# Patient Record
Sex: Male | Born: 1945 | Race: White | Hispanic: No | Marital: Married | Smoking: Former smoker
Health system: Southern US, Academic
[De-identification: ages and names within clinical notes are randomized; demographics above are authoritative.]

## PROBLEM LIST (undated history)

## (undated) VITALS — BP 125/66 | HR 80 | Temp 98.00000°F | Resp 18 | Ht 72.0 in | Wt 209.0 lb

## (undated) DIAGNOSIS — S065XAA Traumatic subdural hemorrhage with loss of consciousness status unknown, initial encounter: Secondary | ICD-10-CM

## (undated) DIAGNOSIS — E119 Type 2 diabetes mellitus without complications: Secondary | ICD-10-CM

## (undated) DIAGNOSIS — E785 Hyperlipidemia, unspecified: Secondary | ICD-10-CM

## (undated) DIAGNOSIS — F32A Depression, unspecified: Secondary | ICD-10-CM

## (undated) DIAGNOSIS — I10 Essential (primary) hypertension: Secondary | ICD-10-CM

## (undated) DIAGNOSIS — F329 Major depressive disorder, single episode, unspecified: Secondary | ICD-10-CM

## (undated) DIAGNOSIS — S065X9A Traumatic subdural hemorrhage with loss of consciousness of unspecified duration, initial encounter: Secondary | ICD-10-CM

## (undated) DIAGNOSIS — K5730 Diverticulosis of large intestine without perforation or abscess without bleeding: Secondary | ICD-10-CM

## (undated) DIAGNOSIS — R030 Elevated blood-pressure reading, without diagnosis of hypertension: Secondary | ICD-10-CM

## (undated) DIAGNOSIS — D649 Anemia, unspecified: Secondary | ICD-10-CM

## (undated) DIAGNOSIS — I509 Heart failure, unspecified: Secondary | ICD-10-CM

## (undated) DIAGNOSIS — M545 Low back pain, unspecified: Secondary | ICD-10-CM

## (undated) DIAGNOSIS — I872 Venous insufficiency (chronic) (peripheral): Secondary | ICD-10-CM

## (undated) DIAGNOSIS — Z860100 Personal history of colon polyps, unspecified: Secondary | ICD-10-CM

## (undated) DIAGNOSIS — T7840XA Allergy, unspecified, initial encounter: Secondary | ICD-10-CM

## (undated) DIAGNOSIS — R413 Other amnesia: Secondary | ICD-10-CM

## (undated) DIAGNOSIS — J449 Chronic obstructive pulmonary disease, unspecified: Secondary | ICD-10-CM

## (undated) DIAGNOSIS — E669 Obesity, unspecified: Secondary | ICD-10-CM

## (undated) DIAGNOSIS — Z972 Presence of dental prosthetic device (complete) (partial): Secondary | ICD-10-CM

## (undated) DIAGNOSIS — D689 Coagulation defect, unspecified: Secondary | ICD-10-CM

## (undated) DIAGNOSIS — J189 Pneumonia, unspecified organism: Secondary | ICD-10-CM

## (undated) DIAGNOSIS — M1990 Unspecified osteoarthritis, unspecified site: Secondary | ICD-10-CM

## (undated) DIAGNOSIS — F99 Mental disorder, not otherwise specified: Secondary | ICD-10-CM

## (undated) DIAGNOSIS — K219 Gastro-esophageal reflux disease without esophagitis: Secondary | ICD-10-CM

## (undated) DIAGNOSIS — F419 Anxiety disorder, unspecified: Secondary | ICD-10-CM

## (undated) DIAGNOSIS — G9050 Complex regional pain syndrome I, unspecified: Secondary | ICD-10-CM

## (undated) DIAGNOSIS — K08109 Complete loss of teeth, unspecified cause, unspecified class: Secondary | ICD-10-CM

## (undated) DIAGNOSIS — I714 Abdominal aortic aneurysm, without rupture, unspecified: Secondary | ICD-10-CM

## (undated) HISTORY — DX: Gastro-esophageal reflux disease without esophagitis: K21.9

## (undated) HISTORY — DX: Obesity, unspecified: E66.9

## (undated) HISTORY — PX: TONSILLECTOMY: SUR1361

## (undated) HISTORY — DX: Personal history of colonic polyps: Z86.010

## (undated) HISTORY — PX: OTHER SURGICAL HISTORY: SHX169

## (undated) HISTORY — DX: Essential (primary) hypertension: I10

## (undated) HISTORY — PX: COLONOSCOPY: SHX174

## (undated) HISTORY — DX: Allergy, unspecified, initial encounter: T78.40XA

## (undated) HISTORY — DX: Hyperlipidemia, unspecified: E78.5

## (undated) HISTORY — DX: Other amnesia: R41.3

## (undated) HISTORY — DX: Low back pain: M54.5

## (undated) HISTORY — PX: KNEE ARTHROSCOPY: SUR90

## (undated) HISTORY — DX: Low back pain, unspecified: M54.50

## (undated) HISTORY — DX: Diverticulosis of large intestine without perforation or abscess without bleeding: K57.30

## (undated) HISTORY — PX: COLONOSCOPY W/ POLYPECTOMY: SHX1380

## (undated) HISTORY — DX: Personal history of colon polyps, unspecified: Z86.0100

## (undated) HISTORY — DX: Elevated blood-pressure reading, without diagnosis of hypertension: R03.0

## (undated) HISTORY — DX: Heart failure, unspecified: I50.9

## (undated) HISTORY — DX: Chronic obstructive pulmonary disease, unspecified: J44.9

## (undated) HISTORY — PX: BACK SURGERY: SHX140

## (undated) HISTORY — DX: Coagulation defect, unspecified: D68.9

## (undated) HISTORY — DX: Anemia, unspecified: D64.9

## (undated) HISTORY — DX: Venous insufficiency (chronic) (peripheral): I87.2

## (undated) HISTORY — DX: Unspecified osteoarthritis, unspecified site: M19.90

## (undated) HISTORY — PX: PAIN PUMP IMPLANTATION: SHX330

## (undated) HISTORY — DX: Traumatic subdural hemorrhage with loss of consciousness status unknown, initial encounter: S06.5XAA

## (undated) HISTORY — DX: Traumatic subdural hemorrhage with loss of consciousness of unspecified duration, initial encounter (CMS HCC): S06.5X9A

## (undated) HISTORY — PX: HX APPENDECTOMY: SHX54

## (undated) HISTORY — DX: Depression, unspecified: F32.A

## (undated) HISTORY — PX: HX CERVICAL SPINE SURGERY: 2100001197

## (undated) HISTORY — DX: Type 2 diabetes mellitus without complications (CMS HCC): E11.9

---
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
## 2000-11-09 HISTORY — PX: OTHER SURGICAL HISTORY: SHX169

## 2000-11-15 ENCOUNTER — Encounter: Payer: Self-pay | Admitting: General Surgery

## 2000-11-15 ENCOUNTER — Encounter: Admission: RE | Admit: 2000-11-15 | Discharge: 2000-11-15 | Payer: Self-pay | Admitting: General Surgery

## 2000-11-16 ENCOUNTER — Encounter (INDEPENDENT_AMBULATORY_CARE_PROVIDER_SITE_OTHER): Payer: Self-pay | Admitting: *Deleted

## 2000-11-16 ENCOUNTER — Ambulatory Visit (HOSPITAL_BASED_OUTPATIENT_CLINIC_OR_DEPARTMENT_OTHER): Admission: RE | Admit: 2000-11-16 | Discharge: 2000-11-16 | Payer: Self-pay | Admitting: General Surgery

## 2001-07-09 ENCOUNTER — Encounter: Payer: Self-pay | Admitting: Emergency Medicine

## 2001-07-09 ENCOUNTER — Emergency Department (HOSPITAL_COMMUNITY): Admission: EM | Admit: 2001-07-09 | Discharge: 2001-07-09 | Payer: Self-pay | Admitting: Emergency Medicine

## 2002-05-12 HISTORY — PX: OTHER SURGICAL HISTORY: SHX169

## 2002-05-29 ENCOUNTER — Encounter: Payer: Self-pay | Admitting: Specialist

## 2002-05-30 ENCOUNTER — Inpatient Hospital Stay (HOSPITAL_COMMUNITY): Admission: AD | Admit: 2002-05-30 | Discharge: 2002-05-31 | Payer: Self-pay | Admitting: Specialist

## 2002-07-22 ENCOUNTER — Encounter: Payer: Self-pay | Admitting: Specialist

## 2002-07-22 ENCOUNTER — Encounter: Admission: RE | Admit: 2002-07-22 | Discharge: 2002-07-22 | Payer: Self-pay | Admitting: Specialist

## 2003-01-08 ENCOUNTER — Encounter: Admission: RE | Admit: 2003-01-08 | Discharge: 2003-01-08 | Payer: Self-pay | Admitting: Neurosurgery

## 2005-04-25 ENCOUNTER — Ambulatory Visit: Payer: Self-pay | Admitting: Internal Medicine

## 2005-04-28 ENCOUNTER — Encounter (INDEPENDENT_AMBULATORY_CARE_PROVIDER_SITE_OTHER): Payer: Self-pay | Admitting: *Deleted

## 2005-04-28 ENCOUNTER — Ambulatory Visit: Payer: Self-pay | Admitting: Internal Medicine

## 2005-05-09 ENCOUNTER — Ambulatory Visit: Payer: Self-pay | Admitting: Pulmonary Disease

## 2005-08-26 ENCOUNTER — Ambulatory Visit: Payer: Self-pay | Admitting: Pulmonary Disease

## 2005-12-27 ENCOUNTER — Ambulatory Visit: Payer: Self-pay | Admitting: Pulmonary Disease

## 2006-03-23 ENCOUNTER — Emergency Department (HOSPITAL_COMMUNITY): Admission: EM | Admit: 2006-03-23 | Discharge: 2006-03-23 | Payer: Self-pay | Admitting: Emergency Medicine

## 2006-04-09 ENCOUNTER — Emergency Department (HOSPITAL_COMMUNITY): Admission: EM | Admit: 2006-04-09 | Discharge: 2006-04-09 | Payer: Self-pay | Admitting: Emergency Medicine

## 2006-04-17 ENCOUNTER — Ambulatory Visit: Payer: Self-pay | Admitting: Pulmonary Disease

## 2006-05-12 HISTORY — PX: OTHER SURGICAL HISTORY: SHX169

## 2006-05-17 ENCOUNTER — Emergency Department (HOSPITAL_COMMUNITY): Admission: EM | Admit: 2006-05-17 | Discharge: 2006-05-18 | Payer: Self-pay | Admitting: Emergency Medicine

## 2006-05-22 ENCOUNTER — Ambulatory Visit: Payer: Self-pay | Admitting: Pulmonary Disease

## 2006-05-25 ENCOUNTER — Inpatient Hospital Stay (HOSPITAL_COMMUNITY): Admission: RE | Admit: 2006-05-25 | Discharge: 2006-05-26 | Payer: Self-pay | Admitting: Neurosurgery

## 2006-06-05 ENCOUNTER — Ambulatory Visit: Payer: Self-pay | Admitting: Pulmonary Disease

## 2006-06-05 LAB — CONVERTED CEMR LAB
Albumin: 3.4 g/dL — ABNORMAL LOW (ref 3.5–5.2)
Alkaline Phosphatase: 86 units/L (ref 39–117)
Basophils Relative: 0.5 % (ref 0.0–1.0)
Bilirubin, Direct: 0.1 mg/dL (ref 0.0–0.3)
CO2: 35 meq/L — ABNORMAL HIGH (ref 19–32)
Calcium: 9.5 mg/dL (ref 8.4–10.5)
Glucose, Bld: 107 mg/dL — ABNORMAL HIGH (ref 70–99)
HCT: 38.6 % — ABNORMAL LOW (ref 39.0–52.0)
Hemoglobin: 12.9 g/dL — ABNORMAL LOW (ref 13.0–17.0)
MCV: 83.4 fL (ref 78.0–100.0)
Monocytes Absolute: 0.7 10*3/uL (ref 0.2–0.7)
Neutro Abs: 5.8 10*3/uL (ref 1.4–7.7)
Neutrophils Relative %: 60.1 % (ref 43.0–77.0)
Platelets: 336 10*3/uL (ref 150–400)
RBC: 4.63 M/uL (ref 4.22–5.81)
RDW: 13.6 % (ref 11.5–14.6)
Sodium: 144 meq/L (ref 135–145)
Total Protein: 7.2 g/dL (ref 6.0–8.3)

## 2006-06-27 ENCOUNTER — Ambulatory Visit: Payer: Self-pay | Admitting: Pulmonary Disease

## 2006-08-07 ENCOUNTER — Ambulatory Visit: Payer: Self-pay | Admitting: Pulmonary Disease

## 2006-08-29 ENCOUNTER — Ambulatory Visit: Payer: Self-pay | Admitting: Pulmonary Disease

## 2006-08-29 LAB — CONVERTED CEMR LAB
Alkaline Phosphatase: 87 units/L (ref 39–117)
Bilirubin, Direct: 0.1 mg/dL (ref 0.0–0.3)
Calcium: 9.1 mg/dL (ref 8.4–10.5)
Chloride: 102 meq/L (ref 96–112)
Cholesterol: 190 mg/dL (ref 0–200)
GFR calc non Af Amer: 146 mL/min
Glucose, Bld: 99 mg/dL (ref 70–99)
HDL: 36.4 mg/dL — ABNORMAL LOW (ref >39.0)
LDL Cholesterol: 121 mg/dL — ABNORMAL HIGH (ref 0–99)
Potassium: 4.2 meq/L (ref 3.5–5.1)
Total Protein: 7 g/dL (ref 6.0–8.3)

## 2006-12-20 ENCOUNTER — Ambulatory Visit: Payer: Self-pay | Admitting: Pulmonary Disease

## 2007-01-15 ENCOUNTER — Ambulatory Visit: Payer: Self-pay | Admitting: Pulmonary Disease

## 2007-02-22 DIAGNOSIS — E782 Mixed hyperlipidemia: Secondary | ICD-10-CM

## 2007-02-22 DIAGNOSIS — M545 Low back pain, unspecified: Secondary | ICD-10-CM | POA: Insufficient documentation

## 2007-02-22 DIAGNOSIS — D126 Benign neoplasm of colon, unspecified: Secondary | ICD-10-CM | POA: Insufficient documentation

## 2007-02-22 DIAGNOSIS — E1169 Type 2 diabetes mellitus with other specified complication: Secondary | ICD-10-CM | POA: Insufficient documentation

## 2007-04-12 HISTORY — PX: OTHER SURGICAL HISTORY: SHX169

## 2007-04-19 ENCOUNTER — Ambulatory Visit (HOSPITAL_COMMUNITY): Admission: RE | Admit: 2007-04-19 | Discharge: 2007-04-19 | Payer: Self-pay | Admitting: Neurosurgery

## 2008-09-10 ENCOUNTER — Ambulatory Visit: Payer: Self-pay | Admitting: Pulmonary Disease

## 2008-09-12 DIAGNOSIS — I872 Venous insufficiency (chronic) (peripheral): Secondary | ICD-10-CM | POA: Insufficient documentation

## 2008-09-12 DIAGNOSIS — K5730 Diverticulosis of large intestine without perforation or abscess without bleeding: Secondary | ICD-10-CM | POA: Insufficient documentation

## 2008-09-24 ENCOUNTER — Ambulatory Visit: Payer: Self-pay | Admitting: Pulmonary Disease

## 2008-11-19 LAB — CONVERTED CEMR LAB
ALT: 25 units/L (ref 0–53)
CO2: 28 meq/L (ref 19–32)
Chloride: 101 meq/L (ref 96–112)
Cholesterol: 224 mg/dL — ABNORMAL HIGH (ref 0–200)
Creatinine, Ser: 0.8 mg/dL (ref 0.4–1.5)
Eosinophils Relative: 3.6 % (ref 0.0–5.0)
HCT: 41.1 % (ref 39.0–52.0)
HDL: 30.9 mg/dL — ABNORMAL LOW (ref >39.00)
Hemoglobin: 14 g/dL (ref 13.0–17.0)
Lymphocytes Relative: 26 % (ref 12.0–46.0)
MCHC: 34.1 g/dL (ref 30.0–36.0)
MCV: 87.8 fL (ref 78.0–100.0)
Monocytes Relative: 4.8 % (ref 3.0–12.0)
PSA: 0.48 ng/mL (ref 0.10–4.00)
Platelets: 184 10*3/uL (ref 150.0–400.0)
Potassium: 4.2 meq/L (ref 3.5–5.1)
RBC: 4.69 M/uL (ref 4.22–5.81)
RDW: 12.6 % (ref 11.5–14.6)
Sodium: 140 meq/L (ref 135–145)
TSH: 3.18 microintl units/mL (ref 0.35–5.50)
Total CHOL/HDL Ratio: 7
VLDL: 36.6 mg/dL (ref 0.0–40.0)
Vit D, 25-Hydroxy: 15 ng/mL — ABNORMAL LOW (ref 30–89)
WBC: 11.3 10*3/uL — ABNORMAL HIGH (ref 4.5–10.5)

## 2009-01-26 ENCOUNTER — Encounter (INDEPENDENT_AMBULATORY_CARE_PROVIDER_SITE_OTHER): Payer: Self-pay | Admitting: *Deleted

## 2009-02-10 ENCOUNTER — Encounter: Payer: Self-pay | Admitting: Pulmonary Disease

## 2009-03-09 ENCOUNTER — Ambulatory Visit: Payer: Self-pay | Admitting: Pulmonary Disease

## 2009-03-09 DIAGNOSIS — J449 Chronic obstructive pulmonary disease, unspecified: Secondary | ICD-10-CM | POA: Insufficient documentation

## 2009-03-10 LAB — CONVERTED CEMR LAB
Albumin: 4 g/dL (ref 3.5–5.2)
BUN: 9 mg/dL (ref 6–23)
CO2: 29 meq/L (ref 19–32)
Creatinine, Ser: 0.7 mg/dL (ref 0.4–1.5)
Hgb A1c MFr Bld: 7.4 % — ABNORMAL HIGH (ref 4.6–6.5)
LDL Cholesterol: 98 mg/dL (ref 0–99)
Potassium: 4.4 meq/L (ref 3.5–5.1)
Sodium: 139 meq/L (ref 135–145)
Total CHOL/HDL Ratio: 4
Total Protein: 7.9 g/dL (ref 6.0–8.3)
Triglycerides: 94 mg/dL (ref 0.0–149.0)

## 2009-07-02 ENCOUNTER — Encounter: Payer: Self-pay | Admitting: Pulmonary Disease

## 2009-07-27 ENCOUNTER — Encounter: Payer: Self-pay | Admitting: Pulmonary Disease

## 2009-09-08 ENCOUNTER — Ambulatory Visit: Payer: Self-pay | Admitting: Pulmonary Disease

## 2009-09-09 LAB — CONVERTED CEMR LAB
ALT: 35 units/L (ref 0–53)
Alkaline Phosphatase: 86 units/L (ref 39–117)
BUN: 8 mg/dL (ref 6–23)
Basophils Absolute: 0 10*3/uL (ref 0.0–0.1)
Basophils Relative: 0.2 % (ref 0.0–3.0)
Bilirubin, Direct: 0.1 mg/dL (ref 0.0–0.3)
CO2: 31 meq/L (ref 19–32)
GFR calc non Af Amer: 124.97 mL/min (ref >60)
HCT: 39.9 % (ref 39.0–52.0)
Hgb A1c MFr Bld: 6.8 % — ABNORMAL HIGH (ref 4.6–6.5)
Lymphocytes Relative: 30.3 % (ref 12.0–46.0)
Monocytes Relative: 5.5 % (ref 3.0–12.0)
Neutro Abs: 5.4 10*3/uL (ref 1.4–7.7)
Neutrophils Relative %: 61.6 % (ref 43.0–77.0)
PSA: 0.28 ng/mL (ref 0.10–4.00)
Platelets: 234 10*3/uL (ref 150.0–400.0)
RBC: 4.46 M/uL (ref 4.22–5.81)
RDW: 14.1 % (ref 11.5–14.6)
WBC: 8.7 10*3/uL (ref 4.5–10.5)

## 2010-03-08 ENCOUNTER — Ambulatory Visit: Payer: Self-pay | Admitting: Pulmonary Disease

## 2010-03-15 LAB — CONVERTED CEMR LAB
ALT: 31 units/L (ref 0–53)
AST: 59 units/L — ABNORMAL HIGH (ref 0–37)
Bilirubin, Direct: 0.2 mg/dL (ref 0.0–0.3)
Chloride: 97 meq/L (ref 96–112)
Cholesterol: 158 mg/dL (ref 0–200)
GFR calc non Af Amer: 138.8 mL/min (ref >60)
Glucose, Bld: 385 mg/dL — ABNORMAL HIGH (ref 70–99)
Hgb A1c MFr Bld: 10.6 % — ABNORMAL HIGH (ref 4.6–6.5)
Potassium: 4.6 meq/L (ref 3.5–5.1)
Total Bilirubin: 0.7 mg/dL (ref 0.3–1.2)
Total Protein: 7.8 g/dL (ref 6.0–8.3)

## 2010-03-20 DIAGNOSIS — I10 Essential (primary) hypertension: Secondary | ICD-10-CM | POA: Insufficient documentation

## 2010-04-09 ENCOUNTER — Encounter: Payer: Self-pay | Admitting: Internal Medicine

## 2010-05-10 ENCOUNTER — Encounter: Payer: Self-pay | Admitting: Pulmonary Disease

## 2010-05-11 NOTE — Letter (Signed)
Summary: Care Consideration for eye exam/Health Smart  Care Consideration for eye exam/Health Smart   Imported By: Sherian Rein 08/24/2009 11:40:45  _____________________________________________________________________  External Attachment:    Type:   Image     Comment:   External Document

## 2010-05-11 NOTE — Assessment & Plan Note (Signed)
Summary: rov 6 months///kp   CC:  6 month ROV & review of mult medical problems....  History of Present Illness: 65 y/o WM here for a follow up visit... he has multiple medical problems including COPD (former heavy smoker);  HBP;  Hyperlipidemia & DM;  Obesity;  Divertics & Polyps;  LBP, RSD, & chr pain syndrome...   ~  Sep 08, 2009:  last OV his FBS=145 & A1c=7.4 so Glimepiride 1mg  was added to his Metformin Bid... inexplicably he never refilled the Glimep & he never refilled the Simvastatin 40mg  either (he thinks it prob the pharm fault, but I called them & there are refills avail- he just never called for the refills)...  still not really on diet & not able to exercise, weight 270# w/o change...  we decided to recheck fasting blood work & start back on meds as indicated...   **NOTE: we received letter from [facility] program- we will ask them to aide in medication compliance, diet & exercise program...   ~  March 08, 2010: his CC remains his chr pain for which he sees DrRauch pain clinic every month w/ 8 meds and pain pump in spine & TENS implanted as well... he states that his breathing is OK;  notes his BP is only elev when he is in pain & denies CP/ angina etc;  BS & A1c are way out of control & we are adding Glimep + max Metformin;  we reviewed diet + exercise profram & he must lose weight to keep himself from having to start on insulin... OK Flu vaccine today.    Current Problems:   COPD (ICD-496) - former 3ppd smoker, quit 2003... denies cough, phlegm, change in SOB, etc...  ~  CXR 6/10 showed COPD, NAD...  HYPERTENSION, BORDERLINE (ICD-401.9) - his BP has been elev intermittently in the past, but he states just when he is in pain... DrRauck has him on CLONIDINE 0.1mg  Bid which obviously also helps his pressure...  ~  5/11:  BP= 116/70 & he denies HA, visual changes, CP, palipit, syncope, ch in dyspnea, edema, etc...  ~  11/11:  BP= 160/90 but he is having considerable pain  recently he says & he doesn't want additional meds.  VENOUS INSUFFICIENCY (ICD-459.81) - hx chr venous insuffic and cellulitis in the past... no recent soft tissue infections... he follows a low sodium diet, elevates legs, wears support hose when necessary...  HYPERLIPIDEMIA (ICD-272.4) - supposed to be on SIMVASTATIN 40mg /d, but he has been filling this irregularly... prev on Vytorin 10-40 but he is very erratic at filling and taking his medication as well... we discussed low chol/ low fat diet, incr exercise, get weight down!  ~  FLP 5/08 showed TChol 190, TG 164, HDL 36, LDL 121... ?taking Vytor10-40?  ~  FLP 6/10 showed TChol 224, TG 183, HDL 31, LDL 176... rec> Rx Simva40.  ~  FLP 11/10 on Simva40 showed TChol 159, TG 94, HDL 43, LDL 98... continue same.  ~  pt stopped filling his perscription for Simva40  ~1/11...  ~  FLP 5/11 showed TChol 240, TG 205, HDL 35, LDL 178... needs med> restart Simva40 & stay on it!  ~  FLP 11/11 showed TChol 158, TG 181, HDL 32, LDL 90  DIABETES MELLITUS (ICD-250.00) - on METFORMIN 500mg Bid & he has been filling this Rx & taking it more regularly... new Rx for Glimepiride written 11/10 but pt stopped this ?why  ~1/11... we discussed low carb diet,  exercise & wt reduction!  ~  labs 2/08 showed BS= 107, A1c= 6.4  ~  labs 5/08 showed BS= 99, A1c= 6.2  ~  labs 6/10 showed BS= 127, A1c= 6.6.[name]. rec> take med regularly!  ~  labs 11/10 showed BS= 145, A1c= 7.4.[name]. rec> add Glimep 1mg Qam (only took it for 2-46mo).   ~  labs 5/11 showed BS= 118, A1c= 6.8.[name]. if he can lose weight he can avoid more meds...  ~  labs 11/11 showed BS= 385, A1c= 10.6.Marland KitchenMarland Kitchen ?what happened? rec> incr Metform500-2Bid, Add Glimep2mg /d.  OBESITY (ICD-278.00) - he is not dieting/ exercising/ or trying to lose weight in any fashion!!!  We discussed weight reducing diet strategies and exercise program that he should be able to perform.  ~  last weight <230# was 1995 @ 226#... 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  ~  last colonoscopy 1/07 by DrPerry showed divertics, 1mm polyp (not retrieved)... f/u planned 10yrs.  Hx of PROSTATITIS, ACUTE (ICD-601.0) - hx acute prostatitis treated in the 1990's by DrPeterson...  ~  labs 6/10 showed PSA= 0.48  ~  labs 5/11 showed PSA= 0.28  ~  11/11: notes some irritation on penis- Rx Lotrisone cream trial.  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 followed by Vito Berger al at the [facility]: he has a spinal cord stimulator, and subcut pain pump w/ Fentanyl (reaction to MS)...   ~  2/04:  eval by Tora Perches w/ HNP L3-4 right w/ spinal stenosis, lateral recessed stenosis- had microdiscectomy & decompression.  ~  2/08:  Myelogram by DrJJenkins w/ multilevel cervical DDD & stenosis- s/p ant cerv discectomy & fusion w/ plating at C3-4.  ~  6/10: current meds from DrRauch includes--- Percocet 10mg - Tid; Tramadol 50mg - 1 to 2 Qid; Celebrex 200mg Bid; Lidoderm patches; Lyrica 150mg Bid; Cymbalta 60mg /d; Flexeril 10mg Tid; Clonidine0.1mg - 1.2 Bid for RSD...  ~  11/10: pt reports Myelogram & CT scan showed 5 discs & spinal stenosis... he is to see Neurosurg soon.  ~  5/11:  he reports monthly OV's w/ Pain Management & continues on 8 meds listed...  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 Father died age 68 from lung cancer Mother alive age 69 Frederich Montilla) w/ pulm fibrosis, divertics, DJD... 4 Siblings:  Bro= Orion Katrinka Blazing, Sister= [facility]  Social History: Reviewed history from 03/09/2009 and no changes required. Married, wife= Clydie Braun, 38 yrs. 2 Children Ex-smoker, quit 2003 w/ 3ppd habit Social alcohol retired [employer] forces logistics/support/administrative officer Packs/Day:  3.0  Review of Systems      See HPI       The patient complains of dyspnea on exertion, peripheral edema, muscle weakness, and difficulty walking.  The patient denies anorexia, fever, weight loss, weight gain, vision loss, decreased hearing, hoarseness, chest pain, syncope, prolonged cough, headaches, hemoptysis, abdominal pain, melena, hematochezia, severe indigestion/heartburn, hematuria, incontinence, suspicious skin lesions, transient blindness, depression, unusual weight change, abnormal bleeding, enlarged lymph nodes, and angioedema.    Vital Signs:  Patient profile:   65 year old male Height:      67 inches Weight:      262.25 pounds BMI:     41.22 O2 Sat:      95 % on Room  air Temp:     98.6 degrees F oral Pulse rate:   114 / minute BP sitting:   160 / 90  (right arm) Cuff size:   regular  Vitals Entered By: Randell Loop CMA (March 08, 2010 11:39 AM)  O2 Sat at Rest %:  95 O2 Flow:  Room air CC: 6 month ROV & review of mult medical problems... Is Patient Diabetic? Yes Pain Assessment Patient in pain? yes      Onset of pain  severe back pain today Comments no changes in meds today   Physical Exam  Additional Exam:  WD, Obese, 65 y/o WM in NAD... GENERAL:  Alert & oriented; pleasant & cooperative. HEENT:  [place]/AT, EOM-wnl, PERRLA, EACs-clear, TMs-wnl, NOSE-clear, THROAT-clear & wnl. NECK:  Supple w/ decrROM & ant cerv scar; no JVD; normal carotid impulses w/o bruits; no thyromegaly or nodules palpated; no lymphadenopathy. CHEST:  Clear to P & A; without wheezes/ rales/ or rhonchi heard... HEART:  Regular Rhythm; without murmurs/ rubs/ or gallops detected... ABDOMEN:  Obese, soft & nontender; decr bowel sounds; no organomegaly or masses palpated... EXT: without deformities, mild arthritic changes; no varicose veins/ +venous insuffic/ tr edema. NEURO:  CN's intact; motor testing normal; sensory testing normal; abn gait & balance fair... Ambulates w/ cane, scar of prev Lumbar surg... DERM:  Mod lipoma in right antecubital area...    MISC. Report  Procedure date:  03/08/2010  Findings:      DATA REVIEWED:  ~  Old EMR notes reviewed & lab cumulative summary sheet noted...  ~  Labs 03/08/10 reviewed w/ the pt>>>    Impression & Recommendations:  Problem # 1:  COPD (ICD-496) He has a heavy smoking hx but not very symptomatic due to his pain & sedentary nature...  Problem # 2:  HYPERTENSION, BORDERLINE (ICD-401.9) BP is sl elev w/ pain>  on Clonidine & we discussed diet Rx if he wants to avoid additional meds... His updated medication list for this problem includes:    Clonidine Hcl 0.1 Mg Tabs (Clonidine hcl) .[name]... Take 1/2 tablet by  mouth two times a day  Orders: TLB-A1C / Hgb A1C (Glycohemoglobin) (83036-A1C) TLB-Lipid Panel (80061-LIPID) TLB-Hepatic/Liver Function Pnl (80076-HEPATIC)  Problem # 3:  HYPERLIPIDEMIA (ICD-272.4) Encouraged to take med every day, & get on diet + exercise program... His updated medication  list for this problem includes:    Simvastatin 40 Mg Tabs (Simvastatin) .[name]... Take one tablet by mouth at bedtime  Problem # 4:  DIABETES MELLITUS (ICD-250.00) Control is suddenly very poor... his Metformin is incr & Glimep added... His updated medication list for this problem includes:    Metformin Hcl 500 Mg Tabs (Metformin hcl) .[name]... Take 2  tablet by mouth two times a day    Glimepiride 2 Mg Tabs (Glimepiride) .[name]... Take one tablet by mouth every morning  Problem # 5:  OBESITY (ICD-278.00) As noted>  diet + exercise are the keys to success...  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  Problem # 7:  LOW BACK PAIN SYNDROME (ICD-724.2) Followed by DrRauck pain clinic... His updated medication list for this problem includes:    Percocet 10-325 Mg Tabs (Oxycodone-acetaminophen) .[name]... Take one tablet by mouth four time per day    Tramadol Hcl 50 Mg Tabs (Tramadol hcl) .[name]... Take 1 to 2 tablets by mouth four times per day    Celebrex 200 Mg Caps (Celecoxib) .[name]... Take one capsule by mouth two times a day    Cyclobenzaprine Hcl 10 Mg Tabs (Cyclobenzaprine hcl) .[name]... Take one tablet by mouth three times a day  Problem # 8:  Other medical problems as noted... OK lu shot today...  Complete Medication List: 1)  Clonidine Hcl 0.1 Mg Tabs (Clonidine hcl) .... Take 1/2 tablet by mouth two times a day 2)  Metformin Hcl 500 Mg Tabs (Metformin hcl) .... Take 2  tablet by mouth two times a day 3)  Simvastatin 40 Mg Tabs (Simvastatin) .... Take one tablet by mouth at bedtime 4)  Cymbalta 60 Mg Cpep  (Duloxetine hcl) .... Take 1 tablet by mouth once a day 5)  Lyrica 150 Mg Caps (Pregabalin) .... Take 1 tablet by mouth two times a day 6)  Lidoderm 5 % Ptch (Lidocaine) .... Use as directed on for 12 hours, off for 12 hours 7)  Percocet 10-325 Mg Tabs (Oxycodone-acetaminophen) .... Take one tablet by mouth four time per day 8)  Tramadol Hcl 50 Mg Tabs (Tramadol hcl) .... Take 1 to 2 tablets by mouth four times per day 9)  Celebrex 200 Mg Caps (Celecoxib) .... Take one capsule by mouth two times a day 10)  Cyclobenzaprine Hcl 10 Mg Tabs (Cyclobenzaprine hcl) .... Take one tablet by mouth three times a day 11)  Vitamin D 1000 Unit Tabs (Cholecalciferol) .... Take 2 tablets by mouth daily 12)  Clotrimazole-betamethasone 1-0.05 % Crea (Clotrimazole-betamethasone) .... Apply as directed... 13)  Glimepiride 2 Mg Tabs (Glimepiride) .... Take one tablet by mouth every morning  Other Orders: Influenza Vaccine NON MCR (16109) TLB-BMP (Basic Metabolic Panel-BMET) (80048-METABOL)  Patient Instructions: 1)  Today we updated your med list- see below.... 2)  Continue your current meds the same for now... 3)  We did your follow up FASTING blood work today... please call the "phone tree" in a few days for your lab results.Marland KitchenMarland Kitchen 4)  We also gave you the 2011 Flu vaccine... 5)  Keep up the good work w/ weight reduction!!! 6)  Call for any questions.Marland KitchenMarland Kitchen 7)  Please schedule a follow-up appointment in 6 months. Prescriptions: CLOTRIMAZOLE-BETAMETHASONE 1-0.05 % CREA (CLOTRIMAZOLE-BETAMETHASONE) apply as directed...  #1 large tube x 5   Entered and Authorized by:   Michele Mcalpine MD   Signed  by:   Michele Mcalpine MD on 03/08/2010   Method used:   Print then Give to Patient   RxID:   [id number]    Immunizations Administered:  Influenza Vaccine # 1:    Vaccine Type: Fluvax Non-MCR    Site: left deltoid    Mfr: GlaxoSmithKline    Dose: 0.5 ml    Route: IM    Given by: Randell Loop CMA    Exp. Date:  10/09/2010    Lot #: [id number]    VIS given: 11/03/09 version given March 08, 2010.  Flu Vaccine Consent Questions:    Do you have a history of severe allergic reactions to this vaccine? no    Any prior history of allergic reactions to egg and/or gelatin? no    Do you have a sensitivity to the preservative Thimersol? no    Do you have a past history of Guillan-Barre Syndrome? no    Do you currently have an acute febrile illness? no    Have you ever had a severe reaction to latex? no    Vaccine information given and explained to patient? yes

## 2010-05-11 NOTE — Assessment & Plan Note (Signed)
Summary: 6 months/apc   CC:  6 month ROV & review of mult medical problems....  History of Present Illness: 65 y/o WM here for a follow up visit... he has multiple medical problems as noted below...     ~  Jun10:  he is followed regularly in the Pain Clinic by [facility], and doesn't like coming to see so many doctors, he says... last seen her 10/08 & we follow him for DM- supposed to be on Metformin 500Bid, and Hyperchol- supposed to be on Vytorin 10-40... he is not on any kind of appropriate diet despite numerous attempts at diet control in the past (weight up 25# to 278#)... he tells me that he is taking his meds regularly but when I called all the prev pharmacies listed his last refills of these 2 meds [facility] [address]...  his main concern is his chr pain syndrome managed by DrRauck's pain clinic and he sees him every month...  ~  Nov10:  he just had Myel[facility] & he reports 5 discs & spinal stenosis- he is going to a Neurosurg to see if there is anything they can do... still on 8 meds from the pain clinic... he was started on Simva40 after last OV & states taking it regularly, continues on MetforminBid & wt down 5#...    ~  Sep 08, 2009:  last OV his FBS=145 & A1c=7.4 so Glimepiride 1mg  was added to his Metformin Bid... inexplicably he never refilled the Glimep & he never refilled the Simvastatin 40mg  either (he thinks it prob the pharm fault, but I called them & there are refills avail- he just never called for the refills)...  still not really on diet & not able to exercise, weight 270# w/o change...  we decided to recheck fasting blood work & start back on meds as indicated...   **NOTE: we received letter from [facility] program- we will ask them to aide in medication compliance, diet & exercise program...    Current Problems:   COPD (ICD-496) - former 3ppd smoker, quit 2003... denies cough, phlegm, change in SOB, etc...  ~  CXR 6/10 showed COPD,  NAD...  VENOUS INSUFFICIENCY (ICD-459.81) - hx chr venous insuffic and cellulitis in the past... no recent soft tissue infections... he follows a low sodium diet, elevates legs, wears support hose when necessary...  HYPERLIPIDEMIA (ICD-272.4) - supposed to be on SIMVASTATIN 40mg /d, but he hasn't filled this in 4+months per pharmacy phone call today... prev on Vytorin 10-40 but he is very erratic at filling and taking his medication... we discussed low chol/ low fat diet, incr exercise, get weight down!  ~  FLP 5/08 showed TChol 190, TG 164, HDL 36, LDL 121... ?taking Vytor10-40?  ~  FLP 6/10 showed TChol 224, TG 183, HDL 31, LDL 176... rec> Rx Simva40.  ~  FLP 11/10 on Simva40 showed TChol 159, TG 94, HDL 43, LDL 98... continue same.  ~  pt stopped filling his perscription for Simva40  ~1/11...  ~  FLP 5/11 showed TChol 240, TG 205, HDL 35, LDL 178... needs med> restart simva40 & stay on it!  DIABETES MELLITUS (ICD-250.00) - on METFORMIN 500mg Bid & he has been filling this Rx & taking it more regularly... new Rx for Glimepiride written 11/10 but pt stopped this ?why  ~1/11... we discussed low carb diet, exercise & wt reduction!  ~  labs 2/08 showed BS= 107, A1c= 6.4  ~  labs  5/08 showed BS= 99, A1c= 6.2  ~  labs 6/10 showed BS= 127, A1c= 6.6.[name]. rec> take med regularly!  ~  labs 11/10 showed BS= 145, A1c= 7.4.[name]. rec> add Glimep 1mg Qam (only took it for 2-19mo).   ~  labs 5/11 showed BS= 118, A1c= 6.8.[name]. if he can lose weight he can avoid more meds...  OBESITY (ICD-278.00) - he is not dieting/ exercising/ or trying to lose weight in any fashion!!!  We discussed weight reducing diet strategies and exercise program that he should be able to perform.  ~  last weight <230# was 1995 @ 226#... range over 15 yrs= 238-278#  ~  weight 5/08 = 239#  ~  weight 10/08 = 253#  ~  weight 6/10 = 278#  ~  weight 11/10 = 273#  ~  weight 5/11 = 270#  DIVERTICULOSIS OF COLON (ICD-562.10),  COLONIC POLYPS  (ICD-211.3),  CONSTIPATION (ICD-564.00) - he is instructed to take MIRALAX Bid & SENAKOT-S 2Qhs for his narcotic induced constipation problem...  ~  last colonoscopy 1/07 by DrPerry showed divertics, 1mm polyp (not retrieved)... f/u planned 40yrs.  Hx of PROSTATITIS, ACUTE (ICD-601.0) - hx acute prostatitis treated in the 1990's by DrPeterson...  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 followed by Vito Berger al at the [facility]: he has a spinal cord stimulator, and subcut pain pump w/ Fentanyl (reaction to MS)...   ~  2/04:  eval by Tora Perches w/ HNP L3-4 right w/ spinal stenosis, lateral recessed stenosis- had microdiscectomy & decompression.  ~  2/08:  Myelogram by DrJJenkins w/ multilevel cervical DDD & stenosis- s/p ant cerv discectomy & fusion w/ plating at C3-4.  ~  6/10: current meds from DrRauch includes--- Percocet 10mg - Tid; Tramadol 50mg - 1 to 2 Qid; Celebrex 200mg Bid; Lidoderm patches; Lyrica 150mg Bid; Cymbalta 60mg /d; Flexeril 10mg Tid; Clonidine0.1mg - 1.2 Bid for RSD...  ~  11/10: pt reports Myelogram & CT scan showed 5 discs & spinal stenosis... he is to see Neurosurg soon.  ~  5/11:  he reports monthly OV's w/ Pain Management & continues on 8 meds listed...  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   Allergies: 1)  Codeine Phosphate (Codeine Phosphate)  Comments:  Nurse/Medical Assistant: The patient's medications and allergies were reviewed with the patient and were updated in the Medication and Allergy Lists.  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 Father died age 75 from lung cancer Mother alive age 65 Gregroy Dombkowski) w/ pulm fibrosis, divertics, DJD... 4 Siblings:  Bro= Dola Factor, Sister= [facility]...  Social History: Reviewed history from 03/09/2009 and no changes required. Married, wife= Clydie Braun, 38 yrs. 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  The patient denies anorexia, fever, weight loss, weight gain, vision loss, hoarseness, chest pain, syncope, peripheral edema, prolonged cough, hemoptysis, abdominal pain, melena, hematochezia, severe indigestion/heartburn, hematuria, incontinence, muscle weakness, suspicious skin lesions, transient blindness, depression, unusual weight change, abnormal bleeding, enlarged lymph nodes, and angioedema.    Vital Signs:  Patient profile:   65 year old male Height:      67 inches Weight:      270 pounds O2 Sat:      95 % on Room air Temp:     98.0 degrees F oral Pulse rate:   110 / minute BP sitting:   116 / 70  (left arm) Cuff size:   regular  Vitals Entered By: Randell Loop CMA (Sep 08, 2009 11:18 AM)  O2 Sat at Rest %:  95 O2 Flow:  Room air CC: 6 month ROV & review of mult medical problems... Is Patient Diabetic?  No Pain Assessment Patient in pain? yes      Comments meds updated today   Physical Exam  Additional Exam:  WD, Obese, 65 y/o WM in NAD... GENERAL:  Alert & oriented; pleasant & cooperative. HEENT:  [place]/AT, EOM-wnl, PERRLA, EACs-clear, TMs-wnl, NOSE-clear, THROAT-clear & wnl. NECK:  Supple w/ decrROM & ant cerv scar; no JVD; normal carotid impulses w/o bruits; no thyromegaly or nodules palpated; no lymphadenopathy. CHEST:  Clear to P & A; without wheezes/ rales/ or rhonchi heard... HEART:  Regular Rhythm; without murmurs/ rubs/ or gallops detected... ABDOMEN:  Obese, soft & nontender; decr bowel sounds; no organomegaly or masses palpated... EXT: without deformities, mild arthritic changes; no varicose veins/ +venous insuffic/ tr edema. NEURO:  CN's intact; motor testing normal; sensory testing normal; abn gait & balance fair... Ambulates w/ cane, scar of prev Lumbar surg... DERM:  Mod lipoma in right antecubital area...    MISC. Report  Procedure date:  09/08/2009  Findings:      BMP (METABOL)   Sodium                    140 mEq/L                   135-145   Potassium                 5.1 mEq/L                   3.5-5.1   Chloride                  100 mEq/L                   96-112   Carbon Dioxide            31 mEq/L                    19-32   Glucose              [H]  118 mg/dL                   16-10   BUN                       8 mg/dL  6-23   Creatinine                0.7 mg/dL                   6.2-1.3   Calcium                   9.4 mg/dL                   0.8-65.7   GFR                       124.97 mL/min               >60  Hepatic/Liver Function Panel (HEPATIC)   Total Bilirubin      [L]  0.2 mg/dL                   8.4-6.9   Direct Bilirubin          0.1 mg/dL                   6.2-9.5   Alkaline Phosphatase      86 U/L                      39-117   AST                  [H]  47 U/L                      0-37   ALT                       35 U/L                       0-53   Total Protein             7.3 g/dL                    2.8-4.1   Albumin                   4.0 g/dL                    3.2-4.4  CBC Platelet w/Diff (CBCD)   White Cell Count          8.7 K/uL                    4.5-10.5   Red Cell Count            4.46 Mil/uL                 4.22-5.81   Hemoglobin                13.5 g/dL                   01.0-27.2   Hematocrit                39.9 %                      39.0-52.0   MCV                       89.6 fl  78.0-100.0   Platelet Count            234.0 K/uL                  150.0-400.0   Neutrophil %              61.6 %                      43.0-77.0   Lymphocyte %              30.3 %                      12.0-46.0   Monocyte %                5.5 %                       3.0-12.0   Eosinophils%              2.4 %                       0.0-5.0   Basophils %               0.2 %                       0.0-3.0  Comments:      TSH (TSH)   FastTSH                   2.35 uIU/mL                 0.35-5.50  Lipid Panel (LIPID)   Cholesterol          [H]  240 mg/dL                   1-610   Triglycerides        [H]  205.0 mg/dL                 9.6-045.4   HDL                  [L]  09.81 mg/dL                 >19.14  Cholesterol LDL - Direct                             178.6 mg/dL  Hemoglobin N8G (N5A)   Hemoglobin A1C       [H]  6.8 %                       4.6-6.5  Prostate Specific Antigen (PSA)   PSA-Hyb                   0.28 ng/mL                  0.10-4.00   Impression & Recommendations:  Problem # 1:  COPD (ICD-496) Stable from the pulm standpoint but he is way too sedentary & advised to incr exercise, consider water exerc, etc...  Problem # 2:  HYPERLIPIDEMIA (ICD-272.4) ???what happened- he is clueless... only filled the Rx thru fall 2010, then ?forgot?stopped?ignored?what??? I callled Pharm & he had refills on the Rx just never asked to get it refilled... we received  letter from [facility] & we will try to utilize them to help pt remember to fill Rx monthly & take meds daily... for now> recheck FLP off the Simva40> TChol 240, LDL 178> therefore RESTART SIMVA40 & STAY ON THIS MED!!!  Orders: TLB-BMP (Basic Metabolic Panel-BMET) (80048-METABOL) TLB-Hepatic/Liver Function Pnl (80076-HEPATIC) TLB-CBC Platelet - w/Differential (85025-CBCD) TLB-TSH (Thyroid Stimulating Hormone) (84443-TSH) TLB-Lipid Panel (80061-LIPID) TLB-A1C / Hgb A1C (Glycohemoglobin) (83036-A1C) TLB-PSA (Prostate Specific Antigen) (84153-PSA)  Problem # 3:  DIABETES MELLITUS (ICD-250.00) Same here>  he was given Glimepiride 1mg  11/10 but only filled it one time ?why- he has no answer & doesn't recall taking this med (he has no prob recalling his 8 pain clinic meds however)... we decided to recheck his labs today & start over>  BS= 118, A1c= 6.8> therefore if he gets on diet & gets weight down, he might avoid more DM meds... His updated medication list for this problem includes:    Metformin Hcl 500 Mg Tabs (Metformin hcl) .[name]... Take one tablet by mouth two times a day  Problem # 4:  OBESITY (ICD-278.00) Diet/ Exercise/ Weight reduction are key...  Problem # 5:  CONSTIPATION (ICD-564.00) He is rec to take MIRALAX Bid regularly & SENAKOT-S 2 Qhs for his narcotic induced constipation...  Problem # 6:  LOW BACK PAIN SYNDROME (ICD-724.2) As noted>  this is his CC always- follwed by DrRauch pain clinic on 8 diff meds... His updated medication list for this problem includes:    Percocet 10-325 Mg Tabs (Oxycodone-acetaminophen) .[name]... Take one tablet by mouth four time per day    Tramadol Hcl 50 Mg Tabs (Tramadol hcl) .[name]... Take 1 to 2 tablets by mouth four times per day    Celebrex 200 Mg Caps (Celecoxib) .[name]... Take one capsule by mouth two times a day    Cyclobenzaprine Hcl 10 Mg Tabs (Cyclobenzaprine hcl) .[name]... Take one tablet by mouth three times a day  Problem # 7:  OTHER MEDICAL PROBLEMS AS  NOTED>>>  Complete Medication List: 1)  Clonidine Hcl 0.1 Mg Tabs (Clonidine hcl) .... Take 1/2 tablet by mouth two times a day 2)  Metformin Hcl 500 Mg Tabs (Metformin hcl) .... Take one tablet by mouth two times a day 3)  Cymbalta 60 Mg Cpep (Duloxetine hcl) .... Take 1 tablet by mouth once a day 4)  Lyrica 150 Mg Caps (Pregabalin) .... Take 1 tablet by mouth two times a day 5)  Lidoderm 5 % Ptch (Lidocaine) .... Use as directed on for 12 hours, off for 12 hours 6)  Percocet 10-325 Mg Tabs (Oxycodone-acetaminophen) .... Take one tablet by mouth four time per day 7)  Tramadol Hcl 50 Mg Tabs (Tramadol hcl) .... Take 1 to 2 tablets by mouth four times per day 8)  Celebrex 200 Mg Caps (Celecoxib) .... Take one capsule by mouth two times a day 9)  Cyclobenzaprine Hcl 10 Mg Tabs (Cyclobenzaprine hcl) .... Take one tablet by mouth three times a day 10)  Vitamin D 1000 Unit Tabs (Cholecalciferol) .... Take 2 tablets by mouth daily  Patient Instructions: 1)  Today we updated your med list- see below.... 2)  It is unclear why you stopped the Simvastatin cholesterol med, and the Glimepiride diabetic pill >> your pharm says you had refills available to you but you never asked to get them refilled... 3)  For now we will recheck your FASTING blood work OFF these meds and on the Metformin alone... 4)  We  will then call you w/ the results & decide which meds are needed at this time.Marland KitchenMarland Kitchen 5)  Please call for any questions.Marland KitchenMarland Kitchen 6)  Please schedule a follow-up appointment in 6 months. Prescriptions: METFORMIN HCL 500 MG TABS (METFORMIN HCL) take one tablet by mouth two times a day  #180 x 4   Entered and Authorized by:   Michele Mcalpine MD   Signed by:   Michele Mcalpine MD on 09/08/2009   Method used:   [facility]  [address] [id number]       [address]       [place], [place]  [address]       Ph: [phone] or [phone]       Fax: [phone]   RxID:    [id number]

## 2010-05-11 NOTE — Letter (Signed)
Summary: [facility]  [facility]   Imported By: Sherian Rein 07/08/2009 13:24:22  _____________________________________________________________________  External Attachment:    Type:   Image     Comment:   External Document

## 2010-05-13 NOTE — Letter (Signed)
Summary: Colonoscopy Letter  [facility]  [address] [place], [place] [address]   Phone: [phone]  Fax: [phone]      April 09, 2010 MRN: [id number]   [facility] [address] [place], [place]  [address]   Dear Mr. Weisbecker,   According to your medical record, it is time for you to schedule a Colonoscopy. The American Cancer Society recommends this procedure as a method to detect early colon cancer. Patients with a family history of colon cancer, or a personal history of colon polyps or inflammatory bowel disease are at increased risk.  This letter has been generated based on the recommendations made at the time of your procedure. If you feel that in your particular situation this may no longer apply, please contact our office.  Please call our office at [phone] to schedule this appointment or to update your records at your earliest convenience.  Thank you for cooperating with [place] to provide you with the very best care possible.   Sincerely,  Wilhemina Bonito. Marina Goodell, M.D.  [facility] [phone]

## 2010-06-01 ENCOUNTER — Other Ambulatory Visit (HOSPITAL_COMMUNITY): Payer: Self-pay | Admitting: Neurosurgery

## 2010-06-01 DIAGNOSIS — M5412 Radiculopathy, cervical region: Secondary | ICD-10-CM

## 2010-06-01 DIAGNOSIS — M542 Cervicalgia: Secondary | ICD-10-CM

## 2010-06-08 NOTE — Letter (Signed)
Summary: [facility]  [facility]   Imported By: Sherian Rein 06/01/2010 11:32:47  _____________________________________________________________________  External Attachment:    Type:   Image     Comment:   External Document

## 2010-06-18 ENCOUNTER — Ambulatory Visit (HOSPITAL_COMMUNITY)
Admission: RE | Admit: 2010-06-18 | Discharge: 2010-06-18 | Disposition: A | Discharge status: Home / Self Care | Payer: BC Managed Care – PPO | Source: Ambulatory Visit | Attending: Neurosurgery | Admitting: Neurosurgery

## 2010-06-18 DIAGNOSIS — M5412 Radiculopathy, cervical region: Secondary | ICD-10-CM

## 2010-06-18 DIAGNOSIS — M542 Cervicalgia: Secondary | ICD-10-CM

## 2010-06-18 DIAGNOSIS — M5020 Other cervical disc displacement, unspecified cervical region: Secondary | ICD-10-CM | POA: Insufficient documentation

## 2010-06-18 DIAGNOSIS — M4802 Spinal stenosis, cervical region: Secondary | ICD-10-CM | POA: Insufficient documentation

## 2010-06-18 DIAGNOSIS — M5030 Other cervical disc degeneration, unspecified cervical region: Secondary | ICD-10-CM | POA: Insufficient documentation

## 2010-06-18 MED ORDER — IOHEXOL 300 MG/ML  SOLN: 10.0000 mL | Freq: Once | INTRAMUSCULAR | Status: DC | PRN

## 2010-08-24 NOTE — Letter (Signed)
December 20, 2006     RE:  IZZAC, [name]  MRN:  [id number]  /  DOB:  1945/10/29   To Whom It May Concern:   Mr. James Parsons is a 65 year old gentleman who I follow for general  medical purposes.  He has a history of severe back pain and has been  evaluated by numerous neurologists and neurosurgeons.  He has a chronic  pain syndrome which is managed by Dr. Eloisa Northern in [place],  [place].   It has come to my attention that [name] chronic pain pump which  delivers Dilaudid in a pre-prescribed dose is no longer effective.  I  would support the change in his pain pump medication to fentanyl to see  if this medication will work better for him.  He is severely limited by  his chronic pain syndrome.   Thank you very much for your consideration in this matter.  If I can be  of further assistance, please feel free to contact my office.    Sincerely,      Lonzo Cloud. Kriste Basque, MD  Electronically Signed    SMN/MedQ  DD: 12/20/2006  DT: 12/21/2006  Job #: [id number]

## 2010-08-27 NOTE — H&P (Signed)
James Parsons, James Parsons                           ACCOUNT NO.:  [id number]   MEDICAL RECORD NO.:  [id number]                   PATIENT TYPE:   LOCATION:                                       FACILITY:   PHYSICIAN:  Jene Every, M.D.                 DATE OF BIRTH:  [birth year]   DATE OF ADMISSION:  05/29/2002  DATE OF DISCHARGE:                                HISTORY & PHYSICAL   CHIEF COMPLAINT:  Right lower extremity pain.   HISTORY OF PRESENT ILLNESS:  The patient is a 65 year old gentleman who was  referred to our office by [employer] for evaluation of a  possible HNP.  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  The patient was initially seen at  [facility] and treated conservatively but had persistent  symptoms and positive neurotension signs.  He underwent an MRI in March  which showed a moderate disk herniation at L3-4 and mild spondylosis.  The  patient continued to be treated conservatively and worsened over that period  of time.  He underwent a repeat MRI on March 29, 2002 which showed an  increasing large central disk herniation at 3-4.  He is reporting his pain  is disabling in nature.  On physical exam in our office, the patient is  tender along the right posterior superior iliac spine and sciatic notch and  he has pain with forward flexion and extension.  Straight leg raise on the  right produces buttock, posterior thigh, and calf pain which is exacerbated  with dorsal augmentation maneuver.  Contralateral straight leg raise  produces buttocks pain on the left and on the right.  Sensation is decreased  in the L-4 dermatome.  He has slightly diminished quadriceps strength and  slightly diminished quadriceps reflex.  Due to the results of the patient's  MRI and findings on physical exam, it is felt that the patient would benefit  from a lumbar decompression at L3-4.  The risks and benefits of  the surgery  were discussed with the patient and he wishes to proceed.   MEDICAL HISTORY:  None.   PAST SURGICAL HISTORY:  1. Right knee arthroscopy in 1989.  2. Removal of knots on right elbow in 2003.  3. Tonsillectomy as a child.   MEDICATIONS:  1. Wellbutrin 150 mg one p.o. b.i.d. for smoking cessation.  2. Vicodin p.r.n. pain.  3. Vioxx 50 mg p.r.n.   ALLERGIES:  CODEINE causes the patient to itch; however, he has tolerated  Vicodin well.   SOCIAL HISTORY:  The patient is married.  He currently smokes two packs of  cigarettes per day.  He denies any alcohol intake.  He lives in a [place]  home.   FAMILY HISTORY:  Father deceased of lung cancer.  Otherwise,  noncontributory.   REVIEW OF SYSTEMS:  GENERAL:  The patient denies fever, chills, night  sweats, or bleeding tendencies.  CNS:  No blurred or double vision, seizure,  headache, or paralysis.  RESPIRATORY:  No shortness of breath, productive  cough or hemoptysis.  CARDIOVASCULAR:  No chest pain, angina, or orthopnea.  GI:  No nausea, vomiting, diarrhea, constipation, melena, or bloody stools.  GU:  No dysuria, hematuria, or discharge.  MUSCULOSKELETAL:  Pertinent to  the HPI.   PHYSICAL EXAMINATION:  VITAL SIGNS:  Pulse 98, respiratory rate 16, blood  pressure 140/96.  GENERAL:  This is a well-developed, well-nourished 65 year old gentleman who  is in mild distress.  He walks with an antalgic gait.  HEENT:  Atraumatic, normocephalic.  Pupils equal, round, and reactive to  light.  EOMs intact.  NECK:  Supple, no lymphadenopathy.  CHEST:  Clear to auscultation bilaterally.  No rhonchi, wheezes, or rales.  BREAST AND GENITOURINARY:  Not examined, not pertinent to HPI.  HEART:  Regular rate and rhythm without murmurs, gallops, or rub.  ABDOMEN:  Soft, nontender, nondistended.  Bowel sounds x4.  SKIN:  No rashes or lesions are noted.  LUMBAR SPINE:  The patient has pain on forward flexion and extension to the  right  buttocks.  He has straight leg raise on the right but he has buttock,  posterior thigh, and calf pain which is exacerbated with dorsal augmentation  maneuver.  Contralateral straight leg raise produces buttock pain on the  left and on the right.  Sensation is decreased in the L4 dermatome with  slightly diminished quadriceps strength and slightly diminished quadriceps  reflex.   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   PLAN:  The patient will be admitted to [facility] on May 29, 2002 to undergo a lumbar decompression at L3-4 by Dr. Jene Every.     Roma Schanz, P.A.                   Jene Every, M.D.    CS/MEDQ  D:  05/21/2002  T:  05/21/2002  Job:  [id number]

## 2010-08-27 NOTE — Assessment & Plan Note (Signed)
[facility]                             PULMONARY OFFICE NOTE   COURAGE, [place]                      MRN:          [id number]  DATE:08/07/2006                            DOB:          05/03/1945    HISTORY OF PRESENT ILLNESS:  The patient is a 65 year old white male  patient of Dr. Kriste Basque, who has a known history of chronic back pain  secondary to an injury in 2003 and is followed at the pain clinic at  [facility].  The patient has an implantable spinal cord stimulator  along with a morphine pump and uses oxycodone for breakthrough pain.  The patient presents today and complains that 4 weeks ago that he fell  while going to the bathroom and fell up against a night stand.  The  patient has been having intermittent left-sided and posterior back pain  that is worsened with movement, inspiration.  The patient has also noted  a slight increase in cough and congestion over the last 2 days.  The  patient denies any hemoptysis, orthopnea, PND, leg swelling, fever or  purulent sputum.   PAST MEDICAL HISTORY:  Reviewed.   CURRENT MEDICATIONS:  Reviewed.   PHYSICAL EXAMINATION:  GENERAL:  The patient is a fairly elderly male in  no acute distress.  VITAL SIGNS:  He has stable vital signs.  Pulse recheck is 84.  O2  saturation is 97% on room air.  HEENT:  Unremarkable.  NECK:  Supple without cervical adenopathy.  No JVD.  LUNGS:  Lung sounds reveal diminished breath sounds in the bases,  otherwise clear.  CARDIAC:  A regular rate and rhythm.  ABDOMEN:  Soft and nontender.  EXTREMITIES:  Warm without any calf tenderness, cyanosis, clubbing.  There is trace edema.  Negative Homans' sign.  MUSCULOSKELETAL:  The patient has tenderness on the left posterior  thoracic and lateral side.  SKIN:  No ecchymosis, rash or redness is noted.  NEUROLOGIC:  The patient is alert and oriented x3.  The patient has  decreased strength along the right side; however,  this is chronic in  nature.   DATA:  Chest x-ray today is without significant change.   IMPRESSION AND PLAN:  1. Left posterior back pain secondary to fall 4 weeks ago.  Suspect      this is musculoskeletal in nature.  The patient is to continue with      his pain regimen and may add in Skelaxin 800 mg every 8 hours as      needed.  If pain continues, the patient will need to follow up with      his orthopedist and/or neurologist at the pain clinic.  2. Cough and congestion.  Suspect a mild upper respiratory infection.      The patient is to add in      Mucinex DM twice daily.  If symptoms do not improve, he is to      contact our office for further follow-up.      Rubye Oaks, NP  Electronically Signed      Lonzo Cloud. Kriste Basque, MD  Electronically Signed   TP/MedQ  DD: 08/07/2006  DT: 08/08/2006  Job #: [id number]

## 2010-08-27 NOTE — Op Note (Signed)
NAME:  James Parsons, James Parsons                         ACCOUNT NO.:  [id number]   MEDICAL RECORD NO.:  [id number]                   PATIENT TYPE:  OIB   LOCATION:  2861                                 FACILITY:  [facility]   PHYSICIAN:  Jene Every, M.D.                 DATE OF BIRTH:  15-Apr-1945   DATE OF PROCEDURE:  05/29/2002  DATE OF DISCHARGE:                                 OPERATIVE REPORT   PREOPERATIVE DIAGNOSES:  Spinal stenosis, lateral recessed stenosis and  herniated nucleus pulposus at L3-4, right.   POSTOPERATIVE DIAGNOSES:  Spinal stenosis, lateral recessed stenosis and  herniated nucleus pulposus at L3-4, right.   PROCEDURE:  Lateral recessed decompression, partial medial hemifacetectomy,  foraminotomy, decompression of the L3 and L4 nerve root, microdiskectomy.   ANESTHESIA:  General.   ASSISTANT:  Roma Schanz, P.A.   BRIEF HISTORY:  A 65 year old with right lower extremity radicular pain  secondary to the above mentioned pathology. Operative intervention was  indicated for decompression of the 3 and 4 nerve roots by lateral recessed  decompression, microdiskectomy, facet hypertrophy, and ligamentum flavum,  hypertrophy contributing to the lateral recess. The risks and benefits were  discussed including bleeding, infection, damage to neurovascular structures,  CSF leakage, epidural fibrosis, adjacent segment disease, etc.   TECHNIQUE:  The patient in the supine position after adequate level of  general anesthesia and 1 gm of Kefzol, he was placed prone on the [place]  frame, all bony prominences well padded. The lumbar region was prepped and  draped in the usual sterile fashion. Two 18 gauge spinal needles were  utilized to localize the 3-4 interspace confirmed with x-ray. An incision  was made from the spinous process of 3 to 4, subcutaneous tissue was  dissected, electrocautery utilized to achieve hemostasis. The dorsolumbar  fascia was identified and divided  in line with the skin incision,  paraspinous muscles elevated from the lamina of 3 and 4. McCullough  retractors placed, operating microscope draped and brought onto the surgical  field. A second confirmatory radiograph was obtained confirming the 3-4  interspace. A 2 and a 3 mm Kerrison were utilized to perform a  hemilaminotomy over the caudad edge of 3 and then perform a partial medial  hemifacetectomy utilizing a 2 and a 3 mm Kerrison with the neural elements  well protected. Ligamentum flavum was detached from the cephalad edge of 4  utilizing a straight curette. Foraminotomy of 3 was performed and seen and  the lateral recess stenosis noted. This was decompressed out to the pedicle  and performed a 3 foraminotomy. There was extensive vascular plexus and  bipolar electrocautery was utilized to achieve hemostasis. A large HNP was  noted decompressing centrally and to the lateral side. Without traction of  the thecal sac from the lateral aspect, I performed an annulotomy and  removed a copious portion of the disk material and three large fragments  from the subannular space. Following this, I performed a thorough  diskectomy. This was further mobilized with an Epstein curette. Following  this, I placed the probe beneath the space of 4 and of 3, they were widely  patent without residual compression noted. There were __________ spurs noted  bilaterally, hypertrophic ligamentum and was well decompressed. The disk  space was copiously irrigated with antibiotic irrigation, electrocautery was  utilized to achieve strict hemostasis. No evidence of CSF leakage or active  bleeding, placed thrombin soaked Gelfoam in the laminotomy defect, removed  the McCullough retractor. Paraspinous muscle was inspected with no evidence  of active bleeding. The dorsal lumbar fascia was reapproximated with #1  Vicryl interrupted figure-of-eight suture, the subcutaneous tissue was  reapproximated with 2-0 Vicryl  simple sutures. The skin was reapproximated  with staples, wound was dressed sterilely. He was placed supine on the  hospital bed, extubated without difficulty and transported to the recovery  room in satisfactory condition.   The patient tolerated the procedure well with no complications.                                               Jene Every, M.D.    Cordelia Pen  D:  05/29/2002  T:  05/29/2002  Job:  [id number]

## 2010-08-27 NOTE — Op Note (Signed)
James Parsons, James Parsons               ACCOUNT NO.:  [id number]   MEDICAL RECORD NO.:  [id number]          PATIENT TYPE:  INP   LOCATION:  3014                         FACILITY:  [facility]   PHYSICIAN:  Cristi Loron, M.D.DATE OF BIRTH:  Jan 03, 1946   DATE OF PROCEDURE:  05/25/2006  DATE OF DISCHARGE:                               OPERATIVE REPORT   BRIEF HISTORY:  The patient is a 65 year old white male who has become  progressively myelopathic.  He was worked up with a cervical myelo-CT  (the patient had spinal cord stimulator in place back and can not get an  MR scan).  This demonstrated the patient had multilevel lumbar  spondylosis and stenosis, but relatively mild at all levels except at C3-  4 where he had severe stenosis.  I discussed the various treatment  options with the patient and recommended he undergo anterior cervical  diskectomy, fusion,  plating at C3-4 to decompress the spinal cord.  The  patient and his wife have weighed the risks, benefits and alternate to  surgery and decided to proceed with that operation.   PREOPERATIVE DIAGNOSIS:  C3-4 extensive anterior cervical  diskectomy/decompression; C3-4 anterior interbody arthrodesis with local  autograft bone and bone graft extender; insertion of C3-4 interbody  prosthesis (Alphatec medium PEEK interbody prosthesis); anterior  cervical plating at C3-4 with Codman slim lock titanium plate and  screws.   SURGEON:  Cristi Loron, M.D.   ASSISTANT:  Hewitt Shorts, M.D.   ANESTHESIA:  General endotracheal.   ESTIMATED BLOOD LOSS:  50 mL.   SPECIMENS:  None.   DRAINS:  None.   COMPLICATIONS:  None.   DESCRIPTION OF PROCEDURE:  The patient was brought to the operating room  by the anesthesia team.  General endotracheal anesthesia was induced.  The patient remained in the supine position.  A roll was placed under  his shoulders to place his neck in slight extension.  His anterior  cervical region was then  prepared with Betadine scrub and Betadine  solution.  Sterile drapes were applied.  I then injected the area to be  incised with Marcaine with epinephrine solution.  I used a scalpel to  make a transverse incision in the patient's left anterior neck.  I used  the Metzenbaum scissors to divide the platysma muscle and then to  dissect medial to the sternocleidomastoid muscle, jugular vein and  carotid artery.  I carefully dissected down towards the anterior  cervical spine, identifying the esophagus and retracting it medially.  I  then used the Kitner swabs to clear the soft tissue from the anterior  cervical spine and inserted a bent spinal needle into the exposed  intervertebral interspace.  We then obtained intraoperative radiograph  to confirm our location.  I then used electrocautery to detach the  medial border of the longus colli muscle from the C3-4 intervertebral  disk space.  We then inserted a Caspar self-retaining retractor for  exposure.  We then incised the C3-4 intervertebral disk with a 15 blade  scalpel.  We performed a partial intervertebral diskectomy with  pituitary forceps  and the Carlens curets.  We inserted distraction  screws at C3 and C4 and distracted the intervertebral disk space.  I  then used a high speed drill to decorticate vertebral end plates at Z6-1  , drill away the remainder of C3-4 intervertebral disk to drill away  some posterior spondylosis and to thin out the posterior longitudinal  ligament.  I then incised the thinned out ligament with the arachnoid  knife and then removed it with the Kerrison punch undercutting the  vertebral end plates at W9-6 decompressing the thecal sac.  We performed  foraminotomy about the bilateral C4 nerve root, completing the  decompression at this level.   We now turned our attention to arthrodesis.  We used the trial spacers  and determined to use a 7 mm PEEK medium interbody spacer.  We prefilled  this spacer with local  autograft bone we obtained during the  decompression and Alphatec bone graft extender.  We then inserted this  prosthesis into the distracted C3-4 interspace and then removed the  distraction screws.  There was a good snug fit of the prosthesis.  This  completed the arthrodesis/insertion of the prosthesis.   We now turned our attention to the anterior spinal instrumentation.  We  used the high speed drill to remove some ventral spondylosis from the  vertebral end plates of E4-5 so that the plate would lie down flat.  We  then selected the appropriate length Codman slim lock anterior cervical  plate and laid it along the anterior aspect of the vertebral bodies at  C3-4.  We then drilled two 14 mm holes at C3 to C4 and then secured the  plate to the vertebral bodies by placing two 14 mm self tapping screws  at C3, two at C4.  We obtained an intraoperative radiograph that  demonstrated good position of the plates, screws and interbody  prosthesis.  We therefore secured these screws to the plate by locking  each cam.  This completed the instrumentation.   We then obtained hemostasis using bipolar electrocautery.  We irrigated  the wound out with bacitracin solution, removed retractor.  We then  inspected the esophagus for any damage.  There was none apparent.  We  then reapproximated the patient's platysma muscle with interrupted 3-0  Vicryl sutures, the subcutaneous tissues with interrupted 3-0 Vicryl  suture and the skin with Steri-Strips and benzoin.  The wound was then  coated with bacitracin ointment.  A dry sterile dressing was applied.  The drapes were removed.  The patient was subsequently extubated by the  anesthesia team and transported to the post anesthesia care unit in  stable condition.  All sponge, instrument and needle counts were correct  at the end of this case.      Cristi Loron, M.D.  Electronically Signed    JDJ/MEDQ  D:  05/25/2006  T:  05/26/2006  Job:   [id number]

## 2010-08-27 NOTE — Assessment & Plan Note (Signed)
[facility]                                 ON-CALL NOTE   James Parsons, James Parsons                        MRN:          [id number]  DATE:04/09/2006                            DOB:          06-16-1945    DATE/TIME/PHONE:  April 09, 2006/10:30 a.m./[phone].   PATIENT'S DOCTOR:  Alroy Dust.   This gentleman called complaining of left side pain, shortness of  breath, and throat closing up.  He states he was in the emergency room  several weeks ago with a fall but does not think is related.  He is  short of breath and notes his throat closing up.  He did not feel that  an antibiotic called in would resolve the problem and he was instructed  to go to the emergency room for evaluation.   He has a history of chronic low back pain, on multiple pain medications,  narcotics and cord stimulator.  We will follow up with the emergency  room physician once he has evaluated the patient.     James Parsons. Kriste Basque, MD  Electronically Signed    SMN/MedQ  DD: 04/09/2006  DT: 04/09/2006  Job #: [id number]

## 2010-08-27 NOTE — Op Note (Signed)
[place]  Patient:    James Parsons, James Parsons                      MRN: [id number] Proc. Date: 11/16/00 Adm. Date:  [id number] Attending:  Henrene Dodge                           Operative Report  PREOPERATIVE DIAGNOSIS:  Lipoma/bursa right olecranon greater than 6-7-8 cm.  OPERATION:  Excision of lipoma right olecranon.  ANESTHESIA:  Local anesthesia with MAC, ______ tube.  SURGEON:  Anselm Pancoast. Zachery Dakins, M.D.  HISTORY:  James Parsons is a 65 year old Caucasian male, who I saw in the office recently with a large mass in the olecranon area that has been gradually increasing in size.  The referring physician had thought this was a lipoma.  I was not as sure that it could not be possibly a big olecranon bursa, but I recommended we excise it.  It was certainly too large and down right on the ulnar nerve with sometimes pain radiating to his arm of doing it with local.  I recommended that we do it with local and MAC and the patient was in agreement.  DESCRIPTION OF PROCEDURE:  The patient was taken to the operative suite, given a g of Kefzol, IV had been started, and sedation administered.  We then prepped the right hand and arm with Betadine surgical scrub and solution and draped it in a sterile manner including a stockinette.  I then with the arm extended sort of flexed, a little reflected upward could make the incision sort of in the medial aspect over the mass and I anesthetized circumferentially and then right at the skin area with 0.5% plain Marcaine. Sharp dissection through the skin, immediate subcutaneous tissue, and then this appeared to be a lipoma, but the actual synovial type covering of this I completely excised this and it did go right down to the ulnar notch of the ulnar area.  At this location, we were very careful to be meticulous with the hemostasis where the blood vessels were coming out, but not doing any stitching that  would involve the ulnar nerve.  Good hemostasis was obtained and the mass was excised completely and then carefully checking the wound, we cauterized any little capillary bleeders.  I then closed the transverse incision with 4-0 Vicryl sutures subcuticulous and then 4-0 nylon skin simple sutures.  A Kerlix plus fluff and 4 x 4s were placed for the dressing and the procedure terminated.  The patient tolerated the procedure nicely.  He will probably have some numbness in his hand in the ulnar nerve distribution because of the local anesthetic for a few hours, but should have no deficit. There was a little nerve lateral that was protected under visualization.  This was a cutaneous nerve, not one of the major nerves. DD:  11/16/00 TD:  11/16/00 Job: [id number]

## 2010-08-27 NOTE — Assessment & Plan Note (Signed)
 [facility]                             PULMONARY OFFICE NOTE   James Parsons, James Parsons                      MRN:          [id number]  DATE:05/22/2006                            DOB:          04-04-1946    HISTORY OF PRESENT ILLNESS:  The patient is a 65 year old white male  patient of Dr. Kriste Basque, who has a known history of diabetes mellitus,  hyperlipidemia, chronic pain syndrome, degenerative joint disease, who  presents for an acute office visit.  The patient complains of a 1-week  history of right leg redness and cellulitis.  The patient complains that  one week ago he started noticing some right lower extremity redness  along the ankle area.  He was subsequently seen at [name]  and started on Keflex, of which he is on day #5 of 10.  He also  underwent venous Dopplers, which were negative for DVT.  The patient  complains that symptoms have improved, however he continues to have  redness at the site and is scheduled for neck surgery later this week.  The patient denies any fever, chest pain, shortness of breath, or calf  pain.  The patient has had some increased swelling over the weekend but  did note he was on his feet most of the weekend at a funeral.   PAST MEDICAL HISTORY:  Reviewed.   CURRENT MEDICATIONS:  Reviewed.   PHYSICAL EXAMINATION:  GENERAL:  The patient is a morbidly obese white  male in no acute distress.  VITAL SIGNS:  He is afebrile with stable vital signs.  O2 saturation is  94% on room air.  HEENT:  Unremarkable.  NECK:  Supple without adenopathy.  No JVD.  LUNGS:  Lung sounds are clear.  CARDIAC:  Regular rate.  ABDOMEN:  Morbidly obese and soft.  EXTREMITIES:  Warm with noted swelling along the right lower extremity  with some swelling bilaterally, right greater than left.  The patient  has venous insufficiency changes and stasis dermatitis.  The patient  does have redness along the right lateral ankle area up  to the mid-shin  along the anterior mid-shin.  No increased warmth is noted.  Pulses are  intact.  Negative Homans sign.   IMPRESSION AND PLAN:  Right lower extremity cellulitis.  Patient to  finish Keflex as scheduled.  Keep lower extremities elevated.  Warm  compresses to the area.  The patient may  use Lasix 20 mg over the next 3 days and is to follow back up with Dr.  Kriste Basque in 1-2 weeks or sooner if needed.      Rubye Oaks, NP  Electronically Signed      Lonzo Cloud. Kriste Basque, MD  Electronically Signed   TP/MedQ  DD: 05/22/2006  DT: 05/23/2006  Job #: [id number]

## 2010-11-11 ENCOUNTER — Other Ambulatory Visit: Payer: Self-pay | Admitting: Pulmonary Disease

## 2010-11-14 ENCOUNTER — Other Ambulatory Visit: Payer: Self-pay | Admitting: Pulmonary Disease

## 2010-12-21 ENCOUNTER — Other Ambulatory Visit: Payer: Self-pay | Admitting: Pulmonary Disease

## 2010-12-23 ENCOUNTER — Other Ambulatory Visit: Payer: Self-pay | Admitting: Pulmonary Disease

## 2011-02-02 ENCOUNTER — Other Ambulatory Visit: Payer: Self-pay | Admitting: Pulmonary Disease

## 2011-02-03 NOTE — Telephone Encounter (Signed)
Pt was last seen on 03/08/2010 by Dr.Nadel. There are no pending appointments for this patient. Medications last refilled on 12/29/2010 for #30 and no refills  by Randell Loop, CMA with a note that the patient would need an OV for further refills. Medications have been denied.

## 2011-02-20 ENCOUNTER — Other Ambulatory Visit: Payer: Self-pay | Admitting: Pulmonary Disease

## 2011-02-21 ENCOUNTER — Other Ambulatory Visit: Payer: Self-pay | Admitting: Pulmonary Disease

## 2011-02-28 ENCOUNTER — Ambulatory Visit (INDEPENDENT_AMBULATORY_CARE_PROVIDER_SITE_OTHER): Payer: Medicare Other | Admitting: General Surgery

## 2011-03-02 ENCOUNTER — Other Ambulatory Visit: Payer: Self-pay | Admitting: Pulmonary Disease

## 2011-03-09 ENCOUNTER — Encounter (INDEPENDENT_AMBULATORY_CARE_PROVIDER_SITE_OTHER): Payer: Self-pay | Admitting: General Surgery

## 2011-03-09 ENCOUNTER — Ambulatory Visit (INDEPENDENT_AMBULATORY_CARE_PROVIDER_SITE_OTHER): Payer: Medicare Other | Admitting: General Surgery

## 2011-03-09 VITALS — BP 136/84 | HR 64 | Temp 97.8°F | Resp 16 | Ht 69.0 in | Wt 262.0 lb

## 2011-03-09 DIAGNOSIS — D1779 Benign lipomatous neoplasm of other sites: Secondary | ICD-10-CM

## 2011-03-09 DIAGNOSIS — M47812 Spondylosis without myelopathy or radiculopathy, cervical region: Secondary | ICD-10-CM

## 2011-03-09 DIAGNOSIS — D1720 Benign lipomatous neoplasm of skin and subcutaneous tissue of unspecified limb: Secondary | ICD-10-CM

## 2011-03-11 ENCOUNTER — Encounter (INDEPENDENT_AMBULATORY_CARE_PROVIDER_SITE_OTHER): Payer: Self-pay | Admitting: General Surgery

## 2011-03-11 NOTE — Progress Notes (Signed)
Subjective:     Patient ID: James Parsons, male   DOB: 10-25-1945, 65 y.o.   MRN: [id number] Mr. Yuan is a 65 year old Caucasian male who I removed a lipoma and the right elbow area for local anesthesia a approximately 10 years ago this was done in very close to the gallbladder not truly the ulnar nerve exposed and the patient did well was seen on a couple occasions afterwards and then several years ago fell from his poor and his head extensive problems with his cervical and lumbar back and has had several neurological neurosurgical procedure and has a metal both in the neck and in the lumbar area he is having some pain in the right arm but it's not in the ulnar nerve distribution but in the medial nerve distributions or at the base of the and has also noticed that there may be a little reoccurrence of a lipoma and not in the immediate area but in the medial aspect between the attachment of the biceps and he'll aspect of the arm he had called on Toradol been about for this we will subcutaneous mass could be given and the pain and Robin since I operated on her before said it could be seen and is here today HPI   Review of Systems  Past Surgical History  Procedure Date  . Back surgery 4132,4401  . Morphine pump 2009    due to reaction to morphine   Allergies  Allergen Reactions  . Codeine Phosphate     REACTION: itching  . Morphine And Related    Current Outpatient Prescriptions  Medication Sig Dispense Refill  . aspirin 81 MG tablet Take 81 mg by mouth daily.        . celecoxib (CELEBREX) 200 MG capsule Take 200 mg by mouth 2 (two) times daily.        . cloNIDine (CATAPRES) 0.1 MG tablet       . cyclobenzaprine (FLEXERIL) 10 MG tablet       . CYMBALTA 60 MG capsule       . ENDOCET 10-325 MG per tablet       . glimepiride (AMARYL) 2 MG tablet TAKE ONE TABLET BY MOUTH EVERY MORNING  30 tablet  0  . lidocaine (LIDODERM) 5 % Place 1 patch onto the skin daily. Remove & Discard patch within  12 hours or as directed by MD       . LYRICA 150 MG capsule       . metFORMIN (GLUCOPHAGE) 500 MG tablet TAKE 2 TABLET BY MOUTH TWO TIMES A DAY  120 tablet  0  . simvastatin (ZOCOR) 40 MG tablet TAKE ONE TABLET BY MOUTH AT BEDTIME  30 tablet  0  . traMADol (ULTRAM) 50 MG tablet             Objective:   Physical Exam BP 136/84  Pulse 64  Temp(Src) 97.8 F (36.6 C) (Temporal)  Resp 16  Ht 5\' 9"  (1.753 m)  Wt 262 lb (118.842 kg)  BMI 38.69 kg/m2 On physical exam there is a small amount of subcutaneous excess fatty tissue in the distal humeral area does not really a definite lipoma but its cannot immediately adjacent more proximal to where the incision was removed this lipoma around his elbow the pain for which she is describing is at the base of the thumb and medial nerve distribution which is well away from where the nerve distributions if there was any pressure coming from this little proximal  fatty tissue and on reviewing his chart he's been operated in the lumbar area and also the cervical area this was all results of this accident and most likely the pain that he is describing is coming from his chronic cervical arthritis and other neck problem. I did a ultrasound and gluteal area that you're see any as fatty tissue and this may actually be a lipoma and not just an abundance of his normal adipose of the distal humeral area. If this area was doubly operated on I think it unlikely that it would benefit him relief of pain and I would think that if it would need surgery that one of the extremity surgery i.e. orthopedic surgeons would be the best one to do this but I would not advise if not think it unlikely that its would give him any improvement in the nerve distribution [place] describing his pain and the pain distribution underwear he is describing is the upper cervical and not the lower areas of the brachial plexus and that's where he's got the hardware and his neck. The neurosurgeons advise him  there is no additional neurosurgery that can be done and he is presently being managed in the pain clinic     Assessment:    Excess fatty tissue possibly a true lipoma in the distal humeral area just proximal to her previous lipoma was excised 10 years earlier patient's pain is related to his cervical arthritis since it's a much higher dermatitis and then the ulnar distribution. He is comfortable not pursuing any type of or removal of this fatty tissue since I think it's very unlikely that it would give him any benefit in his pain management. If he will desired that this be excised he needs to see one of the orthopedic operative room in the surgeon's and I'm sure they would to nerves mapping etc. prior to any surgical excision     Plan:     See note above

## 2011-04-01 ENCOUNTER — Other Ambulatory Visit: Payer: Self-pay | Admitting: Pulmonary Disease

## 2011-04-22 ENCOUNTER — Encounter: Payer: Self-pay | Admitting: Pulmonary Disease

## 2011-04-22 ENCOUNTER — Ambulatory Visit (INDEPENDENT_AMBULATORY_CARE_PROVIDER_SITE_OTHER): Payer: BC Managed Care – PPO | Admitting: Pulmonary Disease

## 2011-04-22 ENCOUNTER — Ambulatory Visit (INDEPENDENT_AMBULATORY_CARE_PROVIDER_SITE_OTHER)
Admission: RE | Admit: 2011-04-22 | Discharge: 2011-04-22 | Disposition: A | Discharge status: Home / Self Care | Payer: BC Managed Care – PPO | Source: Ambulatory Visit | Attending: Pulmonary Disease | Admitting: Pulmonary Disease

## 2011-04-22 ENCOUNTER — Other Ambulatory Visit (INDEPENDENT_AMBULATORY_CARE_PROVIDER_SITE_OTHER): Payer: BC Managed Care – PPO

## 2011-04-22 DIAGNOSIS — G894 Chronic pain syndrome: Secondary | ICD-10-CM

## 2011-04-22 DIAGNOSIS — Z23 Encounter for immunization: Secondary | ICD-10-CM

## 2011-04-22 DIAGNOSIS — E785 Hyperlipidemia, unspecified: Secondary | ICD-10-CM

## 2011-04-22 DIAGNOSIS — I10 Essential (primary) hypertension: Secondary | ICD-10-CM

## 2011-04-22 DIAGNOSIS — E119 Type 2 diabetes mellitus without complications: Secondary | ICD-10-CM

## 2011-04-22 DIAGNOSIS — F419 Anxiety disorder, unspecified: Secondary | ICD-10-CM

## 2011-04-22 DIAGNOSIS — K5730 Diverticulosis of large intestine without perforation or abscess without bleeding: Secondary | ICD-10-CM

## 2011-04-22 DIAGNOSIS — D126 Benign neoplasm of colon, unspecified: Secondary | ICD-10-CM

## 2011-04-22 DIAGNOSIS — E669 Obesity, unspecified: Secondary | ICD-10-CM

## 2011-04-22 DIAGNOSIS — M545 Low back pain, unspecified: Secondary | ICD-10-CM

## 2011-04-22 DIAGNOSIS — J449 Chronic obstructive pulmonary disease, unspecified: Secondary | ICD-10-CM

## 2011-04-22 DIAGNOSIS — N139 Obstructive and reflux uropathy, unspecified: Secondary | ICD-10-CM

## 2011-04-22 DIAGNOSIS — F411 Generalized anxiety disorder: Secondary | ICD-10-CM

## 2011-04-22 DIAGNOSIS — M25561 Pain in right knee: Secondary | ICD-10-CM

## 2011-04-22 DIAGNOSIS — I872 Venous insufficiency (chronic) (peripheral): Secondary | ICD-10-CM

## 2011-04-22 LAB — CBC WITH DIFFERENTIAL/PLATELET
Basophils Absolute: 0.1 10*3/uL (ref 0.0–0.1)
Eosinophils Absolute: 0.3 10*3/uL (ref 0.0–0.7)
HCT: 42.2 % (ref 39.0–52.0)
Hemoglobin: 14 g/dL (ref 13.0–17.0)
Lymphs Abs: 3.7 10*3/uL (ref 0.7–4.0)
MCHC: 33.1 g/dL (ref 30.0–36.0)
MCV: 91.5 fl (ref 78.0–100.0)
Monocytes Relative: 4.5 % (ref 3.0–12.0)
Neutro Abs: 9.3 10*3/uL — ABNORMAL HIGH (ref 1.4–7.7)
RDW: 14.2 % (ref 11.5–14.6)

## 2011-04-22 LAB — PSA: PSA: 0.29 ng/mL (ref 0.10–4.00)

## 2011-04-22 LAB — TSH: TSH: 2.72 u[IU]/mL (ref 0.35–5.50)

## 2011-04-22 LAB — BASIC METABOLIC PANEL
Calcium: 9.7 mg/dL (ref 8.4–10.5)
GFR: 100.17 mL/min (ref >60.00)
Sodium: 140 mEq/L (ref 135–145)

## 2011-04-22 LAB — HEMOGLOBIN A1C: Hgb A1c MFr Bld: 7.7 % — ABNORMAL HIGH (ref 4.6–6.5)

## 2011-04-22 LAB — LIPID PANEL
HDL: 47.7 mg/dL (ref >39.00)
Total CHOL/HDL Ratio: 4
VLDL: 19.8 mg/dL (ref 0.0–40.0)

## 2011-04-22 LAB — HEPATIC FUNCTION PANEL
Alkaline Phosphatase: 83 U/L (ref 39–117)
Bilirubin, Direct: 0.1 mg/dL (ref 0.0–0.3)
Total Bilirubin: 0.5 mg/dL (ref 0.3–1.2)

## 2011-04-22 MED ORDER — SIMVASTATIN 40 MG PO TABS: 40.0000 mg | ORAL_TABLET | Freq: Every day | ORAL | Status: DC

## 2011-04-22 MED ORDER — CLONIDINE HCL 0.1 MG PO TABS: 0.1000 mg | ORAL_TABLET | Freq: Two times a day (BID) | ORAL | Status: DC

## 2011-04-22 MED ORDER — METFORMIN HCL 500 MG PO TABS: ORAL_TABLET | ORAL | Status: DC

## 2011-04-22 MED ORDER — GLIMEPIRIDE 2 MG PO TABS: 2.0000 mg | ORAL_TABLET | Freq: Every day | ORAL | Status: DC

## 2011-04-22 NOTE — Patient Instructions (Signed)
Today we updated your med list in our EPIC system...    Continue your current medications the same...    We refilled your prescriptions as requested...  Today we did your follow up CXR & fasting blood work...    Please call the PHONE [facility]REE in a few days for your results...    Dial [id number] & when prompted enter your patient number followed by the # symbol...    Your patient number is:  [id number]#  We gave you the 2012 FLU vaccine today & the PNEUMOVAX (due at age 45)...  Call for any questions...  We should plan a recheck on your medical issues in 6 months.Marland KitchenMarland Kitchen

## 2011-04-22 NOTE — Progress Notes (Signed)
Subjective:     Patient ID: James Parsons, male   DOB: 02-03-1946, 66 y.o.   MRN: [id number]  HPI 66 y/o WM here for a follow up visit... he has multiple medical problems including COPD (former heavy smoker);  HBP;  Hyperlipidemia & DM;  Obesity;  Divertics & Polyps;  LBP, RSD, & chr pain syndrome...  ~  Sep 08, 2009:  last OV his FBS=145 & A1c=7.4 so Glimepiride 1mg  was added to his Metformin Bid... inexplicably he never refilled the Glimep & he never refilled the Simvastatin 40mg  either (he thinks it was the pharm fault, but I called them & there are refills avail- he just never called for the refills)...  still not really on diet & not able to exercise, weight 270# w/o change...  we decided to recheck fasting blood work & start back on meds as indicated...   **NOTE: we received letter from [facility] program- we will ask them to aide in medication compliance, diet & exercise program...  ~  March 08, 2010: his CC remains his chr pain for which he sees DrRauch pain clinic every month w/ 8 meds and pain pump in spine & TENS implanted as well... he states that his breathing is OK;  notes his BP is only elev when he is in pain & denies CP/ angina etc;  BS & A1c are way out of control & we are adding Glimep + max Metformin;  we reviewed diet + exercise profram & he must lose weight to keep himself from having to start on insulin... OK Flu vaccine today.  ~  April 22, 2011:  19mo ROV & his complaints revolve around his chronic pain & his pain meds which are managed by his Pain Clinic- DrRauch/ [place] who "burned the nerves in my back" & continue to treat him w/ SQ pain pump w/ Fentenyl, spinal cord stimulator, Oxycodone, Cymbalta, Lyrica, Celebrex, Flexeril, Ultram, Lidoderm; we do not have notes from the pain clinic ...  We reserved our follow up eval & discussion to his medical problems which he continues to ignore since his chronic pain issues are all that he cares about... MrSmith did not bring  med bottles or list today> identifies meds as "red pill" "green pill" etc (but we did call his Pharm- [facility] on [address] for info on med compliance)...    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    >BP controlled on low sodium diet + Clonidine 0.1mg Bid started by [facility] for his pain syndrome; he notes BP elev when his pain is worse; BP today= 132/88 & he notes similar at home...    >Chol is controlled by Simva40 (plus diet) and f/u FLP showed LDL=119 likely related to medication compliance issues...    >When last seen 11/11 his BS=385 & A1c=10.6> Metformin was incr to 1000Bid & Glimep 2mg Qam added; pt never returned as requested & Pharm confirms that he hasn't been taking either med regularly; weight is unchanged at 264# & he has not been on diet & can't exercise due to his back; labs today showed BS=137, A1c=7.7 and he is asked to take the current meds regularly & return so  we can assess their true efficacy before recommending any adjustments; we reviewed diet as well...    He saw DrWeatherly [facility] for recurrent lipoma right upper arm but he didn't think it was contributing to his pain therefore didn't rec surg...    He requests Ortho referral for someone to check his knees...    He requests Flu shot today & in need of Pneumovax as well...          Problem List:   COPD (ICD-496) - former 3ppd smoker, quit 2003... He states "my breathing is ok" & denies cough, phlegm, change in SOB, etc; he is sedentary due to chronic back pain... 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 ~  CXR 1/13 showed low lung volumes, mild pleural thickening  (extrapleural fat), lungs clear, NAD... ~  PFT 1/13 showed FVC=2.65 (59%), FEV1=1.89 (54%), %1sec=71, mid-flows=35% pred; c/w mild obstructive & superimposed restrictive defect...  HYPERTENSION, BORDERLINE (ICD-401.9) - his BP has been elev intermittently in the past, but he states just when he is in pain... DrRauck has him on CLONIDINE 0.1mg  Bid which obviously also helps his pressure... ~  5/11:  BP= 116/70 & he denies HA, visual changes, CP, palipit, syncope, ch in dyspnea, edema, etc... ~  11/11:  BP= 160/90 but he is having considerable pain recently he says & he doesn't want additional meds. ~  1/13:  BP= 132/88 & he denies CP, palpit, ch in SOB, syncope, edema, etc...  VENOUS INSUFFICIENCY (ICD-459.81) - hx chr venous insuffic and cellulitis in the past; no recent soft tissue infections; he follows a low sodium diet, elevates legs, wears support hose when necessary...  HYPERLIPIDEMIA (ICD-272.4) - supposed to be on SIMVASTATIN 40mg /d, but he has been filling this irregularly (prev on Vytorin 10-40 but he is very erratic at filling that); we discussed low chol/ low fat diet, incr exercise, get weight down! ~  FLP 5/08 showed TChol 190, TG 164, HDL 36, LDL 121... ?taking Vytor10-40? ~  FLP 6/10 showed TChol 224, TG 183, HDL 31, LDL 176... rec> Rx Simva40. ~  FLP 11/10 on Simva40 showed TChol 159, TG 94, HDL 43, LDL 98... continue same. ~  pt stopped filling his perscription for Simva40 ~1/11... ~  FLP 5/11 showed TChol 240, TG 205, HDL 35, LDL 178... needs med> restart Simva40 & stay on it! ~  FLP 11/11 showed TChol 158, TG 181, HDL 32, LDL 90 ~  FLP 1/13 ?on Simva40 showed TChol 186, TG 99, HDL 48, LDL 119... rec to take the Simva40 every night.  DIABETES MELLITUS (ICD-250.00) - supposed to be on METFORMIN 500mg - 2Bid & GLIMEPIRIDE 2mg  Qam but Pharm confirms irregular filling of prescriptions; we discussed low carb diet, exercise & wt reduction! ~  labs 2/08 showed BS= 107, A1c= 6.4... On  Metformin500Bid. ~  labs 5/08 showed BS= 99, A1c= 6.2.Marland KitchenMarland Kitchen Continue same. ~  labs 6/10 showed BS= 127, A1c= 6.6.[name]. rec> take med regularly! ~  labs 11/10 showed BS= 145, A1c= 7.4.[name]. rec> add Glimep 1mg Qam (only took it for 2-4mo).  ~  labs 5/11 showed BS= 118, A1c= 6.8.[name]. if he can lose weight he can avoid more meds... ~  labs 11/11 showed BS= 385, A1c= 10.6.Marland KitchenMarland Kitchen ?what happened? rec> incr Metform500-2Bid, Add Glimep2mg /d. ~  Labs 1/13 showed BS= 137, A1c= 7.7.Marland KitchenMarland Kitchen Pharm confirms poor med compliance; aske to take meds regularly so we can assess efficacy.  OBESITY (ICD-278.00) - he is not dieting/ exercising/ or trying  to lose weight in any fashion!!!  We discussed weight reducing diet strategies and exercise program that he should be able to perform. ~  last weight <230# was 1995 @ 226#... range over 15 yrs= 238-278# ~  weight 5/08 = 239# ~  weight 10/08 = 253# ~  weight 6/10 = 278# ~  weight 11/10 = 273# ~  weight 5/11 = 270# ~  weight 11/11 = 262# ~  Weight 1/13 = 264#  DIVERTICULOSIS OF COLON (ICD-562.10),  COLONIC POLYPS (ICD-211.3),  CONSTIPATION (ICD-564.00) - he is instructed to take MIRALAX Bid & SENAKOT-S 2Qhs for his narcotic induced constipation problem... ~  last colonoscopy 1/07 by DrPerry showed divertics, 1mm polyp (not retrieved)... f/u planned 46yrs. ~  1/13:  He is overdue for GI follow up & we will send referral/ reminder to DrPerry's office...  Hx of PROSTATITIS (ICD-601.0) - hx acute prostatitis treated in the 1990's by DrPeterson... ~  labs 6/10 showed PSA= 0.48 ~  labs 5/11 showed PSA= 0.28 ~  11/11: notes some irritation on penis- Rx Lotrisone cream trial. 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 followed by Vito Berger al at the [facility]: he has a spinal cord stimulator, and subcut pain pump w/ Fentanyl (reaction to  MS)...  ~  2/04:  eval by Tora Perches w/ HNP L3-4 right w/ spinal stenosis, lateral recessed stenosis- had microdiscectomy & decompression. ~  2/08:  Myelogram by DrJJenkins w/ multilevel cervical DDD & stenosis- s/p ant cerv discectomy & fusion w/ plating at C3-4. ~  6/10: current meds from DrRauch includes--- Percocet 10mg - Tid; Tramadol 50mg - 1 to 2 Qid; Celebrex 200mg Bid; Lidoderm patches; Lyrica 150mg Bid; Cymbalta 60mg /d; Flexeril 10mg Tid; Clonidine0.1mg - 1.2 Bid for RSD... ~  11/10: pt reports Myelogram & CT scan showed 5 discs & spinal stenosis... he is to see Neurosurg soon. ~  5/11:  he reports monthly OV's w/ Pain Management & continues on 8 meds listed... ~  Imaging data in Epic showed CT Myelogram CSpine 3/12 by DrJJenkins> prev fusion C3-4 is satis, progressive spondylosis & facet degen at C2-3, unchanged spondy & DDD at C4-5, mild sp stenosis at several levels. ~  1/13:  He continues on same pain med regimen from DrRauch etal> we do not have notes from Pain management or Neurosurg...  VITAMIN D DEFICIENCT >> rec to take OTC Vit D supplement ~2000u daily... ~  Labs 1/13 showed Vit D level = 21... rec to start OTC Vit D supplement ~2000u daily...  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   Past Surgical History  Procedure Date  . Back surgery [id number]  . Morphine pump 2009    due to reaction to morphine  . Excision of lipoma from right olecranon area 11/2000    Dr. Zachery Dakins  . Microdiscectomy and decompression 05/2002    Dr. Shelle Iron  . C3-4 anterior cervical discectomy and fusion with plating at c3-4 05/2006    Dr. Lovell Sheehan  . Spinal cord stimulator implanted   . Subcut pain pump implanted     Outpatient Encounter Prescriptions as of 04/22/2011  Medication Sig Dispense Refill  . aspirin 81 MG tablet Take 81 mg by mouth daily.        [name]  celecoxib  (CELEBREX) 200 MG capsule Take 200 mg by mouth 2 (two) times daily.        . cloNIDine (CATAPRES) 0.1 MG tablet Take 0.1 mg by mouth 2 (two) times daily.       . cyclobenzaprine (FLEXERIL) 10 MG tablet Take by mouth 2 (two) times daily as needed.       . CYMBALTA 60 MG capsule Take 60 mg by mouth daily.       . ENDOCET 10-325 MG per tablet Take 1 tablet by mouth 3 (three) times daily as needed.       [name] glimepiride (AMARYL) 2 MG tablet TAKE ONE TABLET BY MOUTH EVERY MORNING  30 tablet  0  . lidocaine (LIDODERM) 5 % Place 1 patch onto the skin daily. Remove & Discard patch within 12 hours or as directed by MD       . LYRICA 150 MG capsule Take 150 mg by mouth 2 (two) times daily.       . metFORMIN (GLUCOPHAGE) 500 MG tablet TAKE 2 TABLET BY MOUTH TWO TIMES A DAY  120 tablet  0  . simvastatin (ZOCOR) 40 MG tablet TAKE ONE TABLET BY MOUTH AT BEDTIME  30 tablet  0  . traMADol (ULTRAM) 50 MG tablet Take 50 mg by mouth 3 (three) times daily as needed.         Allergies  Allergen Reactions  . Codeine Phosphate     REACTION: itching  . Morphine And Related     Current Medications, Allergies, Past Medical History, Past Surgical History, Family History, and Social History were reviewed in [name] record.    Review of Systems         See HPI - .all other systems neg except as noted... The patient complains of dyspnea on exertion, peripheral edema, muscle weakness, and difficulty walking due to chr LBP (has cane).  The patient denies anorexia, fever, weight loss, weight gain, vision loss, decreased hearing, hoarseness, chest pain, syncope, prolonged cough, headaches, hemoptysis, abdominal pain, melena, hematochezia, severe indigestion/heartburn, hematuria, incontinence, suspicious skin lesions, transient blindness, depression, unusual weight change, abnormal bleeding, enlarged lymph nodes, and angioedema.     Objective:   Physical Exam     WD, Obese, 66 y/o WM in  NAD... GENERAL:  Alert & oriented; pleasant & cooperative. HEENT:  [place]/AT, EOM-wnl, PERRLA, EACs-clear, TMs-wnl, NOSE-clear, THROAT-clear & wnl. NECK:  Supple w/ decrROM & ant cerv scar; no JVD; normal carotid impulses w/o bruits; no thyromegaly or nodules palpated; no lymphadenopathy. CHEST:  Clear to P & A; without wheezes/ rales/ or rhonchi heard... HEART:  Regular Rhythm; without murmurs/ rubs/ or gallops detected... ABDOMEN:  Obese, soft & nontender; decr bowel sounds; no organomegaly or masses palpated... EXT: without deformities, mild arthritic changes; no varicose veins/ +venous insuffic/ tr edema. NEURO:  CN's intact; motor testing normal (lim by pain); sensory testing normal; abn gait & balance fair... Ambulates w/ cane, scar of prev Lumbar surg, & has implanted sp cord stimulator. DERM:  Mod lipoma in right antecubital area...  RADIOLOGY DATA:  Reviewed in the EPIC EMR & discussed w/ the patient...    >>CXR today (see above) NAD...  LABORATORY DATA:  Reviewed in the EPIC EMR & discussed w/ the patient...    >>Fasting labs today reviewed & pt notified...   Assessment:     COPD>  Former 3ppd smoker & quit 2003;  CXR w/ COPD/emphysema, NAD;  PFT w/ combined mild obstructive & restrictive  dis;  He denies brewathing problems & doesn't want inhalers etc...  HBP>  Controlled on diet & Clonidine (per DrRauch);  States BP is only elev when he is in pain...  Ven Insuffic>  He knows to avoid sodium, elev legs, wear support hose etc...  Hyperlipid>  On Simva40 but w/ suboptimal medication & diet compliance;  Asked to take med daily...  DM>  On Metformin + Glimep as above;  Compliance has been poor & asked to take regularly so we can assess it's efficacy.  Obesity>  Unable yo exercise he says due to chronic back pain;  We reviewed wt reducing diet strategies...  GI> Divertics, Polyps, Constip>  He is overdue for f/u colonoscopy & we will refer chart to GI...  LBP/ RSD/ Chronic Pain  Syndrome>  This is his main problem that consumes his life 24/7; Managed & followed by Pain Management DrRauch etal...     Plan:     Patient's Medications  New Prescriptions   No medications on file  Previous Medications   ASPIRIN 81 MG TABLET    Take 81 mg by mouth daily.     CELECOXIB (CELEBREX) 200 MG CAPSULE    Take 200 mg by mouth 2 (two) times daily.     CYCLOBENZAPRINE (FLEXERIL) 10 MG TABLET    Take by mouth 2 (two) times daily as needed.    CYMBALTA 60 MG CAPSULE    Take 60 mg by mouth daily.    ENDOCET 10-325 MG PER TABLET    Take 1 tablet by mouth 3 (three) times daily as needed.    LIDOCAINE (LIDODERM) 5 %    Place 1 patch onto the skin daily. Remove & Discard patch within 12 hours or as directed by MD    LYRICA 150 MG CAPSULE    Take 150 mg by mouth 2 (two) times daily.    TRAMADOL (ULTRAM) 50 MG TABLET    Take 50 mg by mouth 3 (three) times daily as needed.   Modified Medications   Modified Medication Previous Medication   CLONIDINE (CATAPRES) 0.1 MG TABLET cloNIDine (CATAPRES) 0.1 MG tablet      Take 1 tablet (0.1 mg total) by mouth 2 (two) times daily.    Take 0.1 mg by mouth 2 (two) times daily.    GLIMEPIRIDE (AMARYL) 2 MG TABLET glimepiride (AMARYL) 2 MG tablet      Take 1 tablet (2 mg total) by mouth daily before breakfast.    TAKE ONE TABLET BY MOUTH EVERY MORNING   METFORMIN (GLUCOPHAGE) 500 MG TABLET metFORMIN (GLUCOPHAGE) 500 MG tablet      Take 2 tablets by mouth two times daily    TAKE 2 TABLET BY MOUTH TWO TIMES A DAY   SIMVASTATIN (ZOCOR) 40 MG TABLET simvastatin (ZOCOR) 40 MG tablet      Take 1 tablet (40 mg total) by mouth at bedtime.    TAKE ONE TABLET BY MOUTH AT BEDTIME  Discontinued Medications   No medications on file  \

## 2011-04-23 LAB — VITAMIN D 25 HYDROXY (VIT D DEFICIENCY, FRACTURES): Vit D, 25-Hydroxy: 21 ng/mL — ABNORMAL LOW (ref 30–89)

## 2011-04-27 ENCOUNTER — Encounter: Payer: Self-pay | Admitting: Pulmonary Disease

## 2011-10-18 ENCOUNTER — Other Ambulatory Visit (INDEPENDENT_AMBULATORY_CARE_PROVIDER_SITE_OTHER): Payer: Medicare Other

## 2011-10-18 ENCOUNTER — Encounter: Payer: Self-pay | Admitting: Pulmonary Disease

## 2011-10-18 ENCOUNTER — Ambulatory Visit (INDEPENDENT_AMBULATORY_CARE_PROVIDER_SITE_OTHER): Payer: Medicare Other | Admitting: Pulmonary Disease

## 2011-10-18 VITALS — BP 142/86 | HR 108 | Temp 98.6°F | Ht 67.0 in | Wt 242.0 lb

## 2011-10-18 DIAGNOSIS — I10 Essential (primary) hypertension: Secondary | ICD-10-CM

## 2011-10-18 DIAGNOSIS — M545 Low back pain, unspecified: Secondary | ICD-10-CM

## 2011-10-18 DIAGNOSIS — J449 Chronic obstructive pulmonary disease, unspecified: Secondary | ICD-10-CM

## 2011-10-18 DIAGNOSIS — K5900 Constipation, unspecified: Secondary | ICD-10-CM

## 2011-10-18 DIAGNOSIS — E119 Type 2 diabetes mellitus without complications: Secondary | ICD-10-CM

## 2011-10-18 DIAGNOSIS — E785 Hyperlipidemia, unspecified: Secondary | ICD-10-CM

## 2011-10-18 DIAGNOSIS — E669 Obesity, unspecified: Secondary | ICD-10-CM

## 2011-10-18 DIAGNOSIS — K5730 Diverticulosis of large intestine without perforation or abscess without bleeding: Secondary | ICD-10-CM

## 2011-10-18 DIAGNOSIS — G894 Chronic pain syndrome: Secondary | ICD-10-CM

## 2011-10-18 DIAGNOSIS — I872 Venous insufficiency (chronic) (peripheral): Secondary | ICD-10-CM

## 2011-10-18 LAB — BASIC METABOLIC PANEL
CO2: 29 mEq/L (ref 19–32)
Chloride: 97 mEq/L (ref 96–112)
Potassium: 4.5 mEq/L (ref 3.5–5.1)
Sodium: 136 mEq/L (ref 135–145)

## 2011-10-18 LAB — LIPID PANEL
Cholesterol: 154 mg/dL (ref 0–200)
HDL: 36 mg/dL — ABNORMAL LOW (ref >39.00)
LDL Cholesterol: 82 mg/dL (ref 0–99)
Triglycerides: 179 mg/dL — ABNORMAL HIGH (ref 0.0–149.0)
VLDL: 35.8 mg/dL (ref 0.0–40.0)

## 2011-10-18 LAB — HEMOGLOBIN A1C: Hgb A1c MFr Bld: 10 % — ABNORMAL HIGH (ref 4.6–6.5)

## 2011-10-18 NOTE — Progress Notes (Signed)
Subjective:     Patient ID: James Parsons, male   DOB: 02-03-1946, 66 y.o.   MRN: [id number]  HPI 66 y/o WM here for a follow up visit... he has multiple medical problems including COPD (former heavy smoker);  HBP;  Hyperlipidemia & DM;  Obesity;  Divertics & Polyps;  LBP, RSD, & chr pain syndrome...  ~  Sep 08, 2009:  last OV his FBS=145 & A1c=7.4 so Glimepiride 1mg  was added to his Metformin Bid... inexplicably he never refilled the Glimep & he never refilled the Simvastatin 40mg  either (he thinks it was the pharm fault, but I called them & there are refills avail- he just never called for the refills)...  still not really on diet & not able to exercise, weight 270# w/o change...  we decided to recheck fasting blood work & start back on meds as indicated...   **NOTE: we received letter from [facility] program- we will ask them to aide in medication compliance, diet & exercise program...  ~  March 08, 2010: his CC remains his chr pain for which he sees DrRauch pain clinic every month w/ 8 meds and pain pump in spine & TENS implanted as well... he states that his breathing is OK;  notes his BP is only elev when he is in pain & denies CP/ angina etc;  BS & A1c are way out of control & we are adding Glimep + max Metformin;  we reviewed diet + exercise profram & he must lose weight to keep himself from having to start on insulin... OK Flu vaccine today.  ~  April 22, 2011:  19mo ROV & his complaints revolve around his chronic pain & his pain meds which are managed by his Pain Clinic- DrRauch/ [place] who "burned the nerves in my back" & continue to treat him w/ SQ pain pump w/ Fentenyl, spinal cord stimulator, Oxycodone, Cymbalta, Lyrica, Celebrex, Flexeril, Ultram, Lidoderm; we do not have notes from the pain clinic ...  We reserved our follow up eval & discussion to his medical problems which he continues to ignore since his chronic pain issues are all that he cares about... James Parsons did not bring  med bottles or list today> identifies meds as "red pill" "green pill" etc (but we did call his Pharm- [facility] on [address] for info on med compliance)...    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    >BP controlled on low sodium diet + Clonidine 0.1mg Bid started by [facility] for his pain syndrome; he notes BP elev when his pain is worse; BP today= 132/88 & he notes similar at home...    >Chol is controlled by Simva40 (plus diet) and f/u FLP showed LDL=119 likely related to medication compliance issues...    >When last seen 11/11 his BS=385 & A1c=10.6> Metformin was incr to 1000Bid & Glimep 2mg Qam added; pt never returned as requested & Pharm confirms that he hasn't been taking either med regularly; weight is unchanged at 264# & he has not been on diet & can't exercise due to his back; labs today showed BS=137, A1c=7.7 and he is asked to take the current meds regularly & return so  we can assess their true efficacy before recommending any adjustments; we reviewed diet as well...    He saw DrWeatherly [facility] for recurrent lipoma right upper arm but he didn't think it was contributing to his pain therefore didn't rec surg...    He requests Ortho referral for someone to check his knees...    He requests Flu shot today & in need of Pneumovax as well...  ~  October 18, 2011:  49mo ROV & James Parsons remains in pain all the time every day; still sees DrRauch every month w/ spinal cord stimulator & pain pump under the skin, plus Oxycodone, Flexeril, Celebrex, Tramadol, Lyrica, Cymbalta...  Somehow he has lost 22# down to 242# today "on diet" he says; unfortunately his DM control is no better w/  A1c=10.0 and we will increase his meds further (he doesn't want insulin if it can be avoided...    We reviewed prob list, meds, xrays and labs> see below for updates>> LABS 7/13:  FLP- at goals on Simva40 x TG=179;  Chems- ok x BS=207 & A1c=10.0.Marland KitchenMarland Kitchen          Problem List:   COPD (ICD-496) - former 3ppd smoker, quit 2003... He states "my breathing is ok" & denies cough, phlegm, change in SOB, etc; he is sedentary due to chronic back pain... 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 ~  CXR 1/13 showed low lung volumes, mild pleural thickening (extrapleural fat), lungs clear, NAD... ~  PFT 1/13 showed FVC=2.65 (59%), FEV1=1.89 (54%), %1sec=71, mid-flows=35% pred; c/w mild obstructive & superimposed restrictive defect...  HYPERTENSION, BORDERLINE (ICD-401.9) - his BP has been elev intermittently in the past, but he states just when he is in pain... DrRauck has him on CLONIDINE 0.1mg  Bid which obviously also helps his pressure... ~  5/11:  BP= 116/70 & he denies HA, visual changes, CP, palipit, syncope, ch in dyspnea, edema, etc... ~  11/11:  BP= 160/90 but he is having considerable pain recently he says & he doesn't want additional meds. ~  1/13:  BP= 132/88 & he denies CP, palpit, ch in SOB, syncope, edema, etc.. ~  7/13:  BP= 142/86 & he remains stable from the CV standpoint, still too sedentary due to his back problems...  VENOUS INSUFFICIENCY (ICD-459.81) - hx chr venous insuffic and cellulitis in the past; no recent soft tissue infections; he follows a low sodium diet, elevates legs, wears support hose when necessary...  HYPERLIPIDEMIA (ICD-272.4) - supposed to be on SIMVASTATIN 40mg /d, but he has been filling this irregularly (prev on Vytorin 10-40 but he is very erratic at filling that); we discussed low chol/ low fat diet, incr exercise, get weight down! ~  FLP 5/08 showed TChol 190, TG 164, HDL 36, LDL 121... ?taking Vytor10-40? ~  FLP 6/10 showed TChol 224, TG 183,  HDL 31, LDL 176... rec> Rx Simva40. ~  FLP 11/10 on Simva40 showed TChol 159, TG 94, HDL 43, LDL 98... continue same. ~  pt stopped filling his perscription for Simva40 ~1/11... ~  FLP 5/11 showed TChol 240, TG 205, HDL 35, LDL 178... needs med> restart Simva40 & stay on it! ~  FLP 11/11 showed TChol 158, TG 181, HDL 32, LDL 90 ~  FLP 1/13 ?on Simva40 showed TChol 186, TG 99, HDL 48, LDL 119... rec to take the Simva40 every night. ~  FLP 7/13 on Simva40 showed TChol 154, TG 179, HDL 36, LDL 82... Needs better low fat diet & wt  reduction.  DIABETES MELLITUS (ICD-250.00) - supposed to be on METFORMIN 500mg - 2Bid & GLIMEPIRIDE 2mg  Qam but Pharm confirms irregular filling of prescriptions; we discussed low carb diet, exercise & wt reduction! ~  labs 2/08 showed BS= 107, A1c= 6.4... On Metformin500Bid. ~  labs 5/08 showed BS= 99, A1c= 6.2.Marland KitchenMarland Kitchen Continue same. ~  labs 6/10 showed BS= 127, A1c= 6.6.[name]. rec> take med regularly! ~  labs 11/10 showed BS= 145, A1c= 7.4.[name]. rec> add Glimep 1mg Qam (only took it for 2-39mo).  ~  labs 5/11 showed BS= 118, A1c= 6.8.[name]. if he can lose weight he can avoid more meds... ~  labs 11/11 showed BS= 385, A1c= 10.6.Marland KitchenMarland Kitchen ?what happened? rec> incr Metform500-2Bid, Add Glimep2mg /d. ~  Labs 1/13 showed BS= 137, A1c= 7.7.Marland KitchenMarland Kitchen Pharm confirms poor med compliance; asked to take meds regularly so we can assess efficacy. ~  Labs 7/13 on Metform500-2Bid+Glim2 showed BS= 207, A1c= 10.0.Marland KitchenMarland Kitchen Rec- incr Glim4mg Qam & add JANUVIA 100mg Qpm...  OBESITY (ICD-278.00) - he is not dieting/ exercising/ or trying to lose weight in any fashion!!!  We discussed weight reducing diet strategies and exercise program that he should be able to perform. ~  last weight <230# was 1995 @ 226#... range over 15 yrs= 238-278# ~  weight 5/08 = 239# ~  weight 10/08 = 253# ~  weight 6/10 = 278# ~  weight 11/10 = 273# ~  weight 5/11 = 270# ~  weight 11/11 = 262# ~  Weight 1/13 = 264# ~  Weight 7/13 = 242#... Great job  w/ wt loss!  DIVERTICULOSIS OF COLON (ICD-562.10),  COLONIC POLYPS (ICD-211.3),  CONSTIPATION (ICD-564.00) - he is instructed to take MIRALAX Bid & SENAKOT-S 2Qhs for his narcotic induced constipation problem... ~  last colonoscopy 1/07 by DrPerry showed divertics, 1mm polyp (not retrieved)... f/u planned 44yrs. ~  1/13:  He is overdue for GI follow up & we will send referral/ reminder to DrPerry's office...  Hx of PROSTATITIS (ICD-601.0) - hx acute prostatitis treated in the 1990's by DrPeterson... ~  labs 6/10 showed PSA= 0.48 ~  labs 5/11 showed PSA= 0.28 ~  11/11: notes some irritation on penis- Rx Lotrisone cream trial. 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 followed by Vito Berger al at the [facility]: he has a spinal cord stimulator, and subcut pain pump w/ Fentanyl (reaction to MS)...  ~  2/04:  eval by Tora Perches w/ HNP L3-4 right w/ spinal stenosis, lateral recessed stenosis- had microdiscectomy & decompression. ~  2/08:  Myelogram by DrJJenkins w/ multilevel cervical DDD & stenosis- s/p ant cerv discectomy & fusion w/ plating at C3-4. ~  6/10: current meds from DrRauch includes--- Percocet 10mg - Tid; Tramadol 50mg - 1 to 2 Qid; Celebrex 200mg Bid; Lidoderm patches; Lyrica 150mg Bid; Cymbalta 60mg /d; Flexeril 10mg Tid; Clonidine0.1mg - 1.2 Bid for RSD... ~  11/10: pt reports Myelogram & CT scan showed 5 discs & spinal stenosis... he is to see Neurosurg soon. ~  5/11:  he reports monthly OV's w/ Pain Management & continues on 8 meds listed... ~  Imaging data in Epic showed CT Myelogram CSpine 3/12 by DrJJenkins> prev fusion C3-4 is satis, progressive spondylosis & facet degen at C2-3, unchanged spondy & DDD at C4-5, mild sp stenosis at several levels. ~  1/13:  He continues on same pain med regimen from DrRauch etal> we do not  have notes from  Pain management or Neurosurg...  VITAMIN D DEFICIENCT >> rec to take OTC Vit D supplement ~2000u daily... ~  Labs 1/13 showed Vit D level = 21... rec to start OTC Vit D supplement ~2000u daily...  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   Past Surgical History  Procedure Date  . Back surgery 1610,9604  . Morphine pump 2009    due to reaction to morphine  . Excision of lipoma from right olecranon area 11/2000    Dr. Zachery Dakins  . Microdiscectomy and decompression 05/2002    Dr. Shelle Iron  . C3-4 anterior cervical discectomy and fusion with plating at c3-4 05/2006    Dr. Lovell Sheehan  . Spinal cord stimulator implanted     for pain per Dr. Haskel Khan  . Subcut pain pump implanted   . Pain pump implantation     with fentanyl    Outpatient Encounter Prescriptions as of 10/18/2011  Medication Sig Dispense Refill  . aspirin 81 MG tablet Take 81 mg by mouth daily.        . celecoxib (CELEBREX) 200 MG capsule Take 200 mg by mouth 2 (two) times daily.        . cloNIDine (CATAPRES) 0.1 MG tablet Take 1 tablet (0.1 mg total) by mouth 2 (two) times daily.  180 tablet  3  . cyclobenzaprine (FLEXERIL) 10 MG tablet Take by mouth 2 (two) times daily as needed.       . CYMBALTA 60 MG capsule Take 60 mg by mouth daily.       . ENDOCET 10-325 MG per tablet Take 1 tablet by mouth 3 (three) times daily as needed.       [name] glimepiride (AMARYL) 2 MG tablet Take 1 tablet (2 mg total) by mouth daily before breakfast.  90 tablet  3  . lidocaine (LIDODERM) 5 % Place 1 patch onto the skin daily. Remove & Discard patch within 12 hours or as directed by MD       . LYRICA 150 MG capsule Take 150 mg by mouth 2 (two) times daily.       . metFORMIN (GLUCOPHAGE) 500 MG tablet Take 2 tablets by mouth two times daily  360 tablet  3  . simvastatin (ZOCOR) 40 MG tablet Take 1 tablet (40 mg total) by mouth at  bedtime.  90 tablet  3  . traMADol (ULTRAM) 50 MG tablet Take 50 mg by mouth 3 (three) times daily as needed.         Allergies  Allergen Reactions  . Codeine Phosphate     REACTION: itching  . Morphine And Related     Current Medications, Allergies, Past Medical History, Past Surgical History, Family History, and Social History were reviewed in [name] record.    Review of Systems         See HPI - .all other systems neg except as noted... The patient complains of dyspnea on exertion, peripheral edema, muscle weakness, and difficulty walking due to chr LBP (has cane).  The patient denies anorexia, fever, weight loss, weight gain, vision loss, decreased hearing, hoarseness, chest pain, syncope, prolonged cough, headaches, hemoptysis, abdominal pain, melena, hematochezia, severe indigestion/heartburn, hematuria, incontinence, suspicious skin lesions, transient blindness, depression, unusual weight change, abnormal bleeding, enlarged lymph nodes, and angioedema.     Objective:   Physical Exam  WD, Obese, 66 y/o WM in NAD... GENERAL:  Alert & oriented; pleasant & cooperative. HEENT:  [place]/AT, EOM-wnl, PERRLA, EACs-clear, TMs-wnl, NOSE-clear, THROAT-clear & wnl. NECK:  Supple w/ decrROM & ant cerv scar; no JVD; normal carotid impulses w/o bruits; no thyromegaly or nodules palpated; no lymphadenopathy. CHEST:  Clear to P & A; without wheezes/ rales/ or rhonchi heard... HEART:  Regular Rhythm; without murmurs/ rubs/ or gallops detected... ABDOMEN:  Obese, soft & nontender; decr bowel sounds; no organomegaly or masses palpated... EXT: without deformities, mild arthritic changes; no varicose veins/ +venous insuffic/ tr edema. NEURO:  CN's intact; motor testing normal (lim by pain); sensory testing normal; abn gait & balance fair... Ambulates w/ cane, scar of prev Lumbar surg, & has implanted sp cord stimulator. DERM:  Mod lipoma in right antecubital  area...  RADIOLOGY DATA:  Reviewed in the EPIC EMR & discussed w/ the patient...    >>CXR today (see above) NAD...  LABORATORY DATA:  Reviewed in the EPIC EMR & discussed w/ the patient...    >>Fasting labs today reviewed & pt notified...   Assessment:     COPD>  Former 3ppd smoker & quit 2003;  CXR w/ COPD/emphysema, NAD;  PFT w/ combined mild obstructive & restrictive dis;  He denies brewathing problems & doesn't want inhalers etc...  HBP>  Controlled on diet & Clonidine (per DrRauch);  States BP is only elev when he is in pain...  Ven Insuffic>  He knows to avoid sodium, elev legs, wear support hose etc...  Hyperlipid>  On Simva40 but w/ suboptimal medication & diet compliance;  Asked to take med daily...  DM>  On Metformin + Glimep as above;  Compliance has been poor & asked to take regularly so we can assess it's efficacy; A1c remains ~10 therefore increase meds, add Januvia.  Obesity>  Unable to exercise he says due to chronic back pain;  We reviewed wt reducing diet strategies ==> weight is down 22# "on diet" he says...  GI> Divertics, Polyps, Constip>  He is overdue for f/u colonoscopy & we will refer chart to GI...  LBP/ RSD/ Chronic Pain Syndrome>  This is his main problem that consumes his life 24/7; Managed & followed by Pain Management DrRauch etal...     Plan:     Patient's Medications  New Prescriptions   GLIMEPIRIDE (AMARYL) 4 MG TABLET    Take 1 tablet (4 mg total) by mouth daily before breakfast.   SITAGLIPTIN (JANUVIA) 100 MG TABLET    Take 1 tablet (100 mg total) by mouth daily.  Previous Medications   ASPIRIN 81 MG TABLET    Take 81 mg by mouth daily.     CELECOXIB (CELEBREX) 200 MG CAPSULE    Take 200 mg by mouth 2 (two) times daily.     CLONIDINE (CATAPRES) 0.1 MG TABLET    Take 1 tablet (0.1 mg total) by mouth 2 (two) times daily.   CYCLOBENZAPRINE (FLEXERIL) 10 MG TABLET    Take by mouth 2 (two) times daily as needed.    CYMBALTA 60 MG CAPSULE    Take  60 mg by mouth daily.    ENDOCET 10-325 MG PER TABLET    Take 1 tablet by mouth 3 (three) times daily as needed.    LIDOCAINE (LIDODERM) 5 %    Place 1 patch onto the skin daily. Remove & Discard patch within 12 hours or as directed by MD    LYRICA 150 MG CAPSULE    Take  150 mg by mouth 2 (two) times daily.    METFORMIN (GLUCOPHAGE) 500 MG TABLET    Take 2 tablets by mouth two times daily   SIMVASTATIN (ZOCOR) 40 MG TABLET    Take 1 tablet (40 mg total) by mouth at bedtime.   TRAMADOL (ULTRAM) 50 MG TABLET    Take 50 mg by mouth 3 (three) times daily as needed.   Modified Medications   No medications on file  Discontinued Medications   GLIMEPIRIDE (AMARYL) 2 MG TABLET    Take 1 tablet (2 mg total) by mouth daily before breakfast.

## 2011-10-18 NOTE — Patient Instructions (Addendum)
Today we updated your med list in our EPIC system...    Continue your current medications the same...  Keep up the great job w/ weight reduction!!!  Today we did your follow up blood work...    We will call you w/ the results when avail...  Call for any problems...  Let's plan a follow up visit in 6 months.Marland KitchenMarland Kitchen

## 2011-10-21 ENCOUNTER — Other Ambulatory Visit: Payer: Self-pay | Admitting: Pulmonary Disease

## 2011-10-21 ENCOUNTER — Ambulatory Visit: Payer: BC Managed Care – PPO | Admitting: Pulmonary Disease

## 2011-10-21 MED ORDER — GLIMEPIRIDE 4 MG PO TABS: 4.0000 mg | ORAL_TABLET | Freq: Every day | ORAL | Status: DC

## 2011-10-21 MED ORDER — SITAGLIPTIN PHOSPHATE 100 MG PO TABS: 100.0000 mg | ORAL_TABLET | Freq: Every day | ORAL | Status: DC

## 2012-01-06 ENCOUNTER — Encounter: Payer: Self-pay | Admitting: Internal Medicine

## 2012-01-19 ENCOUNTER — Ambulatory Visit (INDEPENDENT_AMBULATORY_CARE_PROVIDER_SITE_OTHER): Payer: Medicare Other

## 2012-01-19 DIAGNOSIS — Z23 Encounter for immunization: Secondary | ICD-10-CM

## 2012-01-20 DIAGNOSIS — Z23 Encounter for immunization: Secondary | ICD-10-CM

## 2012-02-10 ENCOUNTER — Other Ambulatory Visit: Payer: Self-pay | Admitting: Orthopedic Surgery

## 2012-02-16 ENCOUNTER — Encounter (HOSPITAL_COMMUNITY): Payer: Self-pay | Admitting: Pharmacy Technician

## 2012-02-21 ENCOUNTER — Encounter (HOSPITAL_COMMUNITY): Payer: Self-pay

## 2012-02-21 ENCOUNTER — Encounter (HOSPITAL_COMMUNITY)
Admission: RE | Admit: 2012-02-21 | Discharge: 2012-02-21 | Disposition: A | Discharge status: Home / Self Care | Payer: Medicare Other | Source: Ambulatory Visit | Attending: Orthopedic Surgery | Admitting: Orthopedic Surgery

## 2012-02-21 HISTORY — DX: Major depressive disorder, single episode, unspecified: F32.9

## 2012-02-21 HISTORY — DX: Depression, unspecified: F32.A

## 2012-02-21 HISTORY — DX: Pneumonia, unspecified organism: J18.9

## 2012-02-21 HISTORY — DX: Complex regional pain syndrome I, unspecified: G90.50

## 2012-02-21 HISTORY — DX: Mental disorder, not otherwise specified: F99

## 2012-02-21 LAB — URINALYSIS, ROUTINE W REFLEX MICROSCOPIC
Glucose, UA: NEGATIVE mg/dL
Ketones, ur: NEGATIVE mg/dL
Leukocytes, UA: NEGATIVE
Protein, ur: NEGATIVE mg/dL

## 2012-02-21 LAB — BASIC METABOLIC PANEL
GFR calc Af Amer: >90 mL/min (ref >90)
GFR calc non Af Amer: >90 mL/min (ref >90)
Potassium: 4.8 mEq/L (ref 3.5–5.1)
Sodium: 139 mEq/L (ref 135–145)

## 2012-02-21 LAB — CBC WITH DIFFERENTIAL/PLATELET
Basophils Relative: 0 % (ref 0–1)
Hemoglobin: 12.6 g/dL — ABNORMAL LOW (ref 13.0–17.0)
Lymphocytes Relative: 23 % (ref 12–46)
Lymphs Abs: 2.5 10*3/uL (ref 0.7–4.0)
Monocytes Relative: 4 % (ref 3–12)
Neutro Abs: 7.4 10*3/uL (ref 1.7–7.7)
Neutrophils Relative %: 70 % (ref 43–77)
RBC: 4.37 MIL/uL (ref 4.22–5.81)
WBC: 10.5 10*3/uL (ref 4.0–10.5)

## 2012-02-21 LAB — TYPE AND SCREEN: ABO/RH(D): O POS

## 2012-02-21 LAB — SURGICAL PCR SCREEN
MRSA, PCR: NEGATIVE
Staphylococcus aureus: POSITIVE — AB

## 2012-02-21 LAB — PROTIME-INR: INR: 1.02 (ref 0.00–1.49)

## 2012-02-21 NOTE — Progress Notes (Signed)
Patient denied having a stress test, cardiac cath, or sleep study. PCP is Dr. Alroy Dust and patient see Dr. Roderic Ovens for pain management.

## 2012-02-28 MED ORDER — CEFAZOLIN SODIUM-DEXTROSE 2-3 GM-% IV SOLR
2.0000 g | INTRAVENOUS | Status: AC
  Administered 2012-02-29: 2 g via INTRAVENOUS
  Filled 2012-02-28: qty 50

## 2012-02-28 NOTE — H&P (Signed)
TOTAL KNEE ADMISSION H&P  Patient is being admitted for right total knee arthroplasty.  Subjective:  Chief Complaint:right knee pain.  HPI: James Parsons, 66 y.o. male, has a history of pain and functional disability in the right knee due to arthritis and has failed non-surgical conservative treatments for greater than 12 weeks to includeNSAID's and/or analgesics and activity modification.  Onset of symptoms was gradual, starting 1 years ago with gradually worsening course since that time. The patient noted no past surgery on the right knee(s).  Patient currently rates pain in the right knee(s) at 7 out of 10 with activity. Patient has worsening of pain with activity and weight bearing, pain that interferes with activities of daily living and joint swelling.  Patient has evidence of periarticular osteophytes and joint space narrowing by imaging studies. This patient has had several back surgeries and uses a fentanyl pump for pain. There is no active infection.  Patient Active Problem List   Diagnosis Date Noted  . Chronic pain syndrome 04/22/2011  . HYPERTENSION, BORDERLINE 03/20/2010  . COPD 03/09/2009  . LIPOMA 09/12/2008  . REFLEX SYMPATHETIC DYSTROPHY 09/12/2008  . VENOUS INSUFFICIENCY 09/12/2008  . DIVERTICULOSIS OF COLON 09/12/2008  . PROSTATITIS, ACUTE 09/12/2008  . DIABETES MELLITUS 09/10/2008  . COLONIC POLYPS 02/22/2007  . HYPERLIPIDEMIA 02/22/2007  . OBESITY 02/22/2007  . CONSTIPATION 02/22/2007  . LOW BACK PAIN SYNDROME 02/22/2007   Past Medical History  Diagnosis Date  . Anemia   . Arthritis   . Blood transfusion   . Cancer   . Clotting disorder   . Diabetes mellitus   . COPD (chronic obstructive pulmonary disease)   . CHF (congestive heart failure)   . Borderline hypertension   . Venous insufficiency   . Hyperlipidemia   . Obesity   . Diverticulosis of colon   . Hx of colonic polyps   . Low back pain   . Lipoma   . RSD (reflex sympathetic dystrophy)   .  Pneumonia   . Mental disorder   . Depression     hx of     Past Surgical History  Procedure Date  . Back surgery [id number]  . Morphine pump 2009    due to reaction to morphine  . Excision of lipoma from right olecranon area 11/2000    Dr. Zachery Dakins  . Microdiscectomy and decompression 05/2002    Dr. Shelle Iron  . C3-4 anterior cervical discectomy and fusion with plating at c3-4 05/2006    Dr. Lovell Sheehan  . Spinal cord stimulator implanted     for pain per Dr. Haskel Khan  . Subcut pain pump implanted   . Pain pump implantation     with fentanyl  . Tonsillectomy   . Colonoscopy w/ polypectomy   . Knee arthroscopy     right knee    No prescriptions prior to admission   Allergies  Allergen Reactions  . Codeine Phosphate     REACTION: itching  . Morphine And Related     History  Substance Use Topics  . Smoking status: Former Smoker -- 3.0 packs/day    Types: Cigarettes    Quit date: 05/25/2002  . Smokeless tobacco: Never Used  . Alcohol Use: No    Family History  Problem Relation Age of Onset  . Cancer Father      Review of Systems  Constitutional: Negative.   HENT: Negative.   Eyes: Negative.   Respiratory: Negative.   Cardiovascular: Negative.   Gastrointestinal: Negative.   Genitourinary: Negative.  Musculoskeletal: Positive for back pain and joint pain.  Skin: Negative.   Neurological: Negative.   Endo/Heme/Allergies: Negative.   Psychiatric/Behavioral: Negative.     Objective:  Physical Exam  Constitutional: He is oriented to person, place, and time. He appears well-developed and well-nourished.  HENT:  Head: Normocephalic.  Eyes: Pupils are equal, round, and reactive to light.  Cardiovascular: Normal heart sounds.   Respiratory: Breath sounds normal.  Musculoskeletal:       Right knee: He exhibits decreased range of motion and swelling. tenderness found. Medial joint line tenderness noted.  Neurological: He is alert and oriented to person, place, and time.    Psychiatric: He has a normal mood and affect.    Vital signs in last 24 hours:    Labs:   Estimated Body mass index is 37.90 kg/(m^2) as calculated from the following:   Height as of 10/18/11: 5\' 7" (1.702 m).   Weight as of 10/18/11: 242 lb(109.77 kg).   Imaging Review Plain radiographs demonstrate severe degenerative joint disease of the right knee(s). The overall alignment isneutral. The bone quality appears to be adequate for age and reported activity level.  Assessment/Plan:  End stage arthritis, right knee   The patient history, physical examination, clinical judgment of the provider and imaging studies are consistent with end stage degenerative joint disease of the right knee(s) and total knee arthroplasty is deemed medically necessary. The treatment options including medical management, injection therapy arthroscopy and arthroplasty were discussed at length. The risks and benefits of total knee arthroplasty were presented and reviewed. The risks due to aseptic loosening, infection, stiffness, patella tracking problems, thromboembolic complications and other imponderables were discussed. The patient acknowledged the explanation, agreed to proceed with the plan and consent was signed. Patient is being admitted for inpatient treatment for surgery, pain control, PT, OT, prophylactic antibiotics, VTE prophylaxis, progressive ambulation and ADL's and discharge planning. The patient is planning to be discharged home with home health services

## 2012-02-29 ENCOUNTER — Inpatient Hospital Stay (HOSPITAL_COMMUNITY): Payer: Medicare Other | Admitting: Certified Registered"

## 2012-02-29 ENCOUNTER — Inpatient Hospital Stay (HOSPITAL_COMMUNITY)
Admission: RE | Admit: 2012-02-29 | Discharge: 2012-03-02 | DRG: 470 | Disposition: A | Discharge status: Home Health | Payer: Medicare Other | Source: Ambulatory Visit | Attending: Orthopedic Surgery | Admitting: Orthopedic Surgery

## 2012-02-29 ENCOUNTER — Encounter (HOSPITAL_COMMUNITY)
Admission: RE | Disposition: A | Discharge status: Home Health | Payer: Self-pay | Source: Ambulatory Visit | Attending: Orthopedic Surgery

## 2012-02-29 ENCOUNTER — Encounter (HOSPITAL_COMMUNITY): Payer: Self-pay | Admitting: Certified Registered"

## 2012-02-29 DIAGNOSIS — Z7982 Long term (current) use of aspirin: Secondary | ICD-10-CM

## 2012-02-29 DIAGNOSIS — E669 Obesity, unspecified: Secondary | ICD-10-CM | POA: Diagnosis present

## 2012-02-29 DIAGNOSIS — Z6837 Body mass index (BMI) 37.0-37.9, adult: Secondary | ICD-10-CM

## 2012-02-29 DIAGNOSIS — G894 Chronic pain syndrome: Secondary | ICD-10-CM | POA: Diagnosis present

## 2012-02-29 DIAGNOSIS — Z79899 Other long term (current) drug therapy: Secondary | ICD-10-CM

## 2012-02-29 DIAGNOSIS — M1711 Unilateral primary osteoarthritis, right knee: Secondary | ICD-10-CM

## 2012-02-29 DIAGNOSIS — F3289 Other specified depressive episodes: Secondary | ICD-10-CM | POA: Diagnosis present

## 2012-02-29 DIAGNOSIS — Z01812 Encounter for preprocedural laboratory examination: Secondary | ICD-10-CM

## 2012-02-29 DIAGNOSIS — M1710 Unilateral primary osteoarthritis, unspecified knee: Principal | ICD-10-CM | POA: Diagnosis present

## 2012-02-29 DIAGNOSIS — E785 Hyperlipidemia, unspecified: Secondary | ICD-10-CM | POA: Diagnosis present

## 2012-02-29 DIAGNOSIS — E119 Type 2 diabetes mellitus without complications: Secondary | ICD-10-CM | POA: Diagnosis present

## 2012-02-29 DIAGNOSIS — K5730 Diverticulosis of large intestine without perforation or abscess without bleeding: Secondary | ICD-10-CM | POA: Diagnosis present

## 2012-02-29 DIAGNOSIS — J4489 Other specified chronic obstructive pulmonary disease: Secondary | ICD-10-CM | POA: Diagnosis present

## 2012-02-29 DIAGNOSIS — F329 Major depressive disorder, single episode, unspecified: Secondary | ICD-10-CM | POA: Diagnosis present

## 2012-02-29 DIAGNOSIS — G9059 Complex regional pain syndrome I of other specified site: Secondary | ICD-10-CM | POA: Diagnosis present

## 2012-02-29 DIAGNOSIS — Z87891 Personal history of nicotine dependence: Secondary | ICD-10-CM

## 2012-02-29 DIAGNOSIS — J449 Chronic obstructive pulmonary disease, unspecified: Secondary | ICD-10-CM | POA: Diagnosis present

## 2012-02-29 HISTORY — PX: TOTAL KNEE ARTHROPLASTY: SHX125

## 2012-02-29 LAB — GLUCOSE, CAPILLARY
Glucose-Capillary: 171 mg/dL — ABNORMAL HIGH (ref 70–99)
Glucose-Capillary: 261 mg/dL — ABNORMAL HIGH (ref 70–99)

## 2012-02-29 SURGERY — ARTHROPLASTY, KNEE, TOTAL
Anesthesia: General | Site: Knee | Laterality: Right | Wound class: Clean

## 2012-02-29 MED ORDER — METFORMIN HCL 500 MG PO TABS
500.0000 mg | ORAL_TABLET | Freq: Every day | ORAL | Status: DC
  Administered 2012-03-01 – 2012-03-02 (×2): 500 mg via ORAL
  Filled 2012-02-29 (×3): qty 1

## 2012-02-29 MED ORDER — CYCLOBENZAPRINE HCL 10 MG PO TABS
10.0000 mg | ORAL_TABLET | Freq: Two times a day (BID) | ORAL | Status: DC | PRN
  Administered 2012-03-02: 10 mg via ORAL
  Filled 2012-02-29: qty 1

## 2012-02-29 MED ORDER — OXYCODONE-ACETAMINOPHEN 10-325 MG PO TABS: 1.0000 | ORAL_TABLET | Freq: Three times a day (TID) | ORAL | Status: DC

## 2012-02-29 MED ORDER — INSULIN ASPART 100 UNIT/ML ~~LOC~~ SOLN
0.0000 [IU] | Freq: Three times a day (TID) | SUBCUTANEOUS | Status: DC
  Administered 2012-03-01: 3 [IU] via SUBCUTANEOUS
  Administered 2012-03-01: 5 [IU] via SUBCUTANEOUS
  Administered 2012-03-01: 8 [IU] via SUBCUTANEOUS
  Administered 2012-03-02: 2 [IU] via SUBCUTANEOUS

## 2012-02-29 MED ORDER — ONDANSETRON HCL 4 MG/2ML IJ SOLN: 4.0000 mg | Freq: Four times a day (QID) | INTRAMUSCULAR | Status: DC | PRN

## 2012-02-29 MED ORDER — LIDOCAINE HCL (CARDIAC) 20 MG/ML IV SOLN
INTRAVENOUS | Status: DC | PRN
  Administered 2012-02-29: 80 mg via INTRAVENOUS

## 2012-02-29 MED ORDER — ACETAMINOPHEN 650 MG RE SUPP: 650.0000 mg | Freq: Four times a day (QID) | RECTAL | Status: DC | PRN

## 2012-02-29 MED ORDER — PREGABALIN 50 MG PO CAPS: 150.0000 mg | ORAL_CAPSULE | Freq: Two times a day (BID) | ORAL | Status: DC

## 2012-02-29 MED ORDER — BISACODYL 5 MG PO TBEC
5.0000 mg | DELAYED_RELEASE_TABLET | Freq: Every day | ORAL | Status: DC | PRN
  Administered 2012-03-01: 5 mg via ORAL

## 2012-02-29 MED ORDER — FENTANYL CITRATE 0.05 MG/ML IJ SOLN
50.0000 ug/h | INTRAMUSCULAR | Status: DC
  Filled 2012-02-29: qty 100

## 2012-02-29 MED ORDER — DIPHENHYDRAMINE HCL 50 MG/ML IJ SOLN: 12.5000 mg | Freq: Four times a day (QID) | INTRAMUSCULAR | Status: DC | PRN

## 2012-02-29 MED ORDER — LACTATED RINGERS IV SOLN
INTRAVENOUS | Status: DC | PRN
  Administered 2012-02-29 (×2): via INTRAVENOUS

## 2012-02-29 MED ORDER — CELECOXIB 200 MG PO CAPS
200.0000 mg | ORAL_CAPSULE | Freq: Two times a day (BID) | ORAL | Status: DC
  Administered 2012-02-29 – 2012-03-01 (×3): 200 mg via ORAL
  Filled 2012-02-29 (×5): qty 1

## 2012-02-29 MED ORDER — PHENYLEPHRINE HCL 10 MG/ML IJ SOLN
INTRAMUSCULAR | Status: DC | PRN
  Administered 2012-02-29: 80 ug via INTRAVENOUS

## 2012-02-29 MED ORDER — ACETAMINOPHEN 325 MG PO TABS
325.0000 mg | ORAL_TABLET | Freq: Three times a day (TID) | ORAL | Status: DC
  Administered 2012-03-02: 325 mg via ORAL
  Filled 2012-02-29: qty 1

## 2012-02-29 MED ORDER — LIDOCAINE 5 % EX PTCH
1.0000 | MEDICATED_PATCH | Freq: Every day | CUTANEOUS | Status: DC | PRN
  Filled 2012-02-29: qty 1

## 2012-02-29 MED ORDER — ALUM & MAG HYDROXIDE-SIMETH 200-200-20 MG/5ML PO SUSP: 30.0000 mL | ORAL | Status: DC | PRN

## 2012-02-29 MED ORDER — HYDROMORPHONE 0.3 MG/ML IV SOLN
INTRAVENOUS | Status: AC
  Administered 2012-02-29: 0.9 mg
  Administered 2012-03-01: 01:00:00
  Filled 2012-02-29: qty 25

## 2012-02-29 MED ORDER — ROCURONIUM BROMIDE 100 MG/10ML IV SOLN
INTRAVENOUS | Status: DC | PRN
  Administered 2012-02-29: 50 mg via INTRAVENOUS

## 2012-02-29 MED ORDER — DIPHENHYDRAMINE HCL 12.5 MG/5ML PO ELIX: 12.5000 mg | ORAL_SOLUTION | ORAL | Status: DC | PRN

## 2012-02-29 MED ORDER — ACETAMINOPHEN 10 MG/ML IV SOLN
INTRAVENOUS | Status: DC | PRN
  Administered 2012-02-29: 1000 mg via INTRAVENOUS

## 2012-02-29 MED ORDER — EPHEDRINE SULFATE 50 MG/ML IJ SOLN
INTRAMUSCULAR | Status: DC | PRN
  Administered 2012-02-29 (×2): 5 mg via INTRAVENOUS

## 2012-02-29 MED ORDER — MAGNESIUM HYDROXIDE 400 MG/5ML PO SUSP: 30.0000 mL | Freq: Every day | ORAL | Status: DC | PRN

## 2012-02-29 MED ORDER — KCL IN DEXTROSE-NACL 20-5-0.45 MEQ/L-%-% IV SOLN
INTRAVENOUS | Status: DC
  Administered 2012-02-29 – 2012-03-01 (×3): via INTRAVENOUS
  Filled 2012-02-29 (×7): qty 1000

## 2012-02-29 MED ORDER — ONDANSETRON HCL 4 MG/2ML IJ SOLN
INTRAMUSCULAR | Status: DC | PRN
  Administered 2012-02-29: 4 mg via INTRAVENOUS

## 2012-02-29 MED ORDER — DIPHENHYDRAMINE HCL 12.5 MG/5ML PO ELIX: 12.5000 mg | ORAL_SOLUTION | Freq: Four times a day (QID) | ORAL | Status: DC | PRN

## 2012-02-29 MED ORDER — FENTANYL CITRATE 0.05 MG/ML IJ SOLN
INTRAMUSCULAR | Status: DC | PRN
  Administered 2012-02-29: 50 ug via INTRAVENOUS

## 2012-02-29 MED ORDER — FENTANYL CITRATE 0.05 MG/ML IJ SOLN
INTRAMUSCULAR | Status: AC
  Filled 2012-02-29: qty 2

## 2012-02-29 MED ORDER — ACETAMINOPHEN 325 MG PO TABS
650.0000 mg | ORAL_TABLET | Freq: Four times a day (QID) | ORAL | Status: DC | PRN
  Filled 2012-02-29: qty 1

## 2012-02-29 MED ORDER — SODIUM CHLORIDE 0.9 % IR SOLN
Status: DC | PRN
  Administered 2012-02-29: 3000 mL

## 2012-02-29 MED ORDER — ARTIFICIAL TEARS OP OINT
TOPICAL_OINTMENT | OPHTHALMIC | Status: DC | PRN
  Administered 2012-02-29: 1 via OPHTHALMIC

## 2012-02-29 MED ORDER — HYDROMORPHONE HCL PF 1 MG/ML IJ SOLN
0.2500 mg | INTRAMUSCULAR | Status: DC | PRN
  Administered 2012-02-29 (×3): 0.5 mg via INTRAVENOUS

## 2012-02-29 MED ORDER — PROMETHAZINE HCL 25 MG/ML IJ SOLN: 6.2500 mg | INTRAMUSCULAR | Status: DC | PRN

## 2012-02-29 MED ORDER — ACETAMINOPHEN 10 MG/ML IV SOLN
INTRAVENOUS | Status: AC
  Filled 2012-02-29: qty 100

## 2012-02-29 MED ORDER — CHLORHEXIDINE GLUCONATE 4 % EX LIQD
60.0000 mL | Freq: Once | CUTANEOUS | Status: DC
Start: 2012-02-29 — End: 2012-02-29

## 2012-02-29 MED ORDER — GLIMEPIRIDE 4 MG PO TABS
4.0000 mg | ORAL_TABLET | Freq: Every day | ORAL | Status: DC
  Administered 2012-03-01 – 2012-03-02 (×2): 4 mg via ORAL
  Filled 2012-02-29 (×3): qty 1

## 2012-02-29 MED ORDER — MENTHOL 3 MG MT LOZG: 1.0000 | LOZENGE | OROMUCOSAL | Status: DC | PRN

## 2012-02-29 MED ORDER — SODIUM CHLORIDE 0.9 % IJ SOLN: 9.0000 mL | INTRAMUSCULAR | Status: DC | PRN

## 2012-02-29 MED ORDER — HYDROMORPHONE 0.3 MG/ML IV SOLN
INTRAVENOUS | Status: DC
  Administered 2012-02-29: 0.9 mg via INTRAVENOUS
  Administered 2012-02-29: 30 mL via INTRAVENOUS
  Administered 2012-03-01: 0.6 mg via INTRAVENOUS
  Administered 2012-03-01: 0.3 mg via INTRAVENOUS
  Administered 2012-03-01: 1.2 mg via INTRAVENOUS
  Administered 2012-03-01: 4.5 mg via INTRAVENOUS
  Administered 2012-03-01: 2.65 mg via INTRAVENOUS
  Administered 2012-03-02: 01:00:00 via INTRAVENOUS
  Administered 2012-03-02: 3 mg via INTRAVENOUS
  Administered 2012-03-02: 0.36 mg via INTRAVENOUS
  Filled 2012-02-29 (×2): qty 25

## 2012-02-29 MED ORDER — OXYCODONE HCL 5 MG PO TABS: 5.0000 mg | ORAL_TABLET | ORAL | Status: DC | PRN

## 2012-02-29 MED ORDER — MIDAZOLAM HCL 2 MG/2ML IJ SOLN
INTRAMUSCULAR | Status: AC
  Filled 2012-02-29: qty 2

## 2012-02-29 MED ORDER — MIDAZOLAM HCL 2 MG/2ML IJ SOLN
1.0000 mg | INTRAMUSCULAR | Status: DC | PRN
  Administered 2012-02-29: 2 mg via INTRAVENOUS

## 2012-02-29 MED ORDER — BUPIVACAINE-EPINEPHRINE PF 0.5-1:200000 % IJ SOLN
INTRAMUSCULAR | Status: DC | PRN
  Administered 2012-02-29: 25 mL

## 2012-02-29 MED ORDER — DULOXETINE HCL 60 MG PO CPEP
60.0000 mg | ORAL_CAPSULE | Freq: Every day | ORAL | Status: DC
  Administered 2012-03-01: 60 mg via ORAL
  Filled 2012-02-29 (×2): qty 1

## 2012-02-29 MED ORDER — ACETAMINOPHEN 10 MG/ML IV SOLN
1000.0000 mg | Freq: Once | INTRAVENOUS | Status: DC
  Filled 2012-02-29: qty 100

## 2012-02-29 MED ORDER — CLONIDINE HCL 0.1 MG PO TABS
0.1000 mg | ORAL_TABLET | Freq: Two times a day (BID) | ORAL | Status: DC
  Administered 2012-02-29 – 2012-03-01 (×3): 0.1 mg via ORAL
  Filled 2012-02-29 (×5): qty 1

## 2012-02-29 MED ORDER — OXYCODONE HCL 5 MG PO TABS
10.0000 mg | ORAL_TABLET | Freq: Three times a day (TID) | ORAL | Status: DC
  Administered 2012-03-02: 10 mg via ORAL
  Filled 2012-02-29 (×2): qty 2

## 2012-02-29 MED ORDER — SODIUM CHLORIDE 0.9 % IV SOLN
40.0000 ug/h | INTRAVENOUS | Status: DC
  Filled 2012-02-29: qty 50

## 2012-02-29 MED ORDER — OXYCODONE HCL 5 MG PO TABS: 5.0000 mg | ORAL_TABLET | Freq: Once | ORAL | Status: DC | PRN

## 2012-02-29 MED ORDER — METOCLOPRAMIDE HCL 10 MG PO TABS: 5.0000 mg | ORAL_TABLET | Freq: Three times a day (TID) | ORAL | Status: DC | PRN

## 2012-02-29 MED ORDER — NALOXONE HCL 0.4 MG/ML IJ SOLN: 0.4000 mg | INTRAMUSCULAR | Status: DC | PRN

## 2012-02-29 MED ORDER — DEXAMETHASONE SODIUM PHOSPHATE 4 MG/ML IJ SOLN
INTRAMUSCULAR | Status: DC | PRN
  Administered 2012-02-29: 4 mg

## 2012-02-29 MED ORDER — FENTANYL CITRATE 0.05 MG/ML IJ SOLN
50.0000 ug | Freq: Once | INTRAMUSCULAR | Status: AC
  Administered 2012-02-29: 100 ug via INTRAVENOUS

## 2012-02-29 MED ORDER — PROPOFOL 10 MG/ML IV BOLUS
INTRAVENOUS | Status: DC | PRN
  Administered 2012-02-29: 160 mg via INTRAVENOUS

## 2012-02-29 MED ORDER — ONDANSETRON HCL 4 MG PO TABS: 4.0000 mg | ORAL_TABLET | Freq: Four times a day (QID) | ORAL | Status: DC | PRN

## 2012-02-29 MED ORDER — HYDROMORPHONE HCL PF 1 MG/ML IJ SOLN
INTRAMUSCULAR | Status: AC
  Filled 2012-02-29: qty 2

## 2012-02-29 MED ORDER — SIMVASTATIN 40 MG PO TABS
40.0000 mg | ORAL_TABLET | Freq: Every day | ORAL | Status: DC
  Administered 2012-02-29 – 2012-03-01 (×2): 40 mg via ORAL
  Filled 2012-02-29 (×3): qty 1

## 2012-02-29 MED ORDER — CEFUROXIME SODIUM 1.5 G IJ SOLR
INTRAMUSCULAR | Status: AC
  Filled 2012-02-29: qty 1.5

## 2012-02-29 MED ORDER — INSULIN ASPART 100 UNIT/ML ~~LOC~~ SOLN
0.0000 [IU] | Freq: Every day | SUBCUTANEOUS | Status: DC
  Administered 2012-02-29: 3 [IU] via SUBCUTANEOUS

## 2012-02-29 MED ORDER — METOCLOPRAMIDE HCL 5 MG/ML IJ SOLN: 5.0000 mg | Freq: Three times a day (TID) | INTRAMUSCULAR | Status: DC | PRN

## 2012-02-29 MED ORDER — LACTATED RINGERS IV SOLN
INTRAVENOUS | Status: DC
  Administered 2012-02-29: 12:00:00 via INTRAVENOUS

## 2012-02-29 MED ORDER — ACETAMINOPHEN 10 MG/ML IV SOLN
1000.0000 mg | Freq: Four times a day (QID) | INTRAVENOUS | Status: AC
  Administered 2012-02-29 – 2012-03-01 (×4): 1000 mg via INTRAVENOUS
  Filled 2012-02-29 (×4): qty 100

## 2012-02-29 MED ORDER — LINAGLIPTIN 5 MG PO TABS
5.0000 mg | ORAL_TABLET | Freq: Every day | ORAL | Status: DC
  Administered 2012-03-01: 5 mg via ORAL
  Filled 2012-02-29 (×2): qty 1

## 2012-02-29 MED ORDER — PHENOL 1.4 % MT LIQD: 1.0000 | OROMUCOSAL | Status: DC | PRN

## 2012-02-29 MED ORDER — FLEET ENEMA 7-19 GM/118ML RE ENEM: 1.0000 | ENEMA | Freq: Once | RECTAL | Status: AC | PRN

## 2012-02-29 MED ORDER — ASPIRIN EC 325 MG PO TBEC
325.0000 mg | DELAYED_RELEASE_TABLET | Freq: Two times a day (BID) | ORAL | Status: DC
  Administered 2012-02-29 – 2012-03-01 (×3): 325 mg via ORAL
  Filled 2012-02-29 (×5): qty 1

## 2012-02-29 MED ORDER — OXYCODONE HCL 5 MG/5ML PO SOLN: 5.0000 mg | Freq: Once | ORAL | Status: DC | PRN

## 2012-02-29 MED ORDER — DEXTROSE-NACL 5-0.45 % IV SOLN: INTRAVENOUS | Status: DC

## 2012-02-29 MED ORDER — PREGABALIN 50 MG PO CAPS
150.0000 mg | ORAL_CAPSULE | Freq: Two times a day (BID) | ORAL | Status: DC
  Administered 2012-02-29 – 2012-03-01 (×3): 150 mg via ORAL
  Filled 2012-02-29 (×3): qty 3

## 2012-02-29 MED ORDER — TRAMADOL HCL 50 MG PO TABS: 50.0000 mg | ORAL_TABLET | Freq: Three times a day (TID) | ORAL | Status: DC | PRN

## 2012-02-29 MED ORDER — CEFUROXIME SODIUM 1.5 G IJ SOLR
INTRAMUSCULAR | Status: DC | PRN
  Administered 2012-02-29: 1.5 g

## 2012-02-29 SURGICAL SUPPLY — 55 items
BANDAGE ELASTIC 6 VELCRO ST LF (GAUZE/BANDAGES/DRESSINGS) ×1 IMPLANT
BANDAGE ESMARK 6X9 LF (GAUZE/BANDAGES/DRESSINGS) ×1 IMPLANT
BLADE SAG 18X100X1.27 (BLADE) ×2 IMPLANT
BLADE SAW SGTL 13X75X1.27 (BLADE) ×2 IMPLANT
BLADE SURG ROTATE 9660 (MISCELLANEOUS) IMPLANT
BNDG CMPR 9X6 STRL LF SNTH (GAUZE/BANDAGES/DRESSINGS) ×1
BNDG CMPR MED 10X6 ELC LF (GAUZE/BANDAGES/DRESSINGS) ×1
BNDG ELASTIC 6X10 VLCR STRL LF (GAUZE/BANDAGES/DRESSINGS) ×2 IMPLANT
BNDG ESMARK 6X9 LF (GAUZE/BANDAGES/DRESSINGS) ×2
BOWL SMART MIX CTS (DISPOSABLE) ×2 IMPLANT
CEMENT HV SMART SET (Cement) ×4 IMPLANT
CLOTH BEACON ORANGE TIMEOUT ST (SAFETY) ×2 IMPLANT
COVER BACK TABLE 24X17X13 BIG (DRAPES) IMPLANT
COVER SURGICAL LIGHT HANDLE (MISCELLANEOUS) ×2 IMPLANT
CUFF TOURNIQUET SINGLE 34IN LL (TOURNIQUET CUFF) IMPLANT
CUFF TOURNIQUET SINGLE 44IN (TOURNIQUET CUFF) IMPLANT
DRAPE EXTREMITY T 121X128X90 (DRAPE) ×2 IMPLANT
DRAPE U-SHAPE 47X51 STRL (DRAPES) ×2 IMPLANT
DRSG PAD ABDOMINAL 8X10 ST (GAUZE/BANDAGES/DRESSINGS) ×1 IMPLANT
DURAPREP 26ML APPLICATOR (WOUND CARE) ×2 IMPLANT
ELECT REM PT RETURN 9FT ADLT (ELECTROSURGICAL) ×2
ELECTRODE REM PT RTRN 9FT ADLT (ELECTROSURGICAL) ×1 IMPLANT
EVACUATOR 1/8 PVC DRAIN (DRAIN) ×2 IMPLANT
GAUZE XEROFORM 1X8 LF (GAUZE/BANDAGES/DRESSINGS) ×2 IMPLANT
GLOVE BIO SURGEON STRL SZ7 (GLOVE) ×2 IMPLANT
GLOVE BIO SURGEON STRL SZ7.5 (GLOVE) ×2 IMPLANT
GLOVE BIOGEL PI IND STRL 7.0 (GLOVE) ×1 IMPLANT
GLOVE BIOGEL PI IND STRL 8 (GLOVE) ×1 IMPLANT
GLOVE BIOGEL PI INDICATOR 7.0 (GLOVE) ×1
GLOVE BIOGEL PI INDICATOR 8 (GLOVE) ×1
GOWN PREVENTION PLUS XLARGE (GOWN DISPOSABLE) ×2 IMPLANT
GOWN STRL NON-REIN LRG LVL3 (GOWN DISPOSABLE) ×4 IMPLANT
HANDPIECE INTERPULSE COAX TIP (DISPOSABLE) ×2
HOOD PEEL AWAY FACE SHEILD DIS (HOOD) ×6 IMPLANT
KIT BASIN OR (CUSTOM PROCEDURE TRAY) ×2 IMPLANT
KIT ROOM TURNOVER OR (KITS) ×2 IMPLANT
MANIFOLD NEPTUNE II (INSTRUMENTS) ×2 IMPLANT
NS IRRIG 1000ML POUR BTL (IV SOLUTION) ×2 IMPLANT
PACK TOTAL JOINT (CUSTOM PROCEDURE TRAY) ×2 IMPLANT
PAD ARMBOARD 7.5X6 YLW CONV (MISCELLANEOUS) ×4 IMPLANT
PADDING CAST COTTON 6X4 STRL (CAST SUPPLIES) ×2 IMPLANT
SET HNDPC FAN SPRY TIP SCT (DISPOSABLE) ×1 IMPLANT
SPONGE GAUZE 4X4 12PLY (GAUZE/BANDAGES/DRESSINGS) ×3 IMPLANT
STAPLER VISISTAT 35W (STAPLE) ×2 IMPLANT
SUCTION FRAZIER TIP 10 FR DISP (SUCTIONS) ×2 IMPLANT
SUT VIC AB 0 CTX 36 (SUTURE) ×2
SUT VIC AB 0 CTX36XBRD ANTBCTR (SUTURE) ×1 IMPLANT
SUT VIC AB 1 CTX 36 (SUTURE) ×2
SUT VIC AB 1 CTX36XBRD ANBCTR (SUTURE) ×1 IMPLANT
SUT VIC AB 2-0 CT1 27 (SUTURE) ×2
SUT VIC AB 2-0 CT1 TAPERPNT 27 (SUTURE) ×1 IMPLANT
TOWEL OR 17X24 6PK STRL BLUE (TOWEL DISPOSABLE) ×2 IMPLANT
TOWEL OR 17X26 10 PK STRL BLUE (TOWEL DISPOSABLE) ×2 IMPLANT
TRAY FOLEY CATH 14FR (SET/KITS/TRAYS/PACK) IMPLANT
WATER STERILE IRR 1000ML POUR (IV SOLUTION) ×6 IMPLANT

## 2012-02-29 NOTE — Anesthesia Postprocedure Evaluation (Signed)
  Anesthesia Post-op Note  Patient: James Parsons  Procedure(s) Performed: Procedure(s) (LRB) with comments: TOTAL KNEE ARTHROPLASTY (Right)  Patient Location: PACU  Anesthesia Type:GA combined with regional for post-op pain  Level of Consciousness: awake  Airway and Oxygen Therapy: Patient Spontanous Breathing  Post-op Pain: mild  Post-op Assessment: Post-op Vital signs reviewed, Patient's Cardiovascular Status Stable, Respiratory Function Stable, Patent Airway, No signs of Nausea or vomiting and Pain level controlled  Post-op Vital Signs: stable  Complications: No apparent anesthesia complications

## 2012-02-29 NOTE — Anesthesia Procedure Notes (Addendum)
Anesthesia Regional Block:  Femoral nerve block  Pre-Anesthetic Checklist: ,, timeout performed, Correct Patient, Correct Site, Correct Laterality, Correct Procedure, Correct Position, site marked, Risks and benefits discussed,  Surgical consent,  Pre-op evaluation,  At surgeon's request and post-op pain management  Laterality: Right  Prep: chloraprep       Needles:  Injection technique: Single-shot  Needle Type: Echogenic Stimulator Needle     Needle Length: 5cm 5 cm Needle Gauge: 22 and 22 G    Additional Needles:  Procedures: nerve stimulator Femoral nerve block  Nerve Stimulator or Paresthesia:  Response: 0.48 mA,   Additional Responses:   Narrative:  Start time: 02/29/2012 12:24 PM End time: 02/29/2012 12:31 PM Injection made incrementally with aspirations every 5 mL. Anesthesiologist: Dr Gypsy Balsam  Additional Notes: [id number] R FNB POP CHG prep, sterile tech #22 stim/echo needle w/stim down to .48ma Multiple neg asp Marc .5% w/epi 25cc + decadron 4mg  infiltrated No compl Dr Gypsy Balsam   Procedure Name: Intubation Date/Time: 02/29/2012 1:02 PM Performed by: Jerilee Hoh Pre-anesthesia Checklist: Patient identified, Emergency Drugs available, Suction available and Patient being monitored Patient Re-evaluated:Patient Re-evaluated prior to inductionOxygen Delivery Method: Circle system utilized Preoxygenation: Pre-oxygenation with 100% oxygen Intubation Type: IV induction Ventilation: Mask ventilation without difficulty and Oral airway inserted - appropriate to patient size Laryngoscope Size: Mac and 4 Grade View: Grade I Tube type: Oral Tube size: 7.5 mm Number of attempts: 1 Airway Equipment and Method: Stylet Placement Confirmation: ETT inserted through vocal cords under direct vision,  positive ETCO2 and breath sounds checked- equal and bilateral Secured at: 22 cm Tube secured with: Tape Dental Injury: Teeth and Oropharynx as per pre-operative assessment   Comments: Easy mask airway by paramedic student, Atraumatic intubation by paramedic student

## 2012-02-29 NOTE — Preoperative (Signed)
Beta Blockers   Reason not to administer Beta Blockers:Not Applicable 

## 2012-02-29 NOTE — Transfer of Care (Signed)
Immediate Anesthesia Transfer of Care Note  Patient: James Parsons  Procedure(s) Performed: Procedure(s) (LRB) with comments: TOTAL KNEE ARTHROPLASTY (Right)  Patient Location: PACU  Anesthesia Type:GA combined with regional for post-op pain  Level of Consciousness: awake, alert , oriented and patient cooperative  Airway & Oxygen Therapy: Patient Spontanous Breathing and Patient connected to face mask oxygen  Post-op Assessment: Report given to PACU RN, Post -op Vital signs reviewed and stable and Patient moving all extremities  Post vital signs: Reviewed and stable  Complications: No apparent anesthesia complications

## 2012-02-29 NOTE — Anesthesia Preprocedure Evaluation (Signed)
Anesthesia Evaluation  Patient identified by MRN, date of birth, ID band Patient awake    Reviewed: Allergy & Precautions, H&P , NPO status , Patient's Chart, lab work & pertinent test results  Airway Mallampati: II TM Distance: >3 FB Neck ROM: Full    Dental   Pulmonary COPDformer smoker,  + rhonchi         Cardiovascular hypertension, Rhythm:Regular Rate:Normal     Neuro/Psych Depression  Neuromuscular disease    GI/Hepatic   Endo/Other  diabetes  Renal/GU      Musculoskeletal   Abdominal (+) + obese,   Peds  Hematology   Anesthesia Other Findings   Reproductive/Obstetrics                           Anesthesia Physical Anesthesia Plan  ASA: III  Anesthesia Plan: General   Post-op Pain Management:    Induction: Intravenous  Airway Management Planned: Oral ETT  Additional Equipment:   Intra-op Plan:   Post-operative Plan: Extubation in OR  Informed Consent: I have reviewed the patients History and Physical, chart, labs and discussed the procedure including the risks, benefits and alternatives for the proposed anesthesia with the patient or authorized representative who has indicated his/her understanding and acceptance.     Plan Discussed with: CRNA and Surgeon  Anesthesia Plan Comments:         Anesthesia Quick Evaluation

## 2012-02-29 NOTE — Op Note (Signed)
PATIENT ID:      James Parsons  MRN:     [id number] DOB/AGE:    Jun 03, 1945 / 66 y.o.       OPERATIVE REPORT    DATE OF PROCEDURE:  02/29/2012       PREOPERATIVE DIAGNOSIS:   DEGENERATIVE JOINT DISEASE RIGHT KNEE      Estimated Body mass index is 37.90 kg/(m^2) as calculated from the following:   Height as of 10/18/11: 5\' 7" (1.702 m).   Weight as of 10/18/11: 242 lb(109.77 kg).                                                        POSTOPERATIVE DIAGNOSIS:   DEGENERATIVE JOINT DISEASE RIGHT KNEE                                                                      PROCEDURE:  Procedure(s): TOTAL KNEE ARTHROPLASTY Using Depuy Sigma RP implants #4R Femur, #4Tibia, 10mm sigma RP bearing, 35 Patella     SURGEON: Ron Junco J    ASSISTANT:   Shirl Harris PA-C   (Present and scrubbed throughout the case, critical for assistance with exposure, retraction, instrumentation, and closure.)         ANESTHESIA: GET with Femoral Nerve Block  DRAINS: foley, 2 medium hemovac in knee   TOURNIQUET TIME:   COMPLICATIONS:  None     SPECIMENS: None   INDICATIONS FOR PROCEDURE: The patient has  DEGENERATIVE JOINT DISEASE RIGHT KNEE, varus deformities, XR shows bone on bone arthritis. Patient has failed all conservative measures including anti-inflammatory medicines, narcotics, attempts at  exercise and weight loss, cortisone injections and viscosupplementation.  Risks and benefits of surgery have been discussed, questions answered.   DESCRIPTION OF PROCEDURE: The patient identified by armband, received  right femoral nerve block and IV antibiotics, in the holding area at [facility]. Patient taken to the operating room, appropriate anesthetic  monitors were attached General endotracheal anesthesia induced with  the patient in supine position, Foley catheter was inserted. Tourniquet  applied high to the operative thigh. Lateral post and foot positioner  applied to the table, the lower  extremity was then prepped and draped  in usual sterile fashion from the ankle to the tourniquet. Time-out procedure was performed. The limb was wrapped with an Esmarch bandage and the tourniquet inflated to 350 mmHg. We began the operation by making the anterior midline incision starting at handbreadth above the patella going over the patella 1 cm medial to and  4 cm distal to the tibial tubercle. Small bleeders in the skin and the  subcutaneous tissue identified and cauterized. Transverse retinaculum was incised and reflected medially and a medial parapatellar arthrotomy was accomplished. the patella was everted and theprepatellar fat pad resected. The superficial medial collateral  ligament was then elevated from anterior to posterior along the proximal  flare of the tibia and anterior half of the menisci resected. The knee was hyperflexed exposing bone on bone arthritis. Peripheral and notch osteophytes as well as the cruciate ligaments were then resected. We  continued to  work our way around posteriorly along the proximal tibia, and externally  rotated the tibia subluxing it out from underneath the femur. A McHale  retractor was placed through the notch and a lateral Hohmann retractor  placed, and we then drilled through the proximal tibia in line with the  axis of the tibia followed by an intramedullary guide rod and 2-degree  posterior slope cutting guide. The tibial cutting guide was pinned into place  allowing resection of 5 mm of bone medially and about 10 mm of bone  laterally because of her varus deformity. Satisfied with the tibial resection, we then  entered the distal femur 2 mm anterior to the PCL origin with the  intramedullary guide rod and applied the distal femoral cutting guide  set at 11mm, with 5 degrees of valgus. This was pinned along the  epicondylar axis. At this point, the distal femoral cut was accomplished without difficulty. We then sized for a #4R femoral component  and pinned the guide in 3 degrees of external rotation.The chamfer cutting guide was pinned into place. The anterior, posterior, and chamfer cuts were accomplished without difficulty followed by  the Sigma RP box cutting guide and the box cut. We also removed posterior osteophytes from the posterior femoral condyles. At this  time, the knee was brought into full extension. We checked our  extension and flexion gaps and found them symmetric at 10mm.  The patella thickness measured at 24 mm. We set the cutting guide at 15 and removed the posterior 9.5-10 mm  of the patella sized for 35 button and drilled the lollipop. The knee  was then once again hyperflexed exposing the proximal tibia. We sized for a #4 tibial base plate, applied the smokestack and the conical reamer followed by the the Delta fin keel punch. We then hammered into place the Sigma RP trial femoral component, inserted a 10-mm trial bearing, trial patellar button, and took the knee through range of motion from 0-130 degrees. No thumb pressure was required for patellar  tracking. At this point, all trial components were removed, a double batch of DePuy HV cement with 1500 mg of Zinacef was mixed and applied to all bony metallic mating surfaces except for the posterior condyles of the femur itself. In order, we  hammered into place the tibial tray and removed excess cement, the femoral component and removed excess cement, a 10-mm Sigma RP bearing  was inserted, and the knee brought to full extension with compression.  The patellar button was clamped into place, and excess cement  removed. While the cement cured the wound was irrigated out with normal saline solution pulse lavage, and medium Hemovac drains were placed from an anterolateral  approach. Ligament stability and patellar tracking were checked and found to be excellent. The parapatellar arthrotomy was closed with  running #1 Vicryl suture. The subcutaneous tissue with 0 and 2-0  undyed  Vicryl suture, and the skin with skin staples. A dressing of Xeroform,  4 x 4, dressing sponges, Webril, and Ace wrap applied. The patient  awakened, extubated, and taken to recovery room without difficulty.   Gean Birchwood J 02/29/2012, 2:22 PM

## 2012-02-29 NOTE — Interval H&P Note (Signed)
History and Physical Interval Note:  02/29/2012 12:27 PM  James Parsons  has presented today for surgery, with the diagnosis of DEGENERATIVE JOINT DISEASE RIGHT KNEE  The various methods of treatment have been discussed with the patient and family. After consideration of risks, benefits and other options for treatment, the patient has consented to  Procedure(s) (LRB) with comments: TOTAL KNEE ARTHROPLASTY (Right) as a surgical intervention .  The patient's history has been reviewed, patient examined, no change in status, stable for surgery.  I have reviewed the patient's chart and labs.  Questions were answered to the patient's satisfaction.     Nestor Lewandowsky

## 2012-02-29 NOTE — Progress Notes (Signed)
Orthopedic Tech Progress Note Patient Details:  James Parsons 02/06/1946 [id number]  CPM Left Knee CPM Left Knee: On Left Knee Flexion (Degrees): 60  Left Knee Extension (Degrees): 0  Additional Comments: trapeze bar   Shawnie Pons 02/29/2012, 4:35 PM

## 2012-03-01 ENCOUNTER — Encounter (HOSPITAL_COMMUNITY): Payer: Self-pay | Admitting: General Practice

## 2012-03-01 LAB — BASIC METABOLIC PANEL
CO2: 25 mEq/L (ref 19–32)
Chloride: 95 mEq/L — ABNORMAL LOW (ref 96–112)
Sodium: 133 mEq/L — ABNORMAL LOW (ref 135–145)

## 2012-03-01 LAB — CBC
Platelets: 207 10*3/uL (ref 150–400)
RBC: 3.6 MIL/uL — ABNORMAL LOW (ref 4.22–5.81)
WBC: 11.5 10*3/uL — ABNORMAL HIGH (ref 4.0–10.5)

## 2012-03-01 LAB — GLUCOSE, CAPILLARY: Glucose-Capillary: 269 mg/dL — ABNORMAL HIGH (ref 70–99)

## 2012-03-01 NOTE — Progress Notes (Signed)
Inpatient Diabetes Program Recommendations  AACE/ADA: New Consensus Statement on Inpatient Glycemic Control (2013)  Target Ranges:  Prepandial:   less than 140 mg/dL      Peak postprandial:   less than 180 mg/dL (1-2 hours)      Critically ill patients:  140 - 180 mg/dL   Inpatient Diabetes Program Recommendations HgbA1C: last A1C 10 October 18, 2011 Please order a new A1C to assess pre-hospital glucose control. Thank you  Piedad Climes [facility] Inpatient Diabetes Coordinator [phone]

## 2012-03-01 NOTE — Progress Notes (Signed)
Referral received for SNF. Chart reviewed and CSW has spoken with RNCM who indicates that patient is for DC to home with Home Health and DME.  CSW to sign off. Please re-consult if CSW needs arise.  Campbell Agramonte T. Guerin Lashomb, LCSWA  [phone]  

## 2012-03-01 NOTE — Progress Notes (Signed)
UR COMPLETED  

## 2012-03-01 NOTE — Evaluation (Signed)
Physical Therapy Evaluation Patient Details Name: James Parsons MRN: [id number] DOB: 29-Jul-1945 Today's Date: 03/01/2012 Time: 4098-1191 PT Time Calculation (min): 31 min  PT Assessment / Plan / Recommendation Clinical Impression  Pt is a 66 y/o male admitted s/p right TKA along with the below PT problem list. Pt would benefit from acute PT to maximize independence and facilitate d/c home with HHPT.    PT Assessment  Patient needs continued PT services    Follow Up Recommendations  Home health PT    Does the patient have the potential to tolerate intense rehabilitation      Barriers to Discharge None      Equipment Recommendations  None recommended by PT    Recommendations for Other Services     Frequency 7X/week    Precautions / Restrictions Precautions Precautions: Knee Precaution Booklet Issued: No Restrictions Weight Bearing Restrictions: Yes RLE Weight Bearing: Weight bearing as tolerated   Pertinent Vitals/Pain None      Mobility  Bed Mobility Bed Mobility: Supine to Sit;Sit to Supine Supine to Sit: 4: Min assist;HOB flat Sit to Supine: 5: Supervision Details for Bed Mobility Assistance: Verbal cues for sequence with assist to right LE due to weakness. Transfers Transfers: Sit to Stand;Stand to Sit (3 trials.) Sit to Stand: 4: Min guard;With upper extremity assist;From bed Stand to Sit: 4: Min guard;With upper extremity assist;To bed;To chair/3-in-1 Details for Transfer Assistance: Guarding for balance with cues for safest hand/right LE placement. Ambulation/Gait Ambulation/Gait Assistance: 4: Min guard Ambulation Distance (Feet): 200 Feet Assistive device: Rolling walker Ambulation/Gait Assistance Details: Guarding for balance with cues for tall posture and safe sequence. Gait Pattern: Step-to pattern;Decreased step length - right;Decreased stance time - right;Trunk flexed Stairs: No Wheelchair Mobility Wheelchair Mobility: No    Shoulder  Instructions     Exercises Total Joint Exercises Ankle Circles/Pumps: AROM;Right;10 reps;Supine Quad Sets: AROM;Right;10 reps;Supine Heel Slides: AAROM;Right;10 reps;Supine   PT Diagnosis: Difficulty walking;Generalized weakness  PT Problem List: Decreased strength;Decreased range of motion;Decreased activity tolerance;Decreased balance;Decreased mobility;Decreased knowledge of use of DME;Decreased knowledge of precautions PT Treatment Interventions: DME instruction;Gait training;Stair training;Functional mobility training;Therapeutic activities;Therapeutic exercise;Balance training;Patient/family education   PT Goals Acute Rehab PT Goals PT Goal Formulation: With patient Time For Goal Achievement: 03/08/12 Potential to Achieve Goals: Good Pt will go Supine/Side to Sit: with modified independence PT Goal: Supine/Side to Sit - Progress: Goal set today Pt will go Sit to Supine/Side: with modified independence PT Goal: Sit to Supine/Side - Progress: Goal set today Pt will go Sit to Stand: with modified independence PT Goal: Sit to Stand - Progress: Goal set today Pt will go Stand to Sit: with modified independence PT Goal: Stand to Sit - Progress: Goal set today Pt will Ambulate: >150 feet;with modified independence;with least restrictive assistive device PT Goal: Ambulate - Progress: Goal set today Pt will Go Up / Down Stairs: 1-2 stairs;with modified independence;with least restrictive assistive device PT Goal: Up/Down Stairs - Progress: Goal set today Pt will Perform Home Exercise Program: Independently PT Goal: Perform Home Exercise Program - Progress: Goal set today  Visit Information  Last PT Received On: 03/01/12 Assistance Needed: +1 PT/OT Co-Evaluation/Treatment: Yes (Partial)    Subjective Data  Subjective: "I want to go home today." Patient Stated Goal: Go home.   Prior Functioning  Home Living Lives With: Spouse Available Help at Discharge: Family;Available 24  hours/day Type of Home: House Home Access: Stairs to enter [employer] of Steps: 1 Entrance Stairs-Rails: None Home Layout:  One level Bathroom Shower/Tub: Walk-in shower;Door Bathroom Toilet: Handicapped height Home Adaptive Equipment: Walker - rolling;Bedside commode/3-in-1;Reacher Prior Function Level of Independence: Independent Able to Take Stairs?: Yes Driving: Yes Vocation: Retired Musician: No difficulties Dominant Hand: Right    Cognition  Overall Cognitive Status: Appears within functional limits for tasks assessed/performed Arousal/Alertness: Awake/alert Orientation Level: Appears intact for tasks assessed Behavior During Session: [facility] for tasks performed    Extremity/Trunk Assessment Right Upper Extremity Assessment RUE ROM/Strength/Tone: Within functional levels Left Upper Extremity Assessment LUE ROM/Strength/Tone: Within functional levels Right Lower Extremity Assessment RLE ROM/Strength/Tone: Deficits RLE ROM/Strength/Tone Deficits: 3/5 throughout. AA/ROM right knee 0-50 degrees. RLE Coordination: WFL - gross motor Left Lower Extremity Assessment LLE ROM/Strength/Tone: Within functional levels LLE Coordination: WFL - gross motor Trunk Assessment Trunk Assessment:  (hx of previous back surgeries)   Balance Balance Balance Assessed: No  End of Session PT - End of Session Equipment Utilized During Treatment: Gait belt Activity Tolerance: Patient tolerated treatment well Patient left: in chair;with call bell/phone within reach Nurse Communication: Mobility status CPM Left Knee CPM Left Knee: Off CPM Right Knee CPM Right Knee: Off Right Knee Flexion (Degrees): 60  Right Knee Extension (Degrees): 0  Additional Comments: trapeze  GP     Jamari Diana M 03/01/2012, 10:02 AM  03/01/2012 Cephus Shelling, PT, DPT [phone]

## 2012-03-01 NOTE — Progress Notes (Signed)
Physical Therapy Treatment Patient Details Name: James Parsons MRN: [id number] DOB: September 24, 1945 Today's Date: 03/01/2012 Time: 1200-1230 PT Time Calculation (min): 30 min  PT Assessment / Plan / Recommendation Comments on Treatment Session  Pt admitted s/p right TKA and continues to progress. Pt very motivated and able to continue to increase ambulation distance/independence. Pt also able to negotiate stairs. Ready for safe d/c home once medically cleared by MD.    Follow Up Recommendations  Home health PT     Does the patient have the potential to tolerate intense rehabilitation     Barriers to Discharge        Equipment Recommendations  None recommended by PT    Recommendations for Other Services    Frequency 7X/week   Plan Discharge plan remains appropriate;Frequency remains appropriate    Precautions / Restrictions Precautions Precautions: Knee Precaution Booklet Issued: No Restrictions Weight Bearing Restrictions: Yes RLE Weight Bearing: Weight bearing as tolerated   Pertinent Vitals/Pain None    Mobility  Bed Mobility Bed Mobility: Not assessed Transfers Transfers: Sit to Stand;Stand to Sit Sit to Stand: 5: Supervision;With upper extremity assist;From chair/3-in-1 Stand to Sit: 5: Supervision;With upper extremity assist;To chair/3-in-1 Details for Transfer Assistance: Verbal cues for safest hand/right LE placement. Ambulation/Gait Ambulation/Gait Assistance: 5: Supervision Ambulation Distance (Feet): 400 Feet Assistive device: Rolling walker Ambulation/Gait Assistance Details: Verbal cues for tall posture and attempts at step-through sequence with initial contact on right heel. Gait Pattern: Step-to pattern;Step-through pattern;Decreased step length - right;Decreased stance time - right Stairs: Yes Stairs Assistance: 4: Min guard Stairs Assistance Details (indicate cue type and reason): Guarding for balance with cues for "up with good, down with bad." Stair  Management Technique: Step to pattern;Backwards;With walker Number of Stairs: 1  Wheelchair Mobility Wheelchair Mobility: No    Exercises Total Joint Exercises Ankle Circles/Pumps: AROM;Right;10 reps;Supine Quad Sets: AROM;Right;10 reps;Supine Heel Slides: AAROM;Right;10 reps;Supine Hip ABduction/ADduction: AAROM;Right;10 reps;Supine Straight Leg Raises: AAROM;Right;10 reps;Supine   PT Diagnosis:    PT Problem List:   PT Treatment Interventions:     PT Goals Acute Rehab PT Goals PT Goal Formulation: With patient Time For Goal Achievement: 03/08/12 Potential to Achieve Goals: Good PT Goal: Sit to Stand - Progress: Progressing toward goal PT Goal: Stand to Sit - Progress: Progressing toward goal PT Goal: Ambulate - Progress: Progressing toward goal PT Goal: Up/Down Stairs - Progress: Progressing toward goal PT Goal: Perform Home Exercise Program - Progress: Progressing toward goal  Visit Information  Last PT Received On: 03/01/12 Assistance Needed: +1    Subjective Data  Subjective: "Doing great." Patient Stated Goal: Go home.   Cognition  Overall Cognitive Status: Appears within functional limits for tasks assessed/performed Arousal/Alertness: Awake/alert Orientation Level: Appears intact for tasks assessed Behavior During Session: [facility] for tasks performed    Balance  Balance Balance Assessed: No  End of Session PT - End of Session Equipment Utilized During Treatment: Gait belt Activity Tolerance: Patient tolerated treatment well Patient left: in chair;with call bell/phone within reach Nurse Communication: Mobility status   GP     Cephus Shelling 03/01/2012, 1:03 PM  03/01/2012 Cephus Shelling, PT, DPT [phone]

## 2012-03-01 NOTE — Evaluation (Signed)
Occupational Therapy Evaluation Patient Details Name: James Parsons MRN: [id number] DOB: 03-06-1946 Today's Date: 03/01/2012 Time: 2130-8657 OT Time Calculation (min): 26 min  OT Assessment / Plan / Recommendation Clinical Impression  66 yo male s/p Rt TKA that could benefit from skilled OT acutely. OT to follow and recommend no follow up    OT Assessment  Patient needs continued OT Services    Follow Up Recommendations  No OT follow up    Barriers to Discharge      Equipment Recommendations  None recommended by OT    Recommendations for Other Services    Frequency  Other (comment) (treatement x1 more visit)    Precautions / Restrictions Precautions Precautions: Knee Precaution Booklet Issued: No Restrictions Weight Bearing Restrictions: Yes RLE Weight Bearing: Weight bearing as tolerated   Pertinent Vitals/Pain None reported    ADL  Grooming: Wash/dry hands;Wash/dry face;Teeth care;Supervision/safety Where Assessed - Grooming: Unsupported standing Lower Body Dressing: Minimal assistance (uses reacher at baseline at home) Where Assessed - Lower Body Dressing: Supported sit to stand Toilet Transfer: Hydrographic surveyor Method: Sit to Barista: Raised toilet seat with arms (or 3-in-1 over toilet) Equipment Used: Gait belt;Rolling walker Transfers/Ambulation Related to ADLs: Pt ambulating supervision level. Pt progressing rapidly and eager to d/c.  ADL Comments: pt demonstrates so safety concerns with hand placement with RW. Pt pulling up on RW initially. Pt requires (A) for supine<>sit but can complete sit<>supine for bed mobility. Pt with incr safety with education and good return demo. Ot to keep patient on case load for safety concerns 1 treatment. Pt completed LB dressing and ambulated to sink level for grooming    OT Diagnosis: Acute pain  OT Problem List: Decreased strength;Decreased activity tolerance;Impaired balance (sitting  and/or standing);Decreased safety awareness;Decreased knowledge of use of DME or AE;Decreased knowledge of precautions;Pain OT Treatment Interventions: Self-care/ADL training;DME and/or AE instruction;Therapeutic activities;Patient/family education;Balance training   OT Goals Acute Rehab OT Goals OT Goal Formulation: With patient Time For Goal Achievement: 03/15/12 Potential to Achieve Goals: Good Miscellaneous OT Goals Miscellaneous OT Goal #1: PT will complete bed mobility supine<>sit at supervision level as precursor to adls OT Goal: Miscellaneous Goal #1 - Progress: Goal set today Miscellaneous OT Goal #2: pt will complete basic transfer sit<>standing with MOD I (maintaining all safety wtih RW) as precursor to adls OT Goal: Miscellaneous Goal #2 - Progress: Goal set today  Visit Information  Last OT Received On: 03/01/12 Assistance Needed: +1    Subjective Data  Subjective: "my brother in law bet me I wouldnt be out of the bed today"- pt very eager to participate in therapy and d/c home Patient Stated Goal: to go home today and at latest Friday   Prior Functioning     Home Living Lives With: Spouse Available Help at Discharge: Family;Available 24 hours/day Type of Home: House Home Access: Stairs to enter [employer] of Steps: 1 Entrance Stairs-Rails: None Home Layout: One level Bathroom Shower/Tub: Walk-in shower;Door Bathroom Toilet: Handicapped height Home Adaptive Equipment: Walker - rolling;Bedside commode/3-in-1;Reacher Prior Function Level of Independence: Independent Able to Take Stairs?: Yes Driving: Yes Vocation: Retired Musician: No difficulties Dominant Hand: Right         Vision/Perception     Cognition  Overall Cognitive Status: Appears within functional limits for tasks assessed/performed Arousal/Alertness: Awake/alert Orientation Level: Appears intact for tasks assessed Behavior During Session: [facility] for tasks  performed    Extremity/Trunk Assessment Right Upper Extremity Assessment RUE ROM/Strength/Tone:  Within functional levels Left Upper Extremity Assessment LUE ROM/Strength/Tone: Within functional levels Right Lower Extremity Assessment RLE ROM/Strength/Tone: Deficits RLE ROM/Strength/Tone Deficits: 3/5 throughout. AA/ROM right knee 0-50 degrees. RLE Coordination: WFL - gross motor Left Lower Extremity Assessment LLE ROM/Strength/Tone: Within functional levels LLE Coordination: WFL - gross motor Trunk Assessment Trunk Assessment:  (hx of previous back surgeries)     Mobility Bed Mobility Bed Mobility: Supine to Sit;Sit to Supine Supine to Sit: 4: Min assist;HOB flat Sit to Supine: 5: Supervision Details for Bed Mobility Assistance: Verbal cues for sequence with assist to right LE due to weakness. Transfers Transfers: Sit to Stand;Stand to Sit Sit to Stand: 4: Min guard;With upper extremity assist;From bed Stand to Sit: 4: Min guard;With upper extremity assist;To bed;To chair/3-in-1 Details for Transfer Assistance: min v/c for safety with RW     Shoulder Instructions     Exercise     Balance     End of Session OT - End of Session Activity Tolerance: Patient tolerated treatment well Patient left: Other (comment) (ambulating with PT) Nurse Communication: Mobility status;Precautions CPM Right Knee CPM Right Knee: Off   GO     Harrel Carina [facility] 03/01/2012, 9:36 AM Pager: [phone]

## 2012-03-01 NOTE — Progress Notes (Signed)
Patient ID: James Parsons, male   DOB: 05/04/1945, 66 y.o.   MRN: [id number] PATIENT ID: James Parsons  MRN: [id number]  DOB/AGE:  [birth year] / 66 y.o.  1 Day Post-Op Procedure(s) (LRB): TOTAL KNEE ARTHROPLASTY (Right)    PROGRESS NOTE Subjective: Patient is alert, oriented, no Nausea, no Vomiting, yes passing gas, no Bowel Movement. Taking PO well. Denies SOB, Chest or Calf Pain. Using Incentive Spirometer, PAS in place. Ambulate WBAT, CPM 0-60 Patient reports pain as 2 on 0-10 scale  .    Objective: Vital signs in last 24 hours: Filed Vitals:   02/29/12 2325 02/29/12 2357 03/01/12 0357 03/01/12 0754  BP: 156/95     Pulse: 96     Temp: 99.2 F (37.3 C)     TempSrc:      Resp: 16 15 14 12   SpO2: 98% 94% 95% 97%      Intake/Output from previous day: I/O last 3 completed shifts: In: 2925 [I.V.:2625; IV Piggyback:300] Out: 4075 [Urine:3200; Drains:800; Blood:75]   Intake/Output this shift:     LABORATORY DATA:  Basename 03/01/12 0649 03/01/12 0644 02/29/12 2147 02/29/12 1450  WBC 11.5* -- -- --  HGB 10.8* -- -- --  HCT 31.9* -- -- --  PLT 207 -- -- --  NA -- -- -- --  K -- -- -- --  CL -- -- -- --  CO2 -- -- -- --  BUN -- -- -- --  CREATININE -- -- -- --  GLUCOSE -- -- -- --  GLUCAP -- 269* 261* 171*  INR -- -- -- --  CALCIUM -- -- -- --    Examination: Neurologically intact ABD soft Neurovascular intact Sensation intact distally Intact pulses distally Dorsiflexion/Plantar flexion intact Incision: no drainage No cellulitis present Compartment soft} Blood and plasma separated in drain indicating minimal recent drainage, drain pulled without difficulty.  Assessment:   1 Day Post-Op Procedure(s) (LRB): TOTAL KNEE ARTHROPLASTY (Right) ADDITIONAL DIAGNOSIS:  Diabetes, morbid obesity  Plan: PT/OT WBAT, CPM 5/hrs day until ROM 0-90 degrees, then D/C CPM DVT Prophylaxis:  SCDx72hrs, ASA 325 mg BID x 2 weeks DISCHARGE PLAN: Home, prob tomorrow DISCHARGE  NEEDS: HHPT, HHRN, CPM, Walker and 3-in-1 comode seat     James Parsons 03/01/2012, 8:14 AM

## 2012-03-01 NOTE — Progress Notes (Signed)
CARE MANAGEMENT NOTE 03/01/2012  Patient:  James Parsons, [place]   Account Number:  [id number]  Date Initiated:  03/01/2012  Documentation initiated by:  Vance Peper  Subjective/Objective Assessment:   66 yr old male s/p right total knee arthroplasty.     Action/Plan:   CM spoke with patient concerning home health and DME needs at discharge.   Choice offered. Patient preoperatively setup with [facility], no changes. Has roling walker, 3in1 and CPM have been delivered.   Anticipated DC Date:  03/02/2012   Anticipated DC Plan:  HOME W HOME HEALTH SERVICES      DC Planning Services  CM consult      [facility] Choice  HOME HEALTH   Choice offered to / List presented to:  C-1 Patient        HH arranged  HH-2 PT      [facility] agency  [facility]   Status of service:  Completed, signed off Medicare Important Message given?   (If response is "NO", the following Medicare IM given date fields will be blank) Date Medicare IM given:   Date Additional Medicare IM given:    Discharge Disposition:  HOME W HOME HEALTH SERVICES  Per UR Regulation:    If discussed at Long Length of Stay Meetings, dates discussed:    Comments:

## 2012-03-02 LAB — CBC
HCT: 32.4 % — ABNORMAL LOW (ref 39.0–52.0)
MCHC: 32.4 g/dL (ref 30.0–36.0)
MCV: 88.3 fL (ref 78.0–100.0)
RDW: 14.3 % (ref 11.5–15.5)

## 2012-03-02 MED ORDER — ASPIRIN 325 MG PO TBEC: 325.0000 mg | DELAYED_RELEASE_TABLET | Freq: Two times a day (BID) | ORAL | Status: DC

## 2012-03-02 MED ORDER — OXYCODONE HCL 5 MG PO TABS: 5.0000 mg | ORAL_TABLET | ORAL | Status: DC | PRN

## 2012-03-02 NOTE — Progress Notes (Signed)
Physical Therapy Treatment Patient Details Name: James Parsons MRN: [id number] DOB: 05/19/1945 Today's Date: 03/02/2012 Time: 1478-2956 PT Time Calculation (min): 25 min  PT Assessment / Plan / Recommendation Comments on Treatment Session  Pt admitted s/p right TKA and is motivated to d/c home. Pt able to continue to increase independence this am as well as distance ambulated. Ready for safe d/c home once medically cleared by MD.    Follow Up Recommendations  Home health PT     Does the patient have the potential to tolerate intense rehabilitation     Barriers to Discharge        Equipment Recommendations  None recommended by PT    Recommendations for Other Services    Frequency 7X/week   Plan Discharge plan remains appropriate;Frequency remains appropriate    Precautions / Restrictions Precautions Precautions: Knee Precaution Booklet Issued: No Restrictions Weight Bearing Restrictions: Yes RLE Weight Bearing: Weight bearing as tolerated   Pertinent Vitals/Pain None    Mobility  Bed Mobility Bed Mobility: Not assessed Transfers Transfers: Sit to Stand;Stand to Sit Sit to Stand: 6: Modified independent (Device/Increase time) Stand to Sit: 6: Modified independent (Device/Increase time) Ambulation/Gait Ambulation/Gait Assistance: 5: Supervision Ambulation Distance (Feet): 500 Feet Assistive device: Rolling walker Ambulation/Gait Assistance Details: Cues for tall extended posture and step-through sequence. Gait Pattern: Step-through pattern;Decreased step length - right;Decreased stance time - right;Trunk flexed Stairs: Yes Stairs Assistance: 5: Supervision Stairs Assistance Details (indicate cue type and reason): Verbal cues for correct sequence of "up with good, down with bad." Stair Management Technique: Step to pattern;Backwards;With walker Number of Stairs: 1  Wheelchair Mobility Wheelchair Mobility: No    Exercises Total Joint Exercises Ankle  Circles/Pumps: AROM;Right;10 reps;Supine Quad Sets: AROM;Right;10 reps;Supine Heel Slides: AAROM;Right;10 reps;Supine Hip ABduction/ADduction: AROM;Right;10 reps;Supine Straight Leg Raises: AROM;Right;10 reps;Supine Goniometric ROM: AA/ROM right knee 0-65 degrees/   PT Diagnosis:    PT Problem List:   PT Treatment Interventions:     PT Goals Acute Rehab PT Goals PT Goal Formulation: With patient Time For Goal Achievement: 03/08/12 Potential to Achieve Goals: Good PT Goal: Sit to Stand - Progress: Met PT Goal: Stand to Sit - Progress: Met PT Goal: Ambulate - Progress: Progressing toward goal PT Goal: Up/Down Stairs - Progress: Progressing toward goal PT Goal: Perform Home Exercise Program - Progress: Progressing toward goal  Visit Information  Last PT Received On: 03/02/12 Assistance Needed: +1    Subjective Data  Subjective: "Ready to go." Patient Stated Goal: Go home.   Cognition  Overall Cognitive Status: Appears within functional limits for tasks assessed/performed Arousal/Alertness: Awake/alert Orientation Level: Appears intact for tasks assessed Behavior During Session: [facility] for tasks performed    Balance  Balance Balance Assessed: No  End of Session PT - End of Session Equipment Utilized During Treatment: Gait belt Activity Tolerance: Patient tolerated treatment well Patient left: in chair;with call bell/phone within reach Nurse Communication: Mobility status   GP     Cephus Shelling 03/02/2012, 8:21 AM  03/02/2012 Cephus Shelling, PT, DPT [phone]

## 2012-03-02 NOTE — Discharge Summary (Signed)
Patient ID: James Parsons MRN: [id number] DOB/AGE: 66-Jun-1947 66 y.o.  Admit date: 02/29/2012 Discharge date: 03/02/2012  Admission Diagnoses:  Principal Problem:  *Osteoarthritis of right knee   Discharge Diagnoses:  Same  Past Medical History  Diagnosis Date  . Anemia   . Arthritis   . Blood transfusion   . Cancer   . Clotting disorder   . Diabetes mellitus   . COPD (chronic obstructive pulmonary disease)   . CHF (congestive heart failure)   . Borderline hypertension   . Venous insufficiency   . Hyperlipidemia   . Obesity   . Diverticulosis of colon   . Hx of colonic polyps   . Low back pain   . Lipoma   . RSD (reflex sympathetic dystrophy)   . Pneumonia   . Mental disorder   . Depression     hx of     Surgeries: Procedure(s): TOTAL KNEE ARTHROPLASTY on 02/29/2012   Consultants:    Discharged Condition: Improved  Hospital Course: James Parsons is an 66 y.o. male who was admitted 02/29/2012 for operative treatment ofOsteoarthritis of right knee. Patient has severe unremitting pain that affects sleep, daily activities, and work/hobbies. After pre-op clearance the patient was taken to the operating room on 02/29/2012 and underwent  Procedure(s): TOTAL KNEE ARTHROPLASTY.    Patient was given perioperative antibiotics: Anti-infectives     Start     Dose/Rate Route Frequency Ordered Stop   02/29/12 1327   cefUROXime (ZINACEF) injection  Status:  Discontinued          As needed 02/29/12 1327 02/29/12 1441   02/28/12 1419   ceFAZolin (ANCEF) IVPB 2 g/50 mL premix        2 g 100 mL/hr over 30 Minutes Intravenous 60 min pre-op 02/28/12 1419 02/29/12 1304           Patient was given sequential compression devices, early ambulation, and chemoprophylaxis to prevent DVT.  Patient benefited maximally from hospital stay and there were no complications.    Recent vital signs: Patient Vitals for the past 24 hrs:  BP Temp Pulse Resp SpO2  03/02/12 0656  152/75 mmHg 97.4 F (36.3 C) 82  18  98 %  03/02/12 0400 - - - 18  96 %  03/02/12 0036 - - - 18  96 %  March 27, 2012 2235 145/82 mmHg 98.7 F (37.1 C) 84  18  96 %  03/27/2012 2000 - - - 16  95 %  Mar 27, 2012 1600 - - - 15  95 %  27-Mar-2012 1400 142/78 mmHg 97.9 F (36.6 C) 89  18  97 %  03/27/2012 1200 - - - 15  97 %  03/27/12 0754 - - - 12  97 %     Recent laboratory studies:  Basename 03/02/12 0640 03/27/12 0649  WBC 16.3* 11.5*  HGB 10.5* 10.8*  HCT 32.4* 31.9*  PLT 259 207  NA -- 133*  K -- 4.7  CL -- 95*  CO2 -- 25  BUN -- 10  CREATININE -- 0.58  GLUCOSE -- 259*  INR -- --  CALCIUM -- 8.9     Discharge Medications:     Medication List     As of 03/02/2012  7:24 AM    STOP taking these medications         aspirin 81 MG tablet      TAKE these medications         aspirin 325 MG EC tablet   Take  1 tablet (325 mg total) by mouth 2 (two) times daily.      celecoxib 200 MG capsule   Commonly known as: CELEBREX   Take 200 mg by mouth 2 (two) times daily.      cloNIDine 0.1 MG tablet   Commonly known as: CATAPRES   Take 1 tablet (0.1 mg total) by mouth 2 (two) times daily.      cyclobenzaprine 10 MG tablet   Commonly known as: FLEXERIL   Take 10 mg by mouth 2 (two) times daily as needed. Muscle spasm.      CYMBALTA 60 MG capsule   Generic drug: DULoxetine   Take 60 mg by mouth daily.      ENDOCET 10-325 MG per tablet   Generic drug: oxyCODONE-acetaminophen   Take 1 tablet by mouth 3 (three) times daily as needed. pain      fentaNYL 0.05 MG/ML SOLN 5,000 mcg   Inject into the skin continuous. Patient will bring dosage instructions with him. Daily dose 0.84mg       glimepiride 4 MG tablet   Commonly known as: AMARYL   Take 1 tablet (4 mg total) by mouth daily before breakfast.      lidocaine 5 %   Commonly known as: LIDODERM   Place 1 patch onto the skin daily as needed. Pain. Remove & Discard patch within 12 hours      LYRICA 150 MG capsule   Generic drug:  pregabalin   Take 150 mg by mouth 2 (two) times daily.      metFORMIN 500 MG tablet   Commonly known as: GLUCOPHAGE   Take 2 tablets by mouth two times daily      oxyCODONE 5 MG immediate release tablet   Commonly known as: Oxy IR/ROXICODONE   Take 1-2 tablets (5-10 mg total) by mouth every 3 (three) hours as needed for pain (breakthrough pain post-op).      simvastatin 40 MG tablet   Commonly known as: ZOCOR   Take 1 tablet (40 mg total) by mouth at bedtime.      sitaGLIPtin 100 MG tablet   Commonly known as: JANUVIA   Take 1 tablet (100 mg total) by mouth daily.      traMADol 50 MG tablet   Commonly known as: ULTRAM   Take 50 mg by mouth 3 (three) times daily as needed. pain        Diagnostic Studies: Dg Chest 2 View  02/21/2012  *RADIOLOGY REPORT*  Clinical Data: Preop for total knee replacement  CHEST - 2 VIEW  Comparison: Chest x-ray of 04/22/2011  Findings: No active infiltrate or effusion is seen.  Mediastinal contours appear stable.  There is mild cardiomegaly present which is stable.  Neurostimulator electrodes overlie the lower thoracic spinal canal.  There is a slight thoracic scoliosis present with degenerative change.  IMPRESSION: Stable chest x-ray with mild cardiomegaly.  No active lung disease.   Original Report Authenticated By: Dwyane Dee, M.D.     Disposition:       Discharge Orders    Future Appointments: Provider: Department: Dept Phone: Center:   04/24/2012 2:00 PM Michele Mcalpine, MD [facility] [phone] None     Future Orders Please Complete By Expires   Increase activity slowly      [facility]       May shower / Bathe      Driving Restrictions      Comments:   No driving for 2 weeks.  Change dressing (specify)      Comments:   Dressing change as needed.   Call MD for:  temperature >100.4      Call MD for:  severe uncontrolled pain      Call MD for:  redness, tenderness, or signs of infection (pain, swelling, redness, odor or  green/yellow discharge around incision site)      Discharge instructions      Comments:   F/U with Dr. Turner Daniels as scheduled.         SignedHazle Nordmann. 03/02/2012, 7:24 AM

## 2012-03-02 NOTE — Progress Notes (Signed)
PATIENT ID: James Parsons  MRN: [id number]  DOB/AGE:  Feb 02, 1946 / 66 y.o.  2 Days Post-Op Procedure(s) (LRB): TOTAL KNEE ARTHROPLASTY (Right)    PROGRESS NOTE Subjective: Patient is alert, oriented, no Nausea, no Vomiting, yes passing gas, no Bowel Movement. Taking PO well. Denies SOB, Chest or Calf Pain. Using Incentive Spirometer, PAS in place. Ambulating well with PT. Patient reports pain as mild  .    Objective: Vital signs in last 24 hours: Filed Vitals:   03/01/12 2235 03/02/12 0036 03/02/12 0400 03/02/12 0656  BP: 145/82   152/75  Pulse: 84   82  Temp: 98.7 F (37.1 C)   97.4 F (36.3 C)  TempSrc:      Resp: 18 18 18 18   SpO2: 96% 96% 96% 98%      Intake/Output from previous day: I/O last 3 completed shifts: In: 4929.6 [P.O.:390; I.V.:4239.6; IV Piggyback:300] Out: 4350 [Urine:3650; Drains:700]   Intake/Output this shift:     LABORATORY DATA:  Basename 03/02/12 0640 03/01/12 2215 03/01/12 1657 03/01/12 1133 03/01/12 0649  WBC 16.3* -- -- -- 11.5*  HGB 10.5* -- -- -- 10.8*  HCT 32.4* -- -- -- 31.9*  PLT 259 -- -- -- 207  NA -- -- -- -- 133*  K -- -- -- -- 4.7  CL -- -- -- -- 95*  CO2 -- -- -- -- 25  BUN -- -- -- -- 10  CREATININE -- -- -- -- 0.58  GLUCOSE -- -- -- -- 259*  GLUCAP -- 140* 239* 153* --  INR -- -- -- -- --  CALCIUM -- -- -- -- 8.9    Examination: Neurologically intact ABD soft Neurovascular intact Sensation intact distally Intact pulses distally Dorsiflexion/Plantar flexion intact Incision: dressing C/D/I}  Assessment:   2 Days Post-Op Procedure(s) (LRB): TOTAL KNEE ARTHROPLASTY (Right) ADDITIONAL DIAGNOSIS:  none  Plan: PT/OT WBAT, CPM 5/hrs day until ROM 0-90 degrees, then D/C CPM DVT Prophylaxis:  SCDx72hrs, ASA 325 mg BID x 2 weeks DISCHARGE PLAN: Home today DISCHARGE NEEDS: HHPT, HHRN, CPM, Walker and 3-in-1 comode seat     Paulette Rockford M. 03/02/2012, 7:19 AM

## 2012-04-11 ENCOUNTER — Encounter (HOSPITAL_COMMUNITY): Payer: Self-pay

## 2012-04-11 ENCOUNTER — Telehealth: Payer: Self-pay | Admitting: Internal Medicine

## 2012-04-11 ENCOUNTER — Emergency Department (HOSPITAL_COMMUNITY)
Admission: EM | Admit: 2012-04-11 | Discharge: 2012-04-11 | Disposition: A | Discharge status: Home / Self Care | Payer: Medicare PPO | Attending: Emergency Medicine | Admitting: Emergency Medicine

## 2012-04-11 DIAGNOSIS — Z9889 Other specified postprocedural states: Secondary | ICD-10-CM | POA: Insufficient documentation

## 2012-04-11 DIAGNOSIS — I509 Heart failure, unspecified: Secondary | ICD-10-CM | POA: Insufficient documentation

## 2012-04-11 DIAGNOSIS — Z79899 Other long term (current) drug therapy: Secondary | ICD-10-CM | POA: Insufficient documentation

## 2012-04-11 DIAGNOSIS — Z7982 Long term (current) use of aspirin: Secondary | ICD-10-CM | POA: Insufficient documentation

## 2012-04-11 DIAGNOSIS — E119 Type 2 diabetes mellitus without complications: Secondary | ICD-10-CM | POA: Insufficient documentation

## 2012-04-11 DIAGNOSIS — Z8739 Personal history of other diseases of the musculoskeletal system and connective tissue: Secondary | ICD-10-CM | POA: Insufficient documentation

## 2012-04-11 DIAGNOSIS — Z860100 Personal history of colon polyps, unspecified: Secondary | ICD-10-CM | POA: Insufficient documentation

## 2012-04-11 DIAGNOSIS — Z794 Long term (current) use of insulin: Secondary | ICD-10-CM | POA: Insufficient documentation

## 2012-04-11 DIAGNOSIS — R319 Hematuria, unspecified: Secondary | ICD-10-CM | POA: Insufficient documentation

## 2012-04-11 DIAGNOSIS — J4489 Other specified chronic obstructive pulmonary disease: Secondary | ICD-10-CM | POA: Insufficient documentation

## 2012-04-11 DIAGNOSIS — Z8619 Personal history of other infectious and parasitic diseases: Secondary | ICD-10-CM | POA: Insufficient documentation

## 2012-04-11 DIAGNOSIS — Z85828 Personal history of other malignant neoplasm of skin: Secondary | ICD-10-CM | POA: Insufficient documentation

## 2012-04-11 DIAGNOSIS — Z862 Personal history of diseases of the blood and blood-forming organs and certain disorders involving the immune mechanism: Secondary | ICD-10-CM | POA: Insufficient documentation

## 2012-04-11 DIAGNOSIS — E669 Obesity, unspecified: Secondary | ICD-10-CM | POA: Insufficient documentation

## 2012-04-11 DIAGNOSIS — R109 Unspecified abdominal pain: Secondary | ICD-10-CM | POA: Insufficient documentation

## 2012-04-11 DIAGNOSIS — R300 Dysuria: Secondary | ICD-10-CM | POA: Insufficient documentation

## 2012-04-11 DIAGNOSIS — E785 Hyperlipidemia, unspecified: Secondary | ICD-10-CM | POA: Insufficient documentation

## 2012-04-11 DIAGNOSIS — Z87891 Personal history of nicotine dependence: Secondary | ICD-10-CM | POA: Insufficient documentation

## 2012-04-11 DIAGNOSIS — J449 Chronic obstructive pulmonary disease, unspecified: Secondary | ICD-10-CM | POA: Insufficient documentation

## 2012-04-11 DIAGNOSIS — Z8659 Personal history of other mental and behavioral disorders: Secondary | ICD-10-CM | POA: Insufficient documentation

## 2012-04-11 LAB — CBC WITH DIFFERENTIAL/PLATELET
Eosinophils Relative: 2 % (ref 0–5)
HCT: 35.2 % — ABNORMAL LOW (ref 39.0–52.0)
Lymphocytes Relative: 20 % (ref 12–46)
Lymphs Abs: 1.7 10*3/uL (ref 0.7–4.0)
MCV: 85.9 fL (ref 78.0–100.0)
Monocytes Absolute: 0.4 10*3/uL (ref 0.1–1.0)
Monocytes Relative: 5 % (ref 3–12)
RBC: 4.1 MIL/uL — ABNORMAL LOW (ref 4.22–5.81)
WBC: 8.8 10*3/uL (ref 4.0–10.5)

## 2012-04-11 LAB — URINALYSIS, ROUTINE W REFLEX MICROSCOPIC
Nitrite: NEGATIVE
Protein, ur: 30 mg/dL — AB
Specific Gravity, Urine: 1.013 (ref 1.005–1.030)
Urobilinogen, UA: 0.2 mg/dL (ref 0.0–1.0)

## 2012-04-11 LAB — COMPREHENSIVE METABOLIC PANEL
CO2: 25 mEq/L (ref 19–32)
Calcium: 9.5 mg/dL (ref 8.4–10.5)
Creatinine, Ser: 0.63 mg/dL (ref 0.50–1.35)
GFR calc Af Amer: >90 mL/min (ref >90)
GFR calc non Af Amer: >90 mL/min (ref >90)
Glucose, Bld: 247 mg/dL — ABNORMAL HIGH (ref 70–99)

## 2012-04-11 LAB — PROTIME-INR
INR: 1.06 (ref 0.00–1.49)
Prothrombin Time: 13.7 seconds (ref 11.6–15.2)

## 2012-04-11 MED ORDER — FENTANYL CITRATE 0.05 MG/ML IJ SOLN
50.0000 ug | Freq: Once | INTRAMUSCULAR | Status: AC
  Administered 2012-04-11: 100 ug via INTRAVENOUS
  Filled 2012-04-11: qty 2

## 2012-04-11 MED ORDER — SODIUM CHLORIDE 0.9 % IV BOLUS (SEPSIS)
1000.0000 mL | Freq: Once | INTRAVENOUS | Status: AC
  Administered 2012-04-11: 1000 mL via INTRAVENOUS

## 2012-04-11 MED ORDER — ONDANSETRON HCL 4 MG/2ML IJ SOLN
4.0000 mg | Freq: Once | INTRAMUSCULAR | Status: AC
  Administered 2012-04-11: 4 mg via INTRAVENOUS
  Filled 2012-04-11: qty 2

## 2012-04-11 MED ORDER — CIPROFLOXACIN HCL 250 MG PO TABS: 500.0000 mg | ORAL_TABLET | Freq: Two times a day (BID) | ORAL | Status: DC

## 2012-04-11 MED ORDER — CIPROFLOXACIN HCL 250 MG PO TABS: 250.0000 mg | ORAL_TABLET | Freq: Two times a day (BID) | ORAL | Status: DC

## 2012-04-11 NOTE — ED Notes (Signed)
Rn to obtain labs with IV start 

## 2012-04-11 NOTE — ED Notes (Signed)
Pt noticed blood in his urine this am, called [facility] and received a prescription for antibiotics, bleeding became worse, bright red and passing clots

## 2012-04-11 NOTE — ED Provider Notes (Signed)
History     CSN: [id number]  Arrival date & time 04/11/12  1508   First MD Initiated Contact with Patient 04/11/12 1515      Chief Complaint  Patient presents with  . Hematuria    (Consider location/radiation/quality/duration/timing/severity/associated sxs/prior treatment) HPI The patient p/w dysuria, hematuria and lower abdominal pain. Sx began today, after awakening.  He was in his [facility] last night. Since onset Sx have been persistent, w copious urine / blood produced w associated dysuria.  There is minimal, but constant lower abdominal diffuse discomfort when the patient is not urinating. No relief w anything, Sx worse w urination. No f/c, n/v/d, cp/dyspnea. Patient is taking 650mg  ASA daily following a R TKR ~6wk pta.  The patient spoke with one of his PMD's colleagues this AM, and was started on Cipro (250 qd), he has taken a single dose.   Past Medical History  Diagnosis Date  . Anemia   . Arthritis   . Blood transfusion   . Cancer   . Clotting disorder   . Diabetes mellitus   . COPD (chronic obstructive pulmonary disease)   . CHF (congestive heart failure)   . Borderline hypertension   . Venous insufficiency   . Hyperlipidemia   . Obesity   . Diverticulosis of colon   . Hx of colonic polyps   . Low back pain   . Lipoma   . RSD (reflex sympathetic dystrophy)   . Pneumonia   . Mental disorder   . Depression     hx of     Past Surgical History  Procedure Date  . Back surgery [id number]  . Morphine pump 2009    due to reaction to morphine  . Excision of lipoma from right olecranon area 11/2000    Dr. Zachery Dakins  . Microdiscectomy and decompression 05/2002    Dr. Shelle Iron  . C3-4 anterior cervical discectomy and fusion with plating at c3-4 05/2006    Dr. Lovell Sheehan  . Spinal cord stimulator implanted     for pain per Dr. Haskel Khan  . Subcut pain pump implanted   . Pain pump implantation     with fentanyl  . Tonsillectomy   . Colonoscopy w/ polypectomy   . Knee  arthroscopy     right knee  . 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 Total knee arthroplasty 02/29/2012    Procedure: TOTAL KNEE ARTHROPLASTY;  Surgeon: Nestor Lewandowsky, MD;  Location: [facility] OR;  Service: Orthopedics;  Laterality: Right;    Family History  Problem Relation Age of Onset  . Cancer Father     History  Substance Use Topics  . Smoking status: Former Smoker -- 3.0 packs/day    Types: Cigarettes    Quit date: 05/25/2002  . Smokeless tobacco: Never Used  . Alcohol Use: No      Review of Systems  Constitutional:       Per HPI, otherwise negative  HENT:       Per HPI, otherwise negative  Eyes: Negative.   Respiratory:       Per HPI, otherwise negative  Cardiovascular:       Per HPI, otherwise negative  Gastrointestinal: Negative for vomiting.  Genitourinary:       HPI  Musculoskeletal:       Per HPI, otherwise negative  Skin: Negative.   Neurological: Negative for syncope.    Allergies  Codeine phosphate and Morphine and related  Home Medications   Current Outpatient Rx  Name  Route  Sig  Dispense  Refill  . ASPIRIN 325 MG PO TBEC   Oral   Take 1 tablet (325 mg total) by mouth 2 (two) times daily.   30 tablet   0   . CELECOXIB 200 MG PO CAPS   Oral   Take 200 mg by mouth 2 (two) times daily.           [name] CIPROFLOXACIN HCL 250 MG PO TABS   Oral   Take 250 mg by mouth 2 (two) times daily. Pt started on 04-11-12 for a total therapy for 3 days.         [name] CLONIDINE HCL 0.1 MG PO TABS   Oral   Take 1 tablet (0.1 mg total) by mouth 2 (two) times daily.   180 tablet   3   . CYCLOBENZAPRINE HCL 10 MG PO TABS   Oral   Take 10 mg by mouth 2 (two) times daily as needed. Muscle spasm.         . CYMBALTA 60 MG PO CPEP   Oral   Take 60 mg by mouth daily.          . ENDOCET 10-325 MG PO TABS   Oral   Take 1 tablet by mouth 3 (three) times daily as needed. pain         . GLIMEPIRIDE 4 MG PO TABS   Oral   Take 1 tablet (4 mg  total) by mouth daily before breakfast.   90 tablet   3   . LIDOCAINE 5 % EX PTCH   Transdermal   Place 1 patch onto the skin daily as needed. Pain. Remove & Discard patch within 12 hours         . LYRICA 150 MG PO CAPS   Oral   Take 150 mg by mouth 2 (two) times daily.          [name] METFORMIN HCL 500 MG PO TABS      Take 2 tablets by mouth two times daily   360 tablet   3     Pt needs appt for further refills   . OXYCODONE HCL 5 MG PO TABS   Oral   Take 1-2 tablets (5-10 mg total) by mouth every 3 (three) hours as needed for pain (breakthrough pain post-op).   60 tablet   0   . SIMVASTATIN 40 MG PO TABS   Oral   Take 1 tablet (40 mg total) by mouth at bedtime.   90 tablet   3   . SITAGLIPTIN PHOSPHATE 100 MG PO TABS   Oral   Take 1 tablet (100 mg total) by mouth daily.   90 tablet   3   . TRAMADOL HCL 50 MG PO TABS   Oral   Take 50 mg by mouth 3 (three) times daily as needed. pain         . FENTANYL 50 MCG/ML [place] INFUSION   Subcutaneous   Inject into the skin continuous. Patient will bring dosage instructions with him. Daily dose 0.84mg            BP 145/75  Pulse 105  Temp 98.7 F (37.1 C) (Oral)  SpO2 98%  Physical Exam  Nursing note and vitals reviewed. Constitutional: He is oriented to person, place, and time. He appears well-developed. No distress.       Obese, elderly appearing M  HENT:  Head: Normocephalic and atraumatic.  Eyes: Conjunctivae normal and EOM  are normal.  Cardiovascular: Normal rate and regular rhythm.   Pulmonary/Chest: Effort normal. No stridor. No respiratory distress.  Abdominal: He exhibits no distension.       Mild ttp about the lower abd w minimal guarding, no rebound. BS WNL    Genitourinary: Testes normal. Uncircumcised. No phimosis, paraphimosis, penile erythema or penile tenderness. No discharge found.       Dried blood near meatus, w no active bleeding.  Musculoskeletal: He exhibits no edema.  Lymphadenopathy:        Right: No inguinal adenopathy present.       Left: No inguinal adenopathy present.  Neurological: He is alert and oriented to person, place, and time.  Skin: Skin is warm and dry.  Psychiatric: He has a normal mood and affect.    ED Course  Procedures (including critical care time)   Labs Reviewed  CBC WITH DIFFERENTIAL  COMPREHENSIVE METABOLIC PANEL  URINALYSIS, ROUTINE W REFLEX MICROSCOPIC  PROTIME-INR   No results found.   No diagnosis found.  O2- 99%ra, normal  5:38 PM Patient in no pain.  Vital signs remained stable.  MDM  This elderly male now presents with concerns of new hematuria.  On exam he is in no distress, with unremarkable vital signs.  The patient's hemoglobin is stable, platelets stable.  Other is some consideration of infection, versus likely cystitis, with some consideration of his aspirin dosing as contributory.  Absent fever, distress, other abnormal vital signs, the patient was appropriate for outpatient management.  I discussed possible etiologies, the need for return precautions, as well as followup instructions with the patient and a companion.  He was discharged in stable condition.  Gerhard Munch, MD 04/11/12 1739

## 2012-04-11 NOTE — ED Notes (Signed)
RUE:[id number]<JW> Expected date:04/11/12<BR> Expected time: 3:02 PM<BR> Means of arrival:<BR> Comments:<BR> Hematuria,

## 2012-04-11 NOTE — Telephone Encounter (Signed)
On call- Wife reports husband months s/p TKR, on ASA. New onset dysuria w/ blood per urethra. I called Cipro 250 bid x 3 days to [facility]. If persistent> ER for urology.

## 2012-04-12 ENCOUNTER — Telehealth: Payer: Self-pay | Admitting: Pulmonary Disease

## 2012-04-12 ENCOUNTER — Other Ambulatory Visit: Payer: Self-pay | Admitting: Pulmonary Disease

## 2012-04-12 DIAGNOSIS — R319 Hematuria, unspecified: Secondary | ICD-10-CM

## 2012-04-12 NOTE — Telephone Encounter (Signed)
Per SN---ok to send in referal to urology ASAP for hematuria.  thanks

## 2012-04-12 NOTE — Telephone Encounter (Signed)
Pt's wife returned triage's call.  Advised that an order for an ASAP urology appt was place.  Pt's wife verbalized understanding & will await a phone call to get this set.  Antionette Fairy

## 2012-04-12 NOTE — Telephone Encounter (Signed)
Spoke with pt's spouse  She states that pt had severe hematuria yesterday Called here and got abx called in per [facility] Severity of bleeding was worse so went to ED- was told had UTI and given more abx ED doc suggested referral to urologist She states still having some bleeding this am and pt is very uncomfortable Please advise if okay to send STAT referral thanks! Last ov with SN 04/22/11 Next ov 04/24/12

## 2012-04-12 NOTE — Telephone Encounter (Signed)
Pt wife called back & states that pt bleeds from his penis every time he urinates.  She states this is a very large amount of blood that lasts 5-6 minutes each time pt urinates.  Pt was seen in ED yesterday & advised to see a urologist.  Requests to get a referral for a  urologist ASAP.  Antionette Fairy

## 2012-04-12 NOTE — Telephone Encounter (Signed)
Called, spoke with pt's wife.  States she hadn't heard anything so she called [facility].  States pt has an appt schedule with Dr. Celso Amy for tomorrow at 9:45 am.  They are ok with keeping this appt unless our office can get something scheduled for today.  PCCs, pls advise.  Thank you.  Clydie Braun would like to be called back at [phone].

## 2012-04-12 NOTE — Telephone Encounter (Signed)
appt 04/13/12@9 :45am is the only appt available pt aware Tobe Sos

## 2012-04-12 NOTE — Telephone Encounter (Signed)
Order placed -- [id number] to inform pt's wife.

## 2012-04-24 ENCOUNTER — Encounter: Payer: Self-pay | Admitting: Pulmonary Disease

## 2012-04-24 ENCOUNTER — Ambulatory Visit (INDEPENDENT_AMBULATORY_CARE_PROVIDER_SITE_OTHER): Payer: Medicare PPO | Admitting: Pulmonary Disease

## 2012-04-24 VITALS — BP 144/80 | HR 100 | Temp 98.0°F | Ht 67.0 in | Wt 255.8 lb

## 2012-04-24 DIAGNOSIS — I10 Essential (primary) hypertension: Secondary | ICD-10-CM

## 2012-04-24 DIAGNOSIS — D126 Benign neoplasm of colon, unspecified: Secondary | ICD-10-CM

## 2012-04-24 DIAGNOSIS — K5730 Diverticulosis of large intestine without perforation or abscess without bleeding: Secondary | ICD-10-CM

## 2012-04-24 DIAGNOSIS — I872 Venous insufficiency (chronic) (peripheral): Secondary | ICD-10-CM

## 2012-04-24 DIAGNOSIS — K5900 Constipation, unspecified: Secondary | ICD-10-CM

## 2012-04-24 DIAGNOSIS — J449 Chronic obstructive pulmonary disease, unspecified: Secondary | ICD-10-CM

## 2012-04-24 DIAGNOSIS — M545 Low back pain, unspecified: Secondary | ICD-10-CM

## 2012-04-24 DIAGNOSIS — M1711 Unilateral primary osteoarthritis, right knee: Secondary | ICD-10-CM

## 2012-04-24 DIAGNOSIS — E119 Type 2 diabetes mellitus without complications: Secondary | ICD-10-CM

## 2012-04-24 DIAGNOSIS — E669 Obesity, unspecified: Secondary | ICD-10-CM

## 2012-04-24 DIAGNOSIS — M1710 Unilateral primary osteoarthritis, unspecified knee: Secondary | ICD-10-CM

## 2012-04-24 DIAGNOSIS — IMO0002 Reserved for concepts with insufficient information to code with codable children: Secondary | ICD-10-CM

## 2012-04-24 DIAGNOSIS — E785 Hyperlipidemia, unspecified: Secondary | ICD-10-CM

## 2012-04-24 DIAGNOSIS — M1990 Unspecified osteoarthritis, unspecified site: Secondary | ICD-10-CM

## 2012-04-24 DIAGNOSIS — G9050 Complex regional pain syndrome I, unspecified: Secondary | ICD-10-CM

## 2012-04-24 DIAGNOSIS — R319 Hematuria, unspecified: Secondary | ICD-10-CM

## 2012-04-24 DIAGNOSIS — J4489 Other specified chronic obstructive pulmonary disease: Secondary | ICD-10-CM

## 2012-04-24 NOTE — Progress Notes (Signed)
Subjective:     Patient ID: James Parsons, male   DOB: 25-Sep-1945, 67 y.o.   MRN: [id number]  HPI 67 y/o WM here for a follow up visit... he has multiple medical problems including COPD (former heavy smoker);  HBP;  Hyperlipidemia & DM;  Obesity;  Divertics & Polyps;  LBP, RSD, & chr pain syndrome...  ~  March 08, 2010: his CC remains his chr pain for which he sees DrRauch pain clinic every month w/ 8 meds and pain pump in spine & TENS implanted as well... he states that his breathing is OK;  notes his BP is only elev when he is in pain & denies CP/ angina etc;  BS & A1c are way out of control & we are adding Glimep + max Metformin;  we reviewed diet + exercise profram & he must lose weight to keep himself from having to start on insulin... OK Flu vaccine today.  ~  April 22, 2011:  57mo ROV & his complaints revolve around his chronic pain & his pain meds which are managed by his Pain Clinic- DrRauch/ [place] who "burned the nerves in my back" & continue to treat him w/ SQ pain pump w/ Fentenyl, spinal cord stimulator, Oxycodone, Cymbalta, Lyrica, Celebrex, Flexeril, Ultram, Lidoderm; we do not have notes from the pain clinic ...  We reserved our follow up eval & discussion to his medical problems which he continues to ignore since his chronic pain issues are all that he cares about... James Parsons did not bring med bottles or list today> identifies meds as "red pill" "green pill" etc (but we did call his Pharm- [facility] on [address] for info on med compliance)...    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    >BP controlled on low sodium diet + Clonidine 0.1mg Bid started  by [facility] for his pain syndrome; he notes BP elev when his pain is worse; BP today= 132/88 & he notes similar at home...    >Chol is controlled by Simva40 (plus diet) and f/u FLP showed LDL=119 likely related to medication compliance issues...    >When last seen 11/11 his BS=385 & A1c=10.6> Metformin was incr to 1000Bid & Glimep 2mg Qam added; pt never returned as requested & Pharm confirms that he hasn't been taking either med regularly; weight is unchanged at 264# & he has not been on diet & can't exercise due to his back; labs today showed BS=137, A1c=7.7 and he is asked to take the current meds regularly & return so we can assess their true efficacy before recommending any adjustments; we reviewed diet as well...    He saw DrWeatherly [facility] for recurrent lipoma right upper arm but he didn't think it was contributing to his pain therefore didn't rec surg...    He requests Ortho referral for someone to check his knees...    He requests Flu shot today & in need of Pneumovax as well...  ~  October 18, 2011:  90mo ROV & James Parsons remains in pain all the time every day; still sees DrRauch every month w/ spinal cord stimulator & pain pump under the skin, plus Oxycodone, Flexeril, Celebrex, Tramadol, Lyrica, Cymbalta...  Somehow  he has lost 22# down to 242# today "on diet" he says; unfortunately his DM control is no better w/ A1c=10.0 and we will increase his meds further (he doesn't want insulin if it can be avoided...    We reviewed prob list, meds, xrays and labs> see below for updates>> LABS 7/13:  FLP- at goals on Simva40 x TG=179;  Chems- ok x BS=207 & A1c=10.0.Marland KitchenMarland Kitchen  ~  April 24, 2012:  21mo ROV & James Parsons reports a lot going on over the last few months> he had right TKR by [facility] 11/13;  He reports several recent falls- ?etiology but I wonder about his numerous pain meds- he states pain incr in his right knee & lower back since then;  He continues to follow up w/ DrRauck/[place] at the pain clinic every month...  He  developed an episode of hematuria 04/11/12 & went to the ER w/ +blood & rare wbc- treated w/ Cipro & referred to Urology, seen by DrMacDiarmid 1/14, & pt was to f/u w/ CT Abd and Cystoscopy- follow up note is pending;  We reviewed the following medical problems during today's office visit >>     COPD> ex-smoker, not on inhaled meds; he denies cough, sput, hemoptysis, ch in SOB, etc...    HBP> on Catapress 0.1mg  Bid; BP= 144/80 & he denies CP/angina, palpit, dizzy, ch in SOB, edema...    VI, Hx cellulitis> off prev antibiotics now; he had local infection from laceration w/ a fall...    Hyperlipidemia> on Simva40; last FLP 7/13 showed TChol 154, TG 179, HDL 36, LDL 82    DM> on Metform500-2Bid, Glimep4, Januv100; labs 1/14 showed BS=247, last A1c was 7/13= 10.0    Obese> wt = 256#, up 14# & we reviewed diet, exercise, wt reduction strategies...    GI- Divertics, Polyps, Constip> he is supposed to be on Miralax & Senakot-S due to his narcotic pain meds; he is overdue for f/u colonoscopy w/ DrPerry...    GU- Hx prostatitis,episode of gross hematuria> outpt evaluation by DrMacdiarmid w/ CT Abd & Cysto- results pending (we don't have f/u notes)...    LBP, Chr pain syndrome> on MULT meds + injections & sp cord stim; followed by Peterson Lombard at the Pain Clinic on Fentenyl SQ infusion, Persocet10, OxyIR5, Ultram50, Lyrica150Bid, Celebrex200Bid, Flexeril10Bid, Lidoderm... We do not have notes from the pain doctors & we rely on pt hx for the current list...    Vit D defic> last Vit D level 1/13 was 21 & he is rec to take 2000u OTC VitD daily... We reviewed prob list, meds, xrays and labs> see below for updates >> he had the 2013 flu vaccine 10/13; he also received the Pneumonia vaccine 1/13 at age 76... LABS 1/14 via ER:  Chems- ok x BS=247 Creat=0.63;  CBC- hg=11.4;  UA- TNTC rbc...          Problem List:   COPD (ICD-496) - former 3ppd smoker, quit 2003... He states "my breathing is ok" & denies cough, phlegm,  change in SOB, etc; he is sedentary due to chronic back pain... 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 ~  CXR 1/13 showed low lung volumes, mild pleural thickening (extrapleural fat), lungs clear, NAD... ~  PFT 1/13 showed FVC=2.65 (59%), FEV1=1.89 (54%), %1sec=71, mid-flows=35% pred; c/w mild obstructive & superimposed restrictive defect... ~  CXR 11/13 showed mild cardiomeg, clear lungs, neurostim electrodes seen, mild scoliosis & degen changes...  HYPERTENSION, BORDERLINE (ICD-401.9) - his  BP has been elev intermittently in the past, but he states just when he is in pain... DrRauck has him on CLONIDINE 0.1mg  Bid which obviously also helps his pressure... ~  5/11:  BP= 116/70 & he denies HA, visual changes, CP, palipit, syncope, ch in dyspnea, edema, etc... ~  11/11:  BP= 160/90 but he is having considerable pain recently he says & he doesn't want additional meds. ~  1/13:  BP= 132/88 & he denies CP, palpit, ch in SOB, syncope, edema, etc.. ~  7/13:  BP= 142/86 & he remains stable from the CV standpoint, still too sedentary due to his back problems... ~  11/13:  EKG showed NSR, rate93, wnl, NAD... ~  1/14: on Catapress 0.1mg  Bid; BP= 144/80 & he denies CP/angina, palpit, dizzy, ch in SOB, edema...  VENOUS INSUFFICIENCY (ICD-459.81) - hx chr venous insuffic and cellulitis in the past; no recent soft tissue infections; he follows a low sodium diet, elevates legs, wears support hose when necessary...  HYPERLIPIDEMIA (ICD-272.4) - supposed to be on SIMVASTATIN 40mg /d, but he has been filling this irregularly (prev on Vytorin 10-40 but he is very erratic at filling that); we discussed low chol/ low fat diet, incr exercise, get weight down! ~  FLP 5/08 showed TChol 190, TG 164, HDL 36, LDL 121... ?taking Vytor10-40? ~  FLP 6/10 showed TChol 224, TG 183, HDL 31, LDL 176... rec> Rx Simva40. ~  FLP 11/10 on Simva40 showed TChol 159, TG 94, HDL 43, LDL 98... continue same. ~   pt stopped filling his perscription for Simva40 ~1/11... ~  FLP 5/11 showed TChol 240, TG 205, HDL 35, LDL 178... needs med> restart Simva40 & stay on it! ~  FLP 11/11 showed TChol 158, TG 181, HDL 32, LDL 90 ~  FLP 1/13 ?on Simva40 showed TChol 186, TG 99, HDL 48, LDL 119... rec to take the Simva40 every night. ~  FLP 7/13 on Simva40 showed TChol 154, TG 179, HDL 36, LDL 82... Needs better low fat diet & wt reduction.  DIABETES MELLITUS (ICD-250.00) - supposed to be on METFORMIN 500mg - 2Bid & GLIMEPIRIDE 2mg  Qam but Pharm confirms irregular filling of prescriptions; we discussed low carb diet, exercise & wt reduction! ~  labs 2/08 showed BS= 107, A1c= 6.4... On Metformin500Bid. ~  labs 5/08 showed BS= 99, A1c= 6.2.Marland KitchenMarland Kitchen Continue same. ~  labs 6/10 showed BS= 127, A1c= 6.6.[name]. rec> take med regularly! ~  labs 11/10 showed BS= 145, A1c= 7.4.[name]. rec> add Glimep 1mg Qam (only took it for 2-37mo).  ~  labs 5/11 showed BS= 118, A1c= 6.8.[name]. if he can lose weight he can avoid more meds... ~  labs 11/11 showed BS= 385, A1c= 10.6.Marland KitchenMarland Kitchen ?what happened? rec> incr Metform500-2Bid, Add Glimep2mg /d. ~  Labs 1/13 showed BS= 137, A1c= 7.7.Marland KitchenMarland Kitchen Pharm confirms poor med compliance; asked to take meds regularly so we can assess efficacy. ~  Labs 7/13 on Metform500-2Bid+Glim2 showed BS= 207, A1c= 10.0.Marland KitchenMarland Kitchen Rec- incr Glim4mg Qam & add JANUVIA 100mg Qpm... ~  Labs 1/14 on showed BS=247...  OBESITY (ICD-278.00) - he is not dieting/ exercising/ or trying to lose weight in any fashion!!!  We discussed weight reducing diet strategies and exercise program that he should be able to perform. ~  last weight <230# was 1995 @ 226#... range over 15 yrs= 238-278# ~  weight 5/08 = 239# ~  weight 10/08 = 253# ~  weight 6/10 = 278# ~  weight 11/10 = 273# ~  weight 5/11 = 270# ~  weight 11/11 = 262# ~  Weight 1/13 = 264# ~  Weight 7/13 = 242#... Great job w/ wt loss! ~  Weight 1/14 = 256#  DIVERTICULOSIS OF COLON (ICD-562.10),  COLONIC  POLYPS (ICD-211.3),  CONSTIPATION (ICD-564.00) - he is instructed to take MIRALAX Bid & SENAKOT-S 2Qhs for his narcotic induced constipation problem... ~  last colonoscopy 1/07 by DrPerry showed divertics, 1mm polyp (not retrieved)... f/u planned 9yrs. ~  1/13:  He is overdue for GI follow up & we will send referral/ reminder to DrPerry's office...  Hx of PROSTATITIS (ICD-601.0) - hx acute prostatitis treated in the 1990's by DrPeterson... HEMATURIA >> episode of gross hematuria 1/14 treated w/ antibiotics & referred to Urology- seen by DrMacDiarmid & w/u in progress... ~  labs 6/10 showed PSA= 0.48 ~  labs 5/11 showed PSA= 0.28 ~  11/11: notes some irritation on penis- Rx Lotrisone cream trial. ~  Labs 1/13 showed PSA= 0.29 ~  1/14: Episode gross hematuria> eval by DrMacDiarmid pending...  OSTEOARTHRITIS >> s/p right TKR 11/13 by DrRowan... 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 followed by Vito Berger al at the [facility]: he has a spinal cord stimulator, and subcut pain pump w/ Fentanyl (reaction to MS)...  ~  2/04:  eval by Tora Perches w/ HNP L3-4 right w/ spinal stenosis, lateral recessed stenosis- had microdiscectomy & decompression. ~  2/08:  Myelogram by DrJJenkins w/ multilevel cervical DDD & stenosis- s/p ant cerv discectomy & fusion w/ plating at C3-4. ~  6/10: current meds from DrRauch includes--- Percocet 10mg - Tid; Tramadol 50mg - 1 to 2 Qid; Celebrex 200mg Bid; Lidoderm patches; Lyrica 150mg Bid; Cymbalta 60mg /d; Flexeril 10mg Tid; Clonidine0.1mg - 1.2 Bid for RSD... ~  11/10: pt reports Myelogram & CT scan showed 5 discs & spinal stenosis... he is to see Neurosurg soon. ~  5/11:  he reports monthly OV's w/ Pain Management & continues on 8 meds listed... ~  Imaging data in Epic showed CT Myelogram CSpine 3/12 by DrJJenkins> prev fusion C3-4 is satis, progressive  spondylosis & facet degen at C2-3, unchanged spondy & DDD at C4-5, mild sp stenosis at several levels. ~  1/13:  He continues on same pain med regimen from DrRauch etal> we do not have notes from Pain management or Neurosurg... ~  11/13:  S/p right TKR by DrRowan...  VITAMIN D DEFICIENCT >> rec to take OTC Vit D supplement ~2000u daily... ~  Labs 1/13 showed Vit D level = 21... rec to start OTC Vit D supplement ~2000u daily...  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   Past Surgical History  Procedure Date  . Back surgery [id number],[id number]  . Morphine pump 2009    due to reaction to morphine  . Excision of lipoma from right olecranon area 11/2000    Dr. Zachery Dakins  . Microdiscectomy and decompression 05/2002    Dr. Shelle Iron  . C3-4 anterior cervical discectomy and fusion with plating at c3-4 05/2006    Dr. Lovell Sheehan  . Spinal cord stimulator implanted     for pain per Dr. Haskel Khan  . Subcut pain pump implanted   . Pain pump implantation     with fentanyl  . Tonsillectomy   . Colonoscopy w/ polypectomy   . Knee arthroscopy  right knee  . 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 Total knee arthroplasty 02/29/2012    Procedure: TOTAL KNEE ARTHROPLASTY;  Surgeon: Nestor Lewandowsky, MD;  Location: [facility] OR;  Service: Orthopedics;  Laterality: Right;    Outpatient Encounter Prescriptions as of 04/24/2012  Medication Sig Dispense Refill  . celecoxib (CELEBREX) 200 MG capsule Take 200 mg by mouth 2 (two) times daily.        . ciprofloxacin (CIPRO) 250 MG tablet Take 2 tablets (500 mg total) by mouth 2 (two) times daily. Pt started on 04-11-12 for a total therapy for 3 days.  20 tablet  0  . ciprofloxacin (CIPRO) 250 MG tablet Take 1 tablet (250 mg total) by mouth 2 (two) times daily.  14 tablet  0  . cloNIDine (CATAPRES) 0.1 MG tablet Take 1 tablet (0.1 mg total) by  mouth 2 (two) times daily.  180 tablet  3  . cyclobenzaprine (FLEXERIL) 10 MG tablet Take 10 mg by mouth 2 (two) times daily as needed. Muscle spasm.      . CYMBALTA 60 MG capsule Take 60 mg by mouth daily.       . ENDOCET 10-325 MG per tablet Take 1 tablet by mouth 3 (three) times daily as needed. pain      . fentaNYL 0.05 MG/ML SOLN 5,000 mcg Inject into the skin continuous. Patient will bring dosage instructions with him. Daily dose 0.84mg       . glimepiride (AMARYL) 4 MG tablet Take 1 tablet (4 mg total) by mouth daily before breakfast.  90 tablet  3  . lidocaine (LIDODERM) 5 % Place 1 patch onto the skin daily as needed. Pain. Remove & Discard patch within 12 hours      . LYRICA 150 MG capsule Take 150 mg by mouth 2 (two) times daily.       . metFORMIN (GLUCOPHAGE) 500 MG tablet TAKE 2 TABLETS BY MOUTH TWO TIMES DAILY  360 tablet  3  . oxyCODONE (OXY IR/ROXICODONE) 5 MG immediate release tablet Take 1-2 tablets (5-10 mg total) by mouth every 3 (three) hours as needed for pain (breakthrough pain post-op).  60 tablet  0  . simvastatin (ZOCOR) 40 MG tablet TAKE 1 TABLET (40 MG TOTAL) BY MOUTH AT BEDTIME.  90 tablet  3  . sitaGLIPtin (JANUVIA) 100 MG tablet Take 1 tablet (100 mg total) by mouth daily.  90 tablet  3  . traMADol (ULTRAM) 50 MG tablet Take 50 mg by mouth 3 (three) times daily as needed. pain        Allergies  Allergen Reactions  . Codeine Phosphate     REACTION: itching  . Morphine And Related     Current Medications, Allergies, Past Medical History, Past Surgical History, Family History, and Social History were reviewed in [name] record.    Review of Systems         See HPI - .all other systems neg except as noted... The patient complains of dyspnea on exertion, peripheral edema, muscle weakness, and difficulty walking due to chr LBP (has cane).  The patient denies anorexia, fever, weight loss, weight gain, vision loss, decreased hearing,  hoarseness, chest pain, syncope, prolonged cough, headaches, hemoptysis, abdominal pain, melena, hematochezia, severe indigestion/heartburn, hematuria, incontinence, suspicious skin lesions, transient blindness, depression, unusual weight change, abnormal bleeding, enlarged lymph nodes, and angioedema.     Objective:   Physical Exam     WD, Obese, 67 y/o WM  in NAD... GENERAL:  Alert & oriented; pleasant & cooperative. HEENT:  [place]/AT, EOM-wnl, PERRLA, EACs-clear, TMs-wnl, NOSE-clear, THROAT-clear & wnl. NECK:  Supple w/ decrROM & ant cerv scar; no JVD; normal carotid impulses w/o bruits; no thyromegaly or nodules palpated; no lymphadenopathy. CHEST:  Clear to P & A; without wheezes/ rales/ or rhonchi heard... HEART:  Regular Rhythm; without murmurs/ rubs/ or gallops detected... ABDOMEN:  Obese, soft & nontender; decr bowel sounds; no organomegaly or masses palpated... EXT: s/p right TKR, mod arthritic changes; no varicose veins/ +venous insuffic/ tr edema. NEURO:  CN's intact; motor testing normal (lim by pain); sensory testing normal; abn gait & balance fair... Ambulates w/ cane, scar of prev Lumbar surg, & has implanted sp cord stimulator. DERM:  Mod lipoma in right antecubital area...  RADIOLOGY DATA:  Reviewed in the EPIC EMR & discussed w/ the patient...  LABORATORY DATA:  Reviewed in the EPIC EMR & discussed w/ the patient...   Assessment:      COPD>  Former 3ppd smoker & quit 2003;  CXR w/ COPD/emphysema, NAD;  PFT w/ combined mild obstructive & restrictive dis;  He denies brewathing problems & doesn't want inhalers etc...  HBP>  Controlled on diet & Clonidine (per DrRauch);  States BP is only elev when he is in pain...  Ven Insuffic>  He knows to avoid sodium, elev legs, wear support hose etc...  Hyperlipid>  On Simva40 but w/ suboptimal medication & diet compliance;  Asked to take med daily...  DM>  On Metformin + Glimep as above;  Compliance has been poor & asked to take  regularly so we can assess it's efficacy; A1c remains ~10 therefore increase meds, add Januvia.  Obesity>  Unable to exercise he says due to chronic back pain;  We reviewed wt reducing diet strategies ==> weight is down 22# "on diet" he says...  GI> Divertics, Polyps, Constip>  He is overdue for f/u colonoscopy & we will refer chart to GI...  GU- Hematuria>  eval in progress from DrMacDiarmid...  DJD/ LBP/ RSD/ Chronic Pain Syndrome>  This is his main problem that consumes his life 24/7; Managed & followed by Pain Management DrRauch etal... S/p right TKR by [facility] 11/13...     Plan:     Patient's Medications  New Prescriptions   No medications on file  Previous Medications   CELECOXIB (CELEBREX) 200 MG CAPSULE    Take 200 mg by mouth 2 (two) times daily.     CIPROFLOXACIN (CIPRO) 250 MG TABLET    Take 2 tablets (500 mg total) by mouth 2 (two) times daily. Pt started on 04-11-12 for a total therapy for 3 days.   CIPROFLOXACIN (CIPRO) 250 MG TABLET    Take 1 tablet (250 mg total) by mouth 2 (two) times daily.   CLONIDINE (CATAPRES) 0.1 MG TABLET    Take 1 tablet (0.1 mg total) by mouth 2 (two) times daily.   CYCLOBENZAPRINE (FLEXERIL) 10 MG TABLET    Take 10 mg by mouth 2 (two) times daily as needed. Muscle spasm.   CYMBALTA 60 MG CAPSULE    Take 60 mg by mouth daily.    ENDOCET 10-325 MG PER TABLET    Take 1 tablet by mouth 3 (three) times daily as needed. pain   FENTANYL 0.05 MG/ML SOLN 5,000 MCG    Inject into the skin continuous. Patient will bring dosage instructions with him. Daily dose 0.84mg    GLIMEPIRIDE (AMARYL) 4 MG TABLET    Take 1  tablet (4 mg total) by mouth daily before breakfast.   LIDOCAINE (LIDODERM) 5 %    Place 1 patch onto the skin daily as needed. Pain. Remove & Discard patch within 12 hours   LYRICA 150 MG CAPSULE    Take 150 mg by mouth 2 (two) times daily.    METFORMIN (GLUCOPHAGE) 500 MG TABLET    TAKE 2 TABLETS BY MOUTH TWO TIMES DAILY   OXYCODONE (OXY  IR/ROXICODONE) 5 MG IMMEDIATE RELEASE TABLET    Take 1-2 tablets (5-10 mg total) by mouth every 3 (three) hours as needed for pain (breakthrough pain post-op).   SIMVASTATIN (ZOCOR) 40 MG TABLET    TAKE 1 TABLET (40 MG TOTAL) BY MOUTH AT BEDTIME.   SITAGLIPTIN (JANUVIA) 100 MG TABLET    Take 1 tablet (100 mg total) by mouth daily.   TRAMADOL (ULTRAM) 50 MG TABLET    Take 50 mg by mouth 3 (three) times daily as needed. pain  Modified Medications   No medications on file  Discontinued Medications   No medications on file

## 2012-04-24 NOTE — Patient Instructions (Addendum)
Today we updated your med list in our EPIC system...    Continue your current medications the same...  Let's get on track w/ our diet & exercise program...    The goal is to lose 15-20 lbs...  Call for any questions...  Let's plan a follow up visit in 6 months w/ FASTING blood work at that time.Marland KitchenMarland Kitchen

## 2012-05-09 ENCOUNTER — Telehealth: Payer: Self-pay | Admitting: Pulmonary Disease

## 2012-05-09 NOTE — Telephone Encounter (Signed)
Per SN---katie you are 100% correct  and i totally agree.  DM has its own risk of blindness, renal failure, MI/CHF alone if uncontrolled.  So the risk is much greater without the medication.  If he insists on not taking the medication we will need to schedule an appt with SN to discuss and change medications.  thanks

## 2012-05-09 NOTE — Telephone Encounter (Signed)
Spoke with patient-states that he seen that Januvia can cause cancer- I explained to patient that during the study for getting med on the market that even if 1 person in the study got cancer it had to be reported. That as long as he is being followed by a provider such as SN he should be okay. Pt wanted me to send this to SN to agree with me and let him know. Pt states he has appointment at 4:00pm today and okay to leave a detailed message on his answering machine.

## 2012-05-09 NOTE — Telephone Encounter (Signed)
Pt aware of SN agreeing with me. Pt states he will continue taking Januiva. He appreciates SN taking the time out to address this issue for him.

## 2012-05-17 DIAGNOSIS — M47812 Spondylosis without myelopathy or radiculopathy, cervical region: Secondary | ICD-10-CM | POA: Insufficient documentation

## 2012-05-17 DIAGNOSIS — M4800 Spinal stenosis, site unspecified: Secondary | ICD-10-CM | POA: Insufficient documentation

## 2012-05-17 DIAGNOSIS — M4804 Spinal stenosis, thoracic region: Secondary | ICD-10-CM | POA: Insufficient documentation

## 2012-10-07 ENCOUNTER — Other Ambulatory Visit: Payer: Self-pay | Admitting: Pulmonary Disease

## 2012-10-09 DIAGNOSIS — M961 Postlaminectomy syndrome, not elsewhere classified: Secondary | ICD-10-CM | POA: Insufficient documentation

## 2012-10-09 DIAGNOSIS — M545 Low back pain, unspecified: Secondary | ICD-10-CM | POA: Insufficient documentation

## 2012-10-18 ENCOUNTER — Other Ambulatory Visit: Payer: Self-pay | Admitting: Pulmonary Disease

## 2012-10-18 MED ORDER — SITAGLIPTIN PHOSPHATE 100 MG PO TABS: ORAL_TABLET | ORAL | Status: DC

## 2012-10-23 ENCOUNTER — Ambulatory Visit: Payer: Medicare PPO | Admitting: Pulmonary Disease

## 2012-11-02 ENCOUNTER — Encounter: Payer: Self-pay | Admitting: Pulmonary Disease

## 2012-11-02 ENCOUNTER — Other Ambulatory Visit (INDEPENDENT_AMBULATORY_CARE_PROVIDER_SITE_OTHER): Payer: Medicare PPO

## 2012-11-02 ENCOUNTER — Ambulatory Visit (INDEPENDENT_AMBULATORY_CARE_PROVIDER_SITE_OTHER): Payer: Medicare PPO | Admitting: Pulmonary Disease

## 2012-11-02 VITALS — BP 142/92 | HR 96 | Temp 98.4°F | Ht 67.0 in | Wt 239.4 lb

## 2012-11-02 DIAGNOSIS — E669 Obesity, unspecified: Secondary | ICD-10-CM

## 2012-11-02 DIAGNOSIS — F419 Anxiety disorder, unspecified: Secondary | ICD-10-CM

## 2012-11-02 DIAGNOSIS — E785 Hyperlipidemia, unspecified: Secondary | ICD-10-CM

## 2012-11-02 DIAGNOSIS — IMO0002 Reserved for concepts with insufficient information to code with codable children: Secondary | ICD-10-CM

## 2012-11-02 DIAGNOSIS — N401 Enlarged prostate with lower urinary tract symptoms: Secondary | ICD-10-CM

## 2012-11-02 DIAGNOSIS — F411 Generalized anxiety disorder: Secondary | ICD-10-CM

## 2012-11-02 DIAGNOSIS — E1165 Type 2 diabetes mellitus with hyperglycemia: Secondary | ICD-10-CM

## 2012-11-02 DIAGNOSIS — M545 Low back pain, unspecified: Secondary | ICD-10-CM

## 2012-11-02 DIAGNOSIS — N139 Obstructive and reflux uropathy, unspecified: Secondary | ICD-10-CM

## 2012-11-02 DIAGNOSIS — D649 Anemia, unspecified: Secondary | ICD-10-CM

## 2012-11-02 DIAGNOSIS — I10 Essential (primary) hypertension: Secondary | ICD-10-CM

## 2012-11-02 DIAGNOSIS — E118 Type 2 diabetes mellitus with unspecified complications: Secondary | ICD-10-CM

## 2012-11-02 DIAGNOSIS — N138 Other obstructive and reflux uropathy: Secondary | ICD-10-CM

## 2012-11-02 DIAGNOSIS — E119 Type 2 diabetes mellitus without complications: Secondary | ICD-10-CM | POA: Insufficient documentation

## 2012-11-02 DIAGNOSIS — J449 Chronic obstructive pulmonary disease, unspecified: Secondary | ICD-10-CM

## 2012-11-02 DIAGNOSIS — G9050 Complex regional pain syndrome I, unspecified: Secondary | ICD-10-CM

## 2012-11-02 DIAGNOSIS — M1990 Unspecified osteoarthritis, unspecified site: Secondary | ICD-10-CM

## 2012-11-02 LAB — CBC WITH DIFFERENTIAL/PLATELET
Eosinophils Relative: 2.3 % (ref 0.0–5.0)
HCT: 38.4 % — ABNORMAL LOW (ref 39.0–52.0)
Hemoglobin: 12.6 g/dL — ABNORMAL LOW (ref 13.0–17.0)
Lymphs Abs: 1.9 10*3/uL (ref 0.7–4.0)
Monocytes Relative: 4.7 % (ref 3.0–12.0)
Platelets: 193 10*3/uL (ref 150.0–400.0)
WBC: 9.8 10*3/uL (ref 4.5–10.5)

## 2012-11-02 LAB — LIPID PANEL
LDL Cholesterol: 86 mg/dL (ref 0–99)
Total CHOL/HDL Ratio: 5
VLDL: 32.6 mg/dL (ref 0.0–40.0)

## 2012-11-02 LAB — BASIC METABOLIC PANEL
BUN: 8 mg/dL (ref 6–23)
GFR: 128.08 mL/min (ref >60.00)
Potassium: 4.8 mEq/L (ref 3.5–5.1)
Sodium: 135 mEq/L (ref 135–145)

## 2012-11-02 LAB — TSH: TSH: 2.07 u[IU]/mL (ref 0.35–5.50)

## 2012-11-02 MED ORDER — GLIMEPIRIDE 4 MG PO TABS: 4.0000 mg | ORAL_TABLET | Freq: Every day | ORAL | Status: DC

## 2012-11-02 MED ORDER — SITAGLIPTIN PHOSPHATE 100 MG PO TABS: ORAL_TABLET | ORAL | Status: DC

## 2012-11-02 MED ORDER — CLONIDINE HCL 0.1 MG PO TABS: 0.1000 mg | ORAL_TABLET | Freq: Two times a day (BID) | ORAL | Status: DC

## 2012-11-02 MED ORDER — METFORMIN HCL 500 MG PO TABS: ORAL_TABLET | ORAL | Status: DC

## 2012-11-02 MED ORDER — SIMVASTATIN 40 MG PO TABS: ORAL_TABLET | ORAL | Status: DC

## 2012-11-02 MED ORDER — TAMSULOSIN HCL 0.4 MG PO CAPS: 0.4000 mg | ORAL_CAPSULE | Freq: Every day | ORAL | Status: DC

## 2012-11-02 NOTE — Progress Notes (Signed)
Subjective:     Patient ID: James James, male   DOB: 03/04/1946, 67 y.o.   MRN: [id number]  HPI 67 y/o WM here for a follow up visit... he has multiple medical problems including COPD (former heavy smoker);  HBP;  Hyperlipidemia & DM;  Obesity;  Divertics & Polyps;  LBP, RSD, & chr pain syndrome...  ~  April 22, 2011:  5mo ROV & his complaints revolve around his chronic pain & his pain meds which are managed by his Pain Clinic- James Parsons/ James James who "burned the nerves in my back" & continue to treat him w/ SQ pain pump w/ Fentenyl, spinal cord stimulator, Oxycodone, Cymbalta, Lyrica, Celebrex, Flexeril, Ultram, Lidoderm; we do not have notes from the pain clinic ...  We reserved our follow up eval & discussion to his medical problems which he continues to ignore since his chronic pain issues are all that he cares about... James James did not bring med bottles or list today> identifies meds as "red pill" "green pill" etc (but we did call his Pharm- [facility] on [address] for info on med compliance)...    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    >BP controlled on low sodium diet + Clonidine 0.1mg Bid started by James James for his pain syndrome; he notes BP elev when his pain is worse; BP today= 132/88 & he notes similar at home...    >Chol is controlled by Simva40 (plus diet) and f/u FLP showed LDL=119 likely related to medication compliance issues...    >When last seen 11/11 his BS=385 & A1c=10.6> Metformin was incr to 1000Bid & Glimep 2mg Qam added; pt never returned as requested & Pharm confirms that he hasn't been taking either med regularly; weight is unchanged at 264# &  he has not been on diet & can't exercise due to his back; labs today showed BS=137, A1c=7.7 and he is asked to take the current meds regularly & return so we can assess their true efficacy before recommending any adjustments; we reviewed diet as well...    He saw James James [facility] for recurrent lipoma right upper arm but he didn't think it was contributing to his pain therefore didn't rec surg...    He requests Ortho referral for someone to check his knees...    He requests Flu shot today & in need of Pneumovax as well...  ~  October 18, 2011:  8mo ROV & James James remains in pain all the time every day; still sees James Parsons every month w/ spinal cord stimulator & pain pump under the skin, plus Oxycodone, Flexeril, Celebrex, Tramadol, Lyrica, Cymbalta...  Somehow he has lost 22# down to 242# today "on diet" he says; unfortunately his DM control is no better w/ A1c=10.0 and we will increase his meds further (he doesn't want insulin if it can be avoided...    We reviewed prob list, meds, xrays and labs> see below for updates>> LABS 7/13:  FLP- at goals on Simva40 x TG=179;  Chems- ok x BS=207 & A1c=10.0.Marland KitchenMarland Kitchen  ~  April 24, 2012:  8mo ROV & James James reports a lot going on over the last few months> he had right TKR by James James 11/13;  He reports  several recent falls- ?etiology but I wonder about his numerous pain meds- he states pain incr in his right knee & lower back since then;  He continues to follow up w/ DrRauck/[place] at the pain clinic every month...  He developed an episode of hematuria 04/11/12 & went to the ER w/ +blood & rare wbc- treated w/ Cipro & referred to Urology, seen by James James 1/14, & pt was to f/u w/ CT Abd and Cystoscopy- follow up note is pending;  We reviewed the following medical problems during today's office visit >>     COPD> ex-smoker, not on inhaled meds; he denies cough, sput, hemoptysis, ch in SOB, etc...    HBP> on Catapress 0.1mg  Bid; BP= 144/80 & he denies CP/angina, palpit, dizzy, ch in  SOB, edema...    VI, Hx cellulitis> off prev antibiotics now; he had local infection from laceration w/ a fall...    Hyperlipidemia> on Simva40; last FLP 7/13 showed TChol 154, TG 179, HDL 36, LDL 82    DM> on Metform500-2Bid, Glimep4, Januv100; labs 1/14 showed BS=247, last A1c was 7/13= 10.0    Obese> wt = 256#, up 14# & we reviewed diet, exercise, wt reduction strategies...    GI- Divertics, Polyps, Constip> he is supposed to be on Miralax & Senakot-S due to his narcotic pain meds; he is overdue for f/u colonoscopy w/ James Parsons...    GU- Hx prostatitis,episode of gross hematuria> outpt evaluation by James James w/ CT Abd & Cysto- results pending (we don't have f/u notes)...    LBP, Chr pain syndrome> on MULT meds + injections & sp cord stim; followed by James James at the Pain Clinic on Fentenyl SQ infusion, Persocet10, OxyIR5, Ultram50, Lyrica150Bid, Celebrex200Bid, Flexeril10Bid, Lidoderm... We do not have notes from the pain doctors & we rely on pt hx for the current list...    Vit D defic> last Vit D level 1/13 was 21 & he is rec to take 2000u OTC VitD daily... We reviewed prob list, meds, xrays and labs> see below for updates >> he had the 2013 flu vaccine 10/13; he also received the Pneumonia vaccine 1/13 at age 61... LABS 1/14 via ER:  Chems- ok x BS=247 Creat=0.63;  CBC- hg=11.4;  UA- TNTC rbc...  ~  November 02, 2012:  69mo ROV & James Parsons has lost 15# down to 239# today on diet; still not able to exercise much & walks w/ a cane due to his back & knee pain;  His CC today is a decr in urinary stream, LTOS, nocturia- we discussed trial of Flomax0.4mg Qhs...    BP= 140/90 on Clonidine0.1Bid (primarily prescribed for his RSD pain by James Parsons); he denies CP, palpit, ch in SOB, edema...    On Simva40 for his lipids; wt down 15# on diet & exercise; FLP today shows TChol 149, TG 163, HDL 30, LDL 86    He remains on Metform500-2Bid, Glimep4, Januv100; BS=255, A1c=9.8 despite the wt reduction; Rec- continue  diet/ exerc and incr Glimep4mg Bid, next step is insulin...    His main prob= LBP, chr pain syndrome, DJD; followed by James Parsons pain mamagement on MULT meds> spinal cord stim, Fentenyl SQ infusion, OxyIR, Endocet, Celebrex, Ultram, Lyrica, Lidoderm, Flexeril, Cymbalta... We reviewed prob list, meds, xrays and labs> see below for updates >>  LABS 7/14:  FLP- Chol ok on Simva40 but elevTG;  Chems- ok x BS=255 A1c=9.8;  CBC- wnl;  TSH=2.07...           Problem List:   COPD (ICD-496) -  former 3ppd smoker, quit 2003... He states "my breathing is ok" & denies cough, phlegm, change in SOB, etc; he is sedentary due to chronic back pain... 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 ~  CXR 1/13 showed low lung volumes, mild pleural thickening (extrapleural fat), lungs clear, NAD... ~  PFT 1/13 showed FVC=2.65 (59%), FEV1=1.89 (54%), %1sec=71, mid-flows=35% pred; c/w mild obstructive & superimposed restrictive defect... ~  CXR 11/13 showed mild cardiomeg, clear lungs, neurostim electrodes seen, mild scoliosis & degen changes...  HYPERTENSION, BORDERLINE (ICD-401.9) - his BP has been elev intermittently in the past, but he states just when he is in pain... DrRauck has him on CLONIDINE 0.1mg  Bid which obviously also helps his pressure... ~  5/11:  BP= 116/70 & he denies HA, visual changes, CP, palipit, syncope, ch in dyspnea, edema, etc... ~  11/11:  BP= 160/90 but he is having considerable pain recently he says & he doesn't want additional meds. ~  1/13:  BP= 132/88 & he denies CP, palpit, ch in SOB, syncope, edema, etc.. ~  7/13:  BP= 142/86 & he remains stable from the CV standpoint, still too sedentary due to his back problems... ~  11/13:  EKG showed NSR, rate93, wnl, NAD... ~  1/14: on Catapress 0.1mg  Bid; BP= 144/80 & he denies CP/angina, palpit, dizzy, ch in SOB, edema... ~  7/14: BP= 140/90 on Clonidine0.1Bid (primarily prescribed for his RSD pain by James Parsons); he denies CP,  palpit, ch in SOB, edema.  VENOUS INSUFFICIENCY (ICD-459.81) - hx chr venous insuffic and cellulitis in the past; no recent soft tissue infections; he follows a low sodium diet, elevates legs, wears support hose when necessary...  HYPERLIPIDEMIA (ICD-272.4) - supposed to be on SIMVASTATIN 40mg /d, but he has been filling this irregularly (prev on Vytorin 10-40 but he is very erratic at filling that); we discussed low chol/ low fat diet, incr exercise, get weight down! ~  FLP 5/08 showed TChol 190, TG 164, HDL 36, LDL 121... ?taking Vytor10-40? ~  FLP 6/10 showed TChol 224, TG 183, HDL 31, LDL 176... rec> Rx Simva40. ~  FLP 11/10 on Simva40 showed TChol 159, TG 94, HDL 43, LDL 98... continue same. ~  pt stopped filling his perscription for Simva40 ~1/11... ~  FLP 5/11 showed TChol 240, TG 205, HDL 35, LDL 178... needs med> restart Simva40 & stay on it! ~  FLP 11/11 showed TChol 158, TG 181, HDL 32, LDL 90 ~  FLP 1/13 ?on Simva40 showed TChol 186, TG 99, HDL 48, LDL 119... rec to take the Simva40 every night. ~  FLP 7/13 on Simva40 showed TChol 154, TG 179, HDL 36, LDL 82... Needs better low fat diet & wt reduction. ~  FLP 7/14 on Simva40 showed TChol 149, TG 163, HDL 30, LDL 86   DIABETES MELLITUS (ICD-250.00) >> hx of intermittent dosing & med noncompliance in the past... ~  labs 2/08 showed BS= 107, A1c= 6.4... On Metformin500Bid. ~  labs 5/08 showed BS= 99, A1c= 6.2.Marland KitchenMarland Kitchen Continue same. ~  labs 6/10 showed BS= 127, A1c= 6.6.[name]. rec> take med regularly! ~  labs 11/10 showed BS= 145, A1c= 7.4.[name]. rec> add Glimep 1mg Qam (only took it for 2-27mo).  ~  labs 5/11 showed BS= 118, A1c= 6.8.[name]. if he can lose weight he can avoid more meds... ~  labs 11/11 showed BS= 385, A1c= 10.6.Marland KitchenMarland Kitchen ?what happened? rec> incr Metform500-2Bid, Add Glimep2mg /d. ~  Labs 1/13 showed BS= 137, A1c= 7.7.Marland KitchenMarland Kitchen  Pharm confirms poor med compliance; asked to take meds regularly so we can assess efficacy. ~  Labs 7/13 on  Metform500-2Bid+Glim2 showed BS= 207, A1c= 10.0.Marland KitchenMarland Kitchen Rec- incr Glim4mg Qam & add JANUVIA 100mg Qpm... ~  Labs 1/14 on showed BS=247... ~  He remains on Metform500-2Bid, Glimep4, Januv100; BS=255, A1c=9.8 despite the wt reduction; Rec- continue diet/ exerc and incr Glimep4mg Bid, next step is insulin.   OBESITY (ICD-278.00) - he is not dieting/ exercising/ or trying to lose weight in any fashion!!!  We discussed weight reducing diet strategies and exercise program that he should be able to perform. ~  last weight <230# was 1995 @ 226#... range over 15 yrs= 238-278# ~  weight 5/08 = 239# ~  weight 10/08 = 253# ~  weight 6/10 = 278# ~  weight 11/10 = 273# ~  weight 5/11 = 270# ~  weight 11/11 = 262# ~  Weight 1/13 = 264# ~  Weight 7/13 = 242# ~  Weight 1/14 = 256# ~  Weight 7/14 = 239#  DIVERTICULOSIS OF COLON (ICD-562.10),  COLONIC POLYPS (ICD-211.3),  CONSTIPATION (ICD-564.00) - he is instructed to take MIRALAX Bid & SENAKOT-S 2Qhs for his narcotic induced constipation problem... ~  last colonoscopy 1/07 by James Parsons showed divertics, 1mm polyp (not retrieved)... f/u planned 62yrs. ~  1/13 & 7/14:  He is overdue for GI follow up & we will send referral/ reminder to James Parsons's office...  Hx of PROSTATITIS (ICD-601.0) - hx acute prostatitis treated in the 1990's by DrPeterson... HEMATURIA >> episode of gross hematuria 1/14 treated w/ antibiotics & referred to Urology- seen by James James & w/u in progress... ~  labs 6/10 showed PSA= 0.48 ~  labs 5/11 showed PSA= 0.28 ~  11/11: notes some irritation on penis- Rx Lotrisone cream trial. ~  Labs 1/13 showed PSA= 0.29 ~  1/14: Episode gross hematuria> eval by James James: CT is reported normal; Cysto was neg x enlarged prostate; given sample of Alpha blocker- pt didn't follow up... ~  7/14: His CC today is a decr in urinary stream, LTOS, nocturia- we discussed trial of Flomax0.4mg Qhs.  OSTEOARTHRITIS >> s/p right TKR 11/13 by DrRowan... 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 followed by Vito Berger al at the [facility]: he has a spinal cord stimulator, and subcut pain pump w/ Fentanyl (reaction to MS)...  ~  2/04:  eval by Tora Perches w/ HNP L3-4 right w/ spinal stenosis, lateral recessed stenosis- had microdiscectomy & decompression. ~  2/08:  Myelogram by DrJJenkins w/ multilevel cervical DDD & stenosis- s/p ant cerv discectomy & fusion w/ plating at C3-4. ~  6/10: current meds from James Parsons includes--- Percocet 10mg - Tid; Tramadol 50mg - 1 to 2 Qid; Celebrex 200mg Bid; Lidoderm patches; Lyrica 150mg Bid; Cymbalta 60mg /d; Flexeril 10mg Tid; Clonidine0.1mg - 1.2 Bid for RSD... ~  11/10: pt reports Myelogram & CT scan showed 5 discs & spinal stenosis... he is to see Neurosurg soon. ~  5/11:  he reports monthly OV's w/ Pain Management & continues on 8 meds listed... ~  Imaging data in Epic showed CT Myelogram CSpine 3/12 by DrJJenkins> prev fusion C3-4 is satis, progressive spondylosis & facet degen at C2-3, unchanged spondy & DDD at C4-5, mild sp stenosis at several levels. ~  1/13:  He continues on same pain med regimen from James Parsons etal> we do not have notes from Pain management or Neurosurg... ~  11/13:  S/p right TKR by DrRowan...  VITAMIN D DEFICIENCT >> rec to take OTC Vit D supplement ~2000u daily... ~  Labs 1/13 showed Vit D level = 21... rec to start OTC Vit D supplement ~2000u daily...  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   Past Surgical History  Procedure Laterality Date  . Back surgery  [id number]  . Morphine pump  2009    due to reaction to morphine  . Excision of lipoma from right olecranon area  11/2000    Dr. Zachery Dakins  . Microdiscectomy and decompression  05/2002    Dr.  Shelle Iron  . C3-4 anterior cervical discectomy and fusion with plating at c3-4  05/2006    Dr. Lovell Sheehan  . Spinal cord stimulator implanted      for pain per Dr. Haskel Khan  . Subcut pain pump implanted    . Pain pump implantation      with fentanyl  . Tonsillectomy    . Colonoscopy w/ polypectomy    . Knee arthroscopy      right knee  . 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 Total knee arthroplasty  02/29/2012    Procedure: TOTAL KNEE ARTHROPLASTY;  Surgeon: Nestor Lewandowsky, MD;  Location: [facility] OR;  Service: Orthopedics;  Laterality: Right;    Outpatient Encounter Prescriptions as of 11/02/2012  Medication Sig Dispense Refill  . celecoxib (CELEBREX) 200 MG capsule Take 200 mg by mouth 2 (two) times daily.        . cloNIDine (CATAPRES) 0.1 MG tablet Take 1 tablet (0.1 mg total) by mouth 2 (two) times daily.  180 tablet  3  . cyclobenzaprine (FLEXERIL) 10 MG tablet Take 10 mg by mouth 2 (two) times daily as needed. Muscle spasm.      . CYMBALTA 60 MG capsule Take 60 mg by mouth daily.       . ENDOCET 10-325 MG per tablet Take 1 tablet by mouth 3 (three) times daily as needed. pain      . fentaNYL 0.05 MG/ML SOLN 5,000 mcg Inject into the skin continuous. Patient will bring dosage instructions with him. Daily dose 0.84mg       . lidocaine (LIDODERM) 5 % Place 1 patch onto the skin daily as needed. Pain. Remove & Discard patch within 12 hours      . LYRICA 150 MG capsule Take 150 mg by mouth 2 (two) times daily.       . metFORMIN (GLUCOPHAGE) 500 MG tablet TAKE 2 TABLETS BY MOUTH TWO TIMES DAILY  360 tablet  3  . oxyCODONE (OXY IR/ROXICODONE) 5 MG immediate release tablet Take 1-2 tablets (5-10 mg total) by mouth every 3 (three) hours as needed for pain (breakthrough pain post-op).  60 tablet  0  . simvastatin (ZOCOR) 40 MG tablet TAKE 1 TABLET (40 MG TOTAL) BY MOUTH AT BEDTIME.  90 tablet  3  . sitaGLIPtin (JANUVIA) 100 MG tablet TAKE 1 TABLET (100 MG TOTAL) BY MOUTH DAILY.  90 tablet  0   . traMADol (ULTRAM) 50 MG tablet Take 50 mg by mouth 3 (three) times daily as needed. pain       No facility-administered encounter medications on file as of 11/02/2012.    Allergies  Allergen Reactions  . Codeine Phosphate     REACTION: itching  . Morphine And Related  Current Medications, Allergies, Past Medical History, Past Surgical History, Family History, and Social History were reviewed in [name] record.    Review of Systems         See HPI - .all other systems neg except as noted... The patient complains of dyspnea on exertion, peripheral edema, muscle weakness, and difficulty walking due to chr LBP (has cane).  The patient denies anorexia, fever, weight loss, weight gain, vision loss, decreased hearing, hoarseness, chest pain, syncope, prolonged cough, headaches, hemoptysis, abdominal pain, melena, hematochezia, severe indigestion/heartburn, hematuria, incontinence, suspicious skin lesions, transient blindness, depression, unusual weight change, abnormal bleeding, enlarged lymph nodes, and angioedema.     Objective:   Physical Exam     WD, Obese, 67 y/o WM in NAD... GENERAL:  Alert & oriented; pleasant & cooperative. HEENT:  [place]/AT, EOM-wnl, PERRLA, EACs-clear, TMs-wnl, NOSE-clear, THROAT-clear & wnl. NECK:  Supple w/ decrROM & ant cerv scar; no JVD; normal carotid impulses w/o bruits; no thyromegaly or nodules palpated; no lymphadenopathy. CHEST:  Clear to P & A; without wheezes/ rales/ or rhonchi heard... HEART:  Regular Rhythm; without murmurs/ rubs/ or gallops detected... ABDOMEN:  Obese, soft & nontender; decr bowel sounds; no organomegaly or masses palpated... EXT: s/p right TKR, mod arthritic changes; no varicose veins/ +venous insuffic/ tr edema. NEURO:  CN's intact; motor testing normal (lim by pain); sensory testing normal; abn gait & balance fair... Ambulates w/ cane, scar of prev Lumbar surg, & has implanted sp cord  stimulator. DERM:  Mod lipoma in right antecubital area...  RADIOLOGY DATA:  Reviewed in the EPIC EMR & discussed w/ the patient...  LABORATORY DATA:  Reviewed in the EPIC EMR & discussed w/ the patient...   Assessment:      COPD>  Former 3ppd smoker & quit 2003;  CXR w/ COPD/emphysema, NAD;  PFT w/ combined mild obstructive & restrictive dis;  He denies brewathing problems & doesn't want inhalers etc...  HBP>  Controlled on diet & Clonidine (per James Parsons);  States BP is only elev when he is in pain...  Ven Insuffic>  He knows to avoid sodium, elev legs, wear support hose etc...  Hyperlipid>  On Simva40 but w/ suboptimal medication & diet compliance;  Asked to take med daily...  DM>  On Metformin, Glimep, Januvia as above;  Compliance has been poor & asked to take regularly so we can assess it's efficacy; A1c = 9.8 & we will maximize Glim4Bid before we start insulin.  Obesity>  Unable to exercise he says due to chronic back pain;  We reviewed wt reducing diet strategies ==> weight is down 22# "on diet" he says...  GI> Divertics, Polyps, Constip>  He is overdue for f/u colonoscopy & we will refer chart to GI...  GU- Hematuria>  eval by James James reported neg; we will Rx w/ Flomax0.4.Marland KitchenMarland Kitchen  DJD/ LBP/ RSD/ Chronic Pain Syndrome>  This is his main problem that consumes his life 24/7; Managed & followed by Pain Management James Parsons etal... S/p right TKR by James James 11/13...     Plan:     Patient's Medications  New Prescriptions   TAMSULOSIN (FLOMAX) 0.4 MG CAPS    Take 1 capsule (0.4 mg total) by mouth daily after supper.  Previous Medications   CELECOXIB (CELEBREX) 200 MG CAPSULE    Take 200 mg by mouth 2 (two) times daily.     CYCLOBENZAPRINE (FLEXERIL) 10 MG TABLET    Take 10 mg by mouth 2 (two) times daily as needed. Muscle  spasm.   CYMBALTA 60 MG CAPSULE    Take 60 mg by mouth daily.    ENDOCET 10-325 MG PER TABLET    Take 1 tablet by mouth 3 (three) times daily as needed. pain    FENTANYL 0.05 MG/ML SOLN 5,000 MCG    Inject into the skin continuous. Patient will bring dosage instructions with him. Daily dose 0.84mg    LIDOCAINE (LIDODERM) 5 %    Place 1 patch onto the skin daily as needed. Pain. Remove & Discard patch within 12 hours   LYRICA 150 MG CAPSULE    Take 150 mg by mouth 2 (two) times daily.    OXYCODONE (OXY IR/ROXICODONE) 5 MG IMMEDIATE RELEASE TABLET    Take 1-2 tablets (5-10 mg total) by mouth every 3 (three) hours as needed for pain (breakthrough pain post-op).   TRAMADOL (ULTRAM) 50 MG TABLET    Take 50 mg by mouth 3 (three) times daily as needed. pain  Modified Medications   Modified Medication Previous Medication   CLONIDINE (CATAPRES) 0.1 MG TABLET cloNIDine (CATAPRES) 0.1 MG tablet      Take 1 tablet (0.1 mg total) by mouth 2 (two) times daily.    Take 1 tablet (0.1 mg total) by mouth 2 (two) times daily.   GLIMEPIRIDE (AMARYL) 4 MG TABLET glimepiride (AMARYL) 4 MG tablet      Take 1 tablet (4 mg total) by mouth daily before breakfast.    Take 4 mg by mouth daily before breakfast.   METFORMIN (GLUCOPHAGE) 500 MG TABLET metFORMIN (GLUCOPHAGE) 500 MG tablet      TAKE 2 TABLETS BY MOUTH TWO TIMES DAILY    TAKE 2 TABLETS BY MOUTH TWO TIMES DAILY   SIMVASTATIN (ZOCOR) 40 MG TABLET simvastatin (ZOCOR) 40 MG tablet      TAKE 1 TABLET (40 MG TOTAL) BY MOUTH AT BEDTIME.    TAKE 1 TABLET (40 MG TOTAL) BY MOUTH AT BEDTIME.   SITAGLIPTIN (JANUVIA) 100 MG TABLET sitaGLIPtin (JANUVIA) 100 MG tablet      TAKE 1 TABLET (100 MG TOTAL) BY MOUTH DAILY.    TAKE 1 TABLET (100 MG TOTAL) BY MOUTH DAILY.  Discontinued Medications   No medications on file

## 2012-11-02 NOTE — Patient Instructions (Addendum)
Today we updated your med list in our EPIC system...    Continue your current medications the same...  We decided to add-in a new medication to help your urination> FLOMAX (Tamsulosin) 0.4mg  one tab at bedtime...  Today we did your follow up blood work...    We will contact you w/ the results when available...   Keep up the great job w/ your exercise program & weight loss...  Call for any questions...  Let's plan a follow up visit in 75mo, sooner if needed for problems.Marland KitchenMarland Kitchen

## 2012-11-03 ENCOUNTER — Other Ambulatory Visit: Payer: Self-pay | Admitting: Pulmonary Disease

## 2012-11-05 ENCOUNTER — Other Ambulatory Visit: Payer: Self-pay | Admitting: Pulmonary Disease

## 2012-11-05 ENCOUNTER — Telehealth: Payer: Self-pay | Admitting: Pulmonary Disease

## 2012-11-05 DIAGNOSIS — D126 Benign neoplasm of colon, unspecified: Secondary | ICD-10-CM

## 2012-11-05 MED ORDER — GLIMEPIRIDE 4 MG PO TABS: 4.0000 mg | ORAL_TABLET | Freq: Two times a day (BID) | ORAL | Status: DC

## 2012-11-05 NOTE — Telephone Encounter (Signed)
Spoke to pt and he wants to talk to gi to set this transferred to gi for his appt James Parsons

## 2012-11-06 ENCOUNTER — Encounter: Payer: Self-pay | Admitting: Internal Medicine

## 2012-12-25 ENCOUNTER — Ambulatory Visit (INDEPENDENT_AMBULATORY_CARE_PROVIDER_SITE_OTHER): Payer: Worker's Comp, Other unspecified

## 2012-12-25 VITALS — BP 141/84 | HR 104 | Temp 95.3°F | Resp 18 | Ht 71.14 in | Wt 239.0 lb

## 2012-12-25 NOTE — Progress Notes (Signed)
Patient here for work-related exam.  See documentation in paper chart.    Geralynn Rile, MD 12/25/2012, 2:16 PM

## 2013-01-01 ENCOUNTER — Ambulatory Visit (INDEPENDENT_AMBULATORY_CARE_PROVIDER_SITE_OTHER): Payer: Worker's Comp, Other unspecified

## 2013-01-08 ENCOUNTER — Telehealth: Payer: Self-pay | Admitting: *Deleted

## 2013-01-08 NOTE — Telephone Encounter (Signed)
Dr. Marina Goodell, I have two questions about this pt.  I was getting his chart ready for his PV tomorrow- his colon is scheduled for 01-24-13.  You saw him last in 2007.  He has a rather complicated history.  Would you want to see him in the office before his procedure?   Also, if you want him to go ahead with his PV and procedure as scheduled, would you want him to have a more extensive prep?  I noted he has daily narcotic use and hx of constipation.  Thank you, Baxter Hire

## 2013-01-11 ENCOUNTER — Ambulatory Visit (AMBULATORY_SURGERY_CENTER): Payer: Medicare PPO | Admitting: *Deleted

## 2013-01-11 VITALS — Ht 69.0 in | Wt 241.6 lb

## 2013-01-11 DIAGNOSIS — Z86010 Personal history of colonic polyps: Secondary | ICD-10-CM

## 2013-01-11 MED ORDER — MOVIPREP 100 G PO SOLR: ORAL | Status: DC

## 2013-01-11 NOTE — Progress Notes (Addendum)
No allergies to eggs or soy. No problems with anesthesia.  Pt has spinal chord stimulator left buttocks and Fentanyl pump in right side of abdomen.    Pt is not currently using inhalers

## 2013-01-13 NOTE — Telephone Encounter (Signed)
Good points. Has he had prior colonoscopy? If so, put copy of procedure on my desk for review. I suspect you did standard prep. We will contact patient if we wish to make adjustments. Thanks

## 2013-01-14 NOTE — Telephone Encounter (Signed)
Dr Marina Goodell: pt had PV appt Friday 10/3. He was  given standard MoviPrep instructions.  He is aware that he may get call from office for additional prep.  Pt has BM about every 2 to 3 days and he says it is a constipated stool and does not take any medication to help with constipation.  He denies rectal bleeding.  Pt has spinal chord stimulator and uses several meds for chronic back pain.  Also has continuous Fentanyl pump.  I have put chart on your desk for review. Last colonoscopy 2007, pathology report is in EPIC.

## 2013-01-14 NOTE — Telephone Encounter (Signed)
Spoke with pt and he is aware. 

## 2013-01-14 NOTE — Telephone Encounter (Signed)
Prior colonoscopy reports reviewed. Previous preps were good and excellent. However, given problems with constipation and chronic narcotics it might be best to have him take one bottle of magnesium citrate the morning before his procedure and then proceed with standard Movi prep. Bonita Quin, please let him know. Thanks

## 2013-01-18 ENCOUNTER — Encounter: Payer: Self-pay | Admitting: Internal Medicine

## 2013-01-24 ENCOUNTER — Ambulatory Visit (AMBULATORY_SURGERY_CENTER): Payer: Medicare PPO | Admitting: Internal Medicine

## 2013-01-24 ENCOUNTER — Encounter: Payer: Self-pay | Admitting: Internal Medicine

## 2013-01-24 VITALS — BP 143/100 | HR 92 | Temp 96.7°F | Resp 21 | Ht 69.0 in | Wt 241.0 lb

## 2013-01-24 DIAGNOSIS — Z86010 Personal history of colonic polyps: Secondary | ICD-10-CM

## 2013-01-24 LAB — GLUCOSE, CAPILLARY: Glucose-Capillary: 200 mg/dL — ABNORMAL HIGH (ref 70–99)

## 2013-01-24 MED ORDER — SODIUM CHLORIDE 0.9 % IV SOLN: 500.0000 mL | INTRAVENOUS | Status: DC

## 2013-01-24 NOTE — Patient Instructions (Signed)
YOU HAD AN ENDOSCOPIC PROCEDURE TODAY AT THE [facility]: Refer to the procedure report that was given to you for any specific questions about what was found during the examination.  If the procedure report does not answer your questions, please call your gastroenterologist to clarify.  If you requested that your care partner not be given the details of your procedure findings, then the procedure report has been included in a sealed envelope for you to review at your convenience later.  YOU SHOULD EXPECT: Some feelings of bloating in the abdomen. Passage of more gas than usual.  Walking can help get rid of the air that was put into your GI tract during the procedure and reduce the bloating. If you had a lower endoscopy (such as a colonoscopy or flexible sigmoidoscopy) you may notice spotting of blood in your stool or on the toilet paper. If you underwent a bowel prep for your procedure, then you may not have a normal bowel movement for a few days.  DIET: Your first meal following the procedure should be a light meal and then it is ok to progress to your normal diet.  A half-sandwich or bowl of soup is an example of a good first meal.  Heavy or fried foods are harder to digest and may make you feel nauseous or bloated.  Likewise meals heavy in dairy and vegetables can cause extra gas to form and this can also increase the bloating.  Drink plenty of fluids but you should avoid alcoholic beverages for 24 hours.  ACTIVITY: Your care partner should take you home directly after the procedure.  You should plan to take it easy, moving slowly for the rest of the day.  You can resume normal activity the day after the procedure however you should NOT DRIVE or use heavy machinery for 24 hours (because of the sedation medicines used during the test).    SYMPTOMS TO REPORT IMMEDIATELY: A gastroenterologist can be reached at any hour.  During normal business hours, 8:30 AM to 5:00 PM Monday through Friday,  call [phone].  After hours and on weekends, please call the GI answering service at [phone] who will take a message and have the physician on call contact you.   Following lower endoscopy (colonoscopy or flexible sigmoidoscopy):  Excessive amounts of blood in the stool  Significant tenderness or worsening of abdominal pains  Swelling of the abdomen that is new, acute  Fever of 100F or higher   FOLLOW UP: If any biopsies were taken you will be contacted by phone or by letter within the next 1-3 weeks.  Call your gastroenterologist if you have not heard about the biopsies in 3 weeks.  Our staff will call the home number listed on your records the next business day following your procedure to check on you and address any questions or concerns that you may have at that time regarding the information given to you following your procedure. This is a courtesy call and so if there is no answer at the home number and we have not heard from you through the emergency physician on call, we will assume that you have returned to your regular daily activities without incident.  SIGNATURES/CONFIDENTIALITY: You and/or your care partner have signed paperwork which will be entered into your electronic medical record.  These signatures attest to the fact that that the information above on your After Visit Summary has been reviewed and is understood.  Full responsibility of the confidentiality of   this discharge information lies with you and/or your care-partner.  Diverticulosis, high fiber diet-handouts given  Repeat colonoscopy in 10 years.   

## 2013-01-24 NOTE — Progress Notes (Signed)
I informed the CRNA that the pt has a fentanyl pump infusing now and unable to turn it off.  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 maw

## 2013-01-24 NOTE — Op Note (Signed)
 [facility] [address] [place], [address]   COLONOSCOPY PROCEDURE REPORT  Parsons: James Parsons, James Parsons  MR#: [id number] BIRTHDATE: 1945/10/18 , 66  yrs. old GENDER: Male ENDOSCOPIST: Roxy Cedar, MD REFERRED [id number] Program Recall PROCEDURE DATE:  01/24/2013 PROCEDURE:   Colonoscopy, surveillance First Screening Colonoscopy - Avg.  risk and is 50 yrs.  old or older - No.  Prior Negative Screening - Now for repeat screening. N/A  History of Adenoma - Now for follow-up colonoscopy & has been > or = to 3 yrs.  Yes hx of adenoma.  Has been 3 or more years since last colonoscopy.  Polyps Removed Today? No.  Recommend repeat exam, <10 yrs? No. 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 Index 1995 (advanced adenomas); F/U 2007 (TAs) MEDICATIONS: MAC sedation, administered by CRNA and propofol (Diprivan) 120mg  IV  DESCRIPTION OF PROCEDURE:   After James risks benefits and alternatives of James procedure were thoroughly explained, informed consent was obtained.  A digital rectal exam revealed no abnormalities of James rectum.   James LB [id number]  endoscope was introduced through James anus and advanced to James cecum, which was identified by both James appendix and ileocecal valve. No adverse events experienced.   James quality of James prep was good, using MoviPrep  James instrument was then slowly withdrawn as James colon was fully examined.      COLON FINDINGS: Moderate diverticulosis was noted James finding was in James left colon.   James colon mucosa was otherwise normal. Retroflexed views revealed internal hemorrhoids. James time to cecum=3 minutes 48 seconds.  Withdrawal time=12 minutes 19 seconds. James scope was withdrawn and James procedure completed.  COMPLICATIONS: There were no complications.  ENDOSCOPIC IMPRESSION: 1.   Moderate diverticulosis was noted in James left colon 2.   James colon mucosa was otherwise  normal  RECOMMENDATIONS: 1. Continue current colorectal surveillance recommendations with a repeat colonoscopy in 10 years.   eSigned:  Roxy Cedar, MD 01/24/2013 11:15 AM   cc: Michele Mcalpine, MD and James Parsons

## 2013-01-24 NOTE — Progress Notes (Signed)
Lidocaine-40mg IV prior to Propofol InductionPropofol given over incremental dosages 

## 2013-01-24 NOTE — Progress Notes (Addendum)
Patient did not experience any of the following events: a burn prior to discharge; a fall within the facility; wrong site/side/patient/procedure/implant event; or a hospital transfer or hospital admission upon discharge from the facility. (G8907)Patient did not have preoperative order for IV antibiotic SSI prophylaxis. [id number])  Pt has not passed gas while in recovery, pt states he does not feel like he has any, once pt was off monitor took pt to restroom to see if he could pass any air, pt states he passed liquid and air while in restroom-

## 2013-01-25 ENCOUNTER — Telehealth: Payer: Self-pay

## 2013-01-25 NOTE — Telephone Encounter (Signed)
  Follow up Call-  Call back number 01/24/2013  Post procedure Call Back phone  # [phone] hm  Permission to leave phone message Yes     Patient questions:  Do you have a fever, pain , or abdominal swelling? no Pain Score  0 *  Have you tolerated food without any problems? yes  Have you been able to return to your normal activities? yes  Do you have any questions about your discharge instructions: Diet   no Medications  no Follow up visit  no  Do you have questions or concerns about your Care? no  Actions: * If pain score is 4 or above: No action needed, pain <4.  No problems per the pt. Maw

## 2013-05-07 ENCOUNTER — Encounter: Payer: Self-pay | Admitting: Pulmonary Disease

## 2013-05-07 ENCOUNTER — Other Ambulatory Visit (INDEPENDENT_AMBULATORY_CARE_PROVIDER_SITE_OTHER): Payer: Medicare PPO

## 2013-05-07 ENCOUNTER — Ambulatory Visit (INDEPENDENT_AMBULATORY_CARE_PROVIDER_SITE_OTHER): Payer: Medicare PPO | Admitting: Pulmonary Disease

## 2013-05-07 VITALS — BP 142/74 | HR 91 | Temp 97.3°F | Ht 67.0 in | Wt 242.0 lb

## 2013-05-07 DIAGNOSIS — I872 Venous insufficiency (chronic) (peripheral): Secondary | ICD-10-CM

## 2013-05-07 DIAGNOSIS — M545 Low back pain, unspecified: Secondary | ICD-10-CM

## 2013-05-07 DIAGNOSIS — IMO0002 Reserved for concepts with insufficient information to code with codable children: Secondary | ICD-10-CM

## 2013-05-07 DIAGNOSIS — N138 Other obstructive and reflux uropathy: Secondary | ICD-10-CM

## 2013-05-07 DIAGNOSIS — M1990 Unspecified osteoarthritis, unspecified site: Secondary | ICD-10-CM

## 2013-05-07 DIAGNOSIS — N401 Enlarged prostate with lower urinary tract symptoms: Secondary | ICD-10-CM

## 2013-05-07 DIAGNOSIS — Z23 Encounter for immunization: Secondary | ICD-10-CM

## 2013-05-07 DIAGNOSIS — E669 Obesity, unspecified: Secondary | ICD-10-CM

## 2013-05-07 DIAGNOSIS — D649 Anemia, unspecified: Secondary | ICD-10-CM

## 2013-05-07 DIAGNOSIS — E1165 Type 2 diabetes mellitus with hyperglycemia: Secondary | ICD-10-CM

## 2013-05-07 DIAGNOSIS — E118 Type 2 diabetes mellitus with unspecified complications: Principal | ICD-10-CM

## 2013-05-07 DIAGNOSIS — E785 Hyperlipidemia, unspecified: Secondary | ICD-10-CM

## 2013-05-07 DIAGNOSIS — I10 Essential (primary) hypertension: Secondary | ICD-10-CM

## 2013-05-07 DIAGNOSIS — N139 Obstructive and reflux uropathy, unspecified: Secondary | ICD-10-CM

## 2013-05-07 LAB — BASIC METABOLIC PANEL
BUN: 9 mg/dL (ref 6–23)
CO2: 28 mEq/L (ref 19–32)
CREATININE: 0.7 mg/dL (ref 0.4–1.5)
Calcium: 9.4 mg/dL (ref 8.4–10.5)
Chloride: 102 mEq/L (ref 96–112)
GFR: 123.55 mL/min (ref >60.00)
GLUCOSE: 122 mg/dL — AB (ref 70–99)
POTASSIUM: 4.8 meq/L (ref 3.5–5.1)
Sodium: 137 mEq/L (ref 135–145)

## 2013-05-07 LAB — HEMOGLOBIN A1C: Hgb A1c MFr Bld: 9.1 % — ABNORMAL HIGH (ref 4.6–6.5)

## 2013-05-07 MED ORDER — CANAGLIFLOZIN 100 MG PO TABS: ORAL_TABLET | ORAL | Status: DC

## 2013-05-07 MED ORDER — TAMSULOSIN HCL 0.4 MG PO CAPS: 0.4000 mg | ORAL_CAPSULE | Freq: Every day | ORAL | Status: DC

## 2013-05-07 NOTE — Progress Notes (Signed)
Subjective:     Patient ID: James Parsons, male   DOB: [birth year], 68 y.o.   MRN: [id number]  HPI 68 y/o WM here for a follow up visit... he has multiple medical problems including COPD (former heavy smoker);  HBP;  Hyperlipidemia & DM;  Obesity;  Divertics & Polyps;  LBP, RSD, & chr pain syndrome...  ~  October 18, 2011:  32mo ROV & James Parsons remains in pain all the time every day; still sees DrRauch every month w/ spinal cord stimulator & pain pump under the skin, plus Oxycodone, Flexeril, Celebrex, Tramadol, Lyrica, Cymbalta...  Somehow he has lost 22# down to 242# today "on diet" he says; unfortunately his DM control is no better w/ A1c=10.0 and we will increase his meds further (he doesn't want insulin if it can be avoided...    We reviewed prob list, meds, xrays and labs> see below for updates>>  LABS 7/13:  FLP- at goals on Simva40 x TG=179;  Chems- ok x BS=207 & A1c=10.0.Marland KitchenMarland Kitchen  ~  April 24, 2012:  5mo ROV & James Parsons reports a lot going on over the last few months> he had right TKR by [facility] 11/13;  He reports several recent falls- ?etiology but I wonder about his numerous pain meds- he states pain incr in his right knee & lower back since then;  He continues to follow up w/ DrRauck/North at the pain clinic every month...  He developed an episode of hematuria 04/11/12 & went to the ER w/ +blood & rare wbc- treated w/ Cipro & referred to Urology, seen by DrMacDiarmid 1/14, & pt was to f/u w/ CT Abd and Cystoscopy- follow up note is pending;  We reviewed the following medical problems during today's office visit >>     COPD> ex-smoker, not on inhaled meds; he denies cough, sput, hemoptysis, ch in SOB, etc...    HBP> on Catapress 0.1mg  Bid; BP= 144/80 & he denies CP/angina, palpit, dizzy, ch in SOB, edema...    VI, Hx cellulitis> off prev antibiotics now; he had local infection from laceration w/ a fall...    Hyperlipidemia> on Simva40; last FLP 7/13 showed TChol 154, TG 179, HDL 36, LDL 82    DM> on  Metform500-2Bid, Glimep4, Januv100; labs 1/14 showed BS=247, last A1c was 7/13= 10.0    Obese> wt = 256#, up 14# & we reviewed diet, exercise, wt reduction strategies...    GI- Divertics, Polyps, Constip> he is supposed to be on Miralax & Senakot-S due to his narcotic pain meds; he is overdue for f/u colonoscopy w/ DrPerry...    GU- Hx prostatitis,episode of gross hematuria> outpt evaluation by DrMacdiarmid w/ CT Abd & Cysto- results pending (we don't have f/u notes)...    LBP, Chr pain syndrome> on MULT meds + injections & sp cord stim; followed by Hoover Browns at the Pain [facility] on Fentenyl SQ infusion, Persocet10, OxyIR5, Ultram50, Lyrica150Bid, Celebrex200Bid, Flexeril10Bid, Lidoderm... We do not have notes from the pain doctors & we rely on pt hx for the current list...    Vit D defic> last Vit D level 1/13 was 21 & he is rec to take 2000u OTC VitD daily... We reviewed prob list, meds, xrays and labs> see below for updates >> he had the 2013 flu vaccine 10/13; he also received the Pneumonia vaccine 1/13 at age 5...  LABS 1/14 via ER:  Chems- ok x BS=247 Creat=0.63;  CBC- hg=11.4;  UA- TNTC rbc...  ~  November 02, 2012:  14mo ROV &  James Parsons has lost 15# down to 239# today on diet; still not able to exercise much & walks w/ a cane due to his back & knee pain;  His CC today is a decr in urinary stream, LTOS, nocturia- we discussed trial of Flomax0.4mg Qhs...    BP= 140/90 on Clonidine0.1Bid (primarily prescribed for his RSD pain by [place]); he denies CP, palpit, ch in SOB, edema...    On Simva40 for his lipids; wt down 15# on diet & exercise; FLP today shows TChol 149, TG 163, HDL 30, LDL 86    He remains on Metform500-2Bid, Glimep4, Januv100; BS=255, A1c=9.8 despite the wt reduction; Rec- continue diet/ exerc and incr Glimep4mg Bid, next step is insulin...    His main prob= LBP, chr pain syndrome, DJD; followed by DrRauch pain mamagement on MULT meds> spinal cord stim, Fentenyl SQ infusion, OxyIR,  Endocet, Celebrex, Ultram, Lyrica, Lidoderm, Flexeril, Cymbalta... We reviewed prob list, meds, xrays and labs> see below for updates >>   LABS 7/14:  FLP- Chol ok on Simva40 but elevTG;  Chems- ok x BS=255 A1c=9.8;  CBC- wnl;  TSH=2.07...  ~  May 07, 2013:  20mo ROV & James Parsons is here w/ his wife today & indicates that he "took a turn for the worse" in Dec w/ incr pain & he tells me that workman's comp is re-evaluationg (DrNorth & DrRauch are in charge); wife is concerned about pt's memory loss, odd behaviors, & doesn't think it's related to his chr pain meds from the pain clinic=> I rec referral to Neurology for formal neurological evaluation given her level of concern...    COPD> ex-smoker, not on inhaled meds; he denies cough, sput, hemoptysis, ch in SOB, etc...    HBP> on Catapress 0.1mg  Bid; BP= 142/74 & he denies CP/angina, palpit, dizzy, ch in SOB, edema...    VI, Hx cellulitis> he had local infection from laceration from a fall; he knows to avoid salt, elev legs, wear support hose...    Hyperlipidemia> on Simva40; last FLP 7/14 showed TChol 149, TG 163, HDL 30, LDL 86; we reviewed diet, exercise & wt reduction strategies...    DM> on Metform500-2Bid, Glimep4Bid, Januv100; labs 1/15 show BS=122 A1c=9.1 & we are going to add INKOVANA100mg /d...    Obese> wt = 242#, BMI=38 & we reviewed diet, exercise, & he understands the need to lose the weight.    GI- Divertics, Polyps, Constip> he is supposed to be on Miralax & Senakot-S due to his narcotic pain meds; he had f/u colonoscopy w/ DrPerry 10/14 showing mod divertics, no lesions seen, f/u planned 20yrs...    GU- Hx prostatitis,episode of gross hematuria> outpt evaluation by DrMacdiarmid w/ CT Abd (neg) & Cysto (neg) as reported 1/14 note from Urology; no recurrent bleeding.    LBP, Chr pain syndrome> on MULT meds + injections & sp cord stim; followed by Hoover Browns at the Pain Clinic on Fentenyl SQ infusion, Percocet10, OxyIR5, Ultram50,  Lyrica150Bid, Celebrex200Bid, Flexeril10Bid, Lidoderm... We do not have notes from the pain doctors & we rely on pt hx for the current list... He also sees DrJJenkins and DrRowan...    Vit D defic> last Vit D level 1/13 was 21 & he is rec to take 2000u OTC VitD daily... We reviewed prob list, meds, xrays and labs> see below for updates >> he was given Rx for Shingles vaccine...   LABS 1/15:  Chems- ok x BS=122 A1c=9.1 on 3 meds and we added Inkovana100 to start.Marland KitchenMarland Kitchen  Problem List:   COPD (ZOX-096) - former 3ppd smoker, quit 2003... He states "my breathing is ok" & denies cough, phlegm, change in SOB, etc; he is sedentary due to chronic back pain... 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 ~  CXR 1/13 showed low lung volumes, mild pleural thickening (extrapleural fat), lungs clear, NAD... ~  PFT 1/13 showed FVC=2.65 (59%), FEV1=1.89 (54%), %1sec=71, mid-flows=35% pred; c/w mild obstructive & superimposed restrictive defect... ~  CXR 11/13 showed mild cardiomeg, clear lungs, neurostim electrodes seen, mild scoliosis & degen changes...  HYPERTENSION, BORDERLINE (ICD-401.9) - his BP has been elev intermittently in the past, but he states just when he is in pain... DrRauck has him on CLONIDINE 0.1mg  Bid which obviously also helps his pressure... ~  5/11:  BP= 116/70 & he denies HA, visual changes, CP, palipit, syncope, ch in dyspnea, edema, etc... ~  11/11:  BP= 160/90 but he is having considerable pain recently he says & he doesn't want additional meds. ~  1/13:  BP= 132/88 & he denies CP, palpit, ch in SOB, syncope, edema, etc.. ~  7/13:  BP= 142/86 & he remains stable from the CV standpoint, still too sedentary due to his back problems... ~  11/13:  EKG showed NSR, rate93, wnl, NAD... ~  1/14: on Catapress 0.1mg  Bid; BP= 144/80 & he denies CP/angina, palpit, dizzy, ch in SOB, edema... ~  7/14: BP= 140/90 on Clonidine0.1Bid (primarily prescribed for his RSD pain  by [place]); he denies CP, palpit, ch in SOB, edema. ~  1/15: on Catapress 0.1mg  Bid; BP= 142/74 & he denies CP/angina, palpit, dizzy, ch in SOB, edema.  VENOUS INSUFFICIENCY (ICD-459.81) - hx chr venous insuffic and cellulitis in the past; no recent soft tissue infections; he follows a low sodium diet, elevates legs, wears support hose when necessary...  HYPERLIPIDEMIA (ICD-272.4) - supposed to be on SIMVASTATIN 40mg /d, but he has been filling this irregularly (prev on Vytorin 10-40 but he is very erratic at filling that); we discussed low chol/ low fat diet, incr exercise, get weight down! ~  FLP 5/08 showed TChol 190, TG 164, HDL 36, LDL 121... ?taking Vytor10-40? ~  FLP 6/10 showed TChol 224, TG 183, HDL 31, LDL 176... rec> Rx Simva40. ~  [place] 11/10 on Simva40 showed TChol 159, TG 94, HDL 43, LDL 98... continue same. ~  pt stopped filling his perscription for Simva40 ~1/11... ~  FLP 5/11 showed TChol 240, TG 205, HDL 35, LDL 178... needs med> restart Simva40 & stay on it! ~  FLP 11/11 showed TChol 158, TG 181, HDL 32, LDL 90 ~  FLP 1/13 ?on Simva40 showed TChol 186, TG 99, HDL 48, LDL 119... rec to take the Simva40 every night. ~  [place] 7/13 on Simva40 showed TChol 154, TG 179, HDL 36, LDL 82... Needs better low fat diet & wt reduction. ~  FLP 7/14 on Simva40 showed TChol 149, TG 163, HDL 30, LDL 86   DIABETES MELLITUS (ICD-250.00) >> hx of intermittent dosing & med noncompliance in the past... ~  labs 2/08 showed BS= 107, A1c= 6.4... On Metformin500Bid. ~  labs 5/08 showed BS= 99, A1c= 6.2.Marland KitchenMarland Kitchen Continue same. ~  labs 6/10 showed BS= 127, A1c= 6.6.[name]. rec> take med regularly! ~  labs 11/10 showed BS= 145, A1c= 7.4.[name]. rec> add Glimep 1mg Qam (only took it for 2-59mo).  ~  labs 5/11 showed BS= 118, A1c= 6.8.[name]. if he can lose weight he can avoid more  meds... ~  labs 11/11 showed BS= 385, A1c= 10.6.Marland KitchenMarland Kitchen ?what happened? rec> incr Metform500-2Bid, Add Glimep2mg /d. ~  Labs 1/13 showed BS= 137, A1c= 7.7.Marland KitchenMarland Kitchen  Pharm confirms poor med compliance; asked to take meds regularly so we can assess efficacy. ~  Labs 7/13 on Metform500-2Bid+Glim2 showed BS= 207, A1c= 10.0.Marland KitchenMarland Kitchen Rec- incr Glim4mg Qam & add JANUVIA 100mg Qpm... ~  Labs 1/14 on 3Meds showed BS=247... ~  7/14: He remains on Metform500-2Bid, Glimep4, Januv100; BS=255, A1c=9.8 despite the wt reduction; Rec- continue diet/ exerc and incr Glimep4mg Bid, next step is insulin.  ~  9/14: he had Optometry eval by DrYoakum> no sign of diabetic retinopathy... ~  1/15: on Metform500-2Bid, Glimep4Bid, Januv100; labs 1/15 show BS=122 A1c=9.1 & we are going to add INKOVANA100mg /d...  OBESITY (ICD-278.00) - he is not dieting/ exercising/ or trying to lose weight in any fashion!!!  We discussed weight reducing diet strategies and exercise program that he should be able to perform. ~  last weight <230# was 1995 @ 226#... 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 ~  last colonoscopy 1/07 by DrPerry showed divertics, 22mm polyp (not retrieved)... f/u planned 53yrs. ~  1/13 & 7/14:  He is overdue for GI follow up & we will send referral/ reminder to DrPerry's office... ~  he is supposed to be on Miralax & Senakot-S due to his narcotic pain meds; he had f/u colonoscopy w/ DrPerry 10/14 showing mod divertics, no lesions seen, f/u planned 45yrs.  Hx of PROSTATITIS (ICD-601.0) - hx acute prostatitis treated in the 1990's by DrPeterson... HEMATURIA >> episode of gross hematuria 1/14 treated w/ antibiotics & referred to Urology- seen by DrMacDiarmid &  w/u in progress... ~  labs 6/10 showed PSA= 0.48 ~  labs 5/11 showed PSA= 0.28 ~  11/11: notes some irritation on penis- Rx Lotrisone cream trial. ~  Labs 1/13 showed PSA= 0.29 ~  1/14: Episode gross hematuria> eval by DrMacDiarmid: CT is reported normal; Cysto was neg x enlarged prostate; given sample of Alpha blocker- pt didn't follow up... ~  7/14: His CC today is a decr in urinary stream, LTOS, nocturia- we discussed trial of Flomax0.4mg Qhs.  OSTEOARTHRITIS >> s/p right TKR 11/13 by DrRowan... 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 followed by Dicie Beam al at the [facility]: he has a spinal cord stimulator, and subcut pain pump w/ Fentanyl (reaction to MS)...  ~  2/04:  eval by [facility] w/ HNP L3-4 right w/ spinal stenosis, lateral recessed stenosis- had microdiscectomy & decompression. ~  2/08:  Myelogram by DrJJenkins w/ multilevel cervical DDD & stenosis- s/p ant cerv discectomy & fusion w/ plating at C3-4. ~  6/10: current meds from [place] includes--- Percocet 10mg - Tid; Tramadol 50mg - 1 to 2 Qid; Celebrex 200mg Bid; Lidoderm patches; Lyrica 150mg Bid; Cymbalta 60mg /d; Flexeril 10mg Tid; Clonidine0.1mg - 1.2 Bid for RSD... ~  11/10: pt  reports Myelogram & CT scan showed 5 discs & spinal stenosis... he is to see Neurosurg soon. ~  5/11:  he reports monthly OV's w/ Pain Management & continues on 8 meds listed... ~  Imaging data in Epic showed CT Myelogram CSpine 3/12 by DrJJenkins> prev fusion C3-4 is satis, progressive spondylosis & facet degen at C2-3, unchanged spondy & DDD at C4-5, mild sp stenosis at several levels. ~  1/13:  He continues on same pain med regimen from [place] etal> we do not have notes from Pain management or Neurosurg... ~  11/13:  S/p right TKR by DrRowan... ~  on MULT meds + injections & sp cord stim; followed by Hoover Browns at the [facility] on  Fentenyl SQ infusion, Percocet10, OxyIR5, Ultram50, Lyrica150Bid, Celebrex200Bid, Flexeril10Bid, Lidoderm... We do not have notes from the pain doctors & we rely on pt hx for the current list... He also sees DrJJenkins and DrRowan...  VITAMIN D DEFICIENCT >> rec to take OTC Vit D supplement ~2000u daily... ~  Labs 1/13 showed Vit D level = 21... rec to start OTC Vit D supplement ~2000u daily...  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   Past Surgical History  Procedure Laterality Date  . Back surgery  [id number]  . Morphine pump  2009    due to reaction to morphine  . Excision of lipoma from right olecranon area  11/2000    Dr. Rise [name]  . Microdiscectomy and decompression  05/2002    Dr. Tonita Cong  . C3-4 anterior cervical discectomy and fusion with plating at c3-4  05/2006    Dr. Arnoldo Morale  . Spinal cord stimulator implanted      for pain per Dr. Maryruth Eve  . Subcut pain pump implanted    . Pain pump implantation      with fentanyl  . Tonsillectomy    . Colonoscopy w/ polypectomy    . Knee arthroscopy      right knee  . 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 Total knee arthroplasty  02/29/2012    Procedure: TOTAL KNEE ARTHROPLASTY;  Surgeon: Kerin Salen, MD;  Location: [facility];  Service: Orthopedics;  Laterality: Right;    Outpatient Encounter Prescriptions as of 05/07/2013  Medication Sig  . celecoxib (CELEBREX) 200 MG capsule Take 200 mg by mouth 2 (two) times daily.    . cloNIDine (CATAPRES) 0.1 MG tablet Take 1 tablet (0.1 mg total) by mouth 2 (two) times daily.  . cyclobenzaprine (FLEXERIL) 10 MG tablet Take 10 mg by mouth 2 (two) times daily as needed. Muscle spasm.  . CYMBALTA 60 MG capsule Take 60 mg by mouth daily.   . ENDOCET 10-325 MG per tablet Take 1 tablet by mouth 3 (three) times daily as needed. pain  . fentaNYL 0.05 MG/ML SOLN  5,000 mcg Inject into the skin continuous. Patient will bring dosage instructions with him. Daily dose 0.84mg   . glimepiride (AMARYL) 4 MG tablet Take 1 tablet (4 mg total) by mouth 2 (two) times daily.  [name] lidocaine (LIDODERM) 5 % Place 1 patch onto the skin daily as needed. Pain. Remove & Discard patch within 12 hours  . LYRICA 150 MG capsule Take 150 mg by mouth 2 (two) times daily.   . metFORMIN (GLUCOPHAGE) 500 MG tablet TAKE 2 TABLETS BY MOUTH TWO TIMES DAILY  . oxyCODONE (OXY  IR/ROXICODONE) 5 MG immediate release tablet Take 1-2 tablets (5-10 mg total) by mouth every 3 (three) hours as needed for pain (breakthrough pain post-op).  . simvastatin (ZOCOR) 40 MG tablet TAKE 1 TABLET (40 MG TOTAL) BY MOUTH AT BEDTIME.  . sitaGLIPtin (JANUVIA) 100 MG tablet TAKE 1 TABLET (100 MG TOTAL) BY MOUTH DAILY.  . tamsulosin (FLOMAX) 0.4 MG CAPS Take 1 capsule (0.4 mg total) by mouth daily after supper.  . traMADol (ULTRAM) 50 MG tablet Take 50 mg by mouth 3 (three) times daily as needed. pain    Allergies  Allergen Reactions  . Codeine Phosphate     REACTION: itching  . Morphine And Related     hallucinations    Current Medications, Allergies, Past Medical History, Past Surgical History, Family History, and Social History were reviewed in [employer] record.    Review of Systems         See HPI - .all other systems neg except as noted... The patient complains of dyspnea on exertion, peripheral edema, muscle weakness, and difficulty walking due to chr LBP (has cane).  The patient denies anorexia, fever, weight loss, weight gain, vision loss, decreased hearing, hoarseness, chest pain, syncope, prolonged cough, headaches, hemoptysis, abdominal pain, melena, hematochezia, severe indigestion/heartburn, hematuria, incontinence, suspicious skin lesions, transient blindness, depression, unusual weight change, abnormal bleeding, enlarged lymph nodes, and angioedema.     Objective:    Physical Exam     WD, Obese, 68 y/o WM in NAD... GENERAL:  Alert & oriented; pleasant & cooperative. HEENT:  [place]/AT, EOM-wnl, PERRLA, EACs-clear, TMs-wnl, NOSE-clear, THROAT-clear & wnl. NECK:  Supple w/ decrROM & ant cerv scar; no JVD; normal carotid impulses w/o bruits; no thyromegaly or nodules palpated; no lymphadenopathy. CHEST:  Clear to P & A; without wheezes/ rales/ or rhonchi heard... HEART:  Regular Rhythm; without murmurs/ rubs/ or gallops detected... ABDOMEN:  Obese, soft & nontender; decr bowel sounds; no organomegaly or masses palpated... EXT: s/p right TKR, mod arthritic changes; no varicose veins/ +venous insuffic/ tr edema. NEURO:  CN's intact; motor testing normal (lim by pain); sensory testing normal; abn gait & balance fair... Ambulates w/ cane, scar of prev Lumbar surg, & has implanted sp cord stimulator. DERM:  Mod lipoma in right antecubital area...  RADIOLOGY DATA:  Reviewed in the EPIC EMR & discussed w/ the patient...  LABORATORY DATA:  Reviewed in the EPIC EMR & discussed w/ the patient...   Assessment:      COPD>  Former 3ppd smoker & quit 2003;  CXR w/ COPD/emphysema, NAD;  PFT w/ combined mild obstructive & restrictive dis;  He denies brewathing problems & doesn't want inhalers etc...  HBP>  Controlled on diet & Clonidine (per DrRauch);  States BP is only elev when he is in pain...  Ven Insuffic>  He knows to avoid sodium, elev legs, wear support hose etc...  Hyperlipid>  On Simva40 but w/ suboptimal medication & diet compliance;  Asked to take med daily...  DM>  On Metformin, Glimep, Januvia as above;  Compliance has been poor & asked to take regularly so we can assess it's efficacy; A1c = 9.1 & we are adding Inkovana as he does not want insulin...  Obesity>  Unable to exercise he says due to chronic back pain;  We reviewed wt reducing diet strategies ==> weight is down 22# "on diet" he says...  GI> Divertics, Polyps, Constip>  He is overdue for f/u  colonoscopy & we will refer chart to  GI...  GU- Hematuria>  eval by drMacDiarmid reported neg; we will Rx w/ Flomax0.4.Marland KitchenMarland Kitchen  DJD/ LBP/ RSD/ Chronic Pain Syndrome>  This is his main problem that consumes his life 24/7; Managed & followed by Pain Management DrRauch etal... S/p right TKR by [facility] 11/13...     Plan:     Patient's Medications  New Prescriptions   CANAGLIFLOZIN 100 MG TABS    Take one tablet by mouth daily  Previous Medications   CELECOXIB (CELEBREX) 200 MG CAPSULE    Take 200 mg by mouth 2 (two) times daily.     CLONIDINE (CATAPRES) 0.1 MG TABLET    Take 1 tablet (0.1 mg total) by mouth 2 (two) times daily.   CYCLOBENZAPRINE (FLEXERIL) 10 MG TABLET    Take 10 mg by mouth 2 (two) times daily as needed. Muscle spasm.   CYMBALTA 60 MG CAPSULE    Take 60 mg by mouth daily.    ENDOCET 10-325 MG PER TABLET    Take 1 tablet by mouth 3 (three) times daily as needed. pain   FENTANYL 0.05 MG/ML SOLN 5,000 MCG    Inject into the skin continuous. Patient will bring dosage instructions with him. Daily dose 0.84mg    GLIMEPIRIDE (AMARYL) 4 MG TABLET    Take 1 tablet (4 mg total) by mouth 2 (two) times daily.   LIDOCAINE (LIDODERM) 5 %    Place 1 patch onto the skin daily as needed. Pain. Remove & Discard patch within 12 hours   LYRICA 150 MG CAPSULE    Take 150 mg by mouth 2 (two) times daily.    METFORMIN (GLUCOPHAGE) 500 MG TABLET    TAKE 2 TABLETS BY MOUTH TWO TIMES DAILY   OXYCODONE (OXY IR/ROXICODONE) 5 MG IMMEDIATE RELEASE TABLET    Take 1-2 tablets (5-10 mg total) by mouth every 3 (three) hours as needed for pain (breakthrough pain post-op).   SIMVASTATIN (ZOCOR) 40 MG TABLET    TAKE 1 TABLET (40 MG TOTAL) BY MOUTH AT BEDTIME.   SITAGLIPTIN (JANUVIA) 100 MG TABLET    TAKE 1 TABLET (100 MG TOTAL) BY MOUTH DAILY.   TRAMADOL (ULTRAM) 50 MG TABLET    Take 50 mg by mouth 3 (three) times daily as needed. pain  Modified Medications   Modified Medication Previous Medication   TAMSULOSIN  (FLOMAX) 0.4 MG CAPS CAPSULE tamsulosin (FLOMAX) 0.4 MG CAPS      Take 1 capsule (0.4 mg total) by mouth daily after supper.    Take 1 capsule (0.4 mg total) by mouth daily after supper.  Discontinued Medications   No medications on file

## 2013-05-07 NOTE — Patient Instructions (Signed)
Today we updated your med list in our EPIC system...    Continue your current medications the same...  We decided to add [facility] 100mg  take one tab each AM...  Take the Januvia w/ dinner in the eve...  Today we did your follow up DM labs...    We will contact you w/ the results when available...   Let's plan a follow up visit in 6-8weeks, sooner if needed for problems.Marland KitchenMarland Kitchen

## 2013-06-18 ENCOUNTER — Encounter: Payer: Self-pay | Admitting: Pulmonary Disease

## 2013-06-18 ENCOUNTER — Ambulatory Visit (INDEPENDENT_AMBULATORY_CARE_PROVIDER_SITE_OTHER): Payer: Medicare PPO | Admitting: Pulmonary Disease

## 2013-06-18 ENCOUNTER — Other Ambulatory Visit (INDEPENDENT_AMBULATORY_CARE_PROVIDER_SITE_OTHER): Payer: Medicare PPO

## 2013-06-18 VITALS — BP 148/80 | HR 88 | Temp 97.0°F | Ht 67.0 in | Wt 233.6 lb

## 2013-06-18 DIAGNOSIS — E1165 Type 2 diabetes mellitus with hyperglycemia: Secondary | ICD-10-CM

## 2013-06-18 DIAGNOSIS — IMO0002 Reserved for concepts with insufficient information to code with codable children: Secondary | ICD-10-CM

## 2013-06-18 DIAGNOSIS — M545 Low back pain, unspecified: Secondary | ICD-10-CM

## 2013-06-18 DIAGNOSIS — E118 Type 2 diabetes mellitus with unspecified complications: Principal | ICD-10-CM

## 2013-06-18 DIAGNOSIS — E669 Obesity, unspecified: Secondary | ICD-10-CM

## 2013-06-18 DIAGNOSIS — M1990 Unspecified osteoarthritis, unspecified site: Secondary | ICD-10-CM

## 2013-06-18 DIAGNOSIS — I10 Essential (primary) hypertension: Secondary | ICD-10-CM

## 2013-06-18 DIAGNOSIS — G894 Chronic pain syndrome: Secondary | ICD-10-CM

## 2013-06-18 DIAGNOSIS — E785 Hyperlipidemia, unspecified: Secondary | ICD-10-CM

## 2013-06-18 DIAGNOSIS — I872 Venous insufficiency (chronic) (peripheral): Secondary | ICD-10-CM

## 2013-06-18 LAB — BASIC METABOLIC PANEL
BUN: 9 mg/dL (ref 6–23)
CALCIUM: 9.5 mg/dL (ref 8.4–10.5)
CO2: 28 mEq/L (ref 19–32)
CREATININE: 0.7 mg/dL (ref 0.4–1.5)
Chloride: 100 mEq/L (ref 96–112)
GFR: 117.51 mL/min (ref >60.00)
GLUCOSE: 131 mg/dL — AB (ref 70–99)
Potassium: 4.3 mEq/L (ref 3.5–5.1)
Sodium: 136 mEq/L (ref 135–145)

## 2013-06-18 NOTE — Patient Instructions (Signed)
Today we updated your med list in our EPIC system...    Continue your current medications the same...  Today we rechecked your Metyabolic panel on the new Inkovana for the last 6-8weeks...    We will contact you w/ the results when available...   We will send copies of your records to [facility] for your appt in June...  Call for any questions or if we can be of service in any way.Marland KitchenMarland Kitchen

## 2013-07-16 ENCOUNTER — Ambulatory Visit (INDEPENDENT_AMBULATORY_CARE_PROVIDER_SITE_OTHER): Payer: Worker's Comp, Other unspecified

## 2013-07-16 VITALS — BP 155/98 | HR 91 | Temp 96.0°F | Resp 16 | Ht 71.22 in | Wt 242.3 lb

## 2013-07-16 DIAGNOSIS — IMO0002 Reserved for concepts with insufficient information to code with codable children: Secondary | ICD-10-CM

## 2013-07-16 DIAGNOSIS — Z0000 Encounter for general adult medical examination without abnormal findings: Secondary | ICD-10-CM

## 2013-07-16 NOTE — Progress Notes (Signed)
Patient here for work-related exam.  See documentation in paper chart.    Geralynn RileAnna Rudy Domek, MD 07/16/2013, 13:29

## 2013-07-16 NOTE — Progress Notes (Deleted)
Patient here for work-related exam.  See documentation in paper chart.    Jared RileAnna Tumeka Chimenti, MD 07/16/2013, 13:28

## 2013-07-16 NOTE — Addendum Note (Signed)
Addended by: Geralynn RileALLEN, Jaedin Trumbo on: 07/16/2013 01:29 PM     Modules accepted: Level of Service, SmartSet

## 2013-08-27 ENCOUNTER — Other Ambulatory Visit: Payer: Self-pay | Admitting: Specialist

## 2013-08-27 DIAGNOSIS — M545 Low back pain, unspecified: Secondary | ICD-10-CM

## 2013-09-04 ENCOUNTER — Ambulatory Visit
Admission: RE | Admit: 2013-09-04 | Discharge: 2013-09-04 | Disposition: A | Discharge status: Home / Self Care | Payer: Worker's Compensation | Source: Ambulatory Visit | Attending: Specialist | Admitting: Specialist

## 2013-09-04 VITALS — BP 107/58 | HR 82

## 2013-09-04 DIAGNOSIS — M545 Low back pain, unspecified: Secondary | ICD-10-CM

## 2013-09-04 MED ORDER — IOHEXOL 180 MG/ML  SOLN
18.0000 mL | Freq: Once | INTRAMUSCULAR | Status: AC | PRN
  Administered 2013-09-04: 12 mL via INTRATHECAL

## 2013-09-04 MED ORDER — DIAZEPAM 5 MG PO TABS
10.0000 mg | ORAL_TABLET | Freq: Once | ORAL | Status: AC
  Administered 2013-09-04: 10 mg via ORAL

## 2013-09-04 NOTE — Progress Notes (Signed)
Patient states he has been off Cymbalta and Tramadol for at least the past 48 hours.  jkl

## 2013-09-04 NOTE — Discharge Instructions (Signed)
My[facility] Discharge Instructions  1. Go home and rest quietly for the next 24 hours.  It is important to lie flat for the next 24 hours.  Get up only to go to the restroom.  You may lie in the bed or on a couch on your back, your stomach, your left side or your right side.  You may have one pillow under your head.  You may have pillows between your knees while you are on your side or under your knees while you are on your back.  2. DO NOT drive today.  Recline the seat as far back as it will go, while still wearing your seat belt, on the way home.  3. You may get up to go to the bathroom as needed.  You may sit up for 10 minutes to eat.  You may resume your normal diet and medications unless otherwise indicated.  Drink plenty of extra fluids today and tomorrow.  4. The incidence of a spinal headache with nausea and/or vomiting is about 5% (one in 20 patients).  If you develop a headache, lie flat and drink plenty of fluids until the headache goes away.  Caffeinated beverages may be helpful.  If you develop severe nausea and vomiting or a headache that does not go away with flat bed rest, call [phone].  5. You may resume normal activities after your 24 hours of bed rest is over; however, do not exert yourself strongly or do any heavy lifting tomorrow.  6. Call your physician for a follow-up appointment.   You may resume Cymbalta and Tramadol on Thursday, Sep 05, 2013 after 11:00a.m.

## 2013-09-18 ENCOUNTER — Encounter: Payer: Self-pay | Admitting: Internal Medicine

## 2013-09-18 ENCOUNTER — Ambulatory Visit (INDEPENDENT_AMBULATORY_CARE_PROVIDER_SITE_OTHER): Payer: Medicare PPO | Admitting: Internal Medicine

## 2013-09-18 VITALS — BP 132/80 | HR 88 | Temp 98.1°F | Resp 18 | Ht 67.75 in | Wt 218.4 lb

## 2013-09-18 DIAGNOSIS — E118 Type 2 diabetes mellitus with unspecified complications: Secondary | ICD-10-CM

## 2013-09-18 DIAGNOSIS — IMO0002 Reserved for concepts with insufficient information to code with codable children: Secondary | ICD-10-CM

## 2013-09-18 DIAGNOSIS — I10 Essential (primary) hypertension: Secondary | ICD-10-CM

## 2013-09-18 DIAGNOSIS — E1165 Type 2 diabetes mellitus with hyperglycemia: Secondary | ICD-10-CM

## 2013-09-18 DIAGNOSIS — E559 Vitamin D deficiency, unspecified: Secondary | ICD-10-CM

## 2013-09-18 DIAGNOSIS — E785 Hyperlipidemia, unspecified: Secondary | ICD-10-CM

## 2013-09-18 DIAGNOSIS — Z79899 Other long term (current) drug therapy: Secondary | ICD-10-CM

## 2013-09-18 LAB — CBC WITH DIFFERENTIAL/PLATELET
BASOS ABS: 0 10*3/uL (ref 0.0–0.1)
Basophils Relative: 0 % (ref 0–1)
EOS ABS: 0.2 10*3/uL (ref 0.0–0.7)
Eosinophils Relative: 2 % (ref 0–5)
HCT: 40.7 % (ref 39.0–52.0)
Hemoglobin: 14 g/dL (ref 13.0–17.0)
Lymphocytes Relative: 29 % (ref 12–46)
Lymphs Abs: 3.5 10*3/uL (ref 0.7–4.0)
MCH: 27.9 pg (ref 26.0–34.0)
MCHC: 34.4 g/dL (ref 30.0–36.0)
MCV: 81.2 fL (ref 78.0–100.0)
Monocytes Absolute: 0.6 10*3/uL (ref 0.1–1.0)
Monocytes Relative: 5 % (ref 3–12)
NEUTROS PCT: 64 % (ref 43–77)
Neutro Abs: 7.7 10*3/uL (ref 1.7–7.7)
PLATELETS: 297 10*3/uL (ref 150–400)
RBC: 5.01 MIL/uL (ref 4.22–5.81)
RDW: 15.9 % — ABNORMAL HIGH (ref 11.5–15.5)
WBC: 12 10*3/uL — AB (ref 4.0–10.5)

## 2013-09-18 NOTE — Progress Notes (Signed)
Patient ID: James Parsons, male   DOB: [birth year], 68 y.o.   MRN: [id number]   New Patient Comprehensive Examination  This very nice 68 y.o.MWM presents for New Patient Comprehensive Examination..  Patient has been followed for mild labile HTN, T2_NIDDM, Hyperlipidemia, and Vitamin D Deficiency.   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 Currently he is on maintenance narcotic analgesics and relate he has had a spinal cord stimulator since 2005 and a Fentanyl pump since about 2010.   HTN predates since 2010. Patient's BP has been controlled on Clonidine low dose w/o any SE's.Today's BP: 132/80 mmHg. Patient denies any cardiac symptoms as chest pain, palpitations, shortness of breath, dizziness or ankle swelling.   Patient's hyperlipidemia is controlled with diet and medications. Patient denies myalgias or other medication SE's. Last Lipids checked in July 2014 were as below at goal.  Lab Results  Component Value Date   CHOL 149 11/02/2012   HDL 30.10* 11/02/2012   LDLCALC 86 11/02/2012   LDLDIRECT 178.6 09/08/2009   TRIG 163.0* 11/02/2012   CHOLHDL 5 11/02/2012    Patient has T2_NIDDM since about 2007 and last A1c was 9.1% in Jan 2015. Patient alleges ~50# weight loss over the last year attributed to better food choices and smaller portions.  Patient is currently on poly therapy with M, Invokana, Januvia and Glimepiride and checks his CBG's randomly about once a week. 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   Finally, patient has history of Vitamin D Deficiency of    and last vitamin D    Medication List   aspirin  81 MG chewable tablet  Chew 81 mg by mouth.     Canagliflozin 100 MG Tabs  Take one tablet by mouth daily     celecoxib 200 MG capsule  Commonly known as:  CELEBREX  Take 200 mg by mouth 2 (two) times daily.     cloNIDine 0.1 MG tablet  Commonly known as:  CATAPRES  Take 1 tablet (0.1 mg total) by mouth 2 (two) times daily.     cyclobenzaprine 10 MG tablet  Commonly known as:  FLEXERIL  Take 10 mg by mouth 2 (two) times daily as needed. Muscle spasm.     DULoxetine 60 MG capsule  Commonly known as:  CYMBALTA  60 mg.     ENDOCET 10-325 MG per tablet  Generic drug:  oxyCODONE-acetaminophen  Take 1 tablet by mouth 3 (three) times daily as needed. pain     ENDOCET 10-325 MG per tablet  Generic drug:  oxyCODONE-acetaminophen  1 tablet.     fentaNYL 0.05 MG/ML SOLN 5,000 mcg  Inject into the skin continuous. Patient will bring dosage instructions with him. Daily dose 0.84mg      lidocaine 5 %  Commonly known as:  LIDODERM  Place 1 patch onto the skin.     LYRICA 150 MG capsule  Generic drug:  pregabalin  Take 150 mg by mouth 2 (two) times daily.     pregabalin 150 MG capsule  Commonly known as:  LYRICA  150 mg.     metFORMIN  500 MG tablet  Commonly known as:  GLUCOPHAGE  Take 500 mg by mouth.     oxyCODONE 5 MG immediate release tablet  Commonly known as:  Oxy IR/ROXICODONE  Take by mouth.     simvastatin 40 MG tablet  Commonly known as:  ZOCOR  TAKE 1 TABLET (40 MG TOTAL) BY MOUTH AT BEDTIME.     sitaGLIPtin 100 MG tablet  Commonly known as:  JANUVIA  Take 100 mg by mouth.     sitaGLIPtin 100 MG tablet  Commonly known as:  JANUVIA  TAKE 1 TABLET (100 MG TOTAL) BY MOUTH DAILY.     tamsulosin 0.4 MG Caps capsule  Commonly known as:  FLOMAX  Take by mouth.     traMADol 50 MG tablet  Commonly known as:  ULTRAM  Take 50 mg by mouth 3 (three) times daily as needed. pain       Allergies  Allergen Reactions  . Codeine Itching  . Morphine And Related  Other (See Comments)    hallucinations    Past Medical History  Diagnosis Date  . Anemia   . Arthritis   . Blood transfusion   . Clotting disorder   . Diabetes mellitus   . COPD (chronic obstructive pulmonary disease)   . CHF (congestive heart failure)   . Borderline hypertension   . Venous insufficiency   . Hyperlipidemia   . Obesity   . Diverticulosis of colon   . Hx of colonic polyps   . Low back pain   . Lipoma   . RSD (reflex sympathetic dystrophy)   . Pneumonia   . Mental disorder   . Depression     hx of    Past Surgical History  Procedure Laterality Date  . Back surgery  4132,4401  . Morphine pump  2009    due to reaction to morphine  . Excision of lipoma from right olecranon area  11/2000    Dr. Rise [name]  . Microdiscectomy and decompression  05/2002    Dr. Tonita Cong  . C3-4 anterior cervical discectomy and fusion with plating at c3-4  05/2006    Dr. Arnoldo Morale  . Spinal cord stimulator implanted      for pain per Dr. Maryruth Eve  . Subcut pain pump implanted    . Pain pump implantation      with fentanyl  . Tonsillectomy    . Colonoscopy w/ polypectomy    . Knee arthroscopy      right knee  . 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 Total knee arthroplasty  02/29/2012    Procedure: TOTAL KNEE ARTHROPLASTY;  Surgeon: Kerin Salen, MD;  Location: [facility];  Service: Orthopedics;  Laterality: Right;   Family History  Problem Relation Age of Onset  . Cancer Father   . Colon cancer Neg Hx   . Esophageal cancer Neg Hx   . Stomach cancer Neg Hx   . Rectal cancer Neg Hx    History   Social History  . Marital Status: Married    Spouse Name: James Parsons    Number of Children: 2  . Years of Education: N/A   Occupational History  . retired Management consultant    Social History Main Topics  . Smoking status: Former Smoker -- 3.00 packs/day for 40 years    Types: Cigarettes    Quit date: 05/25/2002  . Smokeless tobacco: Never Used  . Alcohol Use: No   . Drug Use: No  .  Sexual Activity: No    ROS Constitutional: Denies fever, chills, weight loss/gain, headaches, insomnia, fatigue, night sweats or change in appetite. Eyes: Denies redness, blurred vision, diplopia, discharge, itchy or watery eyes.  ENT: Denies discharge, congestion, post nasal drip, epistaxis, sore throat, earache, hearing loss, dental pain, Tinnitus, Vertigo, Sinus pain or snoring.  Cardio: Denies chest pain, palpitations, irregular heartbeat, syncope, dyspnea, diaphoresis, orthopnea, PND, claudication or edema Respiratory: denies cough, dyspnea, DOE, pleurisy, hoarseness, laryngitis or wheezing.  Gastrointestinal: Denies dysphagia, heartburn, reflux, water brash, pain, cramps, nausea, vomiting, bloating, diarrhea, constipation, hematemesis, melena, hematochezia, jaundice or hemorrhoids Genitourinary: Denies dysuria, frequency, urgency, nocturia, hesitancy, discharge, hematuria or flank pain Musculoskeletal: Denies arthralgia, myalgia, stiffness, Jt. Swelling, pain, limp or strain/sprain. Skin: Denies puritis, rash, hives, warts, acne, eczema or change in skin lesion Neuro: No weakness, tremor, incoordination, spasms, paresthesia or pain Psychiatric: Denies confusion, memory loss or sensory loss Endocrine: Denies change in weight, skin, hair change, nocturia, and paresthesia, diabetic polys, visual blurring or hyper / hypo glycemic episodes.  Heme/Lymph: No excessive bleeding, bruising or enlarged lymph nodes.  Physical Exam  BP 132/80  Pulse 88  Temp 98.1 F  Resp 18  Ht 5' 7.75"   Wt 218 lb 6.4 oz   BMI 33.45 kg/m2  General Appearance: Well nourished, in no apparent distress. Eyes: PERRLA, EOMs, conjunctiva no swelling or erythema, normal fundi and vessels. Sinuses: No frontal/maxillary tenderness ENT/Mouth: EACs patent / TMs  nl. Nares clear without erythema, swelling, mucoid exudates. Oral hygiene is good. No erythema, swelling, or exudate. Tongue normal,  non-obstructing. Tonsils not swollen or erythematous. Hearing normal.  Neck: Supple, thyroid normal. No bruits, nodes or JVD. Respiratory: Respiratory effort normal.  BS equal and clear bilateral without rales, rhonci, wheezing or stridor. Cardio: Heart sounds are normal with regular rate and rhythm and no murmurs, rubs or gallops. Peripheral pulses are normal and equal bilaterally in UE and 0-1(+) in lower extremities with venous stasis pigmentation changes of Bilat distal LE's. without edema. No aortic or femoral bruits. Chest: symmetric with normal excursions and percussion.  Abdomen: rotund, soft, with bowl sounds. Nontender, no guarding, rebound, hernias, masses, or organomegaly.  Lymphatics: Non tender without lymphadenopathy.  Musculoskeletal: Full ROM all peripheral extremities, joint stability, 5/5 strength, and sl BB gait. 6" vertical scar over the Rt. Patella. Vertical lumbar scar. Skin: Warm and dry without rashes, lesions, cyanosis, clubbing or  ecchymosis.  Neuro: Cranial nerves intact, reflexes 1 (+) equal bilaterally in UE& absent in LE's. Generalized decrease in muscle power tone and bulk. No o cerebellar symptoms. Sensation intact in LLE, but decreased in a circumferential stocking distribution below the Rt knee.   Pysch: Awake and oriented X 3, normal affect, insight and judgment appropriate.   Assessment and Plan  1. Annual Screening Examination 2. Hypertension  3. Hyperlipidemia 4. T2_NIDDM 5. Vitamin D Deficiency 6. Chronic Pain Syndrome  7. CRPS 8. Obesity 9. BPH, Hx  Continue prudent diet as discussed, weight control, BP monitoring, regular exercise, and medications as discussed.  Discussed med effects and SE's. Routine screening labs and tests as requested with regular follow-up as recommended.  Recc d/c glimepiride to allow weight loss. Discussed with Pt and wife to follow Dr Fara Olden Fuhrman's book/menu's "The End Of Diabetes" ., monitor FBG qam and ROV 2 weeks.

## 2013-09-18 NOTE — Patient Instructions (Signed)
Stop Glimepiride   Monitor Fasting glucose daily  ==================================================== Hypertension As your heart beats, it forces blood through your arteries. This force is your blood pressure. If the pressure is too high, it is called hypertension (HTN) or high blood pressure. HTN is dangerous because you may have it and not know it. High blood pressure may mean that your heart has to work harder to pump blood. Your arteries may be narrow or stiff. The extra work puts you at risk for heart disease, stroke, and other problems.  Blood pressure consists of two numbers, a higher number over a lower, 110/72, for example. It is stated as "110 over 72." The ideal is below 120 for the top number (systolic) and under 80 for the bottom (diastolic). Write down your blood pressure today. You should pay close attention to your blood pressure if you have certain conditions such as:  Heart failure.  Prior heart attack.  Diabetes  Chronic kidney disease.  Prior stroke.  Multiple risk factors for heart disease. To see if you have HTN, your blood pressure should be measured while you are seated with your arm held at the level of the heart. It should be measured at least twice. A one-time elevated blood pressure reading (especially in the Emergency Department) does not mean that you need treatment. There may be conditions in which the blood pressure is different between your right and left arms. It is important to see your caregiver soon for a recheck. Most people have essential hypertension which means that there is not a specific cause. This type of high blood pressure may be lowered by changing lifestyle factors such as:  Stress.  Smoking.  Lack of exercise.  Excessive weight.  Drug/tobacco/alcohol use.  Eating less salt. Most people do not have symptoms from high blood pressure until it has caused damage to the body. Effective treatment can often prevent, delay or reduce that  damage. TREATMENT  When a cause has been identified, treatment for high blood pressure is directed at the cause. There are a large number of medications to treat HTN. These fall into several categories, and your caregiver will help you select the medicines that are best for you. Medications may have side effects. You should review side effects with your caregiver. If your blood pressure stays high after you have made lifestyle changes or started on medicines,   Your medication(s) may need to be changed.  Other problems may need to be addressed.  Be certain you understand your prescriptions, and know how and when to take your medicine.  Be sure to follow up with your caregiver within the time frame advised (usually within two weeks) to have your blood pressure rechecked and to review your medications.  If you are taking more than one medicine to lower your blood pressure, make sure you know how and at what times they should be taken. Taking two medicines at the same time can result in blood pressure that is too low. SEEK IMMEDIATE MEDICAL CARE IF:  You develop a severe headache, blurred or changing vision, or confusion.  You have unusual weakness or numbness, or a faint feeling.  You have severe chest or abdominal pain, vomiting, or breathing problems. MAKE SURE YOU:   Understand these instructions.  Will watch your condition.  Will get help right away if you are not doing well or get worse.   Diabetes and Exercise Exercising regularly is important. It is not just about losing weight. It has many health benefits,  such as:  Improving your overall fitness, flexibility, and endurance.  Increasing your bone density.  Helping with weight control.  Decreasing your body fat.  Increasing your muscle strength.  Reducing stress and tension.  Improving your overall health. People with diabetes who exercise gain additional benefits because exercise:  Reduces appetite.  Improves  the body's use of blood sugar (glucose).  Helps lower or control blood glucose.  Decreases blood pressure.  Helps control blood lipids (such as cholesterol and triglycerides).  Improves the body's use of the hormone insulin by:  Increasing the body's insulin sensitivity.  Reducing the body's insulin needs.  Decreases the risk for heart disease because exercising:  Lowers cholesterol and triglycerides levels.  Increases the levels of good cholesterol (such as high-density lipoproteins [HDL]) in the body.  Lowers blood glucose levels. YOUR ACTIVITY PLAN  Choose an activity that you enjoy and set realistic goals. Your health care provider or diabetes educator can help you make an activity plan that works for you. You can break activities into 2 or 3 sessions throughout the day. Doing so is as good as one long session. Exercise ideas include:  Taking the dog for a walk.  Taking the stairs instead of the elevator.  Dancing to your favorite song.  Doing your favorite exercise with a friend. RECOMMENDATIONS FOR EXERCISING WITH TYPE 1 OR TYPE 2 DIABETES   Check your blood glucose before exercising. If blood glucose levels are greater than 240 mg/dL, check for urine ketones. Do not exercise if ketones are present.  Avoid injecting insulin into areas of the body that are going to be exercised. For example, avoid injecting insulin into:  The arms when playing tennis.  The legs when jogging.  Keep a record of:  Food intake before and after you exercise.  Expected peak times of insulin action.  Blood glucose levels before and after you exercise.  The type and amount of exercise you have done.  Review your records with your health care provider. Your health care provider will help you to develop guidelines for adjusting food intake and insulin amounts before and after exercising.  If you take insulin or oral hypoglycemic agents, watch for signs and symptoms of hypoglycemia.  They include:  Dizziness.  Shaking.  Sweating.  Chills.  Confusion.  Drink plenty of water while you exercise to prevent dehydration or heat stroke. Body water is lost during exercise and must be replaced.  Talk to your health care provider before starting an exercise program to make sure it is safe for you. Remember, almost any type of activity is better than none.    Cholesterol Cholesterol is a white, waxy, fat-like protein needed by your body in small amounts. The liver makes all the cholesterol you need. It is carried from the liver by the blood through the blood vessels. Deposits (plaque) may build up on blood vessel walls. This makes the arteries narrower and stiffer. Plaque increases the risk for heart attack and stroke. You cannot feel your cholesterol level even if it is very high. The only way to know is by a blood test to check your lipid (fats) levels. Once you know your cholesterol levels, you should keep a record of the test results. Work with your caregiver to to keep your levels in the desired range. WHAT THE RESULTS MEAN:  Total cholesterol is a rough measure of all the cholesterol in your blood.  LDL is the so-called bad cholesterol. This is the type that deposits cholesterol  in the walls of the arteries. You want this level to be low.  HDL is the good cholesterol because it cleans the arteries and carries the LDL away. You want this level to be high.  Triglycerides are fat that the body can either burn for energy or store. High levels are closely linked to heart disease. DESIRED LEVELS:  Total cholesterol below 200.  LDL below 100 for people at risk, below 70 for very high risk.  HDL above 50 is good, above 60 is best.  Triglycerides below 150. HOW TO LOWER YOUR CHOLESTEROL:  Diet.  Choose fish or white meat chicken and [place], roasted or baked. Limit fatty cuts of red meat, fried foods, and processed meats, such as sausage and lunch meat.  Eat lots of  fresh fruits and vegetables. Choose whole grains, beans, pasta, potatoes and cereals.  Use only small amounts of olive, corn or canola oils. Avoid butter, mayonnaise, shortening or palm kernel oils. Avoid foods with trans-fats.  Use skim/nonfat milk and low-fat/nonfat yogurt and cheeses. Avoid whole milk, cream, ice cream, egg yolks and cheeses. Healthy desserts include angel food cake, ginger snaps, animal crackers, hard candy, popsicles, and low-fat/nonfat frozen yogurt. Avoid pastries, cakes, pies and cookies.  Exercise.  A regular program helps decrease LDL and raises HDL.  Helps with weight control.  Do things that increase your activity level like gardening, walking, or taking the stairs.  Medication.  May be prescribed by your caregiver to help lowering cholesterol and the risk for heart disease.  You may need medicine even if your levels are normal if you have several risk factors. HOME CARE INSTRUCTIONS   Follow your diet and exercise programs as suggested by your caregiver.  Take medications as directed.  Have blood work done when your caregiver feels it is necessary. MAKE SURE YOU:   Understand these instructions.  Will watch your condition.  Will get help right away if you are not doing well or get worse.      Vitamin D Deficiency Vitamin D is an important vitamin that your body needs. Having too little of it in your body is called a deficiency. A very bad deficiency can make your bones soft and can cause a condition called rickets.  Vitamin D is important to your body for different reasons, such as:   It helps your body absorb 2 minerals called calcium and phosphorus.  It helps make your bones healthy.  It may prevent some diseases, such as diabetes and multiple sclerosis.  It helps your muscles and heart. You can get vitamin D in several ways. It is a natural part of some foods. The vitamin is also added to some dairy products and cereals. Some people  take vitamin D supplements. Also, your body makes vitamin D when you are in the sun. It changes the sun's rays into a form of the vitamin that your body can use. CAUSES   Not eating enough foods that contain vitamin D.  Not getting enough sunlight.  Having certain digestive system diseases that make it hard to absorb vitamin D. These diseases include Crohn's disease, chronic pancreatitis, and cystic fibrosis.  Having a surgery in which part of the stomach or small intestine is removed.  Being obese. Fat cells pull vitamin D out of your blood. That means that obese people may not have enough vitamin D left in their blood and in other body tissues.  Having chronic kidney or liver disease. RISK FACTORS Risk factors are things  that make you more likely to develop a vitamin D deficiency. They include:  Being older.  Not being able to get outside very much.  Living in a nursing home.  Having had broken bones.  Having weak or thin bones (osteoporosis).  Having a disease or condition that changes how your body absorbs vitamin D.  Having dark skin.  Some medicines such as seizure medicines or steroids.  Being overweight or obese. SYMPTOMS Mild cases of vitamin D deficiency may not have any symptoms. If you have a very bad case, symptoms may include:  Bone pain.  Muscle pain.  Falling often.  Broken bones caused by a minor injury, due to osteoporosis. DIAGNOSIS A blood test is the best way to tell if you have a vitamin D deficiency. TREATMENT Vitamin D deficiency can be treated in different ways. Treatment for vitamin D deficiency depends on what is causing it. Options include:  Taking vitamin D supplements.  Taking a calcium supplement. Your caregiver will suggest what dose is best for you. HOME CARE INSTRUCTIONS  Take any supplements that your caregiver prescribes. Follow the directions carefully. Take only the suggested amount.  Have your blood tested 2 months after  you start taking supplements.  Eat foods that contain vitamin D. Healthy choices include:  Fortified dairy products, cereals, or juices. Fortified means vitamin D has been added to the food. Check the label on the package to be sure.  Fatty fish like salmon or trout.  Eggs.  Oysters.  Do not use a tanning bed.  Keep your weight at a healthy level. Lose weight if you need to.  Keep all follow-up appointments. Your caregiver will need to perform blood tests to make sure your vitamin D deficiency is going away. SEEK MEDICAL CARE IF:  You have any questions about your treatment.  You continue to have symptoms of vitamin D deficiency.  You have nausea or vomiting.  You are constipated.  You feel confused.  You have severe abdominal or back pain. MAKE SURE YOU:  Understand these instructions.  Will watch your condition.  Will get help right away if you are not doing well or get worse.

## 2013-09-19 LAB — BASIC METABOLIC PANEL WITH GFR
BUN: 10 mg/dL (ref 6–23)
CALCIUM: 10.3 mg/dL (ref 8.4–10.5)
CO2: 28 mEq/L (ref 19–32)
CREATININE: 0.65 mg/dL (ref 0.50–1.35)
Chloride: 98 mEq/L (ref 96–112)
GFR, Est African American: >89 mL/min
Glucose, Bld: 126 mg/dL — ABNORMAL HIGH (ref 70–99)
Potassium: 4.7 mEq/L (ref 3.5–5.3)
Sodium: 140 mEq/L (ref 135–145)

## 2013-09-19 LAB — HEPATIC FUNCTION PANEL
ALBUMIN: 4.3 g/dL (ref 3.5–5.2)
ALT: 23 U/L (ref 0–53)
AST: 24 U/L (ref 0–37)
Alkaline Phosphatase: 97 U/L (ref 39–117)
BILIRUBIN TOTAL: 0.5 mg/dL (ref 0.2–1.2)
Bilirubin, Direct: 0.1 mg/dL (ref 0.0–0.3)
Indirect Bilirubin: 0.4 mg/dL (ref 0.2–1.2)
Total Protein: 8 g/dL (ref 6.0–8.3)

## 2013-09-19 LAB — HEMOGLOBIN A1C
Hgb A1c MFr Bld: 8 % — ABNORMAL HIGH (ref <5.7)
Mean Plasma Glucose: 183 mg/dL — ABNORMAL HIGH (ref <117)

## 2013-09-19 LAB — TSH: TSH: 2.721 u[IU]/mL (ref 0.350–4.500)

## 2013-09-19 LAB — LIPID PANEL
CHOL/HDL RATIO: 6.1 ratio
CHOLESTEROL: 276 mg/dL — AB (ref 0–200)
HDL: 45 mg/dL (ref >39)
LDL Cholesterol: 195 mg/dL — ABNORMAL HIGH (ref 0–99)
TRIGLYCERIDES: 179 mg/dL — AB (ref <150)
VLDL: 36 mg/dL (ref 0–40)

## 2013-09-19 LAB — MAGNESIUM: MAGNESIUM: 2 mg/dL (ref 1.5–2.5)

## 2013-09-19 LAB — INSULIN, FASTING: INSULIN FASTING, SERUM: 25 u[IU]/mL (ref 3–28)

## 2013-09-19 LAB — VITAMIN D 25 HYDROXY (VIT D DEFICIENCY, FRACTURES): VIT D 25 HYDROXY: 25 ng/mL — AB (ref 30–89)

## 2013-09-20 ENCOUNTER — Other Ambulatory Visit: Payer: Self-pay | Admitting: *Deleted

## 2013-09-20 ENCOUNTER — Other Ambulatory Visit: Payer: Self-pay | Admitting: Internal Medicine

## 2013-09-20 DIAGNOSIS — E782 Mixed hyperlipidemia: Secondary | ICD-10-CM

## 2013-09-20 MED ORDER — LEVOFLOXACIN 500 MG PO TABS: 500.0000 mg | ORAL_TABLET | Freq: Every day | ORAL | Status: AC

## 2013-09-20 MED ORDER — ATORVASTATIN CALCIUM 80 MG PO TABS: ORAL_TABLET | ORAL | Status: DC

## 2013-10-08 ENCOUNTER — Ambulatory Visit (INDEPENDENT_AMBULATORY_CARE_PROVIDER_SITE_OTHER): Payer: Medicare PPO | Admitting: Internal Medicine

## 2013-10-08 ENCOUNTER — Encounter: Payer: Self-pay | Admitting: Internal Medicine

## 2013-10-08 VITALS — BP 112/64 | HR 92 | Temp 97.3°F | Resp 16 | Ht 67.75 in | Wt 222.0 lb

## 2013-10-08 DIAGNOSIS — I10 Essential (primary) hypertension: Secondary | ICD-10-CM

## 2013-10-08 DIAGNOSIS — E1165 Type 2 diabetes mellitus with hyperglycemia: Secondary | ICD-10-CM

## 2013-10-08 DIAGNOSIS — IMO0002 Reserved for concepts with insufficient information to code with codable children: Secondary | ICD-10-CM

## 2013-10-08 DIAGNOSIS — E118 Type 2 diabetes mellitus with unspecified complications: Secondary | ICD-10-CM

## 2013-10-08 NOTE — Progress Notes (Signed)
Patient ID: James Parsons, male   DOB: [birth year], 68 y.o.   MRN: [id number]   This very nice 68 y.o.male with mild labile HTN, T2_NIDDM (2007), Hyperlipidemia, and Vitamin D Deficiency presents for 2 week  follow up. 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   HTN predates since   . BP has been controlled at home. Today's BP: 112/64 mmHg. Patient denies any cardiac type chest pain, palpitations, dyspnea/orthopnea/PND, dizziness, claudication, or dependent edema.   Hyperlipidemia is not controlled with diet & meds. Patient denies myalgias or other med SE's. Last Lipids were   Lab Results  Component Value Date   CHOL 276* 09/18/2013   HDL 45 09/18/2013   LDLCALC 195* 09/18/2013   LDLDIRECT 178.6 09/08/2009   TRIG 179* 09/18/2013   CHOLHDL 6.1 09/18/2013    Also, the patient has history of T2_NIDDM  Poorly controlled with  Recent  A1c 8.0% in June 2015. At recent OV, he was advised to d/c Glimepiride.   Both he & his wife James Parsons (also a patient) allege a much stricter diet since Ov 2 weeks ago and qd-bid CBG's range 90-150 mg%. Patient denies any symptoms of reactive hypoglycemia, diabetic polys, paresthesias or visual blurring.   Further, Patient has history of Vitamin D Deficiency   and last vitamin D was 25 on September 18, 2013.  Patient supplements vitamin D without any suspected side-effects.   Medication List   aspirin 81 MG chewable tablet  Chew 81 mg by mouth.     atorvastatin 80 MG tablet  Commonly known as:  LIPITOR  Take 1/2 to 1 tablet daily or as directed for cholesterol     Canagliflozin 100 MG Tabs  Take one tablet by mouth daily     celecoxib 200 MG capsule  Commonly known as:  CELEBREX  Take 200 mg by mouth 2 (two) times daily.     cloNIDine 0.1 MG tablet  Commonly known as:  CATAPRES  Take 1 tablet (0.1 mg total) by mouth 2 (two) times daily.     cyclobenzaprine 10 MG  tablet  Commonly known as:  FLEXERIL  Take 10 mg by mouth 2 (two) times daily as needed. Muscle spasm.     DULoxetine 60 MG capsule  Commonly known as:  CYMBALTA  60 mg.     ENDOCET 10-325 MG per tablet  Generic drug:  oxyCODONE-acetaminophen  Take 1 tablet by mouth 3 (three) times daily as needed. pain     ENDOCET 10-325 MG per tablet  Generic drug:  oxyCODONE-acetaminophen  1 tablet.     fentaNYL 0.05 MG/ML SOLN 5,000 mcg  Inject into the skin continuous. Patient will bring dosage instructions with him. Daily dose 0.84mg      lidocaine 5 %  Commonly known as:  LIDODERM  Place 1 patch onto the skin.     LYRICA 150 MG capsule  Generic drug:  pregabalin  Take 150 mg by mouth 2 (two) times daily.     pregabalin 150 MG capsule  Commonly known as:  LYRICA  150 mg.     metFORMIN 500 MG tablet  Commonly known as:  GLUCOPHAGE  Take 500 mg by mouth.     sitaGLIPtin 100 MG tablet  Commonly known as:  JANUVIA  Take 100 mg by mouth.     tamsulosin 0.4 MG Caps capsule  Commonly known as:  FLOMAX  Take by mouth.     traMADol 50 MG tablet  Commonly known as:  ULTRAM  Take 50 mg by mouth 3 (three) times daily as needed. pain       Allergies  Allergen Reactions  . Codeine Itching  . Morphine And Related Other (See Comments)    hallucinations   PMHx:   Past Medical History  Diagnosis Date  . Anemia   . Arthritis   . Blood transfusion   . Clotting disorder   . Diabetes mellitus   . COPD (chronic obstructive pulmonary disease)   . CHF (congestive heart failure)   . Borderline hypertension   . Venous insufficiency   . Hyperlipidemia   . Obesity   . Diverticulosis of colon   . Hx of colonic polyps   . Low back pain   . Lipoma   . RSD (reflex sympathetic dystrophy)   . Pneumonia   . Mental disorder   . Depression     hx of    FHx:    Reviewed / unchanged  SHx:    Reviewed / unchanged  Systems Review:  In addition to the HPI above,  No Fever-chills,  No  Headache, No changes with Vision or hearing,  No problems swallowing food or Liquids,  No Chest pain or productive Cough or Shortness of Breath,  No Abdominal pain, No Nausea or Vomitting, Bowel movements are regular,  No Blood in stool or Urine,  No dysuria,  No new skin rashes or bruises,  No new joints pains-aches,  No new weakness, tingling, numbness in any extremity,  No recent weight loss,  No polyuria, polydypsia or polyphagia,  No significant Mental Stressors.  A full 10 point Review of Systems was done, except as stated above, all other Review of Systems were negative    Exam:  BP 112/64  P 92  T 97.3 F   R 16  Ht 5' 7.75"   Wt 222 lb   BMI 34.00 kg/m2  HEENT - Eac's patent. TM's Nl. EOM's full. PERRLA. NasoOroPharynx clear. Neck - supple. Nl Thyroid. Carotids 2+ & No bruits, nodes, JVD Chest - Clear equal BS w/o Rales, rhonchi, wheezes. Cor - Nl HS. RRR w/o sig MGR. PP 1(+). No edema. Abd - No palpable organomegaly, masses or tenderness. BS nl. MS- FROM w/o deformities. Muscle power, tone and bulk Nl. Gait Nl. Neuro - No obvious Cr N abnormalities. Sensory, motor and Cerebellar functions appear Nl w/o focal abnormalities. Psyche - Mental status normal & appropriate.  No delusions, ideations or obvious mood abnormalities.  Assessment and Plan:  1. Hypertension - Continue monitor blood pressure at home. Continue diet/meds same.  2. Hyperlipidemia - Continue diet/meds, exercise,& lifestyle modifications. Continue monitor periodic cholesterol/liver & renal functions   3. T2_NIDDM - Patient advised to taper his Januvia 100 mg to 1/2 tablet and if CBG's stay less than 160 mg% to d/c Januvia and continue with strict diet - striving toward weight loss and ultimately d/c'ing all diabetic meds.  4. Vitamin D Deficiency - Continue supplementation.  5. Chronic Pain Syndrome / CRPS  Recommended regular exercise, BP monitoring, weight control, and discussed med and SE's.  Recommended labs to assess and monitor clinical status. Further disposition pending results of labs.

## 2013-10-16 ENCOUNTER — Encounter (HOSPITAL_COMMUNITY): Payer: Self-pay | Admitting: Emergency Medicine

## 2013-10-16 ENCOUNTER — Emergency Department (HOSPITAL_COMMUNITY): Payer: Medicare PPO

## 2013-10-16 ENCOUNTER — Inpatient Hospital Stay (HOSPITAL_COMMUNITY)
Admission: EM | Admit: 2013-10-16 | Discharge: 2013-10-18 | DRG: 923 | Disposition: A | Discharge status: Home / Self Care | Payer: Medicare PPO | Attending: Internal Medicine | Admitting: Internal Medicine

## 2013-10-16 DIAGNOSIS — T6701XA Heatstroke and sunstroke, initial encounter: Principal | ICD-10-CM | POA: Diagnosis present

## 2013-10-16 DIAGNOSIS — D126 Benign neoplasm of colon, unspecified: Secondary | ICD-10-CM

## 2013-10-16 DIAGNOSIS — E1169 Type 2 diabetes mellitus with other specified complication: Secondary | ICD-10-CM | POA: Diagnosis present

## 2013-10-16 DIAGNOSIS — E1165 Type 2 diabetes mellitus with hyperglycemia: Secondary | ICD-10-CM | POA: Diagnosis present

## 2013-10-16 DIAGNOSIS — I872 Venous insufficiency (chronic) (peripheral): Secondary | ICD-10-CM

## 2013-10-16 DIAGNOSIS — K5730 Diverticulosis of large intestine without perforation or abscess without bleeding: Secondary | ICD-10-CM

## 2013-10-16 DIAGNOSIS — R319 Hematuria, unspecified: Secondary | ICD-10-CM

## 2013-10-16 DIAGNOSIS — W010XXA Fall on same level from slipping, tripping and stumbling without subsequent striking against object, initial encounter: Secondary | ICD-10-CM | POA: Diagnosis present

## 2013-10-16 DIAGNOSIS — E782 Mixed hyperlipidemia: Secondary | ICD-10-CM | POA: Diagnosis present

## 2013-10-16 DIAGNOSIS — D649 Anemia, unspecified: Secondary | ICD-10-CM

## 2013-10-16 DIAGNOSIS — E669 Obesity, unspecified: Secondary | ICD-10-CM

## 2013-10-16 DIAGNOSIS — M6282 Rhabdomyolysis: Secondary | ICD-10-CM | POA: Diagnosis present

## 2013-10-16 DIAGNOSIS — T675XXA Heat exhaustion, unspecified, initial encounter: Secondary | ICD-10-CM

## 2013-10-16 DIAGNOSIS — Z7982 Long term (current) use of aspirin: Secondary | ICD-10-CM

## 2013-10-16 DIAGNOSIS — N401 Enlarged prostate with lower urinary tract symptoms: Secondary | ICD-10-CM

## 2013-10-16 DIAGNOSIS — E559 Vitamin D deficiency, unspecified: Secondary | ICD-10-CM

## 2013-10-16 DIAGNOSIS — G894 Chronic pain syndrome: Secondary | ICD-10-CM

## 2013-10-16 DIAGNOSIS — R4182 Altered mental status, unspecified: Secondary | ICD-10-CM | POA: Diagnosis present

## 2013-10-16 DIAGNOSIS — Z79899 Other long term (current) drug therapy: Secondary | ICD-10-CM

## 2013-10-16 DIAGNOSIS — R5381 Other malaise: Secondary | ICD-10-CM | POA: Diagnosis present

## 2013-10-16 DIAGNOSIS — X30XXXA Exposure to excessive natural heat, initial encounter: Secondary | ICD-10-CM | POA: Diagnosis present

## 2013-10-16 DIAGNOSIS — N138 Other obstructive and reflux uropathy: Secondary | ICD-10-CM

## 2013-10-16 DIAGNOSIS — Z87891 Personal history of nicotine dependence: Secondary | ICD-10-CM

## 2013-10-16 DIAGNOSIS — J449 Chronic obstructive pulmonary disease, unspecified: Secondary | ICD-10-CM | POA: Diagnosis present

## 2013-10-16 DIAGNOSIS — E785 Hyperlipidemia, unspecified: Secondary | ICD-10-CM

## 2013-10-16 DIAGNOSIS — E119 Type 2 diabetes mellitus without complications: Secondary | ICD-10-CM

## 2013-10-16 DIAGNOSIS — J4489 Other specified chronic obstructive pulmonary disease: Secondary | ICD-10-CM

## 2013-10-16 DIAGNOSIS — I10 Essential (primary) hypertension: Secondary | ICD-10-CM

## 2013-10-16 DIAGNOSIS — G8929 Other chronic pain: Secondary | ICD-10-CM | POA: Diagnosis present

## 2013-10-16 DIAGNOSIS — T796XXA Traumatic ischemia of muscle, initial encounter: Secondary | ICD-10-CM

## 2013-10-16 LAB — GLUCOSE, CAPILLARY: Glucose-Capillary: 138 mg/dL — ABNORMAL HIGH (ref 70–99)

## 2013-10-16 LAB — URINALYSIS, ROUTINE W REFLEX MICROSCOPIC
BILIRUBIN URINE: NEGATIVE
Glucose, UA: >1000 mg/dL — AB
HGB URINE DIPSTICK: NEGATIVE
Ketones, ur: NEGATIVE mg/dL
Leukocytes, UA: NEGATIVE
Nitrite: NEGATIVE
Protein, ur: NEGATIVE mg/dL
SPECIFIC GRAVITY, URINE: 1.022 (ref 1.005–1.030)
Urobilinogen, UA: 1 mg/dL (ref 0.0–1.0)
pH: 7.5 (ref 5.0–8.0)

## 2013-10-16 LAB — COMPREHENSIVE METABOLIC PANEL
ALT: 16 U/L (ref 0–53)
AST: 28 U/L (ref 0–37)
Albumin: 3.8 g/dL (ref 3.5–5.2)
Alkaline Phosphatase: 102 U/L (ref 39–117)
Anion gap: 17 — ABNORMAL HIGH (ref 5–15)
BUN: 11 mg/dL (ref 6–23)
CALCIUM: 9.3 mg/dL (ref 8.4–10.5)
CO2: 26 meq/L (ref 19–32)
Chloride: 97 mEq/L (ref 96–112)
Creatinine, Ser: 0.66 mg/dL (ref 0.50–1.35)
GFR calc Af Amer: >90 mL/min (ref >90)
GLUCOSE: 133 mg/dL — AB (ref 70–99)
POTASSIUM: 4.3 meq/L (ref 3.7–5.3)
Sodium: 140 mEq/L (ref 137–147)
Total Bilirubin: 1 mg/dL (ref 0.3–1.2)
Total Protein: 7.6 g/dL (ref 6.0–8.3)

## 2013-10-16 LAB — I-STAT ARTERIAL BLOOD GAS, ED
Acid-Base Excess: 4 mmol/L — ABNORMAL HIGH (ref 0.0–2.0)
Bicarbonate: 27.6 meq/L — ABNORMAL HIGH (ref 20.0–24.0)
O2 Saturation: 92 %
Patient temperature: 103.1
TCO2: 29 mmol/L (ref 0–100)
pCO2 arterial: 40.9 mmHg (ref 35.0–45.0)
pH, Arterial: 7.447 (ref 7.350–7.450)
pO2, Arterial: 70 mmHg — ABNORMAL LOW (ref 80.0–100.0)

## 2013-10-16 LAB — CBC
HCT: 38.4 % — ABNORMAL LOW (ref 39.0–52.0)
HEMOGLOBIN: 12.5 g/dL — AB (ref 13.0–17.0)
MCH: 28.4 pg (ref 26.0–34.0)
MCHC: 32.6 g/dL (ref 30.0–36.0)
MCV: 87.3 fL (ref 78.0–100.0)
PLATELETS: 241 10*3/uL (ref 150–400)
RBC: 4.4 MIL/uL (ref 4.22–5.81)
RDW: 15.8 % — ABNORMAL HIGH (ref 11.5–15.5)
WBC: 15.5 10*3/uL — AB (ref 4.0–10.5)

## 2013-10-16 LAB — CK
Total CK: 678 U/L — ABNORMAL HIGH (ref 7–232)
Total CK: 2634 U/L — ABNORMAL HIGH (ref 7–232)

## 2013-10-16 LAB — CBG MONITORING, ED: GLUCOSE-CAPILLARY: 127 mg/dL — AB (ref 70–99)

## 2013-10-16 LAB — TROPONIN I

## 2013-10-16 LAB — URINE MICROSCOPIC-ADD ON

## 2013-10-16 LAB — I-STAT CG4 LACTIC ACID, ED: Lactic Acid, Venous: 2.53 mmol/L — ABNORMAL HIGH (ref 0.5–2.2)

## 2013-10-16 LAB — SALICYLATE LEVEL

## 2013-10-16 LAB — I-STAT TROPONIN, ED: Troponin i, poc: 0.03 ng/mL (ref 0.00–0.08)

## 2013-10-16 LAB — ACETAMINOPHEN LEVEL: Acetaminophen (Tylenol), Serum: <15 ug/mL (ref 10–30)

## 2013-10-16 LAB — ETHANOL: Alcohol, Ethyl (B): <11 mg/dL (ref 0–11)

## 2013-10-16 MED ORDER — ACETAMINOPHEN 650 MG RE SUPP: 650.0000 mg | Freq: Four times a day (QID) | RECTAL | Status: DC | PRN

## 2013-10-16 MED ORDER — NALOXONE HCL 0.4 MG/ML IJ SOLN
0.4000 mg | Freq: Once | INTRAMUSCULAR | Status: AC
  Administered 2013-10-16: 0.4 mg via INTRAVENOUS
  Filled 2013-10-16: qty 1

## 2013-10-16 MED ORDER — TAMSULOSIN HCL 0.4 MG PO CAPS
0.4000 mg | ORAL_CAPSULE | Freq: Every day | ORAL | Status: DC
  Administered 2013-10-17 – 2013-10-18 (×2): 0.4 mg via ORAL
  Filled 2013-10-16 (×2): qty 1

## 2013-10-16 MED ORDER — PREGABALIN 25 MG PO CAPS
150.0000 mg | ORAL_CAPSULE | Freq: Two times a day (BID) | ORAL | Status: DC
  Administered 2013-10-16 – 2013-10-18 (×4): 150 mg via ORAL
  Filled 2013-10-16 (×2): qty 1
  Filled 2013-10-16 (×4): qty 2
  Filled 2013-10-16 (×2): qty 1

## 2013-10-16 MED ORDER — SODIUM CHLORIDE 0.9 % IV BOLUS (SEPSIS)
500.0000 mL | Freq: Once | INTRAVENOUS | Status: AC
  Administered 2013-10-16: 500 mL via INTRAVENOUS

## 2013-10-16 MED ORDER — INSULIN ASPART 100 UNIT/ML ~~LOC~~ SOLN: 0.0000 [IU] | Freq: Three times a day (TID) | SUBCUTANEOUS | Status: DC

## 2013-10-16 MED ORDER — INSULIN ASPART 100 UNIT/ML ~~LOC~~ SOLN: 0.0000 [IU] | Freq: Every day | SUBCUTANEOUS | Status: DC

## 2013-10-16 MED ORDER — OXYCODONE HCL 5 MG PO TABS: 5.0000 mg | ORAL_TABLET | ORAL | Status: DC | PRN

## 2013-10-16 MED ORDER — ALUM & MAG HYDROXIDE-SIMETH 200-200-20 MG/5ML PO SUSP
30.0000 mL | Freq: Four times a day (QID) | ORAL | Status: DC | PRN
Start: 2013-10-16 — End: 2013-10-18

## 2013-10-16 MED ORDER — SODIUM CHLORIDE 0.9 % IJ SOLN
3.0000 mL | Freq: Two times a day (BID) | INTRAMUSCULAR | Status: DC
Start: 2013-10-16 — End: 2013-10-18
  Administered 2013-10-17 – 2013-10-18 (×3): 3 mL via INTRAVENOUS

## 2013-10-16 MED ORDER — ENOXAPARIN SODIUM 40 MG/0.4ML ~~LOC~~ SOLN
40.0000 mg | SUBCUTANEOUS | Status: DC
  Administered 2013-10-17 – 2013-10-18 (×2): 40 mg via SUBCUTANEOUS
  Filled 2013-10-16 (×2): qty 0.4

## 2013-10-16 MED ORDER — SODIUM CHLORIDE 0.9 % IV SOLN
INTRAVENOUS | Status: DC
  Administered 2013-10-16 – 2013-10-18 (×4): via INTRAVENOUS

## 2013-10-16 MED ORDER — ACETAMINOPHEN 325 MG PO TABS: 650.0000 mg | ORAL_TABLET | Freq: Four times a day (QID) | ORAL | Status: DC | PRN

## 2013-10-16 MED ORDER — ONDANSETRON HCL 4 MG/2ML IJ SOLN: 4.0000 mg | Freq: Four times a day (QID) | INTRAMUSCULAR | Status: DC | PRN

## 2013-10-16 MED ORDER — CLONIDINE HCL 0.1 MG PO TABS
0.1000 mg | ORAL_TABLET | Freq: Two times a day (BID) | ORAL | Status: DC
  Administered 2013-10-16 – 2013-10-18 (×4): 0.1 mg via ORAL
  Filled 2013-10-16 (×5): qty 1

## 2013-10-16 MED ORDER — SODIUM CHLORIDE 0.9 % IV BOLUS (SEPSIS)
1000.0000 mL | Freq: Once | INTRAVENOUS | Status: AC
  Administered 2013-10-16: 1000 mL via INTRAVENOUS

## 2013-10-16 MED ORDER — ONDANSETRON HCL 4 MG PO TABS: 4.0000 mg | ORAL_TABLET | Freq: Four times a day (QID) | ORAL | Status: DC | PRN

## 2013-10-16 MED ORDER — CYCLOBENZAPRINE HCL 10 MG PO TABS
10.0000 mg | ORAL_TABLET | Freq: Two times a day (BID) | ORAL | Status: DC | PRN
  Administered 2013-10-17: 10 mg via ORAL
  Filled 2013-10-16: qty 1

## 2013-10-16 MED ORDER — HYDROMORPHONE HCL PF 1 MG/ML IJ SOLN
0.5000 mg | INTRAMUSCULAR | Status: DC | PRN
  Administered 2013-10-16 – 2013-10-17 (×2): 1 mg via INTRAVENOUS
  Filled 2013-10-16 (×3): qty 1

## 2013-10-16 MED ORDER — ASPIRIN 81 MG PO CHEW
81.0000 mg | CHEWABLE_TABLET | Freq: Every day | ORAL | Status: DC
  Administered 2013-10-17 – 2013-10-18 (×2): 81 mg via ORAL
  Filled 2013-10-16 (×2): qty 1

## 2013-10-16 MED ORDER — ATORVASTATIN CALCIUM 80 MG PO TABS
80.0000 mg | ORAL_TABLET | Freq: Every day | ORAL | Status: DC
  Filled 2013-10-16: qty 1

## 2013-10-16 MED ORDER — DULOXETINE HCL 60 MG PO CPEP
60.0000 mg | ORAL_CAPSULE | Freq: Every day | ORAL | Status: DC
  Administered 2013-10-17 – 2013-10-18 (×2): 60 mg via ORAL
  Filled 2013-10-16 (×2): qty 1

## 2013-10-16 NOTE — ED Provider Notes (Signed)
CSN: [id number]     Arrival date & time 10/16/13  1807 History   First MD Initiated Contact with Patient 10/16/13 1813     Chief Complaint  Patient presents with  . Altered Mental Status     (Consider location/radiation/quality/duration/timing/severity/associated sxs/prior Treatment) HPI 68 year old male with past medical history of diabetes as well as chronic pain with implantable morphine pump and fentanyl pump. Per the wife the patient has been acting altered all day and stated specifically that he had not taken more of his pain medication. Patient was found down outside by a neighbor as reported to have been out in the sun for approximately one hour. Patient states that he was chasing his brother outside and when he could not catch him that he start walking back to the house and collapsed because of weakness. White endorses of the brothers at the beach and that the story was not true. Patient denies any chest pain shortness of breath or weakness to one side. Patient is notably altered but is alert and oriented x3. Patient states that he did fall and hit the back of his head secondary to his fall. Patient denies any headache at this time. Patient and wife adamantly state that he has not had history of alcohol abuse.  Past Medical History  Diagnosis Date  . Anemia   . Arthritis   . Blood transfusion   . Clotting disorder   . Diabetes mellitus   . COPD (chronic obstructive pulmonary disease)   . CHF (congestive heart failure)   . Borderline hypertension   . Venous insufficiency   . Hyperlipidemia   . Obesity   . Diverticulosis of colon   . Hx of colonic polyps   . Low back pain   . Lipoma   . RSD (reflex sympathetic dystrophy)   . Pneumonia   . Mental disorder   . Depression     hx of    Past Surgical History  Procedure Laterality Date  . Back surgery  1517,6160  . Morphine pump  2009    due to reaction to morphine  . Excision of lipoma from right olecranon area  11/2000     Dr. Rise [name]  . Microdiscectomy and decompression  05/2002    Dr. Tonita Cong  . C3-4 anterior cervical discectomy and fusion with plating at c3-4  05/2006    Dr. Arnoldo Morale  . Spinal cord stimulator implanted      for pain per Dr. Maryruth Eve  . Subcut pain pump implanted    . Pain pump implantation      with fentanyl  . Tonsillectomy    . Colonoscopy w/ polypectomy    . Knee arthroscopy      right knee  . 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 Total knee arthroplasty  02/29/2012    Procedure: TOTAL KNEE ARTHROPLASTY;  Surgeon: Kerin Salen, MD;  Location: [facility];  Service: Orthopedics;  Laterality: Right;   Family History  Problem Relation Age of Onset  . Cancer Father   . Colon cancer Neg Hx   . Esophageal cancer Neg Hx   . Stomach cancer Neg Hx   . Rectal cancer Neg Hx    History  Substance Use Topics  . Smoking status: Former Smoker -- 3.00 packs/day for 40 years    Types: Cigarettes    Quit date: 05/25/2002  . Smokeless tobacco: Never Used  . Alcohol Use: No    Review of Systems  Constitutional:  Positive for fever. Negative for activity change.  HENT: Negative for congestion.   Eyes: Negative for visual disturbance.  Respiratory: Negative for cough and shortness of breath.   Cardiovascular: Negative for chest pain and leg swelling.  Gastrointestinal: Negative for abdominal pain and blood in stool.  Genitourinary: Negative for dysuria and hematuria.  Musculoskeletal: Negative for back pain.  Skin: Negative for color change.  Neurological: Positive for headaches. Negative for syncope.  Psychiatric/Behavioral: Positive for confusion. Negative for agitation.      Allergies  Codeine and Morphine and related  Home Medications   Prior to Admission medications   Medication Sig Start Date End Date Taking? Authorizing Provider  aspirin 81 MG chewable tablet Chew 81 mg by mouth.    Historical Provider, MD  atorvastatin (LIPITOR) 80 MG tablet Take 1/2 to 1 tablet  daily or as directed for cholesterol 09/20/13 09/21/14  Unk Pinto, MD  Canagliflozin 100 MG TABS Take one tablet by mouth daily 05/07/13   Noralee Space, MD  celecoxib (CELEBREX) 200 MG capsule Take 200 mg by mouth 2 (two) times daily.      Historical Provider, MD  cloNIDine (CATAPRES) 0.1 MG tablet Take 1 tablet (0.1 mg total) by mouth 2 (two) times daily. 11/02/12   Noralee Space, MD  cyclobenzaprine (FLEXERIL) 10 MG tablet Take 10 mg by mouth 2 (two) times daily as needed. Muscle spasm. 03/02/11   Historical Provider, MD  DULoxetine (CYMBALTA) 60 MG capsule 60 mg. 10/09/12   Historical Provider, MD  ENDOCET 10-325 MG per tablet Take 1 tablet by mouth 3 (three) times daily as needed. pain 03/08/11   Historical Provider, MD  fentaNYL 0.05 MG/ML SOLN 5,000 mcg Inject into the skin continuous. Patient will bring dosage instructions with him. Daily dose 0.84mg     Historical Provider, MD  lidocaine (LIDODERM) 5 % Place 1 patch onto the skin. 08/01/13   Historical Provider, MD  LYRICA 150 MG capsule Take 150 mg by mouth 2 (two) times daily.  02/21/11   Historical Provider, MD  metFORMIN (GLUCOPHAGE) 500 MG tablet Take 500 mg by mouth.    Historical Provider, MD  oxyCODONE-acetaminophen (ENDOCET) 10-325 MG per tablet 1 tablet. 01/31/13   Historical Provider, MD  pregabalin (LYRICA) 150 MG capsule 150 mg. 01/14/13   Historical Provider, MD  sitaGLIPtin (JANUVIA) 100 MG tablet Take 100 mg by mouth.    Historical Provider, MD  tamsulosin (FLOMAX) 0.4 MG CAPS capsule Take by mouth. 05/07/13   Historical Provider, MD  traMADol (ULTRAM) 50 MG tablet Take 50 mg by mouth 3 (three) times daily as needed. pain 02/21/11   Historical Provider, MD   BP 113/84  Pulse 118  Temp(Src) 103.1 F (39.5 C) (Rectal)  Resp 22  SpO2 93% Physical Exam  Nursing note and vitals reviewed. Constitutional: He is oriented to person, place, and time. He appears well-developed and well-nourished.  HENT:  Head: Normocephalic.   Eyes: Pupils are equal, round, and reactive to light.  Neck: Neck supple.  Cardiovascular: Normal rate and regular rhythm.  Exam reveals no gallop and no friction rub.   No murmur heard. Pulmonary/Chest: Effort normal. No respiratory distress.  Abdominal: Soft. He exhibits no distension. There is no tenderness.  Musculoskeletal: He exhibits no edema.  Lower lumbar spine tenderness  Neurological: He is alert and oriented to person, place, and time.  Alert and oriented to person place and time. However stating stories about recent events that are not true.  Overall patient is  altered  Skin: Skin is warm.  Psychiatric: He has a normal mood and affect.   Cranial nerves III-XII grossly intact Strength 5+/5+ to upper and lower extremities bilaterally with resistance applied, equal distribution noted Strength intact to MCP, PIP, DIP joints of  hand Negative arm drift Fine motor skills intact Heel to knee down shin normal bilaterally Gait proper, proper balance - negative sway, negative drift, negative step-offs     ED Course  Procedures (including critical care time) Labs Review Labs Reviewed  CBC - Abnormal; Notable for the following:    WBC 15.5 (*)    Hemoglobin 12.5 (*)    HCT 38.4 (*)    RDW 15.8 (*)    All other components within normal limits  COMPREHENSIVE METABOLIC PANEL  URINALYSIS, ROUTINE W REFLEX MICROSCOPIC  ETHANOL  ACETAMINOPHEN LEVEL  SALICYLATE LEVEL  BLOOD GAS, ARTERIAL  CBG MONITORING, ED  I-STAT TROPOININ, ED  I-STAT CG4 LACTIC ACID, ED    Imaging Review Dg Chest 2 View  10/16/2013   CLINICAL DATA:  Altered mental status.  EXAM: CHEST  2 VIEW  COMPARISON:  02/21/2012  FINDINGS: Heart size and pulmonary vascularity are normal. Lungs are hyperinflated with chronic accentuation of the interstitial markings. Spinal cord stimulator in place. No acute osseous abnormality.  IMPRESSION: No acute abnormality.  Chronic lung disease.   Electronically Signed   By:  Rozetta Nunnery M.D.   On: 10/16/2013 20:04   Ct Head Wo Contrast  10/16/2013   CLINICAL DATA:  Altered mental status. The patient was found down. He states that he fell.  EXAM: CT HEAD WITHOUT CONTRAST  CT CERVICAL SPINE WITHOUT CONTRAST  TECHNIQUE: Multidetector CT imaging of the head and cervical spine was performed following the standard protocol without intravenous contrast. Multiplanar CT image reconstructions of the cervical spine were also generated.  COMPARISON:  None.  FINDINGS: CT HEAD FINDINGS  There is no acute intracranial hemorrhage, infarction, or mass lesion. There is diffuse moderate cerebellar atrophy and slight cerebral cortical atrophy. No ventricular dilatation. No osseous abnormality.  CT CERVICAL SPINE FINDINGS  There is no fracture or acute subluxation. Previous anterior fusion at C3-4. Chronic central calcified disc protrusions at C2-3 and at C4-5, unchanged. No prevertebral soft tissue swelling. 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  IMPRESSION: 1. No acute intracranial abnormality.  Atrophy. 2. No acute abnormality of the cervical spine.   Electronically Signed   By: Rozetta Nunnery M.D.   On: 10/16/2013 19:44   Ct Cervical Spine Wo Contrast  10/16/2013   CLINICAL DATA:  Altered mental status. The patient was found down. He states that he fell.  EXAM: CT HEAD WITHOUT CONTRAST  CT CERVICAL SPINE WITHOUT CONTRAST  TECHNIQUE: Multidetector CT imaging of the head and cervical spine was performed following the standard protocol without intravenous contrast. Multiplanar CT image reconstructions of the cervical spine were also generated.  COMPARISON:  None.  FINDINGS: CT HEAD FINDINGS  There is no acute intracranial hemorrhage, infarction, or mass lesion. There is diffuse moderate cerebellar atrophy and slight cerebral cortical atrophy. No ventricular dilatation. No osseous abnormality.  CT CERVICAL SPINE FINDINGS  There is no fracture or acute subluxation. Previous anterior fusion at C3-4. Chronic  central calcified disc protrusions at C2-3 and at C4-5, unchanged. No prevertebral soft tissue swelling. 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  IMPRESSION: 1. No acute intracranial abnormality.  Atrophy. 2. No acute abnormality of the cervical spine.   Electronically Signed   By: Clair Gulling  Maxwell M.D.   On: 10/16/2013 19:44     EKG Interpretation   Date/Time:  Wednesday October 16 2013 18:15:55 EDT Ventricular Rate:  123 PR Interval:  161 QRS Duration: 98 QT Interval:  343 QTC Calculation: 491 R Axis:   73 Text Interpretation:  Sinus tachycardia Borderline T wave abnormalities  Borderline prolonged QT interval Confirmed by Beau Fanny  MD, DOUGLAS ([id number])  on 10/16/2013 6:38:37 PM      MDM   Final diagnoses:  Heat exhaustion, initial encounter  Anemia, unspecified anemia type  Chronic airway obstruction, not elsewhere classified  Chronic pain syndrome  Other and unspecified hyperlipidemia  Traumatic rhabdomyolysis, initial encounter  Type II or unspecified type diabetes mellitus without mention of complication, not stated as uncontrolled  Unspecified essential hypertension   68 year old male presents with altered mental status and fever after being found down outside in the heat for over an hour. Patient is alert to person place and time however is not able to account the event appropriately. Patient does state that he hit his head. Patient noted on initial presentation to be hypoxic and febrile and tachycardic. Initially it was felt the patient had CHF per medical record and was started on 500 cc cold fluids. When the wife arrived she endorsed the patient has not had CHF. At this time the patient was given an additional liter.  The patient is on multiple pain medications for chronic back pain as well implanted pain pumps. Patient given Narcan without much relief. Patient evaluated with head CT which was negative and C-spine CT which was also negative. Glucose 138 unlikely cause of altered mental  status. Troponin negative. EKG no signs of ischemia .Blood culture sent ABG not grossly abnormal. Lactic acid 2.5.  Patient has a mixed picture of potential infectious etiology versus heat extraction. Patient does have an elevated CK. However does not have an elevated potassium and creatinine is 0.66. During the stay in the emergency department patient did become less altered is mostly concerned about his chronic back pain. Patient was admitted to the [facility] for further workup for altered mental status.    Claudean Severance, MD 10/17/13 [id number]

## 2013-10-16 NOTE — ED Notes (Signed)
Pt transported to CT ?

## 2013-10-16 NOTE — ED Notes (Signed)
Per wife, pt has been drinking EtOH today.

## 2013-10-16 NOTE — ED Notes (Signed)
Iced saline started per EDP order.

## 2013-10-16 NOTE — ED Notes (Signed)
Family at bedside. 

## 2013-10-16 NOTE — H&P (Signed)
[facility] History and Physical  James Parsons DJM:[id number] DOB: Nov 11, 1945 DOA: 10/16/2013  Referring physician: EDP PCP: Alesia Richards, MD  Specialists:   Chief Complaint:  [place] down outside and could not get up  HPI: James Parsons is a 68 y.o. male with Multiple Medical Problems including COPD, CHF, DM2 and Chronic Pain who walks with a cane and he was outside in his yard walking when his cane slipped on the gravel in the driveway, and he fell onto the ground.  He was not able to get up and lay on his driveway for the next 2 hours until he was found.   He was brought to the ED and evaluated and found to have a CPK level of 678 and a temperature of 103.1 and a sinus Tachycardia.  He was referred for admission.         Review of Systems:  Constitutional: No Weight Loss, No Weight Gain, Night Sweats, Fevers, Chills, Fatigue, or Generalized Weakness HEENT: No Headaches, Difficulty Swallowing,Tooth/Dental Problems,Sore Throat,  No Sneezing, Rhinitis, Ear Ache, Nasal Congestion, or Post Nasal Drip,  Cardio-vascular:  No Chest pain, Orthopnea, PND, Edema in lower extremities, Anasarca, Dizziness, Palpitations  Resp: No Dyspnea, No DOE, No Cough, No Hemoptysis,  No Wheezing.    GI: No Heartburn, Indigestion, Abdominal Pain, Nausea, Vomiting, Diarrhea, Change in Bowel Habits,  Loss of Appetite  GU: No Dysuria, Change in Color of Urine, No Urgency or Frequency.  No flank pain.  Musculoskeletal: No Joint Pain or Swelling.  No Decreased Range of Motion. No Back Pain.  Neurologic: No Syncope, No Seizures, Muscle Weakness, Paresthesia, Vision Disturbance or Loss, No Diplopia, No Vertigo, No Difficulty Walking,  Skin: No Rash or Lesions. Psych: No Change in Mood or Affect. No Depression or Anxiety. No Memory loss. No Confusion or Hallucinations   Past Medical History  Diagnosis Date  . Anemia   . Arthritis   . Blood transfusion   . Clotting disorder   . Diabetes mellitus   .  COPD (chronic obstructive pulmonary disease)   . CHF (congestive heart failure)   . Borderline hypertension   . Venous insufficiency   . Hyperlipidemia   . Obesity   . Diverticulosis of colon   . Hx of colonic polyps   . Low back pain   . Lipoma   . RSD (reflex sympathetic dystrophy)   . Pneumonia   . Mental disorder   . Depression     hx of       Past Surgical History  Procedure Laterality Date  . Back surgery  2229,7989  . Morphine pump  2009    due to reaction to morphine  . Excision of lipoma from right olecranon area  11/2000    Dr. Rise [name]  . Microdiscectomy and decompression  05/2002    Dr. Tonita Cong  . C3-4 anterior cervical discectomy and fusion with plating at c3-4  05/2006    Dr. Arnoldo Morale  . Spinal cord stimulator implanted      for pain per Dr. Maryruth Eve  . Subcut pain pump implanted    . Pain pump implantation      with fentanyl  . Tonsillectomy    . Colonoscopy w/ polypectomy    . Knee arthroscopy      right knee  . 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 Total knee arthroplasty  02/29/2012    Procedure: TOTAL KNEE ARTHROPLASTY;  Surgeon: Kerin Salen, MD;  Location:  [place] [facility];  Service: Orthopedics;  Laterality: Right;      Prior to Admission medications   Medication Sig Start Date End Date Taking? Authorizing Provider  aspirin 81 MG chewable tablet Chew 81 mg by mouth daily.    Yes Historical Provider, MD  atorvastatin (LIPITOR) 80 MG tablet Take 80 mg by mouth daily.   Yes Historical Provider, MD  celecoxib (CELEBREX) 200 MG capsule Take 200 mg by mouth 2 (two) times daily.     Yes Historical Provider, MD  cloNIDine (CATAPRES) 0.1 MG tablet Take 1 tablet (0.1 mg total) by mouth 2 (two) times daily. 11/02/12  Yes Noralee Space, MD  cyclobenzaprine (FLEXERIL) 10 MG tablet Take 10 mg by mouth 2 (two) times daily as needed. Muscle spasm. 03/02/11  Yes Historical Provider, MD  DULoxetine (CYMBALTA) 60 MG capsule Take 60 mg by mouth daily.  10/09/12  Yes  Historical Provider, MD  ENDOCET 10-325 MG per tablet Take 1 tablet by mouth 3 (three) times daily as needed. pain 03/08/11  Yes Historical Provider, MD  fentaNYL 0.05 MG/ML SOLN 5,000 mcg Inject into the skin continuous. Patient will bring dosage instructions with him. Daily dose 0.84mg    Yes Historical Provider, MD  LYRICA 150 MG capsule Take 150 mg by mouth 2 (two) times daily.  02/21/11  Yes Historical Provider, MD  metFORMIN (GLUCOPHAGE) 500 MG tablet Take 500 mg by mouth 2 (two) times daily with a meal.    Yes Historical Provider, MD  sitaGLIPtin (JANUVIA) 100 MG tablet Take 50 mg by mouth daily.    Yes Historical Provider, MD  tamsulosin (FLOMAX) 0.4 MG CAPS capsule Take 0.4 mg by mouth daily.  05/07/13  Yes Historical Provider, MD  traMADol (ULTRAM) 50 MG tablet Take 50 mg by mouth 3 (three) times daily as needed. pain 02/21/11  Yes Historical Provider, MD     Allergies  Allergen Reactions  . Codeine Itching  . Morphine And Related Other (See Comments)    hallucinations    Social History:  reports that he quit smoking about 11 years ago. His smoking use included Cigarettes. He has a 120 pack-year smoking history. He has never used smokeless tobacco. He reports that he does not drink alcohol or use illicit drugs.     Family History  Problem Relation Age of Onset  . Cancer Father   . Colon cancer Neg Hx   . Esophageal cancer Neg Hx   . Stomach cancer Neg Hx   . Rectal cancer Neg Hx       Physical Exam:  GEN:  Pleasant Obese  68 y.o. Caucasian male examined and in no acute distress; cooperative with exam Filed Vitals:   10/16/13 2015 10/16/13 2045 10/16/13 2100 10/16/13 2115  BP: 130/89 127/84 112/86   Pulse: 122 118 121   Temp:    100.3 F (37.9 C)  TempSrc:    Rectal  Resp: 22 22 21    SpO2: 96% 95% 92%    Blood pressure 112/86, pulse 121, temperature 100.3 F (37.9 C), temperature source Rectal, resp. rate 21, SpO2 92.00%. PSYCH: He is alert and oriented x4; does  not appear anxious does not appear depressed; affect is normal HEENT: Normocephalic and Atraumatic, Mucous membranes pink; PERRLA; EOM intact; Fundi:  Benign;  No scleral icterus, Nares: Patent, Oropharynx: Clear, Fair Dentition, Neck:  FROM, no cervical lymphadenopathy nor thyromegaly or carotid bruit; no JVD; Breasts:: Not examined CHEST WALL: No tenderness CHEST: Normal respiration, clear to auscultation bilaterally HEART:  Regular rate and rhythm; no murmurs rubs or gallops BACK: No kyphosis or scoliosis; no CVA tenderness ABDOMEN: Positive Bowel Sounds, Obese, soft non-tender; no masses, no organomegaly. Rectal Exam: Not done EXTREMITIES: No cyanosis, clubbing or edema; no ulcerations. Genitalia: not examined PULSES: 2+ and symmetric SKIN: Normal hydration no rash or ulceration CNS:  Alert and Oriented x 4, No Focal Deficits.     Vascular: pulses palpable throughout    Labs on Admission:  Basic Metabolic Panel:  Recent Labs Lab 10/16/13 1830  NA 140  K 4.3  CL 97  CO2 26  GLUCOSE 133*  BUN 11  CREATININE 0.66  CALCIUM 9.3   Liver Function Tests:  Recent Labs Lab 10/16/13 1830  AST 28  ALT 16  ALKPHOS 102  BILITOT 1.0  PROT 7.6  ALBUMIN 3.8   No results found for this basename: LIPASE, AMYLASE,  in the last 168 hours No results found for this basename: AMMONIA,  in the last 168 hours CBC:  Recent Labs Lab 10/16/13 1830  WBC 15.5*  HGB 12.5*  HCT 38.4*  MCV 87.3  PLT 241   Cardiac Enzymes:  Recent Labs Lab 10/16/13 1830  CKTOTAL 678*    BNP (last 3 results) No results found for this basename: PROBNP,  in the last 8760 hours CBG:  Recent Labs Lab 10/16/13 1841  GLUCAP 127*    Radiological Exams on Admission: Dg Chest 2 View  10/16/2013   CLINICAL DATA:  Altered mental status.  EXAM: CHEST  2 VIEW  COMPARISON:  02/21/2012  FINDINGS: Heart size and pulmonary vascularity are normal. Lungs are hyperinflated with chronic accentuation of the  interstitial markings. Spinal cord stimulator in place. No acute osseous abnormality.  IMPRESSION: No acute abnormality.  Chronic lung disease.   Electronically Signed   By: Rozetta Nunnery M.D.   On: 10/16/2013 20:04   Ct Head Wo Contrast  10/16/2013   CLINICAL DATA:  Altered mental status. The patient was found down. He states that he fell.  EXAM: CT HEAD WITHOUT CONTRAST  CT CERVICAL SPINE WITHOUT CONTRAST  TECHNIQUE: Multidetector CT imaging of the head and cervical spine was performed following the standard protocol without intravenous contrast. Multiplanar CT image reconstructions of the cervical spine were also generated.  COMPARISON:  None.  FINDINGS: CT HEAD FINDINGS  There is no acute intracranial hemorrhage, infarction, or mass lesion. There is diffuse moderate cerebellar atrophy and slight cerebral cortical atrophy. No ventricular dilatation. No osseous abnormality.  CT CERVICAL SPINE FINDINGS  There is no fracture or acute subluxation. Previous anterior fusion at C3-4. Chronic central calcified disc protrusions at C2-3 and at C4-5, unchanged. No prevertebral soft tissue swelling. 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  IMPRESSION: 1. No acute intracranial abnormality.  Atrophy. 2. No acute abnormality of the cervical spine.   Electronically Signed   By: Rozetta Nunnery M.D.   On: 10/16/2013 19:44   Ct Cervical Spine Wo Contrast  10/16/2013   CLINICAL DATA:  Altered mental status. The patient was found down. He states that he fell.  EXAM: CT HEAD WITHOUT CONTRAST  CT CERVICAL SPINE WITHOUT CONTRAST  TECHNIQUE: Multidetector CT imaging of the head and cervical spine was performed following the standard protocol without intravenous contrast. Multiplanar CT image reconstructions of the cervical spine were also generated.  COMPARISON:  None.  FINDINGS: CT HEAD FINDINGS  There is no acute intracranial hemorrhage, infarction, or mass lesion. There is diffuse moderate cerebellar atrophy and slight cerebral cortical  atrophy. No ventricular dilatation. No osseous abnormality.  CT CERVICAL SPINE FINDINGS  There is no fracture or acute subluxation. Previous anterior fusion at C3-4. Chronic central calcified disc protrusions at C2-3 and at C4-5, unchanged. No prevertebral soft tissue swelling. 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  IMPRESSION: 1. No acute intracranial abnormality.  Atrophy. 2. No acute abnormality of the cervical spine.   Electronically Signed   By: Rozetta Nunnery M.D.   On: 10/16/2013 19:44      EKG: Independently reviewed. Sinus Tachycardia at 123    Assessment/Plan:   68 y.o. male with  Principal Problem:   Heat exhaustion Active Problems:   Rhabdomyolysis   HYPERLIPIDEMIA   Hypertension   COPD   Chronic pain syndrome   T2_NIDDM   Altered mental status    1.   Heat Exhaustion-   IVFs and supportive care.     2.   Rhabdomyolysis- CPK level = 600,  Monitor CPK level.  Continue Rehydration.     3.   DM2-   Hold Metformin and Januvia,  SSI coverage PRN, and check HBA1c.    4.   HTN-  Monitor BPs.    5.   COPD- stable.    6.   Hyperlipidemia-  Continue   7.   DVT prophylaxis with Lovenox.         Code Status:   FULL CODE    Family Communication:   Family at bedside  Disposition Plan:      Observation  Time spent:  [facility] Pager [phone]  If 7PM-7AM, please contact night-coverage [email or web address] Password [facility] 10/16/2013, 9:34 PM

## 2013-10-16 NOTE — ED Notes (Signed)
Lab results reported to EDP. 

## 2013-10-16 NOTE — ED Notes (Signed)
MD at bedside. 
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
## 2013-10-16 NOTE — ED Notes (Signed)
Report given to Ashley, RN

## 2013-10-16 NOTE — ED Notes (Signed)
Per pt's wife, pt may have taken more medication than normal due to being alone at home unattended.

## 2013-10-17 ENCOUNTER — Encounter (HOSPITAL_COMMUNITY): Payer: Self-pay | Admitting: Internal Medicine

## 2013-10-17 LAB — HEMOGLOBIN A1C
Hgb A1c MFr Bld: 7 % — ABNORMAL HIGH (ref <5.7)
Mean Plasma Glucose: 154 mg/dL — ABNORMAL HIGH (ref <117)

## 2013-10-17 LAB — TROPONIN I: Troponin I: <0.3 ng/mL (ref <0.30)

## 2013-10-17 LAB — CBC
HEMATOCRIT: 35.7 % — AB (ref 39.0–52.0)
HEMOGLOBIN: 11.5 g/dL — AB (ref 13.0–17.0)
MCH: 28.1 pg (ref 26.0–34.0)
MCHC: 32.2 g/dL (ref 30.0–36.0)
MCV: 87.3 fL (ref 78.0–100.0)
Platelets: 209 10*3/uL (ref 150–400)
RBC: 4.09 MIL/uL — ABNORMAL LOW (ref 4.22–5.81)
RDW: 16.3 % — ABNORMAL HIGH (ref 11.5–15.5)
WBC: 14.5 10*3/uL — ABNORMAL HIGH (ref 4.0–10.5)

## 2013-10-17 LAB — GLUCOSE, CAPILLARY
GLUCOSE-CAPILLARY: 111 mg/dL — AB (ref 70–99)
GLUCOSE-CAPILLARY: 124 mg/dL — AB (ref 70–99)
GLUCOSE-CAPILLARY: 138 mg/dL — AB (ref 70–99)
Glucose-Capillary: 163 mg/dL — ABNORMAL HIGH (ref 70–99)

## 2013-10-17 LAB — BASIC METABOLIC PANEL
Anion gap: 12 (ref 5–15)
BUN: 10 mg/dL (ref 6–23)
CHLORIDE: 103 meq/L (ref 96–112)
CO2: 26 meq/L (ref 19–32)
CREATININE: 0.61 mg/dL (ref 0.50–1.35)
Calcium: 8.8 mg/dL (ref 8.4–10.5)
GFR calc Af Amer: >90 mL/min (ref >90)
GFR calc non Af Amer: >90 mL/min (ref >90)
GLUCOSE: 156 mg/dL — AB (ref 70–99)
Potassium: 4.5 mEq/L (ref 3.7–5.3)
Sodium: 141 mEq/L (ref 137–147)

## 2013-10-17 MED ORDER — CELECOXIB 200 MG PO CAPS
200.0000 mg | ORAL_CAPSULE | Freq: Two times a day (BID) | ORAL | Status: DC
  Administered 2013-10-17 – 2013-10-18 (×3): 200 mg via ORAL
  Filled 2013-10-17 (×5): qty 1

## 2013-10-17 MED ORDER — OXYCODONE HCL 5 MG PO TABS
5.0000 mg | ORAL_TABLET | Freq: Three times a day (TID) | ORAL | Status: DC | PRN
  Administered 2013-10-17 – 2013-10-18 (×2): 5 mg via ORAL
  Filled 2013-10-17 (×2): qty 1

## 2013-10-17 MED ORDER — METFORMIN HCL 500 MG PO TABS
500.0000 mg | ORAL_TABLET | Freq: Two times a day (BID) | ORAL | Status: DC
  Administered 2013-10-17 – 2013-10-18 (×2): 500 mg via ORAL
  Filled 2013-10-17 (×4): qty 1

## 2013-10-17 MED ORDER — LINAGLIPTIN 5 MG PO TABS
5.0000 mg | ORAL_TABLET | Freq: Every day | ORAL | Status: DC
  Administered 2013-10-17 – 2013-10-18 (×2): 5 mg via ORAL
  Filled 2013-10-17 (×2): qty 1

## 2013-10-17 MED ORDER — OXYCODONE-ACETAMINOPHEN 10-325 MG PO TABS: 1.0000 | ORAL_TABLET | Freq: Three times a day (TID) | ORAL | Status: DC | PRN

## 2013-10-17 MED ORDER — OXYCODONE-ACETAMINOPHEN 5-325 MG PO TABS
1.0000 | ORAL_TABLET | Freq: Three times a day (TID) | ORAL | Status: DC | PRN
  Administered 2013-10-17 – 2013-10-18 (×2): 1 via ORAL
  Filled 2013-10-17 (×2): qty 1

## 2013-10-17 MED ORDER — TRAMADOL HCL 50 MG PO TABS: 50.0000 mg | ORAL_TABLET | Freq: Three times a day (TID) | ORAL | Status: DC | PRN

## 2013-10-17 NOTE — Progress Notes (Signed)
UR Completed Ricarda Atayde Graves-Bigelow, RN,BSN [phone]  

## 2013-10-17 NOTE — Progress Notes (Signed)
[facility] PROGRESS NOTE  KEIYON PLACK IPJ:[id number] DOB: 03-Mar-1946 DOA: 10/16/2013 PCP: Alesia Richards, MD  Assessment/Plan:  Principal Problem:   Heat exhaustion Active Problems:   Rhabdomyolysis: increase IVF to 100 cc/hr. cpk higher at 2600. NO h/o CHF per pt and wife.   HYPERLIPIDEMIA: hold statin due to rhabdomyolysis. Wife reports simvastatin changed to atorvastatin last month. Unclear whether statin contributing, or just related to fall and heat stroke   Hypertension   COPD stable   Chronic pain syndrome: resume home regimen   T2_NIDDM: resume home meds and d/c SSI Debility/falls. PT eval pending  Code Status:  full Family Communication:  Wife at bedside Disposition Plan:  home  Consultants:    Procedures:     Antibiotics:    HPI/Subjective: "can I go home?" no new complaints.  Objective: Filed Vitals:   10/17/13 0901  BP: 132/64  Pulse: 98  Temp: 98.1 F (36.7 C)  Resp: 20    Intake/Output Summary (Last 24 hours) at 10/17/13 1047 Last data filed at 10/17/13 1023  Gross per 24 hour  Intake 1216.75 ml  Output   2875 ml  Net -1658.25 ml   Filed Weights   10/16/13 2211 10/17/13 0524  Weight: 96.979 kg (213 lb 12.8 oz) 97.569 kg (215 lb 1.6 oz)    Exam:   General:  Alert, oriented, appropriate  Cardiovascular: RRR without MGR  Respiratory: CTA without WRR  Abdomen: S, NT, ND  Ext: no CCE  Basic Metabolic Panel:  Recent Labs Lab 10/16/13 1830 10/17/13 0246  NA 140 141  K 4.3 4.5  CL 97 103  CO2 26 26  GLUCOSE 133* 156*  BUN 11 10  CREATININE 0.66 0.61  CALCIUM 9.3 8.8   Liver Function Tests:  Recent Labs Lab 10/16/13 1830  AST 28  ALT 16  ALKPHOS 102  BILITOT 1.0  PROT 7.6  ALBUMIN 3.8   No results found for this basename: LIPASE, AMYLASE,  in the last 168 hours No results found for this basename: AMMONIA,  in the last 168 hours CBC:  Recent Labs Lab 10/16/13 1830 10/17/13 0246  WBC 15.5*  14.5*  HGB 12.5* 11.5*  HCT 38.4* 35.7*  MCV 87.3 87.3  PLT 241 209   Cardiac Enzymes:  Recent Labs Lab 10/16/13 1830 10/16/13 2213 10/17/13 0246 10/17/13 0930  CKTOTAL 678* 2634*  --   --   TROPONINI  --  <0.30 <0.30 <0.30   BNP (last 3 results) No results found for this basename: PROBNP,  in the last 8760 hours CBG:  Recent Labs Lab 10/16/13 1841 10/16/13 2217 10/17/13 0640  GLUCAP 127* 138* 111*    No results found for this or any previous visit (from the past 240 hour(s)).   Studies: Dg Chest 2 View  10/16/2013   CLINICAL DATA:  Altered mental status.  EXAM: CHEST  2 VIEW  COMPARISON:  02/21/2012  FINDINGS: Heart size and pulmonary vascularity are normal. Lungs are hyperinflated with chronic accentuation of the interstitial markings. Spinal cord stimulator in place. No acute osseous abnormality.  IMPRESSION: No acute abnormality.  Chronic lung disease.   Electronically Signed   By: Rozetta Nunnery M.D.   On: 10/16/2013 20:04   Ct Head Wo Contrast  10/16/2013   CLINICAL DATA:  Altered mental status. The patient was found down. He states that he fell.  EXAM: CT HEAD WITHOUT CONTRAST  CT CERVICAL SPINE WITHOUT CONTRAST  TECHNIQUE: Multidetector CT imaging of the head and cervical spine  was performed following the standard protocol without intravenous contrast. Multiplanar CT image reconstructions of the cervical spine were also generated.  COMPARISON:  None.  FINDINGS: CT HEAD FINDINGS  There is no acute intracranial hemorrhage, infarction, or mass lesion. There is diffuse moderate cerebellar atrophy and slight cerebral cortical atrophy. No ventricular dilatation. No osseous abnormality.  CT CERVICAL SPINE FINDINGS  There is no fracture or acute subluxation. Previous anterior fusion at C3-4. Chronic central calcified disc protrusions at C2-3 and at C4-5, unchanged. No prevertebral soft tissue swelling. 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  IMPRESSION: 1. No acute intracranial  abnormality.  Atrophy. 2. No acute abnormality of the cervical spine.   Electronically Signed   By: Rozetta Nunnery M.D.   On: 10/16/2013 19:44   Ct Cervical Spine Wo Contrast  10/16/2013   CLINICAL DATA:  Altered mental status. The patient was found down. He states that he fell.  EXAM: CT HEAD WITHOUT CONTRAST  CT CERVICAL SPINE WITHOUT CONTRAST  TECHNIQUE: Multidetector CT imaging of the head and cervical spine was performed following the standard protocol without intravenous contrast. Multiplanar CT image reconstructions of the cervical spine were also generated.  COMPARISON:  None.  FINDINGS: CT HEAD FINDINGS  There is no acute intracranial hemorrhage, infarction, or mass lesion. There is diffuse moderate cerebellar atrophy and slight cerebral cortical atrophy. No ventricular dilatation. No osseous abnormality.  CT CERVICAL SPINE FINDINGS  There is no fracture or acute subluxation. Previous anterior fusion at C3-4. Chronic central calcified disc protrusions at C2-3 and at C4-5, unchanged. No prevertebral soft tissue swelling. 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  IMPRESSION: 1. No acute intracranial abnormality.  Atrophy. 2. No acute abnormality of the cervical spine.   Electronically Signed   By: Rozetta Nunnery M.D.   On: 10/16/2013 19:44    Scheduled Meds: . aspirin  81 mg Oral Daily  . cloNIDine  0.1 mg Oral BID  . DULoxetine  60 mg Oral Daily  . enoxaparin (LOVENOX) injection  40 mg Subcutaneous Q24H  . insulin aspart  0-5 Units Subcutaneous QHS  . insulin aspart  0-9 Units Subcutaneous TID WC  . pregabalin  150 mg Oral BID  . sodium chloride  3 mL Intravenous Q12H  . tamsulosin  0.4 mg Oral Daily   Continuous Infusions: . sodium chloride 75 mL/hr at 10/17/13 1021    Time spent: 35 minutes  [facility] Pager [phone]. If 7PM-7AM, please contact night-coverage at [email or web address], password [email or web address] 10/17/2013, 10:47 AM  LOS: 1 day

## 2013-10-17 NOTE — Evaluation (Signed)
Physical Therapy Evaluation Patient Details Name: James Parsons MRN: [id number] DOB: [birth year] Today's Date: 10/17/2013   History of Present Illness  Pt is a 68 y/o male with chronic pain in back and LE's, who fell while walking outside and was unable to get up. Pt was in the driveway for many hours in the afternoon before being found by a neighbor. Pt admitted with hyperthermia and rhabdomyolitis.   Clinical Impression  Pt admitted with the above. Pt currently with functional limitations due to the deficits listed below (see PT Problem List). At the time of PT eval pt required assist for balance and safety throughout session. Recommended use of RW until pt returns to baseline. Pt will benefit from skilled PT to increase their independence and safety with mobility to allow discharge to the venue listed below.     Follow Up Recommendations Home health PT;Supervision for mobility/OOB    Equipment Recommendations  None recommended by PT    Recommendations for Other Services       Precautions / Restrictions Precautions Precautions: Fall Restrictions Weight Bearing Restrictions: No      Mobility  Bed Mobility Overal bed mobility: Needs Assistance Bed Mobility: Supine to Sit     Supine to sit: Supervision     General bed mobility comments: Pt able to transition to EOB with heavy use of bed rails and supervision for safety.   Transfers Overall transfer level: Needs assistance Equipment used: Rolling walker (2 wheeled) Transfers: Sit to/from Stand Sit to Stand: Min assist         General transfer comment: VC's for hand placement on seated surface for safety. Pt able to stand to RW with min assist to power-up as well as for balance.   Ambulation/Gait Ambulation/Gait assistance: Min assist Ambulation Distance (Feet): 150 Feet Assistive device: Rolling walker (2 wheeled) Gait Pattern/deviations: Step-through pattern;Decreased stride length;Trendelenburg Gait velocity:  Decreased Gait velocity interpretation: Below normal speed for age/gender General Gait Details: Pt somewhat impulsive and unsteady throughout gait training. Recommended use of RW instead of the cane for safety until pt improves.   Stairs            Wheelchair Mobility    Modified Rankin (Stroke Patients Only)       Balance Overall balance assessment: Needs assistance;History of Falls Sitting-balance support: Feet supported;No upper extremity supported Sitting balance-Leahy Scale: Fair     Standing balance support: Bilateral upper extremity supported;During functional activity Standing balance-Leahy Scale: Fair Standing balance comment: Occasional assist required for unsteadiness during dynamic standing.                              Pertinent Vitals/Pain Vitals stable throughout session.     Home Living Family/patient expects to be discharged to:: Private residence Living Arrangements: Spouse/significant other Available Help at Discharge: Family;Friend(s);Available PRN/intermittently Type of Home: House Home Access: Stairs to enter Entrance Stairs-Rails: None Entrance Stairs-Number of Steps: 3 Home Layout: One level Home Equipment: Cane - single point;Walker - 2 wheels;Cane - quad;Bedside commode      Prior Function Level of Independence: Independent with assistive device(s)         Comments: Walk-in-shower, uses the cane all the time. Independent with bathing and dressing. Still driving, can do shopping with a cart to hold on to.      Hand Dominance   Dominant Hand: Right    Extremity/Trunk Assessment   Upper Extremity Assessment: Defer to OT evaluation  Lower Extremity Assessment: Generalized weakness      Cervical / Trunk Assessment: Normal  Communication   Communication: No difficulties  Cognition Arousal/Alertness: Awake/alert Behavior During Therapy: WFL for tasks assessed/performed Overall Cognitive Status: Within  Functional Limits for tasks assessed                      General Comments      Exercises        Assessment/Plan    PT Assessment Patient needs continued PT services  PT Diagnosis Difficulty walking;Generalized weakness   PT Problem List Decreased strength;Decreased range of motion;Decreased activity tolerance;Decreased balance;Decreased mobility;Decreased knowledge of use of DME;Decreased safety awareness;Decreased knowledge of precautions  PT Treatment Interventions DME instruction;Gait training;Stair training;Functional mobility training;Therapeutic activities;Therapeutic exercise;Neuromuscular re-education;Patient/family education   PT Goals (Current goals can be found in the Care Plan section) Acute Rehab PT Goals Patient Stated Goal: To return home PT Goal Formulation: With patient Time For Goal Achievement: 10/31/13 Potential to Achieve Goals: Good    Frequency Min 3X/week   Barriers to discharge Decreased caregiver support Pt home alone during the day while wife works    Co-evaluation               End of [place] During Treatment: Gait belt Activity Tolerance: Patient tolerated treatment well Patient left: in chair;with call bell/phone within reach;with family/visitor present Nurse Communication: Mobility status         Time: 5056-9794 PT Time Calculation (min): 34 min   Charges:   PT Evaluation $Initial PT Evaluation Tier I: 1 Procedure PT Treatments $Gait Training: 8-22 mins $Therapeutic Activity: 8-22 mins   PT G CodesJolyn Lent 10/17/2013, 1:56 PM  Jolyn Lent, PT, DPT Acute Rehabilitation Services Pager: [phone]

## 2013-10-18 LAB — CK: Total CK: 1139 U/L — ABNORMAL HIGH (ref 7–232)

## 2013-10-18 LAB — GLUCOSE, CAPILLARY: Glucose-Capillary: 118 mg/dL — ABNORMAL HIGH (ref 70–99)

## 2013-10-18 NOTE — Discharge Summary (Signed)
Physician Discharge Summary  James Parsons EHU:[id number] DOB: Oct 09, 1945 DOA: 10/16/2013  PCP: Alesia Richards, MD  Admit date: 10/16/2013 Discharge date: 10/18/2013  Time spent: greater than 30 minutes  Recommendations for Outpatient Follow-up:  1. Statin held. If resumed, please monitor cpks 2. Home PT arranged  Discharge Diagnoses:  Principal Problem:   Heat exhaustion Active Problems:   HYPERLIPIDEMIA   Hypertension   COPD   Chronic pain syndrome DM2   Rhabdomyolysis   Discharge Condition: stable  Filed Weights   10/16/13 2211 10/17/13 0524 10/18/13 0500  Weight: 96.979 kg (213 lb 12.8 oz) 97.569 kg (215 lb 1.6 oz) 95.754 kg (211 lb 1.6 oz)    History of present illness:  68 y.o. male with Multiple Medical Problems including COPD, CHF, DM2 and Chronic Pain who walks with a cane and he was outside in his yard walking when his cane slipped on the gravel in the driveway, and he fell onto the ground. He was not able to get up and lay on his driveway for the next 2 hours until he was found. He was brought to the ED and evaluated and found to have a CPK level of 678 and a temperature of 103.1 and a sinus Tachycardia. He was referred for admission.   Hospital Course:  Admitted to telemetry. Infectious workup negative. Hyperthermia and mild rhabdomyolysis likely heat stroke. No further fevers during hospitalization Patient was hydrated and statin stopped.  CPK peaked at 2600.  Patient worked with PT and home PT will be arranged  Procedures:  none  Consultations:  none  Discharge Exam: Filed Vitals:   10/18/13 1009  BP: 147/74  Pulse: 85  Temp: 97.9 F (36.6 C)  Resp: 18    General: comfortable Cardiovascular: RRR Respiratory: CTA Ext no CCE  Discharge Instructions   Diet - low sodium heart healthy    Complete by:  As directed      Discharge instructions    Complete by:  As directed   Drink plenty of fluids. Stop atorvastatin.     Increase activity  slowly    Complete by:  As directed             Medication List    STOP taking these medications       atorvastatin 80 MG tablet  Commonly known as:  LIPITOR      TAKE these medications       aspirin 81 MG chewable tablet  Chew 81 mg by mouth daily.     celecoxib 200 MG capsule  Commonly known as:  CELEBREX  Take 200 mg by mouth 2 (two) times daily.     cloNIDine 0.1 MG tablet  Commonly known as:  CATAPRES  Take 1 tablet (0.1 mg total) by mouth 2 (two) times daily.     cyclobenzaprine 10 MG tablet  Commonly known as:  FLEXERIL  Take 10 mg by mouth 2 (two) times daily as needed. Muscle spasm.     DULoxetine 60 MG capsule  Commonly known as:  CYMBALTA  Take 60 mg by mouth daily.     ENDOCET 10-325 MG per tablet  Generic drug:  oxyCODONE-acetaminophen  Take 1 tablet by mouth 3 (three) times daily as needed. pain     fentaNYL 0.05 MG/ML SOLN 5,000 mcg  Inject into the skin continuous. Patient will bring dosage instructions with him. Daily dose 0.84mg      LYRICA 150 MG capsule  Generic drug:  pregabalin  Take 150 mg by mouth  2 (two) times daily.     metFORMIN 500 MG tablet  Commonly known as:  GLUCOPHAGE  Take 500 mg by mouth 2 (two) times daily with a meal.     sitaGLIPtin 100 MG tablet  Commonly known as:  JANUVIA  Take 50 mg by mouth daily.     tamsulosin 0.4 MG Caps capsule  Commonly known as:  FLOMAX  Take 0.4 mg by mouth daily.     traMADol 50 MG tablet  Commonly known as:  ULTRAM  Take 50 mg by mouth 3 (three) times daily as needed. pain       Allergies  Allergen Reactions  . Codeine Itching  . Morphine And Related Other (See Comments)    hallucinations       Follow-up Information   Follow up with Alesia Richards, MD In 2 weeks. (to check cpk)    Specialty:  Internal Medicine   Contact information:   [address] [place] [address] [phone]        The results of significant diagnostics from this  hospitalization (including imaging, microbiology, ancillary and laboratory) are listed below for reference.    Significant Diagnostic Studies: Dg Chest 2 View  10/16/2013   CLINICAL DATA:  Altered mental status.  EXAM: CHEST  2 VIEW  COMPARISON:  02/21/2012  FINDINGS: Heart size and pulmonary vascularity are normal. Lungs are hyperinflated with chronic accentuation of the interstitial markings. Spinal cord stimulator in place. No acute osseous abnormality.  IMPRESSION: No acute abnormality.  Chronic lung disease.   Electronically Signed   By: Rozetta Nunnery M.D.   On: 10/16/2013 20:04   Ct Head Wo Contrast  10/16/2013   CLINICAL DATA:  Altered mental status. The patient was found down. He states that he fell.  EXAM: CT HEAD WITHOUT CONTRAST  CT CERVICAL SPINE WITHOUT CONTRAST  TECHNIQUE: Multidetector CT imaging of the head and cervical spine was performed following the standard protocol without intravenous contrast. Multiplanar CT image reconstructions of the cervical spine were also generated.  COMPARISON:  None.  FINDINGS: CT HEAD FINDINGS  There is no acute intracranial hemorrhage, infarction, or mass lesion. There is diffuse moderate cerebellar atrophy and slight cerebral cortical atrophy. No ventricular dilatation. No osseous abnormality.  CT CERVICAL SPINE FINDINGS  There is no fracture or acute subluxation. Previous anterior fusion at C3-4. Chronic central calcified disc protrusions at C2-3 and at C4-5, unchanged. No prevertebral soft tissue swelling. 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  IMPRESSION: 1. No acute intracranial abnormality.  Atrophy. 2. No acute abnormality of the cervical spine.   Electronically Signed   By: Rozetta Nunnery M.D.   On: 10/16/2013 19:44   Ct Cervical Spine Wo Contrast  10/16/2013   CLINICAL DATA:  Altered mental status. The patient was found down. He states that he fell.  EXAM: CT HEAD WITHOUT CONTRAST  CT CERVICAL SPINE WITHOUT CONTRAST  TECHNIQUE: Multidetector CT imaging of  the head and cervical spine was performed following the standard protocol without intravenous contrast. Multiplanar CT image reconstructions of the cervical spine were also generated.  COMPARISON:  None.  FINDINGS: CT HEAD FINDINGS  There is no acute intracranial hemorrhage, infarction, or mass lesion. There is diffuse moderate cerebellar atrophy and slight cerebral cortical atrophy. No ventricular dilatation. No osseous abnormality.  CT CERVICAL SPINE FINDINGS  There is no fracture or acute subluxation. Previous anterior fusion at C3-4. Chronic central calcified disc protrusions at C2-3 and at C4-5, unchanged. No prevertebral soft  tissue swelling. 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  IMPRESSION: 1. No acute intracranial abnormality.  Atrophy. 2. No acute abnormality of the cervical spine.   Electronically Signed   By: Rozetta Nunnery M.D.   On: 10/16/2013 19:44    Microbiology: Recent Results (from the past 240 hour(s))  CULTURE, BLOOD (ROUTINE X 2)     Status: None   Collection Time    10/16/13  6:30 PM      Result Value Ref Range Status   Specimen Description BLOOD RIGHT HAND   Final   Special Requests BOTTLES DRAWN AEROBIC AND ANAEROBIC 10CC EACH   Final   Culture  Setup Time     Final   Value: 10/17/2013 00:43     Performed at [facility]   Culture     Final   Value:        BLOOD CULTURE RECEIVED NO GROWTH TO DATE CULTURE WILL BE HELD FOR 5 DAYS BEFORE ISSUING A FINAL NEGATIVE REPORT     Performed at [facility]   Report Status PENDING   Incomplete  CULTURE, BLOOD (ROUTINE X 2)     Status: None   Collection Time    10/16/13  8:58 PM      Result Value Ref Range Status   Specimen Description BLOOD ARM RIGHT   Final   Special Requests BOTTLES DRAWN AEROBIC AND ANAEROBIC [place]   Final   Culture  Setup Time     Final   Value: 10/17/2013 00:43     Performed at [facility]   Culture     Final   Value:        BLOOD CULTURE RECEIVED NO GROWTH TO DATE CULTURE WILL BE  HELD FOR 5 DAYS BEFORE ISSUING A FINAL NEGATIVE REPORT     Performed at [facility]   Report Status PENDING   Incomplete     Labs: Basic Metabolic Panel:  Recent Labs Lab 10/16/13 1830 10/17/13 0246  NA 140 141  K 4.3 4.5  CL 97 103  CO2 26 26  GLUCOSE 133* 156*  BUN 11 10  CREATININE 0.66 0.61  CALCIUM 9.3 8.8   Liver Function Tests:  Recent Labs Lab 10/16/13 1830  AST 28  ALT 16  ALKPHOS 102  BILITOT 1.0  PROT 7.6  ALBUMIN 3.8   No results found for this basename: LIPASE, AMYLASE,  in the last 168 hours No results found for this basename: AMMONIA,  in the last 168 hours CBC:  Recent Labs Lab 10/16/13 1830 10/17/13 0246  WBC 15.5* 14.5*  HGB 12.5* 11.5*  HCT 38.4* 35.7*  MCV 87.3 87.3  PLT 241 209   Cardiac Enzymes:  Recent Labs Lab 10/16/13 1830 10/16/13 2213 10/17/13 0246 10/17/13 0930 10/18/13 0516  CKTOTAL 678* 2634*  --   --  1139*  TROPONINI  --  <0.30 <0.30 <0.30  --    BNP: BNP (last 3 results) No results found for this basename: PROBNP,  in the last 8760 hours CBG:  Recent Labs Lab 10/17/13 0640 10/17/13 1104 10/17/13 1611 10/17/13 2036 10/18/13 0617  GLUCAP 111* 163* 124* 138* 118*       Signed:  Larsen Dungan L  [facility] 10/18/2013, 11:58 AM

## 2013-10-18 NOTE — Care Management Note (Addendum)
  Page 2 of 2   10/18/2013     12:41:04 PM CARE MANAGEMENT NOTE 10/18/2013  Patient:  MALCOME, AMBROCIO   Account Number:  [id number]  Date Initiated:  10/18/2013  Documentation initiated by:  Tarun Patchell  Subjective/Objective Assessment:   altered mental status, chronic pain     Action/Plan:   CM to follow for disposition needs   Anticipated DC Date:  10/19/2013   Anticipated DC Plan:  [place]  CM consult      [facility] Choice  HOME HEALTH   Choice offered to / List presented to:  C-1 Patient        [place] arranged  HH-1 RN  [place]      [place].   Status of service:  Completed, signed off Medicare Important Message given?  NA - LOS <3 / Initial given by admissions (If response is "NO", the following Medicare IM given date fields will be blank) Date Medicare IM given:   Medicare IM given by:   Date Additional Medicare IM given:   Additional Medicare IM given by:    Discharge Disposition:  [place]  Per UR Regulation:  Reviewed for med. necessity/level of care/duration of stay  If discussed at [place] of Stay Meetings, dates discussed:    Comments:  Sallye Lunz RN, BSN, MSHL, CCM  Nurse - Case Manager,  ([facility] [place])  [phone]  10/18/2013 Social:  From home PT RECS:  [place]:  Home with HHS:  RN, PT services.

## 2013-10-18 NOTE — ED Provider Notes (Signed)
I saw and evaluated the patient, reviewed the resident's note and I agree with the findings and plan.  Patient is a 68 year old male with history of DM and chronic low back pain.  He presents for evaluation after being found outside by his brother unresponsive in the yard.  The patient believes he may have become dehydrated working in the yard.  He was initially confused with slurred speech.  This is improving but he is not back to his baseline.    On exam, vitals are stable and he is febrile.  Head is AT, [place].  Neck is supple.  Heart is RRR, lungs are clear.  Abd is benign.  Neurologically, cranial nerves and intact and he moves all extremities.  Speech is somewhat slurred, however is responding appropriately.    Workup reveals no significant laboratory abnormality other than a wbc of 15.  Head and c-spine ct's are unremarkable.  EKG is unchanged.  He was hydrated with ns and his neurologic status has returned to baseline.  I suspect heat-related illness, however feel as though admission for observation is indicated.     EKG Interpretation   Date/Time:  Wednesday October 16 2013 18:15:55 EDT Ventricular Rate:  123 PR Interval:  161 QRS Duration: 98 QT Interval:  343 QTC Calculation: 491 R Axis:   73 Text Interpretation:  Sinus tachycardia Borderline T wave abnormalities  Borderline prolonged QT interval Confirmed by Beau Fanny  MD, Leelynn Whetsel ([id number])  on 10/16/2013 6:38:37 PM        Veryl Speak, MD 10/18/13 1128

## 2013-10-18 NOTE — Progress Notes (Signed)
0700-1500 shift. Patient is A/Ox4 and is ambulatory with standby assist. He had no c/o pain during the shift and no signs of distress. Pt.'s wife remained at the bedside throughout the day. Discharged paperwork was discussed and IV's were discontinued. Pt.was transported by wheelchair to private vehicle accompanied by his wife and NT.

## 2013-10-20 DIAGNOSIS — G894 Chronic pain syndrome: Secondary | ICD-10-CM

## 2013-10-20 DIAGNOSIS — E119 Type 2 diabetes mellitus without complications: Secondary | ICD-10-CM

## 2013-10-20 DIAGNOSIS — M47812 Spondylosis without myelopathy or radiculopathy, cervical region: Secondary | ICD-10-CM

## 2013-10-20 DIAGNOSIS — IMO0002 Reserved for concepts with insufficient information to code with codable children: Secondary | ICD-10-CM

## 2013-10-20 DIAGNOSIS — I10 Essential (primary) hypertension: Secondary | ICD-10-CM

## 2013-10-20 DIAGNOSIS — M1710 Unilateral primary osteoarthritis, unspecified knee: Secondary | ICD-10-CM

## 2013-10-20 DIAGNOSIS — I872 Venous insufficiency (chronic) (peripheral): Secondary | ICD-10-CM

## 2013-10-21 ENCOUNTER — Telehealth: Payer: Self-pay | Admitting: *Deleted

## 2013-10-21 NOTE — Telephone Encounter (Signed)
Bess Kinds from [facility] called.  Patient was seen for a nursing evaluation and they will visit him 1 more time.  Per the hospital discharge, patient will be evaluated by physical therapy.

## 2013-10-22 ENCOUNTER — Encounter

## 2013-10-23 ENCOUNTER — Telehealth: Payer: Self-pay | Admitting: *Deleted

## 2013-10-23 LAB — CULTURE, BLOOD (ROUTINE X 2)
CULTURE: NO GROWTH
Culture: NO GROWTH

## 2013-10-23 NOTE — Telephone Encounter (Signed)
[facility] called regarding PT for patient on a schedule of 3 times a week x 3 weeks, 2 times a week x 2 weeks, and 1 time a week for 2 week.  OK per Dr [name].

## 2013-10-24 DIAGNOSIS — Z9689 Presence of other specified functional implants: Secondary | ICD-10-CM | POA: Insufficient documentation

## 2013-10-24 DIAGNOSIS — G894 Chronic pain syndrome: Secondary | ICD-10-CM | POA: Insufficient documentation

## 2013-10-31 ENCOUNTER — Ambulatory Visit (INDEPENDENT_AMBULATORY_CARE_PROVIDER_SITE_OTHER): Payer: Medicare PPO | Admitting: Internal Medicine

## 2013-10-31 ENCOUNTER — Encounter: Payer: Self-pay | Admitting: Internal Medicine

## 2013-10-31 VITALS — BP 138/80 | HR 92 | Temp 98.6°F | Resp 16 | Ht 67.75 in | Wt 222.0 lb

## 2013-10-31 DIAGNOSIS — IMO0001 Reserved for inherently not codable concepts without codable children: Secondary | ICD-10-CM

## 2013-10-31 DIAGNOSIS — Z79899 Other long term (current) drug therapy: Secondary | ICD-10-CM

## 2013-10-31 DIAGNOSIS — T7589XS Other specified effects of external causes, sequela: Secondary | ICD-10-CM

## 2013-10-31 DIAGNOSIS — T6701XS Heatstroke and sunstroke, sequela: Secondary | ICD-10-CM

## 2013-10-31 LAB — CBC WITH DIFFERENTIAL/PLATELET
Basophils Absolute: 0 10*3/uL (ref 0.0–0.1)
Basophils Relative: 0 % (ref 0–1)
EOS ABS: 0.2 10*3/uL (ref 0.0–0.7)
EOS PCT: 2 % (ref 0–5)
HCT: 36.3 % — ABNORMAL LOW (ref 39.0–52.0)
Hemoglobin: 11.9 g/dL — ABNORMAL LOW (ref 13.0–17.0)
LYMPHS ABS: 2.2 10*3/uL (ref 0.7–4.0)
Lymphocytes Relative: 26 % (ref 12–46)
MCH: 27.9 pg (ref 26.0–34.0)
MCHC: 32.8 g/dL (ref 30.0–36.0)
MCV: 85 fL (ref 78.0–100.0)
Monocytes Absolute: 0.3 10*3/uL (ref 0.1–1.0)
Monocytes Relative: 4 % (ref 3–12)
NEUTROS PCT: 68 % (ref 43–77)
Neutro Abs: 5.8 10*3/uL (ref 1.7–7.7)
Platelets: 250 10*3/uL (ref 150–400)
RBC: 4.27 MIL/uL (ref 4.22–5.81)
RDW: 15.6 % — ABNORMAL HIGH (ref 11.5–15.5)
WBC: 8.6 10*3/uL (ref 4.0–10.5)

## 2013-10-31 NOTE — Patient Instructions (Signed)
Heat Stress Elderly people (people aged 68 years and older) are more prone to heat stress than younger people for several reasons:   Elderly people do not adjust as well as young people to sudden changes in temperature.  They are more likely to have a chronic medical condition that upsets normal body responses to heat.  They are more likely to take prescription medicines that impair the body's ability to regulate its temperature or that inhibit perspiration. HEAT STROKE  Heat stroke is the most serious heat-related illness. It occurs when the body becomes unable to control its temperature. The body temperature rises rapidly. Then the body loses its ability to sweat and is unable to cool down. The body temperature rises to 105 F (40.6 C) or higher within 10 to 15 minutes. Heat stroke can cause death or permanent disability if emergency treatment is not provided. SYMPTOMS  Warning signs vary but may include the following:  An extremely high body temperature (above 103 F (39.4 C)).  Nausea.  Red, hot, and dry skin (no sweating).  Rapid, strong pulse.  Throbbing headache.  Dizziness. HEAT EXHAUSTION  Heat exhaustion is a milder form of heat-related illness. It can develop after several days of exposure to high temperatures and not enough fluids. SYMPTOMS  Warning signs vary but may include the following:   Heavy sweating. Paleness.  Muscle cramps.  Tiredness. Weakness.  Dizziness.  Headache. Nausea or vomiting.  Fainting.  Skin: may be cool and moist.  Pulse rate: fast and weak.  Breathing: fast and shallow. WHAT YOU CAN DO TO PROTECT YOURSELF  You can follow these prevention tips to protect yourself from heat-related stress:   Drink cool, nonalcoholic, non-caffeinated beverages. If your caregiver generally limits the amount of fluid you drink or has you on water pills, ask how much you should drink when the weather is hot. Avoid extremely cold liquids. They can cause  cramps.  Rest.  Take a cool shower, bath, or sponge bath.  If possible, seek an air-conditioned environment. If you do not have air conditioning, visit an air-conditioned shopping mall or [place] to cool off.  Emergency planning/management officer.  If possible, remain indoors in the heat of the day.  Do not engage in strenuous activities. WHAT YOU CAN DO TO HELP PROTECT ELDERLY RELATIVES AND NEIGHBORS  If you have elderly relatives or neighbors, help them protect themselves from heat-related stress.   Visit older adults at risk at least twice a day. Watch them for signs of heat exhaustion or heat stroke.  Take them to air-conditioned locations if they have transportation problems.  Make sure older adults have access to an electric fan whenever possible. WHAT YOU CAN DO FOR SOMEONE WITH HEAT STRESS   If you see any signs of severe heat stress, you may be dealing with a life-threatening emergency. Have someone call for immediate medical assistance while you begin cooling the affected person. Do the following:  Get the person to a shady area.  Cool the person rapidly, using whatever methods you can. For example, immerse the person in a tub of cool water or place the person in a cool shower. Spray the person with cool water from a garden hose or sponge the person with cool water. If the humidity is low, wrap the person in a cool, wet sheet. Fan him/her quickly.  Monitor body temperature. Continue cooling efforts until the body temperature drops to 101 - 102F (38.3  C - 38.9  C).  If emergency  medical personnel are delayed, call the hospital emergency room for further instructions.  Do not give the person alcohol to drink.  Get medical care as soon as possible. Document Released: 03/16/2009 Document Revised: 06/20/2011 Document Reviewed: 03/16/2009 [facility] Patient Information 2015 [place], [place]. This information is not intended to replace advice given to you by your health care  provider. Make sure you discuss any questions you have with your health care provider.

## 2013-10-31 NOTE — Progress Notes (Signed)
Subjective:    Patient ID: James Parsons, male    DOB: 12-Apr-1945, 68 y.o.   MRN: [id number]  HPI Patient presents for Hospital f/u from July 7-9 for heat stroke and rhabdomyolysis after falling unattended in his yard on a very hot day . He apparently recovered quickly in hospital with IVF.  Medication Sig  . aspirin 81 MG chewable tablet Chew 81 mg by mouth daily.   . celecoxib  200 MG capsule Take 200 mg by mouth 2 (two) times daily.    . cloNIDine  0.1 MG tablet Take 1 tablet (0.1 mg total) by mouth 2 (two) times daily.  . cyclobenzaprine  10 MG tablet Take 10 mg by mouth 2 (two) times daily as needed. Muscle spasm.  . CYMBALTA 60 MG capsule Take 60 mg by mouth daily.   . ENDOCET 10-325 MG per tablet Take 1 tablet by mouth 3 (three) times daily as needed. pain  . fentaNYL 0.05 MG/ML SOLN 5,000 mcg Inject into the skin continuous. Patient will bring dosage instructions with him. Daily dose 0.84mg   . LYRICA 150 MG capsule Take 150 mg by mouth 2 (two) times daily.   . metFORMIN (GLUCOPHAGE) 500 MG tablet Take 500 mg by mouth 2 (two) times daily with a meal.   . JANUVIA 100 MG tablet Take 50 mg by mouth daily.   . tamsulosin 0.4 MG CAPS capsule Take 0.4 mg by mouth daily.   . traMADol (ULTRAM) 50 MG tablet Take 50 mg by mouth 3 (three) times daily as needed. pain   Allergies  Allergen Reactions  . Codeine Itching  . Morphine And Related Other (See Comments)    hallucinations   Past Medical History  Diagnosis Date  . Anemia   . Arthritis   . Blood transfusion   . Clotting disorder   . Diabetes mellitus   . COPD (chronic obstructive pulmonary disease)   . Borderline hypertension   . Venous insufficiency   . Hyperlipidemia   . Obesity   . Diverticulosis of colon   . Hx of colonic polyps   . Low back pain   . Lipoma   . RSD (reflex sympathetic dystrophy)   . Pneumonia   . Mental disorder   . Depression     hx of       Review of Systems In addition to the HPI above,   No Fever-chills,  No Headache, No changes with Vision or hearing,  No problems swallowing food or Liquids,  No Chest pain or productive Cough or Shortness of Breath,  No Abdominal pain, No Nausea or Vomitting, Bowel movements are regular,  No Blood in stool or Urine,  No dysuria,  No new skin rashes or bruises,   No new weakness, tingling, numbness in any extremity,  A full 10 point Review of Systems was done, except as stated above, all other Review of Systems were negative  Objective:   Physical Exam  BP 138/80  Pulse 92  Temp(Src) 98.6 F (37 C) (Temporal)  Resp 16  Ht 5' 7.75" (1.721 m)  Wt 222 lb (100.699 kg)  BMI 34.00 kg/m2  HEENT - Eac's patent. TM's Nl. EOM's full. PERRLA. NasoOroPharynx clear. Neck - supple. Nl Thyroid. Carotids 2+ & No bruits, nodes, JVD Chest - Clear equal BS w/o Rales, rhonchi, wheezes. Cor - Nl HS. RRR w/o sig MGR. PP 1(+). No edema. Abd - No palpable tenderness. BS nl. MS- FROM w/o deformities. Muscle power, tone and bulk  Nl. Gait Nl. Neuro - No obvious Cr N abnormalities. Sensory, motor and Cerebellar functions appear Nl w/o focal abnormalities. Psyche - Mental status normal & appropriate.  No delusions, ideations or obvious mood abnormalities.  Assessment & Plan:   1. Heat stroke, sequela  2. Myalgia and myositis - CPK - BASIC METABOLIC PANEL WITH GFR - CBC with Differential

## 2013-11-01 LAB — BASIC METABOLIC PANEL WITH GFR
BUN: 10 mg/dL (ref 6–23)
CHLORIDE: 101 meq/L (ref 96–112)
CO2: 32 mEq/L (ref 19–32)
CREATININE: 0.66 mg/dL (ref 0.50–1.35)
Calcium: 9.4 mg/dL (ref 8.4–10.5)
GFR, Est Non African American: >89 mL/min
GLUCOSE: 176 mg/dL — AB (ref 70–99)
POTASSIUM: 5 meq/L (ref 3.5–5.3)
Sodium: 142 mEq/L (ref 135–145)

## 2013-11-01 LAB — CK: Total CK: 46 U/L (ref 7–232)

## 2013-11-08 ENCOUNTER — Other Ambulatory Visit: Payer: Self-pay | Admitting: Pulmonary Disease

## 2013-11-11 ENCOUNTER — Other Ambulatory Visit: Payer: Self-pay | Admitting: *Deleted

## 2013-11-11 MED ORDER — METFORMIN HCL 500 MG PO TABS: ORAL_TABLET | ORAL | Status: DC

## 2013-11-16 ENCOUNTER — Other Ambulatory Visit: Payer: Self-pay | Admitting: Pulmonary Disease

## 2013-11-21 ENCOUNTER — Other Ambulatory Visit: Payer: Self-pay | Admitting: *Deleted

## 2013-11-21 MED ORDER — SITAGLIPTIN PHOSPHATE 100 MG PO TABS: 100.0000 mg | ORAL_TABLET | Freq: Every day | ORAL | Status: DC

## 2013-12-03 ENCOUNTER — Other Ambulatory Visit: Payer: Self-pay | Admitting: Pulmonary Disease

## 2013-12-23 NOTE — Progress Notes (Signed)
Subjective:     Patient ID: James Parsons, male   DOB: [birth year], 68 y.o.   MRN: [id number]  HPI 68 y/o WM here for a follow up visit... he has multiple medical problems including COPD (former heavy smoker);  HBP;  Hyperlipidemia & DM;  Obesity;  Divertics & Polyps;  LBP, RSD, & chr pain syndrome...  ~  October 18, 2011:  32mo ROV & James Parsons remains in pain all the time every day; still sees DrRauch every month w/ spinal cord stimulator & pain pump under the skin, plus Oxycodone, Flexeril, Celebrex, Tramadol, Lyrica, Cymbalta...  Somehow he has lost 22# down to 242# today "on diet" he says; unfortunately his DM control is no better w/ A1c=10.0 and we will increase his meds further (he doesn't want insulin if it can be avoided...    We reviewed prob list, meds, xrays and labs> see below for updates>>  LABS 7/13:  FLP- at goals on Simva40 x TG=179;  Chems- ok x BS=207 & A1c=10.0.Marland KitchenMarland Kitchen  ~  April 24, 2012:  5mo ROV & James Parsons reports a lot going on over the last few months> he had right TKR by [facility] 11/13;  He reports several recent falls- ?etiology but I wonder about his numerous pain meds- he states pain incr in his right knee & lower back since then;  He continues to follow up w/ DrRauck/North at the pain clinic every month...  He developed an episode of hematuria 04/11/12 & went to the ER w/ +blood & rare wbc- treated w/ Cipro & referred to Urology, seen by DrMacDiarmid 1/14, & pt was to f/u w/ CT Abd and Cystoscopy- follow up note is pending;  We reviewed the following medical problems during today's office visit >>     COPD> ex-smoker, not on inhaled meds; he denies cough, sput, hemoptysis, ch in SOB, etc...    HBP> on Catapress 0.1mg  Bid; BP= 144/80 & he denies CP/angina, palpit, dizzy, ch in SOB, edema...    VI, Hx cellulitis> off prev antibiotics now; he had local infection from laceration w/ a fall...    Hyperlipidemia> on Simva40; last FLP 7/13 showed TChol 154, TG 179, HDL 36, LDL 82    DM> on  Metform500-2Bid, Glimep4, Januv100; labs 1/14 showed BS=247, last A1c was 7/13= 10.0    Obese> wt = 256#, up 14# & we reviewed diet, exercise, wt reduction strategies...    GI- Divertics, Polyps, Constip> he is supposed to be on Miralax & Senakot-S due to his narcotic pain meds; he is overdue for f/u colonoscopy w/ DrPerry...    GU- Hx prostatitis,episode of gross hematuria> outpt evaluation by DrMacdiarmid w/ CT Abd & Cysto- results pending (we don't have f/u notes)...    LBP, Chr pain syndrome> on MULT meds + injections & sp cord stim; followed by [facility] Browns at the [facility] on Fentenyl SQ infusion, Persocet10, OxyIR5, Ultram50, Lyrica150Bid, Celebrex200Bid, Flexeril10Bid, Lidoderm... We do not have notes from the pain doctors & we rely on pt hx for the current list...    Vit D defic> last Vit D level 1/13 was 21 & he is rec to take 2000u OTC VitD daily... We reviewed prob list, meds, xrays and labs> see below for updates >> he had the 2013 flu vaccine 10/13; he also received the Pneumonia vaccine 1/13 at age 5...  LABS 1/14 via ER:  Chems- ok x BS=247 Creat=0.63;  CBC- hg=11.4;  UA- TNTC rbc...  ~  November 02, 2012:  14mo ROV &  James Parsons has lost 15# down to 239# today on diet; still not able to exercise much & walks w/ a cane due to his back & knee pain;  His CC today is a decr in urinary stream, LTOS, nocturia- we discussed trial of Flomax0.4mg Qhs...    BP= 140/90 on Clonidine0.1Bid (primarily prescribed for his RSD pain by [place]); he denies CP, palpit, ch in SOB, edema...    On Simva40 for his lipids; wt down 15# on diet & exercise; FLP today shows TChol 149, TG 163, HDL 30, LDL 86    He remains on Metform500-2Bid, Glimep4, Januv100; BS=255, A1c=9.8 despite the wt reduction; Rec- continue diet/ exerc and incr Glimep4mg Bid, next step is insulin...    His main prob= LBP, chr pain syndrome, DJD; followed by DrRauch pain mamagement on MULT meds> spinal cord stim, Fentenyl SQ infusion, OxyIR,  Endocet, Celebrex, Ultram, Lyrica, Lidoderm, Flexeril, Cymbalta... We reviewed prob list, meds, xrays and labs> see below for updates >>   LABS 7/14:  FLP- Chol ok on Simva40 but elevTG;  Chems- ok x BS=255 A1c=9.8;  CBC- wnl;  TSH=2.07...  ~  May 07, 2013:  71mo ROV & James Parsons is here w/ his wife today & indicates that he "took a turn for the worse" in Dec w/ incr pain & he tells me that workman's comp is re-evaluationg (DrNorth & DrRauch are in charge); wife is concerned about pt's memory loss, odd behaviors, & doesn't think it's related to his chr pain meds from the pain clinic=> I rec referral to Neurology for formal neurological evaluation given her level of concern...    COPD> ex-smoker, not on inhaled meds; he denies cough, sput, hemoptysis, ch in SOB, etc...    HBP> on Catapress 0.1mg  Bid; BP= 142/74 & he denies CP/angina, palpit, dizzy, ch in SOB, edema...    VI, Hx cellulitis> he had local infection from laceration from a fall; he knows to avoid salt, elev legs, wear support hose...    Hyperlipidemia> on Simva40; last FLP 7/14 showed TChol 149, TG 163, HDL 30, LDL 86; we reviewed diet, exercise & wt reduction strategies...    DM> on Metform500-2Bid, Glimep4Bid, Januv100; labs 1/15 show BS=122 A1c=9.1 & we are going to add INKOVANA100mg /d...    Obese> wt = 242#, BMI=38 & we reviewed diet, exercise, & he understands the need to lose the weight.    GI- Divertics, Polyps, Constip> he is supposed to be on Miralax & Senakot-S due to his narcotic pain meds; he had f/u colonoscopy w/ DrPerry 10/14 showing mod divertics, no lesions seen, f/u planned 62yrs...    GU- Hx prostatitis,episode of gross hematuria> outpt evaluation by DrMacdiarmid w/ CT Abd (neg) & Cysto (neg) as reported 1/14 note from Urology; no recurrent bleeding.    LBP, Chr pain syndrome> on MULT meds + injections & sp cord stim; followed by [facility] Browns at the [facility] on Fentenyl SQ infusion, Percocet10, OxyIR5, Ultram50,  Lyrica150Bid, Celebrex200Bid, Flexeril10Bid, Lidoderm... We do not have notes from the pain doctors & we rely on pt hx for the current list... He also sees DrJJenkins and DrRowan...    Vit D defic> last Vit D level 1/13 was 21 & he is rec to take 2000u OTC VitD daily... We reviewed prob list, meds, xrays and labs> see below for updates >> he was given Rx for Shingles vaccine...   LABS 1/15:  Chems- ok x BS=122 A1c=9.1 on 3 meds and we added Inkovana100 to start...   ~  June 19, 2103:  6wk [place] has lost 8# down to 234# w/ BMI=36+ and last OV his BS=122 w/ A1c=9.1 on Metform500-2Bid, Glimep4Bid, Januv100; we added Inkovana100 & he reports that home BS checks have all been 120-200 range;  He tells me that he will be seeing Dr. Marlise Eves for Primary Care;  His other complaint today is his on-going LBP & chr pain syndrome- on MULT meds + injections & sp cord stim; followed by Hoover Browns at the [facility] on [place] SQ infusion, Percocet10, OxyIR5, Ultram50, Lyrica150Bid, Celebrex200Bid, Flexeril10Bid, Lidoderm; he also sees DrJJenkins and DrRowan...  We reviewed prob list, meds, xrays and labs> see below for updates >>   LABS 3/15:  Chems- ok w/ BS=131           Problem List:   COPD (ICD-496) - former 3ppd smoker, quit 2003... He states "my breathing is ok" & denies cough, phlegm, change in SOB, etc; he is sedentary due to chronic back pain... 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 ~  CXR 1/13 showed low lung volumes, mild pleural thickening (extrapleural fat), lungs clear, NAD... ~  PFT 1/13 showed FVC=2.65 (59%), FEV1=1.89 (54%), %1sec=71, mid-flows=35% pred; c/w mild obstructive & superimposed restrictive defect... ~  CXR 11/13 showed mild cardiomeg, clear lungs, neurostim electrodes seen, mild scoliosis & degen changes...  HYPERTENSION, BORDERLINE (ICD-401.9) - his BP has been elev intermittently in the past, but he states just when he is in pain...  DrRauck has him on CLONIDINE 0.1mg  Bid which obviously also helps his pressure... ~  5/11:  BP= 116/70 & he denies HA, visual changes, CP, palipit, syncope, ch in dyspnea, edema, etc... ~  11/11:  BP= 160/90 but he is having considerable pain recently he says & he doesn't want additional meds. ~  1/13:  BP= 132/88 & he denies CP, palpit, ch in SOB, syncope, edema, etc.. ~  7/13:  BP= 142/86 & he remains stable from the CV standpoint, still too sedentary due to his back problems... ~  11/13:  EKG showed NSR, rate93, wnl, NAD... ~  1/14: on Catapress 0.1mg  Bid; BP= 144/80 & he denies CP/angina, palpit, dizzy, ch in SOB, edema... ~  7/14: BP= 140/90 on Clonidine0.1Bid (primarily prescribed for his RSD pain by [facility]); he denies CP, palpit, ch in SOB, edema. ~  1/15: on Catapress 0.1mg  Bid; BP= 142/74 & he denies CP/angina, palpit, dizzy, ch in SOB, edema.  VENOUS INSUFFICIENCY (ICD-459.81) - hx chr venous insuffic and cellulitis in the past; no recent soft tissue infections; he follows a low sodium diet, elevates legs, wears support hose when necessary...  HYPERLIPIDEMIA (ICD-272.4) - supposed to be on SIMVASTATIN 40mg /d, but he has been filling this irregularly (prev on Vytorin 10-40 but he is very erratic at filling that); we discussed low chol/ low fat diet, incr exercise, get weight down! ~  FLP 5/08 showed TChol 190, TG 164, HDL 36, LDL 121... ?taking Vytor10-40? ~  FLP 6/10 showed TChol 224, TG 183, HDL 31, LDL 176... rec> Rx Simva40. ~  [place] 11/10 on Simva40 showed TChol 159, TG 94, HDL 43, LDL 98... continue same. ~  pt stopped filling his perscription for Simva40 ~1/11... ~  FLP 5/11 showed TChol 240, TG 205, HDL 35, LDL 178... needs med> restart Simva40 & stay on it! ~  FLP 11/11 showed TChol 158, TG 181, HDL 32, LDL 90 ~  FLP 1/13 ?on Simva40 showed TChol 186, TG 99, HDL  48, LDL 119... rec to take the Simva40 every night. ~  [place] 7/13 on Simva40 showed TChol 154, TG 179, HDL 36, LDL 82...  Needs better low fat diet & wt reduction. ~  FLP 7/14 on Simva40 showed TChol 149, TG 163, HDL 30, LDL 86   DIABETES MELLITUS (ICD-250.00) >> hx of intermittent dosing & med noncompliance in the past... ~  labs 2/08 showed BS= 107, A1c= 6.4... On Metformin500Bid. ~  labs 5/08 showed BS= 99, A1c= 6.2.Marland KitchenMarland Kitchen Continue same. ~  labs 6/10 showed BS= 127, A1c= 6.6.[name]. rec> take med regularly! ~  labs 11/10 showed BS= 145, A1c= 7.4.[name]. rec> add Glimep 1mg Qam (only took it for 2-33mo).  ~  labs 5/11 showed BS= 118, A1c= 6.8.[name]. if he can lose weight he can avoid more meds... ~  labs 11/11 showed BS= 385, A1c= 10.6.Marland KitchenMarland Kitchen ?what happened? rec> incr Metform500-2Bid, Add Glimep2mg /d. ~  Labs 1/13 showed BS= 137, A1c= 7.7.Marland KitchenMarland Kitchen Pharm confirms poor med compliance; asked to take meds regularly so we can assess efficacy. ~  Labs 7/13 on Metform500-2Bid+Glim2 showed BS= 207, A1c= 10.0.Marland KitchenMarland Kitchen Rec- incr Glim4mg Qam & add JANUVIA 100mg Qpm... ~  Labs 1/14 on 3Meds showed BS=247... ~  7/14: He remains on Metform500-2Bid, Glimep4, Januv100; BS=255, A1c=9.8 despite the wt reduction; Rec- continue diet/ exerc and incr Glimep4mg Bid, next step is insulin.  ~  9/14: he had Optometry eval by DrYoakum> no sign of diabetic retinopathy... ~  1/15: on Metform500-2Bid, Glimep4Bid, Januv100; labs 1/15 show BS=122 A1c=9.1 & we are going to add INKOVANA100mg /d...  OBESITY (ICD-278.00) - he is not dieting/ exercising/ or trying to lose weight in any fashion!!!  We discussed weight reducing diet strategies and exercise program that he should be able to perform. ~  last weight <230# was 1995 @ 226#... range over 15 yrs= 238-278# ~  weight 5/08 = 239# ~  weight 10/08 = 253# ~  weight 6/10 = 278# ~  weight 11/10 = 273# ~  weight 5/11 = 270# ~  weight 11/11 = 262# ~  Weight 1/13 = 264# ~  Weight 7/13 = 242# ~  Weight 1/14 = 256# ~  Weight 7/14 = 239# ~  Weight 1/15 = 242#  DIVERTICULOSIS OF COLON (ICD-562.10),  COLONIC POLYPS (ICD-211.3),   CONSTIPATION (ICD-564.00) - he is instructed to take MIRALAX Bid & SENAKOT-S 2Qhs for his narcotic induced constipation problem... ~  last colonoscopy 1/07 by DrPerry showed divertics, 35mm polyp (not retrieved)... f/u planned 40yrs. ~  1/13 & 7/14:  He is overdue for GI follow up & we will send referral/ reminder to DrPerry's office... ~  he is supposed to be on Miralax & Senakot-S due to his narcotic pain meds; he had f/u colonoscopy w/ DrPerry 10/14 showing mod divertics, no lesions seen, f/u planned 27yrs.  Hx of PROSTATITIS (ICD-601.0) - hx acute prostatitis treated in the 1990's by DrPeterson... HEMATURIA >> episode of gross hematuria 1/14 treated w/ antibiotics & referred to Urology- seen by DrMacDiarmid & w/u in progress... ~  labs 6/10 showed PSA= 0.48 ~  labs 5/11 showed PSA= 0.28 ~  11/11: notes some irritation on penis- Rx Lotrisone cream trial. ~  Labs 1/13 showed PSA= 0.29 ~  1/14: Episode gross hematuria> eval by DrMacDiarmid: CT is reported normal; Cysto was neg x enlarged prostate; given sample of Alpha blocker- pt didn't follow up... ~  7/14: His CC today is a decr in urinary stream, LTOS, nocturia- we discussed trial of Flomax0.4mg Qhs.  OSTEOARTHRITIS >> s/p right  TKR 11/13 by DrRowan... 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 followed by Dicie Beam al at the [facility]: he has a spinal cord stimulator, and subcut pain pump w/ Fentanyl (reaction to MS)...  ~  2/04:  eval by [facility] w/ HNP L3-4 right w/ spinal stenosis, lateral recessed stenosis- had microdiscectomy & decompression. ~  2/08:  Myelogram by DrJJenkins w/ multilevel cervical DDD & stenosis- s/p ant cerv discectomy & fusion w/ plating at C3-4. ~  6/10: current meds from [facility] includes--- Percocet 10mg - Tid; Tramadol 50mg - 1 to 2 Qid; Celebrex 200mg Bid; Lidoderm patches; Lyrica 150mg Bid; Cymbalta  60mg /d; Flexeril 10mg Tid; Clonidine0.1mg - 1.2 Bid for RSD... ~  11/10: pt reports Myelogram & CT scan showed 5 discs & spinal stenosis... he is to see Neurosurg soon. ~  5/11:  he reports monthly OV's w/ Pain Management & continues on 8 meds listed... ~  Imaging data in Epic showed CT Myelogram CSpine 3/12 by DrJJenkins> prev fusion C3-4 is satis, progressive spondylosis & facet degen at C2-3, unchanged spondy & DDD at C4-5, mild sp stenosis at several levels. ~  1/13:  He continues on same pain med regimen from [facility]> we do not have notes from Pain management or Neurosurg... ~  11/13:  S/p right TKR by DrRowan... ~  on MULT meds + injections & sp cord stim; followed by [facility] on Fentenyl SQ infusion, Percocet10, OxyIR5, Ultram50, Lyrica150Bid, Celebrex200Bid, Flexeril10Bid, Lidoderm... We do not have notes from the pain doctors & we rely on pt hx for the current list... He also sees DrJJenkins and DrRowan...  VITAMIN D DEFICIENCT >> rec to take OTC Vit D supplement ~2000u daily... ~  Labs 1/13 showed Vit D level = 21... rec to start OTC Vit D supplement ~2000u daily...  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   Past Surgical History  Procedure Laterality Date  . Back surgery  [id number]  . Morphine pump  2009    due to reaction to morphine  . Excision of lipoma from right olecranon area  11/2000    Dr. Rise [name]  . Microdiscectomy and decompression  05/2002    Dr. Tonita Cong  . C3-4 anterior cervical discectomy and fusion with plating at c3-4  05/2006    Dr. Arnoldo Morale  . Spinal cord stimulator implanted      for pain per Dr. Maryruth Eve  . Subcut pain pump implanted    . Pain pump implantation      with fentanyl  . Tonsillectomy    . Colonoscopy w/ polypectomy    . Knee arthroscopy      right knee  . Total knee arthroplasty  22/20/2013     RIGHT KNEE  . Total knee arthroplasty  02/29/2012    Procedure: TOTAL KNEE ARTHROPLASTY;  Surgeon: Kerin Salen, MD;  Location: [facility];  Service: Orthopedics;  Laterality: Right;    Outpatient Encounter Prescriptions as of 06/18/2013  Medication Sig  . celecoxib (CELEBREX) 200 MG capsule Take 200 mg by mouth 2 (two) times daily.    . cyclobenzaprine (FLEXERIL) 10 MG tablet Take 10 mg by mouth 2 (two) times daily as needed. Muscle spasm.  [name] ENDOCET 10-325 MG per tablet Take 1 tablet by mouth 3 (three) times  daily as needed. pain  . fentaNYL 0.05 MG/ML SOLN 5,000 mcg Inject into the skin continuous. Patient will bring dosage instructions with him. Daily dose 0.84mg   . LYRICA 150 MG capsule Take 150 mg by mouth 2 (two) times daily.   . traMADol (ULTRAM) 50 MG tablet Take 50 mg by mouth 3 (three) times daily as needed. pain  . [DISCONTINUED] Canagliflozin 100 MG TABS Take one tablet by mouth daily  . [DISCONTINUED] cloNIDine (CATAPRES) 0.1 MG tablet Take 1 tablet (0.1 mg total) by mouth 2 (two) times daily.  . [DISCONTINUED] CYMBALTA 60 MG capsule Take 60 mg by mouth daily.   . [DISCONTINUED] glimepiride (AMARYL) 4 MG tablet Take 1 tablet (4 mg total) by mouth 2 (two) times daily.  . [DISCONTINUED] metFORMIN (GLUCOPHAGE) 500 MG tablet TAKE 2 TABLETS BY MOUTH TWO TIMES DAILY  . [DISCONTINUED] oxyCODONE (OXY IR/ROXICODONE) 5 MG immediate release tablet Take 1-2 tablets (5-10 mg total) by mouth every 3 (three) hours as needed for pain (breakthrough pain post-op).  . [DISCONTINUED] simvastatin (ZOCOR) 40 MG tablet TAKE 1 TABLET (40 MG TOTAL) BY MOUTH AT BEDTIME.  . [DISCONTINUED] sitaGLIPtin (JANUVIA) 100 MG tablet TAKE 1 TABLET (100 MG TOTAL) BY MOUTH DAILY.  . [DISCONTINUED] tamsulosin (FLOMAX) 0.4 MG CAPS capsule Take 1 capsule (0.4 mg total) by mouth daily after supper.  . [DISCONTINUED] lidocaine (LIDODERM) 5 % Place 1 patch onto the skin daily as needed. Pain. Remove & Discard patch within 12  hours    Allergies  Allergen Reactions  . Codeine Itching  . Morphine And Related Other (See Comments)    hallucinations    Current Medications, Allergies, Past Medical History, Past Surgical History, Family History, and Social History were reviewed in [employer] record.    Review of Systems         See HPI - .all other systems neg except as noted... The patient complains of dyspnea on exertion, peripheral edema, muscle weakness, and difficulty walking due to chr LBP (has cane).  The patient denies anorexia, fever, weight loss, weight gain, vision loss, decreased hearing, hoarseness, chest pain, syncope, prolonged cough, headaches, hemoptysis, abdominal pain, melena, hematochezia, severe indigestion/heartburn, hematuria, incontinence, suspicious skin lesions, transient blindness, depression, unusual weight change, abnormal bleeding, enlarged lymph nodes, and angioedema.     Objective:   Physical Exam     WD, Obese, 68 y/o WM in NAD... GENERAL:  Alert & oriented; pleasant & cooperative. HEENT:  [place]/AT, EOM-wnl, PERRLA, EACs-clear, TMs-wnl, NOSE-clear, THROAT-clear & wnl. NECK:  Supple w/ decrROM & ant cerv scar; no JVD; normal carotid impulses w/o bruits; no thyromegaly or nodules palpated; no lymphadenopathy. CHEST:  Clear to P & A; without wheezes/ rales/ or rhonchi heard... HEART:  Regular Rhythm; without murmurs/ rubs/ or gallops detected... ABDOMEN:  Obese, soft & nontender; decr bowel sounds; no organomegaly or masses palpated... EXT: s/p right TKR, mod arthritic changes; no varicose veins/ +venous insuffic/ tr edema. NEURO:  CN's intact; motor testing normal (lim by pain); sensory testing normal; abn gait & balance fair... Ambulates w/ cane, scar of prev Lumbar surg, & has implanted sp cord stimulator. DERM:  Mod lipoma in right antecubital area...  RADIOLOGY DATA:  Reviewed in the EPIC EMR & discussed w/ the patient...  LABORATORY DATA:  Reviewed in  the EPIC EMR & discussed w/ the patient...   Assessment:      DM>  On Metformin, Glimep, Januvia, & Inkovana as above;  Compliance has been poor & asked  to take regularly so we can assess it's efficacy; A1c = 9.1 in Jan2015; he does not want insulin.   COPD>  Former 3ppd smoker & quit 2003;  CXR w/ COPD/emphysema, NAD;  PFT w/ combined mild obstructive & restrictive dis;  He denies brewathing problems & doesn't want inhalers etc...  HBP>  Controlled on diet & Clonidine (per DrRauch);  States BP is only elev when he is in pain...  Ven Insuffic>  He knows to avoid sodium, elev legs, wear support hose etc...  Hyperlipid>  On Simva40 but w/ suboptimal medication & diet compliance;  Asked to take med daily...  Obesity>  Unable to exercise he says due to chronic back pain;  We reviewed wt reducing diet strategies ==> weight is down 22# "on diet" he says...  GI> Divertics, Polyps, Constip>  He is overdue for f/u colonoscopy & we will refer chart to GI...  GU- Hematuria>  eval by drMacDiarmid reported neg; we will Rx w/ Flomax0.4.Marland KitchenMarland Kitchen  DJD/ LBP/ RSD/ Chronic Pain Syndrome>  This is his main problem that consumes his life 24/7; Managed & followed by Pain Management DrRauch etal... S/p right TKR by [facility] 11/13...     Plan:

## 2013-12-24 ENCOUNTER — Encounter: Payer: Self-pay | Admitting: Internal Medicine

## 2013-12-24 ENCOUNTER — Ambulatory Visit (INDEPENDENT_AMBULATORY_CARE_PROVIDER_SITE_OTHER): Payer: Medicare PPO | Admitting: Internal Medicine

## 2013-12-24 VITALS — BP 136/76 | HR 84 | Temp 97.7°F | Resp 16 | Ht 67.75 in | Wt 224.2 lb

## 2013-12-24 DIAGNOSIS — E785 Hyperlipidemia, unspecified: Secondary | ICD-10-CM

## 2013-12-24 DIAGNOSIS — E119 Type 2 diabetes mellitus without complications: Secondary | ICD-10-CM

## 2013-12-24 DIAGNOSIS — Z79899 Other long term (current) drug therapy: Secondary | ICD-10-CM

## 2013-12-24 DIAGNOSIS — Z0000 Encounter for general adult medical examination without abnormal findings: Secondary | ICD-10-CM

## 2013-12-24 DIAGNOSIS — E559 Vitamin D deficiency, unspecified: Secondary | ICD-10-CM

## 2013-12-24 DIAGNOSIS — I10 Essential (primary) hypertension: Secondary | ICD-10-CM

## 2013-12-24 LAB — CBC WITH DIFFERENTIAL/PLATELET
BASOS ABS: 0 10*3/uL (ref 0.0–0.1)
Basophils Relative: 0 % (ref 0–1)
EOS PCT: 3 % (ref 0–5)
Eosinophils Absolute: 0.2 10*3/uL (ref 0.0–0.7)
HEMATOCRIT: 38.4 % — AB (ref 39.0–52.0)
Hemoglobin: 12.9 g/dL — ABNORMAL LOW (ref 13.0–17.0)
LYMPHS PCT: 25 % (ref 12–46)
Lymphs Abs: 2 10*3/uL (ref 0.7–4.0)
MCH: 28.6 pg (ref 26.0–34.0)
MCHC: 33.6 g/dL (ref 30.0–36.0)
MCV: 85.1 fL (ref 78.0–100.0)
Monocytes Absolute: 0.4 10*3/uL (ref 0.1–1.0)
Monocytes Relative: 5 % (ref 3–12)
Neutro Abs: 5.4 10*3/uL (ref 1.7–7.7)
Neutrophils Relative %: 67 % (ref 43–77)
Platelets: 278 10*3/uL (ref 150–400)
RBC: 4.51 MIL/uL (ref 4.22–5.81)
RDW: 13.8 % (ref 11.5–15.5)
WBC: 8 10*3/uL (ref 4.0–10.5)

## 2013-12-24 LAB — HEMOGLOBIN A1C
HEMOGLOBIN A1C: 7.4 % — AB (ref <5.7)
Mean Plasma Glucose: 166 mg/dL — ABNORMAL HIGH (ref <117)

## 2013-12-24 NOTE — Progress Notes (Signed)
Patient ID: James Parsons, male   DOB: 08-21-1945, 68 y.o.   MRN: [id number] Patient has been followed for mild labile HTN, T2_NIDDM, Hyperlipidemia, and Vitamin D Deficiency.  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 Currently he is on maintenance narcotic analgesics and relate he has had a spinal cord stimulator since 2005 and a Fentanyl pump since about 2010.  HTN predates since 2010. Patient's BP has been controlled on Clonidine low dose w/o any SE's.Today's BP: 132/80 mmHg. Patient denies any cardiac symptoms as chest pain, palpitations, shortness of breath, dizziness or ankle swelling.  Patient's hyperlipidemia is controlled with diet and medications. Patient denies myalgias or other medication SE's. Last Lipids checked in July 2014 were as below at goal.    This very nice 68 y.o.MWM presents for Presurgical evaluation follow up with Hypertension, Hyperlipidemia,T2_NIDDM  and Vitamin D Deficiency.  Patient has Chronic LBP predating to a work related accident [place] in 2003 with LBP/RSD/CRPS of the RLE and who is on a Fentanyl Spinal cord pump (2010)  and a spinal cord stimulator (2005). Patient also has DJD/DDD and spinal stenosis and is tentatively scheduled for surgery By Dr Melina Schools for Lumbar stenosis in about 3-4 weeks . Patient relates Worker's comp has been very slow to approve his recommended surgeries.    Patient is treated for HTN & BP has been controlled and today's BP is 136/76 mmHg. Patient denies any cardiac type chest pain, palpitations, dyspnea/orthopnea/PND, dizziness, claudication, or dependent edema.   Hyperlipidemia is not controlled with diet & meds. Patient denies myalgias or other med SE's. Last Lipids were  Not at goal - Total Chol 276*; HDL  45;  LDL  195*; Trig 179 on 09/18/2013.   Also, the patient has history of T2_NIDDM (2007) and patient denies any symptoms of reactive hypoglycemia, diabetic polys, paresthesias or visual blurring.  Patient reports he's only taking # 1 Metformin/day and his FBG's range betw 120-123 mg%. Last A1c was 7.0% on 10/16/2013.    Further, Patient has history of Vitamin D Deficiency and patient supplements vitamin D without any suspected side-effects. Last vitamin D was 25 on 09/18/2013.   Medication List   aspirin 81 MG chewable tablet  Chew 81 mg by mouth daily.     celecoxib 200 MG capsule  Commonly known as:  CELEBREX  Take 200 mg by mouth 2 (two) times daily.     cloNIDine 0.1 MG tablet  Commonly known as:  CATAPRES  TAKE ONE TABLET BY MOUTH TWICE DAILY     cyclobenzaprine 10 MG tablet  Commonly known as:  FLEXERIL  Take 10 mg by mouth 2 (two) times daily as needed. Muscle spasm.     DULoxetine 60 MG capsule  Commonly known as:  CYMBALTA  Take 60 mg by mouth daily.     ENDOCET 10-325 MG per tablet  Generic drug:  oxyCODONE-acetaminophen  Take 1 tablet by mouth 3 (three) times daily as needed. pain     fentaNYL 0.05 MG/ML SOLN 5,000 mcg  Inject into the skin continuous. Patient will bring dosage instructions with him. Daily dose 0.84mg      LYRICA 150 MG capsule  Generic drug:  pregabalin  Take 150 mg by mouth 2 (two)  times daily.     metFORMIN 500 MG tablet  Commonly known as:  GLUCOPHAGE  Take 2 tablets twice daily by mouth     sitaGLIPtin 100 MG tablet  Commonly known as:  JANUVIA  Take 1 tablet (100 mg total) by mouth daily.     tamsulosin 0.4 MG Caps capsule  Commonly known as:  FLOMAX  Take 0.4 mg by mouth daily.     traMADol 50 MG tablet  Commonly known as:  ULTRAM  Take 50 mg by mouth 3 (three) times daily as needed. pain     Allergies  Allergen Reactions  . Codeine Itching  . Morphine And Related Other (See Comments)    hallucinations   PMHx:   Past Medical  History  Diagnosis Date  . Anemia   . Arthritis   . Blood transfusion   . Clotting disorder   . Diabetes mellitus   . COPD (chronic obstructive pulmonary disease)   . Borderline hypertension   . Venous insufficiency   . Hyperlipidemia   . Obesity   . Diverticulosis of colon   . Hx of colonic polyps   . Low back pain   . Lipoma   . RSD (reflex sympathetic dystrophy)   . Pneumonia   . Mental disorder   . Depression     hx of    FHx:    Reviewed / unchanged SHx:    Reviewed / unchanged  Systems Review:  Constitutional: Denies fever, chills, wt changes, headaches, insomnia, fatigue, night sweats, change in appetite. Eyes: Denies redness, blurred vision, diplopia, discharge, itchy, watery eyes.  ENT: Denies discharge, congestion, post nasal drip, epistaxis, sore throat, earache, hearing loss, dental pain, tinnitus, vertigo, sinus pain, snoring.  CV: Denies chest pain, palpitations, irregular heartbeat, syncope, dyspnea, diaphoresis, orthopnea, PND, claudication or edema. Respiratory: denies cough, dyspnea, DOE, pleurisy, hoarseness, laryngitis, wheezing.  Gastrointestinal: Denies dysphagia, odynophagia, heartburn, reflux, water brash, abdominal pain or cramps, nausea, vomiting, bloating, diarrhea, constipation, hematemesis, melena, hematochezia  or hemorrhoids. Genitourinary: Denies dysuria, frequency, urgency, nocturia, hesitancy, discharge, hematuria or flank pain. Musculoskeletal: Denies arthralgias, myalgias, stiffness, jt. swelling, pain, limping or strain/sprain.  Skin: Denies pruritus, rash, hives, warts, acne, eczema or change in skin lesion(s). Neuro: No weakness, tremor, incoordination, spasms, paresthesia or pain. Psychiatric: Denies confusion, memory loss or sensory loss. Endo: Denies change in weight, skin or hair change.  Heme/Lymph: No excessive bleeding, bruising or enlarged lymph nodes.  Exam:  BP 136/76  Pulse 84  Temp(Src) 97.7 F (36.5 C) (Temporal)  Resp  16  Ht 5' 7.75" (1.721 m)  Wt 224 lb 3.2 oz (101.696 kg)  BMI 34.34 kg/m2  Appears well nourished and in no distress. Eyes: PERRLA, EOMs, conjunctiva no swelling or erythema. Sinuses: No frontal/maxillary tenderness ENT/Mouth: EAC's clear, TM's nl w/o erythema, bulging. Nares clear w/o erythema, swelling, exudates. Oropharynx clear without erythema or exudates. Oral hygiene is good. Tongue normal, non obstructing. Hearing intact.  Neck: Supple. Thyroid nl. Car 2+/2+ without bruits, nodes or JVD. Chest: symmetric with normal excursions and percussion.  Respiratory: BS equal and clear bilateral without rales, rhonchi or wheezing.  Cardio: Heart sounds are normal with regular rate and rhythm and no murmurs, rubs or gallops. Peripheral pulses are normal and equal bilaterally in UE and 0-1(+) in lower extremities with venous stasis pigmentation changes of Bilat distal LE's without edema. No aortic or femoral bruits.  Abdomen: rotund, soft, with bowl sounds. Nontender w/o  guarding, rebound, hernias, masses or tenderness.[name]  Musculoskeletal: Full ROM all peripheral extremities, joint stability, 5/5 strength, and sl BB gait. 6" vertical scar over the Rt. Patella. Vertical lumbar scar.  Skin: Warm and dry without rashes, lesions, cyanosis, clubbing or ecchymosis.  Neuro: Cranial nerves intact, reflexes 1 (+) equal bilaterally in UE& absent in LE's. Generalized decrease in muscle power tone and bulk. No o cerebellar symptoms. Sensation intact in LLE, but decreased in a circumferential stocking distribution below the Rt knee.  Pysch: Awake and oriented X 3, normal affect, insight and judgment appropriate.  Assessment and Plan:  1. Hypertension - Continue monitor blood pressure at home. Continue diet/meds same.  2. Hyperlipidemia - Continue diet/meds, exercise,& lifestyle modifications. Continue monitor periodic cholesterol/liver & renal functions   3. T2_NIDDM- Continue diet, exercise, lifestyle  modifications. Monitor appropriate labs.  4. Vitamin D Deficiency - Continue supplementation.  5. Chronic Neck & Back Pain Due to DJD & DDD -  Patient is felt stable for anticipated surgery with recommendations for peri- and post-operative diabetic monitoring by protocol and post-op anti-thrombosis prophylaxis.  Recommended regular exercise, BP monitoring, weight loss, and discussed med and SE's. Recommended labs to assess and monitor clinical status. Further disposition pending results of labs.  Patient is also evaluated for  MEDICARE ANNUAL WELLNESS VISIT AND CPE  Assessment:   1. Hypertension - TSH  2. HYPERLIPIDEMIA - Lipid panel to assess need for treatment if dietary management inadequate.  3. T2_NIDDM - Hemoglobin A1c - Insulin, fasting - consider ACEi empiric Tx.   4. Vitamin D Deficiency - Vit D  Level  5. Encounter for long-term (current) use of other medications - CBC with Differential - BASIC METABOLIC PANEL WITH GFR - Hepatic function panel - Magnesium  Plan:   During the course of the visit the patient was educated and counseled about appropriate screening and preventive services including:    Pneumococcal vaccine   Influenza vaccine  Td vaccine  Screening electrocardiogram  Bone densitometry screening  Colorectal cancer screening  Diabetes screening  Glaucoma screening  Nutrition counseling   Advanced directives: requested  Screening recommendations, referrals: Vaccinations: Tdap vaccine ordered Influenza vaccine not indicated Pneumococcal vaccine not indicated Shingles vaccine declined Hep B vaccine not indicated  Nutrition assessed and recommended  Colonoscopy not indicated Recommended yearly ophthalmology/optometry visit for glaucoma screening and checkup Recommended yearly dental visit for hygiene and checkup Advanced directives - not indicated  Conditions/risks identified: BMI: Discussed weight loss, diet, and increase  physical activity.  Increase physical activity: AHA recommends 150 minutes of physical activity a week.  Medications reviewed Diabetes is not at goal, ACE/ARB therapy: No, Reason not on Ace Inhibitor/ARB therapy:  deferred Urinary Incontinence is not an issue: discussed non pharmacology and pharmacology options.  Fall risk: low- discussed PT, home fall assessment, medications.    Subjective:  James Parsons is a 68 y.o. male who presents for Medicare Annual Wellness Visit and complete physical.  Date of last medicare wellness visit is unknown.  He has had elevated blood pressure since 2010. His blood pressure has been controlled at home, today their BP is BP: 136/76 mmHg He does not workout. He denies chest pain, shortness of breath, dizziness.  He is not on cholesterol medication and denies myalgias. His cholesterol is not at goal as managed by his prior Internist. The cholesterol last visit was:   Lab Results  Component Value Date   CHOL 276* 09/18/2013   HDL 45 09/18/2013   LDLCALC 195* 09/18/2013   LDLDIRECT 178.6 09/08/2009  TRIG 179* 09/18/2013   CHOLHDL 6.1 09/18/2013   He has had diabetes for 8  Years (2007). He has not been working on diet and exercise  and denies foot ulcerations, hyperglycemia, hypoglycemia , nausea, polydipsia, visual disturbances and vomiting. Last A1C in the office was:  Lab Results  Component Value Date   HGBA1C 7.0* 10/16/2013   Patient was not on Vitamin D supplement.   Lab Results  Component Value Date   VD25OH 25* 09/18/2013     Names of Other Physician/Practitioners you currently use: 1. [facility] here for primary care 2. Dr Katy Fitch, eye doctor, last visit 2014 3. No dentist, has dentures Patient Care Team: Unk Pinto, MD as PCP - General (Internal Medicine) Sable Feil, MD as Consulting Physician (Gastroenterology) Clent Jacks, MD as Consulting Physician (Ophthalmology) Melina Schools, MD as  Consulting Physician (Orthopedic Surgery) [place] L. Maryruth Eve, MD as Physician Assistant (Pain Medicine) Lennie Odor, MD as Referring Physician (Pain Medicine)  Medication Review: Medication Sig  . aspirin 81 MG chewable tablet Chew 81 mg by mouth daily.   . celecoxib (CELEBREX) 200 MG capsule Take 200 mg by mouth 2 (two) times daily.    . cloNIDine (CATAPRES) 0.1 MG tablet TAKE ONE TABLET BY MOUTH TWICE DAILY   . cyclobenzaprine (FLEXERIL) 10 MG tablet Take 10 mg by mouth 2 (two) times daily as needed. Muscle spasm.  . DULoxetine (CYMBALTA) 60 MG capsule Take 60 mg by mouth daily.   . ENDOCET 10-325 MG per tablet Take 1 tablet by mouth 3 (three) times daily as needed. pain  . fentaNYL 0.05 MG/ML SOLN 5,000 mcg Inject into the skin continuous. Patient will bring dosage instructions with him. Daily dose 0.84mg   . LYRICA 150 MG capsule Take 150 mg by mouth 2 (two) times daily.   . metFORMIN (GLUCOPHAGE) 500 MG tablet Take 2 tablets twice daily by mouth  . sitaGLIPtin (JANUVIA) 100 MG tablet Take 1 tablet (100 mg total) by mouth daily.  . tamsulosin (FLOMAX) 0.4 MG CAPS capsule Take 0.4 mg by mouth daily.   . traMADol (ULTRAM) 50 MG tablet Take 50 mg by mouth 3 (three) times daily as needed. pain   Current Problems (verified) Patient Active Problem List   Diagnosis Date Noted  . Chronic pain associated with significant psychosocial dysfunction 10/24/2013  . Rhabdomyolysis 10/16/2013  . Vitamin D Deficiency 09/18/2013  . Encounter for long-term (current) use of other medications 09/18/2013  . T2_NIDDM 11/02/2012  . BPH (benign prostatic hypertrophy) with urinary obstruction 11/02/2012  . Anemia 11/02/2012  . 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 Cervical pain 05/17/2012  . Lumbar canal stenosis 05/17/2012  . Spinal stenosis of thoracic region 05/17/2012  . DJD (degenerative joint disease) 04/24/2012  . Hematuria 04/24/2012  . Osteoarthritis of right knee 03/02/2012  .  Hypertension 03/20/2010  . COPD 03/09/2009  . VENOUS INSUFFICIENCY 09/12/2008  . Diverticulosis 09/12/2008  . COLONIC POLYPS 02/22/2007  . HYPERLIPIDEMIA 02/22/2007  . OBESITY 02/22/2007  . LOW BACK PAIN SYNDROME 02/22/2007   Screening Tests Health Maintenance  Topic Date Due  . Urine Microalbumin  02/28/1956  . Zostavax  02/27/2006  . Ophthalmology Exam  01/05/2013  . Influenza Vaccine  11/09/2013  . Hemoglobin A1c  04/18/2014  . Foot Exam  09/19/2014  . Colonoscopy  01/25/2023  . Tetanus/tdap  05/08/2023  . Pneumococcal Polysaccharide Vaccine Age 55 And Over  Completed   Immunization History  Administered Date(s) Administered  .  Influenza Split 04/22/2011, 01/20/2012  . Influenza Whole 01/15/2007, 01/21/2009, 03/08/2010  . Influenza, High Dose Seasonal PF 02/07/2013  . Influenza-Unspecified 12/10/2013  . Pneumococcal Polysaccharide-23 04/22/2011  . Tdap 04/11/2001, 05/07/2013   Preventative care: Last colonoscopy: 01/2013  Prior vaccinations: TD or Tdap: 05/07/2013  Influenza: HD 12/10/2013  Pneumococcal: 04/22/2011 Shingles/Zostavax: declines  History reviewed: allergies, current medications, past family history, past medical history, past social history, past surgical history and problem list   Risk Factors: Tobacco History  Substance Use Topics  . Smoking status: Former Smoker -- 3.00 packs/day for 40 years    Types: Cigarettes    Quit date: 05/25/2002  . Smokeless tobacco: Never Used  . Alcohol Use: No   He does not smoke.  Patient is not a former smoker. Are there smokers in your home (other than you)?  No  Alcohol Current alcohol use: none  Caffeine Current caffeine use: coffee 1 /day  Exercise Current exercise: none  Nutrition/Diet Current diet: in general, an "unhealthy" diet  Cardiac risk factors: advanced age (older than 16 for men, 52 for women), diabetes mellitus, dyslipidemia, hypertension, male gender, obesity (BMI >= 30 kg/m2) and  sedentary lifestyle.  Depression Screen (Note: if answer to either of the following is "Yes", a more complete depression screening is indicated)   Q1: Over the past two weeks, have you felt down, depressed or hopeless? No  Q2: Over the past two weeks, have you felt little interest or pleasure in doing things? No  Have you lost interest or pleasure in daily life? No  Do you often feel hopeless? sometimes  Do you cry easily over simple problems? No  Activities of Daily Living In your present state of health, do you have any difficulty performing the following activities?:  Driving? No Managing money?  No Feeding yourself? No Getting from bed to chair? No Climbing a flight of stairs? Yes Preparing food and eating?: No Bathing or showering? Yes Getting dressed: No Getting to the toilet? No Using the toilet:No Moving around from place to place: No In the past year have you fallen or had a near fall?:Yes   Are you sexually active?  No  Do you have more than one partner?  No  Vision Difficulties: No  Hearing Difficulties: No Do you often ask people to speak up or repeat themselves? Yes Do you experience ringing or noises in your ears? No Do you have difficulty understanding soft or whispered voices? No  Cognition  Do you feel that you have a problem with memory?No  Do you often misplace items? No  Do you feel safe at home?  Yes  Advanced directives Does patient have a [place]? Yes Does patient have a Living Will? Yes  Objective:     Blood pressure 136/76, pulse 84, temperature 97.7 F (36.5 C), temperature source Temporal, resp. rate 16, height 5' 7.75" (1.721 m), weight 224 lb 3.2 oz (101.696 kg). Body mass index is 34.34 kg/(m^2).  General appearance: alert, no distress, WD/WN, male Cognitive Testing  Alert? Yes  Normal Appearance?Yes  Oriented to person? Yes  Place? Yes   Time? Yes  Recall of three objects?  Yes  Can perform simple  calculations? Yes  Displays appropriate judgment?Yes  Can read the correct time from a watch face?Yes  Physical Exam as above    Medicare Attestation I have personally reviewed: The patient's medical and social history Their use of alcohol, tobacco or illicit drugs Their current medications and supplements  The patient's functional ability including ADLs,fall risks, home safety risks, cognitive, and hearing and visual impairment Diet and physical activities Evidence for depression or mood disorders  The patient's weight, height, BMI, and visual acuity have been recorded in the chart.  I have made referrals, counseling, and provided education to the patient based on review of the above and I have provided the patient with a written personalized care plan for preventive services.    Susy Placzek DAVID, MD   12/24/2013

## 2013-12-24 NOTE — Patient Instructions (Signed)

## 2013-12-25 LAB — HEPATIC FUNCTION PANEL
ALK PHOS: 85 U/L (ref 39–117)
ALT: 14 U/L (ref 0–53)
AST: 23 U/L (ref 0–37)
Albumin: 4.1 g/dL (ref 3.5–5.2)
Bilirubin, Direct: 0.1 mg/dL (ref 0.0–0.3)
Indirect Bilirubin: 0.2 mg/dL (ref 0.2–1.2)
TOTAL PROTEIN: 7.9 g/dL (ref 6.0–8.3)
Total Bilirubin: 0.3 mg/dL (ref 0.2–1.2)

## 2013-12-25 LAB — LIPID PANEL
Cholesterol: 217 mg/dL — ABNORMAL HIGH (ref 0–200)
HDL: 38 mg/dL — ABNORMAL LOW (ref >39)
LDL CALC: 143 mg/dL — AB (ref 0–99)
Total CHOL/HDL Ratio: 5.7 Ratio
Triglycerides: 179 mg/dL — ABNORMAL HIGH (ref <150)
VLDL: 36 mg/dL (ref 0–40)

## 2013-12-25 LAB — INSULIN, FASTING: INSULIN FASTING, SERUM: 19.3 u[IU]/mL (ref 2.0–19.6)

## 2013-12-25 LAB — BASIC METABOLIC PANEL WITH GFR
BUN: 12 mg/dL (ref 6–23)
CHLORIDE: 100 meq/L (ref 96–112)
CO2: 30 meq/L (ref 19–32)
Calcium: 9.5 mg/dL (ref 8.4–10.5)
Creat: 0.65 mg/dL (ref 0.50–1.35)
GFR, Est African American: >89 mL/min
GFR, Est Non African American: >89 mL/min
Glucose, Bld: 232 mg/dL — ABNORMAL HIGH (ref 70–99)
Potassium: 4.8 mEq/L (ref 3.5–5.3)
Sodium: 138 mEq/L (ref 135–145)

## 2013-12-25 LAB — TSH: TSH: 4.758 u[IU]/mL — ABNORMAL HIGH (ref 0.350–4.500)

## 2013-12-25 LAB — MAGNESIUM: Magnesium: 1.9 mg/dL (ref 1.5–2.5)

## 2013-12-25 LAB — VITAMIN D 25 HYDROXY (VIT D DEFICIENCY, FRACTURES): VIT D 25 HYDROXY: 62 ng/mL (ref 30–89)

## 2013-12-30 NOTE — H&P (Addendum)
James Parsons is an 68 y.o. male.   History of Present Illness The patient is a 68 year old male who comes in today for a preoperative history and physical.   Allergies  Morphine Sulfate (Concentrate) *ANALGESICS - OPIOID* Codeine Phosphate *ANALGESICS - OPIOID*  Family History  Cancer Father. Heart Disease Brother. Osteoarthritis Mother, Sister. First Degree Relatives  Social History Tobacco use Former smoker. 08/13/2013: smoke(d) 2 pack(s) per day Children 2 Current work status disabled Exercise Exercises rarely Living situation live with spouse Marital status married Never consumed alcohol 08/13/2013: Never consumed alcohol No history of drug/alcohol rehab Number of flights of stairs before winded 2-3 Tobacco / smoke exposure 08/13/2013: no Under pain contract  Medication History  CloNIDine HCl (0.1MG  Tablet, Oral) Active. Cyclobenzaprine HCl (10MG  Tablet, Oral) Active. DULoxetine HCl (60MG  Capsule DR Part, Oral) Active. MetFORMIN HCl (500MG  Tablet, Oral) Active. Oxycodone-Acetaminophen (10-325MG  Tablet, Oral) Active. TraMADol HCl (50MG  Tablet, Oral) Active. Medications Reconciled  Vitals 12/30/2013 10:53 AM Weight: 223.31 lb Height: 67.25in Body Surface Area: 2.19 Parsons Body Mass Index: 34.72 kg/Parsons Temp.: 35F(Oral)  Pulse: 105 (Regular)  BP: 167/97 (Sitting, Left Arm, Standard) Patient is currently in a lot of pain./ljf   Assessment & Plan  11:25 AM) Lumbar spinal stenosis (724.02  M48.07) Story: with claudication Current Plans  We have gone over the risks and benefits of surgery, which include infection, bleeding, nerve damage, death, stroke, paralysis, failure to heal, need for further surgery, ongoing or worse pain, loss of fixation, need for further surgery, CSF leak, loss of bowel or bladder control, ongoing or worse pain. Risks of surgery include, but are not limited to: Death, stroke, paralysis, nerve root  damage/injury, bleeding, blood clots, loss of bowel/bladder control, sexual dysfunction, retrograde ejaculation, hardware failure, or malposition, spinal fluid leak, adjacent segment disease, non-union, need for further surgery, ongoing or worse pain, injury to bladder, bowel and abdominal contents, infection and recurrent disc herniation Follow up in 2 weeks  Past Medical History  Diagnosis Date  . Anemia   . Arthritis   . Blood transfusion   . Clotting disorder   . Diabetes mellitus   . COPD (chronic obstructive pulmonary disease)   . Borderline hypertension   . Venous insufficiency   . Hyperlipidemia   . Obesity   . Diverticulosis of colon   . Hx of colonic polyps   . Low back pain   . Lipoma   . RSD (reflex sympathetic dystrophy)   . Pneumonia   . Mental disorder   . Depression     hx of     Past Surgical History  Procedure Laterality Date  . Back surgery  6010,9323  . Morphine pump  2009    due to reaction to morphine  . Excision of lipoma from right olecranon area  11/2000    Dr. Rise [name]  . Microdiscectomy and decompression  05/2002    Dr. Tonita Cong  . C3-4 anterior cervical discectomy and fusion with plating at c3-4  05/2006    Dr. Arnoldo Morale  . Spinal cord stimulator implanted      for pain per Dr. Maryruth Eve  . Subcut pain pump implanted    . Pain pump implantation      with fentanyl  . Tonsillectomy    . Colonoscopy w/ polypectomy    . Knee arthroscopy      right knee  . 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 Total knee arthroplasty  02/29/2012  Procedure: TOTAL KNEE ARTHROPLASTY;  Surgeon: Kerin Salen, MD;  Location: [place];  Service: Orthopedics;  Laterality: Right;    Family History  Problem Relation Age of Onset  . Cancer Father   . Colon cancer Neg Hx   . Esophageal cancer Neg Hx   . Stomach cancer Neg Hx   . Rectal cancer Neg Hx    Social History:  reports that he quit smoking about 11 years ago. His smoking use included Cigarettes. He  has a 120 pack-year smoking history. He has never used smokeless tobacco. He reports that he does not drink alcohol or use illicit drugs.  Allergies:  Allergies  Allergen Reactions  . Codeine Itching  . Morphine And Related Other (See Comments)    hallucinations    No prescriptions prior to admission    No results found for this or any previous visit (from the past 48 hour(s)). No results found.  Review of Systems  Constitutional: Negative.   HENT: Negative.   Eyes: Negative.   Respiratory: Negative.   Cardiovascular: Negative.   Gastrointestinal: Negative.   Genitourinary: Negative.   Musculoskeletal: Positive for back pain.  Skin: Negative.   Psychiatric/Behavioral: Negative.     There were no vitals taken for this visit. Physical Exam  Constitutional: He is oriented to person, place, and time. He appears well-developed.  HENT:  Head: Normocephalic and atraumatic.  Eyes: EOM are normal. Pupils are equal, round, and reactive to light.  Neck: Normal range of motion.  Cardiovascular: Normal rate and regular rhythm.   Respiratory: Effort normal and breath sounds normal.  GI: Soft. Bowel sounds are normal.  Musculoskeletal: Normal range of motion.  Neurological: He is alert and oriented to person, place, and time.  Skin: Skin is warm and dry.  Psychiatric: He has a normal mood and affect.     Assessment/Plan L4-5 stenosis.  Will proceed with L4-5 decompression with possible in situ fusion as scheduled.  Procedure along with possible risks and complications discussed.  All questions answered.    JamesJAMES Parsons 12/30/2013, 11:44 AM No change to H+P Clinical exam reviewed Plan on lumbar decompression and in situ fusion L4./5

## 2014-01-02 ENCOUNTER — Encounter (HOSPITAL_COMMUNITY): Payer: Self-pay

## 2014-01-04 NOTE — Pre-Procedure Instructions (Addendum)
EXZAVIER RUDERMAN  01/04/2014   Your procedure is scheduled on:  Wed sept 30   Report to [facility] Entrance A  At 1125 AM.  Call this number if you have problems the morning of surgery: [phone]   Remember:   Do not eat food or drink liquids after midnight.   Take these medicines the morning of surgery with A SIP OF WATER: Clonidine(Catapres),Cymbalta(Duloxetine),Pain Pill(if needed),Lyrica,and Flomax(Tamsulosin),fentanyl pump on               Stop taking the Aspirin.celebrex,vitamins. No Goody's,BC's,Aleve,Ibuprofen,Fish Oil,or any Herbal Medications now      No diabetic meds day of surgery   Do not wear jewelry  Do not wear lotions, powders, or colognes. You may wear deodorant.  Men may shave face and neck.  Do not bring valuables to the hospital.  [facility] is not responsible                  for any belongings or valuables.               Contacts, dentures or bridgework may not be worn into surgery.  Leave suitcase in the car. After surgery it may be brought to your room.  For patients admitted to the hospital, discharge time is determined by your                treatment team.               Patients discharged the day of surgery will not be allowed to drive  home.    Special Instructions:   - Preparing for Surgery  Before surgery, you can play an important role.  Because skin is not sterile, your skin needs to be as free of germs as possible.  You can reduce the number of germs on you skin by washing with CHG (chlorahexidine gluconate) soap before surgery.  CHG is an antiseptic cleaner which kills germs and bonds with the skin to continue killing germs even after washing.  Please DO NOT use if you have an allergy to CHG or antibacterial soaps.  If your skin becomes reddened/irritated stop using the CHG and inform your nurse when you arrive at Short Stay.  Do not shave (including legs and underarms) for at least 48 hours prior to the first CHG shower.  You may  shave your face.  Please follow these instructions carefully:   1.  Shower with CHG Soap the night before surgery and the                                morning of Surgery.  2.  If you choose to wash your hair, wash your hair first as usual with your       normal shampoo.  3.  After you shampoo, rinse your hair and body thoroughly to remove the                      Shampoo.  4.  Use CHG as you would any other liquid soap.  You can apply chg directly       to the skin and wash gently with scrungie or a clean washcloth.  5.  Apply the CHG Soap to your body ONLY FROM THE NECK DOWN.        Do not use on open wounds or open sores.  Avoid contact with your eyes,  ears, mouth and genitals (private parts).  Wash genitals (private parts)       with your normal soap.  6.  Wash thoroughly, paying special attention to the area where your surgery        will be performed.  7.  Thoroughly rinse your body with warm water from the neck down.  8.  DO NOT shower/wash with your normal soap after using and rinsing off       the CHG Soap.  9.  Pat yourself dry with a clean towel.            10.  Wear clean pajamas.            11.  Place clean sheets on your bed the night of your first shower and do not        sleep with pets.  Day of Surgery  Do not apply any lotions/deoderants the morning of surgery.  Please wear clean clothes to the hospital/surgery center.     Please read over the following fact sheets that you were given: Pain Booklet, Coughing and Deep Breathing, MRSA Information and Surgical Site Infection Prevention

## 2014-01-06 ENCOUNTER — Encounter (HOSPITAL_COMMUNITY)
Admission: RE | Admit: 2014-01-06 | Discharge: 2014-01-06 | Disposition: A | Discharge status: Home / Self Care | Payer: Worker's Compensation | Source: Ambulatory Visit | Attending: Orthopedic Surgery | Admitting: Orthopedic Surgery

## 2014-01-06 ENCOUNTER — Encounter (HOSPITAL_COMMUNITY): Payer: Self-pay

## 2014-01-06 DIAGNOSIS — M48061 Spinal stenosis, lumbar region without neurogenic claudication: Secondary | ICD-10-CM | POA: Diagnosis present

## 2014-01-06 DIAGNOSIS — M4806 Spinal stenosis, lumbar region: Principal | ICD-10-CM | POA: Insufficient documentation

## 2014-01-06 DIAGNOSIS — Y831 Surgical operation with implant of artificial internal device as the cause of abnormal reaction of the patient, or of later complication, without mention of misadventure at the time of the procedure: Secondary | ICD-10-CM | POA: Diagnosis not present

## 2014-01-06 DIAGNOSIS — E119 Type 2 diabetes mellitus without complications: Secondary | ICD-10-CM | POA: Diagnosis not present

## 2014-01-06 DIAGNOSIS — T8584XA Pain due to internal prosthetic devices, implants and grafts, not elsewhere classified, initial encounter: Secondary | ICD-10-CM | POA: Insufficient documentation

## 2014-01-06 DIAGNOSIS — Z87891 Personal history of nicotine dependence: Secondary | ICD-10-CM | POA: Diagnosis not present

## 2014-01-06 DIAGNOSIS — T85890A Other specified complication of nervous system prosthetic devices, implants and grafts, initial encounter: Secondary | ICD-10-CM | POA: Diagnosis not present

## 2014-01-06 DIAGNOSIS — F329 Major depressive disorder, single episode, unspecified: Secondary | ICD-10-CM | POA: Insufficient documentation

## 2014-01-06 DIAGNOSIS — K5900 Constipation, unspecified: Secondary | ICD-10-CM | POA: Diagnosis not present

## 2014-01-06 DIAGNOSIS — E785 Hyperlipidemia, unspecified: Secondary | ICD-10-CM | POA: Diagnosis not present

## 2014-01-06 DIAGNOSIS — Z01812 Encounter for preprocedural laboratory examination: Secondary | ICD-10-CM | POA: Diagnosis not present

## 2014-01-06 DIAGNOSIS — J449 Chronic obstructive pulmonary disease, unspecified: Secondary | ICD-10-CM | POA: Insufficient documentation

## 2014-01-06 DIAGNOSIS — I10 Essential (primary) hypertension: Secondary | ICD-10-CM | POA: Insufficient documentation

## 2014-01-06 DIAGNOSIS — F3289 Other specified depressive episodes: Secondary | ICD-10-CM | POA: Diagnosis not present

## 2014-01-06 LAB — COMPREHENSIVE METABOLIC PANEL
ALK PHOS: 104 U/L (ref 39–117)
ALT: 16 U/L (ref 0–53)
ANION GAP: 15 (ref 5–15)
AST: 28 U/L (ref 0–37)
Albumin: 4.1 g/dL (ref 3.5–5.2)
BUN: 9 mg/dL (ref 6–23)
CO2: 28 meq/L (ref 19–32)
Calcium: 10.5 mg/dL (ref 8.4–10.5)
Chloride: 97 mEq/L (ref 96–112)
Creatinine, Ser: 0.57 mg/dL (ref 0.50–1.35)
GFR calc Af Amer: >90 mL/min (ref >90)
Glucose, Bld: 185 mg/dL — ABNORMAL HIGH (ref 70–99)
POTASSIUM: 4.7 meq/L (ref 3.7–5.3)
SODIUM: 140 meq/L (ref 137–147)
TOTAL PROTEIN: 8.6 g/dL — AB (ref 6.0–8.3)
Total Bilirubin: 0.3 mg/dL (ref 0.3–1.2)

## 2014-01-06 LAB — URINALYSIS, ROUTINE W REFLEX MICROSCOPIC
BILIRUBIN URINE: NEGATIVE
GLUCOSE, UA: NEGATIVE mg/dL
HGB URINE DIPSTICK: NEGATIVE
KETONES UR: NEGATIVE mg/dL
Leukocytes, UA: NEGATIVE
Nitrite: NEGATIVE
PH: 6.5 (ref 5.0–8.0)
PROTEIN: NEGATIVE mg/dL
Specific Gravity, Urine: 1.006 (ref 1.005–1.030)
Urobilinogen, UA: 0.2 mg/dL (ref 0.0–1.0)

## 2014-01-06 LAB — PROTIME-INR
INR: 1.03 (ref 0.00–1.49)
Prothrombin Time: 13.5 seconds (ref 11.6–15.2)

## 2014-01-06 LAB — CBC
HEMATOCRIT: 41.1 % (ref 39.0–52.0)
Hemoglobin: 13.6 g/dL (ref 13.0–17.0)
MCH: 28.1 pg (ref 26.0–34.0)
MCHC: 33.1 g/dL (ref 30.0–36.0)
MCV: 84.9 fL (ref 78.0–100.0)
Platelets: 267 10*3/uL (ref 150–400)
RBC: 4.84 MIL/uL (ref 4.22–5.81)
RDW: 13.3 % (ref 11.5–15.5)
WBC: 9.6 10*3/uL (ref 4.0–10.5)

## 2014-01-06 LAB — SURGICAL PCR SCREEN
MRSA, PCR: NEGATIVE
STAPHYLOCOCCUS AUREUS: POSITIVE — AB

## 2014-01-06 LAB — TYPE AND SCREEN
ABO/RH(D): O POS
Antibody Screen: NEGATIVE

## 2014-01-06 LAB — APTT: APTT: 34 s (ref 24–37)

## 2014-01-07 MED ORDER — DEXAMETHASONE SODIUM PHOSPHATE 4 MG/ML IJ SOLN
4.0000 mg | INTRAMUSCULAR | Status: AC
  Administered 2014-01-08: 4 mg via INTRAVENOUS
  Filled 2014-01-07: qty 1

## 2014-01-07 MED ORDER — ACETAMINOPHEN 10 MG/ML IV SOLN
1000.0000 mg | INTRAVENOUS | Status: AC
  Administered 2014-01-08: 1000 mg via INTRAVENOUS

## 2014-01-07 MED ORDER — CEFAZOLIN SODIUM-DEXTROSE 2-3 GM-% IV SOLR
2.0000 g | INTRAVENOUS | Status: AC
  Administered 2014-01-08: 2 g via INTRAVENOUS
  Filled 2014-01-07: qty 50

## 2014-01-07 NOTE — Progress Notes (Signed)
Anesthesia Chart Review: Patient is a 68 year old male scheduled for L4-5 decompression with possible insitu fusion on 01/08/14 by Dr. Rolena Infante.  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 BMI is consistent with obesity. PCP is Dr. [name], last visit 12/24/13 and was aware of plans for surgery and felt patient was "stable for anticipated surgery" with recommendations for DM monitoring and post-op anti-thrombosis prophylaxis. Has seen pulmonologist Dr. Lenna Gilford in the past.    Vitals at PAT showed: HR 115, BP 177/88, RR 16, T 36.9 C.  EKG on 01/06/14 showed ST at 104 bpm.  CXR on 10/16/13 showed: No acute abnormality. Chronic lung disease.   PFT 1/13 showed FVC=2.65 (59%), FEV1=1.89 (54%). Moderately severe restriction with superimposed mild airflow obstruction.   Preoperative labs noted.    Patient is medically cleared. If no acute changes then I anticipate that he can proceed as planned.  George Hugh [facility] Short Stay Center/Anesthesiology Phone [phone] 01/07/2014 10:22 AM

## 2014-01-07 NOTE — Progress Notes (Signed)
Left message, patient instructed to arrive at 945 am 01-08-14.

## 2014-01-08 ENCOUNTER — Encounter (HOSPITAL_COMMUNITY): Payer: Worker's Compensation | Admitting: Vascular Surgery

## 2014-01-08 ENCOUNTER — Inpatient Hospital Stay (HOSPITAL_COMMUNITY): Payer: Worker's Compensation

## 2014-01-08 ENCOUNTER — Observation Stay (HOSPITAL_COMMUNITY)
Admission: RE | Admit: 2014-01-08 | Discharge: 2014-01-12 | Disposition: A | Discharge status: Skilled Nursing Facility | Payer: Worker's Compensation | Source: Ambulatory Visit | Attending: Orthopedic Surgery | Admitting: Orthopedic Surgery

## 2014-01-08 ENCOUNTER — Inpatient Hospital Stay (HOSPITAL_COMMUNITY): Payer: Worker's Compensation | Admitting: Anesthesiology

## 2014-01-08 ENCOUNTER — Encounter (HOSPITAL_COMMUNITY)
Admission: RE | Disposition: A | Discharge status: Skilled Nursing Facility | Payer: Medicare PPO | Source: Ambulatory Visit | Attending: Orthopedic Surgery

## 2014-01-08 DIAGNOSIS — Z9889 Other specified postprocedural states: Secondary | ICD-10-CM

## 2014-01-08 DIAGNOSIS — M48061 Spinal stenosis, lumbar region without neurogenic claudication: Secondary | ICD-10-CM | POA: Diagnosis not present

## 2014-01-08 HISTORY — PX: LUMBAR LAMINECTOMY/DECOMPRESSION MICRODISCECTOMY: SHX5026

## 2014-01-08 LAB — GLUCOSE, CAPILLARY
GLUCOSE-CAPILLARY: 202 mg/dL — AB (ref 70–99)
GLUCOSE-CAPILLARY: 217 mg/dL — AB (ref 70–99)
Glucose-Capillary: 163 mg/dL — ABNORMAL HIGH (ref 70–99)
Glucose-Capillary: 192 mg/dL — ABNORMAL HIGH (ref 70–99)

## 2014-01-08 SURGERY — LUMBAR LAMINECTOMY/DECOMPRESSION MICRODISCECTOMY 1 LEVEL
Anesthesia: General | Site: Back

## 2014-01-08 MED ORDER — LINAGLIPTIN 5 MG PO TABS
5.0000 mg | ORAL_TABLET | Freq: Every day | ORAL | Status: DC
  Administered 2014-01-09 – 2014-01-12 (×4): 5 mg via ORAL
  Filled 2014-01-08 (×4): qty 1

## 2014-01-08 MED ORDER — GLYCOPYRROLATE 0.2 MG/ML IJ SOLN
INTRAMUSCULAR | Status: DC | PRN
  Administered 2014-01-08: .7 mg via INTRAVENOUS

## 2014-01-08 MED ORDER — EPHEDRINE SULFATE 50 MG/ML IJ SOLN
INTRAMUSCULAR | Status: DC | PRN
  Administered 2014-01-08: 10 mg via INTRAVENOUS

## 2014-01-08 MED ORDER — PHENYLEPHRINE HCL 10 MG/ML IJ SOLN
INTRAMUSCULAR | Status: DC | PRN
  Administered 2014-01-08: 80 ug via INTRAVENOUS

## 2014-01-08 MED ORDER — 0.9 % SODIUM CHLORIDE (POUR BTL) OPTIME
TOPICAL | Status: DC | PRN
  Administered 2014-01-08: 2000 mL

## 2014-01-08 MED ORDER — THROMBIN 20000 UNITS EX SOLR
CUTANEOUS | Status: AC
  Filled 2014-01-08: qty 20000

## 2014-01-08 MED ORDER — TAMSULOSIN HCL 0.4 MG PO CAPS
0.4000 mg | ORAL_CAPSULE | Freq: Every day | ORAL | Status: DC
  Administered 2014-01-09 – 2014-01-12 (×4): 0.4 mg via ORAL
  Filled 2014-01-08 (×4): qty 1

## 2014-01-08 MED ORDER — GLIMEPIRIDE 4 MG PO TABS
4.0000 mg | ORAL_TABLET | Freq: Every day | ORAL | Status: DC
  Administered 2014-01-11 – 2014-01-12 (×2): 4 mg via ORAL
  Filled 2014-01-08 (×5): qty 1

## 2014-01-08 MED ORDER — ROCURONIUM BROMIDE 100 MG/10ML IV SOLN: INTRAVENOUS | Status: DC | PRN

## 2014-01-08 MED ORDER — LACTATED RINGERS IV SOLN
INTRAVENOUS | Status: DC
  Administered 2014-01-08: 10:00:00 via INTRAVENOUS

## 2014-01-08 MED ORDER — PROPOFOL 10 MG/ML IV BOLUS
INTRAVENOUS | Status: AC
  Filled 2014-01-08: qty 20

## 2014-01-08 MED ORDER — OXYCODONE HCL 5 MG PO TABS: 5.0000 mg | ORAL_TABLET | Freq: Once | ORAL | Status: DC | PRN

## 2014-01-08 MED ORDER — GLYCOPYRROLATE 0.2 MG/ML IJ SOLN
INTRAMUSCULAR | Status: AC
  Filled 2014-01-08: qty 3

## 2014-01-08 MED ORDER — INSULIN ASPART 100 UNIT/ML ~~LOC~~ SOLN
0.0000 [IU] | Freq: Three times a day (TID) | SUBCUTANEOUS | Status: DC
  Administered 2014-01-09: 3 [IU] via SUBCUTANEOUS
  Administered 2014-01-09: 5 [IU] via SUBCUTANEOUS
  Administered 2014-01-09 – 2014-01-10 (×3): 3 [IU] via SUBCUTANEOUS
  Administered 2014-01-10 – 2014-01-11 (×2): 2 [IU] via SUBCUTANEOUS
  Administered 2014-01-11: 3 [IU] via SUBCUTANEOUS
  Administered 2014-01-11: 2 [IU] via SUBCUTANEOUS
  Administered 2014-01-12: 3 [IU] via SUBCUTANEOUS
  Administered 2014-01-12: 2 [IU] via SUBCUTANEOUS

## 2014-01-08 MED ORDER — METOCLOPRAMIDE HCL 10 MG PO TABS: 5.0000 mg | ORAL_TABLET | Freq: Three times a day (TID) | ORAL | Status: DC | PRN

## 2014-01-08 MED ORDER — BUPIVACAINE-EPINEPHRINE 0.25% -1:200000 IJ SOLN
INTRAMUSCULAR | Status: DC | PRN
  Administered 2014-01-08: 20 mL

## 2014-01-08 MED ORDER — THROMBIN 20000 UNITS EX SOLR
CUTANEOUS | Status: DC | PRN
  Administered 2014-01-08: 13:00:00 via TOPICAL

## 2014-01-08 MED ORDER — PHENYLEPHRINE HCL 10 MG/ML IJ SOLN
10.0000 mg | INTRAVENOUS | Status: DC | PRN
  Administered 2014-01-08: 25 ug/min via INTRAVENOUS

## 2014-01-08 MED ORDER — ONDANSETRON HCL 4 MG/2ML IJ SOLN
INTRAMUSCULAR | Status: AC
  Filled 2014-01-08: qty 2

## 2014-01-08 MED ORDER — METHOCARBAMOL 500 MG PO TABS
500.0000 mg | ORAL_TABLET | Freq: Four times a day (QID) | ORAL | Status: DC | PRN
  Administered 2014-01-08 – 2014-01-10 (×6): 500 mg via ORAL
  Filled 2014-01-08 (×6): qty 1

## 2014-01-08 MED ORDER — LIDOCAINE HCL (CARDIAC) 20 MG/ML IV SOLN
INTRAVENOUS | Status: DC | PRN
  Administered 2014-01-08: 50 mg via INTRAVENOUS

## 2014-01-08 MED ORDER — FENTANYL CITRATE 0.05 MG/ML IJ SOLN
INTRAMUSCULAR | Status: DC | PRN
  Administered 2014-01-08: 100 ug via INTRAVENOUS
  Administered 2014-01-08: 50 ug via INTRAVENOUS

## 2014-01-08 MED ORDER — METOCLOPRAMIDE HCL 5 MG/ML IJ SOLN: 10.0000 mg | Freq: Once | INTRAMUSCULAR | Status: DC | PRN

## 2014-01-08 MED ORDER — ACETAMINOPHEN 10 MG/ML IV SOLN
INTRAVENOUS | Status: AC
  Administered 2014-01-08: 1000 mg via INTRAVENOUS
  Filled 2014-01-08: qty 100

## 2014-01-08 MED ORDER — METFORMIN HCL 500 MG PO TABS
1000.0000 mg | ORAL_TABLET | Freq: Two times a day (BID) | ORAL | Status: DC
  Administered 2014-01-09 – 2014-01-12 (×8): 1000 mg via ORAL
  Filled 2014-01-08 (×9): qty 2

## 2014-01-08 MED ORDER — HEMOSTATIC AGENTS (NO CHARGE) OPTIME
TOPICAL | Status: DC | PRN
  Administered 2014-01-08: 1 via TOPICAL

## 2014-01-08 MED ORDER — CLONIDINE HCL 0.1 MG PO TABS
0.1000 mg | ORAL_TABLET | Freq: Two times a day (BID) | ORAL | Status: DC
  Administered 2014-01-08 – 2014-01-12 (×7): 0.1 mg via ORAL
  Filled 2014-01-08 (×9): qty 1

## 2014-01-08 MED ORDER — DULOXETINE HCL 60 MG PO CPEP
60.0000 mg | ORAL_CAPSULE | Freq: Every day | ORAL | Status: DC
  Administered 2014-01-09 – 2014-01-12 (×4): 60 mg via ORAL
  Filled 2014-01-08 (×4): qty 1

## 2014-01-08 MED ORDER — ROCURONIUM BROMIDE 100 MG/10ML IV SOLN
INTRAVENOUS | Status: DC | PRN
  Administered 2014-01-08: 10 mg via INTRAVENOUS
  Administered 2014-01-08: 30 mg via INTRAVENOUS
  Administered 2014-01-08: 20 mg via INTRAVENOUS

## 2014-01-08 MED ORDER — ROCURONIUM BROMIDE 50 MG/5ML IV SOLN
INTRAVENOUS | Status: AC
  Filled 2014-01-08: qty 1

## 2014-01-08 MED ORDER — NEOSTIGMINE METHYLSULFATE 10 MG/10ML IV SOLN
INTRAVENOUS | Status: AC
  Filled 2014-01-08: qty 1

## 2014-01-08 MED ORDER — MORPHINE SULFATE 2 MG/ML IJ SOLN: 1.0000 mg | INTRAMUSCULAR | Status: DC | PRN

## 2014-01-08 MED ORDER — PREGABALIN 75 MG PO CAPS
150.0000 mg | ORAL_CAPSULE | Freq: Two times a day (BID) | ORAL | Status: DC
  Administered 2014-01-08 – 2014-01-12 (×8): 150 mg via ORAL
  Filled 2014-01-08 (×2): qty 2
  Filled 2014-01-08: qty 6
  Filled 2014-01-08 (×5): qty 2

## 2014-01-08 MED ORDER — METOCLOPRAMIDE HCL 5 MG/ML IJ SOLN: 5.0000 mg | Freq: Three times a day (TID) | INTRAMUSCULAR | Status: DC | PRN

## 2014-01-08 MED ORDER — BUPIVACAINE-EPINEPHRINE (PF) 0.25% -1:200000 IJ SOLN
INTRAMUSCULAR | Status: AC
  Filled 2014-01-08: qty 30

## 2014-01-08 MED ORDER — METHOCARBAMOL 1000 MG/10ML IJ SOLN
500.0000 mg | Freq: Four times a day (QID) | INTRAMUSCULAR | Status: DC | PRN
  Filled 2014-01-08: qty 5

## 2014-01-08 MED ORDER — ALBUMIN HUMAN 5 % IV SOLN
INTRAVENOUS | Status: DC | PRN
  Administered 2014-01-08: 14:00:00 via INTRAVENOUS

## 2014-01-08 MED ORDER — SIMVASTATIN 40 MG PO TABS
40.0000 mg | ORAL_TABLET | Freq: Every day | ORAL | Status: DC
  Administered 2014-01-08 – 2014-01-11 (×4): 40 mg via ORAL
  Filled 2014-01-08 (×5): qty 1

## 2014-01-08 MED ORDER — OXYCODONE HCL 5 MG PO TABS
5.0000 mg | ORAL_TABLET | ORAL | Status: DC | PRN
  Administered 2014-01-08 – 2014-01-10 (×12): 10 mg via ORAL
  Filled 2014-01-08 (×12): qty 2

## 2014-01-08 MED ORDER — ONDANSETRON HCL 4 MG/2ML IJ SOLN: 4.0000 mg | Freq: Four times a day (QID) | INTRAMUSCULAR | Status: DC | PRN

## 2014-01-08 MED ORDER — POLYETHYLENE GLYCOL 3350 17 G PO PACK: 17.0000 g | PACK | Freq: Every day | ORAL | Status: DC | PRN

## 2014-01-08 MED ORDER — LIDOCAINE HCL (CARDIAC) 20 MG/ML IV SOLN
INTRAVENOUS | Status: AC
  Filled 2014-01-08: qty 5

## 2014-01-08 MED ORDER — POTASSIUM CHLORIDE IN NACL 20-0.45 MEQ/L-% IV SOLN
INTRAVENOUS | Status: DC
  Administered 2014-01-08: 21:00:00 via INTRAVENOUS
  Filled 2014-01-08 (×9): qty 1000

## 2014-01-08 MED ORDER — CEFAZOLIN SODIUM 1-5 GM-% IV SOLN
1.0000 g | Freq: Three times a day (TID) | INTRAVENOUS | Status: AC
  Administered 2014-01-08 – 2014-01-09 (×2): 1 g via INTRAVENOUS
  Filled 2014-01-08 (×2): qty 50

## 2014-01-08 MED ORDER — LACTATED RINGERS IV SOLN
INTRAVENOUS | Status: DC | PRN
  Administered 2014-01-08 (×2): via INTRAVENOUS

## 2014-01-08 MED ORDER — HYDROMORPHONE HCL 1 MG/ML IJ SOLN
0.2500 mg | INTRAMUSCULAR | Status: DC | PRN
Start: 2014-01-08 — End: 2014-01-08

## 2014-01-08 MED ORDER — ONDANSETRON HCL 4 MG/2ML IJ SOLN
INTRAMUSCULAR | Status: DC | PRN
  Administered 2014-01-08: 4 mg via INTRAVENOUS

## 2014-01-08 MED ORDER — OXYCODONE HCL 5 MG/5ML PO SOLN: 5.0000 mg | Freq: Once | ORAL | Status: DC | PRN

## 2014-01-08 MED ORDER — ACETAMINOPHEN 10 MG/ML IV SOLN
1000.0000 mg | Freq: Four times a day (QID) | INTRAVENOUS | Status: AC
  Administered 2014-01-08 – 2014-01-09 (×3): 1000 mg via INTRAVENOUS
  Filled 2014-01-08 (×2): qty 100

## 2014-01-08 MED ORDER — HYDROMORPHONE HCL 1 MG/ML IJ SOLN
INTRAMUSCULAR | Status: AC
  Filled 2014-01-08: qty 2

## 2014-01-08 MED ORDER — FENTANYL CITRATE 0.05 MG/ML IJ SOLN
INTRAMUSCULAR | Status: AC
  Filled 2014-01-08: qty 5

## 2014-01-08 MED ORDER — DOCUSATE SODIUM 100 MG PO CAPS
100.0000 mg | ORAL_CAPSULE | Freq: Two times a day (BID) | ORAL | Status: DC
  Administered 2014-01-08 – 2014-01-12 (×8): 100 mg via ORAL
  Filled 2014-01-08 (×9): qty 1

## 2014-01-08 MED ORDER — SUCCINYLCHOLINE CHLORIDE 20 MG/ML IJ SOLN
INTRAMUSCULAR | Status: DC | PRN
  Administered 2014-01-08: 120 mg via INTRAVENOUS

## 2014-01-08 MED ORDER — ONDANSETRON HCL 4 MG PO TABS: 4.0000 mg | ORAL_TABLET | Freq: Four times a day (QID) | ORAL | Status: DC | PRN

## 2014-01-08 MED ORDER — SUCCINYLCHOLINE CHLORIDE 20 MG/ML IJ SOLN
INTRAMUSCULAR | Status: AC
  Filled 2014-01-08: qty 1

## 2014-01-08 MED ORDER — NEOSTIGMINE METHYLSULFATE 10 MG/10ML IV SOLN
INTRAVENOUS | Status: DC | PRN
  Administered 2014-01-08: 4 mg via INTRAVENOUS

## 2014-01-08 MED ORDER — PROPOFOL 10 MG/ML IV BOLUS
INTRAVENOUS | Status: DC | PRN
  Administered 2014-01-08: 150 mg via INTRAVENOUS

## 2014-01-08 MED ORDER — MIDAZOLAM HCL 2 MG/2ML IJ SOLN
INTRAMUSCULAR | Status: AC
  Filled 2014-01-08: qty 2

## 2014-01-08 SURGICAL SUPPLY — 58 items
BUR EGG ELITE 4.0 (BURR) IMPLANT
BUR MATCHSTICK NEURO 3.0 LAGG (BURR) IMPLANT
CANISTER SUCTION 2500CC (MISCELLANEOUS) ×2 IMPLANT
CLSR STERI-STRIP ANTIMIC 1/2X4 (GAUZE/BANDAGES/DRESSINGS) ×2 IMPLANT
CORDS BIPOLAR (ELECTRODE) ×2 IMPLANT
COVER SURGICAL LIGHT HANDLE (MISCELLANEOUS) ×2 IMPLANT
DRAIN CHANNEL 15F RND FF W/TCR (WOUND CARE) IMPLANT
DRAPE POUCH INSTRU U-SHP 10X18 (DRAPES) ×2 IMPLANT
DRAPE SURG 17X23 STRL (DRAPES) ×2 IMPLANT
DRAPE U-SHAPE 47X51 STRL (DRAPES) ×2 IMPLANT
DRSG MEPILEX BORDER 4X4 (GAUZE/BANDAGES/DRESSINGS) ×1 IMPLANT
DRSG MEPILEX BORDER 4X8 (GAUZE/BANDAGES/DRESSINGS) ×2 IMPLANT
DURAPREP 26ML APPLICATOR (WOUND CARE) ×2 IMPLANT
ELECT BLADE 4.0 EZ CLEAN MEGAD (MISCELLANEOUS)
ELECT CAUTERY BLADE 6.4 (BLADE) ×2 IMPLANT
ELECT PENCIL ROCKER SW 15FT (MISCELLANEOUS) ×2 IMPLANT
ELECT REM PT RETURN 9FT ADLT (ELECTROSURGICAL) ×2
ELECTRODE BLDE 4.0 EZ CLN MEGD (MISCELLANEOUS) IMPLANT
ELECTRODE REM PT RTRN 9FT ADLT (ELECTROSURGICAL) ×1 IMPLANT
EVACUATOR SILICONE 100CC (DRAIN) IMPLANT
GLOVE BIOGEL PI IND STRL 8 (GLOVE) ×1 IMPLANT
GLOVE BIOGEL PI IND STRL 8.5 (GLOVE) ×1 IMPLANT
GLOVE BIOGEL PI INDICATOR 8 (GLOVE) ×1
GLOVE BIOGEL PI INDICATOR 8.5 (GLOVE) ×1
GLOVE ECLIPSE 8.5 STRL (GLOVE) ×2 IMPLANT
GLOVE ORTHO TXT STRL SZ7.5 (GLOVE) ×2 IMPLANT
GOWN STRL REUS W/TWL 2XL LVL3 (GOWN DISPOSABLE) ×4 IMPLANT
KIT BASIN OR (CUSTOM PROCEDURE TRAY) ×2 IMPLANT
KIT ROOM TURNOVER OR (KITS) ×2 IMPLANT
KIT WRENCH (KITS) ×2 IMPLANT
NDL SPNL 18GX3.5 QUINCKE PK (NEEDLE) ×2 IMPLANT
NEEDLE 22X1 1/2 (OR ONLY) (NEEDLE) ×2 IMPLANT
NEEDLE SPNL 18GX3.5 QUINCKE PK (NEEDLE) ×4 IMPLANT
NS IRRIG 1000ML POUR BTL (IV SOLUTION) ×2 IMPLANT
PACK LAMINECTOMY ORTHO (CUSTOM PROCEDURE TRAY) ×2 IMPLANT
PACK UNIVERSAL I (CUSTOM PROCEDURE TRAY) ×2 IMPLANT
PAD ARMBOARD 7.5X6 YLW CONV (MISCELLANEOUS) ×4 IMPLANT
PATTIES SURGICAL .5 X.5 (GAUZE/BANDAGES/DRESSINGS) IMPLANT
PATTIES SURGICAL .5 X1 (DISPOSABLE) ×2 IMPLANT
SPONGE SURGIFOAM ABS GEL 100 (HEMOSTASIS) IMPLANT
SURGIFLO TRUKIT (HEMOSTASIS) ×1 IMPLANT
SUT BONE WAX W31G (SUTURE) ×2 IMPLANT
SUT ETHIBOND NAB CT1 #1 30IN (SUTURE) ×1 IMPLANT
SUT MON AB 3-0 SH 27 (SUTURE) ×2
SUT MON AB 3-0 SH27 (SUTURE) ×1 IMPLANT
SUT VIC AB 0 CT1 27 (SUTURE) ×2
SUT VIC AB 0 CT1 27XBRD ANBCTR (SUTURE) ×1 IMPLANT
SUT VIC AB 1 CT1 18XCR BRD 8 (SUTURE) ×1 IMPLANT
SUT VIC AB 1 CT1 8-18 (SUTURE) ×2
SUT VIC AB 1 CTX 36 (SUTURE) ×4
SUT VIC AB 1 CTX36XBRD ANBCTR (SUTURE) ×2 IMPLANT
SUT VIC AB 2-0 CT1 18 (SUTURE) ×2 IMPLANT
SYR BULB IRRIGATION 50ML (SYRINGE) ×2 IMPLANT
SYR CONTROL 10ML LL (SYRINGE) ×2 IMPLANT
TOWEL OR 17X24 6PK STRL BLUE (TOWEL DISPOSABLE) ×2 IMPLANT
TOWEL OR 17X26 10 PK STRL BLUE (TOWEL DISPOSABLE) ×2 IMPLANT
WATER STERILE IRR 1000ML POUR (IV SOLUTION) ×2 IMPLANT
YANKAUER SUCT BULB TIP NO VENT (SUCTIONS) ×2 IMPLANT

## 2014-01-08 NOTE — Transfer of Care (Signed)
Immediate Anesthesia Transfer of Care Note  Patient: James Parsons  Procedure(s) Performed: Procedure(s): L4-S1 Decompression with removal and reimplantation of spinal cord stimulator battery  (N/A)  Patient Location: PACU  Anesthesia Type:General  Level of Consciousness: awake, alert  and oriented  Airway & Oxygen Therapy: Patient Spontanous Breathing and Patient connected to nasal cannula oxygen  Post-op Assessment: Report given to PACU RN and Post -op Vital signs reviewed and stable  Post vital signs: Reviewed and stable  Complications: No apparent anesthesia complications

## 2014-01-08 NOTE — Brief Op Note (Signed)
01/08/2014  4:09 PM  PATIENT:  Horald Pollen  68 y.o. male  PRE-OPERATIVE DIAGNOSIS:  SPINAL STENOSIS L4-L5   POST-OPERATIVE DIAGNOSIS:  SPINAL STENOSIS L4-L5   PROCEDURE:  Procedure(s): L4-S1 Decompression with removal and reimplantation of spinal cord stimulator battery  (N/A)  SURGEON:  Surgeon(s) and Role:    * Melina Schools, MD - Primary  PHYSICIAN ASSISTANT:   ASSISTANTS: Benjiman Core   ANESTHESIA:   general  EBL:  Total I/O In: 2050 [I.V.:1800; IV Piggyback:250] Out: 660 [Urine:580; Blood:80]  BLOOD ADMINISTERED:none  DRAINS: none   LOCAL MEDICATIONS USED:  MARCAINE     SPECIMEN:  No Specimen  DISPOSITION OF SPECIMEN:  N/A  COUNTS:  YES  TOURNIQUET:  * No tourniquets in log *  DICTATION: .Other Dictation: Dictation Number [id number]  PLAN OF CARE: Admit to inpatient   PATIENT DISPOSITION:  PACU - hemodynamically stable.

## 2014-01-08 NOTE — Anesthesia Preprocedure Evaluation (Addendum)
Anesthesia Evaluation  Patient identified by MRN, date of birth, ID band Patient awake    Reviewed: Allergy & Precautions, H&P , NPO status , Patient's Chart, lab work & pertinent test results  Airway Mallampati: II TM Distance: >3 FB Neck ROM: Full    Dental  (+) Edentulous Upper, Edentulous Lower   Pulmonary former smoker,  breath sounds clear to auscultation        Cardiovascular hypertension, Pt. on medications Rhythm:Regular Rate:Normal     Neuro/Psych Depression negative neurological ROS     GI/Hepatic negative GI ROS, Neg liver ROS,   Endo/Other  diabetes, Type 2, Oral Hypoglycemic AgentsMorbid obesity  Renal/GU negative Renal ROS     Musculoskeletal  (+) Arthritis -,   Abdominal   Peds  Hematology negative hematology ROS (+)   Anesthesia Other Findings   Reproductive/Obstetrics                          Anesthesia Physical Anesthesia Plan  ASA: III  Anesthesia Plan: General   Post-op Pain Management:    Induction: Intravenous  Airway Management Planned: Oral ETT  Additional Equipment:   Intra-op Plan:   Post-operative Plan: Extubation in OR  Informed Consent: I have reviewed the patients History and Physical, chart, labs and discussed the procedure including the risks, benefits and alternatives for the proposed anesthesia with the patient or authorized representative who has indicated his/her understanding and acceptance.   Dental advisory given  Plan Discussed with: CRNA and Surgeon  Anesthesia Plan Comments:         Anesthesia Quick Evaluation

## 2014-01-08 NOTE — Anesthesia Postprocedure Evaluation (Signed)
Anesthesia Post Note  Patient: James Parsons  Procedure(s) Performed: Procedure(s) (LRB): L4-S1 Decompression with removal and reimplantation of spinal cord stimulator battery  (N/A)  Anesthesia type: general  Patient location: PACU  Post pain: Pain level controlled  Post assessment: Patient's Cardiovascular Status Stable  Last Vitals:  Filed Vitals:   01/08/14 1600  BP: 116/50  Pulse: 79  Temp: 36.7 C  Resp: 12    Post vital signs: Reviewed and stable  Level of consciousness: sedated  Complications: No apparent anesthesia complications

## 2014-01-08 NOTE — Anesthesia Procedure Notes (Signed)
Procedure Name: Intubation Date/Time: 01/08/2014 1:25 PM Performed by: Eligha Bridegroom Pre-anesthesia Checklist: Timeout performed, Patient identified, Emergency Drugs available, Suction available and Patient being monitored Patient Re-evaluated:Patient Re-evaluated prior to inductionOxygen Delivery Method: Circle system utilized Preoxygenation: Pre-oxygenation with 100% oxygen Intubation Type: IV induction Ventilation: Mask ventilation without difficulty and Oral airway inserted - appropriate to patient size Laryngoscope Size: Mac and 4 Grade View: Grade II Tube type: Oral Tube size: 7.5 mm Number of attempts: 1 Airway Equipment and Method: Stylet Placement Confirmation: ETT inserted through vocal cords under direct vision,  breath sounds checked- equal and bilateral and positive ETCO2 Secured at: 23 cm Tube secured with: Tape Dental Injury: Teeth and Oropharynx as per pre-operative assessment

## 2014-01-09 ENCOUNTER — Encounter (HOSPITAL_COMMUNITY): Payer: Self-pay | Admitting: Orthopedic Surgery

## 2014-01-09 DIAGNOSIS — F329 Major depressive disorder, single episode, unspecified: Secondary | ICD-10-CM | POA: Diagnosis not present

## 2014-01-09 DIAGNOSIS — M4806 Spinal stenosis, lumbar region: Secondary | ICD-10-CM | POA: Diagnosis present

## 2014-01-09 DIAGNOSIS — J449 Chronic obstructive pulmonary disease, unspecified: Secondary | ICD-10-CM | POA: Diagnosis not present

## 2014-01-09 DIAGNOSIS — I10 Essential (primary) hypertension: Secondary | ICD-10-CM | POA: Diagnosis not present

## 2014-01-09 DIAGNOSIS — E785 Hyperlipidemia, unspecified: Secondary | ICD-10-CM | POA: Diagnosis not present

## 2014-01-09 DIAGNOSIS — E119 Type 2 diabetes mellitus without complications: Secondary | ICD-10-CM | POA: Diagnosis not present

## 2014-01-09 DIAGNOSIS — K5900 Constipation, unspecified: Secondary | ICD-10-CM | POA: Diagnosis not present

## 2014-01-09 DIAGNOSIS — Y831 Surgical operation with implant of artificial internal device as the cause of abnormal reaction of the patient, or of later complication, without mention of misadventure at the time of the procedure: Secondary | ICD-10-CM | POA: Diagnosis not present

## 2014-01-09 DIAGNOSIS — T8584XA Pain due to internal prosthetic devices, implants and grafts, not elsewhere classified, initial encounter: Secondary | ICD-10-CM | POA: Diagnosis not present

## 2014-01-09 DIAGNOSIS — Z87891 Personal history of nicotine dependence: Secondary | ICD-10-CM | POA: Diagnosis not present

## 2014-01-09 LAB — GLUCOSE, CAPILLARY
GLUCOSE-CAPILLARY: 164 mg/dL — AB (ref 70–99)
Glucose-Capillary: 218 mg/dL — ABNORMAL HIGH (ref 70–99)
Glucose-Capillary: 182 mg/dL — ABNORMAL HIGH (ref 70–99)
Glucose-Capillary: 147 mg/dL — ABNORMAL HIGH (ref 70–99)

## 2014-01-09 LAB — BASIC METABOLIC PANEL
ANION GAP: 14 (ref 5–15)
BUN: 10 mg/dL (ref 6–23)
CHLORIDE: 98 meq/L (ref 96–112)
CO2: 26 meq/L (ref 19–32)
Calcium: 9.5 mg/dL (ref 8.4–10.5)
Creatinine, Ser: 0.56 mg/dL (ref 0.50–1.35)
GFR calc Af Amer: >90 mL/min (ref >90)
GFR calc non Af Amer: >90 mL/min (ref >90)
Glucose, Bld: 169 mg/dL — ABNORMAL HIGH (ref 70–99)
Potassium: 4.6 mEq/L (ref 3.7–5.3)
SODIUM: 138 meq/L (ref 137–147)

## 2014-01-09 NOTE — Progress Notes (Signed)
UR completed 

## 2014-01-09 NOTE — Evaluation (Signed)
Physical Therapy Evaluation Patient Details Name: James Parsons MRN: [id number] DOB: Feb 02, 1946 Today's Date: 01/09/2014   History of Present Illness  pt presents with L4-S1 Decompression and Facetotomy and re-insertion of stimulator battery.    Clinical Impression  Pt moving great and demos good safety.  Reviewed back precautions and pt demo'd good follow through during session.  No further acute PT needs at this time, will sign off.  Pt ready for D/C from PT stand point.      Follow Up Recommendations No PT follow up;Supervision - Intermittent    Equipment Recommendations  None recommended by PT    Recommendations for Other Services       Precautions / Restrictions Precautions Precautions: Back Precaution Booklet Issued: Yes (comment) Required Braces or Orthoses: Spinal Brace Spinal Brace: Lumbar corset;Applied in sitting position Restrictions Weight Bearing Restrictions: No      Mobility  Bed Mobility               General bed mobility comments: pt in chair on arrival.    Transfers Overall transfer level: Modified independent Equipment used: Rolling walker (2 wheeled)             General transfer comment: Demos good safety.    Ambulation/Gait Ambulation/Gait assistance: Modified independent (Device/Increase time) Ambulation Distance (Feet): 200 Feet Assistive device: Rolling walker (2 wheeled) Gait Pattern/deviations: Step-through pattern;Decreased stride length     General Gait Details: pt demos good use of RW and safety.    Stairs Stairs: Yes Stairs assistance: Min guard Stair Management: No rails;Step to pattern;Forwards;With walker Number of Stairs: 3 General stair comments: pt demos how he uses RW on stairs at home.  pt encouraged to use home entry with only single step and to have wife present if using 3 step entry.    Wheelchair Mobility    Modified Rankin (Stroke Patients Only)       Balance                                              Pertinent Vitals/Pain Pain Assessment: 0-10 Pain Score: 2  Pain Location: Back Pain Descriptors / Indicators: Aching Pain Intervention(s): Premedicated before session;Repositioned    Home Living Family/patient expects to be discharged to:: Private residence Living Arrangements: Spouse/significant other Available Help at Discharge: Family;Available PRN/intermittently Type of Home: House Home Access: Stairs to enter Entrance Stairs-Rails: None Entrance Stairs-Number of Steps: 1 Home Layout: One level Home Equipment: Cane - single point;Walker - 2 wheels;Cane - quad;Bedside commode      Prior Function Level of Independence: Independent with assistive device(s)               Hand Dominance   Dominant Hand: Right    Extremity/Trunk Assessment   Upper Extremity Assessment: Overall WFL for tasks assessed           Lower Extremity Assessment: Overall WFL for tasks assessed      Cervical / Trunk Assessment: Normal  Communication   Communication: No difficulties  Cognition Arousal/Alertness: Awake/alert Behavior During Therapy: WFL for tasks assessed/performed Overall Cognitive Status: Within Functional Limits for tasks assessed                      General Comments      Exercises        Assessment/Plan    PT Assessment Patent  does not need any further PT services  PT Diagnosis     PT Problem List    PT Treatment Interventions     PT Goals (Current goals can be found in the Care Plan section) Acute Rehab PT Goals PT Goal Formulation: No goals set, d/c therapy    Frequency     Barriers to discharge        Co-evaluation               End of Session Equipment Utilized During Treatment: Gait belt;Back brace Activity Tolerance: Patient tolerated treatment well Patient left: in chair;with call bell/phone within reach;with family/visitor present Nurse Communication: Mobility status    Functional Assessment  Tool Used: Clinical Judgement Functional Limitation: Mobility: Walking and moving around Mobility: Walking and Moving Around Current Status [phone]): 0 percent impaired, limited or restricted Mobility: Walking and Moving Around Goal Status [phone]): 0 percent impaired, limited or restricted Mobility: Walking and Moving Around Discharge Status [phone]): 0 percent impaired, limited or restricted    Time: 0936-1009 PT Time Calculation (min): 33 min   Charges:   PT Evaluation $Initial PT Evaluation Tier I: 1 Procedure PT Treatments $Gait Training: 23-37 mins   PT G Codes:   Functional Assessment Tool Used: Clinical Judgement Functional Limitation: Mobility: Walking and moving around    PellstonThornton Papas, [place] [phone] 01/09/2014, 11:09 AM

## 2014-01-09 NOTE — Op Note (Signed)
James Parsons, James Parsons NO.:  [id number]  MEDICAL RECORD NO.:  [id number]  LOCATION:  [id number]                        FACILITY:  [facility]  PHYSICIAN:  Dahlia Bailiff, MD    DATE OF BIRTH:  05/14/1945  DATE OF PROCEDURE:  01/08/2014 DATE OF DISCHARGE:                              OPERATIVE REPORT   PREOPERATIVE DIAGNOSES:  Lumbar spinal stenosis, L4-5 and symptomatic hardware spinal cord stimulator battery.  POSTOPERATIVE DIAGNOSIS:  Lumbar spinal stenosis, L4-5 and symptomatic hardware spinal cord stimulator battery.  OPERATIVE PROCEDURE:  L4 to S1 decompression with foraminotomy and facetectomy, right side, L4-5 and removal and reinsertion of spinal cord stimulator battery.  FIRST ASSISTANT:  Alyson Locket. Velora Heckler.  HISTORY:  This is a very pleasant gentleman who has a significant back, buttock, and bilateral leg pain, right side worse than left.  CT myelogram demonstrated severe spinal stenosis at L4-5.  He had another moderate degrees of stenosis, but this was the most severe.  After discussing treatment options, we elected to proceed with the aforementioned surgery.  All appropriate risks, benefits, and alternatives were discussed, and consent was obtained.  OPERATIVE NOTE:  The patient was brought to the operating room, placed supine on the operating table.  After successful induction of general anesthesia, endotracheal intubation, TEDs, SCDs, and Foley were inserted.  He was turned prone onto the Wilson frame and all bony prominences were well padded.  The back was prepped and draped in a standard fashion.  Time-out was taken confirming patient, procedure, and all other pertinent important data.  A midline incision was made, centered over the L4-5 level.  Sharp dissection was carried out down to the deep fascia.  I identified the subcutaneously placed morphine pump __________ protected it.  I continued my dissection until I was down to the deep fascia.  I  incised the deep fascia and stripped the paraspinal muscles to expose the L4 and L5 spinous processes and a portion of that of L3.  Once this was done bilaterally, I then took an x-ray to confirm that I was at the appropriate level.  A double-action Leksell rongeur was used to then remove the entire L4 spinous process.  Significant overgrowth and bony arthritis of the facet joints especially on the right side.  There was also significant buckling of the ligamentum flavum.  I used a fine curette to develop a plane underneath the L4 lamina.  I then performed a subtotal laminectomy of L4.  There was a very large osteophyte and so I resected about 75% of the right L4 inferior facet complex in order to adequately decompress this area.  I did not require significant facet removal on the left side.  I was able to freely get down into the lateral recess and continue using my Kerrison punches to resect the overgrowing osteophytes.  Once I was in the lateral recess and I had to decompress, I palpated the L4 pedicle and the L5 pedicle, and I was able to pass my Woodson elevator inferiorly along the L5 nerve root. However, centrally, there was still significant adherent bony spur.  I attempted to try and get underneath this, but I could not.  As such, I went and removed the spinous process of L5 and then performed decompression working from the caudal end.  As I was removing the L5 vertebral posterior elements, the portion of it was directly adherent to the thecal sac.  Using [facility] elevators, I was able to freely gently dissect the bony spicules from off of the thecal sac. There was no evidence of any CSF leak.  Once I decompressed the central canal, I then went into the lateral recess and made sure the L5 nerve root was completely decompressed.  At this point, I could freely take the [facility] elevator superiorly along the lateral recess and inferiorly on both sides, and there was no  __________ tension.  At this point, I irrigated the wound copiously with normal saline.  Because I did not resect more than one complete facet complex, I elected not to do in situ fusion.  I then made a second incision through his previous battery site and then delivered the battery out of the wound.  Unfortunately, there was not enough lead to move it to the contralateral side; however, I did relocate it.  I moved it 90 degrees and placed it superiorly.  I sealed that into the new pocket with #1 Ethibond sutures and then sealed the pocket shut.  All wounds were copiously irrigated with normal saline, and hemostasis was obtained using bipolar electrocautery.  I then closed both wounds in a layered fashion with interrupted #1 Vicryl sutures, 2-0 Vicryl sutures, and 3-0 Monocryl.  Steri-Strips and dry dressings were applied.  The patient was ultimately extubated, transferred to PACU without incident.  At the end of the case, all needle and sponge counts were correct.  There were no adverse intraoperative events.     Dahlia Bailiff, MD     DDB/MEDQ  D:  01/08/2014  T:  01/09/2014  Job:  [id number]

## 2014-01-09 NOTE — Progress Notes (Signed)
Subjective: Patient doing well.  Pain controlled.  Making progress with PT.    Objective: Vital signs in last 24 hours: Temp:  [98 F (36.7 C)-98.6 F (37 C)] 98.2 F (36.8 C) (10/01 1300) Pulse Rate:  [69-97] 97 (10/01 1300) Resp:  [9-16] 16 (10/01 1300) BP: (100-142)/(50-91) 100/50 mmHg (10/01 1300) SpO2:  [91 %-97 %] 93 % (10/01 1300)  Intake/Output from previous day: 09/30 0701 - 10/01 0700 In: 2370 [P.O.:320; I.V.:1800; IV Piggyback:250] Out: 5284 [Urine:4830; Blood:80] Intake/Output this shift: Total I/O In: 2118.8 [P.O.:960; I.V.:758.8; IV Piggyback:400] Out: -   No results found for this basename: HGB,  in the last 72 hours No results found for this basename: WBC, RBC, HCT, PLT,  in the last 72 hours  Recent Labs  01/09/14 0645  NA 138  K 4.6  CL 98  CO2 26  BUN 10  CREATININE 0.56  GLUCOSE 169*  CALCIUM 9.5   No results found for this basename: LABPT, INR,  in the last 72 hours  Exam:  Dressings C/D/I.   NVI.     Assessment/Plan: Anticipate d/c home tomorrow.  Continue present care.     James Parsons M 01/09/2014, 2:45 PM

## 2014-01-10 DIAGNOSIS — M4806 Spinal stenosis, lumbar region: Secondary | ICD-10-CM | POA: Diagnosis not present

## 2014-01-10 LAB — GLUCOSE, CAPILLARY
GLUCOSE-CAPILLARY: 164 mg/dL — AB (ref 70–99)
GLUCOSE-CAPILLARY: 143 mg/dL — AB (ref 70–99)
Glucose-Capillary: 151 mg/dL — ABNORMAL HIGH (ref 70–99)
Glucose-Capillary: 138 mg/dL — ABNORMAL HIGH (ref 70–99)

## 2014-01-10 LAB — URINALYSIS, ROUTINE W REFLEX MICROSCOPIC
Bilirubin Urine: NEGATIVE
Glucose, UA: NEGATIVE mg/dL
Hgb urine dipstick: NEGATIVE
Ketones, ur: NEGATIVE mg/dL
LEUKOCYTES UA: NEGATIVE
NITRITE: NEGATIVE
Protein, ur: NEGATIVE mg/dL
SPECIFIC GRAVITY, URINE: 1.011 (ref 1.005–1.030)
UROBILINOGEN UA: 0.2 mg/dL (ref 0.0–1.0)
pH: 7 (ref 5.0–8.0)

## 2014-01-10 MED ORDER — LIDOCAINE 5 % EX PTCH: 1.0000 | MEDICATED_PATCH | CUTANEOUS | Status: DC

## 2014-01-10 MED ORDER — DIAZEPAM 5 MG PO TABS
10.0000 mg | ORAL_TABLET | Freq: Three times a day (TID) | ORAL | Status: DC | PRN
  Administered 2014-01-10 – 2014-01-12 (×3): 10 mg via ORAL
  Filled 2014-01-10 (×3): qty 2

## 2014-01-10 MED ORDER — MAGNESIUM CITRATE PO SOLN
1.0000 | Freq: Once | ORAL | Status: AC
  Administered 2014-01-10: 1 via ORAL
  Filled 2014-01-10: qty 296

## 2014-01-10 MED ORDER — OXYCODONE HCL 5 MG PO TABS
10.0000 mg | ORAL_TABLET | ORAL | Status: DC | PRN
  Administered 2014-01-10 – 2014-01-12 (×9): 10 mg via ORAL
  Filled 2014-01-10 (×9): qty 2

## 2014-01-10 NOTE — Progress Notes (Signed)
    Subjective: Procedure(s) (LRB): L4-S1 Decompression with removal and reimplantation of spinal cord stimulator battery  (N/A) 2 Days Post-Op  Patient reports pain as 5 on 0-10 scale.  Reports deferred at this time leg pain reports incisional back pain   Positive void Negative bowel movement Positive flatus Negative chest pain or shortness of breath  Objective: Vital signs in last 24 hours: Temp:  [97.9 F (36.6 C)-100.3 F (37.9 C)] 97.9 F (36.6 C) (10/02 1356) Pulse Rate:  [91-105] 95 (10/02 1356) Resp:  [14-18] 14 (10/02 1019) BP: (120-147)/(61-63) 120/63 mmHg (10/02 1356) SpO2:  [91 %-93 %] 93 % (10/02 1356)  Intake/Output from previous day: 10/01 0701 - 10/02 0700 In: 3607.5 [P.O.:1680; I.V.:1527.5; IV Piggyback:400] Out: 2250 [Urine:2250]  Labs: No results found for this basename: WBC, RBC, HCT, PLT,  in the last 72 hours  Recent Labs  01/09/14 0645  NA 138  K 4.6  CL 98  CO2 26  BUN 10  CREATININE 0.56  GLUCOSE 169*  CALCIUM 9.5   No results found for this basename: LABPT, INR,  in the last 72 hours  Physical Exam: Neurologically intact ABD soft Intact pulses distally Incision: dressing C/D/I Compartment soft  Assessment/Plan: Patient stable  xrays n/a Continue mobilization with physical therapy Continue care  Up with therapy Discharge to SNF Change robaxin to valium for muscle spasms Constipation - fleets enema and mag citrate Continue mobilization    Melina Schools, MD [place] [phone]

## 2014-01-10 NOTE — Clinical Social Work Note (Signed)
CSW spoke with pt's PA regarding discharge disposition. PT to re-evaluate pt for possible SNF placement. Per PA, pt and pt's family prefer [facility]. CSW spoke with [facility] admissions liaison who are now following pt for possible SNF placement, pending PT re-evaluation.  CSW to continue to follow and assist with discharge planning needs.  Lubertha Sayres, [place] [phone]) Licensed Clinical Social Worker Neuroscience [phone]) and Medical ICU ([phone])

## 2014-01-10 NOTE — Progress Notes (Signed)
Subjective: Seen earlier.  Patient moving slow today.  Had not worked with PT.  Patient and wife thinking he may need short SNF placement.  Also having some issues with urinary retention.  No incontinence.     Objective: Vital signs in last 24 hours: Temp:  [97.9 F (36.6 C)-100.3 F (37.9 C)] 97.9 F (36.6 C) (10/02 1356) Pulse Rate:  [91-105] 95 (10/02 1356) Resp:  [14-18] 14 (10/02 1019) BP: (120-147)/(61-63) 120/63 mmHg (10/02 1356) SpO2:  [91 %-93 %] 93 % (10/02 1356)  Intake/Output from previous day: 10/01 0701 - 10/02 0700 In: 3607.5 [P.O.:1680; I.V.:1527.5; IV Piggyback:400] Out: 2250 [Urine:2250] Intake/Output this shift: Total I/O In: 480 [P.O.:480] Out: 100 [Urine:100]  No results found for this basename: HGB,  in the last 72 hours No results found for this basename: WBC, RBC, HCT, PLT,  in the last 72 hours  Recent Labs  01/09/14 0645  NA 138  K 4.6  CL 98  CO2 26  BUN 10  CREATININE 0.56  GLUCOSE 169*  CALCIUM 9.5   No results found for this basename: LABPT, INR,  in the last 72 hours  Exam:  Wounds look good.  steris intact.  Nvi.  abd soft, nontender.    Assessment/Plan: Check UA and urine C&S.  Bladder scan.  May need short snf placement.  Need PT to reevaluate patient.     [name],[name] 01/10/2014, 3:15 PM

## 2014-01-11 DIAGNOSIS — M4806 Spinal stenosis, lumbar region: Secondary | ICD-10-CM | POA: Diagnosis not present

## 2014-01-11 LAB — URINE CULTURE
COLONY COUNT: NO GROWTH
Culture: NO GROWTH

## 2014-01-11 LAB — GLUCOSE, CAPILLARY
GLUCOSE-CAPILLARY: 151 mg/dL — AB (ref 70–99)
GLUCOSE-CAPILLARY: 139 mg/dL — AB (ref 70–99)
Glucose-Capillary: 139 mg/dL — ABNORMAL HIGH (ref 70–99)
Glucose-Capillary: 144 mg/dL — ABNORMAL HIGH (ref 70–99)

## 2014-01-11 NOTE — Evaluation (Signed)
Physical Therapy Re-Evaluation Patient Details Name: James Parsons MRN: [id number] DOB: 08-11-1945 Today's Date: 01/11/2014   History of Present Illness  pt presents with L4-S1 Decompression and Facetotomy and re-insertion of stimulator battery.  Was evaluated by PT on 01/09/14 and d/c as pt was mobilizing at a highly functional level. Pt with notable decline in functional ability 01/10/14 and PT re-evaluation was ordered for recommendations.  Clinical Impression  Pt seen for PT re-evaluation for reasons listed above. Pt currently with functional limitations due to the deficits listed below (see PT Problem List). Pt presents with obvious decline in functional mobility since PT evaluation. He now requires min-mod assist for bed mobility and transfers; unable to ambulate due to LE weakness and buckling. Also presents with right lateral lean in sitting requiring frequent correction. He is no longer safe to d/c home from a mobility standpoint and therefore recommend SNF for additional rehabilitation. Pt will benefit from skilled PT to increase their independence and safety with mobility.     Follow Up Recommendations SNF;Supervision for mobility/OOB    Equipment Recommendations  None recommended by PT    Recommendations for Other Services       Precautions / Restrictions Precautions Precautions: Back Precaution Booklet Issued: Yes (comment) Required Braces or Orthoses: Spinal Brace Spinal Brace: Lumbar corset;Applied in sitting position Restrictions Weight Bearing Restrictions: No      Mobility  Bed Mobility Overal bed mobility: Needs Assistance Bed Mobility: Rolling;Sidelying to Sit Rolling: Min assist Sidelying to sit: Min assist       General bed mobility comments: Min assist with education for log roll technique to maintain neutral back alignment. Min assist required for trunk support into seated position. Leaning moderately towards his right side.  Transfers Overall  transfer level: Needs assistance Equipment used: Rolling walker (2 wheeled) Transfers: Sit to/from [facility] Sit to Stand: Mod assist Stand pivot transfers: Min assist       General transfer comment: Sit<>stand mod assist x2 for boost to stand, poor LE support requring heavy use of UEs on walker with notable buckling. Able to transfer to chair with min assist on second attempt after period of rest on edge of bed. Again buckling noted requiring min assist for support and walker control.  Ambulation/Gait             General Gait Details: Pt unable to ambulate secondary to leg weakness and buckling.  Stairs            Wheelchair Mobility    Modified Rankin (Stroke Patients Only)       Balance Overall balance assessment: Needs assistance Sitting-balance support: Single extremity supported;Feet supported Sitting balance-Leahy Scale: Poor   Postural control: Right lateral lean Standing balance support: Bilateral upper extremity supported Standing balance-Leahy Scale: Poor                               Pertinent Vitals/Pain Pain Assessment: 0-10 Pain Score: 5  Pain Location: back Pain Descriptors / Indicators: Constant Pain Intervention(s): Limited activity within patient's tolerance;Monitored during session;Repositioned    Home Living Family/patient expects to be discharged to:: Skilled nursing facility Living Arrangements: Spouse/significant other Available Help at Discharge: Family;Available PRN/intermittently Type of Home: House Home Access: Stairs to enter Entrance Stairs-Rails: None Entrance Stairs-Number of Steps: 1 Home Layout: One level Home Equipment: Cane - single point;Walker - 2 wheels;Cane - quad;Bedside commode      Prior Function Level of Independence:  Independent with assistive device(s)               Hand Dominance   Dominant Hand: Right    Extremity/Trunk Assessment   Upper Extremity Assessment:  Defer to OT evaluation           Lower Extremity Assessment: Generalized weakness (RLE weaker thand LLE - MMT grossly >3/5)      Cervical / Trunk Assessment: Normal  Communication   Communication: No difficulties  Cognition Arousal/Alertness: Lethargic Behavior During Therapy: WFL for tasks assessed/performed Overall Cognitive Status: Within Functional Limits for tasks assessed                      General Comments General comments (skin integrity, edema, etc.): Obvious decline in functional ability from previous PT evaluation - pt unable to ambulate or even stand without assist. Now presents with moderate lateral list towards pts right side requiring frequent correction.    Exercises        Assessment/Plan    PT Assessment Patient needs continued PT services  PT Diagnosis Difficulty walking;Acute pain;Generalized weakness   PT Problem List Decreased strength;Decreased range of motion;Decreased activity tolerance;Decreased balance;Decreased mobility;Decreased knowledge of use of DME;Decreased knowledge of precautions;Pain;Obesity  PT Treatment Interventions DME instruction;Gait training;Functional mobility training;Therapeutic activities;Therapeutic exercise;Balance training;Neuromuscular re-education;Patient/family education;Modalities   PT Goals (Current goals can be found in the Care Plan section) Acute Rehab PT Goals Patient Stated Goal: Get better PT Goal Formulation: With patient Time For Goal Achievement: 01/18/14 Potential to Achieve Goals: Good    Frequency Min 3X/week   Barriers to discharge Other (comment) Pt too low level of function for wife to safely care for him at home    Co-evaluation               End of Session Equipment Utilized During Treatment: Gait belt;Back brace Activity Tolerance: Patient limited by fatigue Patient left: in chair;with call bell/phone within reach;with family/visitor present Nurse Communication: Mobility  status    Functional Assessment Tool Used: Clinical Judgement Functional Limitation: Mobility: Walking and moving around Mobility: Walking and Moving Around Current Status [phone]): At least 60 percent but less than 80 percent impaired, limited or restricted Mobility: Walking and Moving Around Goal Status [phone]): At least 20 percent but less than 40 percent impaired, limited or restricted    Time: 0954-1016 PT Time Calculation (min): 22 min   Charges:   PT Evaluation $PT Re-evaluation: 1 Procedure     PT G Codes:   Functional Assessment Tool Used: Clinical Judgement Functional Limitation: Mobility: Walking and moving around   Holley, [place]  Ellouise Newer 01/11/2014, 10:37 AM

## 2014-01-11 NOTE — Discharge Summary (Signed)
Patient ID: James Parsons MRN: [id number] DOB/AGE: 1946-01-23 68 y.o.  Admit date: 01/08/2014 Discharge date: 01/11/2014  Admission Diagnoses:  Active Problems:   History of lumbar laminectomy for spinal cord decompression   Discharge Diagnoses:  Active Problems:   History of lumbar laminectomy for spinal cord decompression  status post Procedure(s): L4-S1 Decompression with removal and reimplantation of spinal cord stimulator battery   Past Medical History  Diagnosis Date  . Anemia   . Arthritis   . Blood transfusion   . Clotting disorder   . Diabetes mellitus   . Borderline hypertension   . Venous insufficiency   . Hyperlipidemia   . Obesity   . Diverticulosis of colon   . Hx of colonic polyps   . Low back pain   . Lipoma   . RSD (reflex sympathetic dystrophy)   . Mental disorder   . Depression     hx of   . COPD (chronic obstructive pulmonary disease)     denies  . Pneumonia     hx    Surgeries: Procedure(s): L4-S1 Decompression with removal and reimplantation of spinal cord stimulator battery  on 01/08/2014   Consultants:    Discharged Condition: Improved  Hospital Course: KIPTYN RAFUSE is an 68 y.o. male who was admitted 01/08/2014 for operative treatment of <principal problem not specified>. Patient failed conservative treatments (please see the history and physical for the specifics) and had severe unremitting pain that affects sleep, daily activities and work/hobbies. After pre-op clearance, the patient was taken to the operating room on 01/08/2014 and underwent  Procedure(s): L4-S1 Decompression with removal and reimplantation of spinal cord stimulator battery .    Patient was given perioperative antibiotics: Anti-infectives   Start     Dose/Rate Route Frequency Ordered Stop   01/08/14 2000  ceFAZolin (ANCEF) IVPB 1 g/50 mL premix     1 g 100 mL/hr over 30 Minutes Intravenous Every 8 hours 01/08/14 1839 01/09/14 0356   01/07/14 1339  ceFAZolin  (ANCEF) IVPB 2 g/50 mL premix     2 g 100 mL/hr over 30 Minutes Intravenous 30 min pre-op 01/07/14 1339 01/08/14 1245       Patient was given sequential compression devices and early ambulation to prevent DVT.   Patient benefited maximally from hospital stay and there were no complications. At the time of discharge, the patient was urinating/moving their bowels without difficulty, tolerating a regular diet, pain is controlled with oral pain medications and they have been cleared by PT/OT.   Recent vital signs: Patient Vitals for the past 24 hrs:  BP Temp Temp src Pulse Resp SpO2  01/11/14 0535 115/57 mmHg 98.1 F (36.7 C) Oral 102 16 90 %  01/10/14 2033 100/65 mmHg 98.5 F (36.9 C) Oral 95 - 90 %  01/10/14 1356 120/63 mmHg 97.9 F (36.6 C) Oral 95 - 93 %  01/10/14 1019 121/61 mmHg - - 91 14 91 %     Recent laboratory studies:  Recent Labs  01/09/14 0645  NA 138  K 4.6  CL 98  CO2 26  BUN 10  CREATININE 0.56  GLUCOSE 169*  CALCIUM 9.5     Discharge Medications:     Medication List    STOP taking these medications       aspirin 81 MG chewable tablet     celecoxib 200 MG capsule  Commonly known as:  CELEBREX     Cinnamon 500 MG capsule  TAKE these medications       cloNIDine 0.1 MG tablet  Commonly known as:  CATAPRES  Take 0.1 mg by mouth 2 (two) times daily.     cyclobenzaprine 10 MG tablet  Commonly known as:  FLEXERIL  Take 10 mg by mouth 3 (three) times daily as needed for muscle spasms. Muscle spasm.     DULoxetine 60 MG capsule  Commonly known as:  CYMBALTA  Take 60 mg by mouth daily.     ENDOCET 10-325 MG per tablet  Generic drug:  oxyCODONE-acetaminophen  Take 1 tablet by mouth 3 (three) times daily as needed. pain     fentaNYL 0.05 MG/ML SOLN 5,000 mcg  Inject into the skin continuous. Patient will bring dosage instructions with him. Daily dose 0.84mg      glimepiride 2 MG tablet  Commonly known as:  AMARYL  Take 4 mg by mouth daily  with breakfast.     lidocaine 5 %  Commonly known as:  LIDODERM  Place 1 patch onto the skin daily. Remove & Discard patch within 12 hours or as directed by MD     LYRICA 150 MG capsule  Generic drug:  pregabalin  Take 150 mg by mouth 2 (two) times daily.     metFORMIN 500 MG tablet  Commonly known as:  GLUCOPHAGE  Take 1,000 mg by mouth 2 (two) times daily with a meal.     simvastatin 40 MG tablet  Commonly known as:  ZOCOR  Take 40 mg by mouth daily.     sitaGLIPtin 100 MG tablet  Commonly known as:  JANUVIA  Take 50 mg by mouth daily.     tamsulosin 0.4 MG Caps capsule  Commonly known as:  FLOMAX  Take 0.4 mg by mouth daily.     traMADol 50 MG tablet  Commonly known as:  ULTRAM  Take 50 mg by mouth 3 (three) times daily as needed. pain     Vitamin D-3 5000 UNITS Tabs  Take 5,000 Units by mouth 2 (two) times daily.        Diagnostic Studies: Dg Lumbar Spine 1 View  2014-01-24   CLINICAL DATA:  Intraoperative localization for spine surgery.  EXAM: LUMBAR SPINE - 1 VIEW  COMPARISON:  CT scan 09/04/2013  FINDINGS: There are spinal needles marking a L5 and L3 levels.  IMPRESSION: L3 and L5 marked intraoperatively.   Electronically Signed   By: Kalman Jewels M.D.   On: 01-24-2014 15:16   Dg Spine Portable 1 View  01/24/14   CLINICAL DATA:  Intraoperative study for decompression.  EXAM: PORTABLE SPINE - 1 VIEW  COMPARISON:  CT myelogram 09/04/2013.  FINDINGS: Single lateral view labeled 1314 hr. A surgical device projects posterior to the upper portion of the L5 vertebral body.  IMPRESSION: Intraoperative localization of the L5 vertebral body.   Electronically Signed   By: Abigail Miyamoto M.D.   On: Jan 24, 2014 13:44        Discharge Instructions   Call MD / Call 911    Complete by:  As directed   If you experience chest pain or shortness of breath, CALL 911 and be transported to the hospital emergency room.  If you develope a fever above 101 F, pus (white drainage) or  increased drainage or redness at the wound, or calf pain, call your surgeon's office.     Constipation Prevention    Complete by:  As directed   Drink plenty of fluids.  Prune juice may  be helpful.  You may use a stool softener, such as Colace (over the counter) 100 mg twice a day.  Use MiraLax (over the counter) for constipation as needed.     Diet - low sodium heart healthy    Complete by:  As directed      Discharge instructions    Complete by:  As directed   Ok to shower 5 days postop.  Do not apply any creams or ointments to incision.  Do not remove steri-strips.  Can use 4x4 gauze and tape for dressing changes.  No aggressive activity.  No bending, squatting or prolonged sitting.  Mostly be in reclined position or lying down. Must wear brace when up and ambulating.     Driving restrictions    Complete by:  As directed   No driving until further notice.     Increase activity slowly as tolerated    Complete by:  As directed      Lifting restrictions    Complete by:  As directed   No lifting until further notice.No lifting until further notice.             Discharge Plan:  discharge to home vs snf  Disposition:     Signed: Lanae Crumbly for Dr. Melina Schools [facility] [phone] 01/11/2014, 8:27 AM

## 2014-01-11 NOTE — Progress Notes (Signed)
    Subjective: Procedure(s) (LRB): L4-S1 Decompression with removal and reimplantation of spinal cord stimulator battery  (N/A) 3 Days Post-Op  Patient reports pain as 4 on 0-10 scale.  Reports deferred at this time leg pain reports incisional back pain   Positive void Positive bowel movement Positive flatus Negative chest pain or shortness of breath  Objective: Vital signs in last 24 hours: Temp:  [97.9 F (36.6 C)-98.5 F (36.9 C)] 98.1 F (36.7 C) (10/03 0535) Pulse Rate:  [91-102] 102 (10/03 0535) Resp:  [14-16] 16 (10/03 0535) BP: (100-121)/(57-65) 115/57 mmHg (10/03 0535) SpO2:  [90 %-93 %] 90 % (10/03 0535)  Intake/Output from previous day: 10/02 0701 - 10/03 0700 In: 720 [P.O.:720] Out: 800 [Urine:800]  Labs: No results found for this basename: WBC, RBC, HCT, PLT,  in the last 72 hours  Recent Labs  01/09/14 0645  NA 138  K 4.6  CL 98  CO2 26  BUN 10  CREATININE 0.56  GLUCOSE 169*  CALCIUM 9.5   No results found for this basename: LABPT, INR,  in the last 72 hours  Physical Exam: Neurologically intact ABD soft Neurovascular intact Incision: dressing C/D/I Compartment soft  Assessment/Plan: Patient stable  xrays n/a Continue mobilization with physical therapy Continue care  Advance diet Up with therapy Discharge to SNF vs home depending upon ability to mobilize  Melina Schools, MD [place] [phone]

## 2014-01-12 ENCOUNTER — Encounter (HOSPITAL_COMMUNITY): Payer: Self-pay

## 2014-01-12 DIAGNOSIS — M4806 Spinal stenosis, lumbar region: Secondary | ICD-10-CM | POA: Diagnosis not present

## 2014-01-12 LAB — GLUCOSE, CAPILLARY
GLUCOSE-CAPILLARY: 141 mg/dL — AB (ref 70–99)
GLUCOSE-CAPILLARY: 142 mg/dL — AB (ref 70–99)
Glucose-Capillary: 157 mg/dL — ABNORMAL HIGH (ref 70–99)

## 2014-01-12 NOTE — Progress Notes (Signed)
Pt has been offered bed by [facility] and he has accepted.  Pt/wife agreeable to tx today via PTAR.  [facility] agreeable to accept pt today and wife to complete paperwork at 4:30 pm.  PTAR to be arranged.  RN informed.

## 2014-01-12 NOTE — Progress Notes (Signed)
Utilization Review Completed.   Marrell Dicaprio, RN, BSN Nurse Case Manager  

## 2014-01-12 NOTE — Progress Notes (Addendum)
Subjective: 4 Days Post-Op Procedure(s) (LRB): L4-S1 Decompression with removal and reimplantation of spinal cord stimulator battery  (N/A) Patient reports pain as moderate.  C/o incisional back pain. Leg pain resolved but legs feeling like "they don't want to work" c/o weakness, fatigue. PT has recommend SNF and he and his wife are in agreement to that. Voiding without difficulty. + BM   Objective: Vital signs in last 24 hours: Temp:  [97.7 F (36.5 C)-98.5 F (36.9 C)] 98.1 F (36.7 C) (10/04 0625) Pulse Rate:  [94-102] 94 (10/04 0625) Resp:  [16] 16 (10/04 0625) BP: (102-145)/(58-73) 129/73 mmHg (10/04 0625) SpO2:  [90 %-91 %] 91 % (10/04 0625)  Intake/Output from previous day: 10/03 0701 - 10/04 0700 In: 120 [P.O.:120] Out: 775 [Urine:775] Intake/Output this shift:    No results found for this basename: HGB,  in the last 72 hours No results found for this basename: WBC, RBC, HCT, PLT,  in the last 72 hours No results found for this basename: NA, K, CL, CO2, BUN, CREATININE, GLUCOSE, CALCIUM,  in the last 72 hours No results found for this basename: LABPT, INR,  in the last 72 hours  Neurologically intact ABD soft Neurovascular intact Sensation intact distally Intact pulses distally Dorsiflexion/Plantar flexion intact Incision: dressing C/D/I and no drainage No cellulitis present Compartment soft no calf pain or sign of DVT  Assessment/Plan: 4 Days Post-Op Procedure(s) (LRB): L4-S1 Decompression with removal and reimplantation of spinal cord stimulator battery  (N/A) Advance diet Up with therapy D/C IV fluids Continue PT Awaiting SNF placement- social work consult placed Likely D/C to SNF tomorrow  Lacie Draft M. 01/12/2014, 8:07 AM

## 2014-01-14 ENCOUNTER — Ambulatory Visit: Payer: Self-pay | Admitting: Internal Medicine

## 2014-01-22 ENCOUNTER — Encounter: Payer: Self-pay | Admitting: Internal Medicine

## 2014-01-29 ENCOUNTER — Ambulatory Visit: Payer: Self-pay | Admitting: Internal Medicine

## 2014-02-13 ENCOUNTER — Encounter: Payer: Self-pay | Admitting: Internal Medicine

## 2014-02-13 ENCOUNTER — Ambulatory Visit (INDEPENDENT_AMBULATORY_CARE_PROVIDER_SITE_OTHER): Payer: Medicare PPO | Admitting: Internal Medicine

## 2014-02-13 ENCOUNTER — Other Ambulatory Visit: Payer: Self-pay | Admitting: Internal Medicine

## 2014-02-13 VITALS — BP 126/80 | HR 104 | Temp 98.4°F | Resp 16 | Wt 214.6 lb

## 2014-02-13 DIAGNOSIS — E039 Hypothyroidism, unspecified: Secondary | ICD-10-CM

## 2014-02-13 LAB — TSH: TSH: 2.57 u[IU]/mL (ref 0.350–4.500)

## 2014-02-13 NOTE — Progress Notes (Signed)
   Subjective:    Patient ID: James Parsons, male    DOB: [birth year], 68 y.o.   MRN: [id number]  HPI  Patient returns today to recheck abnormally elevated TSH. Patient recently had another back surgery and is recovering reasonably well and is ambulating - albeit slowly . Systems review otherwise negative.  Meds/All/PMH reviewed & unchanged   Review of Systems  Objective:   Physical Exam   BP 126/80 mmHg  Pulse 104  Temp 98.4 F   Resp 16  Wt 214 lb 9.6 oz   No formal exam with patient appearing in no acute distress.  Assessment & Plan:   1. Hypothyroidism  - Check TSH for confirmation

## 2014-02-13 NOTE — Addendum Note (Signed)
Addended by: Unk Pinto on: 02/13/2014 06:23 PM   Modules accepted: Orders

## 2014-03-09 ENCOUNTER — Inpatient Hospital Stay (HOSPITAL_COMMUNITY): Payer: Self-pay | Admitting: Neurological Surgery

## 2014-03-09 DIAGNOSIS — S12601A Unspecified nondisplaced fracture of seventh cervical vertebra, initial encounter for closed fracture: Secondary | ICD-10-CM

## 2014-03-09 DIAGNOSIS — S066X9A Traumatic subarachnoid hemorrhage with loss of consciousness of unspecified duration, initial encounter: Secondary | ICD-10-CM

## 2014-03-09 DIAGNOSIS — S12201A Unspecified nondisplaced fracture of third cervical vertebra, initial encounter for closed fracture: Secondary | ICD-10-CM

## 2014-03-30 ENCOUNTER — Encounter (HOSPITAL_COMMUNITY): Payer: Self-pay | Admitting: Emergency Medicine

## 2014-03-30 ENCOUNTER — Inpatient Hospital Stay (HOSPITAL_COMMUNITY)
Admission: EM | Admit: 2014-03-30 | Discharge: 2014-04-02 | DRG: 872 | Disposition: A | Discharge status: Home / Self Care | Payer: Medicare PPO | Attending: Family Medicine | Admitting: Family Medicine

## 2014-03-30 DIAGNOSIS — Z86010 Personal history of colonic polyps: Secondary | ICD-10-CM

## 2014-03-30 DIAGNOSIS — Z9689 Presence of other specified functional implants: Secondary | ICD-10-CM

## 2014-03-30 DIAGNOSIS — B9620 Unspecified Escherichia coli [E. coli] as the cause of diseases classified elsewhere: Secondary | ICD-10-CM | POA: Diagnosis present

## 2014-03-30 DIAGNOSIS — N400 Enlarged prostate without lower urinary tract symptoms: Secondary | ICD-10-CM | POA: Diagnosis present

## 2014-03-30 DIAGNOSIS — J449 Chronic obstructive pulmonary disease, unspecified: Secondary | ICD-10-CM | POA: Diagnosis present

## 2014-03-30 DIAGNOSIS — N390 Urinary tract infection, site not specified: Secondary | ICD-10-CM | POA: Diagnosis present

## 2014-03-30 DIAGNOSIS — M549 Dorsalgia, unspecified: Secondary | ICD-10-CM

## 2014-03-30 DIAGNOSIS — E119 Type 2 diabetes mellitus without complications: Secondary | ICD-10-CM | POA: Diagnosis present

## 2014-03-30 DIAGNOSIS — Y92009 Unspecified place in unspecified non-institutional (private) residence as the place of occurrence of the external cause: Secondary | ICD-10-CM

## 2014-03-30 DIAGNOSIS — M25569 Pain in unspecified knee: Secondary | ICD-10-CM

## 2014-03-30 DIAGNOSIS — D649 Anemia, unspecified: Secondary | ICD-10-CM | POA: Diagnosis present

## 2014-03-30 DIAGNOSIS — Z9889 Other specified postprocedural states: Secondary | ICD-10-CM

## 2014-03-30 DIAGNOSIS — E785 Hyperlipidemia, unspecified: Secondary | ICD-10-CM | POA: Diagnosis present

## 2014-03-30 DIAGNOSIS — M25531 Pain in right wrist: Secondary | ICD-10-CM

## 2014-03-30 DIAGNOSIS — R531 Weakness: Secondary | ICD-10-CM | POA: Diagnosis not present

## 2014-03-30 DIAGNOSIS — Z87891 Personal history of nicotine dependence: Secondary | ICD-10-CM

## 2014-03-30 DIAGNOSIS — A419 Sepsis, unspecified organism: Secondary | ICD-10-CM | POA: Diagnosis not present

## 2014-03-30 DIAGNOSIS — M1990 Unspecified osteoarthritis, unspecified site: Secondary | ICD-10-CM | POA: Diagnosis present

## 2014-03-30 DIAGNOSIS — F329 Major depressive disorder, single episode, unspecified: Secondary | ICD-10-CM | POA: Diagnosis present

## 2014-03-30 DIAGNOSIS — E669 Obesity, unspecified: Secondary | ICD-10-CM | POA: Diagnosis present

## 2014-03-30 DIAGNOSIS — W19XXXA Unspecified fall, initial encounter: Secondary | ICD-10-CM

## 2014-03-30 DIAGNOSIS — Z885 Allergy status to narcotic agent status: Secondary | ICD-10-CM

## 2014-03-30 DIAGNOSIS — Z794 Long term (current) use of insulin: Secondary | ICD-10-CM

## 2014-03-30 DIAGNOSIS — G894 Chronic pain syndrome: Secondary | ICD-10-CM | POA: Diagnosis present

## 2014-03-30 DIAGNOSIS — I872 Venous insufficiency (chronic) (peripheral): Secondary | ICD-10-CM | POA: Diagnosis present

## 2014-03-30 DIAGNOSIS — I10 Essential (primary) hypertension: Secondary | ICD-10-CM | POA: Diagnosis present

## 2014-03-30 DIAGNOSIS — Z6832 Body mass index (BMI) 32.0-32.9, adult: Secondary | ICD-10-CM

## 2014-03-30 LAB — COMPREHENSIVE METABOLIC PANEL
ALK PHOS: 132 U/L — AB (ref 39–117)
ALT: 29 U/L (ref 0–53)
AST: 20 U/L (ref 0–37)
Albumin: 3.3 g/dL — ABNORMAL LOW (ref 3.5–5.2)
Anion gap: 17 — ABNORMAL HIGH (ref 5–15)
BUN: 15 mg/dL (ref 6–23)
CO2: 22 meq/L (ref 19–32)
Calcium: 10.5 mg/dL (ref 8.4–10.5)
Chloride: 91 mEq/L — ABNORMAL LOW (ref 96–112)
Creatinine, Ser: 0.59 mg/dL (ref 0.50–1.35)
Glucose, Bld: 379 mg/dL — ABNORMAL HIGH (ref 70–99)
POTASSIUM: 4.3 meq/L (ref 3.7–5.3)
SODIUM: 130 meq/L — AB (ref 137–147)
Total Bilirubin: 0.7 mg/dL (ref 0.3–1.2)
Total Protein: 8.1 g/dL (ref 6.0–8.3)

## 2014-03-30 LAB — CBC
HCT: 38.3 % — ABNORMAL LOW (ref 39.0–52.0)
Hemoglobin: 13 g/dL (ref 13.0–17.0)
MCH: 28.1 pg (ref 26.0–34.0)
MCHC: 33.9 g/dL (ref 30.0–36.0)
MCV: 82.7 fL (ref 78.0–100.0)
Platelets: 236 10*3/uL (ref 150–400)
RBC: 4.63 MIL/uL (ref 4.22–5.81)
RDW: 15.2 % (ref 11.5–15.5)
WBC: 12.7 10*3/uL — AB (ref 4.0–10.5)

## 2014-03-30 LAB — URINALYSIS, ROUTINE W REFLEX MICROSCOPIC
BILIRUBIN URINE: NEGATIVE
Glucose, UA: >1000 mg/dL — AB
KETONES UR: NEGATIVE mg/dL
NITRITE: NEGATIVE
Protein, ur: 30 mg/dL — AB
SPECIFIC GRAVITY, URINE: 1.033 — AB (ref 1.005–1.030)
UROBILINOGEN UA: 1 mg/dL (ref 0.0–1.0)
pH: 6 (ref 5.0–8.0)

## 2014-03-30 LAB — CBG MONITORING, ED: Glucose-Capillary: 338 mg/dL — ABNORMAL HIGH (ref 70–99)

## 2014-03-30 LAB — URINE MICROSCOPIC-ADD ON

## 2014-03-30 MED ORDER — DEXTROSE 5 % IV SOLN
1.0000 g | Freq: Two times a day (BID) | INTRAVENOUS | Status: DC
  Administered 2014-03-31: 1 g via INTRAVENOUS
  Filled 2014-03-30 (×3): qty 1

## 2014-03-30 MED ORDER — SODIUM CHLORIDE 0.9 % IV SOLN
Freq: Once | INTRAVENOUS | Status: AC
  Administered 2014-03-30: via INTRAVENOUS

## 2014-03-30 NOTE — ED Notes (Signed)
Pt is from home, pt felt dizzy tonight after using the bathroom, pt reports he fell down from his dizziness, fire department had to help the pt up from the floor. Pt denies LOC and remembers the event. Pt reports left hamstring pain as a result from the fall and right thumb pain (that started yesterday). Pt has a fentanyl pump in his RLQ r/t a back injury 20 years ago. Pt CBG 404 per EMS. Pt ST (130s). A&O X4.

## 2014-03-30 NOTE — ED Notes (Signed)
Duplicate order discontinued.  

## 2014-03-30 NOTE — ED Provider Notes (Signed)
CSN: [id number]     Arrival date & time 03/30/14  2235 History  This chart was scribed for James Biles, MD by Rayfield Citizen, ED Scribe. This patient was seen in room B15C/B15C and the patient's care was started at 11:13 PM.    Chief Complaint  Patient presents with  . Near Syncope  . Fall  . Urinary Frequency   Patient is a 68 y.o. male presenting with near-syncope, fall, and frequency. The history is provided by the patient. No language interpreter was used.  Near Syncope Pertinent negatives include no chest pain, no headaches and no shortness of breath.  Fall Pertinent negatives include no chest pain, no headaches and no shortness of breath.  Urinary Frequency Pertinent negatives include no chest pain, no headaches and no shortness of breath.     HPI Comments: James Parsons is a 68 y.o. male who presents to the Emergency Department complaining of fall. He denies head injury or LOC with this fall. He also reports three days of weakness: he explains that his "legs gave out on him" when returning to bed tonight. He reports that he has been compliant with his medications. He denies fevers, nausea, vomiting, diarrhea, abdominal pain, chest pain, SOB, cough, headache. He denies history of heart problems, stroke history, personal history of cancer.   ROS is + for two days of neck pain and stiffness. He has increased pain with rotation, turning to the left worse than turning to the right.   Wife explains that patient has recently had pain in the left knee; he has difficulty ambulating due to this pain. Patient has a prior surgical history to this knee; also had back surgery on 01/09/14.   Three days PTA, she also reports constant stabbing pain to his right wrist; he denies trauma or injury. His pain is worse with movement. He denies history of carpal tunnel.   PCP is Alesia Richards, MD. Patient has not discussed these issues with his PCP. He did get a flu shot this year.   Past  Medical History  Diagnosis Date  . Anemia   . Arthritis   . Blood transfusion   . Clotting disorder   . Diabetes mellitus   . Borderline hypertension   . Venous insufficiency   . Hyperlipidemia   . Obesity   . Diverticulosis of colon   . Hx of colonic polyps   . Low back pain   . Lipoma   . RSD (reflex sympathetic dystrophy)   . Mental disorder   . Depression     hx of   . COPD (chronic obstructive pulmonary disease)     denies  . Pneumonia     hx   Past Surgical History  Procedure Laterality Date  . Back surgery  5188,4166  . Morphine pump  2009    due to reaction to morphine/changed to fentanyl  . Excision of lipoma from right olecranon area  11/2000    Dr. Rise [name]  . Microdiscectomy and decompression  05/2002    Dr. Tonita Cong  . C3-4 anterior cervical discectomy and fusion with plating at c3-4  05/2006    Dr. Arnoldo Morale  . Spinal cord stimulator implanted      for pain per Dr. Maryruth Eve  . Subcut pain pump implanted    . Pain pump implantation      with fentanyl  . Tonsillectomy    . Colonoscopy w/ polypectomy    . Knee arthroscopy      right knee  .  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 Total knee arthroplasty  02/29/2012    Procedure: TOTAL KNEE ARTHROPLASTY;  Surgeon: Kerin Salen, MD;  Location: [facility];  Service: Orthopedics;  Laterality: Right;  . Lumbar laminectomy/decompression microdiscectomy N/A 01/08/2014    Procedure: L4-S1 Decompression with removal and reimplantation of spinal cord stimulator battery ;  Surgeon: Melina Schools, MD;  Location: [facility];  Service: Orthopedics;  Laterality: N/A;   Family History  Problem Relation Age of Onset  . Cancer Father   . Colon cancer Neg Hx   . Esophageal cancer Neg Hx   . Stomach cancer Neg Hx   . Rectal cancer Neg Hx    History  Substance Use Topics  . Smoking status: Former Smoker -- 3.00 packs/day for 40 years    Types: Cigarettes    Quit date: 05/25/2002  . Smokeless tobacco: Never Used  .  Alcohol Use: No    Review of Systems  Constitutional: Positive for activity change and fatigue. Negative for fever and chills.  Eyes: Negative for visual disturbance.  Respiratory: Negative for cough, chest tightness and shortness of breath.   Cardiovascular: Positive for near-syncope. Negative for chest pain.  Gastrointestinal: Negative for abdominal distention.  Genitourinary: Positive for frequency. Negative for dysuria, enuresis and difficulty urinating.  Musculoskeletal: Positive for myalgias, arthralgias, neck pain and neck stiffness.  Neurological: Positive for dizziness, weakness and light-headedness. Negative for headaches.  Psychiatric/Behavioral: Negative for confusion.      Allergies  Codeine and Morphine and related  Home Medications   Prior to Admission medications   Medication Sig Start Date End Date Taking? Authorizing Provider  celecoxib (CELEBREX) 200 MG capsule Take 200 mg by mouth 2 (two) times daily. 01/20/14  Yes Historical Provider, MD  Cholecalciferol (VITAMIN D-3) 5000 UNITS TABS Take 5,000 Units by mouth 2 (two) times daily.   Yes Historical Provider, MD  cloNIDine (CATAPRES) 0.1 MG tablet Take 0.1 mg by mouth 2 (two) times daily.   Yes Historical Provider, MD  cyclobenzaprine (FLEXERIL) 10 MG tablet Take 10 mg by mouth 3 (three) times daily as needed for muscle spasms. Muscle spasm. 03/02/11  Yes Historical Provider, MD  DULoxetine (CYMBALTA) 60 MG capsule Take 60 mg by mouth daily.  10/09/12  Yes Historical Provider, MD  ENDOCET 10-325 MG per tablet Take 1 tablet by mouth 3 (three) times daily as needed. pain 03/08/11  Yes Historical Provider, MD  fentaNYL 0.05 MG/ML SOLN 5,000 mcg Inject into the skin continuous. Patient will bring dosage instructions with him. Daily dose 0.84mg    Yes Historical Provider, MD  glimepiride (AMARYL) 4 MG tablet Take 4 mg by mouth daily with breakfast.  01/12/14  Yes Historical Provider, MD  lidocaine (LIDODERM) 5 % Place 1 patch  onto the skin daily.  01/12/14  Yes Historical Provider, MD  LYRICA 150 MG capsule Take 150 mg by mouth 2 (two) times daily.  02/21/11  Yes Historical Provider, MD  metFORMIN (GLUCOPHAGE) 500 MG tablet Take 1,000 mg by mouth 2 (two) times daily with a meal.   Yes Historical Provider, MD  simvastatin (ZOCOR) 40 MG tablet Take 40 mg by mouth daily.   Yes Historical Provider, MD  sitaGLIPtin (JANUVIA) 100 MG tablet Take 50 mg by mouth daily. 11/21/13  Yes Unk Pinto, MD  tamsulosin (FLOMAX) 0.4 MG CAPS capsule Take 0.4 mg by mouth daily.  05/07/13  Yes Historical Provider, MD  traMADol (ULTRAM) 50 MG tablet Take 50 mg  by mouth 3 (three) times daily as needed. pain 02/21/11  Yes Historical Provider, MD   BP 113/57 mmHg  Pulse 108  Temp(Src) 98.7 F (37.1 C) (Oral)  Resp 20  Ht 5\' 9"  (1.753 m)  Wt 221 lb (100.245 kg)  BMI 32.62 kg/m2  SpO2 95% Physical Exam  Constitutional: He is oriented to person, place, and time. He appears well-developed and well-nourished.  HENT:  Head: Normocephalic and atraumatic.  Mouth/Throat: Oropharynx is clear and moist. No oropharyngeal exudate.  Eyes: Conjunctivae and EOM are normal. Pupils are equal, round, and reactive to light.  Pupils 31mm and equal; EOMI with no nystagmus  Neck: No tracheal deviation present.  No nuchal rigidity  Cardiovascular: Regular rhythm.  Exam reveals no gallop and no friction rub.   Murmur heard. Tachy with systolic murmur  Pulmonary/Chest: Effort normal and breath sounds normal. No respiratory distress. He has no wheezes. He has no rales.  Abdominal: Soft. There is no tenderness. There is no rebound and no guarding.  Musculoskeletal: Normal range of motion. He exhibits no edema.  Left sided paraspinal TTP; turning head to left side is limited secondary to pain, able to turn right side without any pain. Tenderness over wrist on dorsal aspect; tenderness over anatomical snuffbox; right wrist appears mildly swollen with reduced  ROM secondary to pain; no erythema or calor  Surgical site has healed well.  Neurological: He is alert and oriented to person, place, and time. No cranial nerve deficit.  Cranial nerve 2-12 intact.  Sensory exam for both upper and lower extremities is normal; 2+ patellar reflex in LLE; 1+ in RLE  Skin: Skin is warm and dry. No rash noted.  Psychiatric: He has a normal mood and affect. His behavior is normal.  Nursing note and vitals reviewed.   ED Course  Procedures   DIAGNOSTIC STUDIES: Oxygen Saturation is 96% on RA, adequate by my interpretation.    COORDINATION OF CARE: 11:20 PM Discussed treatment plan with pt at bedside and pt agreed to plan.   Labs Review Labs Reviewed  URINALYSIS, ROUTINE W REFLEX MICROSCOPIC - Abnormal; Notable for the following:    APPearance TURBID (*)    Specific Gravity, Urine 1.033 (*)    Glucose, UA >1000 (*)    Hgb urine dipstick SMALL (*)    Protein, ur 30 (*)    Leukocytes, UA MODERATE (*)    All other components within normal limits  CBC - Abnormal; Notable for the following:    WBC 12.7 (*)    HCT 38.3 (*)    All other components within normal limits  COMPREHENSIVE METABOLIC PANEL - Abnormal; Notable for the following:    Sodium 130 (*)    Chloride 91 (*)    Glucose, Bld 379 (*)    Albumin 3.3 (*)    Alkaline Phosphatase 132 (*)    Anion gap 17 (*)    All other components within normal limits  URINE MICROSCOPIC-ADD ON - Abnormal; Notable for the following:    Bacteria, UA MANY (*)    All other components within normal limits  CBG MONITORING, ED - Abnormal; Notable for the following:    Glucose-Capillary 338 (*)    All other components within normal limits  I-STAT CG4 LACTIC ACID, ED - Abnormal; Notable for the following:    Lactic Acid, Venous 2.42 (*)    All other components within normal limits  CBG MONITORING, ED - Abnormal; Notable for the following:    Glucose-Capillary 266 (*)  All other components within normal limits   URINE CULTURE  CULTURE, BLOOD (ROUTINE X 2)  CULTURE, BLOOD (ROUTINE X 2)  TROPONIN I    Imaging Review Dg Chest 2 View  03/31/2014   CLINICAL DATA:  Generalized weakness  EXAM: CHEST  2 VIEW  COMPARISON:  10/16/2013  FINDINGS: The heart size at upper limits of normal. Spinal stimulator noted. Both lungs are clear. The visualized skeletal structures are unremarkable but subjectively osteopenic.  IMPRESSION: No active cardiopulmonary disease.   Electronically Signed   By: Conchita Paris M.D.   On: 03/31/2014 00:45   Dg Wrist Complete Right  03/31/2014   CLINICAL DATA:  Acute right lateral wrist pain and swelling since last night without trauma  EXAM: RIGHT WRIST - COMPLETE 3+ VIEW  COMPARISON:  None.  FINDINGS: There is no evidence of fracture or dislocation. There is no evidence of arthropathy or other focal bone abnormality. Soft tissues are unremarkable. Scapholunate distance at upper limits of normal measuring 3 mm. Degenerative change noted at the first carpometacarpal joint.  IMPRESSION: No acute osseous abnormality.  Scapholunate distance of 3 mm at upper limits of normal. Correlate for point tenderness to determine chronicity.   Electronically Signed   By: Conchita Paris M.D.   On: 03/31/2014 00:44   Ct Head Wo Contrast  03/31/2014   CLINICAL DATA:  Dizziness, fall  EXAM: CT HEAD WITHOUT CONTRAST  TECHNIQUE: Contiguous axial images were obtained from the base of the skull through the vertex without intravenous contrast.  COMPARISON:  10/16/2013  FINDINGS: Mild cortical volume loss noted with proportional ventricular prominence. Areas of periventricular white matter hypodensity are most compatible with small vessel ischemic change. No acute hemorrhage, infarct, or mass lesion is identified. No midline shift. No acute osseous abnormality. Orbits and paranasal sinuses are unremarkable.  IMPRESSION: No acute intracranial finding.   Electronically Signed   By: Conchita Paris M.D.   On:  03/31/2014 00:57     EKG Interpretation   Date/Time:  Sunday March 30 2014 22:45:10 EST Ventricular Rate:  132 PR Interval:  71 QRS Duration: 96 QT Interval:  366 QTC Calculation: 542 R Axis:   79 Text Interpretation:  Sinus tachycardia s1q3t3 pattern seen New t wave  inversion compared to last in lead III Confirmed by Kathrynn Humble, MD, Thelma Comp  [phone]) on 03/31/2014 12:05:20 AM      MDM   Final diagnoses:  Wrist pain, acute, right  Weakness generalized  Sepsis secondary to UTI  Fall at home    I personally performed the services described in this documentation, which was scribed in my presence. The recorded information has been reviewed and is accurate.  Pt comes in with cc of weakness and a fall. He is diabetic, also had a recent spine surgery.  Pt is noted to have weakness, and is tachycardic. He on ROS volunteered some neck pain and stiffness, however, there is no true nuchal rigidity, and he has no neuro deficits or complains or even headaches. Bacterial meningitis is unlikely, aseptic meningitis possible, but even that is low on the pretest probability.  UA is + for infection. And it likely is the source of his gradual weakness and decline.  Not sure hat what is causing his wrist pain. He has scaphoid tenderness, there is mild swelling, no signs of joint infection however. Xrays ordered.  Pt is tachycardic, has s1q3t3. No chest pain, dib, hypoxia. With a source of infection, will attribute his tachycardia to infection for now, and  not jump to get CT PE (not a good dimer candidate, as he is moderate risk for PE, and also had a fall).  Finally, there is a mild gap of 17 and hypergylcemia. Hydration started.  3:02 AM Dr. Posey Pronto informed that PE should be on the ddx. If pt gets worse and that a hand appointment will be needed/ or follow up given for the wrist pain. GU exam shows erythematous sctorum. Pt has no pain. He has no abd tenderness. No concerns for necrotizing  inf.  James Biles, MD 03/31/14 [phone]

## 2014-03-30 NOTE — ED Notes (Signed)
CBG=338 

## 2014-03-31 ENCOUNTER — Inpatient Hospital Stay (HOSPITAL_COMMUNITY): Payer: Medicare PPO

## 2014-03-31 ENCOUNTER — Emergency Department (HOSPITAL_COMMUNITY): Payer: Medicare PPO

## 2014-03-31 ENCOUNTER — Encounter (HOSPITAL_COMMUNITY): Payer: Self-pay | Admitting: *Deleted

## 2014-03-31 DIAGNOSIS — D649 Anemia, unspecified: Secondary | ICD-10-CM | POA: Diagnosis present

## 2014-03-31 DIAGNOSIS — M25562 Pain in left knee: Secondary | ICD-10-CM

## 2014-03-31 DIAGNOSIS — N390 Urinary tract infection, site not specified: Secondary | ICD-10-CM | POA: Diagnosis present

## 2014-03-31 DIAGNOSIS — Z86010 Personal history of colonic polyps: Secondary | ICD-10-CM | POA: Diagnosis not present

## 2014-03-31 DIAGNOSIS — M1990 Unspecified osteoarthritis, unspecified site: Secondary | ICD-10-CM | POA: Diagnosis present

## 2014-03-31 DIAGNOSIS — Z885 Allergy status to narcotic agent status: Secondary | ICD-10-CM | POA: Diagnosis not present

## 2014-03-31 DIAGNOSIS — Z6832 Body mass index (BMI) 32.0-32.9, adult: Secondary | ICD-10-CM | POA: Diagnosis not present

## 2014-03-31 DIAGNOSIS — I872 Venous insufficiency (chronic) (peripheral): Secondary | ICD-10-CM | POA: Diagnosis present

## 2014-03-31 DIAGNOSIS — B9620 Unspecified Escherichia coli [E. coli] as the cause of diseases classified elsewhere: Secondary | ICD-10-CM | POA: Diagnosis present

## 2014-03-31 DIAGNOSIS — E119 Type 2 diabetes mellitus without complications: Secondary | ICD-10-CM | POA: Diagnosis present

## 2014-03-31 DIAGNOSIS — F329 Major depressive disorder, single episode, unspecified: Secondary | ICD-10-CM | POA: Diagnosis present

## 2014-03-31 DIAGNOSIS — M79641 Pain in right hand: Secondary | ICD-10-CM

## 2014-03-31 DIAGNOSIS — J449 Chronic obstructive pulmonary disease, unspecified: Secondary | ICD-10-CM | POA: Diagnosis present

## 2014-03-31 DIAGNOSIS — R531 Weakness: Secondary | ICD-10-CM | POA: Diagnosis present

## 2014-03-31 DIAGNOSIS — A419 Sepsis, unspecified organism: Principal | ICD-10-CM

## 2014-03-31 DIAGNOSIS — I10 Essential (primary) hypertension: Secondary | ICD-10-CM | POA: Diagnosis present

## 2014-03-31 DIAGNOSIS — G894 Chronic pain syndrome: Secondary | ICD-10-CM

## 2014-03-31 DIAGNOSIS — N400 Enlarged prostate without lower urinary tract symptoms: Secondary | ICD-10-CM | POA: Diagnosis present

## 2014-03-31 DIAGNOSIS — M79609 Pain in unspecified limb: Secondary | ICD-10-CM

## 2014-03-31 DIAGNOSIS — E669 Obesity, unspecified: Secondary | ICD-10-CM | POA: Diagnosis present

## 2014-03-31 DIAGNOSIS — Z87891 Personal history of nicotine dependence: Secondary | ICD-10-CM | POA: Diagnosis not present

## 2014-03-31 DIAGNOSIS — Z9889 Other specified postprocedural states: Secondary | ICD-10-CM

## 2014-03-31 DIAGNOSIS — Z9689 Presence of other specified functional implants: Secondary | ICD-10-CM

## 2014-03-31 DIAGNOSIS — Z794 Long term (current) use of insulin: Secondary | ICD-10-CM | POA: Diagnosis not present

## 2014-03-31 DIAGNOSIS — E785 Hyperlipidemia, unspecified: Secondary | ICD-10-CM | POA: Diagnosis present

## 2014-03-31 LAB — GLUCOSE, CAPILLARY
Glucose-Capillary: 218 mg/dL — ABNORMAL HIGH (ref 70–99)
Glucose-Capillary: 247 mg/dL — ABNORMAL HIGH (ref 70–99)
Glucose-Capillary: 270 mg/dL — ABNORMAL HIGH (ref 70–99)
Glucose-Capillary: 330 mg/dL — ABNORMAL HIGH (ref 70–99)

## 2014-03-31 LAB — COMPREHENSIVE METABOLIC PANEL
ALT: 25 U/L (ref 0–53)
ANION GAP: 13 (ref 5–15)
AST: 23 U/L (ref 0–37)
Albumin: 2.9 g/dL — ABNORMAL LOW (ref 3.5–5.2)
Alkaline Phosphatase: 117 U/L (ref 39–117)
BUN: 14 mg/dL (ref 6–23)
CALCIUM: 9.7 mg/dL (ref 8.4–10.5)
CO2: 26 mEq/L (ref 19–32)
CREATININE: 0.62 mg/dL (ref 0.50–1.35)
Chloride: 96 mEq/L (ref 96–112)
GFR calc non Af Amer: >90 mL/min (ref >90)
GLUCOSE: 277 mg/dL — AB (ref 70–99)
Potassium: 4.4 mEq/L (ref 3.7–5.3)
SODIUM: 135 meq/L — AB (ref 137–147)
TOTAL PROTEIN: 7.3 g/dL (ref 6.0–8.3)
Total Bilirubin: 0.8 mg/dL (ref 0.3–1.2)

## 2014-03-31 LAB — CBG MONITORING, ED: Glucose-Capillary: 266 mg/dL — ABNORMAL HIGH (ref 70–99)

## 2014-03-31 LAB — CBC
HCT: 35.7 % — ABNORMAL LOW (ref 39.0–52.0)
HEMOGLOBIN: 11.7 g/dL — AB (ref 13.0–17.0)
MCH: 27.2 pg (ref 26.0–34.0)
MCHC: 32.8 g/dL (ref 30.0–36.0)
MCV: 83 fL (ref 78.0–100.0)
Platelets: 207 10*3/uL (ref 150–400)
RBC: 4.3 MIL/uL (ref 4.22–5.81)
RDW: 15.1 % (ref 11.5–15.5)
WBC: 9.9 10*3/uL (ref 4.0–10.5)

## 2014-03-31 LAB — URIC ACID: Uric Acid, Serum: 2.2 mg/dL — ABNORMAL LOW (ref 4.0–7.8)

## 2014-03-31 LAB — HEMOGLOBIN A1C
Hgb A1c MFr Bld: 8.6 % — ABNORMAL HIGH (ref <5.7)
Mean Plasma Glucose: 200 mg/dL — ABNORMAL HIGH (ref <117)

## 2014-03-31 LAB — PROTIME-INR
INR: 1.24 (ref 0.00–1.49)
PROTHROMBIN TIME: 15.8 s — AB (ref 11.6–15.2)

## 2014-03-31 LAB — TSH: TSH: 0.807 u[IU]/mL (ref 0.350–4.500)

## 2014-03-31 LAB — TROPONIN I

## 2014-03-31 LAB — I-STAT CG4 LACTIC ACID, ED: Lactic Acid, Venous: 2.42 mmol/L — ABNORMAL HIGH (ref 0.5–2.2)

## 2014-03-31 LAB — SEDIMENTATION RATE: SED RATE: 89 mm/h — AB (ref 0–16)

## 2014-03-31 LAB — C-REACTIVE PROTEIN: CRP: 33.5 mg/dL — AB (ref <0.60)

## 2014-03-31 LAB — APTT: APTT: 34 s (ref 24–37)

## 2014-03-31 MED ORDER — NAPROXEN 375 MG PO TABS
375.0000 mg | ORAL_TABLET | Freq: Two times a day (BID) | ORAL | Status: DC
  Administered 2014-03-31 – 2014-04-02 (×6): 375 mg via ORAL
  Filled 2014-03-31 (×7): qty 1

## 2014-03-31 MED ORDER — KETOROLAC TROMETHAMINE 30 MG/ML IJ SOLN
30.0000 mg | Freq: Once | INTRAMUSCULAR | Status: AC
  Administered 2014-03-31: 30 mg via INTRAVENOUS
  Filled 2014-03-31: qty 1

## 2014-03-31 MED ORDER — CYCLOBENZAPRINE HCL 10 MG PO TABS: 10.0000 mg | ORAL_TABLET | Freq: Three times a day (TID) | ORAL | Status: DC | PRN

## 2014-03-31 MED ORDER — TAMSULOSIN HCL 0.4 MG PO CAPS
0.4000 mg | ORAL_CAPSULE | Freq: Every day | ORAL | Status: DC
  Administered 2014-03-31 – 2014-04-02 (×3): 0.4 mg via ORAL
  Filled 2014-03-31 (×3): qty 1

## 2014-03-31 MED ORDER — VANCOMYCIN HCL IN DEXTROSE 1-5 GM/200ML-% IV SOLN
1000.0000 mg | Freq: Once | INTRAVENOUS | Status: AC
  Administered 2014-03-31: 1000 mg via INTRAVENOUS
  Filled 2014-03-31: qty 200

## 2014-03-31 MED ORDER — DULOXETINE HCL 60 MG PO CPEP
60.0000 mg | ORAL_CAPSULE | Freq: Every day | ORAL | Status: DC
  Administered 2014-03-31 – 2014-04-02 (×3): 60 mg via ORAL
  Filled 2014-03-31 (×3): qty 1

## 2014-03-31 MED ORDER — DEXTROSE 5 % IV SOLN
1.0000 g | Freq: Three times a day (TID) | INTRAVENOUS | Status: DC
  Administered 2014-03-31 – 2014-04-02 (×8): 1 g via INTRAVENOUS
  Filled 2014-03-31 (×12): qty 1

## 2014-03-31 MED ORDER — FENTANYL CITRATE 0.05 MG/ML IJ SOLN
50.0000 ug | Freq: Once | INTRAMUSCULAR | Status: AC
  Administered 2014-03-31: 50 ug via INTRAVENOUS
  Filled 2014-03-31: qty 2

## 2014-03-31 MED ORDER — FENTANYL CITRATE 0.05 MG/ML IJ SOLN
50.0000 ug | INTRAMUSCULAR | Status: DC | PRN
  Administered 2014-03-31: 50 ug via INTRAVENOUS
  Filled 2014-03-31: qty 2

## 2014-03-31 MED ORDER — ACETAMINOPHEN 650 MG RE SUPP: 650.0000 mg | Freq: Four times a day (QID) | RECTAL | Status: DC | PRN

## 2014-03-31 MED ORDER — MICONAZOLE NITRATE POWD
Freq: Two times a day (BID) | Status: DC
  Administered 2014-03-31 – 2014-04-02 (×3): via TOPICAL
  Filled 2014-03-31: qty 100

## 2014-03-31 MED ORDER — SODIUM CHLORIDE 0.9 % IJ SOLN
3.0000 mL | Freq: Two times a day (BID) | INTRAMUSCULAR | Status: DC
  Administered 2014-04-02: 3 mL via INTRAVENOUS

## 2014-03-31 MED ORDER — CLONIDINE HCL 0.1 MG PO TABS
0.1000 mg | ORAL_TABLET | Freq: Two times a day (BID) | ORAL | Status: DC
  Administered 2014-04-01 – 2014-04-02 (×3): 0.1 mg via ORAL
  Filled 2014-03-31 (×4): qty 1

## 2014-03-31 MED ORDER — OXYCODONE-ACETAMINOPHEN 10-325 MG PO TABS: 1.0000 | ORAL_TABLET | ORAL | Status: DC | PRN

## 2014-03-31 MED ORDER — OXYCODONE-ACETAMINOPHEN 5-325 MG PO TABS
1.0000 | ORAL_TABLET | ORAL | Status: DC | PRN
  Administered 2014-04-01 – 2014-04-02 (×2): 1 via ORAL
  Filled 2014-03-31 (×2): qty 1

## 2014-03-31 MED ORDER — ONDANSETRON HCL 4 MG/2ML IJ SOLN: 4.0000 mg | Freq: Four times a day (QID) | INTRAMUSCULAR | Status: DC | PRN

## 2014-03-31 MED ORDER — INSULIN ASPART 100 UNIT/ML ~~LOC~~ SOLN
0.0000 [IU] | Freq: Four times a day (QID) | SUBCUTANEOUS | Status: DC
  Administered 2014-03-31: 5 [IU] via SUBCUTANEOUS
  Administered 2014-03-31: 8 [IU] via SUBCUTANEOUS
  Administered 2014-03-31: 11 [IU] via SUBCUTANEOUS
  Administered 2014-04-01: 5 [IU] via SUBCUTANEOUS
  Administered 2014-04-01: 11 [IU] via SUBCUTANEOUS
  Administered 2014-04-01: 8 [IU] via SUBCUTANEOUS
  Administered 2014-04-01 – 2014-04-02 (×4): 5 [IU] via SUBCUTANEOUS

## 2014-03-31 MED ORDER — VANCOMYCIN HCL IN DEXTROSE 1-5 GM/200ML-% IV SOLN
1000.0000 mg | Freq: Three times a day (TID) | INTRAVENOUS | Status: DC
  Administered 2014-03-31 – 2014-04-02 (×6): 1000 mg via INTRAVENOUS
  Filled 2014-03-31 (×9): qty 200

## 2014-03-31 MED ORDER — SODIUM CHLORIDE 0.9 % IV SOLN
INTRAVENOUS | Status: DC
  Administered 2014-03-31 – 2014-04-01 (×3): via INTRAVENOUS

## 2014-03-31 MED ORDER — SIMVASTATIN 40 MG PO TABS
40.0000 mg | ORAL_TABLET | Freq: Every day | ORAL | Status: DC
  Administered 2014-03-31 – 2014-04-02 (×3): 40 mg via ORAL
  Filled 2014-03-31 (×3): qty 1

## 2014-03-31 MED ORDER — ONDANSETRON HCL 4 MG PO TABS: 4.0000 mg | ORAL_TABLET | Freq: Four times a day (QID) | ORAL | Status: DC | PRN

## 2014-03-31 MED ORDER — PREGABALIN 75 MG PO CAPS
150.0000 mg | ORAL_CAPSULE | Freq: Two times a day (BID) | ORAL | Status: DC
  Administered 2014-03-31 – 2014-04-02 (×5): 150 mg via ORAL
  Filled 2014-03-31 (×5): qty 2

## 2014-03-31 MED ORDER — OXYCODONE HCL 5 MG PO TABS
5.0000 mg | ORAL_TABLET | ORAL | Status: DC | PRN
  Administered 2014-04-01 – 2014-04-02 (×2): 5 mg via ORAL
  Filled 2014-03-31 (×2): qty 1

## 2014-03-31 MED ORDER — INSULIN GLARGINE 100 UNIT/ML ~~LOC~~ SOLN
15.0000 [IU] | Freq: Every day | SUBCUTANEOUS | Status: DC
Start: 2014-03-31 — End: 2014-04-01
  Administered 2014-03-31: 15 [IU] via SUBCUTANEOUS
  Filled 2014-03-31 (×2): qty 0.15

## 2014-03-31 MED ORDER — ACETAMINOPHEN 325 MG PO TABS
650.0000 mg | ORAL_TABLET | Freq: Four times a day (QID) | ORAL | Status: DC | PRN
  Administered 2014-04-02: 650 mg via ORAL
  Filled 2014-03-31: qty 2

## 2014-03-31 MED ORDER — FLUCONAZOLE 100 MG PO TABS
150.0000 mg | ORAL_TABLET | Freq: Once | ORAL | Status: AC
  Administered 2014-03-31: 150 mg via ORAL
  Filled 2014-03-31: qty 2

## 2014-03-31 MED ORDER — ENOXAPARIN SODIUM 40 MG/0.4ML ~~LOC~~ SOLN
40.0000 mg | SUBCUTANEOUS | Status: DC
Start: 2014-03-31 — End: 2014-04-02
  Administered 2014-03-31 – 2014-04-02 (×3): 40 mg via SUBCUTANEOUS
  Filled 2014-03-31 (×3): qty 0.4

## 2014-03-31 NOTE — Progress Notes (Signed)
Called ER RN for report. Roomready for admit. 

## 2014-03-31 NOTE — ED Notes (Signed)
Admitting MD at BS.  

## 2014-03-31 NOTE — Progress Notes (Signed)
ANTIBIOTIC CONSULT NOTE - INITIAL  Pharmacy Consult for vancomycin and cefepime Indication: rule out sepsis  Allergies  Allergen Reactions  . Codeine Itching  . Morphine And Related Other (See Comments)    hallucinations    Patient Measurements: Height: 5\' 9"  (175.3 cm) Weight: 221 lb (100.245 kg) IBW/kg (Calculated) : 70.7 Vital Signs: Temp: 98.7 F (37.1 C) (12/20 2248) Temp Source: Oral (12/20 2248) BP: 117/72 mmHg (12/21 0315) Pulse Rate: 104 (12/21 0330)  Labs:  Recent Labs  03/30/14 2244  WBC 12.7*  HGB 13.0  PLT 236  CREATININE 0.59   Estimated Creatinine Clearance: 103.1 mL/min (by C-G formula based on Cr of 0.59).   Medical History: Past Medical History  Diagnosis Date  . Anemia   . Arthritis   . Blood transfusion   . Clotting disorder   . Diabetes mellitus   . Borderline hypertension   . Venous insufficiency   . Hyperlipidemia   . Obesity   . Diverticulosis of colon   . Hx of colonic polyps   . Low back pain   . Lipoma   . RSD (reflex sympathetic dystrophy)   . Mental disorder   . Depression     hx of   . COPD (chronic obstructive pulmonary disease)     denies  . Pneumonia     hx    Assessment: 68yo male c/o dizziness and weakness causing fall, no LOC, UA abnormal, concern for sepsis, to begin IV ABX.  Goal of Therapy:  Vancomycin trough level 15-20 mcg/ml  Plan:  Rec'd cefepime 1g in ED; will continue with vancomycin 1000mg  IV Q8H and cefepime 1g IV Q8H and monitor CBC, Cx, levels prn.  Wynona Neat, PharmD, BCPS  03/31/2014,3:49 AM

## 2014-03-31 NOTE — ED Notes (Signed)
MD at bedside. 

## 2014-03-31 NOTE — ED Notes (Signed)
Pt requesting pain medication, EDP informed, see new orders.

## 2014-03-31 NOTE — Consult Note (Signed)
dictation No. [id number] No evidence of cauda equina syndrome of acute spinal problem Pain with ROM of left LE - but grossly motor/sensory exam WNL Recommend CT w/o contrast of lumbar spine Will re-evaluate after study Defer wrist complaints to medical team

## 2014-03-31 NOTE — H&P (Signed)
[facility] History and Physical  Patient: James Parsons  QJF:[id number]  DOB: 09-May-1945  DOS: the patient was seen and examined on 03/31/2014 PCP: Alesia Richards, MD  Chief Complaint: Dizziness and lightheadedness with generalized weakness and a fall  HPI: James Parsons is a 68 y.o. 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 Patient presented with complaints of a fall. He mentions that since last 3 days he has been having generalized weakness with left leg causing significant pain as well and giving out on him. Today while he was on the bad he tried to calm out of the bed with and lost his balance and fell on the ground. His pain on the left knee become worse and he was difficult to move it at all and therefore he requested health for other family members and due to severe pain he was brought here. He denies hitting his head or neck denies any new pain in the head or neck. Denies any chest pain shortness of breath. Denies any dizziness at the time of my evaluation. Denies any focal deficit. His complaint of right upper hand pain is also new and that has been ongoing since last to 3 days. He denies any fall or injury on the right hand. He denies any constipation. Denies any burning urination nausea vomiting abdominal pain chest pain shortness of breath or cough. Denies any recent changes in her medication.  The patient is coming from home. And at his baseline independent for most of his ADL.  Review of Systems: as mentioned in the history of present illness.  A Comprehensive review of the other systems is negative.  Past Medical History  Diagnosis Date  . Anemia   . Arthritis   . Blood  transfusion   . Clotting disorder   . Diabetes mellitus   . Borderline hypertension   . Venous insufficiency   . Hyperlipidemia   . Obesity   . Diverticulosis of colon   . Hx of colonic polyps   . Low back pain   . Lipoma   . RSD (reflex sympathetic dystrophy)   . Mental disorder   . Depression     hx of   . COPD (chronic obstructive pulmonary disease)     denies  . Pneumonia     hx   Past Surgical History  Procedure Laterality Date  . Back surgery  8937,[id number]  . Morphine pump  2009    due to reaction to morphine/changed to fentanyl  . Excision of lipoma from right olecranon area  11/2000    Dr. Rise [name]  . Microdiscectomy and decompression  05/2002    Dr. Tonita Cong  . C3-4 anterior cervical discectomy and fusion with plating at c3-4  05/2006    Dr. Arnoldo Morale  . Spinal cord stimulator implanted      for pain per Dr. Maryruth Eve  . Subcut pain pump implanted    . Pain pump implantation      with fentanyl  . Tonsillectomy    . Colonoscopy w/ polypectomy    . Knee arthroscopy      right knee  . 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 Total knee arthroplasty  02/29/2012    Procedure: TOTAL KNEE ARTHROPLASTY;  Surgeon: Kerin Salen,  MD;  Location: [place];  Service: Orthopedics;  Laterality: Right;  . Lumbar laminectomy/decompression microdiscectomy N/A 01/08/2014    Procedure: L4-S1 Decompression with removal and reimplantation of spinal cord stimulator battery ;  Surgeon: Melina Schools, MD;  Location: [employer];  Service: Orthopedics;  Laterality: N/A;   Social History:  reports that he quit smoking about 11 years ago. His smoking use included Cigarettes. He has a 120 pack-year smoking history. He has never used smokeless tobacco. He reports that he does not drink alcohol or use illicit drugs.  Allergies  Allergen Reactions  . Codeine Itching  . Morphine And Related Other (See Comments)    hallucinations    Family History  Problem Relation Age of Onset  . Cancer Father    . Colon cancer Neg Hx   . Esophageal cancer Neg Hx   . Stomach cancer Neg Hx   . Rectal cancer Neg Hx     Prior to Admission medications   Medication Sig Start Date End Date Taking? Authorizing Provider  celecoxib (CELEBREX) 200 MG capsule Take 200 mg by mouth 2 (two) times daily. 01/20/14  Yes Historical Provider, MD  Cholecalciferol (VITAMIN D-3) 5000 UNITS TABS Take 5,000 Units by mouth 2 (two) times daily.   Yes Historical Provider, MD  cloNIDine (CATAPRES) 0.1 MG tablet Take 0.1 mg by mouth 2 (two) times daily.   Yes Historical Provider, MD  cyclobenzaprine (FLEXERIL) 10 MG tablet Take 10 mg by mouth 3 (three) times daily as needed for muscle spasms. Muscle spasm. 03/02/11  Yes Historical Provider, MD  DULoxetine (CYMBALTA) 60 MG capsule Take 60 mg by mouth daily.  10/09/12  Yes Historical Provider, MD  ENDOCET 10-325 MG per tablet Take 1 tablet by mouth 3 (three) times daily as needed. pain 03/08/11  Yes Historical Provider, MD  fentaNYL 0.05 MG/ML SOLN 5,000 mcg Inject into the skin continuous. Patient will bring dosage instructions with him. Daily dose 0.10m   Yes Historical Provider, MD  glimepiride (AMARYL) 4 MG tablet Take 4 mg by mouth daily with breakfast.  01/12/14  Yes Historical Provider, MD  lidocaine (LIDODERM) 5 % Place 1 patch onto the skin daily.  01/12/14  Yes Historical Provider, MD  LYRICA 150 MG capsule Take 150 mg by mouth 2 (two) times daily.  02/21/11  Yes Historical Provider, MD  metFORMIN (GLUCOPHAGE) 500 MG tablet Take 1,000 mg by mouth 2 (two) times daily with a meal.   Yes Historical Provider, MD  simvastatin (ZOCOR) 40 MG tablet Take 40 mg by mouth daily.   Yes Historical Provider, MD  sitaGLIPtin (JANUVIA) 100 MG tablet Take 50 mg by mouth daily. 11/21/13  Yes WUnk Pinto MD  tamsulosin (FLOMAX) 0.4 MG CAPS capsule Take 0.4 mg by mouth daily.  05/07/13  Yes Historical Provider, MD  traMADol (ULTRAM) 50 MG tablet Take 50 mg by mouth 3 (three) times daily as  needed. pain 02/21/11  Yes Historical Provider, MD    Physical Exam: Filed Vitals:   03/31/14 0300 03/31/14 0315 03/31/14 0330 03/31/14 0356  BP: 103/65 117/72  146/71  Pulse: 111 107 104 102  Temp:    98.6 F (37 C)  TempSrc:    Oral  Resp: '17 22 18 18  ' Height:      Weight:      SpO2: 98% 95% 95% 98%    General: Alert, Awake and Oriented to Time, Place and Person. Appear in mild distress Eyes: PERRL ENT: Oral Mucosa clear moist. Neck: no  JVD Cardiovascular: S1 and S2 Present, no Murmur, Peripheral Pulses Present Respiratory: Bilateral Air entry equal and Decreased, Clear to Auscultation, noCrackles, no wheezes Abdomen: Bowel Sound present sluggish, Soft and non tender Skin: no Rash Extremities: Right wrist redness with swelling, decreased range of motion of the right wrist no Pedal edema, no calf tenderness Decreased range of motion of the left knee with severe pain on flexion and extension and no pain on abduction and abduction or external rotation,  Neurologic: Grossly no focal neuro deficit.  Labs on Admission:  CBC:  Recent Labs Lab 03/30/14 2244 03/31/14 0510  WBC 12.7* 9.9  HGB 13.0 11.7*  HCT 38.3* 35.7*  MCV 82.7 83.0  PLT 236 207    CMP     Component Value Date/Time   NA 135* 03/31/2014 0510   K 4.4 03/31/2014 0510   CL 96 03/31/2014 0510   CO2 26 03/31/2014 0510   GLUCOSE 277* 03/31/2014 0510   BUN 14 03/31/2014 0510   CREATININE 0.62 03/31/2014 0510   CREATININE 0.65 12/24/2013 1328   CALCIUM 9.7 03/31/2014 0510   PROT 7.3 03/31/2014 0510   ALBUMIN 2.9* 03/31/2014 0510   AST 23 03/31/2014 0510   ALT 25 03/31/2014 0510   ALKPHOS 117 03/31/2014 0510   BILITOT 0.8 03/31/2014 0510   GFRNONAA >90 03/31/2014 0510   GFRNONAA >89 12/24/2013 1328   GFRAA >90 03/31/2014 0510   GFRAA >89 12/24/2013 1328    No results for input(s): LIPASE, AMYLASE in the last 168 hours. No results for input(s): AMMONIA in the last 168 hours.   Recent Labs Lab  03/31/14 0510  TROPONINI <0.30   BNP (last 3 results) No results for input(s): PROBNP in the last 8760 hours.  Radiological Exams on Admission: Dg Chest 2 View  03/31/2014   CLINICAL DATA:  Generalized weakness  EXAM: CHEST  2 VIEW  COMPARISON:  10/16/2013  FINDINGS: The heart size at upper limits of normal. Spinal stimulator noted. Both lungs are clear. The visualized skeletal structures are unremarkable but subjectively osteopenic.  IMPRESSION: No active cardiopulmonary disease.   Electronically Signed   By: Conchita Paris M.D.   On: 03/31/2014 00:45   Dg Wrist Complete Right  03/31/2014   CLINICAL DATA:  Acute right lateral wrist pain and swelling since last night without trauma  EXAM: RIGHT WRIST - COMPLETE 3+ VIEW  COMPARISON:  None.  FINDINGS: There is no evidence of fracture or dislocation. There is no evidence of arthropathy or other focal bone abnormality. Soft tissues are unremarkable. Scapholunate distance at upper limits of normal measuring 3 mm. Degenerative change noted at the first carpometacarpal joint.  IMPRESSION: No acute osseous abnormality.  Scapholunate distance of 3 mm at upper limits of normal. Correlate for point tenderness to determine chronicity.   Electronically Signed   By: Conchita Paris M.D.   On: 03/31/2014 00:44   Ct Head Wo Contrast  03/31/2014   CLINICAL DATA:  Dizziness, fall  EXAM: CT HEAD WITHOUT CONTRAST  TECHNIQUE: Contiguous axial images were obtained from the base of the skull through the vertex without intravenous contrast.  COMPARISON:  10/16/2013  FINDINGS: Mild cortical volume loss noted with proportional ventricular prominence. Areas of periventricular white matter hypodensity are most compatible with small vessel ischemic change. No acute hemorrhage, infarct, or mass lesion is identified. No midline shift. No acute osseous abnormality. Orbits and paranasal sinuses are unremarkable.  IMPRESSION: No acute intracranial finding.   Electronically Signed    By: Elzie Rings  Green M.D.   On: 03/31/2014 00:57    Assessment/Plan Principal Problem:   Sepsis Active Problems:   Essential hypertension   Chronic pain associated with significant psychosocial dysfunction   History of lumbar laminectomy for spinal cord decompression   Right hand pain   Left knee pain   Spinal cord stimulator status   1. Sepsis The patient is presenting with complaints of generalized weakness after a fall. He complains of severe pain on his left knee as well as pain on his right wrist. He denies any other symptoms. But he is found to be having tachycardia as well as the fever and leukocytosis along with elevated lactic acid levels. Currently the etiology is unclear but possible UTI is a likely, chest x-ray is clear, his body does not show any significant rash other than the right upper extremity. With this blood cultures are obtained following urine culture as well as other workup. He will be given IV hydration. Due to unclear nature of the etiology of a treated broadly with vancomycin and cefepime. Checking ESR and CRP as well as x-ray of the need to rule out any fluid collection and if there is any fluid collection that he may require orthopedic consultation. Also x-ray of the wrist shows a increased distance between the scaphoid possible suggesting effusion. May require further workup.At present dose monitoring of that   2. Chronic pain. Continuing his a fentanyl pump. Using Dilaudid. Also combination of Percocet.  3. Diabetes mellitus. Holding his oral hypoglycemic agents and continuing him on sliding scale.  4. Hypertension. Continuing clonidine from tomorrow.  Advance goals of care discussion: Full code   DVT Prophylaxis: subcutaneous Heparin Nutrition: Nothing by mouth  Family Communication: Family was present at bedside, opportunity was given to ask question and all questions were answered satisfactorily at the time of interview. Disposition:  Admitted to inpatient in telemetry unit.  Author: Berle Mull, MD [facility] Pager: [phone] 03/31/2014, 6:29 AM    If 7PM-7AM, please contact night-coverage [email or web address] Password [email or web address]

## 2014-03-31 NOTE — Progress Notes (Signed)
VASCULAR LAB PRELIMINARY  PRELIMINARY  PRELIMINARY  PRELIMINARY  Bilateral lower extremity venous duplex  completed.    Preliminary report:  Bilateral:  No evidence of DVT, superficial thrombosis, or Baker's Cyst.    Thaddaeus Granja, RVT 03/31/2014, 3:40 PM

## 2014-03-31 NOTE — Progress Notes (Signed)
PATIENT DETAILS Name: James Parsons Age: 68 y.o. Sex: male Date of Birth: Aug 11, 1945 Admit Date: 03/30/2014 Admitting Physician Berle Mull, MD MPN:[id number] DAVID, MD  Subjective: Continues to have left posterior thigh pain, and right dorsal wrist pain  Assessment/Plan: Principal Problem:   Sepsis: Suspect secondary to UTI. But given right wrist, left thing pain, recent back surgery will continue with empiric Vanco/Cefepime, and await culture data.May need more imaging if culture data if negative.  Active Problems:   UTI: Continue cefepime, await urine culture.   Left posterior thigh pain: unknown Etiology-?referred Neuropathic pain. Check Doppler-though doubt VTE, have asked Dr Rolena Infante to evaluate-spoke to him on the phone today. Unable to do MRI Back given spinal cord stimulator.    Right wrist dorsal pain-?tenosynovitis-trial of NSAID's, follow. Xray without any acute abnormalities. No swelling evident on my exam, no erythema. Decrease in range of motion.   Weakness:?from sepsis/UTI.PT eval.Treat underlying sepsis with IV Abx and follow. Non focal exam.   Chronic Low Back/Right Lower Ext Pain:Spinal cord stimulator, Fentanyl pump in place. Continue Lyrica, Cymbalta      Essential hypertension:controlled, continue with Clonidine    DM-2:continue SSI- add 15 units lantus while inpatient. Resume oral hypoglycemic agents on discharge.    YPP:[id number] Flomax  Disposition: Remain inpatient  Antibiotics: See below   Anti-infectives    Start     Dose/Rate Route Frequency Ordered Stop   03/31/14 1200  vancomycin (VANCOCIN) IVPB 1000 mg/200 mL premix     1,000 mg200 mL/hr over 60 Minutes Intravenous Every 8 hours 03/31/14 0407     03/31/14 0600  ceFEPIme (MAXIPIME) 1 g in dextrose 5 % 50 mL IVPB     1 g100 mL/hr over 30 Minutes Intravenous 3 times per day 03/31/14 0407     03/31/14 0415  vancomycin (VANCOCIN) IVPB 1000 mg/200 mL premix     1,000 mg200 mL/hr  over 60 Minutes Intravenous  Once 03/31/14 0407 03/31/14 0533   03/31/14 0315  fluconazole (DIFLUCAN) tablet 150 mg     150 mg Oral  Once 03/31/14 0301 03/31/14 0333   03/31/14 0000  ceFEPIme (MAXIPIME) 1 g in dextrose 5 % 50 mL IVPB  Status:  Discontinued     1 g100 mL/hr over 30 Minutes Intravenous Every 12 hours 03/30/14 2351 03/31/14 0359      DVT Prophylaxis: Prophylactic Lovenox   Code Status: Full code   Family Communication None at bedside  Procedures:  None  CONSULTS:  orthopedic surgery  Time spent 40 minutes-which includes 50% of the time with face-to-face with patient/ family and coordinating care related to the above assessment and plan.  MEDICATIONS: Scheduled Meds: . ceFEPime (MAXIPIME) IV  1 g Intravenous 3 times per day  . [START ON 04/01/2014] cloNIDine  0.1 mg Oral BID  . DULoxetine  60 mg Oral Daily  . enoxaparin (LOVENOX) injection  40 mg Subcutaneous Q24H  . insulin aspart  0-15 Units Subcutaneous Q6H  . miconazole nitrate   Topical BID  . naproxen  375 mg Oral BID WC  . pregabalin  150 mg Oral BID  . simvastatin  40 mg Oral Daily  . sodium chloride  3 mL Intravenous Q12H  . tamsulosin  0.4 mg Oral Daily  . vancomycin  1,000 mg Intravenous Q8H   Continuous Infusions: . sodium chloride 100 mL/hr at 03/31/14 1317   PRN Meds:.acetaminophen **OR** acetaminophen, cyclobenzaprine, fentaNYL, ondansetron **OR** ondansetron (ZOFRAN) IV, oxyCODONE-acetaminophen **AND** oxyCODONE  PHYSICAL EXAM: Vital signs in last 24 hours: Filed Vitals:   03/31/14 0315 03/31/14 0330 03/31/14 0356 03/31/14 1000  BP: 117/72  146/71 145/98  Pulse: 107 104 102 125  Temp:   98.6 F (37 C) 98.9 F (37.2 C)  TempSrc:   Oral Oral  Resp: 22 18 18 20   Height:      Weight:      SpO2: 95% 95% 98% 94%    Weight change:  Filed Weights   03/30/14 2243  Weight: 100.245 kg (221 lb)   Body mass index is 32.62 kg/(m^2).   Gen Exam: Awake and alert with clear speech.    Neck: Supple, No JVD.   Chest: B/L Clear.   CVS: S1 S2 Regular, no murmurs.  Abdomen: soft, BS +, non tender, non distended.  Extremities: no edema, lower extremities warm to touch.No swelling of right wrist, no left thigh swelling/or erythema Neurologic: Non Focal.   Skin: No Rash.   Wounds: N/A.   Intake/Output from previous day:  Intake/Output Summary (Last 24 hours) at 03/31/14 1342 Last data filed at 03/31/14 0600  Gross per 24 hour  Intake 426.67 ml  Output      0 ml  Net 426.67 ml     LAB RESULTS: CBC  Recent Labs Lab 03/30/14 2244 03/31/14 0510  WBC 12.7* 9.9  HGB 13.0 11.7*  HCT 38.3* 35.7*  PLT 236 207  MCV 82.7 83.0  MCH 28.1 27.2  MCHC 33.9 32.8  RDW 15.2 15.1    Chemistries   Recent Labs Lab 03/30/14 2244 03/31/14 0510  NA 130* 135*  K 4.3 4.4  CL 91* 96  CO2 22 26  GLUCOSE 379* 277*  BUN 15 14  CREATININE 0.59 0.62  CALCIUM 10.5 9.7    CBG:  Recent Labs Lab 03/30/14 2254 03/31/14 0227 03/31/14 0409 03/31/14 1212  GLUCAP 338* 266* 270* 247*    GFR Estimated Creatinine Clearance: 103.1 mL/min (by C-G formula based on Cr of 0.62).  Coagulation profile  Recent Labs Lab 03/31/14 0510  INR 1.24    Cardiac Enzymes  Recent Labs Lab 03/31/14 0510  TROPONINI <0.30    Invalid input(s): POCBNP No results for input(s): DDIMER in the last 72 hours.  Recent Labs  03/31/14 0510  HGBA1C 8.6*   No results for input(s): CHOL, HDL, LDLCALC, TRIG, CHOLHDL, LDLDIRECT in the last 72 hours.  Recent Labs  03/31/14 0510  TSH 0.807   No results for input(s): VITAMINB12, FOLATE, FERRITIN, TIBC, IRON, RETICCTPCT in the last 72 hours. No results for input(s): LIPASE, AMYLASE in the last 72 hours.  Urine Studies No results for input(s): UHGB, CRYS in the last 72 hours.  Invalid input(s): UACOL, UAPR, USPG, UPH, UTP, UGL, UKET, UBIL, UNIT, UROB, ULEU, UEPI, UWBC, URBC, UBAC, CAST, UCOM, BILUA  MICROBIOLOGY: No results found  for this or any previous visit (from the past 240 hour(s)).  RADIOLOGY STUDIES/RESULTS: Dg Chest 2 View  03/31/2014   CLINICAL DATA:  Generalized weakness  EXAM: CHEST  2 VIEW  COMPARISON:  10/16/2013  FINDINGS: The heart size at upper limits of normal. Spinal stimulator noted. Both lungs are clear. The visualized skeletal structures are unremarkable but subjectively osteopenic.  IMPRESSION: No active cardiopulmonary disease.   Electronically Signed   By: Conchita Paris M.D.   On: 03/31/2014 00:45   Dg Wrist Complete Right  03/31/2014   CLINICAL DATA:  Acute right lateral wrist pain and swelling since last night without trauma  EXAM: RIGHT WRIST - COMPLETE 3+ VIEW  COMPARISON:  None.  FINDINGS: There is no evidence of fracture or dislocation. There is no evidence of arthropathy or other focal bone abnormality. Soft tissues are unremarkable. Scapholunate distance at upper limits of normal measuring 3 mm. Degenerative change noted at the first carpometacarpal joint.  IMPRESSION: No acute osseous abnormality.  Scapholunate distance of 3 mm at upper limits of normal. Correlate for point tenderness to determine chronicity.   Electronically Signed   By: Conchita Paris M.D.   On: 03/31/2014 00:44   Dg Knee 1-2 Views Left  03/31/2014   CLINICAL DATA:  Medial left knee pain.  Knee gave out 2 days ago.  EXAM: LEFT KNEE - 1-2 VIEW  COMPARISON:  None.  FINDINGS: No acute fracture or dislocation is identified. There is a small knee joint effusion. Medial and lateral compartment joint space widths appear preserved. No lytic or blastic osseous lesion is identified. No radiopaque foreign body.  IMPRESSION: Small knee joint effusion.  No acute osseous abnormality identified.   Electronically Signed   By: Logan Bores   On: 03/31/2014 08:28   Ct Head Wo Contrast  03/31/2014   CLINICAL DATA:  Dizziness, fall  EXAM: CT HEAD WITHOUT CONTRAST  TECHNIQUE: Contiguous axial images were obtained from the base of the skull  through the vertex without intravenous contrast.  COMPARISON:  10/16/2013  FINDINGS: Mild cortical volume loss noted with proportional ventricular prominence. Areas of periventricular white matter hypodensity are most compatible with small vessel ischemic change. No acute hemorrhage, infarct, or mass lesion is identified. No midline shift. No acute osseous abnormality. Orbits and paranasal sinuses are unremarkable.  IMPRESSION: No acute intracranial finding.   Electronically Signed   By: Conchita Paris M.D.   On: 03/31/2014 00:57    Oren Binet, MD  [facility] Pager:[phone]  If 7PM-7AM, please contact night-coverage [email or web address] Password [email or web address] 03/31/2014, 1:42 PM   LOS: 1 day

## 2014-03-31 NOTE — Progress Notes (Signed)
Orthopedic Tech Progress Note Patient Details:  James Parsons 08/10/1945 [id number]  Ortho Devices Type of Ortho Device: Wrist splint Ortho Device/Splint Interventions: Application   Katheren Shams 03/31/2014, 12:05 AM

## 2014-03-31 NOTE — Progress Notes (Signed)
Physical Therapy Evaluation Patient Details Name: James Parsons MRN: [id number] DOB: May 24, 1945 Today's Date: 03/31/2014   History of Present Illness  Patient is a 68 yo male admitted 03/30/14 with sepsis/UTI, weakness, s/p fall.  Patient with pain in Rt hand and LLE behind knee.  X-rays reveal no fractures per reports.  PMH:  chronic pain with intrathecal pump and spinal cord stimulator, RSD, HTN, DM, COPD, depression, cervical fusion, laminectomy, Rt TKA.  Clinical Impression  Patient presents with problems listed below.  Will benefit from acute PT to maximize independence prior to discharge home with wife.  Patient will need to function at mod I level to return home - wife works and patient home alone during day.  Recommend HHPT at discharge if patient able to progress to Mod I level.  If not, may need to consider SNF at discharge.    Follow Up Recommendations Home health PT;Supervision for mobility/OOB    Equipment Recommendations  None recommended by PT    Recommendations for Other Services       Precautions / Restrictions Precautions Precautions: Fall Precaution Comments: Multiple falls at home Restrictions Weight Bearing Restrictions: No      Mobility  Bed Mobility Overal bed mobility: Needs Assistance Bed Mobility: Supine to Sit     Supine to sit: Min assist     General bed mobility comments: Verbal cues for technique.  Patient unable to use Rt hand to push up to sitting.  Assist to raise trunk to sitting position.  Transfers Overall transfer level: Needs assistance Equipment used: 1 person hand held assist Transfers: Sit to/from Stand Sit to Stand: Min assist         General transfer comment: Assist to rise to standing and for balance once upright.  Balance improved with holding onto IV pole.  Ambulation/Gait Ambulation/Gait assistance: Min assist Ambulation Distance (Feet): 20 Feet Assistive device:  (IV pole) Gait Pattern/deviations: Step-to  pattern;Decreased stance time - left;Decreased step length - right;Decreased weight shift to left;Antalgic;Trunk flexed Gait velocity: Decreased Gait velocity interpretation: Below normal speed for age/gender General Gait Details: Patient holding IV pole with Lt hand (unable to use Rt hand).  Patient with antalgic gait on LLE.  Decreased balance with gait, requiring min assist for balance/safety.    Stairs            Wheelchair Mobility    Modified Rankin (Stroke Patients Only)       Balance Overall balance assessment: Needs assistance         Standing balance support: Single extremity supported;During functional activity Standing balance-Leahy Scale: Poor                               Pertinent Vitals/Pain Pain Assessment: 0-10 Pain Score: 4  Pain Location: Rt hand; behind Lt knee Pain Descriptors / Indicators: Aching Pain Intervention(s): Limited activity within patient's tolerance;Monitored during session;Repositioned    Home Living Family/patient expects to be discharged to:: Private residence Living Arrangements: Spouse/significant other Available Help at Discharge: Family;Available PRN/intermittently (Wife works) Type of Home: House Home Access: Stairs to enter Entrance Stairs-Rails: None Technical brewer of Steps: 3 Home Layout: One level Home Equipment: Environmental consultant - 2 wheels;Cane - single point (Tall toilet)      Prior Function Level of Independence: Independent with assistive device(s)         Comments: Uses cane most of the time.  Will use RW on days with increased pain.  Independent with ADL's, drives, cooks meals.     Hand Dominance   Dominant Hand: Right    Extremity/Trunk Assessment   Upper Extremity Assessment: RUE deficits/detail RUE Deficits / Details: Decreased strength of Rt hand/UE due to hand pain RUE: Unable to fully assess due to pain       Lower Extremity Assessment: Generalized weakness;LLE deficits/detail    LLE Deficits / Details: Decreased functional strength LLE primarily due to pain.  Cervical / Trunk Assessment: Normal  Communication   Communication: No difficulties  Cognition Arousal/Alertness: Awake/alert Behavior During Therapy: WFL for tasks assessed/performed Overall Cognitive Status: Within Functional Limits for tasks assessed (Decreased safety awareness-baseline)                      General Comments      Exercises        Assessment/Plan    PT Assessment Patient needs continued PT services  PT Diagnosis Difficulty walking;Abnormality of gait;Generalized weakness;Acute pain   PT Problem List Decreased strength;Decreased activity tolerance;Decreased balance;Decreased mobility;Decreased knowledge of use of DME;Decreased safety awareness;Pain  PT Treatment Interventions DME instruction;Gait training;Stair training;Functional mobility training;Therapeutic activities;Therapeutic exercise;Patient/family education   PT Goals (Current goals can be found in the Care Plan section) Acute Rehab PT Goals Patient Stated Goal: To go home in a few days PT Goal Formulation: With patient/family Time For Goal Achievement: 04/07/14 Potential to Achieve Goals: Good    Frequency Min 4X/week   Barriers to discharge Decreased caregiver support Wife works and patient at home alone    Co-evaluation               End of Session Equipment Utilized During Treatment: Gait belt Activity Tolerance: Patient limited by pain;Patient limited by fatigue Patient left: in bed;with call bell/phone within reach;with bed alarm set;with family/visitor present (sitting EOB) Nurse Communication: Mobility status (Patient EOB with bed alarm on and wife in room)         Time: 8828-0034 PT Time Calculation (min) (ACUTE ONLY): 34 min   Charges:   PT Evaluation $Initial PT Evaluation Tier I: 1 Procedure PT Treatments $Gait Training: 8-22 mins $Therapeutic Activity: 8-22 mins   PT G  CodesDespina Pole 03/31/2014, 7:31 PM Carita Pian. Sanjuana Kava, [place] Pager [phone]

## 2014-03-31 NOTE — ED Notes (Signed)
I stat lactic acid result given to Dr. Kathrynn Humble by B. Yolanda Bonine, EMT

## 2014-03-31 NOTE — Progress Notes (Signed)
Inpatient Diabetes Program Recommendations  AACE/ADA: New Consensus Statement on Inpatient Glycemic Control (2013)  Target Ranges:  Prepandial:   less than 140 mg/dL      Peak postprandial:   less than 180 mg/dL (1-2 hours)      Critically ill patients:  140 - 180 mg/dL   Reason for Assessment:  Results for James Parsons, James Parsons (MRN [id number]) as of 03/31/2014 12:33  Ref. Range 03/30/2014 22:54 03/31/2014 02:27 03/31/2014 04:09 03/31/2014 12:12  Glucose-Capillary Latest Range: 70-99 mg/dL 338 (H) 266 (H) 270 (H) 247 (H)   Diabetes history: Type 2 diabetes  Outpatient Diabetes medications: Amaryl 4 mg daily, Metformin 1000 mg bid, Januvia 50 mg daily  Current orders for Inpatient glycemic control:  Novolog moderate q 6 hours  Note that A1C pending.  Please consider changing Novolog correction to tid with meals and HS.  Also consider starting basal insulin such as Levemir 20 units daily.  Will follow.  Adah Perl, RN, BC-ADM Inpatient Diabetes Coordinator Pager [phone]

## 2014-03-31 NOTE — Progress Notes (Signed)
Wife spoke with [facility].  The pain clinic stated that the patient should be able to last until Monday (04-07-2014) with the current fentanyl pump.  Jillyn Ledger, MBA, BS, RN

## 2014-04-01 LAB — GLUCOSE, CAPILLARY
GLUCOSE-CAPILLARY: 231 mg/dL — AB (ref 70–99)
GLUCOSE-CAPILLARY: 282 mg/dL — AB (ref 70–99)
Glucose-Capillary: 315 mg/dL — ABNORMAL HIGH (ref 70–99)

## 2014-04-01 MED ORDER — INSULIN GLARGINE 100 UNIT/ML ~~LOC~~ SOLN
20.0000 [IU] | Freq: Every day | SUBCUTANEOUS | Status: DC
  Administered 2014-04-01: 20 [IU] via SUBCUTANEOUS
  Filled 2014-04-01 (×2): qty 0.2

## 2014-04-01 NOTE — Consult Note (Signed)
James Parsons is an 68 y.o. male.   Chief Complaint: right hand/wrist pain HPI: 69 yo rhd male present with wife states he has been having pain in right wrist x 2 weeks.  No known injury and no wounds.  No fevers, chills, night sweats.  Has pain with gripping including opening jars, pulling up socks, and doing buttons.  Past Medical History  Diagnosis Date  . Anemia   . Arthritis   . Blood transfusion   . Clotting disorder   . Diabetes mellitus   . Borderline hypertension   . Venous insufficiency   . Hyperlipidemia   . Obesity   . Diverticulosis of colon   . Hx of colonic polyps   . Low back pain   . Lipoma   . RSD (reflex sympathetic dystrophy)   . Mental disorder   . Depression     hx of   . COPD (chronic obstructive pulmonary disease)     denies  . Pneumonia     hx    Past Surgical History  Procedure Laterality Date  . Back surgery  0211,1552  . Morphine pump  2009    due to reaction to morphine/changed to fentanyl  . Excision of lipoma from right olecranon area  11/2000    Dr. Rise [name]  . Microdiscectomy and decompression  05/2002    Dr. Tonita Cong  . C3-4 anterior cervical discectomy and fusion with plating at c3-4  05/2006    Dr. Arnoldo Morale  . Spinal cord stimulator implanted      for pain per Dr. Maryruth Eve  . Subcut pain pump implanted    . Pain pump implantation      with fentanyl  . Tonsillectomy    . Colonoscopy w/ polypectomy    . Knee arthroscopy      right knee  . 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 Total knee arthroplasty  02/29/2012    Procedure: TOTAL KNEE ARTHROPLASTY;  Surgeon: Kerin Salen, MD;  Location: [facility];  Service: Orthopedics;  Laterality: Right;  . Lumbar laminectomy/decompression microdiscectomy N/A 01/08/2014    Procedure: L4-S1 Decompression with removal and reimplantation of spinal cord stimulator battery ;  Surgeon: Melina Schools, MD;  Location: [facility];  Service: Orthopedics;  Laterality: N/A;    Family History  Problem  Relation Age of Onset  . Cancer Father   . Colon cancer Neg Hx   . Esophageal cancer Neg Hx   . Stomach cancer Neg Hx   . Rectal cancer Neg Hx    Social History:  reports that he quit smoking about 11 years ago. His smoking use included Cigarettes. He has a 120 pack-year smoking history. He has never used smokeless tobacco. He reports that he does not drink alcohol or use illicit drugs.  Allergies:  Allergies  Allergen Reactions  . Codeine Itching  . Morphine And Related Other (See Comments)    hallucinations    Medications Prior to Admission  Medication Sig Dispense Refill  . celecoxib (CELEBREX) 200 MG capsule Take 200 mg by mouth 2 (two) times daily.  2  . Cholecalciferol (VITAMIN D-3) 5000 UNITS TABS Take 5,000 Units by mouth 2 (two) times daily.    . cloNIDine (CATAPRES) 0.1 MG tablet Take 0.1 mg by mouth 2 (two) times daily.    . cyclobenzaprine (FLEXERIL) 10 MG tablet Take 10 mg by mouth 3 (three) times daily as needed for muscle spasms. Muscle spasm.    . DULoxetine (  CYMBALTA) 60 MG capsule Take 60 mg by mouth daily.     . ENDOCET 10-325 MG per tablet Take 1 tablet by mouth 3 (three) times daily as needed. pain    . fentaNYL 0.05 MG/ML SOLN 5,000 mcg Inject into the skin continuous. Patient will bring dosage instructions with him. Daily dose 0.25m    . glimepiride (AMARYL) 4 MG tablet Take 4 mg by mouth daily with breakfast.   0  . lidocaine (LIDODERM) 5 % Place 1 patch onto the skin daily.   0  . LYRICA 150 MG capsule Take 150 mg by mouth 2 (two) times daily.     . metFORMIN (GLUCOPHAGE) 500 MG tablet Take 1,000 mg by mouth 2 (two) times daily with a meal.    . simvastatin (ZOCOR) 40 MG tablet Take 40 mg by mouth daily.    . sitaGLIPtin (JANUVIA) 100 MG tablet Take 50 mg by mouth daily.    . tamsulosin (FLOMAX) 0.4 MG CAPS capsule Take 0.4 mg by mouth daily.     . traMADol (ULTRAM) 50 MG tablet Take 50 mg by mouth 3 (three) times daily as needed. pain      Results for  orders placed or performed during the hospital encounter of 03/30/14 (from the past 48 hour(s))  CBC     Status: Abnormal   Collection Time: 03/30/14 10:44 PM  Result Value Ref Range   WBC 12.7 (H) 4.0 - 10.5 K/uL   RBC 4.63 4.22 - 5.81 MIL/uL   Hemoglobin 13.0 13.0 - 17.0 g/dL   HCT 38.3 (L) 39.0 - 52.0 %   MCV 82.7 78.0 - 100.0 fL   MCH 28.1 26.0 - 34.0 pg   MCHC 33.9 30.0 - 36.0 g/dL   RDW 15.2 11.5 - 15.5 %   Platelets 236 150 - 400 K/uL  Comprehensive metabolic panel     Status: Abnormal   Collection Time: 03/30/14 10:44 PM  Result Value Ref Range   Sodium 130 (L) 137 - 147 mEq/L   Potassium 4.3 3.7 - 5.3 mEq/L   Chloride 91 (L) 96 - 112 mEq/L   CO2 22 19 - 32 mEq/L   Glucose, Bld 379 (H) 70 - 99 mg/dL   BUN 15 6 - 23 mg/dL   Creatinine, Ser 0.59 0.50 - 1.35 mg/dL   Calcium 10.5 8.4 - 10.5 mg/dL   Total Protein 8.1 6.0 - 8.3 g/dL   Albumin 3.3 (L) 3.5 - 5.2 g/dL   AST 20 0 - 37 U/L   ALT 29 0 - 53 U/L   Alkaline Phosphatase 132 (H) 39 - 117 U/L   Total Bilirubin 0.7 0.3 - 1.2 mg/dL   GFR calc non Af Amer >90 >90 mL/min   GFR calc Af Amer >90 >90 mL/min    Comment: (NOTE) The eGFR has been calculated using the CKD EPI equation. This calculation has not been validated in all clinical situations. eGFR's persistently <90 mL/min signify possible Chronic Kidney Disease.    Anion gap 17 (H) 5 - 15  Urinalysis, Routine w reflex microscopic     Status: Abnormal   Collection Time: 03/30/14 10:47 PM  Result Value Ref Range   Color, Urine YELLOW YELLOW   APPearance TURBID (A) CLEAR   Specific Gravity, Urine 1.033 (H) 1.005 - 1.030   pH 6.0 5.0 - 8.0   Glucose, UA >1000 (A) NEGATIVE mg/dL   Hgb urine dipstick SMALL (A) NEGATIVE   Bilirubin Urine NEGATIVE NEGATIVE   Ketones,  ur NEGATIVE NEGATIVE mg/dL   Protein, ur 30 (A) NEGATIVE mg/dL   Urobilinogen, UA 1.0 0.0 - 1.0 mg/dL   Nitrite NEGATIVE NEGATIVE   Leukocytes, UA MODERATE (A) NEGATIVE  Urine culture     Status: None  (Preliminary result)   Collection Time: 03/30/14 10:47 PM  Result Value Ref Range   Specimen Description URINE, CLEAN CATCH    Special Requests NONE    Culture  Setup Time      03/31/2014 10:20 Performed at [place]      >=100,000 COLONIES/ML Performed at [place] Performed at [facility]    Report Status PENDING   Urine microscopic-add on     Status: Abnormal   Collection Time: 03/30/14 10:47 PM  Result Value Ref Range   Squamous Epithelial / LPF RARE RARE   WBC, UA TOO NUMEROUS TO COUNT <3 WBC/hpf   RBC / HPF 7-10 <3 RBC/hpf   Bacteria, UA MANY (A) RARE  POC CBG, ED     Status: Abnormal   Collection Time: 03/30/14 10:54 PM  Result Value Ref Range   Glucose-Capillary 338 (H) 70 - 99 mg/dL  Culture, blood (routine x 2)     Status: None (Preliminary result)   Collection Time: 03/31/14 12:06 AM  Result Value Ref Range   Specimen Description BLOOD LEFT ANTECUBITAL    Special Requests BOTTLES DRAWN AEROBIC AND ANAEROBIC 5CC EA    Culture  Setup Time      03/31/2014 10:18 Performed at [place] NO GROWTH TO DATE CULTURE WILL BE HELD FOR 5 DAYS BEFORE ISSUING A FINAL NEGATIVE REPORT Performed at [facility]    Report Status PENDING   Culture, blood (routine x 2)     Status: None (Preliminary result)   Collection Time: 03/31/14 12:12 AM  Result Value Ref Range   Specimen Description BLOOD LEFT WRIST    Special Requests BOTTLES DRAWN AEROBIC ONLY 8CC    Culture  Setup Time      03/31/2014 10:18 Performed at [place] NO GROWTH TO DATE CULTURE WILL BE HELD FOR 5 DAYS BEFORE ISSUING A FINAL NEGATIVE REPORT Performed at [facility]    Report Status PENDING   I-Stat CG4 Lactic Acid, ED     Status: Abnormal   Collection Time: 03/31/14 12:20 AM  Result Value Ref  Range   Lactic Acid, Venous 2.42 (H) 0.5 - 2.2 mmol/L  CBG monitoring, ED     Status: Abnormal   Collection Time: 03/31/14  2:27 AM  Result Value Ref Range   Glucose-Capillary 266 (H) 70 - 99 mg/dL  Glucose, capillary     Status: Abnormal   Collection Time: 03/31/14  4:09 AM  Result Value Ref Range   Glucose-Capillary 270 (H) 70 - 99 mg/dL  TSH     Status: None   Collection Time: 03/31/14  5:10 AM  Result Value Ref Range   TSH 0.807 0.350 - 4.500 uIU/mL  Troponin I     Status: None   Collection Time: 03/31/14  5:10 AM  Result Value Ref Range   Troponin I <0.30 <0.30 ng/mL    Comment:  Due to the release kinetics of cTnI, a negative result within the first hours of the onset of symptoms does not rule out myocardial infarction with certainty. If myocardial infarction is still suspected, repeat the test at appropriate intervals.   Hemoglobin A1c     Status: Abnormal   Collection Time: 03/31/14  5:10 AM  Result Value Ref Range   Hgb A1c MFr Bld 8.6 (H) <5.7 %    Comment: (NOTE)                                                                       According to the ADA Clinical Practice Recommendations for 2011, when HbA1c is used as a screening test:  >=6.5%   Diagnostic of Diabetes Mellitus           (if abnormal result is confirmed) 5.7-6.4%   Increased risk of developing Diabetes Mellitus References:Diagnosis and Classification of Diabetes Mellitus,Diabetes YTKZ,6010,93(ATFTD 1):S62-S69 and Standards of Medical Care in         Diabetes - 2011,Diabetes Care,2011,34 (Suppl 1):S11-S61.    Mean Plasma Glucose 200 (H) <117 mg/dL    Comment: Performed at [place] metabolic panel     Status: Abnormal   Collection Time: 03/31/14  5:10 AM  Result Value Ref Range   Sodium 135 (L) 137 - 147 mEq/L   Potassium 4.4 3.7 - 5.3 mEq/L   Chloride 96 96 - 112 mEq/L   CO2 26 19 - 32 mEq/L   Glucose, Bld 277 (H) 70 - 99 mg/dL   BUN 14 6 - 23 mg/dL    Creatinine, Ser 0.62 0.50 - 1.35 mg/dL   Calcium 9.7 8.4 - 10.5 mg/dL   Total Protein 7.3 6.0 - 8.3 g/dL   Albumin 2.9 (L) 3.5 - 5.2 g/dL   AST 23 0 - 37 U/L   ALT 25 0 - 53 U/L   Alkaline Phosphatase 117 39 - 117 U/L   Total Bilirubin 0.8 0.3 - 1.2 mg/dL   GFR calc non Af Amer >90 >90 mL/min   GFR calc Af Amer >90 >90 mL/min    Comment: (NOTE) The eGFR has been calculated using the CKD EPI equation. This calculation has not been validated in all clinical situations. eGFR's persistently <90 mL/min signify possible Chronic Kidney Disease.    Anion gap 13 5 - 15  CBC     Status: Abnormal   Collection Time: 03/31/14  5:10 AM  Result Value Ref Range   WBC 9.9 4.0 - 10.5 K/uL   RBC 4.30 4.22 - 5.81 MIL/uL   Hemoglobin 11.7 (L) 13.0 - 17.0 g/dL   HCT 35.7 (L) 39.0 - 52.0 %   MCV 83.0 78.0 - 100.0 fL   MCH 27.2 26.0 - 34.0 pg   MCHC 32.8 30.0 - 36.0 g/dL   RDW 15.1 11.5 - 15.5 %   Platelets 207 150 - 400 K/uL  Protime-INR     Status: Abnormal   Collection Time: 03/31/14  5:10 AM  Result Value Ref Range   Prothrombin Time 15.8 (H) 11.6 - 15.2 seconds   INR 1.24 0.00 - 1.49  APTT     Status: None   Collection Time: 03/31/14  5:10 AM  Result  Value Ref Range   aPTT 34 24 - 37 seconds  Sedimentation rate     Status: Abnormal   Collection Time: 03/31/14  5:10 AM  Result Value Ref Range   Sed Rate 89 (H) 0 - 16 mm/hr  C-reactive protein     Status: Abnormal   Collection Time: 03/31/14  5:10 AM  Result Value Ref Range   CRP 33.5 (H) <0.60 mg/dL    Comment: (NOTE) Result repeated and verified. Result confirmed by automatic dilution. Performed at [facility]   Uric acid     Status: Abnormal   Collection Time: 03/31/14  5:10 AM  Result Value Ref Range   Uric Acid, Serum 2.2 (L) 4.0 - 7.8 mg/dL  Glucose, capillary     Status: Abnormal   Collection Time: 03/31/14 12:12 PM  Result Value Ref Range   Glucose-Capillary 247 (H) 70 - 99 mg/dL   Comment 1 Documented in Chart     Comment 2 Notify RN   Glucose, capillary     Status: Abnormal   Collection Time: 03/31/14  5:55 PM  Result Value Ref Range   Glucose-Capillary 330 (H) 70 - 99 mg/dL  Glucose, capillary     Status: Abnormal   Collection Time: 03/31/14 11:58 PM  Result Value Ref Range   Glucose-Capillary 218 (H) 70 - 99 mg/dL  Glucose, capillary     Status: Abnormal   Collection Time: 04/01/14  5:56 AM  Result Value Ref Range   Glucose-Capillary 231 (H) 70 - 99 mg/dL  Glucose, capillary     Status: Abnormal   Collection Time: 04/01/14 12:06 PM  Result Value Ref Range   Glucose-Capillary 282 (H) 70 - 99 mg/dL   Comment 1 Notify RN   Glucose, capillary     Status: Abnormal   Collection Time: 04/01/14  5:20 PM  Result Value Ref Range   Glucose-Capillary 315 (H) 70 - 99 mg/dL    Dg Chest 2 View  03/31/2014   CLINICAL DATA:  Generalized weakness  EXAM: CHEST  2 VIEW  COMPARISON:  10/16/2013  FINDINGS: The heart size at upper limits of normal. Spinal stimulator noted. Both lungs are clear. The visualized skeletal structures are unremarkable but subjectively osteopenic.  IMPRESSION: No active cardiopulmonary disease.   Electronically Signed   By: James Parsons M.D.   On: 03/31/2014 00:45   Dg Wrist Complete Right  03/31/2014   CLINICAL DATA:  Acute right lateral wrist pain and swelling since last night without trauma  EXAM: RIGHT WRIST - COMPLETE 3+ VIEW  COMPARISON:  None.  FINDINGS: There is no evidence of fracture or dislocation. There is no evidence of arthropathy or other focal bone abnormality. Soft tissues are unremarkable. Scapholunate distance at upper limits of normal measuring 3 mm. Degenerative change noted at the first carpometacarpal joint.  IMPRESSION: No acute osseous abnormality.  Scapholunate distance of 3 mm at upper limits of normal. Correlate for point tenderness to determine chronicity.   Electronically Signed   By: James Parsons M.D.   On: 03/31/2014 00:44   Dg Knee 1-2 Views  Left  03/31/2014   CLINICAL DATA:  Medial left knee pain.  Knee gave out 2 days ago.  EXAM: LEFT KNEE - 1-2 VIEW  COMPARISON:  None.  FINDINGS: No acute fracture or dislocation is identified. There is a small knee joint effusion. Medial and lateral compartment joint space widths appear preserved. No lytic or blastic osseous lesion is identified. No radiopaque foreign body.  IMPRESSION: Small knee joint effusion.  No acute osseous abnormality identified.   Electronically Signed   By: Logan Bores   On: 03/31/2014 08:28   Ct Head Wo Contrast  03/31/2014   CLINICAL DATA:  Dizziness, fall  EXAM: CT HEAD WITHOUT CONTRAST  TECHNIQUE: Contiguous axial images were obtained from the base of the skull through the vertex without intravenous contrast.  COMPARISON:  10/16/2013  FINDINGS: Mild cortical volume loss noted with proportional ventricular prominence. Areas of periventricular white matter hypodensity are most compatible with small vessel ischemic change. No acute hemorrhage, infarct, or mass lesion is identified. No midline shift. No acute osseous abnormality. Orbits and paranasal sinuses are unremarkable.  IMPRESSION: No acute intracranial finding.   Electronically Signed   By: James Parsons M.D.   On: 03/31/2014 00:57   Ct Lumbar Spine Wo Contrast  03/31/2014   CLINICAL DATA:  68 year old male post fall yesterday with left leg pain. Spine stimulator device in place and unable to have MR. Initial encounter.  EXAM: CT LUMBAR SPINE WITHOUT CONTRAST  TECHNIQUE: Multidetector CT imaging of the lumbar spine was performed without intravenous contrast administration. Multiplanar CT image reconstructions were also generated.  COMPARISON:  Intraoperative lateral view of the lumbar spine 01/08/2014. Postmyelogram CT 09/04/2013.  FINDINGS: Last fully open disk space is labeled L5-S1. Present examination incorporates from T12-L1 disc space through the lower sacrum.  Atherosclerotic type changes of the abdominal aorta  and involving branch vessels. No focal abdominal aortic aneurysm. Mildly ectatic iliac arteries.  Stimulating device leads at the T12-L1, L1-2 and L2-3 disc space level directed superiorly similar to prior exam.  No acute fracture detected.  T12-L1: Facet joint degenerative changes and bony overgrowth greater on the right. Stimulating device leads is in place. Narrowing of the thecal sac greater on the right. Foraminal narrowing greater on the right.  L1-2: Facet joint degenerative changes and bony overgrowth greater on the right. Superior aspect of the L2 facet extends into the neural foramen greater on right with severe right foraminal narrowing and moderate left foraminal narrowing. Disc osteophyte complex greater right lateral position. Stimulating device in place. Moderate spinal stenosis greater on the right.  L2-3: Facet joint degenerative changes and bony overgrowth. Ligamentum flavum hypertrophy partially calcified greater on the right. Disc osteophyte complex. Severe right-sided and moderate to marked left-sided spinal stenosis and lateral recess stenosis. Mild bilateral foraminal narrowing.  L3-4: Facet joint degenerative changes and bony overgrowth greater on the right. Disc osteophyte complex greater right paracentral position. Marked right-sided and moderate left-sided lateral recess stenosis and spinal stenosis. Foraminal extension of disc osteophyte on the right with moderate foraminal narrowing and encroachment upon the exiting right L3 nerve root. Mild to slightly moderate left foraminal narrowing.  L4-5: Prominent bilateral facet joint degenerative changes. Ligamentum flavum hypertrophy. Prior laminectomy. Bulge greater right foraminal position. Moderate to marked right-sided and moderate left foraminal narrowing. Small amount a gas within the narrowed left neural foramen may represent gas containing synovial cyst. Difficult to evaluate the thecal sac without intrathecal contrast given the degree  of significant postoperative changes.  L5-S1: Facet joint degenerative changes. Bulge with osteophyte and foraminal extension contributing to moderate foraminal narrowing greater on the left. Post laminectomy. Difficult to evaluate the thecal sac without into thecal contrast.  IMPRESSION: Summary of pertinent findings includes:  T12-L1 multifactorial moderate narrowing of the thecal sac greater on the right. Foraminal narrowing greater on the right.  L1-2 multifactorial severe right foraminal narrowing and moderate left foraminal  narrowing. Moderate spinal stenosis greater on the right.  L2-3 multifactorial severe right-sided and moderate to marked left-sided spinal stenosis and lateral recess stenosis. Mild bilateral foraminal narrowing.  L3-4 multifactorial marked right-sided and moderate left-sided lateral recess stenosis and spinal stenosis. Multifactorial moderate right foraminal narrowing and encroachment upon the exiting right L3 nerve root. Mild to slightly moderate left foraminal narrowing.  L4-5 multifactorial moderate to marked right-sided and moderate left foraminal narrowing. Small amount a gas within the narrowed left neural foramen may represent gas containing synovial cyst. Post laminectomy. Difficult to evaluate the thecal sac without intrathecal contrast given the degree of significant postoperative changes.  L5-S1 multifactorial moderate foraminal narrowing greater on the left. Post laminectomy. Difficult to evaluate the thecal sac without into thecal contrast.  Please see above for further detail.   Electronically Signed   By: Chauncey Cruel M.D.   On: 03/31/2014 21:50     A comprehensive review of systems was negative.  Blood pressure 151/71, pulse 85, temperature 97.7 F (36.5 C), temperature source Oral, resp. rate 18, height '5\' 9"'  (1.753 m), weight 97 kg (213 lb 13.5 oz), SpO2 99 %.  General appearance: alert, cooperative and appears stated age Head: Normocephalic, without obvious  abnormality, atraumatic Neck: supple, symmetrical, trachea midline Extremities: intact sensation and capillary refill all digits.  +epl/fpl/io.  no wounds.  mild rubor dorsum of hand without tenderness.  no proximal streaking.  very ttp at cmc joint and positive grind test reproducing pain.   Pulses: 2+ and symmetric Skin: Skin color, texture, turgor normal. No rashes or lesions Neurologic: Grossly normal Incision/Wound: none  Assessment/Plan Right thumb cmc osteoarthritis.  Significant degenerative changes noted on XR.  Recommend thumb spica splint when IV moved, antiinflammatories.  Discussed possibility of injection of joint if other measures do not help and surgical intervention if no other relief.  Follow up as outpatient.  Questions answered.  Patient agrees with plan of care.  Shontia Gillooly R 04/01/2014, 7:41 PM

## 2014-04-01 NOTE — Progress Notes (Signed)
PATIENT DETAILS Name: James Parsons Age: 68 y.o. Sex: male Date of Birth: [birth year] Admit Date: 03/30/2014 Admitting Physician Berle Mull, MD JAS:[id number] DAVID, MD  Subjective: Continues to have pain, decreased range of motion in the right wrist. Mild swelling/erythema in the dorsum of the wrist today  Assessment/Plan: Principal Problem:   Sepsis: Suspect secondary to UTI. But given right wrist, left thing pain, recent back surgery will continue with empiric Vanco/Cefepime,CT LS Spine negative for any obvious infectious etiology.Await final Urine cx-prelim shows E Coli, sensitivities pending.  Active Problems:   UTI: Continue cefepime, Await final Urine cx-prelim shows E Coli, sensitivities pending.   Left posterior thigh pain: unknown Etiology-?referred Neuropathic pain. B/L Lower Ext Doppler-negative for DVT.Appreciate Ortho- Dr Rolena Infante input. Await follow up, CT LS spine report noted.  Unable to do MRI Back given spinal cord stimulator.    ? Right wrist infection vs tenosynovitis:On empiric Vanco/Cefepime since admit, on trial of trial of NSAID's since 12/21-not much response. Instead today-with more swelling/erythema. Continue empiric Vanco/Cefepime, have consulted Hand surgery-?aspirate wrist. Uric acid levels were normal, no prior hx of gout. Xray without any acute abnormalities.   Weakness:?from sepsis/UTI.PT eval.Treat underlying sepsis with IV Abx and follow. Non focal exam.   Chronic Low Back/Right Lower Ext Pain:Spinal cord stimulator, Fentanyl pump in place. Continue Lyrica, Cymbalta  . Per patient-he needs Fentanyl pump refilled by 12/28 (followed at [facility])    Essential hypertension:controlled, continue with Clonidine    DM-2:continue SSI- increase Lantus to 20 units lantus while inpatient. Resume oral hypoglycemic agents on discharge.    TKW:IOXBDZHG Flomax  Disposition: Remain inpatient  Antibiotics: See below   Anti-infectives    Start      Dose/Rate Route Frequency Ordered Stop   03/31/14 1200  vancomycin (VANCOCIN) IVPB 1000 mg/200 mL premix     1,000 mg200 mL/hr over 60 Minutes Intravenous Every 8 hours 03/31/14 0407     03/31/14 0600  ceFEPIme (MAXIPIME) 1 g in dextrose 5 % 50 mL IVPB     1 g100 mL/hr over 30 Minutes Intravenous 3 times per day 03/31/14 0407     03/31/14 0415  vancomycin (VANCOCIN) IVPB 1000 mg/200 mL premix     1,000 mg200 mL/hr over 60 Minutes Intravenous  Once 03/31/14 0407 03/31/14 0533   03/31/14 0315  fluconazole (DIFLUCAN) tablet 150 mg     150 mg Oral  Once 03/31/14 0301 03/31/14 0333   03/31/14 0000  ceFEPIme (MAXIPIME) 1 g in dextrose 5 % 50 mL IVPB  Status:  Discontinued     1 g100 mL/hr over 30 Minutes Intravenous Every 12 hours 03/30/14 2351 03/31/14 0359      DVT Prophylaxis: Prophylactic Lovenox   Code Status: Full code   Family Communication None at bedside  Procedures:  None  CONSULTS:  orthopedic surgery  Time spent 40 minutes-which includes 50% of the time with face-to-face with patient/ family and coordinating care related to the above assessment and plan.  MEDICATIONS: Scheduled Meds: . ceFEPime (MAXIPIME) IV  1 g Intravenous 3 times per day  . cloNIDine  0.1 mg Oral BID  . DULoxetine  60 mg Oral Daily  . enoxaparin (LOVENOX) injection  40 mg Subcutaneous Q24H  . insulin aspart  0-15 Units Subcutaneous Q6H  . insulin glargine  15 Units Subcutaneous QHS  . miconazole nitrate   Topical BID  . naproxen  375 mg Oral BID WC  . pregabalin  150 mg Oral  BID  . simvastatin  40 mg Oral Daily  . sodium chloride  3 mL Intravenous Q12H  . tamsulosin  0.4 mg Oral Daily  . vancomycin  1,000 mg Intravenous Q8H   Continuous Infusions: . sodium chloride 100 mL/hr at 04/01/14 0007   PRN Meds:.acetaminophen **OR** acetaminophen, cyclobenzaprine, fentaNYL, ondansetron **OR** ondansetron (ZOFRAN) IV, oxyCODONE-acetaminophen **AND** oxyCODONE    PHYSICAL EXAM: Vital signs in  last 24 hours: Filed Vitals:   03/31/14 1859 03/31/14 2005 04/01/14 0559 04/01/14 0800  BP: 99/53 154/78 151/69 151/82  Pulse: 108 99 86 107  Temp: 98.4 F (36.9 C) 98.2 F (36.8 C) 98.4 F (36.9 C) 97.5 F (36.4 C)  TempSrc: Oral Oral Oral Oral  Resp: 20 19 17 20   Height:      Weight:  97 kg (213 lb 13.5 oz)    SpO2: 99% 96% 97% 98%    Weight change: -3.245 kg (-7 lb 2.5 oz) Filed Weights   03/30/14 2243 03/31/14 2005  Weight: 100.245 kg (221 lb) 97 kg (213 lb 13.5 oz)   Body mass index is 31.57 kg/(m^2).   Gen Exam: Awake and alert with clear speech.   Neck: Supple, No JVD.   Chest: B/L Clear.   CVS: S1 S2 Regular, no murmurs.  Abdomen: soft, BS +, non tender, non distended.  Extremities: no edema, lower extremities warm to touch.Mild swelling, erythema in right dorsal wrist/dorsum hand. Decrease range of motion.  Neurologic: Non Focal.   Skin: No Rash.   Wounds: N/A.   Intake/Output from previous day:  Intake/Output Summary (Last 24 hours) at 04/01/14 1241 Last data filed at 04/01/14 1000  Gross per 24 hour  Intake   3270 ml  Output   2350 ml  Net    920 ml     LAB RESULTS: CBC  Recent Labs Lab 03/30/14 2244 03/31/14 0510  WBC 12.7* 9.9  HGB 13.0 11.7*  HCT 38.3* 35.7*  PLT 236 207  MCV 82.7 83.0  MCH 28.1 27.2  MCHC 33.9 32.8  RDW 15.2 15.1    Chemistries   Recent Labs Lab 03/30/14 2244 03/31/14 0510  NA 130* 135*  K 4.3 4.4  CL 91* 96  CO2 22 26  GLUCOSE 379* 277*  BUN 15 14  CREATININE 0.59 0.62  CALCIUM 10.5 9.7    CBG:  Recent Labs Lab 03/31/14 1212 03/31/14 1755 03/31/14 2358 04/01/14 0556 04/01/14 1206  GLUCAP 247* 330* 218* 231* 282*    GFR Estimated Creatinine Clearance: 101.5 mL/min (by C-G formula based on Cr of 0.62).  Coagulation profile  Recent Labs Lab 03/31/14 0510  INR 1.24    Cardiac Enzymes  Recent Labs Lab 03/31/14 0510  TROPONINI <0.30    Invalid input(s): POCBNP No results for  input(s): DDIMER in the last 72 hours.  Recent Labs  03/31/14 0510  HGBA1C 8.6*   No results for input(s): CHOL, HDL, LDLCALC, TRIG, CHOLHDL, LDLDIRECT in the last 72 hours.  Recent Labs  03/31/14 0510  TSH 0.807   No results for input(s): VITAMINB12, FOLATE, FERRITIN, TIBC, IRON, RETICCTPCT in the last 72 hours. No results for input(s): LIPASE, AMYLASE in the last 72 hours.  Urine Studies No results for input(s): UHGB, CRYS in the last 72 hours.  Invalid input(s): UACOL, UAPR, USPG, UPH, UTP, UGL, UKET, UBIL, UNIT, UROB, ULEU, UEPI, UWBC, URBC, UBAC, CAST, UCOM, BILUA  MICROBIOLOGY: Recent Results (from the past 240 hour(s))  Urine culture     Status: None (Preliminary  result)   Collection Time: 03/30/14 10:47 PM  Result Value Ref Range Status   Specimen Description URINE, CLEAN CATCH  Final   Special Requests NONE  Final   Culture  Setup Time   Final    03/31/2014 10:20 Performed at [facility]   Final    >=100,000 COLONIES/ML Performed at [facility]    Culture   Final    [place] Performed at [facility]    Report Status PENDING  Incomplete  Culture, blood (routine x 2)     Status: None (Preliminary result)   Collection Time: 03/31/14 12:06 AM  Result Value Ref Range Status   Specimen Description BLOOD LEFT ANTECUBITAL  Final   Special Requests BOTTLES DRAWN AEROBIC AND ANAEROBIC 5CC EA  Final   Culture  Setup Time   Final    03/31/2014 10:18 Performed at [facility]    Culture   Final           BLOOD CULTURE RECEIVED NO GROWTH TO DATE CULTURE WILL BE HELD FOR 5 DAYS BEFORE ISSUING A FINAL NEGATIVE REPORT Performed at [facility]    Report Status PENDING  Incomplete  Culture, blood (routine x 2)     Status: None (Preliminary result)   Collection Time: 03/31/14 12:12 AM  Result Value Ref Range Status   Specimen Description BLOOD LEFT WRIST  Final   Special Requests BOTTLES DRAWN AEROBIC  ONLY 8CC  Final   Culture  Setup Time   Final    03/31/2014 10:18 Performed at [facility]    Culture   Final           BLOOD CULTURE RECEIVED NO GROWTH TO DATE CULTURE WILL BE HELD FOR 5 DAYS BEFORE ISSUING A FINAL NEGATIVE REPORT Performed at [facility]    Report Status PENDING  Incomplete    RADIOLOGY STUDIES/RESULTS: Dg Chest 2 View  03/31/2014   CLINICAL DATA:  Generalized weakness  EXAM: CHEST  2 VIEW  COMPARISON:  10/16/2013  FINDINGS: The heart size at upper limits of normal. Spinal stimulator noted. Both lungs are clear. The visualized skeletal structures are unremarkable but subjectively osteopenic.  IMPRESSION: No active cardiopulmonary disease.   Electronically Signed   By: Conchita Paris M.D.   On: 03/31/2014 00:45   Dg Wrist Complete Right  03/31/2014   CLINICAL DATA:  Acute right lateral wrist pain and swelling since last night without trauma  EXAM: RIGHT WRIST - COMPLETE 3+ VIEW  COMPARISON:  None.  FINDINGS: There is no evidence of fracture or dislocation. There is no evidence of arthropathy or other focal bone abnormality. Soft tissues are unremarkable. Scapholunate distance at upper limits of normal measuring 3 mm. Degenerative change noted at the first carpometacarpal joint.  IMPRESSION: No acute osseous abnormality.  Scapholunate distance of 3 mm at upper limits of normal. Correlate for point tenderness to determine chronicity.   Electronically Signed   By: Conchita Paris M.D.   On: 03/31/2014 00:44   Dg Knee 1-2 Views Left  03/31/2014   CLINICAL DATA:  Medial left knee pain.  Knee gave out 2 days ago.  EXAM: LEFT KNEE - 1-2 VIEW  COMPARISON:  None.  FINDINGS: No acute fracture or dislocation is identified. There is a small knee joint effusion. Medial and lateral compartment joint space widths appear preserved. No lytic or blastic osseous lesion is identified. No radiopaque foreign body.  IMPRESSION: Small knee  joint effusion.  No acute osseous  abnormality identified.   Electronically Signed   By: Logan Bores   On: 03/31/2014 08:28   Ct Head Wo Contrast  03/31/2014   CLINICAL DATA:  Dizziness, fall  EXAM: CT HEAD WITHOUT CONTRAST  TECHNIQUE: Contiguous axial images were obtained from the base of the skull through the vertex without intravenous contrast.  COMPARISON:  10/16/2013  FINDINGS: Mild cortical volume loss noted with proportional ventricular prominence. Areas of periventricular white matter hypodensity are most compatible with small vessel ischemic change. No acute hemorrhage, infarct, or mass lesion is identified. No midline shift. No acute osseous abnormality. Orbits and paranasal sinuses are unremarkable.  IMPRESSION: No acute intracranial finding.   Electronically Signed   By: Conchita Paris M.D.   On: 03/31/2014 00:57   Ct Lumbar Spine Wo Contrast  03/31/2014   CLINICAL DATA:  68 year old male post fall yesterday with left leg pain. Spine stimulator device in place and unable to have MR. Initial encounter.  EXAM: CT LUMBAR SPINE WITHOUT CONTRAST  TECHNIQUE: Multidetector CT imaging of the lumbar spine was performed without intravenous contrast administration. Multiplanar CT image reconstructions were also generated.  COMPARISON:  Intraoperative lateral view of the lumbar spine 01/08/2014. Postmyelogram CT 09/04/2013.  FINDINGS: Last fully open disk space is labeled L5-S1. Present examination incorporates from T12-L1 disc space through the lower sacrum.  Atherosclerotic type changes of the abdominal aorta and involving branch vessels. No focal abdominal aortic aneurysm. Mildly ectatic iliac arteries.  Stimulating device leads at the T12-L1, L1-2 and L2-3 disc space level directed superiorly similar to prior exam.  No acute fracture detected.  T12-L1: Facet joint degenerative changes and bony overgrowth greater on the right. Stimulating device leads is in place. Narrowing of the thecal sac greater on the right. Foraminal narrowing  greater on the right.  L1-2: Facet joint degenerative changes and bony overgrowth greater on the right. Superior aspect of the L2 facet extends into the neural foramen greater on right with severe right foraminal narrowing and moderate left foraminal narrowing. Disc osteophyte complex greater right lateral position. Stimulating device in place. Moderate spinal stenosis greater on the right.  L2-3: Facet joint degenerative changes and bony overgrowth. Ligamentum flavum hypertrophy partially calcified greater on the right. Disc osteophyte complex. Severe right-sided and moderate to marked left-sided spinal stenosis and lateral recess stenosis. Mild bilateral foraminal narrowing.  L3-4: Facet joint degenerative changes and bony overgrowth greater on the right. Disc osteophyte complex greater right paracentral position. Marked right-sided and moderate left-sided lateral recess stenosis and spinal stenosis. Foraminal extension of disc osteophyte on the right with moderate foraminal narrowing and encroachment upon the exiting right L3 nerve root. Mild to slightly moderate left foraminal narrowing.  L4-5: Prominent bilateral facet joint degenerative changes. Ligamentum flavum hypertrophy. Prior laminectomy. Bulge greater right foraminal position. Moderate to marked right-sided and moderate left foraminal narrowing. Small amount a gas within the narrowed left neural foramen may represent gas containing synovial cyst. Difficult to evaluate the thecal sac without intrathecal contrast given the degree of significant postoperative changes.  L5-S1: Facet joint degenerative changes. Bulge with osteophyte and foraminal extension contributing to moderate foraminal narrowing greater on the left. Post laminectomy. Difficult to evaluate the thecal sac without into thecal contrast.  IMPRESSION: Summary of pertinent findings includes:  T12-L1 multifactorial moderate narrowing of the thecal sac greater on the right. Foraminal narrowing  greater on the right.  L1-2 multifactorial severe right foraminal narrowing and moderate left foraminal narrowing. Moderate  spinal stenosis greater on the right.  L2-3 multifactorial severe right-sided and moderate to marked left-sided spinal stenosis and lateral recess stenosis. Mild bilateral foraminal narrowing.  L3-4 multifactorial marked right-sided and moderate left-sided lateral recess stenosis and spinal stenosis. Multifactorial moderate right foraminal narrowing and encroachment upon the exiting right L3 nerve root. Mild to slightly moderate left foraminal narrowing.  L4-5 multifactorial moderate to marked right-sided and moderate left foraminal narrowing. Small amount a gas within the narrowed left neural foramen may represent gas containing synovial cyst. Post laminectomy. Difficult to evaluate the thecal sac without intrathecal contrast given the degree of significant postoperative changes.  L5-S1 multifactorial moderate foraminal narrowing greater on the left. Post laminectomy. Difficult to evaluate the thecal sac without into thecal contrast.  Please see above for further detail.   Electronically Signed   By: Chauncey Cruel M.D.   On: 03/31/2014 21:50    Oren Binet, MD  [facility] Pager:[phone]  If 7PM-7AM, please contact night-coverage [email or web address] Password [email or web address] 04/01/2014, 12:41 PM   LOS: 2 days

## 2014-04-01 NOTE — Progress Notes (Signed)
Physical Therapy Treatment Patient Details Name: James Parsons MRN: [id number] DOB: 11/11/1945 Today's Date: 04/01/2014    History of Present Illness Patient is a 68 yo male admitted 03/30/14 with sepsis/UTI, weakness, s/p fall.  Patient with pain in Rt hand and LLE behind knee.  X-rays reveal no fractures per reports.  PMH:  chronic pain with intrathecal pump and spinal cord stimulator, RSD, HTN, DM, COPD, depression, cervical fusion, laminectomy, Rt TKA.    PT Comments    Pt progressing towards physical therapy goals. Pt was able to ambulate length of the hallway and back to his room (480 feet). Noted R knee buckling throughout gait training however did not require assistance and pt could recover independently. Pt states that his wrist pain improves daily, however he continues to feel limited by pain. Will continue to follow and progress as able per POC.   Follow Up Recommendations  Home health PT;Supervision for mobility/OOB     Equipment Recommendations  None recommended by PT    Recommendations for Other Services       Precautions / Restrictions Precautions Precautions: Fall Precaution Comments: Multiple falls at home Restrictions Weight Bearing Restrictions: No    Mobility  Bed Mobility Overal bed mobility: Needs Assistance Bed Mobility: Supine to Sit     Supine to sit: Supervision     General bed mobility comments: Increased time to complete transfer to EOB. Pt was limited with R hand use due to wrist pain however was able to transition to full sitting with bed rail use.   Transfers Overall transfer level: Needs assistance Equipment used: Rolling walker (2 wheeled) Transfers: Sit to/from Stand Sit to Stand: Min guard         General transfer comment: Pt was able to power-up to full standing position without assistance. Somewhat impulsive at times, did not want to wait for therapist.   Ambulation/Gait Ambulation/Gait assistance: Min guard Ambulation  Distance (Feet): 480 Feet Assistive device: Rolling walker (2 wheeled) Gait Pattern/deviations: Step-through pattern;Decreased stride length;Trunk flexed;Antalgic Gait velocity: Decreased Gait velocity interpretation: Below normal speed for age/gender General Gait Details: Pt pushed RW mainly with LUE and was resting his R hand on the walker due to pain in the R wrist. Pt was not limited in distance, however noted multiple small knee buckles throughout gait training. Pt never required any assistance during these times, however close guard was provided for safety.    Stairs            Wheelchair Mobility    Modified Rankin (Stroke Patients Only)       Balance Overall balance assessment: Needs assistance Sitting-balance support: Feet supported;No upper extremity supported Sitting balance-Leahy Scale: Fair     Standing balance support: Bilateral upper extremity supported Standing balance-Leahy Scale: Poor Standing balance comment: Pt requires UE support.                     Cognition Arousal/Alertness: Awake/alert Behavior During Therapy: WFL for tasks assessed/performed Overall Cognitive Status: Within Functional Limits for tasks assessed                      Exercises      General Comments        Pertinent Vitals/Pain Pain Assessment: Faces Faces Pain Scale: Hurts a little bit Pain Location: Rt wrist, back due to spinal stimulator Pain Intervention(s): Monitored during session;Limited activity within patient's tolerance    Home Living  Prior Function            PT Goals (current goals can now be found in the care plan section) Acute Rehab PT Goals Patient Stated Goal: To go home in a few days PT Goal Formulation: With patient/family Time For Goal Achievement: 04/07/14 Potential to Achieve Goals: Good Progress towards PT goals: Progressing toward goals    Frequency  Min 3X/week    PT Plan Discharge plan  needs to be updated    Co-evaluation             End of Session Equipment Utilized During Treatment: Gait belt Activity Tolerance: Patient tolerated treatment well Patient left: in bed;with call bell/phone within reach;with nursing/sitter in room     Time: 1451-1515 PT Time Calculation (min) (ACUTE ONLY): 24 min  Charges:  $Gait Training: 8-22 mins $Therapeutic Activity: 8-22 mins                    G Codes:      Rolinda Roan Apr 11, 2014, 3:26 PM   Rolinda Roan, PT, DPT Acute Rehabilitation Services Pager: [phone]

## 2014-04-01 NOTE — Consult Note (Signed)
NAMELINDEN, TAGLIAFERRO NO.:  [id number]  MEDICAL RECORD NO.:  [id number]  LOCATION:  6E21C                        FACILITY:  [facility]  PHYSICIAN:  Dahlia Bailiff, MD    DATE OF BIRTH:  24-Dec-1945  DATE OF CONSULTATION:  03/31/2014 DATE OF DISCHARGE:                                CONSULTATION   Called to see this patient by the hospitalist concerning increasing left leg pain.  The patient is well known to me.  He had a recent spinal cord battery exchange as well as lumbar decompression.  The patient had his surgery done in September 2015.  At that time, he had a lumbar decompression from L4-S1 with foraminotomy and facetectomy right side and then I removed his nonfunctioning spinal cord stimulator and replaced it.  The patient was doing excellent postoperatively in fact he was recently seen by me in the office.  He was admitted on March 30, 2014, after being seen in the emergency department.  He was admitted for urinary tract infection and probable urosepsis.  The patient started noticing increasing left leg pain and increasing weakness in the left lower extremity as well as having increasing right wrist pain.  As a result of the back symptoms and his previous surgery, I was contacted to see the patient.  CLINICAL EXAMINATION:  The patient is alert.  He is oriented x3.  He complains of significant right forearm and wrist pain with range of motion to palpation.  No significant swelling in the lower extremity. He is grossly neurologically intact.  He can elevate the leg off of the bed in full extension.  He has good quad and hamstring function, EHL, tibialis anterior, gastrocnemius is 5/5 bilaterally.  He has a known significant scar tissue and right knee pain which limits his muscle testing because of pain on the right side.  He does have left-sided pain but no focal sensory or motor deficits.  He has very little back pain compared to his baseline chronic  level of pain.  He denies any incontinence of bowel and bladder.  At this point in time, I have recommended a CT scan of his lumbar spine just to check his decompression and to ensure there was no new disk pathology.  Because of the spinal cord stimulator, an MRI cannot be done.  I do not see any evidence of acute cauda equina syndrome that would warrant emergent surgical revision decompression.  I will be happy to continue to monitor him.     Dahlia Bailiff, MD     DDB/MEDQ  D:  03/31/2014  T:  04/01/2014  Job:  [id number]

## 2014-04-01 NOTE — Progress Notes (Signed)
Inpatient Diabetes Program Recommendations  AACE/ADA: New Consensus Statement on Inpatient Glycemic Control (2013)  Target Ranges:  Prepandial:   less than 140 mg/dL      Peak postprandial:   less than 180 mg/dL (1-2 hours)      Critically ill patients:  140 - 180 mg/dL   Reason for Assessment:  Results for LENWOOD, BALSAM (MRN [id number]) as of 04/01/2014 12:29  Ref. Range 03/31/2014 04:09 03/31/2014 12:12 03/31/2014 17:55 03/31/2014 23:58 04/01/2014 05:56  Glucose-Capillary Latest Range: 70-99 mg/dL 270 (H) 247 (H) 330 (H) 218 (H) 231 (H)    Please consider increasing Lantus to 20 units daily and change Novolog correction schedule to tid with meals and HS.   Text page sent to Dr. Sloan Leiter.  Thanks, Adah Perl, RN, BC-ADM Inpatient Diabetes Coordinator Pager [phone]

## 2014-04-02 LAB — CBC
HCT: 32.6 % — ABNORMAL LOW (ref 39.0–52.0)
Hemoglobin: 10.6 g/dL — ABNORMAL LOW (ref 13.0–17.0)
MCH: 27 pg (ref 26.0–34.0)
MCHC: 32.5 g/dL (ref 30.0–36.0)
MCV: 83 fL (ref 78.0–100.0)
PLATELETS: 236 10*3/uL (ref 150–400)
RBC: 3.93 MIL/uL — AB (ref 4.22–5.81)
RDW: 15.2 % (ref 11.5–15.5)
WBC: 9.1 10*3/uL (ref 4.0–10.5)

## 2014-04-02 LAB — BASIC METABOLIC PANEL
ANION GAP: 10 (ref 5–15)
BUN: 8 mg/dL (ref 6–23)
CO2: 25 mmol/L (ref 19–32)
Calcium: 8.9 mg/dL (ref 8.4–10.5)
Chloride: 102 mEq/L (ref 96–112)
Creatinine, Ser: 0.6 mg/dL (ref 0.50–1.35)
GFR calc non Af Amer: >90 mL/min (ref >90)
Glucose, Bld: 233 mg/dL — ABNORMAL HIGH (ref 70–99)
Potassium: 3.8 mmol/L (ref 3.5–5.1)
Sodium: 137 mmol/L (ref 135–145)

## 2014-04-02 LAB — URINE CULTURE: Colony Count: >=100000

## 2014-04-02 LAB — GLUCOSE, CAPILLARY
GLUCOSE-CAPILLARY: 212 mg/dL — AB (ref 70–99)
GLUCOSE-CAPILLARY: 238 mg/dL — AB (ref 70–99)
Glucose-Capillary: 289 mg/dL — ABNORMAL HIGH (ref 70–99)

## 2014-04-02 MED ORDER — CIPROFLOXACIN HCL 500 MG PO TABS
500.0000 mg | ORAL_TABLET | Freq: Two times a day (BID) | ORAL | Status: DC
  Filled 2014-04-02 (×2): qty 1

## 2014-04-02 MED ORDER — CIPROFLOXACIN HCL 500 MG PO TABS: 500.0000 mg | ORAL_TABLET | Freq: Two times a day (BID) | ORAL | Status: DC

## 2014-04-02 NOTE — Discharge Summary (Signed)
Physician Discharge Summary  James Parsons ERX:[id number] DOB: 1945/07/31 DOA: 03/30/2014  PCP: Alesia Richards, MD  Admit date: 03/30/2014 Discharge date: 04/02/2014  Time spent: > 35 minutes  Recommendations for Outpatient Follow-up:  1.  Patient is to follow-up with orthopedic surgeon for further evaluation recommendations 2. We will discharge with 5 more days of antibiotics to complete a 9 day treatment course  Discharge Diagnoses:   Discharge Condition: Stable  Diet recommendation: Diabetic  Filed Weights   03/30/14 2243 03/31/14 2005 04/01/14 1958  Weight: 100.245 kg (221 lb) 97 kg (213 lb 13.5 oz) 97.07 kg (214 lb)    History of present illness:  From original history of present illness James Parsons is a 68 y.o. 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 Patient presented with complaints of a fall. He mentions that since last 3 days he has been having generalized weakness with left leg causing significant pain as well and giving out on him. Today while he was on the bad he tried to calm out of the bed with and lost his balance and fell on the ground.   Hospital Course:  Principal Problem:   Sepsis -Improved with IV antibiotics - Most likely secondary to UTI. Escherichia coli a source  UTI - Urine culture sensitivities showing Escherichia coli which is pansensitive - Will discharge on Cipro for 5 more days of antibiotics  Active Problems:   Essential hypertension - Stable on clonidine will continue on discharge    Chronic pain associated with significant psychosocial dysfunction - Patient to continue home regimen - PT evaluation recommends no PT follow-up    History of lumbar  laminectomy for spinal cord decompression - Recommend patient follow-up with orthopedic surgeon. On last note and evaluation transfer emergency surgery    Right hand pain - Thumb spica splint recommended and follow-up with orthopedic surgeon   Procedures:  Please see below  Consultations:  Orthopedic hand surgeon and general orthopedic  Discharge Exam: Filed Vitals:   04/02/14 1000  BP: 124/57  Pulse: 89  Temp: 97.8 F (36.6 C)  Resp:     General: Patient in no acute distress, alert and awake Cardiovascular: Rate and rhythm, no murmurs rubs Respiratory: *Equal chest rise equal, clear to auscultation bilaterally  Discharge Instructions   Discharge Instructions    Call MD for:  difficulty breathing, headache or visual disturbances    Complete by:  As directed      Call MD for:  temperature >100.4    Complete by:  As directed      Diet - low sodium heart healthy    Complete by:  As directed      Discharge instructions    Complete by:  As directed   Please follow up with your orthopaedic surgeon in 1-2 weeks or sooner should any new concerns arise.     Increase activity slowly    Complete by:  As directed           Current Discharge Medication List    START taking these medications   Details  ciprofloxacin (CIPRO) 500 MG tablet Take 1 tablet (500 mg total) by mouth 2 (two) times daily. Qty: 10 tablet, Refills: 0      CONTINUE these medications which have NOT CHANGED  Details  celecoxib (CELEBREX) 200 MG capsule Take 200 mg by mouth 2 (two) times daily. Refills: 2    Cholecalciferol (VITAMIN D-3) 5000 UNITS TABS Take 5,000 Units by mouth 2 (two) times daily.    cloNIDine (CATAPRES) 0.1 MG tablet Take 0.1 mg by mouth 2 (two) times daily.    cyclobenzaprine (FLEXERIL) 10 MG tablet Take 10 mg by mouth 3 (three) times daily as needed for muscle spasms. Muscle spasm.    DULoxetine (CYMBALTA) 60 MG capsule Take 60 mg by mouth daily.     fentaNYL 0.05 MG/ML  SOLN 5,000 mcg Inject into the skin continuous. Patient will bring dosage instructions with him. Daily dose 0.84mg     glimepiride (AMARYL) 4 MG tablet Take 4 mg by mouth daily with breakfast.  Refills: 0    lidocaine (LIDODERM) 5 % Place 1 patch onto the skin daily.  Refills: 0    LYRICA 150 MG capsule Take 150 mg by mouth 2 (two) times daily.     metFORMIN (GLUCOPHAGE) 500 MG tablet Take 1,000 mg by mouth 2 (two) times daily with a meal.    simvastatin (ZOCOR) 40 MG tablet Take 40 mg by mouth daily.    sitaGLIPtin (JANUVIA) 100 MG tablet Take 50 mg by mouth daily.    tamsulosin (FLOMAX) 0.4 MG CAPS capsule Take 0.4 mg by mouth daily.     traMADol (ULTRAM) 50 MG tablet Take 50 mg by mouth 3 (three) times daily as needed. pain      STOP taking these medications     ENDOCET 10-325 MG per tablet        Allergies  Allergen Reactions  . Codeine Itching  . Morphine And Related Other (See Comments)    hallucinations      The results of significant diagnostics from this hospitalization (including imaging, microbiology, ancillary and laboratory) are listed below for reference.    Significant Diagnostic Studies: Dg Chest 2 View  03/31/2014   CLINICAL DATA:  Generalized weakness  EXAM: CHEST  2 VIEW  COMPARISON:  10/16/2013  FINDINGS: The heart size at upper limits of normal. Spinal stimulator noted. Both lungs are clear. The visualized skeletal structures are unremarkable but subjectively osteopenic.  IMPRESSION: No active cardiopulmonary disease.   Electronically Signed   By: Conchita Paris M.D.   On: 03/31/2014 00:45   Dg Wrist Complete Right  03/31/2014   CLINICAL DATA:  Acute right lateral wrist pain and swelling since last night without trauma  EXAM: RIGHT WRIST - COMPLETE 3+ VIEW  COMPARISON:  None.  FINDINGS: There is no evidence of fracture or dislocation. There is no evidence of arthropathy or other focal bone abnormality. Soft tissues are unremarkable. Scapholunate  distance at upper limits of normal measuring 3 mm. Degenerative change noted at the first carpometacarpal joint.  IMPRESSION: No acute osseous abnormality.  Scapholunate distance of 3 mm at upper limits of normal. Correlate for point tenderness to determine chronicity.   Electronically Signed   By: Conchita Paris M.D.   On: 03/31/2014 00:44   Dg Knee 1-2 Views Left  03/31/2014   CLINICAL DATA:  Medial left knee pain.  Knee gave out 2 days ago.  EXAM: LEFT KNEE - 1-2 VIEW  COMPARISON:  None.  FINDINGS: No acute fracture or dislocation is identified. There is a small knee joint effusion. Medial and lateral compartment joint space widths appear preserved. No lytic or blastic osseous lesion is identified. No radiopaque foreign body.  IMPRESSION: Small knee joint effusion.  No acute osseous abnormality identified.   Electronically Signed   By: Logan Bores   On: 03/31/2014 08:28   Ct Head Wo Contrast  03/31/2014   CLINICAL DATA:  Dizziness, fall  EXAM: CT HEAD WITHOUT CONTRAST  TECHNIQUE: Contiguous axial images were obtained from the base of the skull through the vertex without intravenous contrast.  COMPARISON:  10/16/2013  FINDINGS: Mild cortical volume loss noted with proportional ventricular prominence. Areas of periventricular white matter hypodensity are most compatible with small vessel ischemic change. No acute hemorrhage, infarct, or mass lesion is identified. No midline shift. No acute osseous abnormality. Orbits and paranasal sinuses are unremarkable.  IMPRESSION: No acute intracranial finding.   Electronically Signed   By: Conchita Paris M.D.   On: 03/31/2014 00:57   Ct Lumbar Spine Wo Contrast  03/31/2014   CLINICAL DATA:  68 year old male post fall yesterday with left leg pain. Spine stimulator device in place and unable to have MR. Initial encounter.  EXAM: CT LUMBAR SPINE WITHOUT CONTRAST  TECHNIQUE: Multidetector CT imaging of the lumbar spine was performed without intravenous contrast  administration. Multiplanar CT image reconstructions were also generated.  COMPARISON:  Intraoperative lateral view of the lumbar spine 01/08/2014. Postmyelogram CT 09/04/2013.  FINDINGS: Last fully open disk space is labeled L5-S1. Present examination incorporates from T12-L1 disc space through the lower sacrum.  Atherosclerotic type changes of the abdominal aorta and involving branch vessels. No focal abdominal aortic aneurysm. Mildly ectatic iliac arteries.  Stimulating device leads at the T12-L1, L1-2 and L2-3 disc space level directed superiorly similar to prior exam.  No acute fracture detected.  T12-L1: Facet joint degenerative changes and bony overgrowth greater on the right. Stimulating device leads is in place. Narrowing of the thecal sac greater on the right. Foraminal narrowing greater on the right.  L1-2: Facet joint degenerative changes and bony overgrowth greater on the right. Superior aspect of the L2 facet extends into the neural foramen greater on right with severe right foraminal narrowing and moderate left foraminal narrowing. Disc osteophyte complex greater right lateral position. Stimulating device in place. Moderate spinal stenosis greater on the right.  L2-3: Facet joint degenerative changes and bony overgrowth. Ligamentum flavum hypertrophy partially calcified greater on the right. Disc osteophyte complex. Severe right-sided and moderate to marked left-sided spinal stenosis and lateral recess stenosis. Mild bilateral foraminal narrowing.  L3-4: Facet joint degenerative changes and bony overgrowth greater on the right. Disc osteophyte complex greater right paracentral position. Marked right-sided and moderate left-sided lateral recess stenosis and spinal stenosis. Foraminal extension of disc osteophyte on the right with moderate foraminal narrowing and encroachment upon the exiting right L3 nerve root. Mild to slightly moderate left foraminal narrowing.  L4-5: Prominent bilateral facet joint  degenerative changes. Ligamentum flavum hypertrophy. Prior laminectomy. Bulge greater right foraminal position. Moderate to marked right-sided and moderate left foraminal narrowing. Small amount a gas within the narrowed left neural foramen may represent gas containing synovial cyst. Difficult to evaluate the thecal sac without intrathecal contrast given the degree of significant postoperative changes.  L5-S1: Facet joint degenerative changes. Bulge with osteophyte and foraminal extension contributing to moderate foraminal narrowing greater on the left. Post laminectomy. Difficult to evaluate the thecal sac without into thecal contrast.  IMPRESSION: Summary of pertinent findings includes:  T12-L1 multifactorial moderate narrowing of the thecal sac greater on the right. Foraminal narrowing greater on the right.  L1-2 multifactorial severe right foraminal narrowing and moderate left foraminal narrowing. Moderate spinal stenosis greater on  the right.  L2-3 multifactorial severe right-sided and moderate to marked left-sided spinal stenosis and lateral recess stenosis. Mild bilateral foraminal narrowing.  L3-4 multifactorial marked right-sided and moderate left-sided lateral recess stenosis and spinal stenosis. Multifactorial moderate right foraminal narrowing and encroachment upon the exiting right L3 nerve root. Mild to slightly moderate left foraminal narrowing.  L4-5 multifactorial moderate to marked right-sided and moderate left foraminal narrowing. Small amount a gas within the narrowed left neural foramen may represent gas containing synovial cyst. Post laminectomy. Difficult to evaluate the thecal sac without intrathecal contrast given the degree of significant postoperative changes.  L5-S1 multifactorial moderate foraminal narrowing greater on the left. Post laminectomy. Difficult to evaluate the thecal sac without into thecal contrast.  Please see above for further detail.   Electronically Signed   By: Chauncey Cruel M.D.   On: 03/31/2014 21:50    Microbiology: Recent Results (from the past 240 hour(s))  Urine culture     Status: None   Collection Time: 03/30/14 10:47 PM  Result Value Ref Range Status   Specimen Description URINE, CLEAN CATCH  Final   Special Requests NONE  Final   Culture  Setup Time   Final    03/31/2014 10:20 Performed at [facility]   Final    >=100,000 COLONIES/ML Performed at [place] Performed at [facility]    Report Status 04/02/2014 FINAL  Final   Organism ID, Bacteria ESCHERICHIA COLI  Final      Susceptibility   Escherichia coli - MIC*    AMPICILLIN <=2 SENSITIVE Sensitive     CEFAZOLIN <=4 SENSITIVE Sensitive     CEFTRIAXONE <=1 SENSITIVE Sensitive     CIPROFLOXACIN <=0.25 SENSITIVE Sensitive     GENTAMICIN <=1 SENSITIVE Sensitive     LEVOFLOXACIN <=0.12 SENSITIVE Sensitive     NITROFURANTOIN <=16 SENSITIVE Sensitive     TOBRAMYCIN <=1 SENSITIVE Sensitive     TRIMETH/SULFA <=20 SENSITIVE Sensitive     PIP/TAZO <=4 SENSITIVE Sensitive     * ESCHERICHIA COLI  Culture, blood (routine x 2)     Status: None (Preliminary result)   Collection Time: 03/31/14 12:06 AM  Result Value Ref Range Status   Specimen Description BLOOD LEFT ANTECUBITAL  Final   Special Requests BOTTLES DRAWN AEROBIC AND ANAEROBIC 5CC EA  Final   Culture  Setup Time   Final    03/31/2014 10:18 Performed at [facility]    Culture   Final           BLOOD CULTURE RECEIVED NO GROWTH TO DATE CULTURE WILL BE HELD FOR 5 DAYS BEFORE ISSUING A FINAL NEGATIVE REPORT Performed at [facility]    Report Status PENDING  Incomplete  Culture, blood (routine x 2)     Status: None (Preliminary result)   Collection Time: 03/31/14 12:12 AM  Result Value Ref Range Status   Specimen Description BLOOD LEFT WRIST  Final   Special Requests BOTTLES DRAWN AEROBIC ONLY 8CC  Final   Culture  Setup  Time   Final    03/31/2014 10:18 Performed at [facility]    Culture   Final           BLOOD CULTURE RECEIVED NO GROWTH TO DATE CULTURE WILL BE HELD FOR 5 DAYS BEFORE ISSUING A FINAL NEGATIVE REPORT Performed at [facility]    Report Status  PENDING  Incomplete     Labs: Basic Metabolic Panel:  Recent Labs Lab 03/30/14 2244 03/31/14 0510 04/02/14 0500  NA 130* 135* 137  K 4.3 4.4 3.8  CL 91* 96 102  CO2 22 26 25   GLUCOSE 379* 277* 233*  BUN 15 14 8   CREATININE 0.59 0.62 0.60  CALCIUM 10.5 9.7 8.9   Liver Function Tests:  Recent Labs Lab 03/30/14 2244 03/31/14 0510  AST 20 23  ALT 29 25  ALKPHOS 132* 117  BILITOT 0.7 0.8  PROT 8.1 7.3  ALBUMIN 3.3* 2.9*   No results for input(s): LIPASE, AMYLASE in the last 168 hours. No results for input(s): AMMONIA in the last 168 hours. CBC:  Recent Labs Lab 03/30/14 2244 03/31/14 0510 04/02/14 0500  WBC 12.7* 9.9 9.1  HGB 13.0 11.7* 10.6*  HCT 38.3* 35.7* 32.6*  MCV 82.7 83.0 83.0  PLT 236 207 236   Cardiac Enzymes:  Recent Labs Lab 03/31/14 0510  TROPONINI <0.30   BNP: BNP (last 3 results) No results for input(s): PROBNP in the last 8760 hours. CBG:  Recent Labs Lab 04/01/14 1206 04/01/14 1720 04/02/14 0004 04/02/14 0524 04/02/14 1202  GLUCAP 282* 315* 238* 212* 289*       Signed:  Velvet Bathe  [facility] 04/02/2014, 4:35 PM

## 2014-04-02 NOTE — Care Management Note (Signed)
CARE MANAGEMENT NOTE 04/02/2014  Patient:  James Parsons, James Parsons   Account Number:  [id number]  Date Initiated:  04/02/2014  Documentation initiated by:  Travis Purk  Subjective/Objective Assessment:   CM following for progression and d/c planning.     Action/Plan:   04/02/2014 Met with pt , IM given and explained. Pt comfortable this am.   Anticipated DC Date:  04/03/2014   Anticipated DC Plan:  HOME/SELF CARE         Choice offered to / List presented to:             Status of service:  In process, will continue to follow Medicare Important Message given?  YES (If response is "NO", the following Medicare IM given date fields will be blank) Date Medicare IM given:  04/02/2014 Medicare IM given by:  Zakari Bathe Date Additional Medicare IM given:   Additional Medicare IM given by:    Discharge Disposition:  HOME/SELF CARE  Per UR Regulation:    If discussed at Long Length of Stay Meetings, dates discussed:    Comments:

## 2014-04-02 NOTE — Progress Notes (Signed)
High fall risk

## 2014-04-02 NOTE — Progress Notes (Signed)
Inpatient Diabetes Program Recommendations  AACE/ADA: New Consensus Statement on Inpatient Glycemic Control (2013)  Target Ranges:  Prepandial:   less than 140 mg/dL      Peak postprandial:   less than 180 mg/dL (1-2 hours)      Critically ill patients:  140 - 180 mg/dL   Reason for Visit: Results for James Parsons, James Parsons (MRN [id number]) as of 04/02/2014 09:51  Ref. Range 04/01/2014 05:56 04/01/2014 12:06 04/01/2014 17:20 04/02/2014 00:04 04/02/2014 05:24  Glucose-Capillary Latest Range: 70-99 mg/dL 231 (H) 282 (H) 315 (H) 238 (H) 212 (H)   Please consider increasing Lantus to 25 units q HS.  Also please change Novolog correction to tid with meals and HS instead of q 6 hours.  May also benefit from the addition of Novolog meal coverage 4 units tid with meals (Hold if patient eats less than 50%).    Thanks, Adah Perl, RN, BC-ADM Inpatient Diabetes Coordinator Pager [phone]

## 2014-04-06 LAB — CULTURE, BLOOD (ROUTINE X 2)
Culture: NO GROWTH
Culture: NO GROWTH

## 2014-04-07 ENCOUNTER — Observation Stay (HOSPITAL_COMMUNITY)
Admission: EM | Admit: 2014-04-07 | Discharge: 2014-04-09 | Disposition: A | Discharge status: Skilled Nursing Facility | Payer: Medicare PPO | Attending: Internal Medicine | Admitting: Internal Medicine

## 2014-04-07 ENCOUNTER — Emergency Department (HOSPITAL_COMMUNITY): Payer: Medicare PPO

## 2014-04-07 ENCOUNTER — Encounter (HOSPITAL_COMMUNITY): Payer: Self-pay | Admitting: Emergency Medicine

## 2014-04-07 DIAGNOSIS — I10 Essential (primary) hypertension: Secondary | ICD-10-CM | POA: Diagnosis not present

## 2014-04-07 DIAGNOSIS — Z885 Allergy status to narcotic agent status: Secondary | ICD-10-CM | POA: Diagnosis not present

## 2014-04-07 DIAGNOSIS — R531 Weakness: Secondary | ICD-10-CM

## 2014-04-07 DIAGNOSIS — J449 Chronic obstructive pulmonary disease, unspecified: Principal | ICD-10-CM | POA: Insufficient documentation

## 2014-04-07 DIAGNOSIS — M1990 Unspecified osteoarthritis, unspecified site: Secondary | ICD-10-CM | POA: Insufficient documentation

## 2014-04-07 DIAGNOSIS — T402X5A Adverse effect of other opioids, initial encounter: Secondary | ICD-10-CM | POA: Insufficient documentation

## 2014-04-07 DIAGNOSIS — G8929 Other chronic pain: Secondary | ICD-10-CM | POA: Diagnosis not present

## 2014-04-07 DIAGNOSIS — E1165 Type 2 diabetes mellitus with hyperglycemia: Secondary | ICD-10-CM | POA: Insufficient documentation

## 2014-04-07 DIAGNOSIS — M25551 Pain in right hip: Secondary | ICD-10-CM | POA: Insufficient documentation

## 2014-04-07 DIAGNOSIS — Z6832 Body mass index (BMI) 32.0-32.9, adult: Secondary | ICD-10-CM | POA: Insufficient documentation

## 2014-04-07 DIAGNOSIS — Z794 Long term (current) use of insulin: Secondary | ICD-10-CM | POA: Diagnosis not present

## 2014-04-07 DIAGNOSIS — Z87440 Personal history of urinary (tract) infections: Secondary | ICD-10-CM | POA: Insufficient documentation

## 2014-04-07 DIAGNOSIS — J42 Unspecified chronic bronchitis: Secondary | ICD-10-CM

## 2014-04-07 DIAGNOSIS — E663 Overweight: Secondary | ICD-10-CM | POA: Diagnosis present

## 2014-04-07 DIAGNOSIS — E669 Obesity, unspecified: Secondary | ICD-10-CM | POA: Diagnosis not present

## 2014-04-07 DIAGNOSIS — R627 Adult failure to thrive: Secondary | ICD-10-CM | POA: Insufficient documentation

## 2014-04-07 DIAGNOSIS — G9050 Complex regional pain syndrome I, unspecified: Secondary | ICD-10-CM | POA: Insufficient documentation

## 2014-04-07 DIAGNOSIS — K5909 Other constipation: Secondary | ICD-10-CM | POA: Diagnosis not present

## 2014-04-07 DIAGNOSIS — Z86010 Personal history of colonic polyps: Secondary | ICD-10-CM | POA: Diagnosis not present

## 2014-04-07 DIAGNOSIS — I872 Venous insufficiency (chronic) (peripheral): Secondary | ICD-10-CM | POA: Diagnosis not present

## 2014-04-07 DIAGNOSIS — M25531 Pain in right wrist: Secondary | ICD-10-CM | POA: Insufficient documentation

## 2014-04-07 DIAGNOSIS — E785 Hyperlipidemia, unspecified: Secondary | ICD-10-CM | POA: Diagnosis not present

## 2014-04-07 DIAGNOSIS — F329 Major depressive disorder, single episode, unspecified: Secondary | ICD-10-CM | POA: Diagnosis not present

## 2014-04-07 DIAGNOSIS — IMO0002 Reserved for concepts with insufficient information to code with codable children: Secondary | ICD-10-CM

## 2014-04-07 DIAGNOSIS — W19XXXA Unspecified fall, initial encounter: Secondary | ICD-10-CM

## 2014-04-07 LAB — URINALYSIS, ROUTINE W REFLEX MICROSCOPIC
BILIRUBIN URINE: NEGATIVE
Glucose, UA: >1000 mg/dL — AB
HGB URINE DIPSTICK: NEGATIVE
Ketones, ur: 15 mg/dL — AB
Leukocytes, UA: NEGATIVE
NITRITE: NEGATIVE
PH: 5.5 (ref 5.0–8.0)
Protein, ur: NEGATIVE mg/dL
Specific Gravity, Urine: 1.025 (ref 1.005–1.030)
Urobilinogen, UA: 0.2 mg/dL (ref 0.0–1.0)

## 2014-04-07 LAB — CBC WITH DIFFERENTIAL/PLATELET
BASOS ABS: 0 10*3/uL (ref 0.0–0.1)
BASOS PCT: 0 % (ref 0–1)
EOS ABS: 0.1 10*3/uL (ref 0.0–0.7)
Eosinophils Relative: 1 % (ref 0–5)
HCT: 33.4 % — ABNORMAL LOW (ref 39.0–52.0)
Hemoglobin: 11.3 g/dL — ABNORMAL LOW (ref 13.0–17.0)
Lymphocytes Relative: 20 % (ref 12–46)
Lymphs Abs: 2.3 10*3/uL (ref 0.7–4.0)
MCH: 28.3 pg (ref 26.0–34.0)
MCHC: 33.8 g/dL (ref 30.0–36.0)
MCV: 83.5 fL (ref 78.0–100.0)
Monocytes Absolute: 0.8 10*3/uL (ref 0.1–1.0)
Monocytes Relative: 7 % (ref 3–12)
NEUTROS PCT: 73 % (ref 43–77)
Neutro Abs: 8.5 10*3/uL — ABNORMAL HIGH (ref 1.7–7.7)
PLATELETS: 401 10*3/uL — AB (ref 150–400)
RBC: 4 MIL/uL — ABNORMAL LOW (ref 4.22–5.81)
RDW: 15 % (ref 11.5–15.5)
WBC: 11.6 10*3/uL — ABNORMAL HIGH (ref 4.0–10.5)

## 2014-04-07 LAB — URINE MICROSCOPIC-ADD ON

## 2014-04-07 LAB — COMPREHENSIVE METABOLIC PANEL
ALBUMIN: 2.6 g/dL — AB (ref 3.5–5.2)
ALT: 34 U/L (ref 0–53)
ANION GAP: 11 (ref 5–15)
AST: 25 U/L (ref 0–37)
Alkaline Phosphatase: 131 U/L — ABNORMAL HIGH (ref 39–117)
BUN: 9 mg/dL (ref 6–23)
CO2: 28 mmol/L (ref 19–32)
Calcium: 9.1 mg/dL (ref 8.4–10.5)
Chloride: 93 mEq/L — ABNORMAL LOW (ref 96–112)
Creatinine, Ser: 0.55 mg/dL (ref 0.50–1.35)
GFR calc Af Amer: >90 mL/min (ref >90)
GFR calc non Af Amer: >90 mL/min (ref >90)
Glucose, Bld: 301 mg/dL — ABNORMAL HIGH (ref 70–99)
POTASSIUM: 4.2 mmol/L (ref 3.5–5.1)
SODIUM: 132 mmol/L — AB (ref 135–145)
TOTAL PROTEIN: 7.9 g/dL (ref 6.0–8.3)
Total Bilirubin: 0.5 mg/dL (ref 0.3–1.2)

## 2014-04-07 LAB — GLUCOSE, CAPILLARY: Glucose-Capillary: 432 mg/dL — ABNORMAL HIGH (ref 70–99)

## 2014-04-07 MED ORDER — OXYCODONE-ACETAMINOPHEN 5-325 MG PO TABS: 2.0000 | ORAL_TABLET | Freq: Once | ORAL | Status: DC

## 2014-04-07 MED ORDER — ENOXAPARIN SODIUM 40 MG/0.4ML ~~LOC~~ SOLN
40.0000 mg | SUBCUTANEOUS | Status: DC
  Administered 2014-04-07 – 2014-04-08 (×2): 40 mg via SUBCUTANEOUS
  Filled 2014-04-07 (×3): qty 0.4

## 2014-04-07 MED ORDER — CELECOXIB 200 MG PO CAPS
200.0000 mg | ORAL_CAPSULE | Freq: Two times a day (BID) | ORAL | Status: DC
  Administered 2014-04-07 – 2014-04-09 (×4): 200 mg via ORAL
  Filled 2014-04-07 (×5): qty 1

## 2014-04-07 MED ORDER — FENTANYL CITRATE 0.05 MG/ML IJ SOLN
50.0000 ug | Freq: Once | INTRAMUSCULAR | Status: AC
  Administered 2014-04-07: 50 ug via INTRAVENOUS
  Filled 2014-04-07: qty 2

## 2014-04-07 MED ORDER — ACETAMINOPHEN 325 MG PO TABS: 650.0000 mg | ORAL_TABLET | Freq: Four times a day (QID) | ORAL | Status: DC | PRN

## 2014-04-07 MED ORDER — DULOXETINE HCL 60 MG PO CPEP
60.0000 mg | ORAL_CAPSULE | Freq: Every day | ORAL | Status: DC
  Administered 2014-04-07 – 2014-04-09 (×3): 60 mg via ORAL
  Filled 2014-04-07 (×3): qty 1

## 2014-04-07 MED ORDER — POLYETHYLENE GLYCOL 3350 17 G PO PACK
17.0000 g | PACK | Freq: Every day | ORAL | Status: DC
  Filled 2014-04-07 (×4): qty 1

## 2014-04-07 MED ORDER — SIMVASTATIN 40 MG PO TABS
40.0000 mg | ORAL_TABLET | Freq: Every day | ORAL | Status: DC
  Administered 2014-04-08 – 2014-04-09 (×2): 40 mg via ORAL
  Filled 2014-04-07 (×2): qty 1

## 2014-04-07 MED ORDER — ONDANSETRON HCL 4 MG/2ML IJ SOLN
4.0000 mg | Freq: Four times a day (QID) | INTRAMUSCULAR | Status: DC | PRN
Start: 2014-04-07 — End: 2014-04-09

## 2014-04-07 MED ORDER — ONDANSETRON HCL 4 MG PO TABS
4.0000 mg | ORAL_TABLET | Freq: Four times a day (QID) | ORAL | Status: DC | PRN
  Administered 2014-04-08: 4 mg via ORAL
  Filled 2014-04-07: qty 1

## 2014-04-07 MED ORDER — INSULIN GLARGINE 100 UNIT/ML ~~LOC~~ SOLN
5.0000 [IU] | Freq: Every day | SUBCUTANEOUS | Status: DC
  Administered 2014-04-07: 5 [IU] via SUBCUTANEOUS
  Filled 2014-04-07 (×3): qty 0.05

## 2014-04-07 MED ORDER — CLONIDINE HCL 0.1 MG PO TABS
0.1000 mg | ORAL_TABLET | Freq: Two times a day (BID) | ORAL | Status: DC
  Administered 2014-04-07 – 2014-04-09 (×4): 0.1 mg via ORAL
  Filled 2014-04-07 (×5): qty 1

## 2014-04-07 MED ORDER — LIDOCAINE 5 % EX PTCH
1.0000 | MEDICATED_PATCH | CUTANEOUS | Status: DC
  Administered 2014-04-07: 1 via TRANSDERMAL
  Filled 2014-04-07 (×3): qty 1

## 2014-04-07 MED ORDER — INSULIN ASPART 100 UNIT/ML ~~LOC~~ SOLN
0.0000 [IU] | Freq: Every day | SUBCUTANEOUS | Status: DC
  Administered 2014-04-08: 2 [IU] via SUBCUTANEOUS

## 2014-04-07 MED ORDER — PREGABALIN 75 MG PO CAPS
150.0000 mg | ORAL_CAPSULE | Freq: Two times a day (BID) | ORAL | Status: DC
  Administered 2014-04-07: 150 mg via ORAL
  Filled 2014-04-07: qty 2

## 2014-04-07 MED ORDER — DOCUSATE SODIUM 100 MG PO CAPS
100.0000 mg | ORAL_CAPSULE | Freq: Two times a day (BID) | ORAL | Status: DC
  Administered 2014-04-07 – 2014-04-08 (×2): 100 mg via ORAL
  Filled 2014-04-07 (×5): qty 1

## 2014-04-07 MED ORDER — SODIUM CHLORIDE 0.9 % IV BOLUS (SEPSIS)
1000.0000 mL | Freq: Once | INTRAVENOUS | Status: AC
  Administered 2014-04-07: 1000 mL via INTRAVENOUS

## 2014-04-07 MED ORDER — ACETAMINOPHEN 650 MG RE SUPP: 650.0000 mg | Freq: Four times a day (QID) | RECTAL | Status: DC | PRN

## 2014-04-07 MED ORDER — CYCLOBENZAPRINE HCL 10 MG PO TABS
10.0000 mg | ORAL_TABLET | Freq: Three times a day (TID) | ORAL | Status: DC | PRN
  Administered 2014-04-08 (×3): 10 mg via ORAL
  Filled 2014-04-07 (×3): qty 1

## 2014-04-07 MED ORDER — TAMSULOSIN HCL 0.4 MG PO CAPS
0.4000 mg | ORAL_CAPSULE | Freq: Every day | ORAL | Status: DC
  Administered 2014-04-07 – 2014-04-09 (×3): 0.4 mg via ORAL
  Filled 2014-04-07 (×3): qty 1

## 2014-04-07 MED ORDER — TRAMADOL HCL 50 MG PO TABS
50.0000 mg | ORAL_TABLET | Freq: Three times a day (TID) | ORAL | Status: DC | PRN
  Administered 2014-04-08 – 2014-04-09 (×4): 50 mg via ORAL
  Filled 2014-04-07 (×4): qty 1

## 2014-04-07 MED ORDER — INSULIN ASPART 100 UNIT/ML ~~LOC~~ SOLN
8.0000 [IU] | Freq: Once | SUBCUTANEOUS | Status: AC
  Administered 2014-04-07: 8 [IU] via SUBCUTANEOUS

## 2014-04-07 MED ORDER — INSULIN ASPART 100 UNIT/ML ~~LOC~~ SOLN
0.0000 [IU] | Freq: Three times a day (TID) | SUBCUTANEOUS | Status: DC
  Administered 2014-04-08: 20 [IU] via SUBCUTANEOUS
  Administered 2014-04-08: 15 [IU] via SUBCUTANEOUS
  Administered 2014-04-08: 11 [IU] via SUBCUTANEOUS
  Administered 2014-04-09: 15 [IU] via SUBCUTANEOUS
  Administered 2014-04-09: 7 [IU] via SUBCUTANEOUS
  Administered 2014-04-09: 15 [IU] via SUBCUTANEOUS

## 2014-04-07 NOTE — ED Notes (Signed)
Attempted to ambulate pt . Pt states "cant you feel my knees shaking". Pt was able to take two steps from bed then became weak and unable to walk further. Pt sat back on bed. MD made aware

## 2014-04-07 NOTE — ED Notes (Signed)
Assisted Lexine Baton, RN with readjusting pt on the stretcher to assist pt to a standing position then with 2 person assist to ambulate pt; pt was not able to ambulate with 2 person assist; myself and Lexine Baton, RN assisted pt to stretcher and readjusted on stretcher; wife at bedside

## 2014-04-07 NOTE — ED Notes (Signed)
Patient from home. Patient was recently discharged from this facility where he had a 3 day stay for a bowel infection. Since then he has had increased weakness, and fell on Friday. Patient denies hitting head and denies losing consciousness, but is complaining of right hip pain. No shortening or rotation noted. Patient is also complaining of bilateral upper extremity pain which he describes as pins and needles. Hx of chronic back ain in which he has a fentanyl pain pump for. A/o x4

## 2014-04-07 NOTE — ED Notes (Signed)
Gave pt wife a  Ginger ale.

## 2014-04-07 NOTE — H&P (Signed)
History and Physical  James Parsons TMH:[id number] DOB: [birth year] DOA: 04/07/2014  Referring physician: Artis Delay, ER physician PCP: Alesia Richards, MD   Chief Complaint: Hip pain and weakness  HPI: James Parsons is a 68 y.o. male  With past mental history of COPD, RSD with chronic pain and uncontrolled diabetes mellitus who was admitted last week following a fall which left him with weakness. Patient was to go home with home health, however due to scheduling issues possibly from holidays, home health was not able to meet the patient. Since that time was grown progressively weaker to the point where he could not even stand at this point. Patient then came to the emergency room he was evaluated and hospitals were called for further evaluation for possible short-term skilled nursing options   Review of Systems:  Patient seen after arrival to floor. 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  Pt denies any headaches, vision changes, dysphagia, chest pain, palpitations, shortness of breath, wheeze, cough, abdominal pain, hematuria, dysuria, diarrhea, focal extremity numbness or weakness or pain.  Review of systems are otherwise negative  Past Medical History  Diagnosis Date  . Anemia   . Arthritis   . Blood transfusion   . Clotting disorder   . Diabetes mellitus   . Borderline hypertension   . Venous insufficiency   . Hyperlipidemia   . Obesity   . Diverticulosis of colon   . Hx of colonic polyps   . Low back pain   . Lipoma   . RSD (reflex sympathetic dystrophy)   . Mental disorder   . Depression     hx of   . COPD (chronic obstructive pulmonary disease)     denies  . Pneumonia     hx   Past Surgical History  Procedure Laterality Date  . Back surgery  9211,9417  . Morphine pump  2009    due to reaction to morphine/changed to fentanyl  . Excision of lipoma from right olecranon  area  11/2000    Dr. Rise [name]  . Microdiscectomy and decompression  05/2002    Dr. Tonita Cong  . C3-4 anterior cervical discectomy and fusion with plating at c3-4  05/2006    Dr. Arnoldo Morale  . Spinal cord stimulator implanted      for pain per Dr. Maryruth Eve  . Subcut pain pump implanted    . Pain pump implantation      with fentanyl  . Tonsillectomy    . Colonoscopy w/ polypectomy    . Knee arthroscopy      right knee  . 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 Total knee arthroplasty  02/29/2012    Procedure: TOTAL KNEE ARTHROPLASTY;  Surgeon: Kerin Salen, MD;  Location: [facility];  Service: Orthopedics;  Laterality: Right;  . Lumbar laminectomy/decompression microdiscectomy N/A 01/08/2014    Procedure: L4-S1 Decompression with removal and reimplantation of spinal cord stimulator battery ;  Surgeon: Melina Schools, MD;  Location: [facility];  Service: Orthopedics;  Laterality: N/A;   Social History:  reports that he quit smoking about 11 years ago. His smoking use included Cigarettes. He has a 120 pack-year smoking history. He has never used smokeless tobacco. He reports that he does not drink alcohol or use illicit drugs. Patient lives at home with his wife & is able to participate in activities of daily living with use of a walker.  However now it's gotten to the point where he cannot even stand  Allergies  Allergen Reactions  . Codeine Itching  . Morphine And Related Other (See Comments)    hallucinations    Family History  Problem Relation Age of Onset  . Cancer Father   . Colon cancer Neg Hx   . Esophageal cancer Neg Hx   . Stomach cancer Neg Hx   . Rectal cancer Neg Hx     As discussed with patient  Prior to Admission medications   Medication Sig Start Date End Date Taking? Authorizing Provider  celecoxib (CELEBREX) 200 MG capsule Take 200 mg by mouth 2 (two) times daily. 01/20/14  Yes Historical Provider, MD  Cholecalciferol (VITAMIN D-3) 5000 UNITS TABS Take 5,000 Units by  mouth 2 (two) times daily.   Yes Historical Provider, MD  ciprofloxacin (CIPRO) 500 MG tablet Take 1 tablet (500 mg total) by mouth 2 (two) times daily. 04/02/14  Yes Velvet Bathe, MD  cloNIDine (CATAPRES) 0.1 MG tablet Take 0.1 mg by mouth 2 (two) times daily.   Yes Historical Provider, MD  cyclobenzaprine (FLEXERIL) 10 MG tablet Take 10 mg by mouth 3 (three) times daily as needed for muscle spasms. Muscle spasm. 03/02/11  Yes Historical Provider, MD  DULoxetine (CYMBALTA) 60 MG capsule Take 60 mg by mouth daily.  10/09/12  Yes Historical Provider, MD  fentaNYL 0.05 MG/ML SOLN 5,000 mcg Inject into the skin continuous. Patient will bring dosage instructions with him. Daily dose 0.84mg    Yes Historical Provider, MD  glimepiride (AMARYL) 4 MG tablet Take 4 mg by mouth daily with breakfast.  01/12/14  Yes Historical Provider, MD  lidocaine (LIDODERM) 5 % Place 1 patch onto the skin daily.  01/12/14  Yes Historical Provider, MD  LYRICA 150 MG capsule Take 150 mg by mouth 2 (two) times daily.  02/21/11  Yes Historical Provider, MD  metFORMIN (GLUCOPHAGE) 500 MG tablet Take 1,000 mg by mouth 2 (two) times daily with a meal.   Yes Historical Provider, MD  simvastatin (ZOCOR) 40 MG tablet Take 40 mg by mouth daily.   Yes Historical Provider, MD  sitaGLIPtin (JANUVIA) 100 MG tablet Take 50 mg by mouth daily. 11/21/13  Yes Unk Pinto, MD  tamsulosin (FLOMAX) 0.4 MG CAPS capsule Take 0.4 mg by mouth daily.  05/07/13  Yes Historical Provider, MD  traMADol (ULTRAM) 50 MG tablet Take 50 mg by mouth 3 (three) times daily as needed. pain 02/21/11  Yes Historical Provider, MD    Physical Exam: BP 137/80 mmHg  Pulse 105  Temp(Src) 98.4 F (36.9 C) (Oral)  Resp 20  SpO2 98%  General:  Alert and oriented 3, mild distress secondary from pain Eyes: Sclera nonicteric, extraocular movements are intact ENT: Normocephalic, atraumatic min extremities are slightly dry Neck: Thick, no JVD Cardiovascular: Regular  rate and rhythm, S1-S2 Respiratory: Clear to auscultation bilaterally Abdomen: Soft, nontender, nondistended, positive bowel sounds Skin: No skin breaks, tears or lesions Musculoskeletal: No clubbing or cyanosis, trace edema. Patient is able to straight leg raise and externally rotate his right hip Psychiatric: Patient is appropriate, no evidence of psychoses Neurologic: No focal deficits other than some chronic lower extremity neuropathy           Labs on Admission:  Basic Metabolic Panel:  Recent Labs Lab 04/02/14 0500 04/07/14 0730  NA 137 132*  K 3.8 4.2  CL 102 93*  CO2 25 28  GLUCOSE 233* 301*  BUN 8 9  CREATININE  0.60 0.55  CALCIUM 8.9 9.1   Liver Function Tests:  Recent Labs Lab 04/07/14 0730  AST 25  ALT 34  ALKPHOS 131*  BILITOT 0.5  PROT 7.9  ALBUMIN 2.6*   No results for input(s): LIPASE, AMYLASE in the last 168 hours. No results for input(s): AMMONIA in the last 168 hours. CBC:  Recent Labs Lab 04/02/14 0500 04/07/14 0730  WBC 9.1 11.6*  NEUTROABS  --  8.5*  HGB 10.6* 11.3*  HCT 32.6* 33.4*  MCV 83.0 83.5  PLT 236 401*   Cardiac Enzymes: No results for input(s): CKTOTAL, CKMB, CKMBINDEX, TROPONINI in the last 168 hours.  BNP (last 3 results) No results for input(s): PROBNP in the last 8760 hours. CBG:  Recent Labs Lab 04/01/14 1206 04/01/14 1720 04/02/14 0004 04/02/14 0524 04/02/14 1202  GLUCAP 282* 315* 238* 212* 289*    Radiological Exams on Admission: Dg Hip Complete Right  04/07/2014   CLINICAL DATA:  Status post fall 3 days ago with persistent right hip pain. ; history of chronic back pain with Fentanyl pump  EXAM: RIGHT HIP - COMPLETE 2+ VIEW  COMPARISON:  None.  FINDINGS: The bony pelvis is mildly osteopenic. There is no acute fracture of the pelvis. The observed portions of the sacrum are unremarkable. There are degenerative changes of the lower lumbar spine. A electronic device which presumably reflects the Fentanyl pump  projects over the left iliac crest.  AP and lateral views of the right hip reveal the bones to be intact. The joint space is preserved. There is no significant degenerative change. The soft tissues are unremarkable.  IMPRESSION: There is no acute fracture nor dislocation of the right hip.   Electronically Signed   By: David  Martinique   On: 04/07/2014 09:03    EKG: Independently reviewed. Sinus tachycardia, borderline prolonged QT interval  Assessment/Plan Present on Admission:  . Right hip pain/generalized weakness: No evidence of fracture. Patient with severe weakness and debility and I do not think that he is going to be able to improve being at home. We'll ask physical and occupational therapy to see and consult social work for short-term skilled nursing   . 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 According to his wife who contacted his pain clinic, pump should last until this Friday. A 2 day prior alarm will sound on Wednesday. Ideally hoping to discharge the patient by then so that he can go follow-up with pain clinic to have this refilled. Continue his other home pain medications.   . Obesity: Patient meets criteria with BMI greater than 30  . COPD (chronic obstructive pulmonary disease): Stable. Breathing comfortably.  . Uncontrolled diabetes mellitus: Holding home meds. A1c checked previous admission was an 8.3. Have added Lantus  Opioid-induced constipation: Patient is not on a bowel regimen. He has not had a bowel movement 3 days. We'll start him on MiraLAX and Colace and continue this long-term  Consultants: None  Code Status: Full code as confirmed by patient  Family Communication: Wife at the bedside   Disposition Plan: PT and OT to see and hopefully patient can go to skilled nursing if that is an option and has seen his basal available  Time spent: 35 minutes  [facility] Pager  [phone]

## 2014-04-07 NOTE — ED Provider Notes (Signed)
CSN: [id number]     Arrival date & time 04/07/14  0542 History   First MD Initiated Contact with Patient 04/07/14 [id number]     Chief Complaint  Patient presents with  . Weakness  . Extremity Pain     (Consider location/radiation/quality/duration/timing/severity/associated sxs/prior Treatment) Patient is a 68 y.o. male presenting with hip pain.  Hip Pain This is a new problem. Episode onset: 3 days. The problem occurs constantly. The problem has been gradually worsening. Associated symptoms comments: Generalized weakness. Right hand pain, urinary urgency and incontinence. . Exacerbated by: movement, palpation, bearing weight. Nothing relieves the symptoms. Treatments tried: home pain medications.     Past Medical History  Diagnosis Date  . Anemia   . Arthritis   . Blood transfusion   . Clotting disorder   . Diabetes mellitus   . Borderline hypertension   . Venous insufficiency   . Hyperlipidemia   . Obesity   . Diverticulosis of colon   . Hx of colonic polyps   . Low back pain   . Lipoma   . RSD (reflex sympathetic dystrophy)   . Mental disorder   . Depression     hx of   . COPD (chronic obstructive pulmonary disease)     denies  . Pneumonia     hx   Past Surgical History  Procedure Laterality Date  . Back surgery  9379,0240  . Morphine pump  2009    due to reaction to morphine/changed to fentanyl  . Excision of lipoma from right olecranon area  11/2000    Dr. Rise [name]  . Microdiscectomy and decompression  05/2002    Dr. Tonita Cong  . C3-4 anterior cervical discectomy and fusion with plating at c3-4  05/2006    Dr. Arnoldo Morale  . Spinal cord stimulator implanted      for pain per Dr. Maryruth Eve  . Subcut pain pump implanted    . Pain pump implantation      with fentanyl  . Tonsillectomy    . Colonoscopy w/ polypectomy    . Knee arthroscopy      right knee  . 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 Total knee arthroplasty  02/29/2012    Procedure: TOTAL KNEE  ARTHROPLASTY;  Surgeon: Kerin Salen, MD;  Location: [facility];  Service: Orthopedics;  Laterality: Right;  . Lumbar laminectomy/decompression microdiscectomy N/A 01/08/2014    Procedure: L4-S1 Decompression with removal and reimplantation of spinal cord stimulator battery ;  Surgeon: Melina Schools, MD;  Location: [facility];  Service: Orthopedics;  Laterality: N/A;   Family History  Problem Relation Age of Onset  . Cancer Father   . Colon cancer Neg Hx   . Esophageal cancer Neg Hx   . Stomach cancer Neg Hx   . Rectal cancer Neg Hx    History  Substance Use Topics  . Smoking status: Former Smoker -- 3.00 packs/day for 40 years    Types: Cigarettes    Quit date: 05/25/2002  . Smokeless tobacco: Never Used  . Alcohol Use: No    Review of Systems  Genitourinary: Positive for urgency. Negative for dysuria.  Neurological: Positive for weakness.  All other systems reviewed and are negative.     Allergies  Codeine and Morphine and related  Home Medications   Prior to Admission medications   Medication Sig Start Date End Date Taking? Authorizing Provider  celecoxib (CELEBREX) 200 MG capsule Take 200 mg by mouth 2 (two) times daily.  01/20/14  Yes Historical Provider, MD  Cholecalciferol (VITAMIN D-3) 5000 UNITS TABS Take 5,000 Units by mouth 2 (two) times daily.   Yes Historical Provider, MD  ciprofloxacin (CIPRO) 500 MG tablet Take 1 tablet (500 mg total) by mouth 2 (two) times daily. 04/02/14  Yes Velvet Bathe, MD  cloNIDine (CATAPRES) 0.1 MG tablet Take 0.1 mg by mouth 2 (two) times daily.   Yes Historical Provider, MD  cyclobenzaprine (FLEXERIL) 10 MG tablet Take 10 mg by mouth 3 (three) times daily as needed for muscle spasms. Muscle spasm. 03/02/11  Yes Historical Provider, MD  DULoxetine (CYMBALTA) 60 MG capsule Take 60 mg by mouth daily.  10/09/12  Yes Historical Provider, MD  fentaNYL 0.05 MG/ML SOLN 5,000 mcg Inject into the skin continuous. Patient will bring dosage instructions with  him. Daily dose 0.84mg    Yes Historical Provider, MD  glimepiride (AMARYL) 4 MG tablet Take 4 mg by mouth daily with breakfast.  01/12/14  Yes Historical Provider, MD  lidocaine (LIDODERM) 5 % Place 1 patch onto the skin daily.  01/12/14  Yes Historical Provider, MD  LYRICA 150 MG capsule Take 150 mg by mouth 2 (two) times daily.  02/21/11  Yes Historical Provider, MD  metFORMIN (GLUCOPHAGE) 500 MG tablet Take 1,000 mg by mouth 2 (two) times daily with a meal.   Yes Historical Provider, MD  simvastatin (ZOCOR) 40 MG tablet Take 40 mg by mouth daily.   Yes Historical Provider, MD  sitaGLIPtin (JANUVIA) 100 MG tablet Take 50 mg by mouth daily. 11/21/13  Yes Unk Pinto, MD  tamsulosin (FLOMAX) 0.4 MG CAPS capsule Take 0.4 mg by mouth daily.  05/07/13  Yes Historical Provider, MD  traMADol (ULTRAM) 50 MG tablet Take 50 mg by mouth 3 (three) times daily as needed. pain 02/21/11  Yes Historical Provider, MD   BP 128/78 mmHg  Pulse 103  Temp(Src) 98.3 F (36.8 C) (Oral)  Resp 22  SpO2 94% Physical Exam  Constitutional: He is oriented to person, place, and time. He appears well-developed and well-nourished. No distress.  HENT:  Head: Normocephalic and atraumatic.  Mouth/Throat: Oropharynx is clear and moist.  Eyes: Conjunctivae are normal. Pupils are equal, round, and reactive to light. No scleral icterus.  Neck: Neck supple.  Cardiovascular: Regular rhythm, normal heart sounds and intact distal pulses.  Tachycardia present.   No murmur heard. Pulmonary/Chest: Effort normal and breath sounds normal. No stridor. No respiratory distress. He has no wheezes. He has no rales.  Abdominal: Soft. He exhibits no distension. There is no tenderness.  Genitourinary: Rectal exam shows no mass, no tenderness and anal tone normal.  Musculoskeletal: Normal range of motion. He exhibits no edema.       Right hip: He exhibits tenderness and bony tenderness. He exhibits normal range of motion (pain with external  rotation), normal strength, no swelling, no crepitus and no deformity.       Lumbar back: He exhibits no tenderness and no bony tenderness.  2+ BLE DP pulses. R hand: TTP of hand and wrist, no swelling, no decreased ROM, no erythema, no warmth  Neurological: He is alert and oriented to person, place, and time.  Normal lower extremity plantar and dorsiflexion, and hip flexion on both legs.    Skin: Skin is warm and dry. No rash noted.  Psychiatric: He has a normal mood and affect. His behavior is normal.  Nursing note and vitals reviewed.   ED Course  Procedures (including critical care time) Labs Review  Labs Reviewed  CBC WITH DIFFERENTIAL - Abnormal; Notable for the following:    WBC 11.6 (*)    RBC 4.00 (*)    Hemoglobin 11.3 (*)    HCT 33.4 (*)    Platelets 401 (*)    Neutro Abs 8.5 (*)    All other components within normal limits  COMPREHENSIVE METABOLIC PANEL  URINALYSIS, ROUTINE W REFLEX MICROSCOPIC    Imaging Review Dg Hip Complete Right  04/07/2014   CLINICAL DATA:  Status post fall 3 days ago with persistent right hip pain. ; history of chronic back pain with Fentanyl pump  EXAM: RIGHT HIP - COMPLETE 2+ VIEW  COMPARISON:  None.  FINDINGS: The bony pelvis is mildly osteopenic. There is no acute fracture of the pelvis. The observed portions of the sacrum are unremarkable. There are degenerative changes of the lower lumbar spine. A electronic device which presumably reflects the Fentanyl pump projects over the left iliac crest.  AP and lateral views of the right hip reveal the bones to be intact. The joint space is preserved. There is no significant degenerative change. The soft tissues are unremarkable.  IMPRESSION: There is no acute fracture nor dislocation of the right hip.   Electronically Signed   By: David  Martinique   On: 04/07/2014 09:03  All radiology studies independently viewed by me.      EKG Interpretation   Date/Time:  Monday April 07 2014 07:39:17  EST Ventricular Rate:  110 PR Interval:  168 QRS Duration: 98 QT Interval:  354 QTC Calculation: 479 R Axis:   74 Text Interpretation:  Sinus tachycardia Borderline prolonged QT interval  compared to prior, nonspecific t wave changes resolved Confirmed by  [facility]  MD, TREY ([id number]) on 04/07/2014 7:54:04 AM      MDM   Final diagnoses:  Fall  Right hip pain  Generalized weakness    68 yo male with recent admission for UTI, leg pain, and wrist pain who presents with further debilitation.  He fell several days ago, hurting his right hip.  Since then he has become weaker and weaker, to the point that he has been unable to ambulate today. (of note, he was able to ambulate on hip for a day or two after his fall).  He did not strike his head or lose consciousness with that fall.  I hoped to get him set up for home health, but even after fentanyl, he was still unable to ambulate.  I discussed his case with Internal Medicine, Dr. Maryland Pink, who will admit for further eval and treatment.      Houston Siren III, MD 04/07/14 [phone]

## 2014-04-07 NOTE — ED Notes (Signed)
MD at bedside. 

## 2014-04-07 NOTE — ED Notes (Signed)
Meal tray ordered 

## 2014-04-07 NOTE — Discharge Planning (Addendum)
Lounell Schumacher J. Clydene Laming, RN, BSN, [employer] [phone] Spoke with pt and spouse at bedside regarding discharge planning for [facility]. Offered pt list of home health agencies to choose from.  Pt chose [facility] to render services. Rhett Bannister of [place] notified.  No DME needs identified at this time. Spouse expressed need for ambulance ride home.  NCM communicated information to [facility].

## 2014-04-08 ENCOUNTER — Encounter (HOSPITAL_COMMUNITY): Payer: Self-pay | Admitting: General Practice

## 2014-04-08 ENCOUNTER — Ambulatory Visit (INDEPENDENT_AMBULATORY_CARE_PROVIDER_SITE_OTHER): Payer: No Typology Code available for payment source | Admitting: Neurological Surgery

## 2014-04-08 VITALS — HR 86 | Temp 98.2°F | Resp 16 | Ht 72.0 in | Wt 235.0 lb

## 2014-04-08 DIAGNOSIS — I609 Nontraumatic subarachnoid hemorrhage, unspecified: Secondary | ICD-10-CM

## 2014-04-08 DIAGNOSIS — S129XXD Fracture of neck, unspecified, subsequent encounter: Secondary | ICD-10-CM

## 2014-04-08 DIAGNOSIS — R627 Adult failure to thrive: Secondary | ICD-10-CM

## 2014-04-08 DIAGNOSIS — J438 Other emphysema: Secondary | ICD-10-CM

## 2014-04-08 LAB — GLUCOSE, CAPILLARY
GLUCOSE-CAPILLARY: 217 mg/dL — AB (ref 70–99)
Glucose-Capillary: 265 mg/dL — ABNORMAL HIGH (ref 70–99)
Glucose-Capillary: 326 mg/dL — ABNORMAL HIGH (ref 70–99)
Glucose-Capillary: 376 mg/dL — ABNORMAL HIGH (ref 70–99)

## 2014-04-08 MED ORDER — INSULIN GLARGINE 100 UNIT/ML ~~LOC~~ SOLN
15.0000 [IU] | Freq: Every day | SUBCUTANEOUS | Status: DC
  Administered 2014-04-08: 15 [IU] via SUBCUTANEOUS
  Filled 2014-04-08 (×2): qty 0.15

## 2014-04-08 MED ORDER — PREGABALIN 75 MG PO CAPS
200.0000 mg | ORAL_CAPSULE | Freq: Two times a day (BID) | ORAL | Status: DC
  Administered 2014-04-08 – 2014-04-09 (×3): 200 mg via ORAL
  Filled 2014-04-08 (×2): qty 2
  Filled 2014-04-08 (×2): qty 1
  Filled 2014-04-08: qty 2
  Filled 2014-04-08: qty 1

## 2014-04-08 MED ORDER — OXYCODONE HCL 5 MG PO TABS: 5.0000 mg | ORAL_TABLET | Freq: Four times a day (QID) | ORAL | Status: DC | PRN

## 2014-04-08 MED ORDER — PREGABALIN 75 MG PO CAPS: 200.0000 mg | ORAL_CAPSULE | Freq: Two times a day (BID) | ORAL | Status: DC

## 2014-04-08 NOTE — Evaluation (Signed)
Physical Therapy Evaluation Patient Details Name: James Parsons MRN: [id number] DOB: 09-17-1945 Today's Date: 04/08/2014   History of Present Illness  With past mental history of COPD, RSD with chronic pain and uncontrolled diabetes mellitus who was admitted last week following a fall which left him with weakness. Patient was to go home with home health, however due to scheduling issues possibly from holidays, home health was not able to meet the patient. Since that time was grown progressively weaker to the point where he could not even stand at this point.  Past Medical History  Diagnosis Date  . Anemia   . Arthritis   . Blood transfusion   . Clotting disorder   . Diabetes mellitus   . Borderline hypertension   . Venous insufficiency   . Hyperlipidemia   . Obesity   . Diverticulosis of colon   . Hx of colonic polyps   . Low back pain   . Lipoma   . RSD (reflex sympathetic dystrophy)   . Mental disorder   . Depression     hx of   . COPD (chronic obstructive pulmonary disease)     denies  . Pneumonia     hx   Past Surgical History  Procedure Laterality Date  . Back surgery  2952,8413  . Morphine pump  2009    due to reaction to morphine/changed to fentanyl  . Excision of lipoma from right olecranon area  11/2000    Dr. Rise [name]  . Microdiscectomy and decompression  05/2002    Dr. Tonita Cong  . C3-4 anterior cervical discectomy and fusion with plating at c3-4  05/2006    Dr. Arnoldo Morale  . Spinal cord stimulator implanted      for pain per Dr. Maryruth Eve  . Subcut pain pump implanted    . Pain pump implantation      with fentanyl  . Tonsillectomy    . Colonoscopy w/ polypectomy    . Knee arthroscopy      right knee  . 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 Total knee arthroplasty  02/29/2012    Procedure: TOTAL KNEE ARTHROPLASTY;  Surgeon: Kerin Salen, MD;  Location: [facility];  Service: Orthopedics;  Laterality: Right;  . Lumbar laminectomy/decompression  microdiscectomy N/A 01/08/2014    Procedure: L4-S1 Decompression with removal and reimplantation of spinal cord stimulator battery ;  Surgeon: Melina Schools, MD;  Location: [facility];  Service: Orthopedics;  Laterality: N/A;     Clinical Impression   Pt admitted with above diagnosis. Pt currently with functional limitations due to the deficits listed below (see PT Problem List).  Pt will benefit from skilled PT to increase their independence and safety with mobility to allow discharge to the venue listed below.    Overall, pt did perform better than he and his wife expected, and was able to walk a few feet; He is quite motivated and shows good rehab potential; They are interested in SNF for Rehab near their home in [place], and have requested [facility].     Follow Up Recommendations SNF;Supervision/Assistance - 24 hour    Equipment Recommendations  Wheelchair (measurements PT);Wheelchair cushion (measurements PT) (shower chair)    Recommendations for Other Services       Precautions / Restrictions Precautions Precautions: Fall Precaution Comments: Multiple falls at home      Mobility  Bed Mobility Overal bed mobility: Needs Assistance Bed Mobility: Rolling;Sidelying to Sit Rolling: Min guard Sidelying to sit: Mod  assist       General bed mobility comments: minguard assist with rolling; used rails, noted tendency to extend hips; Heavy mod assist with sidelying to sit; extensive cues for technique, and support given at shoulder and pelvic girdles  Transfers Overall transfer level: Needs assistance Equipment used: Rolling walker (2 wheeled) Transfers: Sit to/from [employer] Sit to Stand: Mod assist   Squat pivot transfers: Mod assist     General transfer comment: Heavy mod assist for both tyes of transfers with the need to closely guard and/or block both knees due to significant weakness; Cues for hand placement, safety  Ambulation/Gait Ambulation/Gait  assistance: +2 safety/equipment;Mod assist Ambulation Distance (Feet): 5 Feet Assistive device: Rolling walker (2 wheeled) Gait Pattern/deviations: Step-through pattern;Ataxic Gait velocity: Decreased   General Gait Details: Cues to self-monitor for activity tolerance and chair pushed behind for safety due to risk of falls; noted decr ability to use R hand on RW; unstable in standing  Stairs            Wheelchair Mobility    Modified Rankin (Stroke Patients Only)       Balance Overall balance assessment: Needs assistance Sitting-balance support: Bilateral upper extremity supported Sitting balance-Leahy Scale: Poor (approaching Fair) Sitting balance - Comments: Needing steadying assist at times in sitting   Standing balance support: Bilateral upper extremity supported Standing balance-Leahy Scale: Zero                               Pertinent Vitals/Pain Pain Assessment: Faces Faces Pain Scale: Hurts little more Pain Location: R hand, hip and knee Pain Descriptors / Indicators: Grimacing Pain Intervention(s): Monitored during session    Home Living Family/patient expects to be discharged to:: Skilled nursing facility Living Arrangements: Spouse/significant other Available Help at Discharge: Family;Available PRN/intermittently (wife works) Type of Home: House Home Access: Stairs to enter Entrance Stairs-Rails: None Technical brewer of Steps: 3 Home Layout: One level Home Equipment: Environmental consultant - 2 wheels;Cane - single point      Prior Function Level of Independence: Needs assistance   Gait / Transfers Assistance Needed: Extreme difficulty walking and transferring due to weakness with related admission last week     Comments: Uses cane most of the time.  Will use RW on days with increased pain.  Independent with ADL's, drives, cooks meals. (all of this prior to last week's admission)     Hand Dominance   Dominant Hand: Right     Extremity/Trunk Assessment   Upper Extremity Assessment: Defer to OT evaluation           Lower Extremity Assessment: Generalized weakness         Communication   Communication: No difficulties  Cognition Arousal/Alertness:  (Closes eyes often, but easily arousable) Behavior During Therapy: WFL for tasks assessed/performed Overall Cognitive Status: Within Functional Limits for tasks assessed                      General Comments      Exercises        Assessment/Plan    PT Assessment Patient needs continued PT services  PT Diagnosis Difficulty walking;Abnormality of gait;Generalized weakness;Acute pain   PT Problem List Decreased strength;Decreased activity tolerance;Decreased balance;Decreased mobility;Decreased knowledge of use of DME;Decreased safety awareness;Pain  PT Treatment Interventions DME instruction;Gait training;Stair training;Functional mobility training;Therapeutic activities;Therapeutic exercise;Patient/family education   PT Goals (Current goals can be found in the Care Plan section)  Acute Rehab PT Goals Patient Stated Goal: to get stronger PT Goal Formulation: With patient/family Time For Goal Achievement: 04/22/14 Potential to Achieve Goals: Good    Frequency Min 3X/week   Barriers to discharge Decreased caregiver support      Co-evaluation               End of Session Equipment Utilized During Treatment: Gait belt Activity Tolerance: Patient tolerated treatment well Patient left: in chair;with call bell/phone within reach;with chair alarm set;with family/visitor present Nurse Communication: Mobility status         Time: 8315-1761 PT Time Calculation (min) (ACUTE ONLY): 32 min   Charges:   PT Evaluation $Initial PT Evaluation Tier I: 1 Procedure PT Treatments $Gait Training: 8-22 mins $Therapeutic Activity: 8-22 mins   PT G Codes:        Quin Hoop 04/08/2014, 12:07 PM  Roney Marion, PT  Acute  Rehabilitation Services Pager [phone] Office [phone]

## 2014-04-08 NOTE — Evaluation (Signed)
Occupational Therapy Evaluation Patient Details Name: James Parsons MRN: [id number] DOB: 1945-05-29 Today's Date: 04/08/2014    History of Present Illness With past mental history of COPD, RSD with chronic pain and uncontrolled diabetes mellitus who was admitted last week following a fall and sepsis from UTI which left him with weakness. Since that time was grown progressively weaker to the point where he could not even stand.   Clinical Impression   At his baseline (prior to admission last week), pt was functioning at a modified independent level in ADL and IADL and driving. At the time of d/c last week, pt could ambulate with min guard assist throughout the nursing unit. Pt presents with lethargy (?pain medication) and significant generalized weakness impacting his ability to stand, perform ADL transfers and self care.  His R UE is typically his lead and currently is functioning as a gross assist.  Wife notes pt with cognitive decline over the past year. Pt will need short term rehab, will follow acutely.    Follow Up Recommendations  SNF;Supervision/Assistance - 24 hour    Equipment Recommendations  Wheelchair (measurements OT);Wheelchair cushion (measurements OT);3 in 1 bedside comode    Recommendations for Other Services       Precautions / Restrictions Precautions Precautions: Fall Precaution Comments: Multiple falls at home Restrictions Weight Bearing Restrictions: No      Mobility Bed Mobility        General bed mobility comments: Pt up in chair.  Transfers Overall transfer level: Needs assistance Equipment used: Rolling walker (2 wheeled) Transfers: Sit to/from [employer] Sit to Stand: Mod assist   Squat pivot transfers: Mod assist     General transfer comment: heavy mod assist, difficulty extending LEs with knees buckling, not able to assist much with R UE on walker due to pain, cues for technique    Balance Overall balance assessment:  Needs assistance Sitting-balance support: Bilateral upper extremity supported Sitting balance-Leahy Scale: Poor (approaching Fair) Sitting balance - Comments: Needing steadying assist at times in sitting   Standing balance support: Bilateral upper extremity supported Standing balance-Leahy Scale: Zero                              ADL Overall ADL's : Needs assistance/impaired Eating/Feeding: Minimal assistance;Sitting Eating/Feeding Details (indicate cue type and reason): uses L hand primarily which is not typical Grooming: Wash/dry hands;Wash/dry face;Minimal assistance;Sitting   Upper Body Bathing: Sitting;Moderate assistance   Lower Body Bathing: Sit to/from stand;Total assistance   Upper Body Dressing : Minimal assistance;Sitting   Lower Body Dressing: Sit to/from stand;Total assistance                 General ADL Comments: Pt unable to release walker to pull up pants or perform pericare.     Vision                 Additional Comments: no vision changes   Perception     Praxis      Pertinent Vitals/Pain Pain Assessment: Faces Faces Pain Scale: Hurts little more Pain Location: R UE Pain Descriptors / Indicators: Guarding;Grimacing Pain Intervention(s): Monitored during session;Premedicated before session;Repositioned;Limited activity within patient's tolerance     Hand Dominance Right   Extremity/Trunk Assessment Upper Extremity Assessment Upper Extremity Assessment: RUE deficits/detail RUE Deficits / Details: grossly 3/5 throughout, pain due to RSD RUE: Unable to fully assess due to pain RUE Coordination: decreased fine motor;decreased gross motor  Lower Extremity Assessment Lower Extremity Assessment: Defer to PT evaluation   Cervical / Trunk Assessment Cervical / Trunk Assessment: Normal   Communication Communication Communication: No difficulties   Cognition Arousal/Alertness: Lethargic Behavior During Therapy: WFL for  tasks assessed/performed Overall Cognitive Status: History of cognitive impairments - at baseline       Memory: Decreased short-term memory (wife reports confusion, memory deficits x 1 year)             General Comments       Exercises       Shoulder Instructions      Home Living Family/patient expects to be discharged to:: Skilled nursing facility Living Arrangements: Spouse/significant other Available Help at Discharge: Family;Available PRN/intermittently Type of Home: House Home Access: Stairs to enter [employer] of Steps: 3 Entrance Stairs-Rails: None Home Layout: One level               Home Equipment: Walker - 2 wheels;Cane - single point          Prior Functioning/Environment Level of Independence: Needs assistance  Gait / Transfers Assistance Needed: Prior to last week's admission, pt modified independent with mobility. ADL's / Homemaking Assistance Needed: Pt's baseline in independent, drives, cooks   Comments: Will use cane or walker depending on pain.    OT Diagnosis: Generalized weakness;Cognitive deficits;Acute pain   OT Problem List: Decreased strength;Decreased activity tolerance;Impaired balance (sitting and/or standing);Decreased coordination;Decreased cognition;Decreased knowledge of use of DME or AE;Obesity;Impaired UE functional use;Pain   OT Treatment/Interventions: Self-care/ADL training;Therapeutic exercise;DME and/or AE instruction;Therapeutic activities;Patient/family education;Balance training    OT Goals(Current goals can be found in the care plan section) Acute Rehab OT Goals Patient Stated Goal: to get stronger OT Goal Formulation: With patient Time For Goal Achievement: 04/22/14 Potential to Achieve Goals: Good ADL Goals Pt Will Perform Eating: with modified independence;with adaptive utensils;sitting (with R hand lead) Pt Will Perform Grooming: with min guard assist;standing;with adaptive equipment (with R hand  lead) Pt Will Perform Upper Body Bathing: with supervision;sitting Pt Will Perform Lower Body Bathing: with min guard assist;with adaptive equipment;sit to/from stand Pt Will Perform Upper Body Dressing: with supervision;sitting Pt Will Perform Lower Body Dressing: with min guard assist;with adaptive equipment;sit to/from stand Pt Will Transfer to Toilet: with min guard assist;ambulating;bedside commode (over toilet) Pt Will Perform Toileting - Clothing Manipulation and hygiene: with min guard assist;sit to/from stand Pt/caregiver will Perform Home Exercise Program: Increased strength;Right Upper extremity (AROM )  OT Frequency: Min 2X/week   Barriers to D/C: Decreased caregiver support          Co-evaluation              End of Session    Activity Tolerance: Patient limited by lethargy Patient left: in chair;with call bell/phone within reach;with family/visitor present   Time: 5374-8270 OT Time Calculation (min): 39 min Charges:  OT General Charges $OT Visit: 1 Procedure OT Evaluation $Initial OT Evaluation Tier I: 1 Procedure OT Treatments $Self Care/Home Management : 23-37 mins G-Codes: OT G-codes **NOT FOR INPATIENT CLASS** Functional Assessment Tool Used: clincal judgement Functional Limitation: Self care Self Care Current Status (B8675): At least 60 percent but less than 80 percent impaired, limited or restricted Self Care Goal Status (Q4920): At least 1 percent but less than 20 percent impaired, limited or restricted  Malka So 04/08/2014, 12:42 PM  [phone]

## 2014-04-08 NOTE — Progress Notes (Signed)
Physical Therapy Note  Addendum for missed G code:    April 26, 2014 1100  PT G-Codes **NOT FOR INPATIENT CLASS**  Functional Assessment Tool Used Clinical Judgement  Functional Limitation Mobility: Walking and moving around  Mobility: Walking and Moving Around Current Status [phone]) CK  Mobility: Walking and Moving Around Goal Status [phone]) CH   Thanks,  Roney Marion, [place]  Acute Rehabilitation Services Pager [phone] Office [phone]

## 2014-04-08 NOTE — Progress Notes (Signed)
[facility] PROGRESS NOTE  James Parsons ION:[id number] DOB: Dec 05, 1945 DOA: 04/07/2014 PCP: Alesia Richards, MD  Assessment/Plan: 1. Failure to thrive/debility -Recently discharged from the medicine service on 04/02/2014 in stable condition to his home -Unfortunate remains weak, deconditioned, having recurrent falls -Initial workup not revealing obvious source of infection -Physical therapy was consulted -Patient will likely benefit from rehabilitation at skilled nursing facility -Case discussed with social work who will be working on placement.  2.  History of sepsis/urinary tract infection -Repeat urinalysis on 05/08/2013 showing resolution of UTI -He is afebrile, nontoxic, hemodynamically stable -Patient completed his antibiotic therapy at home  3.  Type 2 diabetes mellitus, uncontrolled -Patient's blood sugars in the 300 range over the course of the day, will increase his Lantus from 5 to 15 units subcutaneous daily at bedtime -Continue Accu-Cheks before every meal and daily at bedtime with sliding scale coverage  4.  Hypertension -Continue clonidine 0.1 mg by mouth twice a day -Last blood pressure was 137/98. Will continue monitoring blood pressures   Code Status: Full code Family Communication: Spoke to his wife was present at bedside Disposition Plan: SNF placement when bed available   Consultants:  Social work  Physical therapy   HPI/Subjective: Patient is a pleasant 68 year old with a past medical history of chronic obstructive pulmonary disease, RSD with chronic pain syndrome, type 2 diabetes mellitus who was discharged from the medicine service on 04/02/2014 at which time he was treated for sepsis secondary to urinary tract infection. Patient was discharged on ciprofloxacin to his home. Patient having failure to thrive, ongoing generalized weakness, recurrent falls, for which she was brought to the emergency department on 04/07/2014. Workup did not  reveal an obvious source of infection. Patient deconditioning/debility likely secondary to previous hospitalization for which physical therapy/occupational therapy consulted, patient will likely require skilled nursing facility placement. Case discussed with social work.   Objective: Filed Vitals:   04/08/14 1300  BP: 137/98  Pulse: 107  Temp: 98.1 F (36.7 C)  Resp: 20    Intake/Output Summary (Last 24 hours) at 04/08/14 1709 Last data filed at 04/08/14 1300  Gross per 24 hour  Intake    960 ml  Output   1200 ml  Net   -240 ml   There were no vitals filed for this visit.  Exam:   General:  Patient is in no acute distress, awake, alert, denies nausea vomiting fevers or chills  Cardiovascular: Regular rate and rhythm normal S1-S2  Respiratory: Normal respiratory effort, lungs are clear  Abdomen: Soft nontender nondistended positive bowel sounds  Musculoskeletal: No edema  Data Reviewed: Basic Metabolic Panel:  Recent Labs Lab 04/02/14 0500 04/07/14 0730  NA 137 132*  K 3.8 4.2  CL 102 93*  CO2 25 28  GLUCOSE 233* 301*  BUN 8 9  CREATININE 0.60 0.55  CALCIUM 8.9 9.1   Liver Function Tests:  Recent Labs Lab 04/07/14 0730  AST 25  ALT 34  ALKPHOS 131*  BILITOT 0.5  PROT 7.9  ALBUMIN 2.6*   No results for input(s): LIPASE, AMYLASE in the last 168 hours. No results for input(s): AMMONIA in the last 168 hours. CBC:  Recent Labs Lab 04/02/14 0500 04/07/14 0730  WBC 9.1 11.6*  NEUTROABS  --  8.5*  HGB 10.6* 11.3*  HCT 32.6* 33.4*  MCV 83.0 83.5  PLT 236 401*   Cardiac Enzymes: No results for input(s): CKTOTAL, CKMB, CKMBINDEX, TROPONINI in the last 168 hours. BNP (last 3 results) No  results for input(s): PROBNP in the last 8760 hours. CBG:  Recent Labs Lab 04/02/14 1202 04/07/14 2102 04/08/14 0640 04/08/14 1128 04/08/14 1632  GLUCAP 289* 432* 265* 326* 376*    Recent Results (from the past 240 hour(s))  Urine culture     Status:  None   Collection Time: 03/30/14 10:47 PM  Result Value Ref Range Status   Specimen Description URINE, CLEAN CATCH  Final   Special Requests NONE  Final   Culture  Setup Time   Final    03/31/2014 10:20 Performed at [facility]   Final    >=100,000 COLONIES/ML Performed at [facility] Performed at [facility]    Report Status 04/02/2014 FINAL  Final   Organism ID, Bacteria ESCHERICHIA COLI  Final      Susceptibility   Escherichia coli - MIC*    AMPICILLIN <=2 SENSITIVE Sensitive     CEFAZOLIN <=4 SENSITIVE Sensitive     CEFTRIAXONE <=1 SENSITIVE Sensitive     CIPROFLOXACIN <=0.25 SENSITIVE Sensitive     GENTAMICIN <=1 SENSITIVE Sensitive     LEVOFLOXACIN <=0.12 SENSITIVE Sensitive     NITROFURANTOIN <=16 SENSITIVE Sensitive     TOBRAMYCIN <=1 SENSITIVE Sensitive     TRIMETH/SULFA <=20 SENSITIVE Sensitive     PIP/TAZO <=4 SENSITIVE Sensitive     * ESCHERICHIA COLI  Culture, blood (routine x 2)     Status: None   Collection Time: 03/31/14 12:06 AM  Result Value Ref Range Status   Specimen Description BLOOD LEFT ANTECUBITAL  Final   Special Requests BOTTLES DRAWN AEROBIC AND ANAEROBIC 5CC EA  Final   Culture  Setup Time   Final    03/31/2014 10:18 Performed at [facility]   Final    NO GROWTH 5 DAYS Performed at [facility]    Report Status 04/06/2014 FINAL  Final  Culture, blood (routine x 2)     Status: None   Collection Time: 03/31/14 12:12 AM  Result Value Ref Range Status   Specimen Description BLOOD LEFT WRIST  Final   Special Requests BOTTLES DRAWN AEROBIC ONLY 8CC  Final   Culture  Setup Time   Final    03/31/2014 10:18 Performed at [facility]   Final    NO GROWTH 5 DAYS Performed at [facility]    Report Status 04/06/2014 FINAL  Final     Studies: Dg Hip Complete Right  04/07/2014   CLINICAL DATA:  Status  post fall 3 days ago with persistent right hip pain. ; history of chronic back pain with Fentanyl pump  EXAM: RIGHT HIP - COMPLETE 2+ VIEW  COMPARISON:  None.  FINDINGS: The bony pelvis is mildly osteopenic. There is no acute fracture of the pelvis. The observed portions of the sacrum are unremarkable. There are degenerative changes of the lower lumbar spine. A electronic device which presumably reflects the Fentanyl pump projects over the left iliac crest.  AP and lateral views of the right hip reveal the bones to be intact. The joint space is preserved. There is no significant degenerative change. The soft tissues are unremarkable.  IMPRESSION: There is no acute fracture nor dislocation of the right hip.   Electronically Signed   By: David  Martinique   On: 04/07/2014 09:03    Scheduled Meds: . celecoxib  200 mg Oral BID  . cloNIDine  0.1 mg Oral BID  . docusate sodium  100 mg Oral BID  . DULoxetine  60 mg Oral Daily  . enoxaparin (LOVENOX) injection  40 mg Subcutaneous Q24H  . insulin aspart  0-20 Units Subcutaneous TID WC  . insulin aspart  0-5 Units Subcutaneous QHS  . insulin glargine  5 Units Subcutaneous QHS  . lidocaine  1 patch Transdermal Q24H  . polyethylene glycol  17 g Oral Daily  . pregabalin  200 mg Oral BID  . simvastatin  40 mg Oral q1800  . tamsulosin  0.4 mg Oral Daily   Continuous Infusions:   Principal Problem:   Weakness Active Problems:   Right hip pain   RSD (reflex sympathetic dystrophy)   Obesity   COPD (chronic obstructive pulmonary disease)   Uncontrolled diabetes mellitus    Time spent: 35 min    Kelvin Cellar  [facility] Pager [phone]. If 7PM-7AM, please contact night-coverage at [email or web address], password [facility] 04/08/2014, 5:09 PM  LOS: 1 day

## 2014-04-08 NOTE — Progress Notes (Signed)
Inpatient Diabetes Program Recommendations  AACE/ADA: New Consensus Statement on Inpatient Glycemic Control (2013)  Target Ranges:  Prepandial:   less than 140 mg/dL      Peak postprandial:   less than 180 mg/dL (1-2 hours)      Critically ill patients:  140 - 180 mg/dL   Reason for Visit: Hyperglycemia  Diabetes history: DM2 Outpatient Diabetes medications: Amaryl 4 mg QD, metformin 1000 mg bid, januvia 50 mg QD Current orders for Inpatient glycemic control: Lantus 5 units QHS, Novolog resistant tidwc and hs Results for DAMYAN, CORNE (MRN [id number]) as of 04/08/2014 15:22  Ref. Range 04/02/2014 05:24 04/02/2014 12:02 04/07/2014 21:02 04/08/2014 06:40 04/08/2014 11:28  Glucose-Capillary Latest Range: 70-99 mg/dL 212 (H) 289 (H) 432 (H) 265 (H) 326 (H)  Results for FINNEUS, KANESHIRO (MRN [id number]) as of 04/08/2014 15:22  Ref. Range 03/31/2014 05:10  Hgb A1c MFr Bld Latest Range: <5.7 % 8.6 (H)   Needs insulin adjustment for elevated CBGs.   Inpatient Diabetes Program Recommendations Insulin - Basal: Increase Lantus to 15 units QHS Insulin - Meal Coverage: Add Novolog 4 units tidwc for meal coverage insulin if pt eats >50% meal HgbA1C: 8.6% - uncontrolled  Will need f/u with PCP for elevated HgbA1C. If pt is to go home on insulin, will order insulin starter kit and RN to begin teaching insulin administration.  Will continue to follow. Thank you. Lorenda Peck, RD, LDN, CDE Inpatient Diabetes Coordinator [phone]

## 2014-04-08 NOTE — Progress Notes (Signed)
UR completed 

## 2014-04-09 LAB — GLUCOSE, CAPILLARY
Glucose-Capillary: 241 mg/dL — ABNORMAL HIGH (ref 70–99)
Glucose-Capillary: 323 mg/dL — ABNORMAL HIGH (ref 70–99)
Glucose-Capillary: 303 mg/dL — ABNORMAL HIGH (ref 70–99)

## 2014-04-09 MED ORDER — INSULIN ASPART 100 UNIT/ML ~~LOC~~ SOLN: SUBCUTANEOUS | Status: DC

## 2014-04-09 MED ORDER — TRAMADOL HCL 50 MG PO TABS: 50.0000 mg | ORAL_TABLET | Freq: Four times a day (QID) | ORAL | Status: DC | PRN

## 2014-04-09 MED ORDER — LIDOCAINE 5 % EX PTCH: 1.0000 | MEDICATED_PATCH | CUTANEOUS | Status: DC

## 2014-04-09 MED ORDER — INSULIN GLARGINE 100 UNIT/ML ~~LOC~~ SOLN: 15.0000 [IU] | Freq: Every day | SUBCUTANEOUS | Status: DC

## 2014-04-09 MED ORDER — POLYETHYLENE GLYCOL 3350 17 G PO PACK: 17.0000 g | PACK | Freq: Every day | ORAL | Status: DC

## 2014-04-09 MED ORDER — DSS 100 MG PO CAPS: 100.0000 mg | ORAL_CAPSULE | Freq: Two times a day (BID) | ORAL | Status: DC

## 2014-04-09 NOTE — Discharge Summary (Signed)
PATIENT DETAILS Name: James Parsons Age: 68 y.o. Sex: male Date of Birth: Sep 04, 1945 MRN: [id number]. Admitting Physician: Annita Brod, MD WUJ:WJXBJYN,WGNFAOZ DAVID, MD  Admit Date: 04/07/2014 Discharge date: 04/09/2014  Recommendations for Outpatient Follow-up:  1. Please check CBC and BMET in 1 week 2. Patient will need fentanyl pump refill by 04/11/14 3. Patient will need follow-up with regular pain management M.D. at [facility] DIAGNOSIS:  Principal Problem:   Weakness Active Problems:   Right hip pain   RSD (reflex sympathetic dystrophy)   Obesity   COPD (chronic obstructive pulmonary disease)   Uncontrolled diabetes mellitus      PAST MEDICAL HISTORY: Past Medical History  Diagnosis Date  . Anemia   . Arthritis   . Blood transfusion   . Clotting disorder   . Diabetes mellitus   . Borderline hypertension   . Venous insufficiency   . Hyperlipidemia   . Obesity   . Diverticulosis of colon   . Hx of colonic polyps   . Low back pain   . Lipoma   . RSD (reflex sympathetic dystrophy)   . Mental disorder   . Depression     hx of   . COPD (chronic obstructive pulmonary disease)     denies  . Pneumonia     hx    DISCHARGE MEDICATIONS: Current Discharge Medication List    START taking these medications   Details  docusate sodium 100 MG CAPS Take 100 mg by mouth 2 (two) times daily. Qty: 10 capsule, Refills: 0    insulin aspart (NOVOLOG) 100 UNIT/ML injection insulin aspart (novoLOG) injection 0-20 Units 0-20 Units, Subcutaneous, 3 times daily with meals CBG < 70: implement hypoglycemia protocol CBG 70 - 120: 0 units CBG 121 - 150: 3 units CBG 151 - 200: 4 units CBG 201 - 250: 7 units CBG 251 - 300: 11 units CBG 301 - 350: 15 units CBG 351 - 400: 20 units CBG > 400: call MD Qty: 10 mL, Refills: 11    insulin glargine (LANTUS) 100 UNIT/ML injection Inject 0.15 mLs (15 Units total) into the skin at bedtime. Qty: 10 mL,  Refills: 11    polyethylene glycol (MIRALAX / GLYCOLAX) packet Take 17 g by mouth daily. Qty: 14 each, Refills: 0      CONTINUE these medications which have CHANGED   Details  lidocaine (LIDODERM) 5 % Place 1 patch onto the skin daily. Qty: 30 patch, Refills: 0    traMADol (ULTRAM) 50 MG tablet Take 1 tablet (50 mg total) by mouth every 6 (six) hours as needed for moderate pain. pain Qty: 30 tablet, Refills: 0      CONTINUE these medications which have NOT CHANGED   Details  celecoxib (CELEBREX) 200 MG capsule Take 200 mg by mouth 2 (two) times daily. Refills: 2    Cholecalciferol (VITAMIN D-3) 5000 UNITS TABS Take 5,000 Units by mouth 2 (two) times daily.    cloNIDine (CATAPRES) 0.1 MG tablet Take 0.1 mg by mouth 2 (two) times daily.    cyclobenzaprine (FLEXERIL) 10 MG tablet Take 10 mg by mouth 3 (three) times daily as needed for muscle spasms. Muscle spasm.    DULoxetine (CYMBALTA) 60 MG capsule Take 60 mg by mouth daily.     fentaNYL 0.05 MG/ML SOLN 5,000 mcg Inject into the skin continuous. Patient will bring dosage instructions with him. Daily dose 0.84mg     glimepiride (AMARYL) 4 MG tablet Take 4 mg by mouth  daily with breakfast.  Refills: 0    LYRICA 150 MG capsule Take 150 mg by mouth 2 (two) times daily.     metFORMIN (GLUCOPHAGE) 500 MG tablet Take 1,000 mg by mouth 2 (two) times daily with a meal.    simvastatin (ZOCOR) 40 MG tablet Take 40 mg by mouth daily.    sitaGLIPtin (JANUVIA) 100 MG tablet Take 50 mg by mouth daily.    tamsulosin (FLOMAX) 0.4 MG CAPS capsule Take 0.4 mg by mouth daily.       STOP taking these medications     ciprofloxacin (CIPRO) 500 MG tablet         ALLERGIES:   Allergies  Allergen Reactions  . Codeine Itching  . Morphine And Related Other (See Comments)    hallucinations    BRIEF HPI:  See H&P, Labs, Consult and Test reports for all details in brief, patient is a 68 year old male with a history of RSD, COPD, chronic  pain with the fentanyl pump in place, diabetes who was just discharged from this hospital, and came back to the hospital on 12/28 for worsening weakness. He was admitted for further evaluation and treatment  CONSULTATIONS:   None  PERTINENT RADIOLOGIC STUDIES: Dg Chest 2 View  03/31/2014   CLINICAL DATA:  Generalized weakness  EXAM: CHEST  2 VIEW  COMPARISON:  10/16/2013  FINDINGS: The heart size at upper limits of normal. Spinal stimulator noted. Both lungs are clear. The visualized skeletal structures are unremarkable but subjectively osteopenic.  IMPRESSION: No active cardiopulmonary disease.   Electronically Signed   By: Conchita Paris M.D.   On: 03/31/2014 00:45   Dg Wrist Complete Right  03/31/2014   CLINICAL DATA:  Acute right lateral wrist pain and swelling since last night without trauma  EXAM: RIGHT WRIST - COMPLETE 3+ VIEW  COMPARISON:  None.  FINDINGS: There is no evidence of fracture or dislocation. There is no evidence of arthropathy or other focal bone abnormality. Soft tissues are unremarkable. Scapholunate distance at upper limits of normal measuring 3 mm. Degenerative change noted at the first carpometacarpal joint.  IMPRESSION: No acute osseous abnormality.  Scapholunate distance of 3 mm at upper limits of normal. Correlate for point tenderness to determine chronicity.   Electronically Signed   By: Conchita Paris M.D.   On: 03/31/2014 00:44   Dg Hip Complete Right  04/07/2014   CLINICAL DATA:  Status post fall 3 days ago with persistent right hip pain. ; history of chronic back pain with Fentanyl pump  EXAM: RIGHT HIP - COMPLETE 2+ VIEW  COMPARISON:  None.  FINDINGS: The bony pelvis is mildly osteopenic. There is no acute fracture of the pelvis. The observed portions of the sacrum are unremarkable. There are degenerative changes of the lower lumbar spine. A electronic device which presumably reflects the Fentanyl pump projects over the left iliac crest.  AP and lateral views of  the right hip reveal the bones to be intact. The joint space is preserved. There is no significant degenerative change. The soft tissues are unremarkable.  IMPRESSION: There is no acute fracture nor dislocation of the right hip.   Electronically Signed   By: David  Martinique   On: 04/07/2014 09:03   Dg Knee 1-2 Views Left  03/31/2014   CLINICAL DATA:  Medial left knee pain.  Knee gave out 2 days ago.  EXAM: LEFT KNEE - 1-2 VIEW  COMPARISON:  None.  FINDINGS: No acute fracture or dislocation is identified. There  is a small knee joint effusion. Medial and lateral compartment joint space widths appear preserved. No lytic or blastic osseous lesion is identified. No radiopaque foreign body.  IMPRESSION: Small knee joint effusion.  No acute osseous abnormality identified.   Electronically Signed   By: Logan Bores   On: 03/31/2014 08:28   Ct Head Wo Contrast  03/31/2014   CLINICAL DATA:  Dizziness, fall  EXAM: CT HEAD WITHOUT CONTRAST  TECHNIQUE: Contiguous axial images were obtained from the base of the skull through the vertex without intravenous contrast.  COMPARISON:  10/16/2013  FINDINGS: Mild cortical volume loss noted with proportional ventricular prominence. Areas of periventricular white matter hypodensity are most compatible with small vessel ischemic change. No acute hemorrhage, infarct, or mass lesion is identified. No midline shift. No acute osseous abnormality. Orbits and paranasal sinuses are unremarkable.  IMPRESSION: No acute intracranial finding.   Electronically Signed   By: Conchita Paris M.D.   On: 03/31/2014 00:57   Ct Lumbar Spine Wo Contrast  03/31/2014   CLINICAL DATA:  68 year old male post fall yesterday with left leg pain. Spine stimulator device in place and unable to have MR. Initial encounter.  EXAM: CT LUMBAR SPINE WITHOUT CONTRAST  TECHNIQUE: Multidetector CT imaging of the lumbar spine was performed without intravenous contrast administration. Multiplanar CT image  reconstructions were also generated.  COMPARISON:  Intraoperative lateral view of the lumbar spine 01/08/2014. Postmyelogram CT 09/04/2013.  FINDINGS: Last fully open disk space is labeled L5-S1. Present examination incorporates from T12-L1 disc space through the lower sacrum.  Atherosclerotic type changes of the abdominal aorta and involving branch vessels. No focal abdominal aortic aneurysm. Mildly ectatic iliac arteries.  Stimulating device leads at the T12-L1, L1-2 and L2-3 disc space level directed superiorly similar to prior exam.  No acute fracture detected.  T12-L1: Facet joint degenerative changes and bony overgrowth greater on the right. Stimulating device leads is in place. Narrowing of the thecal sac greater on the right. Foraminal narrowing greater on the right.  L1-2: Facet joint degenerative changes and bony overgrowth greater on the right. Superior aspect of the L2 facet extends into the neural foramen greater on right with severe right foraminal narrowing and moderate left foraminal narrowing. Disc osteophyte complex greater right lateral position. Stimulating device in place. Moderate spinal stenosis greater on the right.  L2-3: Facet joint degenerative changes and bony overgrowth. Ligamentum flavum hypertrophy partially calcified greater on the right. Disc osteophyte complex. Severe right-sided and moderate to marked left-sided spinal stenosis and lateral recess stenosis. Mild bilateral foraminal narrowing.  L3-4: Facet joint degenerative changes and bony overgrowth greater on the right. Disc osteophyte complex greater right paracentral position. Marked right-sided and moderate left-sided lateral recess stenosis and spinal stenosis. Foraminal extension of disc osteophyte on the right with moderate foraminal narrowing and encroachment upon the exiting right L3 nerve root. Mild to slightly moderate left foraminal narrowing.  L4-5: Prominent bilateral facet joint degenerative changes. Ligamentum  flavum hypertrophy. Prior laminectomy. Bulge greater right foraminal position. Moderate to marked right-sided and moderate left foraminal narrowing. Small amount a gas within the narrowed left neural foramen may represent gas containing synovial cyst. Difficult to evaluate the thecal sac without intrathecal contrast given the degree of significant postoperative changes.  L5-S1: Facet joint degenerative changes. Bulge with osteophyte and foraminal extension contributing to moderate foraminal narrowing greater on the left. Post laminectomy. Difficult to evaluate the thecal sac without into thecal contrast.  IMPRESSION: Summary of pertinent findings includes:  T12-L1  multifactorial moderate narrowing of the thecal sac greater on the right. Foraminal narrowing greater on the right.  L1-2 multifactorial severe right foraminal narrowing and moderate left foraminal narrowing. Moderate spinal stenosis greater on the right.  L2-3 multifactorial severe right-sided and moderate to marked left-sided spinal stenosis and lateral recess stenosis. Mild bilateral foraminal narrowing.  L3-4 multifactorial marked right-sided and moderate left-sided lateral recess stenosis and spinal stenosis. Multifactorial moderate right foraminal narrowing and encroachment upon the exiting right L3 nerve root. Mild to slightly moderate left foraminal narrowing.  L4-5 multifactorial moderate to marked right-sided and moderate left foraminal narrowing. Small amount a gas within the narrowed left neural foramen may represent gas containing synovial cyst. Post laminectomy. Difficult to evaluate the thecal sac without intrathecal contrast given the degree of significant postoperative changes.  L5-S1 multifactorial moderate foraminal narrowing greater on the left. Post laminectomy. Difficult to evaluate the thecal sac without into thecal contrast.  Please see above for further detail.   Electronically Signed   By: Chauncey Cruel M.D.   On: 03/31/2014 21:50       PERTINENT LAB RESULTS: CBC:  Recent Labs  04/07/14 0730  WBC 11.6*  HGB 11.3*  HCT 33.4*  PLT 401*   CMET CMP     Component Value Date/Time   NA 132* 04/07/2014 0730   K 4.2 04/07/2014 0730   CL 93* 04/07/2014 0730   CO2 28 04/07/2014 0730   GLUCOSE 301* 04/07/2014 0730   BUN 9 04/07/2014 0730   CREATININE 0.55 04/07/2014 0730   CREATININE 0.65 12/24/2013 1328   CALCIUM 9.1 04/07/2014 0730   PROT 7.9 04/07/2014 0730   ALBUMIN 2.6* 04/07/2014 0730   AST 25 04/07/2014 0730   ALT 34 04/07/2014 0730   ALKPHOS 131* 04/07/2014 0730   BILITOT 0.5 04/07/2014 0730   GFRNONAA >90 04/07/2014 0730   GFRNONAA >89 12/24/2013 1328   GFRAA >90 04/07/2014 0730   GFRAA >89 12/24/2013 1328    GFR Estimated Creatinine Clearance: 103.1 mL/min (by C-G formula based on Cr of 0.55). No results for input(s): LIPASE, AMYLASE in the last 72 hours. No results for input(s): CKTOTAL, CKMB, CKMBINDEX, TROPONINI in the last 72 hours. Invalid input(s): POCBNP No results for input(s): DDIMER in the last 72 hours. No results for input(s): HGBA1C in the last 72 hours. No results for input(s): CHOL, HDL, LDLCALC, TRIG, CHOLHDL, LDLDIRECT in the last 72 hours. No results for input(s): TSH, T4TOTAL, T3FREE, THYROIDAB in the last 72 hours.  Invalid input(s): FREET3 No results for input(s): VITAMINB12, FOLATE, FERRITIN, TIBC, IRON, RETICCTPCT in the last 72 hours. Coags: No results for input(s): INR in the last 72 hours.  Invalid input(s): PT Microbiology: Recent Results (from the past 240 hour(s))  Urine culture     Status: None   Collection Time: 03/30/14 10:47 PM  Result Value Ref Range Status   Specimen Description URINE, CLEAN CATCH  Final   Special Requests NONE  Final   Culture  Setup Time   Final    03/31/2014 10:20 Performed at [facility]   Final    >=100,000 COLONIES/ML Performed at [facility] Performed at [facility]    Report Status 04/02/2014 FINAL  Final   Organism ID, Bacteria ESCHERICHIA COLI  Final      Susceptibility   Escherichia coli - MIC*    AMPICILLIN <=2 SENSITIVE Sensitive  CEFAZOLIN <=4 SENSITIVE Sensitive     CEFTRIAXONE <=1 SENSITIVE Sensitive     CIPROFLOXACIN <=0.25 SENSITIVE Sensitive     GENTAMICIN <=1 SENSITIVE Sensitive     LEVOFLOXACIN <=0.12 SENSITIVE Sensitive     NITROFURANTOIN <=16 SENSITIVE Sensitive     TOBRAMYCIN <=1 SENSITIVE Sensitive     TRIMETH/SULFA <=20 SENSITIVE Sensitive     PIP/TAZO <=4 SENSITIVE Sensitive     * ESCHERICHIA COLI  Culture, blood (routine x 2)     Status: None   Collection Time: 03/31/14 12:06 AM  Result Value Ref Range Status   Specimen Description BLOOD LEFT ANTECUBITAL  Final   Special Requests BOTTLES DRAWN AEROBIC AND ANAEROBIC 5CC EA  Final   Culture  Setup Time   Final    03/31/2014 10:18 Performed at [facility]   Final    NO GROWTH 5 DAYS Performed at [facility]    Report Status 04/06/2014 FINAL  Final  Culture, blood (routine x 2)     Status: None   Collection Time: 03/31/14 12:12 AM  Result Value Ref Range Status   Specimen Description BLOOD LEFT WRIST  Final   Special Requests BOTTLES DRAWN AEROBIC ONLY 8CC  Final   Culture  Setup Time   Final    03/31/2014 10:18 Performed at [facility]   Final    NO GROWTH 5 DAYS Performed at [facility]    Report Status 04/06/2014 FINAL  Final     BRIEF HOSPITAL COURSE:   Principal Problem:  Generalized Weakness with debility: Patient has chronic lower extremity weakness and usually walks with the help of a walker/cane all the time. He has chronic back pain, chronic right lower extremity pain secondary to RSD and has a fentanyl pump in place. He also has a spinal cord stimulator in place. Patient follows with pain management M.D. in [place]. Patient has had recent lumbar  decompressionfrom L4-S1 with foraminotomy and facetectomy right side performed by Dr. Rolena Infante in September of 2015. He was recently admitted for sepsis from UTI, and discharged home with home health services on 12/23. He returned to the hospital on 12/28 for worsening weakness. He was admitted, and seen by physical therapy and provided supportive care. By day of discharge, he felt significantly better, was able to stand up and ambulate a few steps with the help of therapy. He does not have any focal neurological deficits on exam, motor exam of the lower extremity reveals approximately 4/5 strength bilaterally. No sensory deficits, reflexes are preserved. Suspect worsening acute on chronic weakness is secondary to recent infection/hospitalization. Current recommendations are to transfer to skilled nursing facility for subacute rehabilitation. Family and spouse are agreeable.  Active Problems:   Recent history of sepsis secondary to UTI: Completely resolved, has completed antibiotic treatment. No need for further antibiotics.    Hypertension: Controlled with clonidine. Continue on discharge.    Uncontrolled type 2 diabetes: Continue metformin, Amaryl and Januvia on discharge. Since patient continues to have CBGs in the 200/300s range, will continue with Lantus and SSI that was started during this hospitalization on discharge. Further optimization of this regimen to be done in the outpatient setting.    Right hip pain/right lower extremity pain/chronic back pain with history of  RSD (reflex sympathetic dystrophy): Sees pain management M.D. at [facility]. As noted above, has spinal cord stimulator and a fentanyl pump in place.Patient will need to follow with pain management M.D.  in [place] by 04/11/13 to replace fentanyl. Family willing to provide transportation from SNF to pain management clinic and back.    Right wrist pain: Seen by Dr. 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 Recommendations are for a thumb splint and anti-inflammatories. Follow up with Dr Fredna Dow  TODAY-DAY OF DISCHARGE:  Subjective:   James Parsons today has no headache,no chest abdominal pain,no new weakness tingling or numbness, feels much better.   Objective:   Blood pressure 132/54, pulse 105, temperature 98 F (36.7 C), temperature source Oral, resp. rate 20, height 5' 9.02" (1.753 m), weight 100.245 kg (221 lb), SpO2 95 %.  Intake/Output Summary (Last 24 hours) at 04/09/14 1109 Last data filed at 04/09/14 0900  Gross per 24 hour  Intake    720 ml  Output    900 ml  Net   -180 ml   Filed Weights   04/09/14 0100  Weight: 100.245 kg (221 lb)    Exam Awake Alert, Oriented *3, No new F.N deficits, Normal affect [place].AT,PERRAL Supple Neck,No JVD, No cervical lymphadenopathy appriciated.  Symmetrical Chest wall movement, Good air movement bilaterally, CTAB RRR,No Gallops,Rubs or new Murmurs, No Parasternal Heave +ve B.Sounds, Abd Soft, Non tender, No organomegaly appriciated, No rebound -guarding or rigidity. No Cyanosis, Clubbing or edema, No new Rash or bruise  DISCHARGE CONDITION: Stable  DISPOSITION: SNF  DISCHARGE INSTRUCTIONS:    Activity:  As tolerated with Full fall precautions use walker/cane & assistance as needed  Diet recommendation: Diabetic Diet Heart Healthy diet  Discharge Instructions    Call MD for:  severe uncontrolled pain    Complete by:  As directed      Diet - low sodium heart healthy    Complete by:  As directed      Diet Carb Modified    Complete by:  As directed      Increase activity slowly    Complete by:  As directed            Follow-up Information    Follow up with Alesia Richards, MD. Schedule an appointment as soon as possible for a visit in 1 week.   Specialty:  Internal Medicine   Contact information:   [address] [place]  [address] [phone]       Please follow up.   Why:  make appt by 04/11/14 for refill of fentanyl pump   Contact information:   Pain Management at [facility]      Follow up with Tennis Must, MD. Schedule an appointment as soon as possible for a visit in 2 weeks.   Specialty:  Orthopedic Surgery   Contact information:   [place] [address] [phone]        Total Time spent on discharge equals 45 minutes.  SignedOren Binet 04/09/2014 11:09 AM

## 2014-04-09 NOTE — Progress Notes (Signed)
James Parsons to be D/C'd Home per MD order. Discussed with the patient and all questions fully answered.    Medication List    STOP taking these medications        ciprofloxacin 500 MG tablet  Commonly known as:  CIPRO      TAKE these medications        celecoxib 200 MG capsule  Commonly known as:  CELEBREX  Take 200 mg by mouth 2 (two) times daily.     cloNIDine 0.1 MG tablet  Commonly known as:  CATAPRES  Take 0.1 mg by mouth 2 (two) times daily.     cyclobenzaprine 10 MG tablet  Commonly known as:  FLEXERIL  Take 10 mg by mouth 3 (three) times daily as needed for muscle spasms. Muscle spasm.     DSS 100 MG Caps  Take 100 mg by mouth 2 (two) times daily.     DULoxetine 60 MG capsule  Commonly known as:  CYMBALTA  Take 60 mg by mouth daily.     fentaNYL 0.05 MG/ML SOLN 5,000 mcg  Inject into the skin continuous. Patient will bring dosage instructions with him. Daily dose 0.84mg      glimepiride 4 MG tablet  Commonly known as:  AMARYL  Take 4 mg by mouth daily with breakfast.     insulin aspart 100 UNIT/ML injection  Commonly known as:  novoLOG  - insulin aspart (novoLOG) injection 0-20 Units  - 0-20 Units, Subcutaneous, 3 times daily with meals  - CBG < 70: implement hypoglycemia protocol  - CBG 70 - 120: 0 units  - CBG 121 - 150: 3 units  - CBG 151 - 200: 4 units  - CBG 201 - 250: 7 units  - CBG 251 - 300: 11 units  - CBG 301 - 350: 15 units  - CBG 351 - 400: 20 units  - CBG > 400: call MD     insulin glargine 100 UNIT/ML injection  Commonly known as:  LANTUS  Inject 0.15 mLs (15 Units total) into the skin at bedtime.     lidocaine 5 %  Commonly known as:  LIDODERM  Place 1 patch onto the skin daily.     LYRICA 150 MG capsule  Generic drug:  pregabalin  Take 150 mg by mouth 2 (two) times daily.     metFORMIN 500 MG tablet  Commonly known as:  GLUCOPHAGE  Take 1,000 mg by mouth 2 (two) times daily with a meal.     polyethylene glycol  packet  Commonly known as:  MIRALAX / GLYCOLAX  Take 17 g by mouth daily.     simvastatin 40 MG tablet  Commonly known as:  ZOCOR  Take 40 mg by mouth daily.     sitaGLIPtin 100 MG tablet  Commonly known as:  JANUVIA  Take 50 mg by mouth daily.     tamsulosin 0.4 MG Caps capsule  Commonly known as:  FLOMAX  Take 0.4 mg by mouth daily.     traMADol 50 MG tablet  Commonly known as:  ULTRAM  Take 1 tablet (50 mg total) by mouth every 6 (six) hours as needed for moderate pain. pain     Vitamin D-3 5000 UNITS Tabs  Take 5,000 Units by mouth 2 (two) times daily.        VVS, Skin clean, dry and intact without evidence of skin break down, no evidence of skin tears noted.  IV catheter discontinued intact. Site without signs  and symptoms of complications. Dressing and pressure applied.  An After Visit Summary was printed and given to the patient.  Patient escorted stretcher, and D/C home via [facility] transport.  Cyndra Numbers  04/09/2014 6:52 PM

## 2014-04-09 NOTE — Progress Notes (Signed)
Physical Therapy Treatment Patient Details Name: James Parsons MRN: [id number] DOB: 1945-12-24 Today's Date: 04/09/2014    History of Present Illness With past medical history of COPD, RSD with chronic pain and uncontrolled diabetes mellitus who was admitted last week following a fall which left him with weakness. Patient was to go home with home health, however due to scheduling issues possibly from holidays, home health was not able to meet the patient. Since that time was grown progressively weaker to the point where he could not even stand at this point.    PT Comments    Pt participates well with mobility/transfer training and is very motivated to get well; he continues with functional deficits, bil LE and UE weakness, though has shown improvements between today's session and yesterday's session, which highlights his good rehab potential;   At this point, the only logical dc option given his functional deficits and good potential for functional improvement, coupled with the fact that he is home alone for large portions of the day, is for post-acute rehab to maximize independence and safety with mobility prior to dc home;   However as pt's insurance has denied authorization for SNF, it seems home is the only option -- given that, I have updated my recs for equipment and services in the home as outlined below, and updated our Case Manager (and greatly appreciate her help!)   Follow Up Recommendations  SNF;Supervision/Assistance - 24 hour  If pt MUST dc home, would recommend HHOT/PT/Aide/SW     Equipment Recommendations  Wheelchair (measurements PT);Wheelchair cushion (measurements PT);Hospital bed;3in1 (PT) (Drop-arm 3in1, sliding board)  Have also encouraged pt and wife to look into installing a ramp   Recommendations for Other Services       Precautions / Restrictions Precautions Precautions: Fall Precaution Comments: Multiple falls at home    Mobility  Bed  Mobility Overal bed mobility: Needs Assistance Bed Mobility: Rolling;Sidelying to Sit Rolling: Min guard Sidelying to sit: Min assist       General bed mobility comments: minguard assist with rolling; used rails, noted tendency to extend hips; min assist with sidelying to sit; extensive cues for technique, and support given at shoulder and pelvic girdles  Transfers Overall transfer level: Needs assistance Equipment used: Rolling walker (2 wheeled) (and using sliding board) Transfers: Lateral/Scoot Transfers;Sit to/from Stand Sit to Stand: Mod assist        Lateral/Scoot Transfers: Min assist General transfer comment: First worked on [employer] transfers, working to give pt and wife options to be able to manage at home, even if at a wheelchair level; Cues for technique, and min assist mostly to hold sliding board still; Close guard to prevent fall forward, given the need for anterior weight shift onto feet to unweigh hips for scooting; Then worked on sit<>stand transfers, which pt required mod assist for lift and had heavy dependence on momentum to successfully stand; performed serial sit<>stands X5, noted good push off with armrests  Ambulation/Gait Ambulation/Gait assistance: Min guard;+2 safety/equipment (second person pushing chair behind) Ambulation Distance (Feet): 20 Feet (5+15) Assistive device: Rolling walker (2 wheeled) Gait Pattern/deviations: Trunk flexed;Shuffle Gait velocity: Decreased Gait velocity interpretation: <1.8 ft/sec, indicative of risk for recurrent falls General Gait Details: Cues to self-monitor for activity tolerance and chair pushed behind for safety due to risk of falls; noted decr ability to use R hand on RW; unstable in standing   Stairs Stairs:  (discussed wheelchair and going up and down 1 step with potentially their son's help  to get to Pain clinic appt tomorrow)          Wheelchair Mobility    Modified Rankin (Stroke Patients Only)        Balance Overall balance assessment: Needs assistance Sitting-balance support: Bilateral upper extremity supported Sitting balance-Leahy Scale: Poor (approaching Fair) Sitting balance - Comments: Needing steadying assist at times in sitting   Standing balance support: Bilateral upper extremity supported Standing balance-Leahy Scale: Poor Standing balance comment: Requires UE support                    Cognition Arousal/Alertness: Awake/alert Behavior During Therapy: WFL for tasks assessed/performed Overall Cognitive Status: History of cognitive impairments - at baseline       Memory: Decreased short-term memory (wife reports confusion, memory deficits x 1 year)              Exercises Other Exercises Other Exercises: performed serial sit<>stands for strengthening    General Comments General comments (skin integrity, edema, etc.): incontinent of urine in bed upon arrival      Pertinent Vitals/Pain Pain Assessment: Faces Faces Pain Scale: Hurts little more Pain Location: R hand Pain Descriptors / Indicators: Aching Pain Intervention(s): Monitored during session;Repositioned    Home Living                      Prior Function            PT Goals (current goals can now be found in the care plan section) Acute Rehab PT Goals Patient Stated Goal: to get stronger PT Goal Formulation: With patient/family Time For Goal Achievement: 04/22/14 Potential to Achieve Goals: Good Progress towards PT goals: Progressing toward goals    Frequency  Min 3X/week    PT Plan Discharge plan needs to be updated    Co-evaluation             End of Session Equipment Utilized During Treatment: Gait belt Activity Tolerance: Patient tolerated treatment well Patient left: in chair;with call bell/phone within reach;with chair alarm set;with family/visitor present     Time: 9476-5465 PT Time Calculation (min) (ACUTE ONLY): 42 min  Charges:  $Gait  Training: 8-22 mins $Therapeutic Activity: 23-37 mins                    G CodesQuin Hoop 04/09/2014, 4:05 PM  Roney Marion, [place] Pager [phone] Office [phone]

## 2014-04-09 NOTE — Progress Notes (Signed)
CARE MANAGEMENT NOTE 04/09/2014  Patient:  JAMIERE, GULAS   Account Number:  [id number]  Date Initiated:  04/09/2014  Documentation initiated by:  [facility]  Subjective/Objective Assessment:   bilateral lower extremity weakness     Action/Plan:   PT/OT evals- recommended SNF, insurance denied SNF coverage, wife and patient stated unable to afford, chose to go home with [facility]   Anticipated DC Date:  04/09/2014   Anticipated DC Plan:  [place]  In-house referral  Clinical Social Worker      DC Forensic scientist  CM consult      PAC Choice  [place]   Choice offered to / List presented to:  C-3 Spouse   DME arranged  3-N-1  [place]      DME agency  [employer]     [place] arranged  HH-1 RN  [place]      [place] agency  [facility]   Status of service:  Completed, signed off Medicare Important Message given?   (If response is "NO", the following Medicare IM given date fields will be blank) Date Medicare IM given:   Medicare IM given by:   Date Additional Medicare IM given:   Additional Medicare IM given by:    Discharge Disposition:  [place]  Per UR Regulation:  Reviewed for med. necessity/level of care/duration of stay  If discussed at [place] of Stay Meetings, dates discussed:    Comments:  04/09/14 Spoke with wife about [place], she chose [facility]. Contacted Stanton Kidney at [facility] and set up [facility], [place], [place] SW and aide. Gave patirent's wife a list of private duty agencies as well. Contacted Ovid Curd with [employer] and they will deliver a wheelchair, 3N1 with drop arm and a transfer board to patient's home on 04/10/14 in the am per the wife's request.Patient to be transported home via [place]. Fuller Plan RN, BSN, CCM

## 2014-04-09 NOTE — Clinical Social Work Note (Signed)
CSW met with patient and wife at bedside to review bed offers.  Patient and wife chooses [facility] as their first choice, [facility] is not contracted with their insurance ([employer] Medicare- not [employer]).  [facility] was able to offer a bed and has initiated insurance authorization.   DC pending: insurance authorization and completion of SNF admission paperwork  Gina , LCSWA [phone]  Psychiatric & Orthopedics (5N 1-16) Clinical Social Worker   

## 2014-04-09 NOTE — Clinical Social Work Note (Signed)
[employer] has denied SNF placement for patient stating custodial care is not a covered service provided by insurance.  Patient and wife were updated and CSW along with RNCM reviewed other disposition options.  Patient and wife are agreeable to dc home with home health services.  MD aware and agreeable to such home health needs/services.  RNCM now consulted and will assist with home health needs.  Patient will need PTAR transportation home.  CSW has placed PTAR information on chart and has confirmed the home address with the wife.    RN to call after-hours transportation service at [phone] once patient is ready for discharge (approx 5:00-6:00pm)  Nonnie Done, [place] [phone]  Psychiatric & Orthopedics (5N 1-16) Clinical Social Worker

## 2014-04-09 NOTE — Clinical Social Work Placement (Addendum)
Clinical Social Work Department CLINICAL SOCIAL WORK PLACEMENT NOTE 04/09/2014  Patient:  James Parsons, James Parsons  Account Number:  [id number] Admit date:  04/07/2014  Clinical Social Worker:  Wylene Men  Date/time:  04/08/2014 10:57 AM  Clinical Social Work is seeking post-discharge placement for this patient at the following level of care:   SKILLED NURSING   (*CSW will update this form in Epic as items are completed)   04/08/2014  Patient/family provided with [place] Department of Clinical Social Work's list of facilities offering this level of care within the geographic area requested by the patient (or if unable, by the patient's family).  04/08/2014  Patient/family informed of their freedom to choose among providers that offer the needed level of care, that participate in Medicare, Medicaid or managed care program needed by the patient, have an available bed and are willing to accept the patient.  04/08/2014  Patient/family informed of [facility]' ownership interest in [facility], as well as of the fact that they are under no obligation to receive care at this facility.  PASARR submitted to EDS on  PASARR number received on   FL2 transmitted to all facilities in geographic area requested by pt/family on  04/08/2014 FL2 transmitted to all facilities within larger geographic area on   Patient informed that his/her managed care company has contracts with or will negotiate with  certain facilities, including the following:     Patient/family informed of bed offers received:  04/09/2014 Patient chooses bed at [facility] denied SNF placement patient going home with [facility] Physician recommends and patient chooses bed at    Patient to be transferred to [facility] denied SNF placement patient going home with [facility] on  04/09/2014 Patient to be transferred to facility by wife Patient and family notified of transfer on 04/09/2014 Name of family member notified:  wife [name] and  patient  The following physician request were entered in Epic:   Additional Comments:  Nonnie Done, [place] [phone]  Psychiatric & Orthopedics (5N 1-16) Clinical Social Worker

## 2014-04-09 NOTE — Clinical Social Work Psychosocial (Signed)
Clinical Social Work Department BRIEF PSYCHOSOCIAL ASSESSMENT 04/09/2014  Patient:  James Parsons, James Parsons     Account Number:  [id number]     Admit date:  04/07/2014  Clinical Social Worker:  Wylene Men  Date/Time:  04/08/2014 10:42 AM  Referred by:  Physician  Date Referred:  04/08/2014 Referred for  SNF Placement  Psychosocial assessment   Other Referral:   none   Interview type:  Other - See comment Other interview type:   patient and patient's wife, James Parsons    PSYCHOSOCIAL DATA Living Status:  WIFE Admitted from facility:   Level of care:   Primary support name:  James Parsons Primary support relationship to patient:  SPOUSE Degree of support available:   strong    CURRENT CONCERNS Current Concerns  Post-Acute Placement   Other Concerns:    SOCIAL WORK ASSESSMENT / PLAN CSW assessed patient at bedside.  Patient was alert and oriented x4.  Wife, James Parsons remained at bedside throughout the assessment per patient request.  Patient is from home with wife prior to hospitalization.  PT recommends SNF at time of discharge.  Patient and wife are agreeable. Patient has been to [facility] in the past.  Patient and wife are requesting [place] SN[facility] at time of discharge.  [facility] is not in network with [employer] (patient's insurance).  Patient and wife are a ware and choose to [place] at time of dc.  [facility] has confirmed bed availability.  Patient has a  fentanyl pump which will expire on Friday.  Wife is attempting to schedule a refill appointment (provider is in [place]) on Thursday.  [place] today pending insurance authorization and completion of admission paperwork.  Wife works full-time and is main caregiver for patient.  Patient is hopeful to return to self-care once completed STR.   Assessment/plan status:  Psychosocial Support/Ongoing Assessment of Needs Other assessment/ plan:   PASARR-existing  FL2  admission paperwork- [facility]   Information/referral to community  resources:   SNF/STR    PATIENT'S/FAMILY'S RESPONSE TO PLAN OF CARE: Patient is agreeable to SNF/STR and will be discharged to [facility].       Nonnie Done, [place] [phone]  Psychiatric & Orthopedics (5N 1-16) Clinical Social Worker

## 2014-04-10 ENCOUNTER — Telehealth: Payer: Self-pay | Admitting: *Deleted

## 2014-04-10 NOTE — Telephone Encounter (Signed)
Patient called and states his glucose levels have been as high 531 and have not been under 300.  Per Dr [name], increase Lantus to 25 units at bedtime and do not use the Novolog sliding scale.  Also, check the BS level 2 times a day, prior to breakfast and prior to supper.  If glucose is above 200, inject 5 units of Lantus at that time and call to schedule OV next week.  Patient and spouse aware.

## 2014-04-11 ENCOUNTER — Encounter (INDEPENDENT_AMBULATORY_CARE_PROVIDER_SITE_OTHER): Payer: Self-pay | Admitting: Neurological Surgery

## 2014-04-11 DIAGNOSIS — E1165 Type 2 diabetes mellitus with hyperglycemia: Secondary | ICD-10-CM

## 2014-04-11 DIAGNOSIS — G8929 Other chronic pain: Secondary | ICD-10-CM

## 2014-04-11 DIAGNOSIS — N390 Urinary tract infection, site not specified: Secondary | ICD-10-CM

## 2014-04-11 DIAGNOSIS — F329 Major depressive disorder, single episode, unspecified: Secondary | ICD-10-CM

## 2014-04-12 ENCOUNTER — Encounter (HOSPITAL_COMMUNITY): Payer: Self-pay | Admitting: *Deleted

## 2014-04-12 ENCOUNTER — Emergency Department (HOSPITAL_COMMUNITY)
Admission: EM | Admit: 2014-04-12 | Discharge: 2014-04-12 | Disposition: A | Discharge status: Home / Self Care | Payer: Medicare PPO | Attending: Emergency Medicine | Admitting: Emergency Medicine

## 2014-04-12 DIAGNOSIS — S129XXA Fracture of neck, unspecified, initial encounter: Secondary | ICD-10-CM | POA: Insufficient documentation

## 2014-04-12 DIAGNOSIS — I609 Nontraumatic subarachnoid hemorrhage, unspecified: Secondary | ICD-10-CM | POA: Insufficient documentation

## 2014-04-12 DIAGNOSIS — M1990 Unspecified osteoarthritis, unspecified site: Secondary | ICD-10-CM | POA: Diagnosis not present

## 2014-04-12 DIAGNOSIS — Z8679 Personal history of other diseases of the circulatory system: Secondary | ICD-10-CM | POA: Diagnosis not present

## 2014-04-12 DIAGNOSIS — G8929 Other chronic pain: Secondary | ICD-10-CM | POA: Diagnosis not present

## 2014-04-12 DIAGNOSIS — B372 Candidiasis of skin and nail: Secondary | ICD-10-CM | POA: Insufficient documentation

## 2014-04-12 DIAGNOSIS — N390 Urinary tract infection, site not specified: Secondary | ICD-10-CM

## 2014-04-12 DIAGNOSIS — Z79891 Long term (current) use of opiate analgesic: Secondary | ICD-10-CM | POA: Diagnosis not present

## 2014-04-12 DIAGNOSIS — M6281 Muscle weakness (generalized): Secondary | ICD-10-CM | POA: Insufficient documentation

## 2014-04-12 DIAGNOSIS — E785 Hyperlipidemia, unspecified: Secondary | ICD-10-CM | POA: Insufficient documentation

## 2014-04-12 DIAGNOSIS — Z8719 Personal history of other diseases of the digestive system: Secondary | ICD-10-CM | POA: Insufficient documentation

## 2014-04-12 DIAGNOSIS — E1165 Type 2 diabetes mellitus with hyperglycemia: Secondary | ICD-10-CM | POA: Insufficient documentation

## 2014-04-12 DIAGNOSIS — Z794 Long term (current) use of insulin: Secondary | ICD-10-CM | POA: Insufficient documentation

## 2014-04-12 DIAGNOSIS — M545 Low back pain: Secondary | ICD-10-CM | POA: Insufficient documentation

## 2014-04-12 DIAGNOSIS — M79671 Pain in right foot: Secondary | ICD-10-CM | POA: Diagnosis not present

## 2014-04-12 DIAGNOSIS — E669 Obesity, unspecified: Secondary | ICD-10-CM | POA: Diagnosis not present

## 2014-04-12 DIAGNOSIS — Z86010 Personal history of colonic polyps: Secondary | ICD-10-CM | POA: Diagnosis not present

## 2014-04-12 DIAGNOSIS — Z791 Long term (current) use of non-steroidal anti-inflammatories (NSAID): Secondary | ICD-10-CM | POA: Diagnosis not present

## 2014-04-12 DIAGNOSIS — Z8701 Personal history of pneumonia (recurrent): Secondary | ICD-10-CM | POA: Diagnosis not present

## 2014-04-12 DIAGNOSIS — M79641 Pain in right hand: Secondary | ICD-10-CM | POA: Diagnosis not present

## 2014-04-12 DIAGNOSIS — Z79899 Other long term (current) drug therapy: Secondary | ICD-10-CM | POA: Diagnosis not present

## 2014-04-12 DIAGNOSIS — K5909 Other constipation: Secondary | ICD-10-CM | POA: Insufficient documentation

## 2014-04-12 DIAGNOSIS — J449 Chronic obstructive pulmonary disease, unspecified: Secondary | ICD-10-CM | POA: Diagnosis not present

## 2014-04-12 DIAGNOSIS — G5793 Unspecified mononeuropathy of bilateral lower limbs: Secondary | ICD-10-CM

## 2014-04-12 DIAGNOSIS — Z87891 Personal history of nicotine dependence: Secondary | ICD-10-CM | POA: Diagnosis not present

## 2014-04-12 DIAGNOSIS — F329 Major depressive disorder, single episode, unspecified: Secondary | ICD-10-CM | POA: Insufficient documentation

## 2014-04-12 DIAGNOSIS — M542 Cervicalgia: Secondary | ICD-10-CM | POA: Diagnosis not present

## 2014-04-12 DIAGNOSIS — M79672 Pain in left foot: Secondary | ICD-10-CM | POA: Diagnosis not present

## 2014-04-12 DIAGNOSIS — Z862 Personal history of diseases of the blood and blood-forming organs and certain disorders involving the immune mechanism: Secondary | ICD-10-CM | POA: Insufficient documentation

## 2014-04-12 DIAGNOSIS — R202 Paresthesia of skin: Secondary | ICD-10-CM | POA: Diagnosis not present

## 2014-04-12 DIAGNOSIS — R531 Weakness: Secondary | ICD-10-CM

## 2014-04-12 DIAGNOSIS — M792 Neuralgia and neuritis, unspecified: Secondary | ICD-10-CM | POA: Diagnosis not present

## 2014-04-12 DIAGNOSIS — M79642 Pain in left hand: Secondary | ICD-10-CM | POA: Diagnosis present

## 2014-04-12 DIAGNOSIS — B3789 Other sites of candidiasis: Secondary | ICD-10-CM

## 2014-04-12 LAB — URINALYSIS, ROUTINE W REFLEX MICROSCOPIC
BILIRUBIN URINE: NEGATIVE
Glucose, UA: 500 mg/dL — AB
Hgb urine dipstick: NEGATIVE
Ketones, ur: 15 mg/dL — AB
Nitrite: NEGATIVE
PH: 5.5 (ref 5.0–8.0)
Protein, ur: NEGATIVE mg/dL
Specific Gravity, Urine: 1.017 (ref 1.005–1.030)
UROBILINOGEN UA: 0.2 mg/dL (ref 0.0–1.0)

## 2014-04-12 LAB — COMPREHENSIVE METABOLIC PANEL
ALT: 34 U/L (ref 0–53)
AST: 23 U/L (ref 0–37)
Albumin: 2.4 g/dL — ABNORMAL LOW (ref 3.5–5.2)
Alkaline Phosphatase: 149 U/L — ABNORMAL HIGH (ref 39–117)
Anion gap: 13 (ref 5–15)
BUN: 12 mg/dL (ref 6–23)
CO2: 25 mmol/L (ref 19–32)
Calcium: 9.2 mg/dL (ref 8.4–10.5)
Chloride: 92 mEq/L — ABNORMAL LOW (ref 96–112)
Creatinine, Ser: 0.47 mg/dL — ABNORMAL LOW (ref 0.50–1.35)
Glucose, Bld: 292 mg/dL — ABNORMAL HIGH (ref 70–99)
POTASSIUM: 4.2 mmol/L (ref 3.5–5.1)
Sodium: 130 mmol/L — ABNORMAL LOW (ref 135–145)
Total Bilirubin: 0.3 mg/dL (ref 0.3–1.2)
Total Protein: 7.6 g/dL (ref 6.0–8.3)

## 2014-04-12 LAB — URINE MICROSCOPIC-ADD ON

## 2014-04-12 LAB — CBC WITH DIFFERENTIAL/PLATELET
Basophils Absolute: 0 10*3/uL (ref 0.0–0.1)
Basophils Relative: 0 % (ref 0–1)
Eosinophils Absolute: 0.1 10*3/uL (ref 0.0–0.7)
Eosinophils Relative: 1 % (ref 0–5)
HCT: 38.9 % — ABNORMAL LOW (ref 39.0–52.0)
HEMOGLOBIN: 13 g/dL (ref 13.0–17.0)
Lymphocytes Relative: 18 % (ref 12–46)
Lymphs Abs: 2 10*3/uL (ref 0.7–4.0)
MCH: 27.3 pg (ref 26.0–34.0)
MCHC: 33.4 g/dL (ref 30.0–36.0)
MCV: 81.7 fL (ref 78.0–100.0)
Monocytes Absolute: 0.7 10*3/uL (ref 0.1–1.0)
Monocytes Relative: 6 % (ref 3–12)
NEUTROS PCT: 75 % (ref 43–77)
Neutro Abs: 7.9 10*3/uL — ABNORMAL HIGH (ref 1.7–7.7)
PLATELETS: 392 10*3/uL (ref 150–400)
RBC: 4.76 MIL/uL (ref 4.22–5.81)
RDW: 15 % (ref 11.5–15.5)
WBC: 10.6 10*3/uL — ABNORMAL HIGH (ref 4.0–10.5)

## 2014-04-12 LAB — LIPASE, BLOOD: Lipase: 25 U/L (ref 11–59)

## 2014-04-12 LAB — I-STAT TROPONIN, ED: TROPONIN I, POC: 0 ng/mL (ref 0.00–0.08)

## 2014-04-12 LAB — CBG MONITORING, ED
GLUCOSE-CAPILLARY: 246 mg/dL — AB (ref 70–99)
Glucose-Capillary: 262 mg/dL — ABNORMAL HIGH (ref 70–99)

## 2014-04-12 LAB — AMMONIA: Ammonia: 17 umol/L (ref 11–32)

## 2014-04-12 LAB — KETONES, QUALITATIVE: ACETONE BLD: NEGATIVE

## 2014-04-12 LAB — MAGNESIUM: MAGNESIUM: 1.8 mg/dL (ref 1.5–2.5)

## 2014-04-12 MED ORDER — SODIUM CHLORIDE 0.9 % IV BOLUS (SEPSIS)
1000.0000 mL | Freq: Once | INTRAVENOUS | Status: AC
  Administered 2014-04-12: 1000 mL via INTRAVENOUS

## 2014-04-12 MED ORDER — NITROFURANTOIN MONOHYD MACRO 100 MG PO CAPS: 100.0000 mg | ORAL_CAPSULE | Freq: Two times a day (BID) | ORAL | Status: DC

## 2014-04-12 MED ORDER — INSULIN ASPART 100 UNIT/ML ~~LOC~~ SOLN
7.0000 [IU] | Freq: Once | SUBCUTANEOUS | Status: AC
  Administered 2014-04-12: 7 [IU] via SUBCUTANEOUS
  Filled 2014-04-12: qty 1

## 2014-04-12 MED ORDER — NYSTATIN 100000 UNIT/GM EX POWD: CUTANEOUS | Status: DC

## 2014-04-12 MED ORDER — FENTANYL CITRATE 0.05 MG/ML IJ SOLN
50.0000 ug | Freq: Once | INTRAMUSCULAR | Status: AC
  Administered 2014-04-12: 50 ug via INTRAVENOUS
  Filled 2014-04-12: qty 2

## 2014-04-12 NOTE — ED Provider Notes (Signed)
CSN: [id number]     Arrival date & time 04/12/14  1127 History   First MD Initiated Contact with Patient 04/12/14 1346     Chief Complaint  Patient presents with  . Pain  . Hyperglycemia     (Consider location/radiation/quality/duration/timing/severity/associated sxs/prior Treatment) HPI Comments: James Parsons is a 68 y.o. 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 His wife accompanies him and helps provide some of the HPI. Pt was discharged on 04/09/14 unexplained pains and generalized weakness, he recommended that he be discharged to a rehabilitation center but his insurance did not cover this, therefore he was discharged to the care of his wife with home health assistance. He has chronic pain for which he has a fentanyl pump, which was refilled on 04/11/14. His wife reports that this morning, he was having worsening pain in his hands and feet, which he states is 10/10 "pins and needles" constant nonradiating pain worse with touch or pressure to his hands and feet, and unrelieved with oxycodone and Flexeril, as well as the fentanyl pump. His wife also reports that she noticed what looks like a yeast rash in the intertriginous folds bilaterally in his genital region, which she has been trying to keep dry and apply powder. In the last several weeks, they have noticed increase in urinary urgency and frequency, and he was treated for a UTI during his last admission, but is no longer undergoing any antibiotic care. His wife states that this morning she noticed his urine has become malodorous and slightly darker. They took his blood sugar at home, and noticed that it was running in the 280s to 350s. She  states that she didn't realize he was supposed to get Novolin and Lantus when he was discharged, therefore he has not had these since discharge, but he continues taking his other oral antihyperglycemic regimen (januvia, amaryl, metformin) which he took this morning. He has chronic constipation, no BM since Tuesday, but he is supposed to be on stool softeners and hasn't started these yet. Patient denies fevers, chills, chest pain, shortness breath, headache, dizziness, vision changes, abdominal pain, nausea, vomiting, diarrhea, numbness, focal weakness (has baseline chronic generalized weakness which hasn't changed). Denies EtOH or smoking use. Wife reports that the pt hasn't taken more than a few steps unassisted since his illness began on 03/29/14. He uses a wheelchair to mobilize. He also has chronic back and neck pain at baseline which hasn't changed. PCP Dr. McKeown  Patient is a 68 y.o. male presenting with hyperglycemia. The history is provided by the patient and the spouse. No language interpreter was used.  Hyperglycemia Blood sugar level PTA:  264 Severity:  Moderate Onset quality:  Gradual Duration:  2 days Timing:  Constant Progression:  Unchanged Chronicity:  Recurrent Diabetes status:  Controlled with oral medications Current diabetic therapy:  Januvia 50mg daily, metformin 1g BID, amaryl 4mg qAM, was supposed to be on novolin and lantus but has not taken these Time since last antidiabetic medication:  10 hours Context: change in medication (recent addition of lantus and novolin which he hasn't started)   Relieved by:  None tried Ineffective treatments:  None tried Associated symptoms: polyuria and weakness (ongoing generalized weakness, unchanged from   prior ED visits)   Associated symptoms: no abdominal pain, no altered mental status, no blurred vision, no chest pain, no confusion, no dizziness, no dysuria, no fever, no nausea, no shortness of breath, no syncope and no vomiting      Past Medical History  Diagnosis Date  . Anemia   . Arthritis   . Blood transfusion   . Clotting disorder   . Diabetes mellitus   . Borderline hypertension   . Venous insufficiency   . Hyperlipidemia   . Obesity   . Diverticulosis of colon   . Hx of colonic polyps   . Low back pain   . Lipoma   . RSD (reflex sympathetic dystrophy)   . Mental disorder   . Depression     hx of   . COPD (chronic obstructive pulmonary disease)     denies  . Pneumonia     hx   Past Surgical History  Procedure Laterality Date  . Back surgery  [id number]  . Morphine pump  2009    due to reaction to morphine/changed to fentanyl  . Excision of lipoma from right olecranon area  11/2000    Dr. Rise [name]  . Microdiscectomy and decompression  05/2002    Dr. Tonita Cong  . C3-4 anterior cervical discectomy and fusion with plating at c3-4  05/2006    Dr. Arnoldo Morale  . Spinal cord stimulator implanted      for pain per Dr. Maryruth Eve  . Subcut pain pump implanted    . Pain pump implantation      with fentanyl  . Tonsillectomy    . Colonoscopy w/ polypectomy    . Knee arthroscopy      right knee  . 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 Total knee arthroplasty  02/29/2012    Procedure: TOTAL KNEE ARTHROPLASTY;  Surgeon: Kerin Salen, MD;  Location: [facility];  Service: Orthopedics;  Laterality: Right;  . Lumbar laminectomy/decompression microdiscectomy N/A 01/08/2014    Procedure: L4-S1 Decompression with removal and reimplantation of spinal cord stimulator battery ;  Surgeon: Melina Schools, MD;  Location: [facility];  Service: Orthopedics;  Laterality: N/A;   Family History  Problem Relation Age of Onset  . Cancer Father   . Colon cancer Neg Hx   . Esophageal cancer Neg Hx   . Stomach cancer Neg Hx   . Rectal cancer Neg Hx    History  Substance Use Topics  . Smoking status: Former Smoker -- 3.00 packs/day for 40 years    Types: Cigarettes    Quit date: 05/25/2002  . Smokeless tobacco: Never  Used  . Alcohol Use: No    Review of Systems  Constitutional: Negative for fever and chills.  HENT: Negative for ear discharge, ear pain, sore throat and trouble swallowing.   Eyes: Negative for blurred vision, pain and discharge.  Respiratory: Negative for cough, chest tightness and shortness of breath.   Cardiovascular: Negative for chest pain, leg swelling and syncope.  Gastrointestinal: Positive for constipation (chronic and unchanged). Negative for nausea, vomiting, abdominal pain and diarrhea.  Endocrine: Positive for polyuria.  Genitourinary: Positive for urgency and frequency. Negative for dysuria, hematuria, flank pain, discharge and penile pain.  Musculoskeletal: Positive for back pain (chronic), gait problem (difficulty) and neck pain (chronic). Negative for myalgias, joint swelling and arthralgias.  Skin: Positive for rash (intertrigonous rash at genitals).  Allergic/Immunologic: Positive for immunocompromised state (diabetic).  Neurological: Negative for dizziness, weakness, light-headedness, numbness and  headaches.       +paresthesias hands/feet bilaterally  Psychiatric/Behavioral: Negative for confusion.   10 Systems reviewed and are negative for acute change except as noted in the HPI.    Allergies  Codeine and Morphine and related  Home Medications   Prior to Admission medications   Medication Sig Start Date End Date Taking? Authorizing Provider  celecoxib (CELEBREX) 200 MG capsule Take 200 mg by mouth 2 (two) times daily. 01/20/14   Historical Provider, MD  Cholecalciferol (VITAMIN D-3) 5000 UNITS TABS Take 5,000 Units by mouth 2 (two) times daily.    Historical Provider, MD  cloNIDine (CATAPRES) 0.1 MG tablet Take 0.1 mg by mouth 2 (two) times daily.    Historical Provider, MD  cyclobenzaprine (FLEXERIL) 10 MG tablet Take 10 mg by mouth 3 (three) times daily as needed for muscle spasms. Muscle spasm. 03/02/11   Historical Provider, MD  docusate sodium 100 MG CAPS  Take 100 mg by mouth 2 (two) times daily. 04/09/14   Shanker M Ghimire, MD  DULoxetine (CYMBALTA) 60 MG capsule Take 60 mg by mouth daily.  10/09/12   Historical Provider, MD  fentaNYL 0.05 MG/ML SOLN 5,000 mcg Inject into the skin continuous. Patient will bring dosage instructions with him. Daily dose 0.84mg    Historical Provider, MD  glimepiride (AMARYL) 4 MG tablet Take 4 mg by mouth daily with breakfast.  01/12/14   Historical Provider, MD  insulin aspart (NOVOLOG) 100 UNIT/ML injection insulin aspart (novoLOG) injection 0-20 Units 0-20 Units, Subcutaneous, 3 times daily with meals CBG < 70: implement hypoglycemia protocol CBG 70 - 120: 0 units CBG 121 - 150: 3 units CBG 151 - 200: 4 units CBG 201 - 250: 7 units CBG 251 - 300: 11 units CBG 301 - 350: 15 units CBG 351 - 400: 20 units CBG > 400: call MD 04/09/14   Shanker M Ghimire, MD  insulin glargine (LANTUS) 100 UNIT/ML injection Inject 0.15 mLs (15 Units total) into the skin at bedtime. 04/09/14   Shanker M Ghimire, MD  lidocaine (LIDODERM) 5 % Place 1 patch onto the skin daily. 04/09/14   Shanker M Ghimire, MD  LYRICA 150 MG capsule Take 150 mg by mouth 2 (two) times daily.  02/21/11   Historical Provider, MD  metFORMIN (GLUCOPHAGE) 500 MG tablet Take 1,000 mg by mouth 2 (two) times daily with a meal.    Historical Provider, MD  polyethylene glycol (MIRALAX / GLYCOLAX) packet Take 17 g by mouth daily. 04/09/14   Shanker M Ghimire, MD  simvastatin (ZOCOR) 40 MG tablet Take 40 mg by mouth daily.    Historical Provider, MD  sitaGLIPtin (JANUVIA) 100 MG tablet Take 50 mg by mouth daily. 11/21/13   William McKeown, MD  tamsulosin (FLOMAX) 0.4 MG CAPS capsule Take 0.4 mg by mouth daily.  05/07/13   Historical Provider, MD  traMADol (ULTRAM) 50 MG tablet Take 1 tablet (50 mg total) by mouth every 6 (six) hours as needed for moderate pain. pain 04/09/14   Shanker M Ghimire, MD   BP 114/76 mmHg  Pulse 113  Temp(Src) 98.6 F (37 C)  Resp 16   Ht 5' 9" (1.753 m)  Wt 200 lb (90.719 kg)  BMI 29.52 kg/m2  SpO2 95% Physical Exam  Constitutional: He is oriented to person, place, and time. He appears well-developed and well-nourished.  Non-toxic appearance. No distress.  Afebrile, nontoxic, slightly tachycardic which is consistent with prior ED visits, VSS  HENT:  Head: Normocephalic and   atraumatic.  Mouth/Throat: Oropharynx is clear and moist. Mucous membranes are dry.  Slightly dry mucous membranes  Eyes: Conjunctivae and EOM are normal. Pupils are equal, round, and reactive to light. Right eye exhibits no discharge. Left eye exhibits no discharge.  Neck: Normal range of motion. Neck supple. Muscular tenderness present. No spinous process tenderness present. No rigidity. Normal range of motion present.    FROM intact without spinous process TTP, no bony stepoffs or deformities, mild R sided paraspinous muscle TTP without muscle spasms. No rigidity or meningeal signs. No bruising or swelling.   Cardiovascular: Regular rhythm, normal heart sounds and intact distal pulses.  Tachycardia present.  Exam reveals no gallop and no friction rub.   No murmur heard. Distal pulses intact, cap refill brisk and present, slightly tachycardic c/w prior ED visits  Pulmonary/Chest: Effort normal and breath sounds normal. No respiratory distress. He has no decreased breath sounds. He has no wheezes. He has no rhonchi. He has no rales.  CTAB in all lung fields, no Kussmaul respirations  Abdominal: Soft. Normal appearance and bowel sounds are normal. He exhibits no distension. There is tenderness in the suprapubic area. There is no rigidity, no rebound, no guarding, no CVA tenderness, no tenderness at McBurney's point and negative Murphy's sign.    Soft, obese abdomen, ND, +BS throughout, with mild suprapubic TTP with no r/g/r, neg murphy's, neg mcburney's, no CVA TTP   Genitourinary:  Erythematous macerated rash to inguinal folds bilaterally   Musculoskeletal: Normal range of motion.  Strength 4/5 in all extremities, sensation grossly intact in all extremities, distal pulses intact in all extremities, MAE x4 Unable to ambulate at initial exam, due to pain  Neurological: He is alert and oriented to person, place, and time. He has normal strength. No cranial nerve deficit or sensory deficit. Gait (unable) abnormal. GCS eye subscore is 4. GCS verbal subscore is 5. GCS motor subscore is 6.  CN 2-12 grossly intact A&O x4 GCS 15 Sensation and strength intact Gait and coordination unable to be assessed due to pt discomfort  Skin: Skin is warm and dry. Rash noted.  Inguinal fold rash as discussed above  Psychiatric: He has a normal mood and affect.  Nursing note and vitals reviewed.   ED Course  Procedures (including critical care time) Labs Review Labs Reviewed  CBC WITH DIFFERENTIAL - Abnormal; Notable for the following:    WBC 10.6 (*)    HCT 38.9 (*)    Neutro Abs 7.9 (*)    All other components within normal limits  COMPREHENSIVE METABOLIC PANEL - Abnormal; Notable for the following:    Sodium 130 (*)    Chloride 92 (*)    Glucose, Bld 292 (*)    Creatinine, Ser 0.47 (*)    Albumin 2.4 (*)    Alkaline Phosphatase 149 (*)    All other components within normal limits  URINE CULTURE  LIPASE, BLOOD  MAGNESIUM  AMMONIA  URINALYSIS, ROUTINE W REFLEX MICROSCOPIC  KETONES, QUALITATIVE  I-STAT TROPOININ, ED    Imaging Review No results found. Dg Chest 2 View  03/31/2014   CLINICAL DATA:  Generalized weakness  EXAM: CHEST  2 VIEW  COMPARISON:  10/16/2013  FINDINGS: The heart size at upper limits of normal. Spinal stimulator noted. Both lungs are clear. The visualized skeletal structures are unremarkable but subjectively osteopenic.  IMPRESSION: No active cardiopulmonary disease.   Electronically Signed   By: Gretchen  Green M.D.   On: 03/31/2014 00:45   Dg   Wrist Complete Right  03/31/2014   CLINICAL DATA:  Acute right  lateral wrist pain and swelling since last night without trauma  EXAM: RIGHT WRIST - COMPLETE 3+ VIEW  COMPARISON:  None.  FINDINGS: There is no evidence of fracture or dislocation. There is no evidence of arthropathy or other focal bone abnormality. Soft tissues are unremarkable. Scapholunate distance at upper limits of normal measuring 3 mm. Degenerative change noted at the first carpometacarpal joint.  IMPRESSION: No acute osseous abnormality.  Scapholunate distance of 3 mm at upper limits of normal. Correlate for point tenderness to determine chronicity.   Electronically Signed   By: Conchita Paris M.D.   On: 03/31/2014 00:44   Dg Hip Complete Right  04/07/2014   CLINICAL DATA:  Status post fall 3 days ago with persistent right hip pain. ; history of chronic back pain with Fentanyl pump  EXAM: RIGHT HIP - COMPLETE 2+ VIEW  COMPARISON:  None.  FINDINGS: The bony pelvis is mildly osteopenic. There is no acute fracture of the pelvis. The observed portions of the sacrum are unremarkable. There are degenerative changes of the lower lumbar spine. A electronic device which presumably reflects the Fentanyl pump projects over the left iliac crest.  AP and lateral views of the right hip reveal the bones to be intact. The joint space is preserved. There is no significant degenerative change. The soft tissues are unremarkable.  IMPRESSION: There is no acute fracture nor dislocation of the right hip.   Electronically Signed   By: David  Martinique   On: 04/07/2014 09:03   Dg Knee 1-2 Views Left  03/31/2014   CLINICAL DATA:  Medial left knee pain.  Knee gave out 2 days ago.  EXAM: LEFT KNEE - 1-2 VIEW  COMPARISON:  None.  FINDINGS: No acute fracture or dislocation is identified. There is a small knee joint effusion. Medial and lateral compartment joint space widths appear preserved. No lytic or blastic osseous lesion is identified. No radiopaque foreign body.  IMPRESSION: Small knee joint effusion.  No acute osseous  abnormality identified.   Electronically Signed   By: Logan Bores   On: 03/31/2014 08:28   Ct Head Wo Contrast  03/31/2014   CLINICAL DATA:  Dizziness, fall  EXAM: CT HEAD WITHOUT CONTRAST  TECHNIQUE: Contiguous axial images were obtained from the base of the skull through the vertex without intravenous contrast.  COMPARISON:  10/16/2013  FINDINGS: Mild cortical volume loss noted with proportional ventricular prominence. Areas of periventricular white matter hypodensity are most compatible with small vessel ischemic change. No acute hemorrhage, infarct, or mass lesion is identified. No midline shift. No acute osseous abnormality. Orbits and paranasal sinuses are unremarkable.  IMPRESSION: No acute intracranial finding.   Electronically Signed   By: Conchita Paris M.D.   On: 03/31/2014 00:57   Ct Lumbar Spine Wo Contrast  03/31/2014   CLINICAL DATA:  68 year old male post fall yesterday with left leg pain. Spine stimulator device in place and unable to have MR. Initial encounter.  EXAM: CT LUMBAR SPINE WITHOUT CONTRAST  TECHNIQUE: Multidetector CT imaging of the lumbar spine was performed without intravenous contrast administration. Multiplanar CT image reconstructions were also generated.  COMPARISON:  Intraoperative lateral view of the lumbar spine 01/08/2014. Postmyelogram CT 09/04/2013.  FINDINGS: Last fully open disk space is labeled L5-S1. Present examination incorporates from T12-L1 disc space through the lower sacrum.  Atherosclerotic type changes of the abdominal aorta and involving branch vessels. No focal abdominal aortic aneurysm.  Mildly ectatic iliac arteries.  Stimulating device leads at the T12-L1, L1-2 and L2-3 disc space level directed superiorly similar to prior exam.  No acute fracture detected.  T12-L1: Facet joint degenerative changes and bony overgrowth greater on the right. Stimulating device leads is in place. Narrowing of the thecal sac greater on the right. Foraminal narrowing  greater on the right.  L1-2: Facet joint degenerative changes and bony overgrowth greater on the right. Superior aspect of the L2 facet extends into the neural foramen greater on right with severe right foraminal narrowing and moderate left foraminal narrowing. Disc osteophyte complex greater right lateral position. Stimulating device in place. Moderate spinal stenosis greater on the right.  L2-3: Facet joint degenerative changes and bony overgrowth. Ligamentum flavum hypertrophy partially calcified greater on the right. Disc osteophyte complex. Severe right-sided and moderate to marked left-sided spinal stenosis and lateral recess stenosis. Mild bilateral foraminal narrowing.  L3-4: Facet joint degenerative changes and bony overgrowth greater on the right. Disc osteophyte complex greater right paracentral position. Marked right-sided and moderate left-sided lateral recess stenosis and spinal stenosis. Foraminal extension of disc osteophyte on the right with moderate foraminal narrowing and encroachment upon the exiting right L3 nerve root. Mild to slightly moderate left foraminal narrowing.  L4-5: Prominent bilateral facet joint degenerative changes. Ligamentum flavum hypertrophy. Prior laminectomy. Bulge greater right foraminal position. Moderate to marked right-sided and moderate left foraminal narrowing. Small amount a gas within the narrowed left neural foramen may represent gas containing synovial cyst. Difficult to evaluate the thecal sac without intrathecal contrast given the degree of significant postoperative changes.  L5-S1: Facet joint degenerative changes. Bulge with osteophyte and foraminal extension contributing to moderate foraminal narrowing greater on the left. Post laminectomy. Difficult to evaluate the thecal sac without into thecal contrast.  IMPRESSION: Summary of pertinent findings includes:  T12-L1 multifactorial moderate narrowing of the thecal sac greater on the right. Foraminal narrowing  greater on the right.  L1-2 multifactorial severe right foraminal narrowing and moderate left foraminal narrowing. Moderate spinal stenosis greater on the right.  L2-3 multifactorial severe right-sided and moderate to marked left-sided spinal stenosis and lateral recess stenosis. Mild bilateral foraminal narrowing.  L3-4 multifactorial marked right-sided and moderate left-sided lateral recess stenosis and spinal stenosis. Multifactorial moderate right foraminal narrowing and encroachment upon the exiting right L3 nerve root. Mild to slightly moderate left foraminal narrowing.  L4-5 multifactorial moderate to marked right-sided and moderate left foraminal narrowing. Small amount a gas within the narrowed left neural foramen may represent gas containing synovial cyst. Post laminectomy. Difficult to evaluate the thecal sac without intrathecal contrast given the degree of significant postoperative changes.  L5-S1 multifactorial moderate foraminal narrowing greater on the left. Post laminectomy. Difficult to evaluate the thecal sac without into thecal contrast.  Please see above for further detail.   Electronically Signed   By: Chauncey Cruel M.D.   On: 03/31/2014 21:50      EKG Interpretation   Date/Time:  Saturday April 12 2014 15:51:26 EST Ventricular Rate:  97 PR Interval:  165 QRS Duration: 103 QT Interval:  369 QTC Calculation: 469 R Axis:   68 Text Interpretation:  Sinus rhythm No significant change since last  tracing Confirmed by YAO  MD, DAVID ([id number]) on 04/12/2014 3:58:58 PM      MDM   Final diagnoses:  Generalized weakness  Neuropathic pain of hand, unspecified laterality  Neuropathic pain of both feet  Hyperglycemia due to type 2 diabetes mellitus  Chronic pain  UTI (lower urinary tract infection)  Candida rash of groin    68 y.o. male with ongoing weakness/pain, appears to be neuropathic pain. Recently discharged, was supposed to go to rehab but insurance wouldn't cover it. Will  obtain labs, urine, EKG, trop, Mg, and give bolus then once CBG returns will see about giving insulin. Will hold on pain meds since pt just had home percocet and gets fentanyl. Likely neuropathic pain related to DM vs RSD, but differential diagnosis does include vitamin deficiency vs herniated disc vs worsening degenerative spinal changes (CT on 03/31/14 with multiple level degenerative changes and foramen stenosis) vs other neurologic etiology (CT head 03/31/14 neg). Discussed case with Dr. Yao who will see pt now. Will hold on giving any pain medications at this time given that pt just had oxycodone and has fentanyl pump. Will reassess shortly.  4:07 PM Trop neg. EKG unremarkable. CBC with mildly elevated WBC at 10.6 which is improved from prior admission 2 days ago. CMP showing Na 130 which corrects to 135 when corrected for hyperglycemia. Cl 92. Gluc 292. Alk Phos chronically elevated at 149. Bicarb WNL and no anion gap.  Lipase WNL. Mg WNL. Ketones pending. U/A pending. Will await to ensure pt isn't in DKA prior to giving insulin. Dr. Yao saw pt, added an ammonia level which was unremarkable, and gave 50mcg fentanyl. Will reassess after U/A and ketones return.   4:33 PM Ketones neg, U/A still pending. Will give get CBG now and give SQ novolin (per home dose from sliding scale).  4:57 PM CBG 246 after 1L bolus. Tachycardia improved, VSS. Will give 7U SQ novolog. U/A still pending. Case manager called to discuss any alternatives with pt regarding his home health nurse vs rehab placement. Pt's pain improved after fentanyl. Will reassess after urine returns.  5:30 PM U/A returning with small leuks, few squamous, 11-20 WBC, few bacteria. This could represent UTI, although pt declines dysuria but given recent UTI previously will treat for UTI. His prior UCx grew out E.coli which was sensitive to macrobid, and he was previously given cipro, will treat with macrobid today. Doubt need for admission since U/A  could be contaminated and at his last hospitalization his U/A appeared similar (11-20 WBC then as well). Will give nystatin powder for yeast infection in groin. Will have him see PCP in 2 days for recheck. Case manager saw pt and will assist with rehab/SNF placement as outpt since this was mentioned at his last hospitalization. Discussed good hydration and use of his home med regimen for DM. I explained the diagnosis and have given explicit precautions to return to the ER including for any other new or worsening symptoms. The patient understands and accepts the medical plan as it's been dictated and I have answered their questions. Discharge instructions concerning home care and prescriptions have been given. The patient is STABLE and is discharged to home in good condition.  BP 153/90 mmHg  Pulse 102  Temp(Src) 98.6 F (37 C)  Resp 16  Ht 5' 9" (1.753 m)  Wt 200 lb (90.719 kg)  BMI 29.52 kg/m2  SpO2 96%  Meds ordered this encounter  Medications  . sodium chloride 0.9 % bolus 1,000 mL    Sig:   . fentaNYL (SUBLIMAZE) injection 50 mcg    Sig:   . insulin aspart (novoLOG) injection 7 Units    Sig:   . nitrofurantoin, macrocrystal-monohydrate, (MACROBID) 100 MG capsule    Sig: Take 1 capsule (100 mg total) by   mouth 2 (two) times daily. X 7 days    Dispense:  14 capsule    Refill:  0    Order Specific Question:  Supervising Provider    Answer:  MILLER, BRIAN D [[id number]]  . nystatin (MYCOSTATIN/NYSTOP) 100000 UNIT/GM POWD    Sig: Apply to affected area in groin skin three times daily until resolution of yeast infection.    Dispense:  60 g    Refill:  2    Order Specific Question:  Supervising Provider    Answer:  MILLER, BRIAN D [[id number]]      Strupp Camprubi-Soms, PA-C 04/12/14 1749  5:59 PM After pt was discharged, case worker Wanda stated that it appears that he should have gone to SNF and that potentially he would need admission tonight to get him into SNF, as there were  complications at his previous admission that prevented SNF placement. Signed over care to [name] Sofia NP. Please see her note for further documentation regarding social placement issue.    Strupp Camprubi-Soms, PA-C 04/12/14 1800  David H Yao, MD 04/13/14 0714 

## 2014-04-12 NOTE — Progress Notes (Signed)
See dictated note.

## 2014-04-12 NOTE — Discharge Instructions (Signed)
Stay very well hydrated with plenty of water throughout the day. Take antibiotic until completed. Use nystatin powder as directed. 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 Monitor your sugar closely and start using novolog/lantus as instructed at your last discharge. Follow up with primary care physician in 2 days for recheck of ongoing symptoms but return to ER for emergent changing or worsening of symptoms. Please seek immediate care if you develop the following: You develop back pain.  Your symptoms are no better, or worse in 3 days. There is severe back pain or lower abdominal pain.  You develop chills.  You have a fever.  There is nausea or vomiting.  There is continued burning or discomfort with urination.     Candida Infection A Candida infection (also called yeast, fungus, and Monilia infection) is an overgrowth of yeast that can occur anywhere on the body. A yeast infection commonly occurs in warm, moist body areas. Usually, the infection remains localized but can spread to become a systemic infection. A yeast infection may be a sign of a more severe disease such as diabetes, leukemia, or AIDS. A yeast infection can occur in both men and women. In women, Candida vaginitis is a vaginal infection. It is one of the most common causes of vaginitis. Men usually do not have symptoms or know they have an infection until other problems develop. Men may find out they have a yeast infection because their sex partner has a yeast infection. Uncircumcised men are more likely to get a yeast infection than circumcised men. This is because the uncircumcised glans is not exposed to air and does not remain as dry as that of a circumcised glans. Older adults may develop yeast infections around dentures. CAUSES  Women  Antibiotics.  Steroid medication taken for a long time.  Being overweight (obese).  Diabetes.  Poor immune  condition.  Certain serious medical conditions.  Immune suppressive medications for organ transplant patients.  Chemotherapy.  Pregnancy.  Menstruation.  Stress and fatigue.  Intravenous drug use.  Oral contraceptives.  Wearing tight-fitting clothes in the crotch area.  Catching it from a sex partner who has a yeast infection.  Spermicide.  Intravenous, urinary, or other catheters. Men  Catching it from a sex partner who has a yeast infection.  Having oral or anal sex with a person who has the infection.  Spermicide.  Diabetes.  Antibiotics.  Poor immune system.  Medications that suppress the immune system.  Intravenous drug use.  Intravenous, urinary, or other catheters. SYMPTOMS  Women  Thick, white vaginal discharge.  Vaginal itching.  Redness and swelling in and around the vagina.  Irritation of the lips of the vagina and perineum.  Blisters on the vaginal lips and perineum.  Painful sexual intercourse.  Low blood sugar (hypoglycemia).  Painful urination.  Bladder infections.  Intestinal problems such as constipation, indigestion, bad breath, bloating, increase in gas, diarrhea, or loose stools. Men  Men may develop intestinal problems such as constipation, indigestion, bad breath, bloating, increase in gas, diarrhea, or loose stools.  Dry, cracked skin on the penis with itching or discomfort.  Jock itch.  Dry, flaky skin.  Athlete's foot.  Hypoglycemia. DIAGNOSIS  Women  A history and an exam are performed.  The discharge may be examined under a microscope.  A culture may be taken of the discharge. Men  A history and an exam are performed.  Any discharge from the  penis or areas of cracked skin will be looked at under the microscope and cultured.  Stool samples may be cultured. TREATMENT  Women  Vaginal antifungal suppositories and creams.  Medicated creams to decrease irritation and itching on the outside of the  vagina.  Warm compresses to the perineal area to decrease swelling and discomfort.  Oral antifungal medications.  Medicated vaginal suppositories or cream for repeated or recurrent infections.  Wash and dry the irritation areas before applying the cream.  Eating yogurt with Lactobacillus may help with prevention and treatment.  Sometimes painting the vagina with gentian violet solution may help if creams and suppositories do not work. Men  Antifungal creams and oral antifungal medications.  Sometimes treatment must continue for 30 days after the symptoms go away to prevent recurrence. HOME CARE INSTRUCTIONS  Women  Use cotton underwear and avoid tight-fitting clothing.  Avoid colored, scented toilet paper and deodorant tampons or pads.  Do not douche.  Keep your diabetes under control.  Finish all the prescribed medications.  Keep your skin clean and dry.  Consume milk or yogurt with Lactobacillus-active culture regularly. If you get frequent yeast infections and think that is what the infection is, there are over-the-counter medications that you can get. If the infection does not show healing in 3 days, talk to your caregiver.  Tell your sex partner you have a yeast infection. Your partner may need treatment also, especially if your infection does not clear up or recurs. Men  Keep your skin clean and dry.  Keep your diabetes under control.  Finish all prescribed medications.  Tell your sex partner that you have a yeast infection so he or she can be treated if necessary. SEEK MEDICAL CARE IF:   Your symptoms do not clear up or worsen in one week after treatment.  You have an oral temperature above 102 F (38.9 C).  You have trouble swallowing or eating for a prolonged time.  You develop blisters on and around your vagina.  You develop vaginal bleeding and it is not your menstrual period.  You develop abdominal pain.  You develop intestinal problems as  mentioned above.  You get weak or light-headed.  You have painful or increased urination.  You have pain during sexual intercourse. MAKE SURE YOU:   Understand these instructions.  Will watch your condition.  Will get help right away if you are not doing well or get worse. Document Released: 05/05/2004 Document Revised: 08/12/2013 Document Reviewed: 08/17/2009 [facility] Patient Information 2015 [place], [place]. This information is not intended to replace advice given to you by your health care provider. Make sure you discuss any questions you have with your health care provider.  Diabetic Neuropathy Diabetic neuropathy is a nerve disease or nerve damage that is caused by diabetes mellitus. About half of all people with diabetes mellitus have some form of nerve damage. Nerve damage is more common in those who have had diabetes mellitus for many years and who generally have not had good control of their blood sugar (glucose) level. Diabetic neuropathy is a common complication of diabetes mellitus. There are three more common types of diabetic neuropathy and a fourth type that is less common and less understood:   Peripheral neuropathy--This is the most common type of diabetic neuropathy. It causes damage to the nerves of the feet and legs first and then eventually the hands and arms.The damage affects the ability to sense touch.  Autonomic neuropathy--This type causes damage to the autonomic nervous system,  which controls the following functions:  Heartbeat.  Body temperature.  Blood pressure.  Urination.  Digestion.  Sweating.  Sexual function.  Focal neuropathy--Focal neuropathy can be painful and unpredictable and occurs most often in older adults with diabetes mellitus. It involves a specific nerve or one area and often comes on suddenly. It usually does not cause long-term problems.  Radiculoplexus neuropathy-- Sometimes called lumbosacral radiculoplexus neuropathy,  radiculoplexus neuropathy affects the nerves of the thighs, hips, buttocks, or legs. It is more common in people with type 2 diabetes mellitus and in older men. It is characterized by debilitating pain, weakness, and atrophy, usually in the thigh muscles. CAUSES  The cause of peripheral, autonomic, and focal neuropathies is diabetes mellitus that is uncontrolled and high glucose levels. The cause of radiculoplexus neuropathy is unknown. However, it is thought to be caused by inflammation related to uncontrolled glucose levels. SIGNS AND SYMPTOMS  Peripheral Neuropathy Peripheral neuropathy develops slowly over time. When the nerves of the feet and legs no longer work there may be:   Burning, stabbing, or aching pain in the legs or feet.  Inability to feel pressure or pain in your feet. This can lead to:  Thick calluses over pressure areas.  Pressure sores.  Ulcers.  Foot deformities.  Reduced ability to feel temperature changes.  Muscle weakness. Autonomic Neuropathy The symptoms of autonomic neuropathy vary depending on which nerves are affected. Symptoms may include:  Problems with digestion, such as:  Feeling sick to your stomach (nausea).  Vomiting.  Bloating.  Constipation.  Diarrhea.  Abdominal pain.  Difficulty with urination. This occurs if you lose your ability to sense when your bladder is full. Problems include:  Urine leakage (incontinence).  Inability to empty your bladder completely (retention).  Rapid or irregular heartbeat (palpitations).  Blood pressure drops when you stand up (orthostatic hypotension). When you stand up you may feel:  Dizzy.  Weak.  Faint.  In men, inability to attain and maintain an erection.  In women, vaginal dryness and problems with decreased sexual desire and arousal.  Problems with body temperature regulation.  Increased or decreased sweating. Focal Neuropathy  Abnormal eye movements or abnormal alignment of  both eyes.  Weakness in the wrist.  Foot drop. This results in an inability to lift the foot properly and abnormal walking or foot movement.  Paralysis on one side of your face (Bell palsy).  Chest or abdominal pain. Radiculoplexus Neuropathy  Sudden, severe pain in your hip, thigh, or buttocks.  Weakness and wasting of thigh muscles.  Difficulty rising from a seated position.  Abdominal swelling.  Unexplained weight loss (usually more than 10 lb [4.5 kg]). DIAGNOSIS  Peripheral Neuropathy Your senses may be tested. Sensory function testing can be done with:  A light touch using a monofilament.  A vibration with tuning fork.  A sharp sensation with a pin prick. Other tests that can help diagnose neuropathy are:  Nerve conduction velocity. This test checks the transmission of an electrical current through a nerve.  Electromyography. This shows how muscles respond to electrical signals transmitted by nearby nerves.  Quantitative sensory testing. This is used to assess how your nerves respond to vibrations and changes in temperature. Autonomic Neuropathy Diagnosis is often based on reported symptoms. Tell your health care provider if you experience:   Dizziness.   Constipation.   Diarrhea.   Inappropriate urination or inability to urinate.   Inability to get or maintain an erection.  Tests that may be done include:  Electrocardiography or Holter monitor. These are tests that can help show problems with the heart rate or heart rhythm.   An X-ray exam may be done. Focal Neuropathy Diagnosis is made based on your symptoms and what your health care provider finds during your exam. Other tests may be done. They may include:  Nerve conduction velocities. This checks the transmission of electrical current through a nerve.  Electromyography. This shows how muscles respond to electrical signals transmitted by nearby nerves.  Quantitative sensory testing. This  test is used to assess how your nerves respond to vibration and changes in temperature. Radiculoplexus Neuropathy  Often the first thing is to eliminate any other issue or problems that might be the cause, as there is no stick test for diagnosis.  X-ray exam of your spine and lumbar region.  Spinal tap to rule out cancer.  MRI to rule out other lesions. TREATMENT  Once nerve damage occurs, it cannot be reversed. The goal of treatment is to keep the disease or nerve damage from getting worse and affecting more nerve fibers. Controlling your blood glucose level is the key. Most people with radiculoplexus neuropathy see at least a partial improvement over time. You will need to keep your blood glucose and HbA1c levels in the target range determined by your health care provider. Things that help control blood glucose levels include:   Blood glucose monitoring.   Meal planning.   Physical activity.   Diabetes medicine.  Over time, maintaining lower blood glucose levels helps lessen symptoms. Sometimes, prescription pain medicine is needed. HOME CARE INSTRUCTIONS:  Do not smoke.  Keep your blood glucose level in the range that you and your health care provider have determined acceptable for you.  Keep your blood pressure level in the range that you and your health care provider have determined acceptable for you.  Eat a well-balanced diet.  Be active every day.  Check your feet every day. SEEK MEDICAL CARE IF:   You have burning, stabbing, or aching pain in the legs or feet.  You are unable to feel pressure or pain in your feet.  You develop problems with digestion such as:  Nausea.  Vomiting.  Bloating.  Constipation.  Diarrhea.  Abdominal pain.  You have difficulty with urination, such as:  Incontinence.  Retention.  You have palpitations.  You develop orthostatic hypotension. When you stand up you may feel:  Dizzy.  Weak.  Faint.  You cannot  attain and maintain an erection (in men).  You have vaginal dryness and problems with decreased sexual desire and arousal (in women).  You have severe pain in your thighs, legs, or buttocks.  You have unexplained weight loss. Document Released: 06/06/2001 Document Revised: 01/16/2013 Document Reviewed: 09/06/2012 [facility] Patient Information 2015 [place], [place]. This information is not intended to replace advice given to you by your health care provider. Make sure you discuss any questions you have with your health care provider.  How and Where to Give Subcutaneous Insulin Injections, Adult People with type 1 diabetes must take insulin since their bodies do not make it. People with type 2 diabetes may require insulin. There are many different types of insulin as well as other injectable diabetes medicines that are meant to be injected into the fat layer under your skin. The type of insulin or injectable diabetes medicine you take may determine how many injections you give yourself and when to take the injections.  CHOOSING A SITE FOR INJECTION Insulin absorption varies from site  to site. As with any injectable medication it is best for the insulin to be injected within the same body region. However, do not inject the insulin in the same spot each time. Rotating the spots you give your injections will prevent inflammation or tissue breakdown. There are four main regions that can be used for injections. The regions include the:  Abdomen (preferred region, especially for non-insulin injectable diabetes medicine).  Front and upper outer sides of thighs.  Back of upper arm.  Buttocks. USING A SYRINGE AND VIAL Drawing up insulin: single insulin dose  Wash your hands with soap and water.  Gently roll the insulin bottle (vial) between your hands to mix it. Do not shake the vial.  Clean the top rubber part of the vial with an alcohol wipe. Be sure that the plastic pop-top has been removed on  newer vials.  Remove the plastic cover from the needle on the syringe. Do not let the needle touch anything.  Pull the plunger back to draw air into the syringe. The air should be the same amount as the insulin dose.  Push the needle through the rubber on the top of the vial. Do not turn the vial over.  Push the plunger in all the way to put the air into the vial.  Leave the needle in the vial and turn the vial and syringe upside down.  Pull down slowly on the plunger, drawing the amount of insulin you need into the syringe.  Look for air bubbles in the syringe. You may need to push the plunger up and down 2 to 3 times to slowly get rid of any air bubbles in the syringe.  Pull back the plunger to get your correct dose.  Remove the needle from the vial.  Use an alcohol wipe to clean the area of the body to be injected.  Pinch up 1 inch of skin and hold it.  Put the needle straight into the skin (90-degree angle). Put the needle in as far as it will go (to the hub). The needle may need to be injected at a 45-degree angle in small adults with little fat.  When the needle is in, you can let go of your skin.  Push the plunger down all the way to inject the insulin.  Pull the needle straight out of the skin.  Press the alcohol wipe over the spot where you gave your injection. Keep it there for a few seconds. Do not rub the area.  Do not put the plastic cover back on the needle. Drawing up insulin: mixing 2 insulins  Wash your hands with soap and water.  Gently roll the vial of "cloudy" insulin between your hands or rotate the vial from top to bottom to mix.  Clean the top of both vials with an alcohol wipe. Be sure that the plastic pop-top lid has been removed on newer vials.  Pull air into the syringe to equal the dose of "cloudy" insulin.  Stick the needle into the "cloudy" insulin vial and inject the air. Be sure to keep the vial upright.  Remove the needle from the  "cloudy" insulin vial.  Pull air into the syringe to equal the dose of "clear" insulin.  Stick the needle into the "clear" insulin vial and inject the air.  Leave the needle in the "clear" insulin vial and turn the vial upside down.  Pull down on the plunger and slowly draw into the syringe the number of units of "  clear" insulin desired.  Look for air bubbles in the syringe. You may need to push the plunger up and down 2 to 3 times to slowly get rid of any air bubbles in the syringe.  Remove the needle from the "clear" insulin vial.  Stick the needle into the "cloudy" insulin vial. Do not inject any of the "clear" insulin into the "cloudy" vial.  Turn the "cloudy" vial upside down and pull the plunger down to the number of units that equals the total number of units of "clear" and "cloudy" insulins.  Remove the needle from the "cloudy" insulin vial.  Use an alcohol wipe to clean the area of the body to be injected.  Put the needle straight into the skin (90-degree angle). Put the needle in as far as it will go (to the hub). The needle may need to be injected at a 45-degree angle in small adults with little fat.  When the needle is in, you can let go of your skin.  Push the plunger down all the way to inject the insulin.  Pull the needle straight out of the skin.  Press the alcohol wipe over the spot where you gave your injection. Keep it there for a few seconds. Do not rub the area.  Do not put the plastic cover back on the needle. USING INSULIN PENS  Wash your hands with soap and water.  If you are using the "cloudy" insulin, roll the pen between your palms several times or rotate the pen top to bottom several times.  Remove the insulin pen cap.  Clean the rubber stopper of the cartridge with an alcohol wipe.  Remove the protective paper tab from the disposable needle.  Screw the needle onto the pen.  Remove the outer plastic needle cover.  Remove the inner plastic  needle cover.  Prime the insulin pen by turning the button (dial) to 2 units. Hold the pen with the needle pointing up, and push the dial on the opposite end until a drop of insulin appears at the needle tip. If no insulin appears, repeat this step.  Dial the number of units of insulin you will inject.  Use an alcohol wipe to clean the area of the body to be injected.  Pinch up 1 inch of skin and hold it.  Put the needle straight into the skin (90-degree angle).  Push the dial down to push the insulin into the fat tissue.  Count to 10 slowly. Then, remove the needle from the fat tissue.  Carefully replace the larger outer plastic needle cover over the needle and unscrew the capped needle. THROWING AWAY SUPPLIES  Discard used needles in a puncture proof sharps disposal container. Follow disposal regulations for the area where you live.  Vials and empty disposable pens may be thrown away in the regular trash. Document Released: 06/18/2003 Document Revised: 08/12/2013 Document Reviewed: 09/04/2012 [facility] Patient Information 2015 [place], [place]. This information is not intended to replace advice given to you by your health care provider. Make sure you discuss any questions you have with your health care provider.  Neuropathic Pain We often think that pain has a physical cause. If we get rid of the cause, the pain should go away. Nerves themselves can also cause pain. It is called neuropathic pain, which means nerve abnormality. It may be difficult for the patients who have it and for the treating caregivers. Pain is usually described as acute (short-lived) or chronic (long-lasting). Acute pain is related to  the physical sensations caused by an injury. It can last from a few seconds to many weeks, but it usually goes away when normal healing occurs. Chronic pain lasts beyond the typical healing time. With neuropathic pain, the nerve fibers themselves may be damaged or injured. They then send  incorrect signals to other pain centers. The pain you feel is real, but the cause is not easy to find.  CAUSES  Chronic pain can result from diseases, such as diabetes and shingles (an infection related to chickenpox), or from trauma, surgery, or amputation. It can also happen without any known injury or disease. The nerves are sending pain messages, even though there is no identifiable cause for such messages.   Other common causes of neuropathy include diabetes, phantom limb pain, or Regional Pain Syndrome (RPS).  As with all forms of chronic back pain, if neuropathy is not correctly treated, there can be a number of associated problems that lead to a downward cycle for the patient. These include depression, sleeplessness, feelings of fear and anxiety, limited social interaction and inability to do normal daily activities or work.  The most dramatic and mysterious example of neuropathic pain is called "phantom limb syndrome." This occurs when an arm or a leg has been removed because of illness or injury. The brain still gets pain messages from the nerves that originally carried impulses from the missing limb. These nerves now seem to misfire and cause troubling pain.  Neuropathic pain often seems to have no cause. It responds poorly to standard pain treatment. Neuropathic pain can occur after:  Shingles (herpes zoster virus infection).  A lasting burning sensation of the skin, caused usually by injury to a peripheral nerve.  Peripheral neuropathy which is widespread nerve damage, often caused by diabetes or alcoholism.  Phantom limb pain following an amputation.  Facial nerve problems (trigeminal neuralgia).  Multiple sclerosis.  Reflex sympathetic dystrophy.  Pain which comes with cancer and cancer chemotherapy.  Entrapment neuropathy such as when pressure is put on a nerve such as in carpal tunnel syndrome.  Back, leg, and hip problems (sciatica).  Spine or back surgery.  HIV  Infection or AIDS where nerves are infected by viruses. Your caregiver can explain items in the above list which may apply to you. SYMPTOMS  Characteristics of neuropathic pain are:  Severe, sharp, electric shock-like, shooting, lightening-like, knife-like.  Pins and needles sensation.  Deep burning, deep cold, or deep ache.  Persistent numbness, tingling, or weakness.  Pain resulting from light touch or other stimulus that would not usually cause pain.  Increased sensitivity to something that would normally cause pain, such as a pinprick. Pain may persist for months or years following the healing of damaged tissues. When this happens, pain signals no longer sound an alarm about current injuries or injuries about to happen. Instead, the alarm system itself is not working correctly.  Neuropathic pain may get worse instead of better over time. For some people, it can lead to serious disability. It is important to be aware that severe injury in a limb can occur without a proper, protective pain response.Burns, cuts, and other injuries may go unnoticed. Without proper treatment, these injuries can become infected or lead to further disability. Take any injury seriously, and consult your caregiver for treatment. DIAGNOSIS  When you have a pain with no known cause, your caregiver will probably ask some specific questions:   Do you have any other conditions, such as diabetes, shingles, multiple sclerosis, or HIV infection?  How would you describe your pain? (Neuropathic pain is often described as shooting, stabbing, burning, or searing.)  Is your pain worse at any time of the day? (Neuropathic pain is usually worse at night.)  Does the pain seem to follow a certain physical pathway?  Does the pain come from an area that has missing or injured nerves? (An example would be phantom limb pain.)  Is the pain triggered by minor things such as rubbing against the sheets at night? These questions  often help define the type of pain involved. Once your caregiver knows what is happening, treatment can begin. Anticonvulsant, antidepressant drugs, and various pain relievers seem to work in some cases. If another condition, such as diabetes is involved, better management of that disorder may relieve the neuropathic pain.  TREATMENT  Neuropathic pain is frequently long-lasting and tends not to respond to treatment with narcotic type pain medication. It may respond well to other drugs such as antiseizure and antidepressant medications. Usually, neuropathic problems do not completely go away, but partial improvement is often possible with proper treatment. Your caregivers have large numbers of medications available to treat you. Do not be discouraged if you do not get immediate relief. Sometimes different medications or a combination of medications will be tried before you receive the results you are hoping for. See your caregiver if you have pain that seems to be coming from nowhere and does not go away. Help is available.  SEEK IMMEDIATE MEDICAL CARE IF:   There is a sudden change in the quality of your pain, especially if the change is on only one side of the body.  You notice changes of the skin, such as redness, black or purple discoloration, swelling, or an ulcer.  You cannot move the affected limbs. Document Released: 12/24/2003 Document Revised: 06/20/2011 Document Reviewed: 12/24/2003 [facility] Patient Information 2015 [place], [place]. This information is not intended to replace advice given to you by your health care provider. Make sure you discuss any questions you have with your health care provider.

## 2014-04-12 NOTE — ED Notes (Addendum)
Pt arrived by [facility] from home due to pain to bilateral hands and feet. Pt has chronic pain but states this is new. cbg 337 pta. cbg 264 at triage.

## 2014-04-13 NOTE — Progress Notes (Addendum)
ED CM consulted by Pod E PA concerning patient and wife requesting SNF placement. PMH of anemia, arthritis, DM, chronic pain here with worsening pain and generalized weakness. Reviewed record and noted  Patient was was recently admitted and was denied for SNF placement by the insurance company.  Patient was sent home with [facility] services. Wife states, that they have not received any services as of yet, an initial assessment was done by RN yesterday but she is not sure when the next visit will be.  ED CM contacted [facility] on-call she states, that PT will be out on Monday.  Patient and wife made aware that PT will be out on Monday patient and wife agreeable, provide them with contact information for [place],  if any further question should arise. Patient and wife verbalized understanding and appreciation for the assistance. Updated Alyse Low NP and [place] RN on discharge plan. No further ED CM needs identified

## 2014-04-14 LAB — CBG MONITORING, ED: GLUCOSE-CAPILLARY: 266 mg/dL — AB (ref 70–99)

## 2014-04-15 ENCOUNTER — Other Ambulatory Visit: Payer: Self-pay | Admitting: *Deleted

## 2014-04-15 ENCOUNTER — Telehealth: Payer: Self-pay | Admitting: *Deleted

## 2014-04-15 LAB — URINE CULTURE: Special Requests: NORMAL

## 2014-04-15 MED ORDER — GLIMEPIRIDE 4 MG PO TABS: 4.0000 mg | ORAL_TABLET | Freq: Every day | ORAL | Status: DC

## 2014-04-15 NOTE — Telephone Encounter (Signed)
Patient's spouse called and states patient's glucose is under 250 and that he is not on Lantus and RX was not sent from hospital at discharge. Per Dr [name], restart Glimeperide, do not start Lantus and do not refill Januvia at this time.  Spouse aware and will check glucose and will call back PRN.  Per Dr [name], due to difficulty transporting patient , it is not necessary to come for OV 01/15/2015.

## 2014-04-17 ENCOUNTER — Ambulatory Visit: Payer: Self-pay | Admitting: Physician Assistant
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
## 2014-04-30 ENCOUNTER — Ambulatory Visit: Payer: Self-pay | Admitting: Internal Medicine

## 2014-05-16 DIAGNOSIS — G9050 Complex regional pain syndrome I, unspecified: Secondary | ICD-10-CM | POA: Diagnosis not present

## 2014-05-16 DIAGNOSIS — M79641 Pain in right hand: Secondary | ICD-10-CM | POA: Diagnosis not present

## 2014-05-16 DIAGNOSIS — D72829 Elevated white blood cell count, unspecified: Secondary | ICD-10-CM | POA: Diagnosis not present

## 2014-05-16 DIAGNOSIS — R748 Abnormal levels of other serum enzymes: Secondary | ICD-10-CM | POA: Diagnosis not present

## 2014-05-16 DIAGNOSIS — I10 Essential (primary) hypertension: Secondary | ICD-10-CM | POA: Diagnosis not present

## 2014-05-16 DIAGNOSIS — D649 Anemia, unspecified: Secondary | ICD-10-CM | POA: Diagnosis not present

## 2014-05-16 DIAGNOSIS — E119 Type 2 diabetes mellitus without complications: Secondary | ICD-10-CM | POA: Diagnosis not present

## 2014-05-20 ENCOUNTER — Encounter (INDEPENDENT_AMBULATORY_CARE_PROVIDER_SITE_OTHER): Payer: Self-pay | Admitting: Neurological Surgery

## 2014-05-22 ENCOUNTER — Ambulatory Visit (INDEPENDENT_AMBULATORY_CARE_PROVIDER_SITE_OTHER): Payer: No Typology Code available for payment source | Admitting: Neurological Surgery

## 2014-05-22 VITALS — HR 92 | Temp 97.7°F | Resp 16 | Ht 72.0 in | Wt 235.0 lb

## 2014-05-22 DIAGNOSIS — S129XXD Fracture of neck, unspecified, subsequent encounter: Secondary | ICD-10-CM

## 2014-05-24 ENCOUNTER — Encounter (INDEPENDENT_AMBULATORY_CARE_PROVIDER_SITE_OTHER): Payer: Self-pay | Admitting: Neurological Surgery

## 2014-05-24 DIAGNOSIS — S12231A Unspecified traumatic nondisplaced spondylolisthesis of third cervical vertebra, initial encounter for closed fracture: Secondary | ICD-10-CM | POA: Insufficient documentation

## 2014-05-24 NOTE — Progress Notes (Signed)
See dictated note.
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
## 2014-05-27 ENCOUNTER — Ambulatory Visit (INDEPENDENT_AMBULATORY_CARE_PROVIDER_SITE_OTHER): Payer: Medicare PPO | Admitting: Internal Medicine

## 2014-05-27 ENCOUNTER — Encounter: Payer: Self-pay | Admitting: Internal Medicine

## 2014-05-27 VITALS — BP 118/64 | HR 84 | Temp 98.1°F | Resp 16 | Ht 67.75 in | Wt 207.6 lb

## 2014-05-27 DIAGNOSIS — E119 Type 2 diabetes mellitus without complications: Secondary | ICD-10-CM

## 2014-05-27 DIAGNOSIS — I10 Essential (primary) hypertension: Secondary | ICD-10-CM

## 2014-05-27 DIAGNOSIS — Z79899 Other long term (current) drug therapy: Secondary | ICD-10-CM

## 2014-05-27 DIAGNOSIS — E559 Vitamin D deficiency, unspecified: Secondary | ICD-10-CM

## 2014-05-27 DIAGNOSIS — E782 Mixed hyperlipidemia: Secondary | ICD-10-CM

## 2014-05-27 LAB — CBC WITH DIFFERENTIAL/PLATELET
Basophils Absolute: 0 10*3/uL (ref 0.0–0.1)
Basophils Relative: 0 % (ref 0–1)
Eosinophils Absolute: 0.1 10*3/uL (ref 0.0–0.7)
Eosinophils Relative: 1 % (ref 0–5)
HEMATOCRIT: 33 % — AB (ref 39.0–52.0)
Hemoglobin: 10.2 g/dL — ABNORMAL LOW (ref 13.0–17.0)
LYMPHS ABS: 2.7 10*3/uL (ref 0.7–4.0)
Lymphocytes Relative: 22 % (ref 12–46)
MCH: 25.1 pg — ABNORMAL LOW (ref 26.0–34.0)
MCHC: 30.9 g/dL (ref 30.0–36.0)
MCV: 81.1 fL (ref 78.0–100.0)
MONOS PCT: 5 % (ref 3–12)
MPV: 9.4 fL (ref 8.6–12.4)
Monocytes Absolute: 0.6 10*3/uL (ref 0.1–1.0)
NEUTROS ABS: 8.7 10*3/uL — AB (ref 1.7–7.7)
NEUTROS PCT: 72 % (ref 43–77)
Platelets: 263 10*3/uL (ref 150–400)
RBC: 4.07 MIL/uL — ABNORMAL LOW (ref 4.22–5.81)
RDW: 17 % — ABNORMAL HIGH (ref 11.5–15.5)
WBC: 12.1 10*3/uL — AB (ref 4.0–10.5)
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
## 2014-05-27 NOTE — Progress Notes (Deleted)
   Subjective:    Patient ID: James Parsons, male    DOB: June 16, 1945, 69 y.o.   MRN: [id number]  HPI    Review of Systems     Objective:   Physical Exam        Assessment & Plan:

## 2014-05-27 NOTE — Patient Instructions (Signed)

## 2014-05-28 LAB — HEMOGLOBIN A1C
Hgb A1c MFr Bld: 8.1 % — ABNORMAL HIGH (ref <5.7)
Mean Plasma Glucose: 186 mg/dL — ABNORMAL HIGH (ref <117)

## 2014-05-28 LAB — BASIC METABOLIC PANEL WITH GFR
BUN: 14 mg/dL (ref 6–23)
CHLORIDE: 100 meq/L (ref 96–112)
CO2: 30 mEq/L (ref 19–32)
Calcium: 9.3 mg/dL (ref 8.4–10.5)
Creat: 0.59 mg/dL (ref 0.50–1.35)
Glucose, Bld: 183 mg/dL — ABNORMAL HIGH (ref 70–99)
POTASSIUM: 4.7 meq/L (ref 3.5–5.3)
Sodium: 138 mEq/L (ref 135–145)

## 2014-05-28 LAB — LIPID PANEL
Cholesterol: 118 mg/dL (ref 0–200)
HDL: 45 mg/dL (ref >39)
LDL Cholesterol: 50 mg/dL (ref 0–99)
TRIGLYCERIDES: 113 mg/dL (ref <150)
Total CHOL/HDL Ratio: 2.6 Ratio
VLDL: 23 mg/dL (ref 0–40)

## 2014-05-28 LAB — HEPATIC FUNCTION PANEL
ALBUMIN: 3.5 g/dL (ref 3.5–5.2)
ALK PHOS: 83 U/L (ref 39–117)
ALT: 18 U/L (ref 0–53)
AST: 18 U/L (ref 0–37)
BILIRUBIN INDIRECT: 0.2 mg/dL (ref 0.2–1.2)
BILIRUBIN TOTAL: 0.3 mg/dL (ref 0.2–1.2)
Bilirubin, Direct: 0.1 mg/dL (ref 0.0–0.3)
Total Protein: 6.7 g/dL (ref 6.0–8.3)

## 2014-05-28 LAB — MAGNESIUM: Magnesium: 1.7 mg/dL (ref 1.5–2.5)

## 2014-05-28 LAB — INSULIN, FASTING: Insulin fasting, serum: 58.2 u[IU]/mL — ABNORMAL HIGH (ref 2.0–19.6)

## 2014-05-28 LAB — TSH: TSH: 1.676 u[IU]/mL (ref 0.350–4.500)

## 2014-05-28 LAB — VITAMIN D 25 HYDROXY (VIT D DEFICIENCY, FRACTURES): VIT D 25 HYDROXY: 55 ng/mL (ref 30–100)

## 2014-06-02 ENCOUNTER — Other Ambulatory Visit: Payer: Self-pay | Admitting: Orthopedic Surgery

## 2014-06-03 ENCOUNTER — Encounter (HOSPITAL_BASED_OUTPATIENT_CLINIC_OR_DEPARTMENT_OTHER): Payer: Self-pay | Admitting: *Deleted

## 2014-06-03 NOTE — Progress Notes (Signed)
Pt just had labs and ekg-

## 2014-06-04 NOTE — H&P (Signed)
James Parsons is an 69 y.o. male.    Chief Complaint: Right knee pain  HPI:  Mr. James Parsons is here today with a complaint of pain and popping in his right knee.  He denies any injury.  He states that she's had pain in the knee for over a year.  The pain is bad enough and wake him from sleep.  He walks with a cane.  He denies any current fevers chills night sweats or other signs of infection.  He is status post right total knee arthroplasty performed on 03/01/12 by Dr. Mayer Camel.  He was last seen on 04/23/13.  Past Medical History  Diagnosis Date  . Anemia   . Arthritis   . Blood transfusion   . Clotting disorder     bleeds easily-no dx  . Diabetes mellitus   . Borderline hypertension   . Venous insufficiency   . Hyperlipidemia   . Obesity   . Diverticulosis of colon   . Hx of colonic polyps   . Low back pain   . Lipoma   . RSD (reflex sympathetic dystrophy)   . Mental disorder   . Depression     hx of   . COPD (chronic obstructive pulmonary disease)     denies  . Pneumonia     hx  . Full dentures     Past Surgical History  Procedure Laterality Date  . Back surgery  [id number]  . Morphine pump  2009    due to reaction to morphine/changed to fentanyl  . Excision of lipoma from right olecranon area  11/2000    Dr. Rise [name]  . Microdiscectomy and decompression  05/2002    Dr. Tonita Cong  . C3-4 anterior cervical discectomy and fusion with plating at c3-4  05/2006    Dr. Arnoldo Morale  . Spinal cord stimulator implanted      for pain per Dr. Maryruth Eve  . Subcut pain pump implanted    . Pain pump implantation      with fentanyl  . Tonsillectomy    . Colonoscopy w/ polypectomy    . Knee arthroscopy      right knee  . Total knee arthroplasty  02/29/2012    Procedure: TOTAL KNEE ARTHROPLASTY;  Surgeon: Kerin Salen, MD;  Location: [place];  Service: Orthopedics;  Laterality: Right;  . Lumbar laminectomy/decompression microdiscectomy N/A 01/08/2014    Procedure: L4-S1 Decompression with removal  and reimplantation of spinal cord stimulator battery ;  Surgeon: Melina Schools, MD;  Location: [facility];  Service: Orthopedics;  Laterality: N/A;    Family History  Problem Relation Age of Onset  . Cancer Father   . Colon cancer Neg Hx   . Esophageal cancer Neg Hx   . Stomach cancer Neg Hx   . Rectal cancer Neg Hx    Social History:  reports that he quit smoking about 12 years ago. His smoking use included Cigarettes. He has a 120 pack-year smoking history. He has never used smokeless tobacco. He reports that he does not drink alcohol or use illicit drugs.  Allergies:  Allergies  Allergen Reactions  . Codeine Itching  . Morphine And Related Other (See Comments)    hallucinations    No prescriptions prior to admission    No results found for this or any previous visit (from the past 48 hour(s)). No results found.  Review of Systems  Constitutional: Negative.   HENT: Negative.   Eyes: Negative.   Cardiovascular: Negative.   Gastrointestinal: Negative.  Genitourinary: Negative.   Musculoskeletal: Positive for joint pain.  Skin: Negative.   Neurological: Negative.   Endo/Heme/Allergies: Negative.   Psychiatric/Behavioral: Negative.     Height 5\' 8"  (1.727 m), weight 90.719 kg (200 lb). Physical Exam  Constitutional: He is oriented to person, place, and time. He appears well-developed and well-nourished.  HENT:  Head: Normocephalic and atraumatic.  Eyes: Pupils are equal, round, and reactive to light.  Neck: Normal range of motion. Neck supple.  Cardiovascular: Intact distal pulses.   Respiratory: Effort normal.  Musculoskeletal: He exhibits tenderness.   the patient has good strength good range of motion in the right knee.  He does have obvious crepitance with range of motion.  He does have pain with flexion extension a points to the superior lateral portion of his patella as to the area of his pain.  Mild tenderness to the lateral joint line and over the lateral pole the  patella.  No noticeable swelling.  No instability.  His calves are soft and nontender.  He is neurovascularly intact distally.  Patient does have mild right hip pain over the iliopsoas region.  Neurological: He is alert and oriented to person, place, and time.  Skin: Skin is warm and dry.  Psychiatric: He has a normal mood and affect. His behavior is normal. Judgment and thought content normal.     Assessment/Plan Assess: Right knee pain with concerns for fibrous band following a right total joint performed on 03/01/12  Plan: This patient is discussed with Dr. Mayer Camel who also discusses treatment options with the patient.  Patient is told that we can try a single cortisone injection.  If this injection doesn't provide significant relief and his symptoms return and we may consider knee arthroscopy in the future.  This provided minimal relief patient may need to try some additional physical therapy.  He is asked followup in approximately 3-4 weeks or sooner if needed.  After the injection the patient does mention that he has approximately 80% relief with just the anesthetic medication.Risks and benefits of arthroscopic evaluation treatment of the right knee were discussed with the patient and we'll try to get this set up for him at his convenience.  Joya Willmott R 06/04/2014, 12:53 PM

## 2014-06-06 ENCOUNTER — Ambulatory Visit (HOSPITAL_BASED_OUTPATIENT_CLINIC_OR_DEPARTMENT_OTHER): Payer: Medicare PPO | Admitting: Anesthesiology

## 2014-06-06 ENCOUNTER — Encounter (HOSPITAL_BASED_OUTPATIENT_CLINIC_OR_DEPARTMENT_OTHER): Payer: Self-pay | Admitting: *Deleted

## 2014-06-06 ENCOUNTER — Encounter (HOSPITAL_BASED_OUTPATIENT_CLINIC_OR_DEPARTMENT_OTHER)
Admission: RE | Disposition: A | Discharge status: Home / Self Care | Payer: Self-pay | Source: Ambulatory Visit | Attending: Orthopedic Surgery

## 2014-06-06 ENCOUNTER — Ambulatory Visit (HOSPITAL_BASED_OUTPATIENT_CLINIC_OR_DEPARTMENT_OTHER)
Admission: RE | Admit: 2014-06-06 | Discharge: 2014-06-06 | Disposition: A | Discharge status: Home / Self Care | Payer: Medicare PPO | Source: Ambulatory Visit | Attending: Orthopedic Surgery | Admitting: Orthopedic Surgery

## 2014-06-06 DIAGNOSIS — Z87891 Personal history of nicotine dependence: Secondary | ICD-10-CM | POA: Diagnosis not present

## 2014-06-06 DIAGNOSIS — J449 Chronic obstructive pulmonary disease, unspecified: Secondary | ICD-10-CM | POA: Insufficient documentation

## 2014-06-06 DIAGNOSIS — F329 Major depressive disorder, single episode, unspecified: Secondary | ICD-10-CM | POA: Diagnosis not present

## 2014-06-06 DIAGNOSIS — Z886 Allergy status to analgesic agent status: Secondary | ICD-10-CM | POA: Insufficient documentation

## 2014-06-06 DIAGNOSIS — E669 Obesity, unspecified: Secondary | ICD-10-CM | POA: Insufficient documentation

## 2014-06-06 DIAGNOSIS — Z96651 Presence of right artificial knee joint: Secondary | ICD-10-CM | POA: Insufficient documentation

## 2014-06-06 DIAGNOSIS — D689 Coagulation defect, unspecified: Secondary | ICD-10-CM | POA: Diagnosis not present

## 2014-06-06 DIAGNOSIS — Z86010 Personal history of colonic polyps: Secondary | ICD-10-CM | POA: Diagnosis not present

## 2014-06-06 DIAGNOSIS — E119 Type 2 diabetes mellitus without complications: Secondary | ICD-10-CM | POA: Diagnosis not present

## 2014-06-06 DIAGNOSIS — K5730 Diverticulosis of large intestine without perforation or abscess without bleeding: Secondary | ICD-10-CM | POA: Diagnosis not present

## 2014-06-06 DIAGNOSIS — M25861 Other specified joint disorders, right knee: Secondary | ICD-10-CM | POA: Insufficient documentation

## 2014-06-06 DIAGNOSIS — D649 Anemia, unspecified: Secondary | ICD-10-CM | POA: Diagnosis not present

## 2014-06-06 DIAGNOSIS — T8484XA Pain due to internal orthopedic prosthetic devices, implants and grafts, initial encounter: Secondary | ICD-10-CM

## 2014-06-06 DIAGNOSIS — Z96659 Presence of unspecified artificial knee joint: Secondary | ICD-10-CM

## 2014-06-06 DIAGNOSIS — J189 Pneumonia, unspecified organism: Secondary | ICD-10-CM | POA: Diagnosis not present

## 2014-06-06 DIAGNOSIS — I10 Essential (primary) hypertension: Secondary | ICD-10-CM | POA: Diagnosis not present

## 2014-06-06 DIAGNOSIS — G9050 Complex regional pain syndrome I, unspecified: Secondary | ICD-10-CM | POA: Insufficient documentation

## 2014-06-06 DIAGNOSIS — Z981 Arthrodesis status: Secondary | ICD-10-CM | POA: Insufficient documentation

## 2014-06-06 DIAGNOSIS — E785 Hyperlipidemia, unspecified: Secondary | ICD-10-CM | POA: Insufficient documentation

## 2014-06-06 HISTORY — PX: KNEE ARTHROSCOPY: SHX127

## 2014-06-06 HISTORY — DX: Complete loss of teeth, unspecified cause, unspecified class: K08.109

## 2014-06-06 HISTORY — DX: Presence of dental prosthetic device (complete) (partial): Z97.2

## 2014-06-06 LAB — GLUCOSE, CAPILLARY
Glucose-Capillary: 155 mg/dL — ABNORMAL HIGH (ref 70–99)
Glucose-Capillary: 141 mg/dL — ABNORMAL HIGH (ref 70–99)

## 2014-06-06 SURGERY — ARTHROSCOPY, KNEE
Anesthesia: General | Site: Knee | Laterality: Right

## 2014-06-06 MED ORDER — SODIUM CHLORIDE 0.9 % IR SOLN
Status: DC | PRN
  Administered 2014-06-06: 6000 mL

## 2014-06-06 MED ORDER — EPINEPHRINE HCL 1 MG/ML IJ SOLN
INTRAMUSCULAR | Status: DC | PRN
  Administered 2014-06-06: 2 mL

## 2014-06-06 MED ORDER — OXYCODONE-ACETAMINOPHEN 5-325 MG PO TABS: 1.0000 | ORAL_TABLET | ORAL | Status: DC | PRN

## 2014-06-06 MED ORDER — FENTANYL CITRATE 0.05 MG/ML IJ SOLN
50.0000 ug | INTRAMUSCULAR | Status: DC | PRN
Start: 2014-06-06 — End: 2014-06-06

## 2014-06-06 MED ORDER — CEFAZOLIN SODIUM-DEXTROSE 2-3 GM-% IV SOLR
2.0000 g | Freq: Three times a day (TID) | INTRAVENOUS | Status: DC
  Administered 2014-06-06: 2 g via INTRAVENOUS

## 2014-06-06 MED ORDER — DEXAMETHASONE SODIUM PHOSPHATE 4 MG/ML IJ SOLN
INTRAMUSCULAR | Status: DC | PRN
  Administered 2014-06-06: 4 mg via INTRAVENOUS

## 2014-06-06 MED ORDER — FENTANYL CITRATE 0.05 MG/ML IJ SOLN
INTRAMUSCULAR | Status: DC | PRN
  Administered 2014-06-06 (×2): 25 ug via INTRAVENOUS

## 2014-06-06 MED ORDER — LACTATED RINGERS IV SOLN
INTRAVENOUS | Status: DC
  Administered 2014-06-06 (×2): via INTRAVENOUS

## 2014-06-06 MED ORDER — EPINEPHRINE HCL 1 MG/ML IJ SOLN
INTRAMUSCULAR | Status: AC
  Filled 2014-06-06: qty 1

## 2014-06-06 MED ORDER — LIDOCAINE HCL (CARDIAC) 20 MG/ML IV SOLN
INTRAVENOUS | Status: DC | PRN
  Administered 2014-06-06: 50 mg via INTRAVENOUS

## 2014-06-06 MED ORDER — PHENYLEPHRINE HCL 10 MG/ML IJ SOLN
INTRAMUSCULAR | Status: DC | PRN
  Administered 2014-06-06: 40 ug via INTRAVENOUS

## 2014-06-06 MED ORDER — MIDAZOLAM HCL 2 MG/2ML IJ SOLN
INTRAMUSCULAR | Status: AC
  Filled 2014-06-06: qty 2

## 2014-06-06 MED ORDER — BUPIVACAINE-EPINEPHRINE (PF) 0.5% -1:200000 IJ SOLN
INTRAMUSCULAR | Status: DC | PRN
  Administered 2014-06-06: 20 mL

## 2014-06-06 MED ORDER — MIDAZOLAM HCL 2 MG/2ML IJ SOLN: 1.0000 mg | INTRAMUSCULAR | Status: DC | PRN

## 2014-06-06 MED ORDER — CEFAZOLIN SODIUM-DEXTROSE 2-3 GM-% IV SOLR
INTRAVENOUS | Status: AC
  Filled 2014-06-06: qty 50

## 2014-06-06 MED ORDER — EPHEDRINE SULFATE 50 MG/ML IJ SOLN
INTRAMUSCULAR | Status: DC | PRN
  Administered 2014-06-06: 15 mg via INTRAVENOUS

## 2014-06-06 MED ORDER — FENTANYL CITRATE 0.05 MG/ML IJ SOLN
INTRAMUSCULAR | Status: AC
  Filled 2014-06-06: qty 6

## 2014-06-06 MED ORDER — ONDANSETRON HCL 4 MG/2ML IJ SOLN
INTRAMUSCULAR | Status: DC | PRN
  Administered 2014-06-06: 4 mg via INTRAVENOUS

## 2014-06-06 MED ORDER — PROPOFOL 10 MG/ML IV BOLUS
INTRAVENOUS | Status: DC | PRN
  Administered 2014-06-06: 150 mg via INTRAVENOUS

## 2014-06-06 SURGICAL SUPPLY — 42 items
BANDAGE ELASTIC 6 VELCRO ST LF (GAUZE/BANDAGES/DRESSINGS) ×2 IMPLANT
BLADE 4.2CUDA (BLADE) IMPLANT
BLADE CUTTER GATOR 3.5 (BLADE) IMPLANT
BLADE GREAT WHITE 4.2 (BLADE) ×1 IMPLANT
BNDG COHESIVE 6X5 TAN STRL LF (GAUZE/BANDAGES/DRESSINGS) ×2 IMPLANT
DRAPE ARTHROSCOPY W/POUCH 114 (DRAPES) ×2 IMPLANT
DURAPREP 26ML APPLICATOR (WOUND CARE) ×2 IMPLANT
ELECT MENISCUS 165MM 90D (ELECTRODE) IMPLANT
ELECT REM PT RETURN 9FT ADLT (ELECTROSURGICAL)
ELECTRODE REM PT RTRN 9FT ADLT (ELECTROSURGICAL) IMPLANT
GAUZE SPONGE 4X4 12PLY STRL (GAUZE/BANDAGES/DRESSINGS) ×2 IMPLANT
GAUZE XEROFORM 1X8 LF (GAUZE/BANDAGES/DRESSINGS) ×2 IMPLANT
GLOVE BIO SURGEON STRL SZ7.5 (GLOVE) ×2 IMPLANT
GLOVE BIO SURGEON STRL SZ8.5 (GLOVE) ×2 IMPLANT
GLOVE BIOGEL PI IND STRL 7.0 (GLOVE) IMPLANT
GLOVE BIOGEL PI IND STRL 8 (GLOVE) ×1 IMPLANT
GLOVE BIOGEL PI IND STRL 9 (GLOVE) ×1 IMPLANT
GLOVE BIOGEL PI INDICATOR 7.0 (GLOVE) ×1
GLOVE BIOGEL PI INDICATOR 8 (GLOVE) ×1
GLOVE BIOGEL PI INDICATOR 9 (GLOVE) ×1
GLOVE ECLIPSE 6.5 STRL STRAW (GLOVE) ×1 IMPLANT
GOWN STRL REUS W/ TWL LRG LVL3 (GOWN DISPOSABLE) ×1 IMPLANT
GOWN STRL REUS W/ TWL XL LVL3 (GOWN DISPOSABLE) ×1 IMPLANT
GOWN STRL REUS W/TWL LRG LVL3 (GOWN DISPOSABLE) ×4
GOWN STRL REUS W/TWL XL LVL3 (GOWN DISPOSABLE) ×2
IV NS IRRIG 3000ML ARTHROMATIC (IV SOLUTION) ×2 IMPLANT
KNEE WRAP E Z 3 GEL PACK (MISCELLANEOUS) ×2 IMPLANT
MANIFOLD NEPTUNE II (INSTRUMENTS) ×2 IMPLANT
NDL SAFETY ECLIPSE 18X1.5 (NEEDLE) ×1 IMPLANT
NEEDLE HYPO 18GX1.5 SHARP (NEEDLE) ×2
PACK ARTHROSCOPY DSU (CUSTOM PROCEDURE TRAY) ×2 IMPLANT
PACK BASIN DAY SURGERY FS (CUSTOM PROCEDURE TRAY) ×2 IMPLANT
PAD ALCOHOL SWAB (MISCELLANEOUS) ×4 IMPLANT
PENCIL BUTTON HOLSTER BLD 10FT (ELECTRODE) IMPLANT
SET ARTHROSCOPY TUBING (MISCELLANEOUS) ×2
SET ARTHROSCOPY TUBING LN (MISCELLANEOUS) ×1 IMPLANT
SLEEVE SCD COMPRESS KNEE MED (MISCELLANEOUS) ×1 IMPLANT
SYR 3ML 18GX1 1/2 (SYRINGE) IMPLANT
SYR 5ML LL (SYRINGE) ×3 IMPLANT
TOWEL OR 17X24 6PK STRL BLUE (TOWEL DISPOSABLE) ×2 IMPLANT
WAND STAR VAC 90 (SURGICAL WAND) IMPLANT
WATER STERILE IRR 1000ML POUR (IV SOLUTION) ×2 IMPLANT

## 2014-06-06 NOTE — Anesthesia Procedure Notes (Signed)
Procedure Name: LMA Insertion Date/Time: 06/06/2014 10:19 AM Performed by: Melynda Ripple D Pre-anesthesia Checklist: Patient identified, Emergency Drugs available, Suction available and Patient being monitored Patient Re-evaluated:Patient Re-evaluated prior to inductionOxygen Delivery Method: Circle System Utilized Preoxygenation: Pre-oxygenation with 100% oxygen Intubation Type: IV induction Ventilation: Mask ventilation without difficulty LMA: LMA inserted LMA Size: 5.0 Number of attempts: 1 Airway Equipment and Method: Bite block Placement Confirmation: positive ETCO2 Tube secured with: Tape Dental Injury: Teeth and Oropharynx as per pre-operative assessment

## 2014-06-06 NOTE — Discharge Instructions (Signed)

## 2014-06-06 NOTE — Op Note (Signed)
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 Mayer Camel M.D.  Assist: Kerry Hough. Barton Dubois  (present throughout entire procedure and necessary for timely completion of the procedure) Anes: General LMA  EBL: Minimal  Fluids: 800 cc   Indications: Status post right total knee a few years ago with the onset of catching popping and pain. Got good temporarily from a cortisone injection at the catching popping and discomfort have returned. He's not had any fevers or chills. 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 Risks and benefits of surgery have been discussed and questions answered.  Procedure: Patient identified by arm band and taken to the operating room at the [facility]. The appropriate anesthetic monitors were attached, and General LMA anesthesia was induced without difficulty. Lateral post was applied to the table and the lower extremity was prepped and draped in usual sterile fashion from the ankle to the midthigh. Time out procedure was performed. We began the operation by making standard inferior lateral and inferior medial peripatellar portals with a #11 blade allowing introduction of the arthroscope through the inferior lateral portal and the out flow to the inferior medial portal. Pump pressure was set at 100 mmHg and diagnostic arthroscopy  revealed a very large fibrous band superior and lateral to the the patella was scar tissue that was interposed between the patellar button and the  femur. Using the inferolateral portal and a supplemental lateral parapatellar portal this scar tissue was removed, as well as some small free loose bodies in the suprapatellar pouch there were 5-7 mm in size. Moving down into the medial and lateral compartment the patient also had scar tissue that was interposed into the notch region between the post of the total knee and the notch, there was also scar tissue that had broken off and was free-floating loose bodies to the more 1 cm in size. The scar tissue in the notch and the free loose bodies were removed with a 4.2 mm gray-white sucker shaver. On the medial lateral condyles of the total knee scar tissue was also interposed between the polyethylene bearing and the metal and likewise this scar tissue was excised as well. We also encountered a few more smaller loose bodies in the joint fluid during this part of the procedure. This was somewhat extensive and we had to use a total of 6 L of normal saline solution.. The knee was irrigated out normal saline solution. A dressing of xerofoam 4 x 4 dressing sponges, web roll and an Ace wrap was applied. The patient was awakened extubated and taken to the recovery without difficulty.    Signed: Kerin Salen, MD

## 2014-06-06 NOTE — Anesthesia Postprocedure Evaluation (Signed)
  Anesthesia Post-op Note  Patient: James Parsons  Procedure(s) Performed: Procedure(s): ARTHROSCOPY RIGHT KNEE WITH REMOVAL OF FIBROUS BANDS (Right)  Patient Location: PACU  Anesthesia Type:General  Level of Consciousness: awake and alert   Airway and Oxygen Therapy: Patient Spontanous Breathing  Post-op Pain: mild  Post-op Assessment: Post-op Vital signs reviewed  Post-op Vital Signs: stable  Last Vitals:  Filed Vitals:   06/06/14 1130  BP:   Pulse: 85  Temp:   Resp: 12    Complications: No apparent anesthesia complications

## 2014-06-06 NOTE — Transfer of Care (Signed)
Immediate Anesthesia Transfer of Care Note  Patient: James Parsons  Procedure(s) Performed: Procedure(s): ARTHROSCOPY RIGHT KNEE WITH REMOVAL OF FIBROUS BANDS (Right)  Patient Location: PACU  Anesthesia Type:General  Level of Consciousness: awake, alert  and oriented  Airway & Oxygen Therapy: Patient Spontanous Breathing and Patient connected to face mask oxygen  Post-op Assessment: Report given to RN and Post -op Vital signs reviewed and stable  Post vital signs: Reviewed and stable  Last Vitals:  Filed Vitals:   06/06/14 0905  BP: 128/69  Pulse: 95  Temp: 36.6 C  Resp: 20    Complications: No apparent anesthesia complications

## 2014-06-06 NOTE — Interval H&P Note (Signed)
History and Physical Interval Note:  06/06/2014 8:52 AM  James Parsons  has presented today for surgery, with the diagnosis of fibrous bands of right total knee  The various methods of treatment have been discussed with the patient and family. After consideration of risks, benefits and other options for treatment, the patient has consented to  Procedure(s): ARTHROSCOPY RIGHT KNEE WITH REMOVAL OF FIBROUS BANDS (Right) as a surgical intervention .  The patient's history has been reviewed, patient examined, no change in status, stable for surgery.  I have reviewed the patient's chart and labs.  Questions were answered to the patient's satisfaction.     Kerin Salen

## 2014-06-06 NOTE — Anesthesia Preprocedure Evaluation (Addendum)
Anesthesia Evaluation  Patient identified by MRN, date of birth, ID band Patient awake    Reviewed: Allergy & Precautions, NPO status   History of Anesthesia Complications Negative for: history of anesthetic complications  Airway Mallampati: II   Neck ROM: Full    Dental   Pulmonary COPDformer smoker,  breath sounds clear to auscultation        Cardiovascular hypertension, Rhythm:Regular Rate:Normal     Neuro/Psych  Neuromuscular disease    GI/Hepatic negative GI ROS, Neg liver ROS,   Endo/Other  diabetes  Renal/GU      Musculoskeletal  (+) Arthritis -, Chronic back pain c Fentanyl pump   Abdominal (+) + obese,   Peds  Hematology  (+) anemia ,   Anesthesia Other Findings   Reproductive/Obstetrics                            Anesthesia Physical Anesthesia Plan  ASA: III  Anesthesia Plan: General   Post-op Pain Management:    Induction: Intravenous  Airway Management Planned: LMA  Additional Equipment:   Intra-op Plan:   Post-operative Plan: Extubation in OR  Informed Consent: I have reviewed the patients History and Physical, chart, labs and discussed the procedure including the risks, benefits and alternatives for the proposed anesthesia with the patient or authorized representative who has indicated his/her understanding and acceptance.   Dental advisory given  Plan Discussed with: CRNA and Surgeon  Anesthesia Plan Comments:         Anesthesia Quick Evaluation

## 2014-06-09 ENCOUNTER — Encounter (HOSPITAL_BASED_OUTPATIENT_CLINIC_OR_DEPARTMENT_OTHER): Payer: Self-pay | Admitting: Orthopedic Surgery

## 2014-06-09 LAB — POCT HEMOGLOBIN-HEMACUE: Hemoglobin: 9.8 g/dL — ABNORMAL LOW (ref 13.0–17.0)

## 2014-07-14 ENCOUNTER — Other Ambulatory Visit: Payer: Self-pay | Admitting: Internal Medicine

## 2014-07-23 DIAGNOSIS — M79643 Pain in unspecified hand: Secondary | ICD-10-CM | POA: Diagnosis not present

## 2014-07-23 DIAGNOSIS — R5383 Other fatigue: Secondary | ICD-10-CM | POA: Diagnosis not present

## 2014-07-23 DIAGNOSIS — M7989 Other specified soft tissue disorders: Secondary | ICD-10-CM | POA: Diagnosis not present

## 2014-07-23 DIAGNOSIS — M2550 Pain in unspecified joint: Secondary | ICD-10-CM | POA: Diagnosis not present

## 2014-07-23 DIAGNOSIS — M791 Myalgia: Secondary | ICD-10-CM | POA: Diagnosis not present

## 2014-08-28 ENCOUNTER — Encounter: Payer: Self-pay | Admitting: Physician Assistant

## 2014-08-28 ENCOUNTER — Ambulatory Visit (INDEPENDENT_AMBULATORY_CARE_PROVIDER_SITE_OTHER): Payer: Medicare PPO | Admitting: Internal Medicine

## 2014-08-28 VITALS — BP 138/80 | HR 80 | Temp 97.9°F | Resp 16 | Ht 67.75 in | Wt 217.0 lb

## 2014-08-28 DIAGNOSIS — E782 Mixed hyperlipidemia: Secondary | ICD-10-CM

## 2014-08-28 DIAGNOSIS — E119 Type 2 diabetes mellitus without complications: Secondary | ICD-10-CM

## 2014-08-28 DIAGNOSIS — Z79899 Other long term (current) drug therapy: Secondary | ICD-10-CM

## 2014-08-28 DIAGNOSIS — E559 Vitamin D deficiency, unspecified: Secondary | ICD-10-CM

## 2014-08-28 DIAGNOSIS — I10 Essential (primary) hypertension: Secondary | ICD-10-CM

## 2014-08-28 DIAGNOSIS — R300 Dysuria: Secondary | ICD-10-CM

## 2014-08-28 LAB — CBC WITH DIFFERENTIAL/PLATELET
BASOS PCT: 0 % (ref 0–1)
Basophils Absolute: 0 10*3/uL (ref 0.0–0.1)
Eosinophils Absolute: 0.1 10*3/uL (ref 0.0–0.7)
Eosinophils Relative: 1 % (ref 0–5)
HCT: 38.2 % — ABNORMAL LOW (ref 39.0–52.0)
Hemoglobin: 12.5 g/dL — ABNORMAL LOW (ref 13.0–17.0)
Lymphocytes Relative: 16 % (ref 12–46)
Lymphs Abs: 2.1 10*3/uL (ref 0.7–4.0)
MCH: 25.9 pg — ABNORMAL LOW (ref 26.0–34.0)
MCHC: 32.7 g/dL (ref 30.0–36.0)
MCV: 79.1 fL (ref 78.0–100.0)
MONOS PCT: 4 % (ref 3–12)
MPV: 9.8 fL (ref 8.6–12.4)
Monocytes Absolute: 0.5 10*3/uL (ref 0.1–1.0)
NEUTROS PCT: 79 % — AB (ref 43–77)
Neutro Abs: 10.6 10*3/uL — ABNORMAL HIGH (ref 1.7–7.7)
Platelets: 251 10*3/uL (ref 150–400)
RBC: 4.83 MIL/uL (ref 4.22–5.81)
RDW: 17.4 % — ABNORMAL HIGH (ref 11.5–15.5)
WBC: 13.4 10*3/uL — ABNORMAL HIGH (ref 4.0–10.5)

## 2014-08-28 NOTE — Patient Instructions (Signed)
Ways to cut 100 calories  1. Eat your eggs with hot sauce OR salsa instead of cheese.  Eggs are great for breakfast, but many people consider eggs and cheese to be BFFs. Instead of cheese-1 oz. of cheddar has 114 calories-top your eggs with hot sauce, which contains no calories and helps with satiety and metabolism. Salsa is also a great option!!  2. Top your toast, waffles or pancakes with mashed berries instead of jelly or syrup. Half a cup of berries-fresh, frozen or thawed-has about 40 calories, compared with 2 tbsp. of maple syrup or jelly, which both have about 100 calories. The berries will also give you a good punch of fiber, which helps keep you full and satisfied and won't spike blood sugar quickly like the jelly or syrup. 3. Swap the non-fat latte for black coffee with a splash of half-and-half. Contrary to its name, that non-fat latte has 130 calories and a startling 19g of carbohydrates per 16 oz. serving. Replacing that 'light' drinkable dessert with a black coffee with a splash of half-and-half saves you more than 100 calories per 16 oz. serving. 4. Sprinkle salads with freeze-dried raspberries instead of dried cranberries. If you want a sweet addition to your nutritious salad, stay away from dried cranberries. They have a whopping 130 calories per  cup and 30g carbohydrates. Instead, sprinkle freeze-dried raspberries guilt-free and save more than 100 calories per  cup serving, adding 3g of belly-filling fiber. 5. Go for mustard in place of mayo on your sandwich. Mustard can add really nice flavor to any sandwich, and there are tons of varieties, from spicy to honey. A serving of mayo is 95 calories, versus 10 calories in a serving of mustard. 6. Choose a DIY salad dressing instead of the store-bought kind. Mix Dijon or whole grain mustard with low-fat Kefir or red wine vinegar and garlic. 7. Use hummus as a spread instead of a dip. Use hummus as a spread on a high-fiber cracker or  tortilla with a sandwich and save on calories without sacrificing taste. 8. Pick just one salad "accessory." Salad isn't automatically a calorie winner. It's easy to over-accessorize with toppings. Instead of topping your salad with nuts, avocado and cranberries (all three will clock in at 313 calories), just pick one. The next day, choose a different accessory, which will also keep your salad interesting. You don't wear all your jewelry every day, right? 9. Ditch the white pasta in favor of spaghetti squash. One cup of cooked spaghetti squash has about 40 calories, compared with traditional spaghetti, which comes with more than 200. Spaghetti squash is also nutrient-dense. It's a good source of fiber and Vitamins A and C, and it can be eaten just like you would eat pasta-with a great tomato sauce and [place] meatballs or with pesto, tofu and spinach, for example. 10. Dress up your chili, soups and stews with non-fat [place] yogurt instead of sour cream. Just a 'dollop' of sour cream can set you back 115 calories and a whopping 12g of fat-seven of which are of the artery-clogging variety. Added bonus: [place] yogurt is packed with muscle-building protein, calcium and B Vitamins. 11. Mash cauliflower instead of mashed potatoes. One cup of traditional mashed potatoes-in all their creamy goodness-has more than 200 calories, compared to mashed cauliflower, which you can typically eat for less than 100 calories per 1 cup serving. Cauliflower is a great source of the antioxidant indole-3-carbinol (I3C), which may help reduce the risk of some cancers, like breast  cancer. 12. Ditch the ice cream sundae in favor of a Greek yogurt parfait. Instead of a cup of ice cream or fro-yo for dessert, try 1 cup of nonfat Greek yogurt topped with fresh berries and a sprinkle of cacao nibs. Both toppings are packed with antioxidants, which can help reduce cellular inflammation and oxidative damage. And the comparison is a  no-brainer: One cup of ice cream has about 275 calories; one cup of frozen yogurt has about 230; and a cup of Greek yogurt has just 130, plus twice the protein, so you're less likely to return to the freezer for a second helping. 13. Put olive oil in a spray container instead of using it directly from the bottle. Each tablespoon of olive oil is 120 calories and 15g of fat. Use a mister instead of pouring it straight into the pan or onto a salad. This allows for portion control and will save you more than 100 calories. 14. When baking, substitute canned pumpkin for butter or oil. Canned pumpkin-not pumpkin pie mix-is loaded with Vitamin A, which is important for skin and eye health, as well as immunity. And the comparisons are pretty crazy:  cup of canned pumpkin has about 40 calories, compared to butter or oil, which has more than 800 calories. Yes, 800 calories. Applesauce and mashed banana can also serve as good substitutions for butter or oil, usually in a 1:1 ratio. 15. Top casseroles with high-fiber cereal instead of breadcrumbs. Breadcrumbs are typically made with white bread, while breakfast cereals contain 5-9g of fiber per serving. Not only will you save more than 150 calories per  cup serving, the swap will also keep you more full and you'll get a metabolism boost from the added fiber. 16. Snack on pistachios instead of macadamia nuts. Believe it or not, you get the same amount of calories from 35 pistachios (100 calories) as you would from only five macadamia nuts. 17. Chow down on kale chips rather than potato chips. This is my favorite 'don't knock it 'till you try it' swap. Kale chips are so easy to make at home, and you can spice them up with a little grated parmesan or chili powder. Plus, they're a mere fraction of the calories of potato chips, but with the same crunch factor we crave so often. 18. Add seltzer and some fruit slices to your cocktail instead of soda or fruit juice. One  cup of soda or fruit juice can pack on as much as 140 calories. Instead, use seltzer and fruit slices. The fruit provides valuable phytochemicals, such as flavonoids and anthocyanins, which help to combat cancer and stave off the aging process.  

## 2014-08-28 NOTE — Progress Notes (Signed)
Patient ID: James Parsons, male   DOB: 23-Oct-1945, 69 y.o.   MRN: [id number]  Assessment and Plan:  Hypertension:  -Continue medication -monitor blood pressure at home. -Continue DASH diet -Reminder to go to the ER if any CP, SOB, nausea, dizziness, severe HA, changes vision/speech, left arm numbness and tingling and jaw pain.  Cholesterol - Continue diet and exercise -Check cholesterol.   Diabetes without complications -Continue diet and exercise.  -Check A1C  Vitamin D Def -check level -continue medications.   Dysuria -history of bad UTI -recheck urine doubt UTI at this time -UA/Urine culture  Continue diet and meds as discussed. Further disposition pending results of labs. Discussed med's effects and SE's.    HPI 69 y.o. male  presents for 3 month follow up with hypertension, hyperlipidemia, diabetes and vitamin D deficiency.   His blood pressure has been controlled at home, today their BP is BP: 138/80 mmHg.He does not workout.  He is doing a lot of walking and is trying to increase getting up and out of chairs.   He denies chest pain, shortness of breath, dizziness.   He is on cholesterol medication and denies myalgias. His cholesterol is at goal. The cholesterol was:  05/27/2014: Cholesterol, Total 118; HDL-C 45; LDL (calc) 50; Triglycerides 113   He has been working on diet and exercise for diabetes without complications, he is not on bASA, he is not on ACE/ARB, and denies  foot ulcerations, hyperglycemia, hypoglycemia , increased appetite, nausea, paresthesia of the feet, polydipsia, polyuria, visual disturbances, vomiting and weight loss. Last A1C was: 05/27/2014: Hemoglobin-A1c 8.1*   Patient is on Vitamin D supplement. 05/27/2014: VITD 55  Patient reports that he is seeing a rheumatologist.  Dr. Lenna Gilford is seeing him.  He has recently started methotrexate and is also taking prednisone for rheumatoid arthritis.  He reports that in his hands its really bad.     Current  Medications:  Current Outpatient Prescriptions on File Prior to Visit  Medication Sig Dispense Refill  . celecoxib (CELEBREX) 200 MG capsule Take 200 mg by mouth 2 (two) times daily.  2  . Cholecalciferol (VITAMIN D-3) 5000 UNITS TABS Take 5,000 Units by mouth 2 (two) times daily.    . cloNIDine (CATAPRES) 0.1 MG tablet Take 0.1 mg by mouth 2 (two) times daily.    . cyclobenzaprine (FLEXERIL) 10 MG tablet Take 10 mg by mouth 3 (three) times daily as needed for muscle spasms. Muscle spasm.    . docusate sodium 100 MG CAPS Take 100 mg by mouth 2 (two) times daily. (Patient taking differently: Take 100 mg by mouth 2 (two) times daily. prn) 10 capsule 0  . DULoxetine (CYMBALTA) 60 MG capsule Take 60 mg by mouth daily.     . fentaNYL 0.05 MG/ML SOLN 5,000 mcg Inject into the skin continuous. Patient will bring dosage instructions with him. Daily dose 0.84mg     . glimepiride (AMARYL) 4 MG tablet Take 1 tablet (4 mg total) by mouth daily with breakfast. 90 tablet 0  . lidocaine (LIDODERM) 5 % Place 1 patch onto the skin daily. 30 patch 0  . LYRICA 150 MG capsule Take 150 mg by mouth 2 (two) times daily.     . metFORMIN (GLUCOPHAGE) 500 MG tablet TAKE TWO TABLETS BY MOUTH TWICE DAILY  360 tablet 0  . oxyCODONE-acetaminophen (PERCOCET) 10-325 MG per tablet Take 1 tablet by mouth every 4 (four) hours as needed for pain.    [name] oxyCODONE-acetaminophen (ROXICET) 5-325 MG  per tablet Take 1 tablet by mouth every 4 (four) hours as needed. 60 tablet 0  . polyethylene glycol (MIRALAX / GLYCOLAX) packet Take 17 g by mouth daily. 14 each 0  . simvastatin (ZOCOR) 40 MG tablet Take 40 mg by mouth daily.    . tamsulosin (FLOMAX) 0.4 MG CAPS capsule Take 0.4 mg by mouth daily after supper.     . traMADol (ULTRAM) 50 MG tablet Take 1 tablet (50 mg total) by mouth every 6 (six) hours as needed for moderate pain. pain 30 tablet 0   No current facility-administered medications on file prior to visit.   Medical History:   Past Medical History  Diagnosis Date  . Anemia   . Arthritis   . Blood transfusion   . Clotting disorder     bleeds easily-no dx  . Diabetes mellitus   . Borderline hypertension   . Venous insufficiency   . Hyperlipidemia   . Obesity   . Diverticulosis of colon   . Hx of colonic polyps   . Low back pain   . Lipoma   . RSD (reflex sympathetic dystrophy)   . Mental disorder   . Depression     hx of   . COPD (chronic obstructive pulmonary disease)     denies  . Pneumonia     hx  . Full dentures    Allergies:  Allergies  Allergen Reactions  . Codeine Itching  . Morphine And Related Other (See Comments)    hallucinations     Review of Systems:  Review of Systems  Constitutional: Negative for fever, chills and malaise/fatigue.  HENT: Negative for congestion, ear pain and sore throat.   Eyes: Negative.   Respiratory: Negative for cough, shortness of breath and wheezing.   Cardiovascular: Negative for chest pain, palpitations and leg swelling.  Gastrointestinal: Positive for constipation. Negative for heartburn, nausea, vomiting, abdominal pain, diarrhea, blood in stool and melena.  Genitourinary: Positive for urgency and frequency. Negative for dysuria.       Odor to urine and is foamy  Skin: Negative.   Neurological: Negative for dizziness, sensory change and headaches.  Psychiatric/Behavioral: Negative for depression. The patient is not nervous/anxious.     Family history- Review and unchanged  Social history- Review and unchanged  Physical Exam: BP 138/80 mmHg  Pulse 80  Temp(Src) 97.9 F (36.6 C)  Resp 16  Ht 5' 7.75" (1.721 m)  Wt 217 lb (98.431 kg)  BMI 33.23 kg/m2 Wt Readings from Last 3 Encounters:  08/28/14 217 lb (98.431 kg)  06/06/14 205 lb (92.987 kg)  05/27/14 207 lb 9.6 oz (94.167 kg)   General Appearance: Well nourished well developed, non-toxic appearing, in no apparent distress. Eyes: PERRLA, EOMs, conjunctiva no swelling or  erythema ENT/Mouth: Ear canals clear with no erythema, swelling, or discharge.  TMs normal bilaterally, oropharynx clear, moist, with no exudate.   Neck: Supple, thyroid normal, no JVD, no cervical adenopathy.  Respiratory: Respiratory effort normal, breath sounds clear A&P, no wheeze, rhonchi or rales noted.  No retractions, no accessory muscle usage Cardio: RRR with no MRGs. No noted edema.  Abdomen: Soft, + BS.  Non tender, no guarding, rebound, hernias, masses. Musculoskeletal: Full ROM, 5/5 strength, antalgic with a cane gait, weakness of bilateral hands and swelling of the hand joints.   Skin: Warm, dry without rashes, lesions, ecchymosis.  Neuro: Awake and oriented X 3, Cranial nerves intact. No cerebellar symptoms.  Psych: normal affect, Insight and Judgment appropriate.  FORCUCCI, Clema Skousen, PA-C 11:23 AM [facility]

## 2014-08-29 ENCOUNTER — Other Ambulatory Visit: Payer: Self-pay | Admitting: Internal Medicine

## 2014-08-29 LAB — LIPID PANEL
CHOL/HDL RATIO: 2.5 ratio
Cholesterol: 138 mg/dL (ref 0–200)
HDL: 56 mg/dL (ref >=40)
LDL CALC: 61 mg/dL (ref 0–99)
TRIGLYCERIDES: 107 mg/dL (ref <150)
VLDL: 21 mg/dL (ref 0–40)

## 2014-08-29 LAB — INSULIN, RANDOM: INSULIN: 20 u[IU]/mL — AB (ref 2.0–19.6)

## 2014-08-29 LAB — BASIC METABOLIC PANEL WITH GFR
BUN: 20 mg/dL (ref 6–23)
CO2: 25 mEq/L (ref 19–32)
Calcium: 10.1 mg/dL (ref 8.4–10.5)
Chloride: 100 mEq/L (ref 96–112)
Creat: 0.64 mg/dL (ref 0.50–1.35)
GFR, Est African American: >89 mL/min
GFR, Est Non African American: >89 mL/min
Glucose, Bld: 207 mg/dL — ABNORMAL HIGH (ref 70–99)
Potassium: 5 mEq/L (ref 3.5–5.3)
Sodium: 135 mEq/L (ref 135–145)

## 2014-08-29 LAB — URINALYSIS, MICROSCOPIC ONLY
Bacteria, UA: NONE SEEN
Casts: NONE SEEN
Crystals: NONE SEEN
Squamous Epithelial / LPF: NONE SEEN

## 2014-08-29 LAB — MAGNESIUM: Magnesium: 1.9 mg/dL (ref 1.5–2.5)

## 2014-08-29 LAB — HEPATIC FUNCTION PANEL
ALT: 23 U/L (ref 0–53)
AST: 16 U/L (ref 0–37)
Albumin: 4.2 g/dL (ref 3.5–5.2)
Alkaline Phosphatase: 80 U/L (ref 39–117)
BILIRUBIN DIRECT: 0.1 mg/dL (ref 0.0–0.3)
BILIRUBIN INDIRECT: 0.2 mg/dL (ref 0.2–1.2)
BILIRUBIN TOTAL: 0.3 mg/dL (ref 0.2–1.2)
Total Protein: 7.5 g/dL (ref 6.0–8.3)

## 2014-08-29 LAB — TSH: TSH: 2.402 u[IU]/mL (ref 0.350–4.500)

## 2014-08-29 LAB — HEMOGLOBIN A1C
Hgb A1c MFr Bld: 7.9 % — ABNORMAL HIGH (ref <5.7)
Mean Plasma Glucose: 180 mg/dL — ABNORMAL HIGH (ref <117)

## 2014-08-29 MED ORDER — DAPAGLIFLOZIN PROPANEDIOL 5 MG PO TABS: 5.0000 mg | ORAL_TABLET | Freq: Every day | ORAL | Status: DC

## 2014-08-30 LAB — URINE CULTURE
Colony Count: NO GROWTH
ORGANISM ID, BACTERIA: NO GROWTH

## 2014-08-31 LAB — VITAMIN D 1,25 DIHYDROXY
VITAMIN D3 1, 25 (OH): 83 pg/mL
Vitamin D 1, 25 (OH)2 Total: 83 pg/mL — ABNORMAL HIGH (ref 18–72)
Vitamin D2 1, 25 (OH)2: <8 pg/mL

## 2014-09-01 ENCOUNTER — Other Ambulatory Visit: Payer: Self-pay | Admitting: Internal Medicine

## 2014-09-01 MED ORDER — EMPAGLIFLOZIN 10 MG PO TABS: 10.0000 mg | ORAL_TABLET | Freq: Every day | ORAL | Status: DC

## 2014-09-24 ENCOUNTER — Telehealth: Payer: Self-pay | Admitting: *Deleted

## 2014-09-24 MED ORDER — PREDNISONE 20 MG PO TABS: ORAL_TABLET | ORAL | Status: DC

## 2014-09-24 NOTE — Telephone Encounter (Signed)
Patient called and states he has poison oak rash on arms and near his eye.  OK to send in an RX for Prednisone.

## 2014-10-01 DIAGNOSIS — M7989 Other specified soft tissue disorders: Secondary | ICD-10-CM | POA: Diagnosis not present

## 2014-10-01 DIAGNOSIS — M2550 Pain in unspecified joint: Secondary | ICD-10-CM | POA: Diagnosis not present

## 2014-10-01 DIAGNOSIS — G8929 Other chronic pain: Secondary | ICD-10-CM | POA: Diagnosis not present

## 2014-10-01 DIAGNOSIS — M1990 Unspecified osteoarthritis, unspecified site: Secondary | ICD-10-CM | POA: Diagnosis not present

## 2014-10-01 DIAGNOSIS — M545 Low back pain: Secondary | ICD-10-CM | POA: Diagnosis not present

## 2014-10-06 ENCOUNTER — Other Ambulatory Visit: Payer: Self-pay

## 2014-10-12 ENCOUNTER — Other Ambulatory Visit: Payer: Self-pay | Admitting: Internal Medicine

## 2014-10-29 ENCOUNTER — Telehealth: Payer: Self-pay | Admitting: *Deleted

## 2014-10-29 MED ORDER — AZITHROMYCIN 250 MG PO TABS: ORAL_TABLET | ORAL | Status: AC

## 2014-10-29 NOTE — Telephone Encounter (Signed)
Patient called and states he has a productive cough with green mucus and congestion. Per Dr [name], [place] to send RX for a Z-pak and patient advised he will need an OV if not better by next week.

## 2014-11-04 ENCOUNTER — Ambulatory Visit (INDEPENDENT_AMBULATORY_CARE_PROVIDER_SITE_OTHER): Payer: Medicare PPO | Admitting: Internal Medicine

## 2014-11-04 ENCOUNTER — Encounter: Payer: Self-pay | Admitting: Internal Medicine

## 2014-11-04 ENCOUNTER — Other Ambulatory Visit: Payer: Self-pay | Admitting: Internal Medicine

## 2014-11-04 VITALS — BP 144/78 | HR 108 | Temp 98.2°F | Resp 18 | Ht 67.75 in | Wt 215.0 lb

## 2014-11-04 DIAGNOSIS — J069 Acute upper respiratory infection, unspecified: Secondary | ICD-10-CM

## 2014-11-04 DIAGNOSIS — R0600 Dyspnea, unspecified: Secondary | ICD-10-CM

## 2014-11-04 MED ORDER — ALBUTEROL SULFATE HFA 108 (90 BASE) MCG/ACT IN AERS: 2.0000 | INHALATION_SPRAY | Freq: Four times a day (QID) | RESPIRATORY_TRACT | Status: DC | PRN

## 2014-11-04 MED ORDER — LEVOFLOXACIN 750 MG PO TABS: 750.0000 mg | ORAL_TABLET | Freq: Every day | ORAL | Status: DC

## 2014-11-04 MED ORDER — GUAIFENESIN-DM 100-10 MG/5ML PO SYRP: 5.0000 mL | ORAL_SOLUTION | ORAL | Status: DC | PRN

## 2014-11-04 NOTE — Patient Instructions (Signed)
Please start taking the breo daily.  Use 1 inhalation per day and then rinse your mouth out when you are done.  Please start taking levaquin daily until it is gone.  Take with food to decrease upset stomach.    Please take cough syrup as needed to help with coughing.   You can use albuterol inhaler every 6 hours as needed for shortness of breath and wheezing.  The prednisone you will want to take 60 mg on the first day, and then 40 mg daily of prednisone for 4 days.  Take claritin or zyrtec to help dry up any congestion draining down the throat.    [facility]  If worsening shortness of breath, intractable fevers or any other concerning symptoms please go to ER.

## 2014-11-04 NOTE — Progress Notes (Signed)
Patient ID: James Parsons, male   DOB: [birth year], 69 y.o.   MRN: [id number]  HPI  Patient presents to the office for evaluation of cough.  It has been going on for 3 weeks.  Patient reports day > night, wet, no prior trial of bronchodilator therapy.  They also endorse change in voice, chills, fever, postnasal drip, shortness of breath, sputum production, wheezing and no other symptoms.  They have tried antibiotics or alkaseltzer cold.  They report that nothing has worked.  They denies other sick contacts.  He reports that he has not recently been admitted to the hospital.    Review of Systems  Constitutional: Positive for malaise/fatigue. Negative for fever and chills.  HENT: Negative for congestion, ear discharge, ear pain and sore throat.   Respiratory: Positive for cough, sputum production, shortness of breath and wheezing. Negative for hemoptysis.   Cardiovascular: Negative for chest pain, palpitations and leg swelling.  Neurological: Positive for weakness. Negative for headaches.    PE: Filed Vitals:   11/04/14 1626  BP: 144/78  Pulse: 108  Temp: 98.2 F (36.8 C)  Resp: 18    General:  Alert and non-toxic, WDWN, NAD HEENT: NCAT, PERLA, EOM normal, no occular discharge or erythema.  Nasal mucosal edema with sinus tenderness to palpation.  Oropharynx clear with minimal oropharyngeal edema and erythema.  Mucous membranes moist and pink. Neck:  Cervical adenopathy Chest:  RRR no MRGs.  Lungs clear to auscultation A&P with no wheezes rhonchi or rales.   Abdomen: +BS x 4 quadrants, soft, non-tender, no guarding, rigidity, or rebound. Skin: warm and dry no rash Neuro: A&Ox4, CN II-XII grossly intact  Assessment and Plan:   1. Dyspnea  - DG Chest 2 View; Future - levofloxacin (LEVAQUIN) 750 MG tablet; Take 1 tablet (750 mg total) by mouth daily. X 7 days  Dispense: 7 tablet; Refill: 0 - guaiFENesin-dextromethorphan (ROBITUSSIN DM) 100-10 MG/5ML syrup; Take 5 mLs by mouth every 4  (four) hours as needed for cough.  Dispense: 118 mL; Refill: 0 - albuterol (PROVENTIL HFA;VENTOLIN HFA) 108 (90 BASE) MCG/ACT inhaler; Inhale 2 puffs into the lungs every 6 (six) hours as needed for wheezing or shortness of breath (cough).  Dispense: 1 Inhaler; Refill: 1  2. Acute URI -zyrtec -nasal saline - DG Chest 2 View; Future - levofloxacin (LEVAQUIN) 750 MG tablet; Take 1 tablet (750 mg total) by mouth daily. X 7 days  Dispense: 7 tablet; Refill: 0 - guaiFENesin-dextromethorphan (ROBITUSSIN DM) 100-10 MG/5ML syrup; Take 5 mLs by mouth every 4 (four) hours as needed for cough.  Dispense: 118 mL; Refill: 0 - albuterol (PROVENTIL HFA;VENTOLIN HFA) 108 (90 BASE) MCG/ACT inhaler; Inhale 2 puffs into the lungs every 6 (six) hours as needed for wheezing or shortness of breath (cough).  Dispense: 1 Inhaler; Refill: 1 -patient to go to ER if worsening SOB, dizziness, intractable fevers.  He states understanding.

## 2014-11-05 ENCOUNTER — Ambulatory Visit (HOSPITAL_COMMUNITY)
Admission: RE | Admit: 2014-11-05 | Discharge: 2014-11-05 | Disposition: A | Discharge status: Home / Self Care | Payer: Medicare PPO | Source: Ambulatory Visit | Attending: Internal Medicine | Admitting: Internal Medicine

## 2014-11-05 DIAGNOSIS — R05 Cough: Secondary | ICD-10-CM | POA: Insufficient documentation

## 2014-11-05 DIAGNOSIS — R0602 Shortness of breath: Secondary | ICD-10-CM | POA: Insufficient documentation

## 2014-11-05 DIAGNOSIS — J449 Chronic obstructive pulmonary disease, unspecified: Secondary | ICD-10-CM | POA: Diagnosis not present

## 2014-11-05 DIAGNOSIS — J069 Acute upper respiratory infection, unspecified: Secondary | ICD-10-CM

## 2014-11-05 DIAGNOSIS — R0989 Other specified symptoms and signs involving the circulatory and respiratory systems: Secondary | ICD-10-CM | POA: Diagnosis not present

## 2014-11-05 DIAGNOSIS — R0600 Dyspnea, unspecified: Secondary | ICD-10-CM

## 2014-11-10 ENCOUNTER — Other Ambulatory Visit: Payer: Self-pay | Admitting: Internal Medicine

## 2014-12-02 ENCOUNTER — Ambulatory Visit (INDEPENDENT_AMBULATORY_CARE_PROVIDER_SITE_OTHER): Payer: Medicare PPO | Admitting: Physician Assistant

## 2014-12-02 ENCOUNTER — Encounter: Payer: Self-pay | Admitting: Internal Medicine

## 2014-12-02 VITALS — BP 122/80 | HR 92 | Temp 97.1°F | Resp 16 | Ht 67.75 in | Wt 208.0 lb

## 2014-12-02 DIAGNOSIS — Z79899 Other long term (current) drug therapy: Secondary | ICD-10-CM

## 2014-12-02 DIAGNOSIS — I10 Essential (primary) hypertension: Secondary | ICD-10-CM

## 2014-12-02 DIAGNOSIS — E782 Mixed hyperlipidemia: Secondary | ICD-10-CM

## 2014-12-02 DIAGNOSIS — E559 Vitamin D deficiency, unspecified: Secondary | ICD-10-CM | POA: Diagnosis not present

## 2014-12-02 DIAGNOSIS — E119 Type 2 diabetes mellitus without complications: Secondary | ICD-10-CM

## 2014-12-02 LAB — BASIC METABOLIC PANEL WITH GFR
BUN: 10 mg/dL (ref 7–25)
CO2: 23 mmol/L (ref 20–31)
Calcium: 9.6 mg/dL (ref 8.6–10.3)
Chloride: 100 mmol/L (ref 98–110)
Creat: 0.6 mg/dL — ABNORMAL LOW (ref 0.70–1.25)
GFR, Est Non African American: >89 mL/min (ref >=60)
GLUCOSE: 241 mg/dL — AB (ref 65–99)
POTASSIUM: 4.2 mmol/L (ref 3.5–5.3)
Sodium: 137 mmol/L (ref 135–146)

## 2014-12-02 LAB — CBC WITH DIFFERENTIAL/PLATELET
BASOS ABS: 0 10*3/uL (ref 0.0–0.1)
BASOS PCT: 0 % (ref 0–1)
EOS ABS: 0.1 10*3/uL (ref 0.0–0.7)
Eosinophils Relative: 1 % (ref 0–5)
HEMATOCRIT: 38.9 % — AB (ref 39.0–52.0)
HEMOGLOBIN: 13.2 g/dL (ref 13.0–17.0)
Lymphocytes Relative: 21 % (ref 12–46)
Lymphs Abs: 2.4 10*3/uL (ref 0.7–4.0)
MCH: 28.8 pg (ref 26.0–34.0)
MCHC: 33.9 g/dL (ref 30.0–36.0)
MCV: 84.9 fL (ref 78.0–100.0)
MONOS PCT: 3 % (ref 3–12)
MPV: 10.1 fL (ref 8.6–12.4)
Monocytes Absolute: 0.3 10*3/uL (ref 0.1–1.0)
NEUTROS ABS: 8.5 10*3/uL — AB (ref 1.7–7.7)
NEUTROS PCT: 75 % (ref 43–77)
Platelets: 226 10*3/uL (ref 150–400)
RBC: 4.58 MIL/uL (ref 4.22–5.81)
RDW: 17.5 % — AB (ref 11.5–15.5)
WBC: 11.3 10*3/uL — AB (ref 4.0–10.5)

## 2014-12-02 LAB — HEPATIC FUNCTION PANEL
ALBUMIN: 4.1 g/dL (ref 3.6–5.1)
ALT: 16 U/L (ref 9–46)
AST: 14 U/L (ref 10–35)
Alkaline Phosphatase: 73 U/L (ref 40–115)
Bilirubin, Direct: 0.1 mg/dL (ref <=0.2)
Indirect Bilirubin: 0.5 mg/dL (ref 0.2–1.2)
TOTAL PROTEIN: 6.8 g/dL (ref 6.1–8.1)
Total Bilirubin: 0.6 mg/dL (ref 0.2–1.2)

## 2014-12-02 LAB — LIPID PANEL
Cholesterol: 141 mg/dL (ref 125–200)
HDL: 32 mg/dL — ABNORMAL LOW (ref >=40)
LDL CALC: 45 mg/dL (ref <130)
TRIGLYCERIDES: 321 mg/dL — AB (ref <150)
Total CHOL/HDL Ratio: 4.4 Ratio (ref <=5.0)
VLDL: 64 mg/dL — ABNORMAL HIGH (ref <30)

## 2014-12-02 LAB — TSH: TSH: 2.605 u[IU]/mL (ref 0.350–4.500)

## 2014-12-02 LAB — MAGNESIUM: Magnesium: 1.9 mg/dL (ref 1.5–2.5)

## 2014-12-02 NOTE — Progress Notes (Signed)
Assessment and Plan:  1. Hypertension -Continue medication, monitor blood pressure at home. Continue DASH diet.  Reminder to go to the ER if any CP, SOB, nausea, dizziness, severe HA, changes vision/speech, left arm numbness and tingling and jaw pain.  2. Cholesterol -Continue diet and exercise. Check cholesterol.   3. diabetes  -Continue diet and exercise. Check A1C  4. Vitamin D Def - check level and continue medications.   5. Obesity with co morbidities - long discussion about weight loss, diet, and exercise Discussed how weight loss would help his back/legs  Continue diet and meds as discussed. Further disposition pending results of labs. Over 30 minutes of exam, counseling, chart review, and critical decision making was performed  HPI 69 y.o. male  presents for 3 month follow up on hypertension, cholesterol, prediabetes, and vitamin D deficiency.   His blood pressure has been controlled at home, today their BP is BP: 122/80 mmHg  He does not workout. He denies chest pain, shortness of breath, dizziness.  He is on cholesterol medication and denies myalgias. His cholesterol is at goal. The cholesterol last visit was:   Lab Results  Component Value Date   CHOL 138 08/28/2014   HDL 56 08/28/2014   LDLCALC 61 08/28/2014   LDLDIRECT 178.6 09/08/2009   TRIG 107 08/28/2014   CHOLHDL 2.5 08/28/2014    He has been working on diet and exercise for diabetes,  Lab Results  Component Value Date   GFRNONAA >89 08/28/2014   He is on bASA, he is not on ACE due to hypotension and denies paresthesia of the feet, polydipsia, polyuria and visual disturbances. Last A1C in the office was:  Lab Results  Component Value Date   HGBA1C 7.9* 08/28/2014   Patient is on Vitamin D supplement.   Lab Results  Component Value Date   VD25OH 27 05/27/2014     He has lower back pain and RSD, follows with Dr. Mechele Dawley at [facility], he walk with a walker and complains of pain. He also follows  with Dr. Lenna Gilford for RA.  BMI is Body mass index is 31.85 kg/(m^2)., he is working on diet and exercise. Wt Readings from Last 3 Encounters:  12/02/14 208 lb (94.348 kg)  11/04/14 215 lb (97.523 kg)  08/28/14 217 lb (98.431 kg)    Current Medications:  Current Outpatient Prescriptions on File Prior to Visit  Medication Sig Dispense Refill  . celecoxib (CELEBREX) 200 MG capsule Take 200 mg by mouth 2 (two) times daily.  2  . Cholecalciferol (VITAMIN D-3) 5000 UNITS TABS Take 5,000 Units by mouth 2 (two) times daily.    . cloNIDine (CATAPRES) 0.1 MG tablet Take 0.1 mg by mouth 2 (two) times daily.    . cyclobenzaprine (FLEXERIL) 10 MG tablet Take 10 mg by mouth 3 (three) times daily as needed for muscle spasms. Muscle spasm.    . docusate sodium 100 MG CAPS Take 100 mg by mouth 2 (two) times daily. (Patient taking differently: Take 100 mg by mouth 2 (two) times daily. prn) 10 capsule 0  . DULoxetine (CYMBALTA) 60 MG capsule Take 60 mg by mouth daily.     . empagliflozin (JARDIANCE) 10 MG TABS tablet Take 10 mg by mouth daily. 30 tablet 3  . fentaNYL 0.05 MG/ML SOLN 5,000 mcg Inject into the skin continuous. Patient will bring dosage instructions with him. Daily dose 0.84mg     . folic acid (FOLVITE) 1 MG tablet Take 1 mg by mouth daily.  1  . glimepiride (AMARYL) 4 MG tablet Take 1 tablet (4 mg total) by mouth daily with breakfast. 90 tablet 0  . guaiFENesin-dextromethorphan (ROBITUSSIN DM) 100-10 MG/5ML syrup Take 5 mLs by mouth every 4 (four) hours as needed for cough. 118 mL 0  . lidocaine (LIDODERM) 5 % Place 1 patch onto the skin daily. 30 patch 0  . LYRICA 150 MG capsule Take 150 mg by mouth 2 (two) times daily.     . metFORMIN (GLUCOPHAGE) 500 MG tablet TAKE TWO TABLETS BY MOUTH TWICE DAILY 360 tablet 0  . methotrexate (RHEUMATREX) 2.5 MG tablet   1  . oxyCODONE-acetaminophen (PERCOCET) 10-325 MG per tablet Take 1 tablet by mouth every 4 (four) hours as needed for pain.    [name]  oxyCODONE-acetaminophen (ROXICET) 5-325 MG per tablet Take 1 tablet by mouth every 4 (four) hours as needed. 60 tablet 0  . polyethylene glycol (MIRALAX / GLYCOLAX) packet Take 17 g by mouth daily. 14 each 0  . simvastatin (ZOCOR) 40 MG tablet Take 40 mg by mouth daily.    . tamsulosin (FLOMAX) 0.4 MG CAPS capsule Take 0.4 mg by mouth daily after supper.     . traMADol (ULTRAM) 50 MG tablet Take 1 tablet (50 mg total) by mouth every 6 (six) hours as needed for moderate pain. pain 30 tablet 0   No current facility-administered medications on file prior to visit.   Medical History:  Past Medical History  Diagnosis Date  . Anemia   . Arthritis   . Blood transfusion   . Clotting disorder     bleeds easily-no dx  . Diabetes mellitus   . Borderline hypertension   . Venous insufficiency   . Hyperlipidemia   . Obesity   . Diverticulosis of colon   . Hx of colonic polyps   . Low back pain   . Lipoma   . RSD (reflex sympathetic dystrophy)   . Mental disorder   . Depression     hx of   . COPD (chronic obstructive pulmonary disease)     denies  . Pneumonia     hx  . Full dentures    Allergies:  Allergies  Allergen Reactions  . Codeine Itching  . Morphine And Related Other (See Comments)    hallucinations     Review of Systems:  Review of Systems  Constitutional: Negative for fever, chills and malaise/fatigue.  HENT: Negative for congestion, ear pain and sore throat.   Eyes: Negative.   Respiratory: Negative for cough, shortness of breath and wheezing.   Cardiovascular: Negative for chest pain, palpitations and leg swelling.  Gastrointestinal: Positive for constipation. Negative for heartburn, nausea, vomiting, abdominal pain, diarrhea, blood in stool and melena.  Genitourinary: Negative for dysuria, urgency and frequency.  Musculoskeletal: Positive for myalgias, back pain, joint pain and neck pain. Negative for falls.  Skin: Negative.   Neurological: Negative for dizziness,  sensory change and headaches.  Psychiatric/Behavioral: Negative for depression. The patient is not nervous/anxious.     Family history- Review and unchanged Social history- Review and unchanged Physical Exam: BP 122/80 mmHg  Pulse 92  Temp(Src) 97.1 F (36.2 C)  Resp 16  Ht 5' 7.75" (1.721 m)  Wt 208 lb (94.348 kg)  BMI 31.85 kg/m2 Wt Readings from Last 3 Encounters:  12/02/14 208 lb (94.348 kg)  11/04/14 215 lb (97.523 kg)  08/28/14 217 lb (98.431 kg)   General Appearance: Well nourished, in no apparent distress. Eyes: PERRLA, EOMs, conjunctiva  no swelling or erythema Sinuses: No Frontal/maxillary tenderness ENT/Mouth: Ext aud canals clear, TMs without erythema, bulging. No erythema, swelling, or exudate on post pharynx.  Tonsils not swollen or erythematous. Hearing normal.  Neck: Supple, thyroid normal.  Respiratory: Respiratory effort normal, BS equal bilaterally without rales, rhonchi, wheezing or stridor.  Cardio: RRR with no MRGs. Brisk peripheral pulses without edema.  Abdomen: Soft, + BS,  Non tender, no guarding, rebound, hernias, masses. Lymphatics: Non tender without lymphadenopathy.  Musculoskeletal: Full ROM, 4/5 strength, Shuffling gait and Ambulates with walker Skin: Warm, dry without rashes, lesions, ecchymosis.  Neuro: Cranial nerves intact. Normal muscle tone, no cerebellar symptoms. Psych: Awake and oriented X 3, normal affect, Insight and Judgment appropriate.    Vicie Mutters, PA-C 12:10 PM [facility]

## 2014-12-02 NOTE — Patient Instructions (Signed)
Diabetes is a very complicated disease...lets simplify it.  An easy way to look at it to understand the complications is if you think of the extra sugar floating in your blood stream as glass shards floating through your blood stream.    Diabetes affects your small vessels first: 1) The glass shards (sugar) scraps down the tiny blood vessels in your eyes and lead to diabetic retinopathy, the leading cause of blindness in the [place]. Diabetes is the leading cause of newly diagnosed adult (20 to 69 years of age) blindness in the [place].  2) The glass shards scratches down the tiny vessels of your legs leading to nerve damage called neuropathy and can lead to amputations of your feet. More than 60% of all non-traumatic amputations of lower limbs occur in people with diabetes.  3) Over time the small vessels in your brain are shredded and closed off, individually this does not cause any problems but over a long period of time many of the small vessels being blocked can lead to Vascular Dementia.   4) Your kidney's are a filter system and have a "net" that keeps certain things in the body and lets bad things out. Sugar shreds this net and leads to kidney damage and eventually failure. Decreasing the sugar that is destroying the net and certain blood pressure medications can help stop or decrease progression of kidney disease. Diabetes was the primary cause of kidney failure in 44 percent of all new cases in 2011.  5) Diabetes also destroys the small vessels in your penis that lead to erectile dysfunction. Eventually the vessels are so damaged that you may not be responsive to cialis or viagra.   Diabetes and your large vessels: Your larger vessels consist of your coronary arteries in your heart and the carotid vessels to your brain. Diabetes or even increased sugars put you at 300% increased risk of heart attack and stroke and this is why.. The sugar scrapes down your large blood vessels and your body  sees this as an internal injury and tries to repair itself. Just like you get a scab on your skin, your platelets will stick to the blood vessel wall trying to heal it. This is why we have diabetics on low dose aspirin daily, this prevents the platelets from sticking and can prevent plaque formation. In addition, your body takes cholesterol and tries to shove it into the open wound. This is why we want your LDL, or bad cholesterol, below 70.   The combination of platelets and cholesterol over 5-10 years forms plaque that can break off and cause a heart attack or stroke.   PLEASE REMEMBER:  Diabetes is preventable! Up to 85 percent of complications and morbidities among individuals with type 2 diabetes can be prevented, delayed, or effectively treated and minimized with regular visits to a health professional, appropriate monitoring and medication, and a healthy diet and lifestyle.  Recommendations For Diabetic/Prediabetic Patients:   -  Take medications as prescribed  -  Recommend Dr Joel Fuhrman's book "The End of Diabetes "  And "The End of Dieting"- Can get at  www.Amazon.com and encourage also get the Audio CD book  - AVOID Animal products, ie. Meat - red/white, Poultry and Dairy/especially cheese - Exercise at least 5 times a week for 30 minutes or preferably daily.  - No Smoking - Drink less than 2 drinks a day.  - Monitor your feet for sores - Have yearly Eye Exams - Recommend annual Flu vaccine  -   Recommend Pneumovax and Prevnar vaccines - Shingles Vaccine (Zostavax) if over 60 y.o.  Goals:   - BMI less than 24 - Fasting sugar less than 130 or less than 150 if tapering medicines to lose weight  - Systolic BP less than 130  - Diastolic BP less than 80 - Bad LDL Cholesterol less than 70 - Triglycerides less than 150   

## 2014-12-03 LAB — VITAMIN D 25 HYDROXY (VIT D DEFICIENCY, FRACTURES): VIT D 25 HYDROXY: 32 ng/mL (ref 30–100)

## 2014-12-03 LAB — INSULIN, RANDOM: INSULIN: 4.4 u[IU]/mL (ref 2.0–19.6)

## 2014-12-03 LAB — HEMOGLOBIN A1C
Hgb A1c MFr Bld: 11.6 % — ABNORMAL HIGH (ref <5.7)
MEAN PLASMA GLUCOSE: 286 mg/dL — AB (ref <117)

## 2014-12-11 DIAGNOSIS — M25561 Pain in right knee: Secondary | ICD-10-CM | POA: Diagnosis not present

## 2014-12-16 ENCOUNTER — Other Ambulatory Visit (HOSPITAL_COMMUNITY): Payer: Self-pay | Admitting: Orthopedic Surgery

## 2014-12-16 DIAGNOSIS — T8484XA Pain due to internal orthopedic prosthetic devices, implants and grafts, initial encounter: Secondary | ICD-10-CM

## 2014-12-16 DIAGNOSIS — Z96659 Presence of unspecified artificial knee joint: Principal | ICD-10-CM

## 2014-12-30 ENCOUNTER — Encounter (HOSPITAL_COMMUNITY)
Admission: RE | Admit: 2014-12-30 | Discharge: 2014-12-30 | Disposition: A | Discharge status: Home / Self Care | Payer: Medicare PPO | Source: Ambulatory Visit | Attending: Orthopedic Surgery | Admitting: Orthopedic Surgery

## 2014-12-30 DIAGNOSIS — Z96659 Presence of unspecified artificial knee joint: Secondary | ICD-10-CM

## 2014-12-30 DIAGNOSIS — Z96651 Presence of right artificial knee joint: Secondary | ICD-10-CM | POA: Insufficient documentation

## 2014-12-30 DIAGNOSIS — T8484XA Pain due to internal orthopedic prosthetic devices, implants and grafts, initial encounter: Secondary | ICD-10-CM

## 2014-12-30 DIAGNOSIS — M25561 Pain in right knee: Secondary | ICD-10-CM | POA: Diagnosis not present

## 2014-12-30 DIAGNOSIS — M25551 Pain in right hip: Secondary | ICD-10-CM | POA: Diagnosis not present

## 2014-12-30 MED ORDER — TECHNETIUM TC 99M MEDRONATE IV KIT
24.6000 | PACK | Freq: Once | INTRAVENOUS | Status: AC | PRN
  Administered 2014-12-30: 24.6 via INTRAVENOUS

## 2015-01-06 DIAGNOSIS — M2550 Pain in unspecified joint: Secondary | ICD-10-CM | POA: Diagnosis not present

## 2015-01-06 DIAGNOSIS — M545 Low back pain: Secondary | ICD-10-CM | POA: Diagnosis not present

## 2015-01-06 DIAGNOSIS — G8929 Other chronic pain: Secondary | ICD-10-CM | POA: Diagnosis not present

## 2015-01-06 DIAGNOSIS — M7989 Other specified soft tissue disorders: Secondary | ICD-10-CM | POA: Diagnosis not present

## 2015-01-08 DIAGNOSIS — H35341 Macular cyst, hole, or pseudohole, right eye: Secondary | ICD-10-CM | POA: Diagnosis not present

## 2015-01-08 DIAGNOSIS — E119 Type 2 diabetes mellitus without complications: Secondary | ICD-10-CM | POA: Diagnosis not present

## 2015-01-08 DIAGNOSIS — H43813 Vitreous degeneration, bilateral: Secondary | ICD-10-CM | POA: Diagnosis not present

## 2015-01-20 ENCOUNTER — Other Ambulatory Visit: Payer: Self-pay | Admitting: Physician Assistant

## 2015-02-06 ENCOUNTER — Other Ambulatory Visit: Payer: Self-pay | Admitting: Pain Medicine

## 2015-02-06 DIAGNOSIS — M5417 Radiculopathy, lumbosacral region: Secondary | ICD-10-CM

## 2015-02-11 ENCOUNTER — Ambulatory Visit
Admission: RE | Admit: 2015-02-11 | Discharge: 2015-02-11 | Disposition: A | Discharge status: Home / Self Care | Payer: Worker's Compensation | Source: Ambulatory Visit | Attending: Pain Medicine | Admitting: Pain Medicine

## 2015-02-11 VITALS — BP 129/68 | HR 92

## 2015-02-11 DIAGNOSIS — M48061 Spinal stenosis, lumbar region without neurogenic claudication: Secondary | ICD-10-CM

## 2015-02-11 DIAGNOSIS — M5417 Radiculopathy, lumbosacral region: Secondary | ICD-10-CM

## 2015-02-11 DIAGNOSIS — M961 Postlaminectomy syndrome, not elsewhere classified: Secondary | ICD-10-CM

## 2015-02-11 MED ORDER — DIAZEPAM 5 MG PO TABS
5.0000 mg | ORAL_TABLET | Freq: Once | ORAL | Status: AC
Start: 2015-02-11 — End: 2015-02-11
  Administered 2015-02-11: 5 mg via ORAL

## 2015-02-11 MED ORDER — IOHEXOL 300 MG/ML  SOLN
10.0000 mL | Freq: Once | INTRAMUSCULAR | Status: DC | PRN
  Administered 2015-02-11: 10 mL via INTRATHECAL

## 2015-02-11 NOTE — Discharge Instructions (Signed)
Myelogram Discharge Instructions  1. Go home and rest quietly for the next 24 hours.  It is important to lie flat for the next 24 hours.  Get up only to go to the restroom.  You may lie in the bed or on a couch on your back, your stomach, your left side or your right side.  You may have one pillow under your head.  You may have pillows between your knees while you are on your side or under your knees while you are on your back.  2. DO NOT drive today.  Recline the seat as far back as it will go, while still wearing your seat belt, on the way home.  3. You may get up to go to the bathroom as needed.  You may sit up for 10 minutes to eat.  You may resume your normal diet and medications unless otherwise indicated.  Drink lots of extra fluids today and tomorrow.  4. The incidence of headache, nausea, or vomiting is about 5% (one in 20 patients).  If you develop a headache, lie flat and drink plenty of fluids until the headache goes away.  Caffeinated beverages may be helpful.  If you develop severe nausea and vomiting or a headache that does not go away with flat bed rest, call [phone].  5. You may resume normal activities after your 24 hours of bed rest is over; however, do not exert yourself strongly or do any heavy lifting tomorrow. If when you get up you have a headache when standing, go back to bed and force fluids for another 24 hours.  6. Call your physician for a follow-up appointment.  The results of your myelogram will be sent directly to your physician by the following day.  7. If you have any questions or if complications develop after you arrive home, please call [phone].  Discharge instructions have been explained to the patient.  The patient, or the person responsible for the patient, fully understands these instructions.       May resume Cymbalta and Tramadol on Nov. 3, 2016, after 1:00 pm.

## 2015-02-11 NOTE — Progress Notes (Signed)
Pt states he has not taken Cymbalta and Tramadol for the past 2 days.  Discharge instructions explained to pt.

## 2015-03-10 ENCOUNTER — Encounter: Payer: Self-pay | Admitting: Internal Medicine

## 2015-03-10 ENCOUNTER — Ambulatory Visit (INDEPENDENT_AMBULATORY_CARE_PROVIDER_SITE_OTHER): Payer: Medicare PPO | Admitting: Internal Medicine

## 2015-03-10 VITALS — BP 118/80 | HR 92 | Temp 97.7°F | Resp 16 | Ht 67.75 in | Wt 209.1 lb

## 2015-03-10 DIAGNOSIS — Z125 Encounter for screening for malignant neoplasm of prostate: Secondary | ICD-10-CM | POA: Diagnosis not present

## 2015-03-10 DIAGNOSIS — I10 Essential (primary) hypertension: Secondary | ICD-10-CM | POA: Diagnosis not present

## 2015-03-10 DIAGNOSIS — Z6832 Body mass index (BMI) 32.0-32.9, adult: Secondary | ICD-10-CM

## 2015-03-10 DIAGNOSIS — G894 Chronic pain syndrome: Secondary | ICD-10-CM | POA: Diagnosis not present

## 2015-03-10 DIAGNOSIS — E669 Obesity, unspecified: Secondary | ICD-10-CM | POA: Diagnosis not present

## 2015-03-10 DIAGNOSIS — G9050 Complex regional pain syndrome I, unspecified: Secondary | ICD-10-CM | POA: Diagnosis not present

## 2015-03-10 DIAGNOSIS — E782 Mixed hyperlipidemia: Secondary | ICD-10-CM | POA: Diagnosis not present

## 2015-03-10 DIAGNOSIS — J410 Simple chronic bronchitis: Secondary | ICD-10-CM | POA: Diagnosis not present

## 2015-03-10 DIAGNOSIS — Z9181 History of falling: Secondary | ICD-10-CM

## 2015-03-10 DIAGNOSIS — Z0001 Encounter for general adult medical examination with abnormal findings: Secondary | ICD-10-CM | POA: Diagnosis not present

## 2015-03-10 DIAGNOSIS — E1121 Type 2 diabetes mellitus with diabetic nephropathy: Secondary | ICD-10-CM

## 2015-03-10 DIAGNOSIS — Z789 Other specified health status: Secondary | ICD-10-CM | POA: Diagnosis not present

## 2015-03-10 DIAGNOSIS — Z79899 Other long term (current) drug therapy: Secondary | ICD-10-CM

## 2015-03-10 DIAGNOSIS — Z1389 Encounter for screening for other disorder: Secondary | ICD-10-CM

## 2015-03-10 DIAGNOSIS — E1129 Type 2 diabetes mellitus with other diabetic kidney complication: Secondary | ICD-10-CM | POA: Diagnosis not present

## 2015-03-10 DIAGNOSIS — Z0000 Encounter for general adult medical examination without abnormal findings: Secondary | ICD-10-CM | POA: Insufficient documentation

## 2015-03-10 DIAGNOSIS — R6889 Other general symptoms and signs: Secondary | ICD-10-CM

## 2015-03-10 DIAGNOSIS — Z1212 Encounter for screening for malignant neoplasm of rectum: Secondary | ICD-10-CM

## 2015-03-10 DIAGNOSIS — N320 Bladder-neck obstruction: Secondary | ICD-10-CM

## 2015-03-10 DIAGNOSIS — R296 Repeated falls: Secondary | ICD-10-CM

## 2015-03-10 DIAGNOSIS — E559 Vitamin D deficiency, unspecified: Secondary | ICD-10-CM

## 2015-03-10 DIAGNOSIS — Z1331 Encounter for screening for depression: Secondary | ICD-10-CM

## 2015-03-10 LAB — HEPATIC FUNCTION PANEL
ALT: 12 U/L (ref 9–46)
AST: 11 U/L (ref 10–35)
Albumin: 4.4 g/dL (ref 3.6–5.1)
Alkaline Phosphatase: 102 U/L (ref 40–115)
BILIRUBIN DIRECT: 0.1 mg/dL (ref <=0.2)
BILIRUBIN INDIRECT: 0.5 mg/dL (ref 0.2–1.2)
Total Bilirubin: 0.6 mg/dL (ref 0.2–1.2)
Total Protein: 7.1 g/dL (ref 6.1–8.1)

## 2015-03-10 LAB — HEMOGLOBIN A1C
HEMOGLOBIN A1C: 10.5 % — AB (ref <5.7)
Mean Plasma Glucose: 255 mg/dL — ABNORMAL HIGH (ref <117)

## 2015-03-10 LAB — CBC WITH DIFFERENTIAL/PLATELET
BASOS ABS: 0 10*3/uL (ref 0.0–0.1)
BASOS PCT: 0 % (ref 0–1)
Eosinophils Absolute: 0.2 10*3/uL (ref 0.0–0.7)
Eosinophils Relative: 2 % (ref 0–5)
HCT: 38.5 % — ABNORMAL LOW (ref 39.0–52.0)
HEMOGLOBIN: 12.6 g/dL — AB (ref 13.0–17.0)
Lymphocytes Relative: 32 % (ref 12–46)
Lymphs Abs: 2.9 10*3/uL (ref 0.7–4.0)
MCH: 28.1 pg (ref 26.0–34.0)
MCHC: 32.7 g/dL (ref 30.0–36.0)
MCV: 85.9 fL (ref 78.0–100.0)
MPV: 10.5 fL (ref 8.6–12.4)
Monocytes Absolute: 0.3 10*3/uL (ref 0.1–1.0)
Monocytes Relative: 3 % (ref 3–12)
NEUTROS ABS: 5.8 10*3/uL (ref 1.7–7.7)
NEUTROS PCT: 63 % (ref 43–77)
Platelets: 240 10*3/uL (ref 150–400)
RBC: 4.48 MIL/uL (ref 4.22–5.81)
RDW: 14.5 % (ref 11.5–15.5)
WBC: 9.2 10*3/uL (ref 4.0–10.5)

## 2015-03-10 LAB — BASIC METABOLIC PANEL WITH GFR
BUN: 14 mg/dL (ref 7–25)
CALCIUM: 9.6 mg/dL (ref 8.6–10.3)
CHLORIDE: 96 mmol/L — AB (ref 98–110)
CO2: 26 mmol/L (ref 20–31)
Creat: 0.67 mg/dL — ABNORMAL LOW (ref 0.70–1.25)
GFR, Est African American: >89 mL/min (ref >=60)
Glucose, Bld: 234 mg/dL — ABNORMAL HIGH (ref 65–99)
Potassium: 4.5 mmol/L (ref 3.5–5.3)
SODIUM: 134 mmol/L — AB (ref 135–146)

## 2015-03-10 LAB — MAGNESIUM: Magnesium: 1.8 mg/dL (ref 1.5–2.5)

## 2015-03-10 LAB — LIPID PANEL
CHOL/HDL RATIO: 3.8 ratio (ref <=5.0)
CHOLESTEROL: 160 mg/dL (ref 125–200)
HDL: 42 mg/dL (ref >=40)
LDL Cholesterol: 86 mg/dL (ref <130)
Triglycerides: 162 mg/dL — ABNORMAL HIGH (ref <150)
VLDL: 32 mg/dL — AB (ref <30)

## 2015-03-10 LAB — TSH: TSH: 2.136 u[IU]/mL (ref 0.350–4.500)

## 2015-03-10 NOTE — Progress Notes (Signed)
Patient ID: James Parsons, male   DOB: 29-Nov-1945, 69 y.o.   MRN: [id number]  Medicare Annual Wellness Visit and  Comprehensive Evaluation & Examination   Assessment:   1. Essential hypertension  - EKG 12-Lead - [place], RETROPERITNL ABD,  LTD - TSH  2. Mixed hyperlipidemia  - Lipid panel - TSH  3. Controlled type 2 diabetes mellitus with diabetic nephropathy, without long-term current use of insulin (HCC)  - Microalbumin / creatinine urine ratio - HM DIABETES FOOT EXAM - LOW EXTREMITY NEUR EXAM DOCUM - Hemoglobin A1c - Insulin, random  4. Vitamin D deficiency  - VITAMIN D 25 Hydroxy   5. Chronic pain associated with significant psychosocial dysfunction   6. Bladder neck obstruction  - PSA  7. Obesity   8. Simple chronic bronchitis ([place])   9. Depression screen   10. Prostate cancer screening  - PSA  11. At low risk for fall   12. RSD (reflex sympathetic dystrophy)   13. Screening for rectal cancer  - POC Hemoccult Bld/Stl   14. Medicare annual wellness visit, subsequent   15. Medication management  - Urinalysis, Routine w reflex microscopic  - CBC with Differential/Platelet - BASIC METABOLIC PANEL WITH GFR - Hepatic function panel - Magnesium  16. BMI 32.0-32.9,adult   17. At high risk for falls   Plan:   During the course of the visit the patient was educated and counseled about appropriate screening and preventive services including:    Pneumococcal vaccine   Influenza vaccine  Td vaccine  Screening electrocardiogram  Bone densitometry screening  Colorectal cancer screening  Diabetes screening  Glaucoma screening  Nutrition counseling   Advanced directives: requested  Screening recommendations, referrals: Vaccinations: Immunization History  Administered Date(s) Administered  . Influenza Split 04/22/2011, 01/20/2012  . Influenza Whole 01/15/2007, 01/21/2009, 03/08/2010  . Influenza, High Dose Seasonal PF  02/07/2013 and 01/2015 at [facility]  . Influenza-Unspecified 12/10/2013, 01/10/2015  . Pneumococcal Polysaccharide-23 04/22/2011  . Tdap 04/11/2001, 05/07/2013  Prevnar vaccine out of stock Shingles vaccine undecided Hep B vaccine not indicated  Nutrition assessed and recommended  Colonoscopy 01/24/2013 Recommended yearly ophthalmology/optometry visit for glaucoma screening and checkup Recommended yearly dental visit for hygiene and checkup Advanced directives - yes  Conditions/risks identified: BMI: Discussed weight loss, diet, and increase physical activity.  Increase physical activity: AHA recommends 150 minutes of physical activity a week.  Medications reviewed Diabetes is not at goal, ACE/ARB therapy:  Nor indicated Urinary Incontinence is not an issue: discussed non pharmacology and pharmacology options.  Fall risk: high- discussed PT, home fall assessment, medications.   Subjective:        James Parsons  presents for [facility] Annual Wellness Visit and presents for a comprehensive evaluation, examination and management of multiple medical co-morbidities.  Date of last medicare wellness visit was 12/24/2013.  This very nice 69 y.o. MWM presents for also  follow up with Hypertension, Hyperlipidemia, MO, poorly controlled T2_DM  and Vitamin D Deficiency.       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 Currently he is on maintenance narcotic analgesics and  he has  a spinal cord stimulator since 2005 and a Fentanyl pump since about 2010. Currently he  is contemplating Another spinal decompression surg by Dr Rolena Infante.       Patient is treated for HTN since 2010 & BP has been controlled at home. Today's BP: 118/80 mmHg. Patient has had no  complaints of any cardiac type chest pain, palpitations, dyspnea/orthopnea/PND, dizziness, claudication, or dependent edema.      Hyperlipidemia is controlled with diet & meds. Patient denies myalgias or other med SE's. Last Lipids were at goal with Cholesterol 141; HDL 32*; LDL 45; but very elevated Triglycerides 321 on 12/02/2014.      Also, the patient has history of Morbid Obesity (BMI 32+) and consequent T2_NIDDM since 2007 and has had no symptoms of reactive hypoglycemia, diabetic polys, paresthesias or visual blurring.  Last A1c was not at goal and was very elevated at 11.6% on 12/02/2014.        Further, the patient also has history of Vitamin D Deficiency of 25 in 2015 and does not supplement as directed. Last vitamin D was still very low at 32 on 12/02/2014.  Names of Other Physician/Practitioners you currently use: 1. [facility] here for primary care 2. Dr Atha Starks, eye doctor, last visit 2015 3. No dentist, has full dentures.  Patient Care Team: Unk Pinto, MD as PCP - General (Internal Medicine) Sable Feil, MD as Consulting Physician (Gastroenterology) Clent Jacks, MD as Consulting Physician (Ophthalmology) Melina Schools, MD as Consulting Physician (Orthopedic Surgery) [place] L. Maryruth Eve, MD as Physician Assistant (Pain Medicine) Lennie Odor, MD as Referring Physician (Pain Medicine)  Medication Review: Medication Sig  . celecoxib (CELEBREX) 200 MG capsule Take 200 mg by mouth 2 (two) times daily.  . Cholecalciferol (VITAMIN D-3) 5000 UNITS TABS Take 5,000 Units by mouth 2 (two) times daily.  . cloNIDine (CATAPRES) 0.1 MG tablet Take 0.1 mg by mouth 2 (two) times daily.  . cyclobenzaprine (FLEXERIL) 10 MG tablet Take 10 mg by mouth 3 (three) times daily as needed for muscle spasms. Muscle spasm.  . docusate sodium 100 MG CAPS Take 100 mg by mouth 2 (two) times daily. (Patient taking differently: Take 100 mg by mouth 2 (two)  times daily. prn)  . DULoxetine (CYMBALTA) 60 MG capsule Take 60 mg by mouth daily.   . empagliflozin (JARDIANCE) 10 MG TABS tablet Take 10 mg by mouth daily.  . fentaNYL 0.05 MG/ML SOLN 5,000 mcg Inject into the skin continuous. Patient will bring dosage instructions with him. Daily dose 0.84mg   . folic acid (FOLVITE) 1 MG tablet Take 1 mg by mouth daily.  [name] glimepiride (AMARYL) 4 MG tablet Take 1 tablet (4 mg total) by mouth daily with breakfast.  . guaiFENesin-dextromethorphan (ROBITUSSIN DM) 100-10 MG/5ML syrup Take 5 mLs by mouth every 4 (four) hours as needed for cough.  . lidocaine (LIDODERM) 5 % Place 1 patch onto the skin daily.  [name] LYRICA 150 MG capsule Take 150 mg by mouth 2 (two) times daily.   . metFORMIN (GLUCOPHAGE) 500 MG tablet TAKE TWO TABLETS BY MOUTH TWICE DAILY  . methotrexate (RHEUMATREX) 2.5 MG tablet   . oxyCODONE-acetaminophen (PERCOCET) 10-325 MG per tablet Take 1 tablet by mouth every 4 (four) hours as needed for pain.  . simvastatin (ZOCOR) 40 MG tablet Take 40 mg by mouth daily.  . tamsulosin (FLOMAX) 0.4 MG CAPS capsule Take 0.4 mg by mouth daily after supper.   . traMADol (ULTRAM) 50 MG tablet Take 1 tablet (50 mg total) by mouth every 6 (six) hours as needed for  moderate pain. pain   Allergies  Allergen Reactions  . Codeine Itching  . Morphine And Related Other (See Comments)    hallucinations   Current Problems (verified) Patient Active Problem List   Diagnosis Date Noted  . Medicare annual wellness visit, subsequent 03/10/2015  . BMI 32.0-32.9,adult 03/10/2015  . RSD (reflex sympathetic dystrophy) 04/07/2014  . Obesity 04/07/2014  . Spinal cord stimulator status 03/31/2014  . History of lumbar laminectomy for spinal cord decompression 01/08/2014  . Chronic pain associated with significant psychosocial dysfunction 10/24/2013  . Presence of other specified functional implants 10/24/2013  . Vitamin D deficiency 09/18/2013  . Medication management  09/18/2013  . T2_NIDDM 11/02/2012  . Anemia 11/02/2012  . 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 LBP (low back pain) 10/09/2012  . Lumbar canal stenosis 05/17/2012  . Spinal stenosis of thoracic region 05/17/2012  . Cervical pain 05/17/2012  . DJD (degenerative joint disease) 04/24/2012  . Essential hypertension 03/20/2010  . COPD (chronic obstructive pulmonary disease) ([place]) 03/09/2009  . VENOUS INSUFFICIENCY 09/12/2008  . Diverticulosis 09/12/2008  . COLONIC POLYPS 02/22/2007  . Mixed hyperlipidemia 02/22/2007  . LOW BACK PAIN SYNDROME 02/22/2007    Screening Tests Health Maintenance  Topic Date Due  . Hepatitis C Screening  02/17/1946  . URINE MICROALBUMIN  02/28/1956  . ZOSTAVAX  02/27/2006  . PNA vac Low Risk Adult (2 of 2 - PCV13) 04/21/2012  . OPHTHALMOLOGY EXAM  01/05/2013  . FOOT EXAM  09/19/2014  . INFLUENZA VACCINE  11/10/2014  . HEMOGLOBIN A1C  06/04/2015  . COLONOSCOPY  01/25/2023  . TETANUS/TDAP  05/08/2023    Immunization History  Administered Date(s) Administered  . Influenza Split 04/22/2011, 01/20/2012  . Influenza Whole 01/15/2007, 01/21/2009, 03/08/2010  . Influenza, High Dose Seasonal PF 02/07/2013  . Influenza-Unspecified 12/10/2013, 01/10/2015  . Pneumococcal Polysaccharide-23 04/22/2011  . Tdap 04/11/2001, 05/07/2013    Preventative care: Last colonoscopy: 01/24/2013  Past Medical History  Diagnosis Date  . Anemia   . Arthritis   . Blood transfusion   . Clotting disorder (HCC)     bleeds easily-no dx  . Diabetes mellitus   . Borderline hypertension   . Venous insufficiency   . Hyperlipidemia   . Obesity   . Diverticulosis of colon   . Hx of colonic polyps   . Low back pain   . Lipoma   . RSD (reflex sympathetic dystrophy)   . Mental disorder   . Depression     hx of   . COPD (chronic obstructive pulmonary disease) ([place])     denies  . Pneumonia     hx  . Full dentures     Past Surgical History  Procedure  Laterality Date  . Back surgery  [id number]  . Morphine pump  2009    due to reaction to morphine/changed to fentanyl  . Excision of lipoma from right olecranon area  11/2000    Dr. Rise [name]  . Microdiscectomy and decompression  05/2002    Dr. Tonita Cong  . C3-4 anterior cervical discectomy and fusion with plating at c3-4  05/2006    Dr. Arnoldo Morale  . Spinal cord stimulator implanted      for pain per Dr. Maryruth Eve  . Subcut pain pump implanted    . Pain pump implantation      with fentanyl  . Tonsillectomy    . Colonoscopy w/ polypectomy    . Knee arthroscopy      right knee  . Total knee  arthroplasty  02/29/2012    Procedure: TOTAL KNEE ARTHROPLASTY;  Surgeon: Kerin Salen, MD;  Location: [facility];  Service: Orthopedics;  Laterality: Right;  . Lumbar laminectomy/decompression microdiscectomy N/A 01/08/2014    Procedure: L4-S1 Decompression with removal and reimplantation of spinal cord stimulator battery ;  Surgeon: Melina Schools, MD;  Location: [facility];  Service: Orthopedics;  Laterality: N/A;  . Knee arthroscopy Right 06/06/2014    Procedure: ARTHROSCOPY RIGHT KNEE WITH REMOVAL OF FIBROUS BANDS;  Surgeon: Kerin Salen, MD;  Location: [facility];  Service: Orthopedics;  Laterality: Right;    Risk Factors: Tobacco Social History  Substance Use Topics  . Smoking status: Former Smoker -- 3.00 packs/day for 40 years    Types: Cigarettes    Quit date: 05/25/2002  . Smokeless tobacco: Never Used  . Alcohol Use: No   He does not smoke.  Patient is a former smoker. Are there smokers in your home (other than you)?  No  Alcohol Current alcohol use: none  Caffeine Current caffeine use: coffee 1 cup /day  Exercise Current exercise: none  Nutrition/Diet Current diet: in general, an "unhealthy" diet  Cardiac risk factors: advanced age (older than 60 for men, 57 for women), diabetes mellitus, dyslipidemia, hypertension, male gender, obesity (BMI >= 30 kg/m2), sedentary lifestyle  and smoking/ tobacco exposure.  Depression Screen (Note: if answer to either of the following is "Yes", a more complete depression screening is indicated)   Q1: Over the past two weeks, have you felt down, depressed or hopeless? No  Q2: Over the past two weeks, have you felt little interest or pleasure in doing things? No  Have you lost interest or pleasure in daily life? No  Do you often feel hopeless? No  Do you cry easily over simple problems? No  Activities of Daily Living In your present state of health, do you have any difficulty performing the following activities?:  Driving? No Managing money?  No Feeding yourself? No Getting from bed to chair? No Climbing a flight of stairs? No Preparing food and eating?: No Bathing or showering? No Getting dressed: No Getting to the toilet? No Using the toilet:No Moving around from place to place: No In the past year have you fallen or had a near fall?:Yes reports multiple falls due to poor balance despite using a walker at all tomes.    Are you sexually active?  No  Do you have more than one partner?  No  Vision Difficulties: No  Hearing Difficulties: No Do you often ask people to speak up or repeat themselves? No Do you experience ringing or noises in your ears? No Do you have difficulty understanding soft or whispered voices? No  Cognition  Do you feel that you have a problem with memory?No  Do you often misplace items? No  Do you feel safe at home?  Yes  Advanced directives Does patient have a [place]? Yes Does patient have a Living Will? Yes  ROS: Constitutional: Denies fever, chills, weight loss/gain, headaches, insomnia, fatigue, night sweats or change in appetite. Eyes: Denies redness, blurred vision, diplopia, discharge, itchy or watery eyes.  ENT: Denies discharge, congestion, post nasal drip, epistaxis, sore throat, earache, hearing loss, dental pain, Tinnitus, Vertigo, Sinus pain or snoring.   Cardio: Denies chest pain, palpitations, irregular heartbeat, syncope, dyspnea, diaphoresis, orthopnea, PND, claudication or edema Respiratory: denies cough, dyspnea, DOE, pleurisy, hoarseness, laryngitis or wheezing.  Gastrointestinal: Denies dysphagia, heartburn, reflux, water brash, pain,  cramps, nausea, vomiting, bloating, diarrhea, constipation, hematemesis, melena, hematochezia, jaundice or hemorrhoids Genitourinary: Denies dysuria, frequency, urgency, nocturia, hesitancy, discharge, hematuria or flank pain Musculoskeletal: Denies arthralgia, myalgia, stiffness, Jt. Swelling, pain, limp or strain/sprain. Denies Falls. Skin: Denies puritis, rash, hives, warts, acne, eczema or change in skin lesion Neuro: No weakness, tremor, incoordination, spasms, paresthesia or pain Psychiatric: Denies confusion, memory loss or sensory loss. Denies Depression. Endocrine: Denies change in weight, skin, hair change, nocturia, and paresthesia, diabetic polys, visual blurring or hyper / hypo glycemic episodes.  Heme/Lymph: No excessive bleeding, bruising or enlarged lymph nodes.  Objective:     BP 118/80 mmHg  Pulse 92  Temp(Src) 97.7 F (36.5 C)  Resp 16  Ht 5' 7.75" (1.721 m)  Wt 209 lb 1.6 oz (94.847 kg)  BMI 32.02 kg/m2  General Appearance:  Alert  WD/WN, male  in no apparent distress. Eyes: PERRLA, EOMs nl, conjunctiva normal, normal fundi and vessels. Sinuses: No frontal/maxillary tenderness ENT/Mouth: EACs patent / TMs  nl. Nares clear without erythema, swelling, mucoid exudates. Oral hygiene is good. No erythema, swelling, or exudate. Tongue normal, non-obstructing. Tonsils not swollen or erythematous. Hearing normal.  Neck: Supple, thyroid normal. No bruits, nodes or JVD. Respiratory: Respiratory effort normal.  BS equal and clear bilateral without rales, rhonci, wheezing or stridor. Cardio: Heart sounds are normal with regular rate and rhythm and no murmurs, rubs or gallops. Peripheral  pulses are normal and equal bilaterally without edema. No aortic or femoral bruits. Chest: symmetric with normal excursions and percussion.  Abdomen: Flat, soft with nl bowel sounds. Nontender, no guarding, rebound, hernias, masses, or organomegaly.  Lymphatics: Non tender without lymphadenopathy.  Genitourinary: No hernias.Testes nl. DRE - prostate nl for age - smooth & firm w/o nodules. Musculoskeletal: Generalized decrease in muscle power, tone and bulk & sl BB gait. 6" vertical scar over the Rt. Patella. Vertical lumbar scar. Skin: Warm and dry without rashes, lesions, cyanosis, clubbing or  ecchymosis.  Neuro: Cranial nerves intact, reflexes 1 (+) equal bilaterally in UE& absent in LE's. No cerebellar symptoms. Sensation intact in LLE, but decreased in a circumferential stocking distribution below the Rt knee. Pysch: Alert and oriented X 3 with normal affect, insight and judgment appropriate.   Cognitive Testing  Alert? Yes  Normal Appearance? Yes  Oriented to person? Yes  Place? Yes   Time? Yes  Recall of three objects?  Yes  Can perform simple calculations? Yes  Displays appropriate judgment? Yes  Can read the correct time from a watch/clock? Yes  Medicare Attestation I have personally reviewed: The patient's medical and social history Their use of alcohol, tobacco or illicit drugs Their current medications and supplements The patient's functional ability including ADLs,fall risks, home safety risks, cognitive, and hearing and visual impairment Diet and physical activities Evidence for depression or mood disorders  The patient's weight, height, BMI, and visual acuity have been recorded in the chart.  I have made referrals, counseling, and provided education to the patient based on review of the above and I have provided the patient with a written personalized care plan for preventive services.  Over 40 minutes of exam, counseling, chart review was performed.   Onofre Gains  DAVID, MD   03/10/2015

## 2015-03-10 NOTE — Patient Instructions (Signed)

## 2015-03-11 LAB — URINALYSIS, ROUTINE W REFLEX MICROSCOPIC

## 2015-03-11 LAB — INSULIN, RANDOM: Insulin: 9.4 u[IU]/mL (ref 2.0–19.6)

## 2015-03-11 LAB — PSA: PSA: 0.3 ng/mL (ref <=4.00)

## 2015-03-11 LAB — VITAMIN D 25 HYDROXY (VIT D DEFICIENCY, FRACTURES): Vit D, 25-Hydroxy: 41 ng/mL (ref 30–100)

## 2015-03-12 ENCOUNTER — Other Ambulatory Visit: Payer: Self-pay | Admitting: Internal Medicine

## 2015-03-20 ENCOUNTER — Other Ambulatory Visit: Payer: Medicare PPO

## 2015-03-20 DIAGNOSIS — N390 Urinary tract infection, site not specified: Secondary | ICD-10-CM

## 2015-03-21 LAB — URINALYSIS, ROUTINE W REFLEX MICROSCOPIC
Bilirubin Urine: NEGATIVE
Ketones, ur: NEGATIVE
Nitrite: NEGATIVE
PH: 5.5 (ref 5.0–8.0)
Protein, ur: NEGATIVE
Specific Gravity, Urine: 1.026 (ref 1.001–1.035)

## 2015-03-21 LAB — URINALYSIS, MICROSCOPIC ONLY
BACTERIA UA: NONE SEEN [HPF]
CASTS: NONE SEEN [LPF]
CRYSTALS: NONE SEEN [HPF]
RBC / HPF: NONE SEEN RBC/HPF (ref <=2)

## 2015-03-24 ENCOUNTER — Other Ambulatory Visit: Payer: Self-pay | Admitting: Internal Medicine

## 2015-03-24 DIAGNOSIS — M2550 Pain in unspecified joint: Secondary | ICD-10-CM | POA: Diagnosis not present

## 2015-03-24 DIAGNOSIS — N390 Urinary tract infection, site not specified: Secondary | ICD-10-CM

## 2015-03-24 LAB — URINE CULTURE: Colony Count: >=100000

## 2015-03-24 MED ORDER — NITROFURANTOIN MONOHYD MACRO 100 MG PO CAPS: ORAL_CAPSULE | ORAL | Status: DC

## 2015-03-25 ENCOUNTER — Other Ambulatory Visit: Payer: Self-pay | Admitting: *Deleted

## 2015-03-25 DIAGNOSIS — N390 Urinary tract infection, site not specified: Secondary | ICD-10-CM

## 2015-03-25 MED ORDER — NITROFURANTOIN MONOHYD MACRO 100 MG PO CAPS: ORAL_CAPSULE | ORAL | Status: AC

## 2015-04-20 ENCOUNTER — Other Ambulatory Visit: Payer: Self-pay | Admitting: *Deleted

## 2015-04-20 DIAGNOSIS — Z1212 Encounter for screening for malignant neoplasm of rectum: Secondary | ICD-10-CM

## 2015-04-20 LAB — POC HEMOCCULT BLD/STL (HOME/3-CARD/SCREEN)
FECAL OCCULT BLD: NEGATIVE
FECAL OCCULT BLD: NEGATIVE
Fecal Occult Blood, POC: NEGATIVE

## 2015-04-27 ENCOUNTER — Ambulatory Visit (INDEPENDENT_AMBULATORY_CARE_PROVIDER_SITE_OTHER): Payer: Medicare PPO | Admitting: *Deleted

## 2015-04-27 DIAGNOSIS — R319 Hematuria, unspecified: Secondary | ICD-10-CM

## 2015-04-27 NOTE — Progress Notes (Signed)
Patient ID: James Parsons, male   DOB: May 10, 1945, 70 y.o.   MRN: [id number] Patient presents for 1 month recheck UA, C&S per Dr. Idell Pickles orders.  Patient denies any current UTI symptoms.

## 2015-04-28 LAB — URINALYSIS, ROUTINE W REFLEX MICROSCOPIC
BILIRUBIN URINE: NEGATIVE
Leukocytes, UA: NEGATIVE
Nitrite: NEGATIVE
PH: 5 (ref 5.0–8.0)
Protein, ur: NEGATIVE
SPECIFIC GRAVITY, URINE: 1.036 — AB (ref 1.001–1.035)

## 2015-04-28 LAB — URINALYSIS, MICROSCOPIC ONLY
BACTERIA UA: NONE SEEN [HPF]
CASTS: NONE SEEN [LPF]
Crystals: NONE SEEN [HPF]
SQUAMOUS EPITHELIAL / LPF: NONE SEEN [HPF] (ref <=5)
YEAST: NONE SEEN [HPF]

## 2015-04-28 LAB — URINE CULTURE
Colony Count: NO GROWTH
Organism ID, Bacteria: NO GROWTH

## 2015-06-09 ENCOUNTER — Encounter: Payer: Self-pay | Admitting: Internal Medicine

## 2015-06-09 ENCOUNTER — Ambulatory Visit (INDEPENDENT_AMBULATORY_CARE_PROVIDER_SITE_OTHER): Payer: Medicare Other | Admitting: Internal Medicine

## 2015-06-09 VITALS — BP 162/84 | HR 102 | Temp 98.0°F | Resp 18 | Ht 67.75 in | Wt 204.0 lb

## 2015-06-09 DIAGNOSIS — D126 Benign neoplasm of colon, unspecified: Secondary | ICD-10-CM | POA: Diagnosis not present

## 2015-06-09 DIAGNOSIS — K5730 Diverticulosis of large intestine without perforation or abscess without bleeding: Secondary | ICD-10-CM | POA: Diagnosis not present

## 2015-06-09 DIAGNOSIS — E559 Vitamin D deficiency, unspecified: Secondary | ICD-10-CM

## 2015-06-09 DIAGNOSIS — D649 Anemia, unspecified: Secondary | ICD-10-CM

## 2015-06-09 DIAGNOSIS — G9050 Complex regional pain syndrome I, unspecified: Secondary | ICD-10-CM

## 2015-06-09 DIAGNOSIS — R6889 Other general symptoms and signs: Secondary | ICD-10-CM

## 2015-06-09 DIAGNOSIS — Z0000 Encounter for general adult medical examination without abnormal findings: Secondary | ICD-10-CM

## 2015-06-09 DIAGNOSIS — M1990 Unspecified osteoarthritis, unspecified site: Secondary | ICD-10-CM

## 2015-06-09 DIAGNOSIS — Z0001 Encounter for general adult medical examination with abnormal findings: Secondary | ICD-10-CM | POA: Diagnosis not present

## 2015-06-09 DIAGNOSIS — M542 Cervicalgia: Secondary | ICD-10-CM

## 2015-06-09 DIAGNOSIS — Z9689 Presence of other specified functional implants: Secondary | ICD-10-CM

## 2015-06-09 DIAGNOSIS — E782 Mixed hyperlipidemia: Secondary | ICD-10-CM | POA: Diagnosis not present

## 2015-06-09 DIAGNOSIS — Z9889 Other specified postprocedural states: Secondary | ICD-10-CM

## 2015-06-09 DIAGNOSIS — G8929 Other chronic pain: Secondary | ICD-10-CM

## 2015-06-09 DIAGNOSIS — M4804 Spinal stenosis, thoracic region: Secondary | ICD-10-CM

## 2015-06-09 DIAGNOSIS — E669 Obesity, unspecified: Secondary | ICD-10-CM | POA: Diagnosis not present

## 2015-06-09 DIAGNOSIS — M545 Low back pain: Secondary | ICD-10-CM | POA: Diagnosis not present

## 2015-06-09 DIAGNOSIS — M961 Postlaminectomy syndrome, not elsewhere classified: Secondary | ICD-10-CM

## 2015-06-09 DIAGNOSIS — Z6832 Body mass index (BMI) 32.0-32.9, adult: Secondary | ICD-10-CM

## 2015-06-09 DIAGNOSIS — J410 Simple chronic bronchitis: Secondary | ICD-10-CM | POA: Diagnosis not present

## 2015-06-09 DIAGNOSIS — Z79899 Other long term (current) drug therapy: Secondary | ICD-10-CM

## 2015-06-09 DIAGNOSIS — E1121 Type 2 diabetes mellitus with diabetic nephropathy: Secondary | ICD-10-CM | POA: Diagnosis not present

## 2015-06-09 DIAGNOSIS — I872 Venous insufficiency (chronic) (peripheral): Secondary | ICD-10-CM

## 2015-06-09 DIAGNOSIS — I10 Essential (primary) hypertension: Secondary | ICD-10-CM

## 2015-06-09 DIAGNOSIS — Z23 Encounter for immunization: Secondary | ICD-10-CM

## 2015-06-09 DIAGNOSIS — G894 Chronic pain syndrome: Secondary | ICD-10-CM

## 2015-06-09 DIAGNOSIS — M4800 Spinal stenosis, site unspecified: Secondary | ICD-10-CM

## 2015-06-09 LAB — CBC WITH DIFFERENTIAL/PLATELET
BASOS ABS: 0 10*3/uL (ref 0.0–0.1)
Basophils Relative: 0 % (ref 0–1)
EOS PCT: 2 % (ref 0–5)
Eosinophils Absolute: 0.2 10*3/uL (ref 0.0–0.7)
HEMATOCRIT: 40.4 % (ref 39.0–52.0)
Hemoglobin: 13.6 g/dL (ref 13.0–17.0)
LYMPHS ABS: 1.9 10*3/uL (ref 0.7–4.0)
LYMPHS PCT: 19 % (ref 12–46)
MCH: 30.4 pg (ref 26.0–34.0)
MCHC: 33.7 g/dL (ref 30.0–36.0)
MCV: 90.2 fL (ref 78.0–100.0)
MPV: 10.3 fL (ref 8.6–12.4)
Monocytes Absolute: 0.4 10*3/uL (ref 0.1–1.0)
Monocytes Relative: 4 % (ref 3–12)
Neutro Abs: 7.6 10*3/uL (ref 1.7–7.7)
Neutrophils Relative %: 75 % (ref 43–77)
Platelets: 210 10*3/uL (ref 150–400)
RBC: 4.48 MIL/uL (ref 4.22–5.81)
RDW: 15.2 % (ref 11.5–15.5)
WBC: 10.1 10*3/uL (ref 4.0–10.5)

## 2015-06-09 LAB — HEMOGLOBIN A1C
HEMOGLOBIN A1C: 12.4 % — AB (ref <5.7)
Mean Plasma Glucose: 309 mg/dL — ABNORMAL HIGH (ref <117)

## 2015-06-09 LAB — HEPATIC FUNCTION PANEL
ALK PHOS: 96 U/L (ref 40–115)
ALT: 17 U/L (ref 9–46)
AST: 14 U/L (ref 10–35)
Albumin: 4.5 g/dL (ref 3.6–5.1)
BILIRUBIN DIRECT: 0.1 mg/dL (ref <=0.2)
BILIRUBIN INDIRECT: 0.5 mg/dL (ref 0.2–1.2)
BILIRUBIN TOTAL: 0.6 mg/dL (ref 0.2–1.2)
Total Protein: 7.3 g/dL (ref 6.1–8.1)

## 2015-06-09 LAB — BASIC METABOLIC PANEL WITH GFR
BUN: 9 mg/dL (ref 7–25)
CALCIUM: 10 mg/dL (ref 8.6–10.3)
CO2: 27 mmol/L (ref 20–31)
Chloride: 97 mmol/L — ABNORMAL LOW (ref 98–110)
Creat: 0.79 mg/dL (ref 0.70–1.25)
Glucose, Bld: 447 mg/dL — ABNORMAL HIGH (ref 65–99)
POTASSIUM: 6.1 mmol/L — AB (ref 3.5–5.3)
SODIUM: 135 mmol/L (ref 135–146)

## 2015-06-09 LAB — LIPID PANEL
Cholesterol: 146 mg/dL (ref 125–200)
HDL: 42 mg/dL (ref >=40)
LDL CALC: 59 mg/dL (ref <130)
TRIGLYCERIDES: 225 mg/dL — AB (ref <150)
Total CHOL/HDL Ratio: 3.5 Ratio (ref <=5.0)
VLDL: 45 mg/dL — AB (ref <30)

## 2015-06-09 LAB — TSH: TSH: 1.45 mIU/L (ref 0.40–4.50)

## 2015-06-09 NOTE — Progress Notes (Signed)
MEDICARE ANNUAL WELLNESS VISIT AND FOLLOW UP Assessment:    1. Essential hypertension -cont meds -recheck in the office okay at 138/84 -monitor at home -dash diet -weight loss - TSH  2. Venous (peripheral) insufficiency -cont diet and exercise -compression stockings -tight cholesterol control  3. Need for prophylactic vaccination against Streptococcus pneumoniae (pneumococcus) - Pneumococcal conjugate vaccine 13-valent  4. Simple chronic bronchitis (HCC) -cont inhalers -former smoker  5. Benign neoplasm of colon, unspecified part of colon -cont monitor with colonoscopy  6. Diverticulosis of large intestine without hemorrhage -currently without flaire  7. Controlled type 2 diabetes mellitus with diabetic nephropathy, without long-term current use of insulin ([place]) -likely needs adjustment of medications and possible basal insulin - Hemoglobin A1c  8. RSD (reflex sympathetic dystrophy) -managed by pain management and ortho  9. Osteoarthritis, unspecified osteoarthritis type, unspecified site -currently managed by ortho  10. Obesity -diet and exercise encouraged  11. Mixed hyperlipidemia -cont tight cholesterol control - Lipid panel  12. Chronic low back pain, unspecified back pain laterality, with sciatica presence unspecified -followed by ortho and pain management  13. Anemia, unspecified anemia type -monitor cbcs  14. Vitamin D deficiency -cont supplement  15. Medication management  - CBC with Differential/Platelet - BASIC METABOLIC PANEL WITH GFR - Hepatic function panel  16. Failed back syndrome of lumbar spine Managed by ortho  17. Chronic pain associated with significant psychosocial dysfunction Managed by pain management  18. Spinal stenosis, multilevel -managed by ortho  19. Spinal stenosis of thoracic region Managed by ortho  20. History of lumbar laminectomy for spinal cord decompression -managed by ortho  21. Spinal cord  stimulator status -without current issues  22. Medicare annual wellness visit, subsequent -due next year  37. BMI 32.0-32.9,adult -diet and exercise discussed  24. Cervical pain -managed by ortho  25. Low back pain, unspecified back pain laterality, with sciatica presence unspecified -chronic pain with likely no further treatment options  26. Presence of other specified functional implants -see spinal stimulator       Over 30 minutes of exam, counseling, chart review, and critical decision making was performed  Plan:   During the course of the visit the patient was educated and counseled about appropriate screening and preventive services including:    Pneumococcal vaccine   Influenza vaccine  Prevnar 13  Td vaccine  Screening electrocardiogram  Colorectal cancer screening  Diabetes screening  Glaucoma screening  Nutrition counseling   Conditions/risks identified: BMI: Discussed weight loss, diet, and increase physical activity.  Increase physical activity: AHA recommends 150 minutes of physical activity a week.  Medications reviewed Diabetes is not at goal, ACE/ARB therapy: Yes. RA- on DMARD Methotrexate Urinary Incontinence is an issue: discussed non pharmacology and pharmacology options.  Fall risk: high- discussed PT, home fall assessment, medications.    Subjective:  James Parsons is a 70 y.o. male who presents for Medicare Annual Wellness Visit and 3 month follow up for HTN, hyperlipidemia, diabetes, and vitamin D Def.  Date of last medicare wellness visit was is unknown.  His blood pressure has been controlled at home, today their BP is BP: (!) 162/84 mmHg He does not workout. He denies chest pain, shortness of breath, dizziness.    He is on cholesterol medication and denies myalgias. His cholesterol is at goal. The cholesterol last visit was:   Lab Results  Component Value Date   CHOL 160 03/10/2015   HDL 42 03/10/2015   LDLCALC 86  03/10/2015   LDLDIRECT 178.6  09/08/2009   TRIG 162* 03/10/2015   CHOLHDL 3.8 03/10/2015   He has not been working on diet and exercise for diabetes, and denies foot ulcerations, hyperglycemia, hypoglycemia , increased appetite, nausea, paresthesia of the feet, polydipsia, polyuria, visual disturbances, vomiting and weight loss. Last A1C in the office was:  Lab Results  Component Value Date   HGBA1C 10.5* 03/10/2015   Last GFR NonAA   Lab Results  Component Value Date   [facility] >89 03/10/2015   AA  Lab Results  Component Value Date   GFRAA >89 03/10/2015   Patient is on Vitamin D supplement.   Lab Results  Component Value Date   VD25OH 26 03/10/2015     He is due to see Dr. Rolena Infante in May to determine whether he needs harrington rods for his back.  He reports that he is not sure whether he is going to proceed with it.  He reports that he is falling a lot now.     Medication Review: Current Outpatient Prescriptions on File Prior to Visit  Medication Sig Dispense Refill  . celecoxib (CELEBREX) 200 MG capsule Take 200 mg by mouth 2 (two) times daily.  2  . Cholecalciferol (VITAMIN D-3) 5000 UNITS TABS Take 5,000 Units by mouth 2 (two) times daily.    . cloNIDine (CATAPRES) 0.1 MG tablet Take 0.1 mg by mouth 2 (two) times daily.    . cyclobenzaprine (FLEXERIL) 10 MG tablet Take 10 mg by mouth 3 (three) times daily as needed for muscle spasms. Muscle spasm.    . docusate sodium 100 MG CAPS Take 100 mg by mouth 2 (two) times daily. (Patient taking differently: Take 100 mg by mouth 2 (two) times daily. prn) 10 capsule 0  . DULoxetine (CYMBALTA) 60 MG capsule Take 60 mg by mouth daily.     . empagliflozin (JARDIANCE) 10 MG TABS tablet Take 10 mg by mouth daily. 30 tablet 3  . fentaNYL 0.05 MG/ML SOLN 5,000 mcg Inject into the skin continuous. Patient will bring dosage instructions with him. Daily dose 0.84mg     . folic acid (FOLVITE) 1 MG tablet Take 1 mg by mouth daily.  1  .  glimepiride (AMARYL) 4 MG tablet Take 1 tablet (4 mg total) by mouth daily with breakfast. 90 tablet 0  . lidocaine (LIDODERM) 5 % Place 1 patch onto the skin daily. 30 patch 0  . LYRICA 150 MG capsule Take 150 mg by mouth 2 (two) times daily.     . metFORMIN (GLUCOPHAGE) 500 MG tablet TAKE TWO TABLETS BY MOUTH TWICE DAILY 360 tablet 1  . methotrexate (RHEUMATREX) 2.5 MG tablet   1  . oxyCODONE-acetaminophen (PERCOCET) 10-325 MG per tablet Take 1 tablet by mouth every 4 (four) hours as needed for pain.    . simvastatin (ZOCOR) 40 MG tablet Take 40 mg by mouth daily.    . tamsulosin (FLOMAX) 0.4 MG CAPS capsule Take 0.4 mg by mouth daily after supper.     . traMADol (ULTRAM) 50 MG tablet Take 1 tablet (50 mg total) by mouth every 6 (six) hours as needed for moderate pain. pain 30 tablet 0   No current facility-administered medications on file prior to visit.    Current Problems (verified) Patient Active Problem List   Diagnosis Date Noted  . Medicare annual wellness visit, subsequent 03/10/2015  . BMI 32.0-32.9,adult 03/10/2015  . RSD (reflex sympathetic dystrophy) 04/07/2014  . Obesity 04/07/2014  . Spinal cord stimulator status 03/31/2014  . History  of lumbar laminectomy for spinal cord decompression 01/08/2014  . Chronic pain associated with significant psychosocial dysfunction 10/24/2013  . Presence of other specified functional implants 10/24/2013  . Vitamin D deficiency 09/18/2013  . Medication management 09/18/2013  . T2_NIDDM 11/02/2012  . Anemia 11/02/2012  . 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 LBP (low back pain) 10/09/2012  . Lumbar canal stenosis 05/17/2012  . Spinal stenosis of thoracic region 05/17/2012  . Cervical pain 05/17/2012  . DJD (degenerative joint disease) 04/24/2012  . Essential hypertension 03/20/2010  . COPD (chronic obstructive pulmonary disease) ([place]) 03/09/2009  . VENOUS INSUFFICIENCY 09/12/2008  . Diverticulosis 09/12/2008  . COLONIC  POLYPS 02/22/2007  . Mixed hyperlipidemia 02/22/2007  . LOW BACK PAIN SYNDROME 02/22/2007    Screening Tests Immunization History  Administered Date(s) Administered  . Influenza Split 04/22/2011, 01/20/2012  . Influenza Whole 01/15/2007, 01/21/2009, 03/08/2010  . Influenza, High Dose Seasonal PF 02/07/2013  . Influenza-Unspecified 12/10/2013, 01/10/2015  . Pneumococcal Conjugate-13 06/09/2015  . Pneumococcal Polysaccharide-23 04/22/2011  . Tdap 04/11/2001, 05/07/2013    Preventative care: Last colonoscopy: 01/24/2013  Prior vaccinations: TD or Tdap: 2015  Influenza: 2016  Pneumococcal: 2017 Prevnar13: Due next year Shingles/Zostavax: Override  Names of Other Physician/Practitioners you currently use: 1. [facility] here for primary care 2. Dr. Earl Gala, eye doctor, last visit 2015 3. Has dentures , dentist Patient Care Team: Unk Pinto, MD as PCP - General (Internal Medicine) Sable Feil, MD as Consulting Physician (Gastroenterology) Clent Jacks, MD as Consulting Physician (Ophthalmology) Melina Schools, MD as Consulting Physician (Orthopedic Surgery) [place] [facility] Maryruth Eve, MD as Physician Assistant (Pain Medicine) Lennie Odor, MD as Referring Physician (Pain Medicine)  Past Surgical History  Procedure Laterality Date  . Back surgery  [id number]  . Morphine pump  2009    due to reaction to morphine/changed to fentanyl  . Excision of lipoma from right olecranon area  11/2000    Dr. Rise [name]  . Microdiscectomy and decompression  05/2002    Dr. Tonita Cong  . C3-4 anterior cervical discectomy and fusion with plating at c3-4  05/2006    Dr. Arnoldo Morale  . Spinal cord stimulator implanted      for pain per Dr. Maryruth Eve  . Subcut pain pump implanted    . Pain pump implantation      with fentanyl  . Tonsillectomy    . Colonoscopy w/ polypectomy    . Knee arthroscopy      right knee  . Total knee arthroplasty  02/29/2012    Procedure:  TOTAL KNEE ARTHROPLASTY;  Surgeon: Kerin Salen, MD;  Location: [facility];  Service: Orthopedics;  Laterality: Right;  . Lumbar laminectomy/decompression microdiscectomy N/A 01/08/2014    Procedure: L4-S1 Decompression with removal and reimplantation of spinal cord stimulator battery ;  Surgeon: Melina Schools, MD;  Location: [facility];  Service: Orthopedics;  Laterality: N/A;  . Knee arthroscopy Right 06/06/2014    Procedure: ARTHROSCOPY RIGHT KNEE WITH REMOVAL OF FIBROUS BANDS;  Surgeon: Kerin Salen, MD;  Location: [facility];  Service: Orthopedics;  Laterality: Right;   Family History  Problem Relation Age of Onset  . Cancer Father   . Colon cancer Neg Hx   . Esophageal cancer Neg Hx   . Stomach cancer Neg Hx   . Rectal cancer Neg Hx    Social History  Substance Use Topics  . Smoking status: Former Smoker -- 3.00 packs/day for 40 years  Types: Cigarettes    Quit date: 05/25/2002  . Smokeless tobacco: Never Used  . Alcohol Use: No    MEDICARE WELLNESS OBJECTIVES: Tobacco use: He does not smoke.  Patient is a former smoker. If yes, counseling given Alcohol Current alcohol use: none Osteoporosis: postmenopausal estrogen deficiency, History of fracture in the past year: no Fall risk: High Risk Hearing: normal Visual acuity: impaired,  does perform annual eye exam Diet: well balanced Physical activity: Current Exercise Habits:: Exercise is limited by, Limited by:: neurologic condition(s);orthopedic condition(s) Cardiac risk factors: Cardiac Risk Factors include: diabetes mellitus;advanced age (>79men, >67 women);dyslipidemia;hypertension;family history of premature cardiovascular disease;male gender;obesity (BMI >30kg/m2) Depression/mood screen:   Depression screen [facility] 2/9 06/09/2015  Decreased Interest 0  Down, Depressed, Hopeless 0  PHQ - 2 Score 0    ADLs:  In your present state of health, do you have any difficulty performing the following activities: 06/09/2015  03/10/2015  Hearing? N N  Vision? Y N  Difficulty concentrating or making decisions? N N  Walking or climbing stairs? Y Y  Dressing or bathing? N Y  Doing errands, shopping? N N  Preparing Food and eating ? N -  Using the Toilet? N -  In the past six months, have you accidently leaked urine? N -  Do you have problems with loss of bowel control? N -  Managing your Medications? N -  Managing your Finances? N -  Housekeeping or managing your Housekeeping? N -     Cognitive Testing  Alert? Yes  Normal Appearance?Yes  Oriented to person? Yes  Place? Yes   Time? Yes  Recall of three objects?  Yes  Can perform simple calculations? Yes  Displays appropriate judgment?Yes  Can read the correct time from a watch face?Yes  EOL planning: Does patient have an advance directive?: Yes Type of Advance Directive: Healthcare Power of Attorney, Living will Does patient want to make changes to advanced directive?: No - Patient declined Copy of advanced directive(s) in chart?: Yes   Objective:   Today's Vitals   06/09/15 1358  BP: 162/84  Pulse: 102  Temp: 98 F (36.7 C)  TempSrc: Temporal  Resp: 18  Height: 5' 7.75" (1.721 m)  Weight: 204 lb (92.534 kg)   Body mass index is 31.24 kg/(m^2).  General appearance: alert, no distress, WD/WN, male HEENT: normocephalic, sclerae anicteric, TMs pearly, nares patent, no discharge or erythema, pharynx normal Oral cavity: MMM, no lesions Neck: supple, no lymphadenopathy, no thyromegaly, no masses Heart: RRR, normal S1, S2, no murmurs Lungs: CTA bilaterally, no wheezes, rhonchi, or rales Abdomen: +bs, rotund soft, non tender, non distended, no masses, no hepatomegaly, no splenomegaly Musculoskeletal: nontender, no swelling, no obvious deformity Extremities: no edema, no cyanosis, no clubbing Pulses: 2+ symmetric, upper and lower extremities, normal cap refill Neurological: alert, oriented x 3, CN2-12 intact, strength normal upper extremities.   Mildly weakened in lower extremities, sensation normal throughout, DTRs 2+ throughout, no cerebellar signs, gait shuffling and unstable with use of cane Psychiatric: normal affect, behavior normal, pleasant   Medicare Attestation I have personally reviewed: The patient's medical and social history Their use of alcohol, tobacco or illicit drugs Their current medications and supplements The patient's functional ability including ADLs,fall risks, home safety risks, cognitive, and hearing and visual impairment Diet and physical activities Evidence for depression or mood disorders  The patient's weight, height, BMI, and visual acuity have been recorded in the chart.  I have made referrals, counseling, and provided education to the  patient based on review of the above and I have provided the patient with a written personalized care plan for preventive services.     Starlyn Skeans, PA-C   06/09/2015

## 2015-06-10 ENCOUNTER — Other Ambulatory Visit: Payer: Medicare Other

## 2015-06-10 DIAGNOSIS — E875 Hyperkalemia: Secondary | ICD-10-CM

## 2015-06-10 DIAGNOSIS — R899 Unspecified abnormal finding in specimens from other organs, systems and tissues: Secondary | ICD-10-CM

## 2015-06-10 LAB — BASIC METABOLIC PANEL WITH GFR
BUN: 9 mg/dL (ref 7–25)
CALCIUM: 9.1 mg/dL (ref 8.6–10.3)
CHLORIDE: 97 mmol/L — AB (ref 98–110)
CO2: 24 mmol/L (ref 20–31)
CREATININE: 0.68 mg/dL — AB (ref 0.70–1.25)
GFR, Est African American: >89 mL/min (ref >=60)
Glucose, Bld: 424 mg/dL — ABNORMAL HIGH (ref 65–99)
Potassium: 4.6 mmol/L (ref 3.5–5.3)
SODIUM: 135 mmol/L (ref 135–146)

## 2015-06-16 ENCOUNTER — Emergency Department (HOSPITAL_COMMUNITY)
Admission: EM | Admit: 2015-06-16 | Discharge: 2015-06-16 | Disposition: A | Discharge status: Home / Self Care | Payer: Medicare Other | Attending: Emergency Medicine | Admitting: Emergency Medicine

## 2015-06-16 ENCOUNTER — Encounter (HOSPITAL_COMMUNITY): Payer: Self-pay | Admitting: *Deleted

## 2015-06-16 ENCOUNTER — Emergency Department (HOSPITAL_COMMUNITY): Payer: Medicare Other

## 2015-06-16 DIAGNOSIS — E1165 Type 2 diabetes mellitus with hyperglycemia: Secondary | ICD-10-CM | POA: Insufficient documentation

## 2015-06-16 DIAGNOSIS — M1990 Unspecified osteoarthritis, unspecified site: Secondary | ICD-10-CM | POA: Diagnosis not present

## 2015-06-16 DIAGNOSIS — Z86018 Personal history of other benign neoplasm: Secondary | ICD-10-CM | POA: Diagnosis not present

## 2015-06-16 DIAGNOSIS — Z86010 Personal history of colonic polyps: Secondary | ICD-10-CM | POA: Diagnosis not present

## 2015-06-16 DIAGNOSIS — Z794 Long term (current) use of insulin: Secondary | ICD-10-CM | POA: Diagnosis not present

## 2015-06-16 DIAGNOSIS — S3992XA Unspecified injury of lower back, initial encounter: Secondary | ICD-10-CM | POA: Diagnosis not present

## 2015-06-16 DIAGNOSIS — E669 Obesity, unspecified: Secondary | ICD-10-CM | POA: Insufficient documentation

## 2015-06-16 DIAGNOSIS — Y998 Other external cause status: Secondary | ICD-10-CM | POA: Diagnosis not present

## 2015-06-16 DIAGNOSIS — Z87891 Personal history of nicotine dependence: Secondary | ICD-10-CM | POA: Diagnosis not present

## 2015-06-16 DIAGNOSIS — Z973 Presence of spectacles and contact lenses: Secondary | ICD-10-CM | POA: Diagnosis not present

## 2015-06-16 DIAGNOSIS — W01198A Fall on same level from slipping, tripping and stumbling with subsequent striking against other object, initial encounter: Secondary | ICD-10-CM | POA: Insufficient documentation

## 2015-06-16 DIAGNOSIS — F329 Major depressive disorder, single episode, unspecified: Secondary | ICD-10-CM | POA: Diagnosis not present

## 2015-06-16 DIAGNOSIS — S3991XA Unspecified injury of abdomen, initial encounter: Secondary | ICD-10-CM | POA: Insufficient documentation

## 2015-06-16 DIAGNOSIS — R739 Hyperglycemia, unspecified: Secondary | ICD-10-CM

## 2015-06-16 DIAGNOSIS — Z791 Long term (current) use of non-steroidal anti-inflammatories (NSAID): Secondary | ICD-10-CM | POA: Insufficient documentation

## 2015-06-16 DIAGNOSIS — E785 Hyperlipidemia, unspecified: Secondary | ICD-10-CM | POA: Diagnosis not present

## 2015-06-16 DIAGNOSIS — J449 Chronic obstructive pulmonary disease, unspecified: Secondary | ICD-10-CM | POA: Insufficient documentation

## 2015-06-16 DIAGNOSIS — Z8701 Personal history of pneumonia (recurrent): Secondary | ICD-10-CM | POA: Diagnosis not present

## 2015-06-16 DIAGNOSIS — D649 Anemia, unspecified: Secondary | ICD-10-CM | POA: Insufficient documentation

## 2015-06-16 DIAGNOSIS — Y9301 Activity, walking, marching and hiking: Secondary | ICD-10-CM | POA: Insufficient documentation

## 2015-06-16 DIAGNOSIS — Z79899 Other long term (current) drug therapy: Secondary | ICD-10-CM | POA: Insufficient documentation

## 2015-06-16 DIAGNOSIS — S3994XA Unspecified injury of external genitals, initial encounter: Secondary | ICD-10-CM | POA: Insufficient documentation

## 2015-06-16 DIAGNOSIS — Y9289 Other specified places as the place of occurrence of the external cause: Secondary | ICD-10-CM | POA: Insufficient documentation

## 2015-06-16 LAB — CBC WITH DIFFERENTIAL/PLATELET
BASOS ABS: 0 10*3/uL (ref 0.0–0.1)
Basophils Relative: 0 %
Eosinophils Absolute: 0.4 10*3/uL (ref 0.0–0.7)
Eosinophils Relative: 3 %
HEMATOCRIT: 39.4 % (ref 39.0–52.0)
Hemoglobin: 13.2 g/dL (ref 13.0–17.0)
LYMPHS PCT: 33 %
Lymphs Abs: 4.2 10*3/uL — ABNORMAL HIGH (ref 0.7–4.0)
MCH: 29.5 pg (ref 26.0–34.0)
MCHC: 33.5 g/dL (ref 30.0–36.0)
MCV: 88.1 fL (ref 78.0–100.0)
MONO ABS: 0.7 10*3/uL (ref 0.1–1.0)
MONOS PCT: 5 %
NEUTROS ABS: 7.5 10*3/uL (ref 1.7–7.7)
Neutrophils Relative %: 59 %
Platelets: 262 10*3/uL (ref 150–400)
RBC: 4.47 MIL/uL (ref 4.22–5.81)
RDW: 14.6 % (ref 11.5–15.5)
WBC: 12.7 10*3/uL — ABNORMAL HIGH (ref 4.0–10.5)

## 2015-06-16 LAB — I-STAT CHEM 8, ED
BUN: 8 mg/dL (ref 6–20)
CREATININE: 0.5 mg/dL — AB (ref 0.61–1.24)
Calcium, Ion: 1.13 mmol/L (ref 1.13–1.30)
Chloride: 97 mmol/L — ABNORMAL LOW (ref 101–111)
Glucose, Bld: 319 mg/dL — ABNORMAL HIGH (ref 65–99)
HCT: 43 % (ref 39.0–52.0)
HEMOGLOBIN: 14.6 g/dL (ref 13.0–17.0)
POTASSIUM: 3.9 mmol/L (ref 3.5–5.1)
Sodium: 137 mmol/L (ref 135–145)
TCO2: 26 mmol/L (ref 0–100)

## 2015-06-16 LAB — URINALYSIS, ROUTINE W REFLEX MICROSCOPIC
BILIRUBIN URINE: NEGATIVE
Glucose, UA: >1000 mg/dL — AB
HGB URINE DIPSTICK: NEGATIVE
KETONES UR: NEGATIVE mg/dL
Leukocytes, UA: NEGATIVE
NITRITE: NEGATIVE
Protein, ur: NEGATIVE mg/dL
SPECIFIC GRAVITY, URINE: 1.046 — AB (ref 1.005–1.030)
pH: 5 (ref 5.0–8.0)

## 2015-06-16 LAB — COMPREHENSIVE METABOLIC PANEL
ALBUMIN: 4.2 g/dL (ref 3.5–5.0)
ALT: 14 U/L — AB (ref 17–63)
AST: 18 U/L (ref 15–41)
Alkaline Phosphatase: 82 U/L (ref 38–126)
Anion gap: 9 (ref 5–15)
BUN: 9 mg/dL (ref 6–20)
CHLORIDE: 100 mmol/L — AB (ref 101–111)
CO2: 25 mmol/L (ref 22–32)
CREATININE: 0.58 mg/dL — AB (ref 0.61–1.24)
Calcium: 9.1 mg/dL (ref 8.9–10.3)
GFR calc Af Amer: >60 mL/min (ref >60)
GLUCOSE: 320 mg/dL — AB (ref 65–99)
POTASSIUM: 4 mmol/L (ref 3.5–5.1)
Sodium: 134 mmol/L — ABNORMAL LOW (ref 135–145)
Total Bilirubin: 0.8 mg/dL (ref 0.3–1.2)
Total Protein: 7.3 g/dL (ref 6.5–8.1)

## 2015-06-16 LAB — I-STAT CG4 LACTIC ACID, ED
LACTIC ACID, VENOUS: 2.33 mmol/L — AB (ref 0.5–2.0)
LACTIC ACID, VENOUS: 1.59 mmol/L (ref 0.5–2.0)

## 2015-06-16 LAB — TYPE AND SCREEN
ABO/RH(D): O POS
ANTIBODY SCREEN: NEGATIVE

## 2015-06-16 LAB — I-STAT TROPONIN, ED: TROPONIN I, POC: 0 ng/mL (ref 0.00–0.08)

## 2015-06-16 LAB — URINE MICROSCOPIC-ADD ON
RBC / HPF: NONE SEEN RBC/hpf (ref 0–5)
SQUAMOUS EPITHELIAL / LPF: NONE SEEN
WBC UA: NONE SEEN WBC/hpf (ref 0–5)

## 2015-06-16 MED ORDER — DIAZEPAM 5 MG PO TABS: 5.0000 mg | ORAL_TABLET | Freq: Once | ORAL | Status: DC

## 2015-06-16 MED ORDER — KETOROLAC TROMETHAMINE 15 MG/ML IJ SOLN
15.0000 mg | Freq: Once | INTRAMUSCULAR | Status: AC
  Administered 2015-06-16: 15 mg via INTRAVENOUS
  Filled 2015-06-16: qty 1

## 2015-06-16 MED ORDER — IOHEXOL 300 MG/ML  SOLN
100.0000 mL | Freq: Once | INTRAMUSCULAR | Status: AC | PRN
  Administered 2015-06-16: 100 mL via INTRAVENOUS

## 2015-06-16 MED ORDER — HYDROMORPHONE HCL 1 MG/ML IJ SOLN
1.0000 mg | INTRAMUSCULAR | Status: DC | PRN
  Administered 2015-06-16: 1 mg via INTRAVENOUS
  Filled 2015-06-16: qty 1

## 2015-06-16 MED ORDER — DIAZEPAM 5 MG/ML IJ SOLN
2.5000 mg | Freq: Once | INTRAMUSCULAR | Status: DC
  Filled 2015-06-16: qty 2

## 2015-06-16 MED ORDER — HYDROMORPHONE HCL 1 MG/ML IJ SOLN
0.5000 mg | Freq: Once | INTRAMUSCULAR | Status: AC
Start: 2015-06-16 — End: 2015-06-16
  Administered 2015-06-16: 0.5 mg via INTRAVENOUS
  Filled 2015-06-16: qty 1

## 2015-06-16 MED ORDER — LORAZEPAM 2 MG/ML IJ SOLN
1.0000 mg | Freq: Once | INTRAMUSCULAR | Status: AC
  Administered 2015-06-16: 1 mg via INTRAVENOUS
  Filled 2015-06-16: qty 1

## 2015-06-16 MED ORDER — DIAZEPAM 5 MG PO TABS
5.0000 mg | ORAL_TABLET | Freq: Once | ORAL | Status: AC
  Administered 2015-06-16: 5 mg via ORAL
  Filled 2015-06-16: qty 1

## 2015-06-16 MED ORDER — SODIUM CHLORIDE 0.9 % IV BOLUS (SEPSIS)
1000.0000 mL | Freq: Once | INTRAVENOUS | Status: AC
  Administered 2015-06-16: 1000 mL via INTRAVENOUS

## 2015-06-16 NOTE — ED Notes (Signed)
Per [facility], pt stumbled falling on lawn furniture.  C/o lower abd pain, no bruising noted.  Deneis chest pain/n/v.  Received 250 mcg fentanyl.  NSR with occassional PVC, LBBB.

## 2015-06-16 NOTE — ED Notes (Signed)
Bed: [id number] Expected date:  Expected time:  Means of arrival:  Comments: EMS- abdominal injury after fall, IV meds given

## 2015-06-16 NOTE — ED Provider Notes (Signed)
FAST Exam: Limited Ultrasound of the abdomen and pericardium (FAST Exam).  Multiple views of the abdomen and pericardium are obtained with a multi-frequency probe.  EMERGENCY DEPARTMENT [place] FAST EXAM  INDICATIONS:Tachycardia and Blunt injury of abdomen  PERFORMED BY: Myself  IMAGES ARCHIVED?: Yes  FINDINGS: All views negative and Pericardial effusion absent  LIMITATIONS:  Body habitus  INTERPRETATION:  No abdominal free fluid and Indeterminate abdominal study. No pericardial effusion  CPT Codes: cardiac [id number], abdomen [phone] (study includes both codes)   I performed FAST exam only. Management by primary team as documented elsewhere.   Merrily Pew, MD 06/16/15 [phone]

## 2015-06-16 NOTE — Progress Notes (Signed)
CSW met with patient at bedside. Son was present. Nurse was present. Son stated patient has just been medication and may not be able to grasp conversation.  Son confirms that patient presents to [facility] due to fall. Son states that patient lost his footing and fell on top of a piece of metal type furniture. Patient informed CSW that he falls often. Son states that patient uses a cane or a walker to ambulate. Son states that patient completes ADL's independently and was scheduled to start an aquatic physical therapy class today.  Son states that patient lives in [place] with his wife. Son states that if rehab is determined to be needed he would prefer home health.  Willette Brace [phone] ED CSW 06/16/2015 9:32 PM

## 2015-06-16 NOTE — ED Notes (Signed)
Pt has pain pump implant to RLQ.

## 2015-06-16 NOTE — Discharge Instructions (Signed)
Continue pain management at home. Follow up with primary care doctor tomorrow or day after for recheck. Return if worsening.  Blunt Abdominal Trauma Blunt abdominal trauma is a type of injury that involves damage to the abdominal wall or to abdominal organs, such as the liver or spleen. The damage can involve bruising, tearing, or a rupture. This type of injury does not involve a puncture of the skin. Blunt abdominal trauma can range from mild to severe. In some cases it can lead to a severe abdominal inflammation (peritonitis), severe bleeding, and a dangerous drop in blood pressure. CAUSES This injury is caused by a hard, direct hit to the abdomen. It can happen after:  A motor vehicle accident.  Being kicked or punched in the abdomen.  Falling from a significant height. RISK FACTORS This injury is more likely to happen in people who:  Play contact sports.  Work in a job in which falls or injuries are more likely, such as in Architect. SYMPTOMS The main symptom of this condition is pain in the abdomen. Other symptoms depend on the type and location of the injury. They can include:  Abdominal pain that spreads to the the back or shoulder.  Bruising.  Swelling.  Pain when pressing on the abdomen.  Blood in the urine.  Weakness.  Confusion.  Loss of consciousness.  Pale, dusky, cool, or sweaty skin.  Vomiting blood.  Bloody stool or bleeding from the rectum.  Trouble breathing. Symptoms of this injury can develop suddenly or slowly.  DIAGNOSIS This injury is diagnosed based on your symptoms and a physical exam. You may also have tests, including:  Blood tests.  Urine tests.  Imaging tests, such as:  A CT scan and ultrasound of your abdomen.  X-rays of your chest and abdomen.  A test in which a tube is used to flush your abdomen with fluid and check for blood (diagnostic peritoneal lavage). TREATMENT Treatment for this injury depends on its type and  severity. Treatment options include:  Observation. If the injury is mild, this may be the only treatment needed.  Support of your blood pressure and breathing.  Getting blood, fluids, or medicine through an IV tube.  Antibiotic medicine.  Insertion of tubes into the stomach or bladder.  A blood transfusion.  A procedure to stop bleeding. This involves putting a long, thin tube (catheter) into one of your blood vessels (angiographic embolization).  Surgery to open up your abdomen and control bleeding or repair damage (laparotomy). This may be done if tests suggest that you have peritonitis or bleeding that cannot be controlled with angiographic embolization. HOME CARE INSTRUCTIONS  Take medicines only as directed by your health care provider.  If you were prescribed an antibiotic medicine, finish all of it even if you start to feel better.  Follow your health care provider's instructions about diet and activity restrictions.  Keep all follow-up visits as directed by your health care provider. This is important. SEEK MEDICAL CARE IF:  You continue to have abdominal pain.  Your symptoms return.  You develop new symptoms.  You have blood in your urine or your bowel movements. SEEK IMMEDIATE MEDICAL CARE IF:  You vomit blood.  You have heavy bleeding from your rectum.  You have very bad abdominal pain.  You have trouble breathing.  You have chest pain.  You have a fever.  You have dizziness.  You pass out.   This information is not intended to replace advice given to you by  your health care provider. Make sure you discuss any questions you have with your health care provider.   Document Released: 05/05/2004 Document Revised: 08/12/2014 Document Reviewed: 03/19/2014 Elsevier Interactive Patient Education [employer].

## 2015-06-16 NOTE — ED Notes (Signed)
Patient transported to CT 

## 2015-06-16 NOTE — ED Provider Notes (Signed)
Medical screening examination/treatment/procedure(s) were conducted as a shared visit with non-physician practitioner(s) and myself.  I personally evaluated the patient during the encounter.   EKG Interpretation   Date/Time:  Tuesday June 16 2015 16:21:17 EST Ventricular Rate:  124 PR Interval:  102 QRS Duration: 149 QT Interval:  373 QTC Calculation: 536 R Axis:   9 Text Interpretation:  Sinus tachycardia Left bundle branch block similar  to 03/10/15, but now tachycardia different from April 12, 2014 Confirmed  by [facility] MD, Corene Cornea [phone]) on 06/16/2015 4:26:36 PM     Patient here after falling and striking his abdomen. Abdominal CT without evidence of intra-abdominal bleeding.no evidence of surgical abdomen at this time. Patient's pain treated with medications. We'll continue to monitor  [name], MD 06/16/15 2038

## 2015-06-16 NOTE — ED Provider Notes (Signed)
CSN: [id number]     Arrival date & time 06/16/15  1606 History   First MD Initiated Contact with Patient 06/16/15 1614     Chief Complaint  Patient presents with  . Fall  . Abdominal Pain     (Consider location/radiation/quality/duration/timing/severity/associated sxs/prior Treatment) HPI James Parsons is a 70 y.o. male with multiple medical problems, presents to emergency department after a fall. Patient states he was walking from a car and reports stumbling over a step and landing onto a metal chair. He reports hitting abdomen on the part of the chair. He is not exactly sure how the fall happened. He denies hitting his head or loss of consciousness. He is stating that he has severe abdominal pain, rating it an unattended. He received 250 g of fentanyl by EMS and states he has not touched his pain. He denies any pain radiating down his legs. He reports some pain to his back. States pain radiates to the scrotum. Denies any tenderness to the scrotum. No blood to the penis.  Past Medical History  Diagnosis Date  . Anemia   . Arthritis   . Blood transfusion   . Clotting disorder (HCC)     bleeds easily-no dx  . Diabetes mellitus   . Borderline hypertension   . Venous insufficiency   . Hyperlipidemia   . Obesity   . Diverticulosis of colon   . Hx of colonic polyps   . Low back pain   . Lipoma   . RSD (reflex sympathetic dystrophy)   . Mental disorder   . Depression     hx of   . COPD (chronic obstructive pulmonary disease) ([place])     denies  . Pneumonia     hx  . Full dentures    Past Surgical History  Procedure Laterality Date  . Back surgery  [id number]  . Morphine pump  2009    due to reaction to morphine/changed to fentanyl  . Excision of lipoma from right olecranon area  11/2000    Dr. Rise [name]  . Microdiscectomy and decompression  05/2002    Dr. Tonita Cong  . C3-4 anterior cervical discectomy and fusion with plating at c3-4  05/2006    Dr. Arnoldo Morale  . Spinal cord  stimulator implanted      for pain per Dr. Maryruth Eve  . Subcut pain pump implanted    . Pain pump implantation      with fentanyl  . Tonsillectomy    . Colonoscopy w/ polypectomy    . Knee arthroscopy      right knee  . Total knee arthroplasty  02/29/2012    Procedure: TOTAL KNEE ARTHROPLASTY;  Surgeon: Kerin Salen, MD;  Location: [facility];  Service: Orthopedics;  Laterality: Right;  . Lumbar laminectomy/decompression microdiscectomy N/A 01/08/2014    Procedure: L4-S1 Decompression with removal and reimplantation of spinal cord stimulator battery ;  Surgeon: Melina Schools, MD;  Location: [place];  Service: Orthopedics;  Laterality: N/A;  . Knee arthroscopy Right 06/06/2014    Procedure: ARTHROSCOPY RIGHT KNEE WITH REMOVAL OF FIBROUS BANDS;  Surgeon: Kerin Salen, MD;  Location: [place];  Service: Orthopedics;  Laterality: Right;   Family History  Problem Relation Age of Onset  . Cancer Father   . Colon cancer Neg Hx   . Esophageal cancer Neg Hx   . Stomach cancer Neg Hx   . Rectal cancer Neg Hx    Social History  Substance Use Topics  . Smoking  status: Former Smoker -- 3.00 packs/day for 40 years    Types: Cigarettes    Quit date: 05/25/2002  . Smokeless tobacco: Never Used  . Alcohol Use: No    Review of Systems  Constitutional: Negative for fever and chills.  Respiratory: Negative for cough, chest tightness and shortness of breath.   Cardiovascular: Negative for chest pain, palpitations and leg swelling.  Gastrointestinal: Positive for abdominal pain. Negative for nausea, vomiting, diarrhea and abdominal distention.  Genitourinary: Positive for testicular pain. Negative for dysuria, urgency, frequency, hematuria, discharge, penile swelling, scrotal swelling and penile pain.  Musculoskeletal: Positive for back pain and arthralgias. Negative for myalgias, neck pain and neck stiffness.  Skin: Negative for rash.  Allergic/Immunologic: Negative for immunocompromised  state.  Neurological: Negative for dizziness, weakness, light-headedness, numbness and headaches.  All other systems reviewed and are negative.     Allergies  Codeine and Morphine and related  Home Medications   Prior to Admission medications   Medication Sig Start Date End Date Taking? Authorizing Provider  celecoxib (CELEBREX) 200 MG capsule Take 200 mg by mouth 2 (two) times daily. 01/20/14   Historical Provider, MD  Cholecalciferol (VITAMIN D-3) 5000 UNITS TABS Take 5,000 Units by mouth 2 (two) times daily.    Historical Provider, MD  cloNIDine (CATAPRES) 0.1 MG tablet Take 0.1 mg by mouth 2 (two) times daily.    Historical Provider, MD  cyclobenzaprine (FLEXERIL) 10 MG tablet Take 10 mg by mouth 3 (three) times daily as needed for muscle spasms. Muscle spasm. 03/02/11   Historical Provider, MD  docusate sodium 100 MG CAPS Take 100 mg by mouth 2 (two) times daily. Patient taking differently: Take 100 mg by mouth 2 (two) times daily. prn 04/09/14   Jonetta Osgood, MD  DULoxetine (CYMBALTA) 60 MG capsule Take 60 mg by mouth daily.  10/09/12   Historical Provider, MD  empagliflozin (JARDIANCE) 10 MG TABS tablet Take 10 mg by mouth daily. 09/01/14   Courtney Forcucci, PA-C  fentaNYL 0.05 MG/ML SOLN 5,000 mcg Inject into the skin continuous. Patient will bring dosage instructions with him. Daily dose 0.84mg     Historical Provider, MD  folic acid (FOLVITE) 1 MG tablet Take 1 mg by mouth daily. 08/13/14   Historical Provider, MD  glimepiride (AMARYL) 4 MG tablet Take 1 tablet (4 mg total) by mouth daily with breakfast. 04/15/14   Unk Pinto, MD  lidocaine (LIDODERM) 5 % Place 1 patch onto the skin daily. 04/09/14   Shanker Kristeen Mans, MD  LYRICA 150 MG capsule Take 150 mg by mouth 2 (two) times daily.  02/21/11   Historical Provider, MD  metFORMIN (GLUCOPHAGE) 500 MG tablet TAKE TWO TABLETS BY MOUTH TWICE DAILY 01/20/15   Unk Pinto, MD  methotrexate [facility]) 2.5 MG tablet  08/13/14    Historical Provider, MD  oxyCODONE-acetaminophen (PERCOCET) 10-325 MG per tablet Take 1 tablet by mouth every 4 (four) hours as needed for pain.    Historical Provider, MD  simvastatin (ZOCOR) 40 MG tablet Take 40 mg by mouth daily.    Historical Provider, MD  tamsulosin (FLOMAX) 0.4 MG CAPS capsule Take 0.4 mg by mouth daily after supper.  05/07/13   Historical Provider, MD  traMADol (ULTRAM) 50 MG tablet Take 1 tablet (50 mg total) by mouth every 6 (six) hours as needed for moderate pain. pain 04/09/14   Jonetta Osgood, MD   BP 154/102 mmHg  Pulse 127  Temp(Src) 97.9 F (36.6 C) (Oral)  Resp 22  SpO2 95% Physical Exam  Constitutional: He is oriented to person, place, and time. He appears well-developed and well-nourished. No distress.  HENT:  Head: Normocephalic and atraumatic.  Eyes: Conjunctivae are normal.  Neck: Neck supple.  Cardiovascular: Normal rate, regular rhythm and normal heart sounds.   Pulmonary/Chest: Effort normal. No respiratory distress. He has no wheezes. He has no rales.  Abdominal: Soft. Bowel sounds are normal. He exhibits no distension. There is tenderness. There is no rebound.  Lower abdominal tenderness. Mild erythema across the lower abdomen. No significant hematoma or bruising. No tenderness to the upper abdomen. No guarding on exam.   Genitourinary:  Normal scrotum and penis. No tenderness to palpation over scrotum or penis  Musculoskeletal: He exhibits no edema.  Midline lumbar spine tenderness. No tenderness with pressure to the pelvis. Full range motion bilateral hips.  Neurological: He is alert and oriented to person, place, and time.  5/5 and equal lower extremity strength. 2+ and equal patellar reflexes bilaterally. Pt able to dorsiflex bilateral toes and feet with good strength against resistance. Equal sensation bilaterally over thighs and lower legs.   Skin: Skin is warm and dry.  Nursing note and vitals reviewed.   ED Course  Procedures  (including critical care time) Labs Review Labs Reviewed  CBC WITH DIFFERENTIAL/PLATELET - Abnormal; Notable for the following:    WBC 12.7 (*)    Lymphs Abs 4.2 (*)    All other components within normal limits  COMPREHENSIVE METABOLIC PANEL - Abnormal; Notable for the following:    Sodium 134 (*)    Chloride 100 (*)    Glucose, Bld 320 (*)    Creatinine, Ser 0.58 (*)    ALT 14 (*)    All other components within normal limits  URINALYSIS, ROUTINE W REFLEX MICROSCOPIC (NOT AT [facility]) - Abnormal; Notable for the following:    Specific Gravity, Urine 1.046 (*)    Glucose, UA >1000 (*)    All other components within normal limits  URINE MICROSCOPIC-ADD ON - Abnormal; Notable for the following:    Bacteria, UA RARE (*)    All other components within normal limits  I-STAT CHEM 8, ED - Abnormal; Notable for the following:    Chloride 97 (*)    Creatinine, Ser 0.50 (*)    Glucose, Bld 319 (*)    All other components within normal limits  I-STAT CG4 LACTIC ACID, ED - Abnormal; Notable for the following:    Lactic Acid, Venous 2.33 (*)    All other components within normal limits  I-STAT TROPOININ, ED  I-STAT CG4 LACTIC ACID, ED  TYPE AND SCREEN  ABO/RH    Imaging Review No results found. I have personally reviewed and evaluated these images and lab results as part of my medical decision-making.   EKG Interpretation   Date/Time:  Tuesday June 16 2015 16:21:17 EST Ventricular Rate:  124 PR Interval:  102 QRS Duration: 149 QT Interval:  373 QTC Calculation: 536 R Axis:   9 Text Interpretation:  Sinus tachycardia Left bundle branch block similar  to 03/10/15, but now tachycardia different from April 12, 2014 Confirmed  by [facility] MD, Corene Cornea [phone]) on 06/16/2015 4:26:36 PM      MDM   Final diagnoses:  Abdominal injury, initial encounter  Hyperglycemia   Patient emergency department after falling and hitting his abdomen on the chair. He is complaining of severe pain to the  lower abdomen. He appears to be very uncomfortable, crying and rolling around in  bed. Small erythema to the lower abdomen, no other findings. Patient is tachycardic. EKG showing sinus tachycardia with left bundle branch block, similar to that of 03/10/15. Will check labs including troponin, will get CT abdomen and pelvis for evaluation of internal injuries. He received 263mcg of fentanyl by EMS.   FAST exam by Dr. Pollyann Savoy. No significant abnormality seen. PT receiving dilaudid for pain.     7:52 PM Pt continues to have pain, although it is much improved. Will try toradol and valium.   9:30 PM Patient is feeling much better. I ambulated him around the room. He was able to urinate with no difficulty. He states pain is much better and he wants to be discharged home. Offered overnight observation, however patient insisted on going home. Patient will be discharged home with his son. He will return if pain is getting worse or if he develops any new abnormal symptoms. Otherwise follow with primary care doctor.  Filed Vitals:   06/16/15 2132 06/16/15 2134 06/16/15 2137 06/16/15 2200  BP: 148/81  157/93 143/97  Pulse:  109 106 107  Temp:   97.9 F (36.6 C)   TempSrc:   Oral   Resp:  10 18 13   SpO2:  97% 97% 98%     Jeannett Senior, PA-C 06/16/15 2320  [name], MD 06/16/15 2350

## 2015-06-17 LAB — ABO/RH: ABO/RH(D): O POS

## 2015-06-22 ENCOUNTER — Inpatient Hospital Stay: Admit: 2015-06-23

## 2015-06-22 DIAGNOSIS — R6521 Severe sepsis with septic shock: Secondary | ICD-10-CM

## 2015-06-22 DIAGNOSIS — A419 Sepsis, unspecified organism: Secondary | ICD-10-CM

## 2015-06-22 LAB — RFLX FOLLOW UP AT 2 HRS: 2 RFLX FOLLOW UP AT 2 HRS: 4.3 mmol/L — AB (ref 0.4–2.0)

## 2015-06-22 LAB — RFLX FOLLOW UP AT 4 HRS: 4 Rflx Follow Up at 4 Hrs: 4.4 mmol/L — AB (ref 0.4–2.0)

## 2015-06-22 LAB — CK: CPK: 39 U/L (ref 39–308)

## 2015-06-22 LAB — TROPONIN I: Troponin I: <0.015 ng/ml (ref <0.045)

## 2015-06-22 LAB — CK-MB INDEX: CK-MB: 0.6 ng/ml (ref 0.5–3.6)

## 2015-06-22 NOTE — Procedures (Signed)
Arthur Hunt is a 70 y.o. male patient.  No diagnosis found.  No past medical history on file.  Blood pressure (!) 84/52, pulse 105, temperature 100.7 ??F (38.2 ??C), temperature source Axillary, resp. rate 22, height 6' (1.829 m), weight 191 lb 8 oz (86.9 kg), SpO2 96 %.    Central Line  Date/Time: 06/22/2015 11:47 PM  Performed by: Gae GallopZIMMERLY, Lorimer Tiberio A.  Authorized by: Gae GallopZIMMERLY, Cadie Sorci A.   Consent: Written consent obtained.  Risks and benefits: risks, benefits and alternatives were discussed  Consent given by: patient  Patient identity confirmed: arm band  Indications: vascular access  Anesthesia: local infiltration    Anesthesia:  Anesthesia: local infiltration  Local Anesthetic: lidocaine 1% with epinephrine   Sedation:  Patient sedated: no    Preparation: skin prepped with ChloraPrep  Skin prep agent dried: skin prep agent completely dried prior to procedure  Sterile barriers: all five maximum sterile barriers used - cap, mask, sterile gown, sterile gloves, and large sterile sheet  Hand hygiene: hand hygiene performed prior to central venous catheter insertion  Location details: right internal jugular  Patient position: Trendelenburg  Catheter type: triple lumen  Catheter size: 7.5 Fr  Pre-procedure: landmarks identified  Ultrasound guidance: yes  Sterile ultrasound techniques: sterile gel and sterile probe covers were used  Number of attempts: 1  Successful placement: yes  Post-procedure: line sutured and dressing applied  Assessment: blood return through all ports,  placement verified by x-ray and no pneumothorax on x-ray  Patient tolerance: Patient tolerated the procedure well with no immediate complications          Arthur Oxendine A Kateria Cutrona, DO  06/22/2015

## 2015-06-22 NOTE — H&P (Signed)
GENERAL SURGERY  H&P  06/22/2015      HPI  Arthur Hunt is a 70 y.o. male who presents for evaluation of septic shock secondary to liver abscess and possible right sided ischemic colitis. Patient was transferred to [facility] SICU from [facility] [place] after found hypotensive though asymptomatic at home by a home health nurse. Patient has a significant history of pancreatic cancer with attempted [place] 12/2014 by Dr. Lowella Bandy which was aborted due to vascular invasion with subsequent palliative hepaticoduodenostomy. Patient denies any abdominal pain, nausea, vomiting, fevers. Does admit to some chills and fatigue. He has undergone 5 chemotherapy treatments thus far.     After presenting to [facility], he had a CT done which showed a hepatic abscess of the right lobe with concern for portal vs portal venous air and right sided colonic thickening with possible intramural air, concerning for ischemic colitis. He was transferred here for further monitoring and management.       No past medical history on file.    No past surgical history on file.    Medications Prior to Admission:    Prior to Admission medications    Not on File       Allergies   Allergen Reactions   ??? Adhesive Tape        No family history on file.    Social History   Substance Use Topics   ??? Smoking status: Not on file   ??? Smokeless tobacco: Not on file   ??? Alcohol use Not on file         Review of Systems   General ROS: positive for  - chills and fatigue  Hematological and Lymphatic ROS: negative  Respiratory ROS: positive for - shortness of breath  Cardiovascular ROS: negative  Gastrointestinal ROS: positive for RUQ abdominal fullness  Genito-Urinary ROS: no dysuria, trouble voiding, or hematuria  Musculoskeletal ROS: negative      PHYSICAL EXAM:    Vitals:    06/22/15 2101   BP: (!) 94/52   Pulse: 117   Resp: (!) 35   Temp: 100.7 ??F (38.2 ??C)   SpO2: 97%       General Appearance:  awake, alert, oriented, in no acute distress  Skin:  Diffusely  jaundiced  Head/face:  NCAT  Eyes:  Sclera-  icteric   Lungs:  Normal expansion.  Clear to auscultation.  No rales, rhonchi, or wheezing.  Heart:  Heart regular rate and rhythm  Abdomen:  Soft, RUQ abdominal fullness with mild tenderness, nondistended, well healed midline laparotomy scar.   Extremities: Extremities warm to touch, pink, with no edema.      LABS:    CBC  Recent Labs      06/22/15   2140   WBC  26.0*   HGB  8.0*   HCT  24.4*   PLT  117*     BMP  No results for input(s): NA, K, CL, CO2, BUN, CREATININE, CALCIUM in the last 72 hours.    Invalid input(s): GLU  Liver Function  No results for input(s): AMYLASE, LIPASE, BILITOT, BILIDIR, AST, ALT, ALKPHOS, PROT, LABALBU in the last 72 hours.  No results for input(s): LACTATE in the last 72 hours.  Recent Labs      06/22/15   2140   INR  4.4       RADIOLOGY    Xr Chest Portable    Result Date: 06/22/2015  Patient MRN:  [id number] DOB: 1945-12-23 Age: 64 years Gender: Male  Order Date:  06/22/2015 9:28 PM EXAM: XR CHEST PORTABLE NUMBER OF VIEWS:  1 INDICATION:  pre op  COMPARISON: None FINDINGS: The heart is normal in size. The mediastinum is normal in width. There is a normal appearance to the pulmonary vasculature. No focal airspace opacity. There is no pleural effusion. There is no pneumothorax. Left-sided Mediport device noted.     No airspace opacities or pleural effusion.         ASSESSMENT:  70 y.o. male with hx pancreatic cancer and septic shock, with right hepatic abscess and right sided colitis, possibly ischemic    PLAN:  Aggressive IVF resuscitation  Serial labs and abdominal exams  LA q6  Pan cultures  Broad spectrum antibiotics  Pain control prn  Monitor for peritoneal signs  Palliative care consult  Will monitor closely, may require surgical intervention if sepsis worsens    CT scans were reviewed with radiology. Patient was discussed with chief resident and attending on call.    Electronically signed by Arthur CriglerMegan A Ettel Albergo, DO on 06/22/15 at 10:18  PM

## 2015-06-23 ENCOUNTER — Inpatient Hospital Stay: Admit: 2015-06-23

## 2015-06-23 ENCOUNTER — Encounter

## 2015-06-23 ENCOUNTER — Encounter: Admit: 2015-06-24

## 2015-06-23 ENCOUNTER — Inpatient Hospital Stay
Admit: 2015-06-23 | Discharge: 2015-06-29 | Disposition: A | Discharge status: Home / Self Care | Payer: MEDICARE | Source: Other Acute Inpatient Hospital | Attending: Surgery | Admitting: Surgery

## 2015-06-23 ENCOUNTER — Encounter: Admit: 2015-06-23

## 2015-06-23 LAB — COMPREHENSIVE METABOLIC PANEL
ALT: 26 U/L (ref 0–40)
ALT: 23 U/L (ref 0–40)
ALT: 30 U/L (ref 0–40)
ALT: 30 U/L (ref 0–40)
AST: 56 U/L — ABNORMAL HIGH (ref 0–39)
AST: 53 U/L — ABNORMAL HIGH (ref 0–39)
AST: 62 U/L — ABNORMAL HIGH (ref 0–39)
AST: 68 U/L — ABNORMAL HIGH (ref 0–39)
Albumin: 1.6 g/dL — ABNORMAL LOW (ref 3.5–5.2)
Albumin: 1.5 g/dL — ABNORMAL LOW (ref 3.5–5.2)
Albumin: 1.8 g/dL — ABNORMAL LOW (ref 3.5–5.2)
Albumin: 1.9 g/dL — ABNORMAL LOW (ref 3.5–5.2)
Alkaline Phosphatase: 462 U/L — ABNORMAL HIGH (ref 40–129)
Alkaline Phosphatase: 348 U/L — ABNORMAL HIGH (ref 40–129)
Alkaline Phosphatase: 449 U/L — ABNORMAL HIGH (ref 40–129)
Alkaline Phosphatase: 492 U/L — ABNORMAL HIGH (ref 40–129)
Anion Gap: 21 mmol/L — ABNORMAL HIGH (ref 7–16)
Anion Gap: 11 mmol/L (ref 7–16)
Anion Gap: 14 mmol/L (ref 7–16)
Anion Gap: 13 mmol/L (ref 7–16)
BUN: 13 mg/dL (ref 8–23)
BUN: 13 mg/dL (ref 8–23)
BUN: 14 mg/dL (ref 8–23)
BUN: 15 mg/dL (ref 8–23)
CO2: 16 mmol/L — ABNORMAL LOW (ref 22–29)
CO2: 18 mmol/L — ABNORMAL LOW (ref 22–29)
CO2: 18 mmol/L — ABNORMAL LOW (ref 22–29)
CO2: 18 mmol/L — ABNORMAL LOW (ref 22–29)
Calcium: 7.6 mg/dL — ABNORMAL LOW (ref 8.6–10.2)
Calcium: 7.4 mg/dL — ABNORMAL LOW (ref 8.6–10.2)
Calcium: 7.9 mg/dL — ABNORMAL LOW (ref 8.6–10.2)
Calcium: 7.9 mg/dL — ABNORMAL LOW (ref 8.6–10.2)
Chloride: 100 mmol/L (ref 98–107)
Chloride: 105 mmol/L (ref 98–107)
Chloride: 106 mmol/L (ref 98–107)
Chloride: 106 mmol/L (ref 98–107)
Creatinine: 1 mg/dL (ref 0.7–1.2)
Creatinine: 0.8 mg/dL (ref 0.7–1.2)
Creatinine: 0.8 mg/dL (ref 0.7–1.2)
Creatinine: 0.8 mg/dL (ref 0.7–1.2)
GFR African American: >60
GFR African American: >60
GFR African American: >60
GFR African American: >60
GFR Non-African American: >60 mL/min/{1.73_m2} (ref >=60)
GFR Non-African American: >60 mL/min/{1.73_m2} (ref >=60)
GFR Non-African American: >60 mL/min/{1.73_m2} (ref >=60)
GFR Non-African American: >60 mL/min/{1.73_m2} (ref >=60)
Glucose: 137 mg/dL — ABNORMAL HIGH (ref 74–109)
Glucose: 122 mg/dL — ABNORMAL HIGH (ref 74–109)
Glucose: 116 mg/dL — ABNORMAL HIGH (ref 74–109)
Glucose: 121 mg/dL — ABNORMAL HIGH (ref 74–109)
Potassium: 4.1 mmol/L (ref 3.5–5.0)
Potassium: 4.1 mmol/L (ref 3.5–5.0)
Potassium: 4.7 mmol/L (ref 3.5–5.0)
Potassium: 4.1 mmol/L (ref 3.5–5.0)
Sodium: 137 mmol/L (ref 132–146)
Sodium: 134 mmol/L (ref 132–146)
Sodium: 138 mmol/L (ref 132–146)
Sodium: 137 mmol/L (ref 132–146)
Total Bilirubin: 4.3 mg/dL — ABNORMAL HIGH (ref 0.0–1.2)
Total Bilirubin: 3.5 mg/dL — ABNORMAL HIGH (ref 0.0–1.2)
Total Bilirubin: 4 mg/dL — ABNORMAL HIGH (ref 0.0–1.2)
Total Bilirubin: 4.4 mg/dL — ABNORMAL HIGH (ref 0.0–1.2)
Total Protein: 5.4 g/dL — ABNORMAL LOW (ref 6.4–8.3)
Total Protein: 4.7 g/dL — ABNORMAL LOW (ref 6.4–8.3)
Total Protein: 5.4 g/dL — ABNORMAL LOW (ref 6.4–8.3)
Total Protein: 5.7 g/dL — ABNORMAL LOW (ref 6.4–8.3)

## 2015-06-23 LAB — CBC WITH AUTO DIFFERENTIAL
Basophils %: 0.2 % (ref 0.0–2.0)
Basophils %: 0.1 % (ref 0.0–2.0)
Basophils Absolute: 0 E9/L (ref 0.00–0.20)
Basophils Absolute: 0 E9/L (ref 0.00–0.20)
Eosinophils %: 0 % (ref 0.0–6.0)
Eosinophils %: 0 % (ref 0.0–6.0)
Eosinophils Absolute: 0 E9/L — ABNORMAL LOW (ref 0.05–0.50)
Eosinophils Absolute: 0 E9/L — ABNORMAL LOW (ref 0.05–0.50)
Hematocrit: 24.4 % — ABNORMAL LOW (ref 37.0–54.0)
Hematocrit: 20.8 % — ABNORMAL LOW (ref 37.0–54.0)
Hemoglobin: 8 g/dL — ABNORMAL LOW (ref 12.5–16.5)
Hemoglobin: 6.8 g/dL — ABNORMAL LOW (ref 12.5–16.5)
Lymphocytes %: 0.9 % — ABNORMAL LOW (ref 20.0–42.0)
Lymphocytes %: 1.7 % — ABNORMAL LOW (ref 20.0–42.0)
Lymphocytes Absolute: 0.26 E9/L — ABNORMAL LOW (ref 1.50–4.00)
Lymphocytes Absolute: 0.6 E9/L — ABNORMAL LOW (ref 1.50–4.00)
MCH: 24.3 pg — ABNORMAL LOW (ref 26.0–35.0)
MCH: 23.9 pg — ABNORMAL LOW (ref 26.0–35.0)
MCHC: 32.8 % (ref 32.0–34.5)
MCHC: 32.7 % (ref 32.0–34.5)
MCV: 74.2 fL — ABNORMAL LOW (ref 80.0–99.9)
MCV: 73 fL — ABNORMAL LOW (ref 80.0–99.9)
MPV: 9.6 fL (ref 7.0–12.0)
MPV: 10.3 fL (ref 7.0–12.0)
Monocytes %: 1.7 % — ABNORMAL LOW (ref 2.0–12.0)
Monocytes %: 0.9 % — ABNORMAL LOW (ref 2.0–12.0)
Monocytes Absolute: 0.52 E9/L (ref 0.10–0.95)
Monocytes Absolute: 0.3 E9/L (ref 0.10–0.95)
Neutrophils %: 97.4 % — ABNORMAL HIGH (ref 43.0–80.0)
Neutrophils %: 97.4 % — ABNORMAL HIGH (ref 43.0–80.0)
Neutrophils Absolute: 25.22 E9/L — ABNORMAL HIGH (ref 1.80–7.30)
Neutrophils Absolute: 29 E9/L — ABNORMAL HIGH (ref 1.80–7.30)
Platelets: 117 E9/L — ABNORMAL LOW (ref 130–450)
Platelets: 89 E9/L — ABNORMAL LOW (ref 130–450)
RBC: 3.29 E12/L — ABNORMAL LOW (ref 3.80–5.80)
RBC: 2.85 E12/L — ABNORMAL LOW (ref 3.80–5.80)
RDW: 21.5 fL — ABNORMAL HIGH (ref 11.5–15.0)
RDW: 21.4 fL — ABNORMAL HIGH (ref 11.5–15.0)
WBC: 26 E9/L — ABNORMAL HIGH (ref 4.5–11.5)
WBC: 29.9 E9/L — ABNORMAL HIGH (ref 4.5–11.5)

## 2015-06-23 LAB — PREPARE PLASMA
Dispense Status Blood Bank: TRANSFUSED
Product Code Blood Bank: 0
Product Code Blood Bank: 0

## 2015-06-23 LAB — BLOOD GAS, ARTERIAL
B.E.: -5.2 mmol/L
B.E.: -5.3 mmol/L — ABNORMAL LOW (ref <=3.0)
B.E.: -8.4 mmol/L — ABNORMAL LOW (ref <=3.0)
COHb: 0.1 % (ref 0.0–1.5)
COHb: 0.4 % (ref 0.0–1.5)
COHb: 0.7 % (ref 0.0–1.5)
Date Analyzed: 20170314
Date Analyzed: 20170314
Date Analyzed: 20170314
Date Of Collection: 20170314
Date Of Collection: 20170314
Date Of Collection: 20170314
HCO3: 18.2 mmol/L — ABNORMAL LOW (ref 22.0–26.0)
HCO3: 19.4 mmol/L
HCO3: 17.7 mmol/L — ABNORMAL LOW (ref 22.0–26.0)
HHb: 3.5 % (ref 0.0–5.0)
HHb: 32.6 %
HHb: 2.5 % (ref 0.0–5.0)
Lab: 39628
Lab: 39628
Lab: 15941
MetHb: 0.1 % (ref 0.0–1.5)
MetHb: 0.3 % (ref 0.0–1.5)
MetHb: 0.1 % (ref 0.0–1.5)
O2 Content: 14.4 mL/dL
O2 Sat: 68.1 %
O2 Sat: 96.9 % (ref 92.0–98.5)
O2 Sat: 97.5 % (ref 92.0–98.5)
O2Hb: 66.9 %
O2Hb: 96.1 % (ref 94.0–97.0)
O2Hb: 96.7 % (ref 94.0–97.0)
Operator ID: 155602
Operator ID: 155602
Operator ID: 46700
PCO2: 28.1 mmHg — ABNORMAL LOW (ref 35.0–45.0)
PCO2: 33.4 mmHg
PCO2: 38.2 mmHg (ref 35.0–45.0)
PO2: 35.7 mmHg
PO2: 85.4 mmHg (ref 60.0–100.0)
PO2: 104.8 mmHg — ABNORMAL HIGH (ref 60.0–100.0)
Pt Temp: 37 C
Pt Temp: 37 C
Pt Temp: 37 C
Time Analyzed: 51
Time Analyzed: 54
Time Analyzed: 1815
Time Collected: 42
Time Collected: 45
Time Collected: 1812
pH, Blood Gas: 7.381 (ref 7.300–7.420)
pH, Blood Gas: 7.43 (ref 7.350–7.450)
pH, Blood Gas: 7.283 — ABNORMAL LOW (ref 7.350–7.450)
tHb (est): 7.8 g/dL — ABNORMAL LOW (ref 11.5–16.5)
tHb (est): 7.8 g/dL — ABNORMAL LOW (ref 11.5–16.5)
tHb (est): 10.5 g/dL — ABNORMAL LOW (ref 11.5–16.5)

## 2015-06-23 LAB — CBC
Hematocrit: 26.6 % — ABNORMAL LOW (ref 37.0–54.0)
Hematocrit: 31.5 % — ABNORMAL LOW (ref 37.0–54.0)
Hemoglobin: 8.8 g/dL — ABNORMAL LOW (ref 12.5–16.5)
Hemoglobin: 10.3 g/dL — ABNORMAL LOW (ref 12.5–16.5)
MCH: 24.4 pg — ABNORMAL LOW (ref 26.0–35.0)
MCH: 24.3 pg — ABNORMAL LOW (ref 26.0–35.0)
MCHC: 33.1 % (ref 32.0–34.5)
MCHC: 32.7 % (ref 32.0–34.5)
MCV: 73.7 fL — ABNORMAL LOW (ref 80.0–99.9)
MCV: 74.3 fL — ABNORMAL LOW (ref 80.0–99.9)
MPV: 10.6 fL (ref 7.0–12.0)
MPV: 10.2 fL (ref 7.0–12.0)
Platelets: 100 E9/L — ABNORMAL LOW (ref 130–450)
Platelets: 136 E9/L (ref 130–450)
RBC: 3.61 E12/L — ABNORMAL LOW (ref 3.80–5.80)
RBC: 4.24 E12/L (ref 3.80–5.80)
RDW: 21.1 fL — ABNORMAL HIGH (ref 11.5–15.0)
RDW: 21.4 fL — ABNORMAL HIGH (ref 11.5–15.0)
WBC: 21.8 E9/L — ABNORMAL HIGH (ref 4.5–11.5)
WBC: 22.6 E9/L — ABNORMAL HIGH (ref 4.5–11.5)

## 2015-06-23 LAB — URINALYSIS
Glucose, Ur: NEGATIVE mg/dL
Ketones, Urine: NEGATIVE mg/dL
Leukocyte Esterase, Urine: NEGATIVE
Nitrite, Urine: NEGATIVE
Specific Gravity, UA: <=1.005 (ref 1.005–1.030)
Urobilinogen, Urine: 1 E.U./dL (ref <2.0)
pH, UA: 5.5 (ref 5.0–9.0)

## 2015-06-23 LAB — CALCIUM, IONIZED
Calcium, Ionized: 1.1 mmol/L — ABNORMAL LOW (ref 1.15–1.33)
Calcium, Ionized: 1.13 mmol/L — ABNORMAL LOW (ref 1.15–1.33)
Calcium, Ionized: 1.2 mmol/L (ref 1.15–1.33)
Calcium, Ionized: 1.18 mmol/L (ref 1.15–1.33)

## 2015-06-23 LAB — MRSA SCREEN: Active Surveillance MRSA: NEGATIVE

## 2015-06-23 LAB — MICROSCOPIC URINALYSIS

## 2015-06-23 LAB — BILIRUBIN, DIRECT: Bilirubin, Direct: 3.2 mg/dL — ABNORMAL HIGH (ref 0.0–0.3)

## 2015-06-23 LAB — TEG
Angle (Clot Strength): 72.3 degree (ref 59.0–74.0)
EPL-TEG: 0 % (ref 0.0–15.0)
G-TEG: 10.4 K d/sc (ref 4.5–11.0)
K (Clotting Time): 1.2 min (ref 1.0–3.0)
LY30 (Fibrinolysis): 0 % (ref 0.0–8.0)
MA (Max Amplitude): 67.4 mm (ref 50.0–70.0)
R (Reaction Time): 6.8 min (ref 5.0–10.0)

## 2015-06-23 LAB — PHOSPHORUS
Phosphorus: 3.7 mg/dL (ref 2.5–4.5)
Phosphorus: 3.9 mg/dL (ref 2.5–4.5)
Phosphorus: 3.7 mg/dL (ref 2.5–4.5)
Phosphorus: 4.1 mg/dL (ref 2.5–4.5)

## 2015-06-23 LAB — TYPE AND SCREEN
ABO/Rh: O POS
ABO/Rh: O POS
Antibody Screen: NEGATIVE
Antibody Screen: NEGATIVE

## 2015-06-23 LAB — HEMOGLOBIN AND HEMATOCRIT
Hematocrit: 22.4 % — ABNORMAL LOW (ref 37.0–54.0)
Hemoglobin: 7.4 g/dL — ABNORMAL LOW (ref 12.5–16.5)

## 2015-06-23 LAB — EKG 12-LEAD
Atrial Rate: 100 {beats}/min
P Axis: 70 degrees
P-R Interval: 142 ms
Q-T Interval: 370 ms
QRS Duration: 96 ms
QTc Calculation (Bazett): 477 ms
R Axis: 19 degrees
T Axis: 38 degrees
Ventricular Rate: 100 {beats}/min

## 2015-06-23 LAB — PROTIME-INR
INR: 4.4
INR: 4.6
INR: 1.7
Protime: 51 s — ABNORMAL HIGH (ref 9.3–12.4)
Protime: 53.7 s — ABNORMAL HIGH (ref 9.3–12.4)
Protime: 19 s — ABNORMAL HIGH (ref 9.3–12.4)

## 2015-06-23 LAB — LACTIC ACID
Lactic Acid: 6.9 mmol/L (ref 0.5–2.2)
Lactic Acid: 1.4 mmol/L (ref 0.5–2.2)
Lactic Acid: 2.4 mmol/L — ABNORMAL HIGH (ref 0.5–2.2)
Lactic Acid: 2.1 mmol/L (ref 0.5–2.2)
Lactic Acid: 1.9 mmol/L (ref 0.5–2.2)

## 2015-06-23 LAB — PREPARE RBC (CROSSMATCH): Dispense Status Blood Bank: TRANSFUSED

## 2015-06-23 LAB — APTT: aPTT: 46.2 s — ABNORMAL HIGH (ref 24.5–35.1)

## 2015-06-23 LAB — PLATELET MAPPING
% Inhibition AA: 19.2 %
% Inhibition ADP: 63 %
MA-AA: 64.6 mm
MA-ADP: 58.2 mm
MA-Activated: 52.8 mm
MA-TEG Baseline: 67.4 mm

## 2015-06-23 LAB — MAGNESIUM
Magnesium: 1.6 mg/dL (ref 1.6–2.6)
Magnesium: 1.6 mg/dL (ref 1.6–2.6)
Magnesium: 2.2 mg/dL (ref 1.6–2.6)
Magnesium: 1.9 mg/dL (ref 1.6–2.6)

## 2015-06-23 LAB — PLATELET CONFIRMATION

## 2015-06-23 MED ORDER — SODIUM CHLORIDE 0.9 % IV BOLUS
0.9 % | Freq: Once | INTRAVENOUS | Status: AC
Start: 2015-06-23 — End: 2015-06-23
  Administered 2015-06-23: 03:00:00 500 mL via INTRAVENOUS

## 2015-06-23 MED ORDER — DEXTROSE 5 % IV SOLN
5 % | INTRAVENOUS | Status: DC | PRN
Start: 2015-06-23 — End: 2015-06-23

## 2015-06-23 MED ORDER — LABETALOL HCL 5 MG/ML IV SOLN
5 MG/ML | Freq: Once | INTRAVENOUS | Status: AC
Start: 2015-06-23 — End: 2015-06-23
  Administered 2015-06-23: 16:00:00 10 mg via INTRAVENOUS

## 2015-06-23 MED ORDER — ONDANSETRON HCL 4 MG/2ML IJ SOLN
4 MG/2ML | Freq: Four times a day (QID) | INTRAMUSCULAR | Status: DC | PRN
Start: 2015-06-23 — End: 2015-06-29

## 2015-06-23 MED ORDER — ALBUTEROL SULFATE (2.5 MG/3ML) 0.083% IN NEBU
Freq: Four times a day (QID) | RESPIRATORY_TRACT | Status: DC
Start: 2015-06-23 — End: 2015-06-29
  Administered 2015-06-23 – 2015-06-29 (×17): 2.5 mg via RESPIRATORY_TRACT

## 2015-06-23 MED ORDER — DIPHENHYDRAMINE HCL 50 MG/ML IJ SOLN
50 MG/ML | Freq: Once | INTRAMUSCULAR | Status: AC
Start: 2015-06-23 — End: 2015-06-23
  Administered 2015-06-23: 15:00:00 25 mg via INTRAVENOUS

## 2015-06-23 MED ORDER — DEXTROSE 50 % IV SOLN
50 % | INTRAVENOUS | Status: DC | PRN
Start: 2015-06-23 — End: 2015-06-25

## 2015-06-23 MED ORDER — PROPOFOL 1000 MG/100ML IV EMUL
1000 MG/100ML | INTRAVENOUS | Status: AC
Start: 2015-06-23 — End: 2015-06-23
  Administered 2015-06-23: 10 via INTRAVENOUS

## 2015-06-23 MED ORDER — FENTANYL CITRATE (PF) 100 MCG/2ML IJ SOLN
100 MCG/2ML | INTRAMUSCULAR | Status: DC | PRN
Start: 2015-06-23 — End: 2015-06-24
  Administered 2015-06-23: 22:00:00 25 ug via INTRAVENOUS

## 2015-06-23 MED ORDER — TOBRAMYCIN SULFATE 80 MG/2ML IJ SOLN
80 MG/2ML | Freq: Three times a day (TID) | INTRAMUSCULAR | Status: DC
Start: 2015-06-23 — End: 2015-06-23
  Administered 2015-06-23: 12:00:00 176 mg via INTRAVENOUS

## 2015-06-23 MED ORDER — VECURONIUM BROMIDE 10 MG IV SOLR
10 MG | INTRAVENOUS | Status: DC
Start: 2015-06-23 — End: 2015-06-24

## 2015-06-23 MED ORDER — SODIUM CHLORIDE 0.9 % IV BOLUS
0.9 % | Freq: Once | INTRAVENOUS | Status: DC
Start: 2015-06-23 — End: 2015-06-23

## 2015-06-23 MED ORDER — FENTANYL CITRATE (PF) 100 MCG/2ML IJ SOLN
100 MCG/2ML | INTRAMUSCULAR | Status: AC
Start: 2015-06-23 — End: 2015-06-23

## 2015-06-23 MED ORDER — LORAZEPAM 2 MG/ML IJ SOLN
2 MG/ML | Freq: Once | INTRAMUSCULAR | Status: AC
Start: 2015-06-23 — End: 2015-06-23
  Administered 2015-06-23: 21:00:00 0.5 mg via INTRAVENOUS

## 2015-06-23 MED ORDER — FENTANYL CITRATE (PF) 100 MCG/2ML IJ SOLN
100 MCG/2ML | Freq: Once | INTRAMUSCULAR | Status: AC
Start: 2015-06-23 — End: 2015-06-23
  Administered 2015-06-23: 15:00:00 50 ug via INTRAVENOUS

## 2015-06-23 MED ORDER — DEXTROSE 5 % IV SOLN (MINI-BAG)
5 % | Freq: Three times a day (TID) | INTRAVENOUS | Status: DC
Start: 2015-06-23 — End: 2015-06-25
  Administered 2015-06-23 – 2015-06-25 (×6): 3.375 g via INTRAVENOUS

## 2015-06-23 MED ORDER — STERILE WATER FOR INJECTION IJ SOLN
INTRAMUSCULAR | Status: DC
Start: 2015-06-23 — End: 2015-06-24

## 2015-06-23 MED ORDER — ACETAMINOPHEN 325 MG PO TABS
325 MG | ORAL | Status: DC | PRN
Start: 2015-06-23 — End: 2015-06-29

## 2015-06-23 MED ORDER — MEROPENEM 1 G IV SOLR
1 g | Freq: Three times a day (TID) | INTRAVENOUS | Status: DC
Start: 2015-06-23 — End: 2015-06-23
  Administered 2015-06-23 (×2): 1 g via INTRAVENOUS

## 2015-06-23 MED ORDER — CALCIUM GLUCONATE 10 % IV SOLN
10 % | Freq: Once | INTRAVENOUS | Status: AC
Start: 2015-06-23 — End: 2015-06-23
  Administered 2015-06-23: 12:00:00 1 g via INTRAVENOUS

## 2015-06-23 MED ORDER — NORMAL SALINE FLUSH 0.9 % IV SOLN
0.9 % | INTRAVENOUS | Status: DC | PRN
Start: 2015-06-23 — End: 2015-06-29
  Administered 2015-06-24 – 2015-06-29 (×4): 10 mL via INTRAVENOUS

## 2015-06-23 MED ORDER — FENTANYL CITRATE (PF) 100 MCG/2ML IJ SOLN
100 MCG/2ML | INTRAMUSCULAR | Status: DC | PRN
Start: 2015-06-23 — End: 2015-06-24
  Administered 2015-06-23: 50 ug via INTRAVENOUS

## 2015-06-23 MED ORDER — INSULIN LISPRO 100 UNIT/ML SC SOLN
100 UNIT/ML | SUBCUTANEOUS | Status: DC
Start: 2015-06-23 — End: 2015-06-25

## 2015-06-23 MED ORDER — PROTHROMBIN COMPLEX CONC HUMAN 1000 UNITS IV KIT
1000 units | Freq: Once | INTRAVENOUS | Status: AC
Start: 2015-06-23 — End: 2015-06-23
  Administered 2015-06-23: 18:00:00 3000 [IU] via INTRAVENOUS

## 2015-06-23 MED ORDER — SODIUM CHLORIDE 0.9 % IV BOLUS
0.9 % | Freq: Once | INTRAVENOUS | Status: AC
Start: 2015-06-23 — End: 2015-06-23
  Administered 2015-06-23: 23:00:00 1000 mL via INTRAVENOUS

## 2015-06-23 MED ORDER — NORMAL SALINE FLUSH 0.9 % IV SOLN
0.9 % | Freq: Two times a day (BID) | INTRAVENOUS | Status: DC
Start: 2015-06-23 — End: 2015-06-29
  Administered 2015-06-23 – 2015-06-29 (×12): 10 mL via INTRAVENOUS

## 2015-06-23 MED ORDER — FENTANYL 500 MCG IN NS 100 ML INFUSION
Status: AC
Start: 2015-06-23 — End: 2015-06-23
  Administered 2015-06-23: 25 via INTRAVENOUS

## 2015-06-23 MED ORDER — DEXTROSE 5 % IV SOLN
5 % | Freq: Once | INTRAVENOUS | Status: AC
Start: 2015-06-23 — End: 2015-06-23
  Administered 2015-06-23: 16:00:00 10 mg via INTRAVENOUS

## 2015-06-23 MED ORDER — PANTOPRAZOLE SODIUM 40 MG IV SOLR
40 MG | Freq: Every day | INTRAVENOUS | Status: DC
Start: 2015-06-23 — End: 2015-06-25
  Administered 2015-06-23 – 2015-06-25 (×3): 40 mg via INTRAVENOUS

## 2015-06-23 MED ORDER — LABETALOL HCL 5 MG/ML IV SOLN
5 MG/ML | INTRAVENOUS | Status: DC | PRN
Start: 2015-06-23 — End: 2015-06-25

## 2015-06-23 MED ORDER — SODIUM CHLORIDE 0.9 % IV BOLUS
0.9 % | Freq: Once | INTRAVENOUS | Status: AC
Start: 2015-06-23 — End: 2015-06-23
  Administered 2015-06-23: 19:00:00 500 mL via INTRAVENOUS

## 2015-06-23 MED ORDER — SODIUM CHLORIDE 0.9 % IV BOLUS
0.9 % | Freq: Once | INTRAVENOUS | Status: AC
Start: 2015-06-23 — End: 2015-06-23
  Administered 2015-06-23: 17:00:00 1000 mL via INTRAVENOUS

## 2015-06-23 MED ORDER — MAGNESIUM HYDROXIDE 400 MG/5ML PO SUSP
400 MG/5ML | Freq: Every day | ORAL | Status: DC | PRN
Start: 2015-06-23 — End: 2015-06-29

## 2015-06-23 MED ORDER — GLUCAGON HCL RDNA (DIAGNOSTIC) 1 MG IJ SOLR
1 MG | INTRAMUSCULAR | Status: DC | PRN
Start: 2015-06-23 — End: 2015-06-25

## 2015-06-23 MED ORDER — SODIUM CHLORIDE 0.9 % IV SOLN
0.9 % | INTRAVENOUS | Status: DC
Start: 2015-06-23 — End: 2015-06-25
  Administered 2015-06-23 – 2015-06-25 (×7): via INTRAVENOUS

## 2015-06-23 MED ORDER — GLUCOSE 40 % PO GEL
40 % | ORAL | Status: DC | PRN
Start: 2015-06-23 — End: 2015-06-25

## 2015-06-23 MED ORDER — SODIUM CHLORIDE 0.9 % IV BOLUS
0.9 % | Freq: Once | INTRAVENOUS | Status: DC
Start: 2015-06-23 — End: 2015-06-24

## 2015-06-23 MED ORDER — SODIUM CHLORIDE 0.9 % IJ SOLN
0.9 % | Freq: Every day | INTRAMUSCULAR | Status: DC
Start: 2015-06-23 — End: 2015-06-25
  Administered 2015-06-23 – 2015-06-25 (×3): 10 mL via INTRAVENOUS

## 2015-06-23 MED ORDER — MIDAZOLAM HCL 2 MG/2ML IJ SOLN
2 MG/ML | INTRAMUSCULAR | Status: AC
Start: 2015-06-23 — End: 2015-06-23
  Administered 2015-06-23: 2 via INTRAVENOUS

## 2015-06-23 MED ORDER — MAGNESIUM SULFATE 2000 MG/50 ML IVPB PREMIX
2 GM/50ML | Freq: Once | INTRAVENOUS | Status: AC
Start: 2015-06-23 — End: 2015-06-23
  Administered 2015-06-23: 11:00:00 2 g via INTRAVENOUS

## 2015-06-23 MED FILL — PIPERACILLIN SOD-TAZOBACTAM SO 3.375 (3-0.375) G IV SOLR: 3.375 (3-0.375) g | INTRAVENOUS | Qty: 3.38

## 2015-06-23 MED FILL — LABETALOL HCL 5 MG/ML IV SOLN: 5 MG/ML | INTRAVENOUS | Qty: 20

## 2015-06-23 MED FILL — FENTANYL CITRATE (PF) 100 MCG/2ML IJ SOLN: 100 MCG/2ML | INTRAMUSCULAR | Qty: 2

## 2015-06-23 MED FILL — NORMAL SALINE FLUSH 0.9 % IV SOLN: 0.9 % | INTRAVENOUS | Qty: 10

## 2015-06-23 MED FILL — MIDAZOLAM HCL 2 MG/2ML IJ SOLN: 2 MG/ML | INTRAMUSCULAR | Qty: 2

## 2015-06-23 MED FILL — MEROPENEM 1 G IV SOLR: 1 g | INTRAVENOUS | Qty: 1

## 2015-06-23 MED FILL — VECURONIUM BROMIDE 10 MG IV SOLR: 10 MG | INTRAVENOUS | Qty: 10

## 2015-06-23 MED FILL — TOBRAMYCIN SULFATE 80 MG/2ML IJ SOLN: 80 MG/2ML | INTRAMUSCULAR | Qty: 4.4

## 2015-06-23 MED FILL — DIPHENHYDRAMINE HCL 50 MG/ML IJ SOLN: 50 MG/ML | INTRAMUSCULAR | Qty: 1

## 2015-06-23 MED FILL — MAGNESIUM SULFATE 2 GM/50ML IV SOLN: 2 GM/50ML | INTRAVENOUS | Qty: 50

## 2015-06-23 MED FILL — FENTANYL 500 MCG IN NS 100 ML INFUSION: Qty: 100

## 2015-06-23 MED FILL — VITAMIN K1 10 MG/ML IJ SOLN: 10 MG/ML | INTRAMUSCULAR | Qty: 1

## 2015-06-23 MED FILL — STERILE WATER FOR INJECTION IJ SOLN: INTRAMUSCULAR | Qty: 10

## 2015-06-23 MED FILL — NORMAL SALINE FLUSH 0.9 % IV SOLN: 0.9 % | INTRAVENOUS | Qty: 40

## 2015-06-23 MED FILL — LORAZEPAM 2 MG/ML IJ SOLN: 2 MG/ML | INTRAMUSCULAR | Qty: 1

## 2015-06-23 MED FILL — CALCIUM GLUCONATE 10 % IV SOLN: 10 % | INTRAVENOUS | Qty: 10

## 2015-06-23 MED FILL — NORMAL SALINE FLUSH 0.9 % IV SOLN: 0.9 % | INTRAVENOUS | Qty: 20

## 2015-06-23 MED FILL — PROTONIX 40 MG IV SOLR: 40 MG | INTRAVENOUS | Qty: 40

## 2015-06-23 MED FILL — ALBUTEROL SULFATE (2.5 MG/3ML) 0.083% IN NEBU: RESPIRATORY_TRACT | Qty: 3

## 2015-06-23 MED FILL — KCENTRA 1000 UNITS IV KIT: 1000 units | INTRAVENOUS | Qty: 120

## 2015-06-23 MED FILL — HUMALOG 100 UNIT/ML SC SOLN: 100 UNIT/ML | SUBCUTANEOUS | Qty: 3

## 2015-06-23 MED FILL — DIPRIVAN 1000 MG/100ML IV EMUL: 1000 MG/100ML | INTRAVENOUS | Qty: 100

## 2015-06-23 NOTE — Other (Signed)
Patient was intubated, but would not cease pulling at ETT. RN was unable to redirect patient to cease such behavior. Patient's behavior is a danger to his health and wellbeing was restrained for his safety.

## 2015-06-23 NOTE — Plan of Care (Signed)
Problem: Falls - Risk of  Goal: Absence of falls  Outcome: Met This Shift

## 2015-06-23 NOTE — Progress Notes (Signed)
70 year-old with history of pancreatic cancer status post hepaticoduodenostomy in September 2016, now presenting with a heterogenous air-containing hepatic collection. Request made for percutaneous drainage. Procedure risks, benefits, and alternatives were discussed with the patient. After all of his questions were answered, informed written consent was obtained.    Vitals:    06/23/15 1700   BP:    Pulse: 100   Resp: (!) 36   Temp: 98.3 ??F (36.8 ??C)   SpO2: 96%       Lab Results   Component Value Date    WBC 21.8 (H) 06/23/2015    HGB 8.8 (L) 06/23/2015    HCT 26.6 (L) 06/23/2015    MCV 73.7 (L) 06/23/2015    PLT 100 (L) 06/23/2015       Lab Results   Component Value Date    INR 1.7 06/23/2015    INR 4.6 06/23/2015    INR 4.4 06/22/2015    PROTIME 19.0 (H) 06/23/2015    PROTIME 53.7 (H) 06/23/2015    PROTIME 51.0 (H) 06/22/2015

## 2015-06-23 NOTE — Progress Notes (Signed)
Patient taken to Cat Scan @ 15:30, positioned on table supine with O2 and monitoring equipment attached. Patient scanned and images reviewed by Dr Lorelee CoverAugustin. Patient prepped and draped per protocol. 10 French tube placed RUQ with Cat Scan guidance by Dr Lorelee CoverAugustin @ 16:16 , 20 ccspecimen of thick tan foul smelling fluid obtained and sent to lab for culture and gram stain. Patient re-scanned. Puncture site cleansed, revolution applied to secure tube and tube connected to drain bag. Patient tolerated well. Procedure completed @ 16:25.  Patient transported back to room @ 16:45 on O2 and Monitor with his nurse, report given.

## 2015-06-23 NOTE — Consults (Signed)
Palliative Medicine   Consult Note  Provider: Heloise Ochoa RN, MSN, Arthur Hunt Day: 2    Referring Provider:  Dr. Johnnette Hunt    Reason for Consult:  __X__Advanced Care Planning  __X__Assist with goals of care  ____Psychosocial support  ____Symptom Management    Recommendations:  1. Psychosocial support of patient and family:  Arthur Hunt [phone]) is the wife.  Arthur Hunt [phone]) is the daughter.  2. Assist with goals of care:  Continue with current care at this time.  3. Advance Care Planning:  Completed, documents requested.   4. Anticipatory Guidance:  70 year old male with pancreatic CA, now with a liver abscess and possible right sided ischemic colitis.  Goals of care and code status need discussed.  5. Code Status:  DNR-CCA:  Discussed with the wife and daughters.  They are agreeable to Arthur Hunt, which is inline with the patient's goals  6. Symptom Management:  Per medical Team  7. From a Palliative Medicine standpoint, there is no need to delay discharge.    Chief complaint: Sepsis    HPI:   Arthur Hunt is a 70 y.o. male with significant past medical history of Pancreatic CA on chemotherapy with [facility], and has completed 5 chemotherapy treatments.  A whipple was attempted bye Dr. Lyn Hunt in September of 2016, but was aborted due to vascular invasion. He presented to the [facility] after being found to be asymptomatically hypotensive by home health nurse.  He was transferred to the [facility] after CT showed hepatic abscess of the right lobe and concern of portal venous air and right sided colonic thickening with possible intramural air.  Palliative Medicine has been consulted to assist pt/family with establishing goals of care and complex decision making.     Met with the patient and his family.  PM was introduced.  We discussed GOC and code status.  The wife and daughters are agreeable to [facility] Hunt at this time.  The DNR form was completed and place on the  soft chart and a copy given to the family.  The Moran for the patient is to continue the current care at this time.  He is for [facility] Hunt drain when INR is stable.  He appeared to have a reaction to an FFP infusion earlier this morning.  We did briefly discussed PM for symptom management after this acute illness has resolved and the family was very interested. The PM outpatient clinic for symptom management was discussed.  An attempt to coordinate an outpatient visit after discharge will be made, if the patient is agreeable.  The family sights decrease appetite and weight loss as his worse symptom at this time, along with possibly anxiety and depression.  Much support was provided.  PM will continue to follow.      Past Medical History   Diagnosis Date   ??? Liver abscess      right lobe of liver   ??? Primary pancreatic cancer  [facility]        Past Surgical History   Procedure Laterality Date   ??? Biliopancreatic div w duodenal       hepatoduodenostomy       No current facility-administered medications on file prior to encounter.      No current outpatient prescriptions on file prior to encounter.       Allergies:  Adhesive tape    Social history:  Social  History     Social History   ??? Marital status: Married     Spouse name: N/A   ??? Number of children: N/A   ??? Years of education: N/A     Social History Main Topics   ??? Smoking status: Former Smoker     Types: Cigarettes     Quit date: 06/21/1985   ??? Smokeless tobacco: None   ??? Alcohol use No   ??? Drug use: None   ??? Sexual activity: Not Asked     Other Topics Concern   ??? None     Social History Narrative   ??? None         Family history:  No family history on file.      ----- REVIEW OF SYSTEMS -----  The patient was not able to fully participate r/t sedation from recent fentanyl administration.    Vitals:   Vitals:    06/23/15 0700 06/23/15 0706 06/23/15 0800 06/23/15 0900   BP:  (!) 113/56     Pulse: 68 68 68 75   Resp: '15 16 15 17   ' Temp:  98.4 ??F (36.9 ??C) 97.8 ??F (36.6 ??C)     TempSrc:   Oral    SpO2: 98%  97% 100%   Weight:       Height:           Physical Exam:  Gen:  Drowsy, in no acute distress  HEENT:  Pupils equal and round; reactive to light  Neck:  Supple  Lungs:  CTA bilaterally; no audible rhonchi or wheezes noted  Heart::   RRR, no murmur, rub, or gallop noted during exam  Abd:   Soft, mild RUQ tenderness, non distended  Ext:  Moving all extremities, no edema, pulses present  Skin:  Warm and dry, slight jaundice  Neuro:  Alert, oriented x 3; following commands    Labs     CBC:   Lab Results   Component Value Date    WBC 29.9 06/23/2015    RBC 2.85 06/23/2015    HGB 7.4 06/23/2015    HCT 22.4 06/23/2015    PLT 89 06/23/2015    MCV 73.0 06/23/2015     BMP:    Lab Results   Component Value Date    NA 134 06/23/2015    K 4.1 06/23/2015    CL 105 06/23/2015    CO2 18 06/23/2015    BUN 13 06/23/2015    CREATININE 0.8 06/23/2015    GLUCOSE 122 06/23/2015    CALCIUM 7.4 06/23/2015     PT/INR:    Lab Results   Component Value Date    PROTIME 53.7 06/23/2015    INR 4.6 06/23/2015       Cultures:  Pending    Imaging:  None available at this time.      Impression  Principal Problem:    Septic shock ([facility])  Active Problems:    Liver abscess    Primary pancreatic cancer  (Arthur Hunt)    Right sided colitis    Hyperbilirubinemia    Jaundice    Lactic acidosis      Assessment/Plan:   Arthur Hunt is a 70 y.o. male with sepsis r/t liver abscess and possible ischemic colitis.    Continue Current care plan  DNR-CCA  Will follow along    Arthur Ochoa RN, MSN, FNP-BC  Palliative Medicine    Time in minutes:  70    More than 50% of  this interaction was related to counseling and information given r/t disease process; plan of care; and code status.      Thank you for allowing Palliative Medicine to participate in this patient's care.       Patient assessment and plan discussed with Arthur Hunt

## 2015-06-23 NOTE — Plan of Care (Signed)
Problem: Falls - Risk of  Goal: Absence of falls  Outcome: Met This Shift    Problem: Risk for Impaired Skin Integrity  Goal: Tissue integrity - skin and mucous membranes  Structural intactness and normal physiological function of skin and  mucous membranes.   Outcome: Met This Shift

## 2015-06-23 NOTE — Plan of Care (Signed)
Problem: Restraint Use - Nonviolent/Non-Self-Destructive Behavior:  Goal: Absence of restraint indications  Absence of restraint indications   Outcome: Not Met This Shift  Goal: Absence of restraint-related injury  Absence of restraint-related injury   Outcome: Met This Shift

## 2015-06-23 NOTE — Progress Notes (Signed)
Patient c/o severe lower back/flank pain. SP02 86%, all other VS stable. 02 applied, FFP stopped. Dr. Carmelina Nounmiladore called and made aware. Will place new orders.

## 2015-06-23 NOTE — Other (Signed)
Spoke with pt's nurse - Aundra MilletMegan. Order for INR placed and needs to be corrected. Was informed that pt may have surgery today

## 2015-06-23 NOTE — Progress Notes (Addendum)
Delayed entry:    Called to bedside for worsening respiratory distress following IR procedure. Patient tachypneic with mild wheezing on auscultation, RR of 30+ and hypotensive with systolic BP hovering around 80. Patient remains alert and oriented. Reviewed post-IR labs with noted metabolic acidosis, likely secondary to intra-abdominal sepsis, and concern for impending respiratory failure. Spoke with chief resident on call who came to evaluate the patient as well. Attempted to improve respiratory status with albuterol treatment without any noted improvement. Discussion was held with family about impending respiratory failure and recommendation for intubation. Family was agreeable to intubation at this time. Attending on call was updated, respiratory was called, and patient was intubated as described in procedure note. Dr. Danella DeisGruber was present during intubation of the patient.     Destinee Taber A Haruo Stepanek, DO  06/23/15  9:24 PM

## 2015-06-23 NOTE — Other (Signed)
Dr Horris LatinoZimmerly at bedside speaking with family, discussing cxr and lab results.

## 2015-06-23 NOTE — Procedures (Signed)
Arthur Hunt is a 70 y.o. male patient.  1. Septic shock (HCC)    2. Malignant neoplasm of pancreas, unspecified location of malignancy (HCC)    3. Liver abscess    4. Colitis, acute      Past Medical History   Diagnosis Date   ??? Liver abscess      right lobe of liver   ??? Primary pancreatic cancer  (HCC)      Blood pressure 122/73, pulse 94, temperature 99 ??F (37.2 ??C), temperature source Axillary, resp. rate 26, height 6' (1.829 m), weight 191 lb 8 oz (86.9 kg), SpO2 99 %.    Intubation  Date/Time: 06/23/2015 8:10 PM  Performed by: Cornelia CopaOMAIN, Shonteria Abeln  Authorized by: Cornelia CopaOMAIN, Avraj Lindroth   Consent: Verbal consent obtained.  Risks and benefits: risks, benefits and alternatives were discussed  Consent given by: patient and spouse  Patient understanding: patient states understanding of the procedure being performed  Test results: test results available and properly labeled  Patient identity confirmed: verbally with patient and arm band  Indications: respiratory distress  Intubation method: video-assisted  Patient status: sedated  Preoxygenation: BVM and nonrebreather mask  Pretreatment medications: fentanyl and midazolam  Laryngoscope size: Mac 3  Tube size: 7.5 mm  Tube type: cuffed  Number of attempts: 1  Cricoid pressure: no  Cords visualized: yes  Post-procedure assessment: chest rise and ETCO2 monitor  Breath sounds: equal  Cuff inflated: yes  ETT to lip: 25 cm  Tube secured with: ETT holder  Chest x-ray interpreted by me.  Chest x-ray findings: endotracheal tube too low  Tube repositioned: tube repositioned successfully  Patient tolerance: Patient tolerated the procedure well with no immediate complications  Comments: Dr. Danella DeisGruber was present for this procedure.           Cornelia Copaonsandre Jaikob Borgwardt, MD  06/23/2015

## 2015-06-23 NOTE — Procedures (Signed)
Arthur Hunt is a 70 y.o. male patient.  1. Septic shock (HCC)    2. Malignant neoplasm of pancreas, unspecified location of malignancy (HCC)    3. Liver abscess    4. Colitis, acute      Past Medical History   Diagnosis Date   ??? Liver abscess      right lobe of liver   ??? Primary pancreatic cancer  (HCC)      Blood pressure (!) 81/50, pulse 89, temperature 100.5 ??F (38.1 ??C), temperature source Axillary, resp. rate 18, height 6' (1.829 m), weight 191 lb 8 oz (86.9 kg), SpO2 97 %.    Insert Arterial Line  Date/Time: 06/23/2015 12:47 AM  Performed by: Horris LatinoZIMMERLY, [place] Whedbee A.  Authorized by: Gae GallopZIMMERLY, Bladen Umar A.   Consent: Written consent obtained.  Risks and benefits: risks, benefits and alternatives were discussed  Consent given by: patient  Patient identity confirmed: arm band  Indications: hemodynamic monitoring  Location: right radial  Sedation:  Patient sedated: no    Allen's test normal: yes  Needle gauge: 20  Seldinger technique: Seldinger technique used  Number of attempts: 1  Post-procedure: line sutured and dressing applied  Patient tolerance: Patient tolerated the procedure well with no immediate complications          [name], DO  06/23/2015

## 2015-06-23 NOTE — Progress Notes (Signed)
Nutrition Assessment    Type and Reason for Visit: Initial, Positive Nutrition Screen    Nutrition Recommendations: Continue NPO   Advance nutrition when medically appropriate.   To order ONS w/ diet advancement.    Malnutrition Assessment:  ?? Malnutrition Status: Meets the criteria for severe malnutrition  ?? Findings of the 6 clinical characteristics of malnutrition (Minimum of 2 out of 6 clinical characteristics is required to make the diagnosis of moderate or severe Protein Calorie Malnutrition based on AND/ASPEN Guidelines):  1. Energy Intake-Less than or equal to 75%, greater than or equal to 3 months    2. Weight Loss-20% loss or greater, in 6 months  3. Fat Loss-Mild subcutaneous fat loss, Orbital  4. Muscle Loss-Moderate muscle mass loss, Clavicles (pectoralis and deltoids), Thigh (quadriceps)  5. Fluid Accumulation-No significant fluid accumulation,    6. Grip Strength-Not measured    Nutrition Diagnosis:   ?? Problem: Severe malnutrition, in context of chronic illness  ?? Etiology: related to Catabolic illness, pancreatic ca w/ active chemotherapy  ??? Signs and symptoms:  as evidenced by Weight loss, Moderate muscle loss, Diet history of poor intake (noted 30% wt loss x6 months, mild fat loss)    Nutrition Assessment:  ?? Nutrition-Focused Physical Findings: soft abd, active BS, +2 edema, + I/Os, noted bili 3.5, weakness  ?? Wound Type: None  ?? Current Nutrition Therapies:  ?? Oral Diet Orders: NPO   ?? Anthropometric Measures:  ?? Ht: 6' (182.9 cm)   ?? Current Body Wt: 191 lb (86.6 kg) (3/13 actual; verified bedscale (questionable accuracy) however per daughter/wife standing scale wt x2 wks PTA on chemo appt pt was 166#)  ?? Usual Body Wt: 240 lb (108.9 kg) (per family x6 months PTA)  ?? % Weight Change: using 166# wt noted 74# wt loss (30.8%) x6 months, family confirmed an approximate 70# wt loss ,     ?? Ideal Body Wt: 178 lb (80.7 kg), % Ideal Body 93% (using wt per dr visit x2 wks PTA)  ?? BMI Classification:  BMI 25.0 - 29.9 Overweight  ?? Comparative Standards (Estimated Nutrition Needs):  ?? Estimated Daily Total Kcal: 20-2100 (MSJ 1562 x1.3= 2030)  ?? Estimated Daily Protein (g): 105-115 (1.3-1.5gm/kg (stated 166# wt))    Estimated Intake vs Estimated Needs: Intake Less Than Needs    Nutrition Risk Level: Moderate    Nutrition Interventions:   Continued Inpatient Monitoring, Education Initiated, Coordination of Care (spoke w/ pt and pt family regarding wt loss, po intake PTA)    Nutrition Evaluation:   ?? Evaluation: Goals set   ?? Goals: Nutrition progression     ?? Monitoring: Diet Progression, NPO Status, Skin Integrity, Fluid Balance, Ascites/Edema, Weight, Comparative Standards, Pertinent Labs, Chewing/Swallowing    See Adult Nutrition Doc Flowsheet for more detail.     Electronically signed by Georgiann CockerMegan Serin Thornell, RD, LD on 06/23/15 at 12:11 PM  Contact Number: [phone]

## 2015-06-23 NOTE — Progress Notes (Signed)
Palliative meds consulted on this pt.

## 2015-06-23 NOTE — Plan of Care (Signed)
Problem: Nutrition  Goal: Optimal nutrition therapy  Outcome: Ongoing  Nutrition Problem: Severe malnutrition, in context of chronic illness  Nutritional Goals: Nutrition progression   Follow Up Date: 3/20

## 2015-06-23 NOTE — Progress Notes (Signed)
Patient seen due to worsening respiratory function this afternoon.   Tachypneic with markedly increased work of breathing.   Lung clear with inspiratory and expiratory wheezed.   Given Albuterol treatment without improvement.   Had multiple extensive discussions with patient and his daughters and spouse including discussion regarding the patient's terminal diagnosis of pancreatic cancer, his current septic shock and the [facility](Ok intubation, no CPR, no defibrillation) after their earlier discussion with palliative care medicine.   The patient and his family all elected for intubation to support the patient's worsening respiratory insufficiency.   We proceeded with intubation.   This was discussed with Dr. Danella DeisGruber who was present for the intubation.     Arthur Copaonsandre Armonii Sieh, MD

## 2015-06-23 NOTE — Care Coordination-Inpatient (Signed)
SOCIAL WORK AND DISCHARGE PLANNING: MET WITH Pt, WIFE AND DAUGHTER. Pt AND WIFE WITH 70 Y/O GRANDDAUGHTER WHO IS IN HIGH SCHOOL LIVE TOGETHER. Pt WAS ACTIVE WITH [facility] AND WAS GETTING CHEMO EVERY 2 WEEKS ON Monday AND TAKEN OFF ON WED. HE IS RETIRED AS IS WIFE. 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 HE ALSO HAS A PCP IN [place], [place]. HE WAS INDEPENDENT WITH BATHING AND DRESSING, NEEDED ASSISTANCE WITH MEDS AND NO LONGER DRIVES FOR A SHORT TIME NOW. HE HAS BEEN UNSTEADY ON HIS FEET FOR THE PAST SEVERAL WEEKS PER FAMILY AND THEY WANT PT/OT EVAL. LEFT STICKY NOTE FOR PCP. THEY HAVE A BILEVEL HOME WITH 1 STEP IN THEN BED AND BATH ARE ON THE MAIN LEVEL. NO DME OR HHC PTA. HAS NEVER BEEN TO A SAR. SW TO FOLLOW. Sharlee Blew  06/23/2015

## 2015-06-23 NOTE — Procedures (Signed)
Full report to follow.

## 2015-06-23 NOTE — Progress Notes (Signed)
Critical Care Attending Note  ??  ICU Adm Date: 06/22/15  ??  Diagnosis: Colitis   ??  Surgical Procedures: none  ??  Overnight Events: stable  ??  Physical Exam:   Visit Vitals   ??? BP (!) 113/56   ??? Pulse 68   ??? Temp 98.4 ??F (36.9 ??C)   ??? Resp 16   ??? Ht 6' (1.829 m)   ??? Wt 191 lb 8 oz (86.9 kg)   ??? SpO2 98%   ??? BMI 25.97 kg/m2     Alert and oriented, purposeful  Unlabored respirations with bilateral breath sounds  Regular rhythm and normal sounds  Flat soft, non-tender abdomen.  ??  Problems and Plan:   Colitis on CT scan, reviewed with Radiologist. No sign of perforation or ischemia. Elevated WBC to 30k and Lactate to 1.4, will monitor and consider exploratory laparotomy if patient not improve with hydration.  ??  Attestation: Reviewed Surgical Residents documentation and agree with the evaluation and plan.   ??  Critical Care: 35 minutes evaluating patient, reviewing chart and managing patient with potentially life threatening condition at risk for deterioration and even death.

## 2015-06-23 NOTE — Progress Notes (Signed)
Surgical Intensive Care Unit  Daily Progress Note    Date of admission:  06/22/2015    Reason for ICU transfer:  Intra-abdominal sepsis    Subjective:  Awake, alert, oriented. Denies abdominal pain, nausea, vomiting.       Physical Exam:  Visit Vitals   ??? BP (!) 113/56   ??? Pulse 68   ??? Temp 98.4 ??F (36.9 ??C)   ??? Resp 16   ??? Ht 6' (1.829 m)   ??? Wt 191 lb 8 oz (86.9 kg)   ??? SpO2 98%   ??? BMI 25.97 kg/m2     CONSTITUTIONAL:  awake, alert, cooperative, no apparent distress, and appears stated age  LUNGS:  No increased work of breathing, good air exchange, clear to auscultation bilaterally, no crackles or wheezing  CARDIOVASCULAR:  Regular rate and rhythm  ABDOMEN:  Soft, minimal RUQ tenderness with noted fullness, nondistended, well healed midline laparotomy scar  MUSCULOSKELETAL:  There is no redness, warmth, or swelling of the joints.  Full range of motion noted.  Motor strength is 5 out of 5 all extremities bilaterally.  Tone is normal.  SKIN:  jaundice noted    ASSESSMENT / PLAN:  ?? Neuro: [id number]. Add pain medication if needed.   ?? CV: Hypotension 2/2 septic shock. Aggressive IVF resuscitation. Hx xarelto use with elevated INR. 4 U FFP ordered. Re-check INR post-transfusion. Monitor hemodynamics with goal MAP >65.   ?? Pulm: No acute issues. Monitor RR, SpO2  ?? GI: Hx pancreatic cancer s/p aborted Whipple 2/2 vascular invasion, [facility] abscess with portal venous vs portal gas, right sided colitis w concern for ischemic infarct and intramural air. Consider repeat imaging this AM. Hyperbilirubinemia 2/2 pancreatic cancer. Consider hem/onc consult.   ?? Renal:  Hypocalcemia, replacement ordered. Lactic acidosis of 6.9 on admission now greatly improved, currently 1.4. Continue lactic acid q6. Monitor BUN/Crt, UOP.  ?? ID: Worsening leukocytosis. Merrem, tobramycin ordered for intra-abdominal sepsis. Pan cultures sent. Repeat labs later this AM. Monitor daily  CBC, temperature curve.   ?? Endo: Mild hyperglycemia. Monitor glucose  ?? MSK: Diffusely jaundiced 2/2 hyperbilirubinemia. No other issues.  ?? Heme: Anemia with hgb 6.8. Transfuse 1 U irradiated PRBC. Repeat labs later this AM.        Bowel regimen: MoM  DVT proph:  SCDs  GI proph:  pepcid   Glucose protocol: SSI  Mouth/eye care: Per patient  Foley:   , keep in place for critical care monitoring of fluid balance.  CVC sites:  R IJ TLC, R radial art line   Ancillary consults: Hem/onc  Family Update: Updated last evening      Dispo: Transfuse FFP, PRBC. Repeat labs later this AM. Possible OR pending repeat labs. Consider repeat imaging.     Antha Niday A Chyann Ambrocio, DO  06/23/15  7:31 AM

## 2015-06-24 ENCOUNTER — Encounter: Admit: 2015-06-24

## 2015-06-24 LAB — COMPREHENSIVE METABOLIC PANEL
ALT: 24 U/L (ref 0–40)
AST: 52 U/L — ABNORMAL HIGH (ref 0–39)
Albumin: 1.2 g/dL — ABNORMAL LOW (ref 3.5–5.2)
Alkaline Phosphatase: 322 U/L — ABNORMAL HIGH (ref 40–129)
Anion Gap: 15 mmol/L (ref 7–16)
BUN: 16 mg/dL (ref 8–23)
CO2: 16 mmol/L — ABNORMAL LOW (ref 22–29)
Calcium: 7.1 mg/dL — ABNORMAL LOW (ref 8.6–10.2)
Chloride: 108 mmol/L — ABNORMAL HIGH (ref 98–107)
Creatinine: 0.7 mg/dL (ref 0.7–1.2)
GFR African American: >60
GFR Non-African American: >60 mL/min/{1.73_m2} (ref >=60)
Glucose: 133 mg/dL — ABNORMAL HIGH (ref 74–109)
Potassium: 4.2 mmol/L (ref 3.5–5.0)
Sodium: 139 mmol/L (ref 132–146)
Total Bilirubin: 3.7 mg/dL — ABNORMAL HIGH (ref 0.0–1.2)
Total Protein: 4.4 g/dL — ABNORMAL LOW (ref 6.4–8.3)

## 2015-06-24 LAB — BLOOD GAS, ARTERIAL
B.E.: -8.2 mmol/L — ABNORMAL LOW (ref <=3.0)
COHb: 1.3 % (ref 0.0–1.5)
Date Analyzed: 20170315
Date Of Collection: 20170315
FIO2: 50 %
HCO3: 16.4 mmol/L — ABNORMAL LOW (ref 22.0–26.0)
HHb: 0.3 % (ref 0.0–5.0)
Lab: 15941
MetHb: 0.2 % (ref 0.0–1.5)
O2 Content: 13 mL/dL
O2 Sat: 99.7 % — ABNORMAL HIGH (ref 92.0–98.5)
O2Hb: 98.2 % — ABNORMAL HIGH (ref 94.0–97.0)
Operator ID: 246400
PCO2: 30.3 mmHg — ABNORMAL LOW (ref 35.0–45.0)
PO2/FIO2: 3.9 mmHg/%
PO2: 195.2 mmHg — ABNORMAL HIGH (ref 60.0–100.0)
Peep/Cpap: 5 cmH?O
Pt Temp: 37 C
RI(T): 58 %
Rr Mechanical: 16 b/min
Time Analyzed: 502
Time Collected: 501
Vt Mechanical: 500 mL
pH, Blood Gas: 7.352 (ref 7.350–7.450)
tHb (est): 9.1 g/dL — ABNORMAL LOW (ref 11.5–16.5)

## 2015-06-24 LAB — MAGNESIUM: Magnesium: 1.9 mg/dL (ref 1.6–2.6)

## 2015-06-24 LAB — CBC WITH AUTO DIFFERENTIAL
Basophils %: 0.2 % (ref 0.0–2.0)
Basophils Absolute: 0.03 E9/L (ref 0.00–0.20)
Eosinophils %: 0 % (ref 0.0–6.0)
Eosinophils Absolute: 0 E9/L — ABNORMAL LOW (ref 0.05–0.50)
Hematocrit: 26.5 % — ABNORMAL LOW (ref 37.0–54.0)
Hemoglobin: 8.7 g/dL — ABNORMAL LOW (ref 12.5–16.5)
Immature Granulocytes #: 0.3 E9/L
Immature Granulocytes %: 1.6 % (ref 0.0–5.0)
Lymphocytes %: 5.9 % — ABNORMAL LOW (ref 20.0–42.0)
Lymphocytes Absolute: 1.12 E9/L — ABNORMAL LOW (ref 1.50–4.00)
MCH: 24.3 pg — ABNORMAL LOW (ref 26.0–35.0)
MCHC: 32.8 % (ref 32.0–34.5)
MCV: 74 fL — ABNORMAL LOW (ref 80.0–99.9)
MPV: 10.4 fL (ref 7.0–12.0)
Monocytes %: 5.4 % (ref 2.0–12.0)
Monocytes Absolute: 1.01 E9/L — ABNORMAL HIGH (ref 0.10–0.95)
Neutrophils %: 86.9 % — ABNORMAL HIGH (ref 43.0–80.0)
Neutrophils Absolute: 16.39 E9/L — ABNORMAL HIGH (ref 1.80–7.30)
Platelets: 127 E9/L — ABNORMAL LOW (ref 130–450)
RBC: 3.58 E12/L — ABNORMAL LOW (ref 3.80–5.80)
RDW: 21.2 fL — ABNORMAL HIGH (ref 11.5–15.0)
WBC: 18.9 E9/L — ABNORMAL HIGH (ref 4.5–11.5)

## 2015-06-24 LAB — TRANSFUSION REACTION EVALUATION

## 2015-06-24 LAB — LACTIC ACID
Lactic Acid: 1.4 mmol/L (ref 0.5–2.2)
Lactic Acid: 1.3 mmol/L (ref 0.5–2.2)
Lactic Acid: 1.2 mmol/L (ref 0.5–2.2)

## 2015-06-24 LAB — POCT GLUCOSE
Meter Glucose: 95 mg/dL (ref 70–110)
Meter Glucose: 89 mg/dL (ref 70–110)
Meter Glucose: 125 mg/dL — ABNORMAL HIGH (ref 70–110)
Meter Glucose: 132 mg/dL — ABNORMAL HIGH (ref 70–110)
Meter Glucose: 109 mg/dL (ref 70–110)

## 2015-06-24 LAB — CULTURE, URINE

## 2015-06-24 LAB — GRAM STAIN

## 2015-06-24 LAB — PROTIME-INR
INR: 1.4
Protime: 15.9 s — ABNORMAL HIGH (ref 9.3–12.4)

## 2015-06-24 LAB — CALCIUM, IONIZED: Calcium, Ionized: 1.18 mmol/L (ref 1.15–1.33)

## 2015-06-24 LAB — CULTURE, MRSA, SCREENING

## 2015-06-24 LAB — PHOSPHORUS: Phosphorus: 4.1 mg/dL (ref 2.5–4.5)

## 2015-06-24 LAB — HEMOGLOBIN A1C: Hemoglobin A1C: 7.9 % — ABNORMAL HIGH (ref 4.8–5.9)

## 2015-06-24 MED ORDER — PROPOFOL 1000 MG/100ML IV EMUL
1000 MG/100ML | INTRAVENOUS | Status: DC
Start: 2015-06-24 — End: 2015-06-24
  Administered 2015-06-24: 12:00:00 5 ug/kg/min via INTRAVENOUS

## 2015-06-24 MED ORDER — NOREPINEPHRINE BITARTRATE 1 MG/ML IV SOLN
1 MG/ML | INTRAVENOUS | Status: AC
Start: 2015-06-24 — End: 2015-06-24

## 2015-06-24 MED ORDER — NOREPINEPHRINE BITARTRATE 1 MG/ML IV SOLN
1 MG/ML | INTRAVENOUS | Status: DC
Start: 2015-06-24 — End: 2015-06-25

## 2015-06-24 MED ORDER — IOHEXOL 240 MG/ML IJ SOLN
240 MG/ML | Freq: Once | INTRAMUSCULAR | Status: DC | PRN
Start: 2015-06-24 — End: 2015-06-25

## 2015-06-24 MED ORDER — FENTANYL CITRATE (PF) 100 MCG/2ML IJ SOLN
100 MCG/2ML | INTRAMUSCULAR | Status: DC | PRN
Start: 2015-06-24 — End: 2015-06-25
  Administered 2015-06-24: 20:00:00 25 ug via INTRAVENOUS

## 2015-06-24 MED ORDER — FENTANYL 500 MCG IN NS 100 ML INFUSION
Status: DC
Start: 2015-06-24 — End: 2015-06-24
  Administered 2015-06-24: 16:00:00 25 ug/h via INTRAVENOUS

## 2015-06-24 MED ORDER — FENTANYL CITRATE (PF) 100 MCG/2ML IJ SOLN
100 MCG/2ML | INTRAMUSCULAR | Status: DC | PRN
Start: 2015-06-24 — End: 2015-06-24

## 2015-06-24 MED ORDER — NOREPINEPHRINE BITARTRATE 1 MG/ML IV SOLN
1 MG/ML | INTRAVENOUS | Status: DC
Start: 2015-06-24 — End: 2015-06-23
  Administered 2015-06-24: 01:00:00 2 ug/min via INTRAVENOUS

## 2015-06-24 MED ORDER — MIDAZOLAM HCL 2 MG/2ML IJ SOLN
2 MG/ML | Freq: Once | INTRAMUSCULAR | Status: AC | PRN
Start: 2015-06-24 — End: 2015-06-23

## 2015-06-24 MED ORDER — FENTANYL CITRATE (PF) 100 MCG/2ML IJ SOLN
100 MCG/2ML | INTRAMUSCULAR | Status: DC | PRN
Start: 2015-06-24 — End: 2015-06-25
  Administered 2015-06-25 (×4): 50 ug via INTRAVENOUS

## 2015-06-24 MED ORDER — IOVERSOL 68 % IV SOLN
68 % | Freq: Once | INTRAVENOUS | Status: AC | PRN
Start: 2015-06-24 — End: 2015-06-24
  Administered 2015-06-24: 14:00:00 120 mL via INTRAVENOUS

## 2015-06-24 MED ORDER — CHLORHEXIDINE GLUCONATE 0.12 % MT SOLN
0.12 % | Freq: Two times a day (BID) | OROMUCOSAL | Status: DC
Start: 2015-06-24 — End: 2015-06-25
  Administered 2015-06-24: 15:00:00 15 mL via OROMUCOSAL

## 2015-06-24 MED ORDER — BLISTEX LIP BALM 2-2.5-6.6 % EX STCK
CUTANEOUS | Status: AC
Start: 2015-06-24 — End: 2015-06-24
  Administered 2015-06-24: 20:00:00

## 2015-06-24 MED ORDER — SODIUM CHLORIDE 0.9 % IV BOLUS
0.9 % | Freq: Once | INTRAVENOUS | Status: AC
Start: 2015-06-24 — End: 2015-06-23
  Administered 2015-06-24: 01:00:00 1000 mL via INTRAVENOUS

## 2015-06-24 MED ORDER — LUBRIFRESH P.M. OP OINT
OPHTHALMIC | Status: DC
Start: 2015-06-24 — End: 2015-06-25
  Administered 2015-06-24: 15:00:00 via OPHTHALMIC

## 2015-06-24 MED FILL — NORMAL SALINE FLUSH 0.9 % IV SOLN: 0.9 % | INTRAVENOUS | Qty: 40

## 2015-06-24 MED FILL — PROTONIX 40 MG IV SOLR: 40 MG | INTRAVENOUS | Qty: 40

## 2015-06-24 MED FILL — PIPERACILLIN SOD-TAZOBACTAM SO 3.375 (3-0.375) G IV SOLR: 3.375 (3-0.375) g | INTRAVENOUS | Qty: 3.38

## 2015-06-24 MED FILL — LEVOPHED 1 MG/ML IV SOLN: 1 MG/ML | INTRAVENOUS | Qty: 16

## 2015-06-24 MED FILL — FENTANYL 500 MCG IN NS 100 ML INFUSION: Qty: 100

## 2015-06-24 MED FILL — DIPRIVAN 1000 MG/100ML IV EMUL: 1000 MG/100ML | INTRAVENOUS | Qty: 100

## 2015-06-24 MED FILL — FENTANYL CITRATE (PF) 100 MCG/2ML IJ SOLN: 100 MCG/2ML | INTRAMUSCULAR | Qty: 2

## 2015-06-24 MED FILL — PURALUBE 85-15 % OP OINT: 85-15 % | OPHTHALMIC | Qty: 3.5

## 2015-06-24 MED FILL — CHLORHEXIDINE GLUCONATE 0.12 % MT SOLN: 0.12 % | OROMUCOSAL | Qty: 15

## 2015-06-24 MED FILL — BLISTEX LIP BALM 2-2.5-6.6 % EX STCK: 2-2.5-6.6 % | CUTANEOUS | Qty: 4

## 2015-06-24 NOTE — Progress Notes (Signed)
Palliative Medicine  Progress Note    Patient was seen at the bedside.  He was intubated over night.  He had the IR drainage of the liver abscess done yesterday as well.  He appears comfortable at this time, and is being weaned for extubation.  Spoke with family and provided support.  PM will continue to follow.    Hulen LusterAlan J Charlcie Prisco RN, MSN, FNP-BC  Palliative Medicine

## 2015-06-24 NOTE — Progress Notes (Signed)
Extubated to cannula.

## 2015-06-24 NOTE — Progress Notes (Signed)
Spoke with Dr. Alison Murrayansom in regards to the most recent CT scan. No signs of free air at this time. Increased amount of free fluid in pelvis is likely  Related to IR procedure done yesterday. Will continue to monitor closely at this time and work towards extubation as well.    Lossie FaesJoseph Haelie Clapp, MD

## 2015-06-24 NOTE — Progress Notes (Signed)
[facility]: 2 sets were drawn resulting in 4 bottles of gram negative baccilli and 2 bottles of gram positive baccilli. Said results were relayed to Dr. Carmelina NounMiladore .

## 2015-06-24 NOTE — Progress Notes (Signed)
TRANSFUSION REACTION REPORT    70 y.o. year old male [facility] with the admitting diagnosis of  Evaluation for septic shock secondary to liver abscess and possible right sided ischemic colitis. [facility] after being found hypotensive though asymptomatic at home by a home health nurse. Patient has a significant history of pancreatic cancer with attempted Whipple 12/2014 by Dr. Lowella BandyKen Lee which was aborted due to vascular invasion with subsequent palliative hepaticoduodenostomy. He  has a past medical history of Liver abscess and Primary pancreatic cancer  (HCC).  He was receiving Packed RBC component transfusion because of symptoms of anemia when he experienced decreased Oxygen saturation and respiratory failure during transfusion. All vital signs stable and unchanged pre- to post-transfusion. No infiltrates noted on chest x-ray. No pulmonary parenchymal abnormalities are seen on imaging studies.    Transfusion stopped. Transfusion reaction work-up ordered. Blood Bank work-up performed. Clerical check=O.K.; Hemolysis check=negative; ABO type=O.K.; Direct antiglobulin test=negative. No evidence of hemolysis or immunologic hemolysis noted.     Negative for transfusion reaction. SYMPTOMS UNRELATED TO TRANSFUSION. Symptoms related to patient???s underlying clinical condition (sepsis, liver abscess, among others).

## 2015-06-24 NOTE — Progress Notes (Signed)
Patient extubated and currently SpO2 96 % on nasal cannula. Appears to be in no acute distress at this time.    Lossie FaesJoseph Damyon Mullane, MD

## 2015-06-24 NOTE — Other (Signed)
Patient at CT

## 2015-06-24 NOTE — Other (Signed)
Patient continues to pull at lines and tubes critical to his care. Verbal redirection unsuccessful at this time. Bilateral soft wrist restraints in place for patient's safety.  Katheran AweVanessa D Kemper Heupel 06/24/2015 12:20 AM

## 2015-06-24 NOTE — Plan of Care (Signed)
Problem: Falls - Risk of  Goal: Absence of falls  Outcome: Met This Shift    Problem: Risk for Impaired Skin Integrity  Goal: Tissue integrity - skin and mucous membranes  Structural intactness and normal physiological function of skin and  mucous membranes.   Outcome: Met This Shift    Problem: Restraint Use - Nonviolent/Non-Self-Destructive Behavior:  Goal: Absence of restraint indications  Absence of restraint indications   Outcome: Not Met This Shift  Goal: Absence of restraint-related injury  Absence of restraint-related injury   Outcome: Met This Shift

## 2015-06-24 NOTE — Progress Notes (Signed)
Surgical Intensive Care Unit  Daily Progress Note    Date of admission:  06/22/2015    Reason for ICU transfer:  Intra-abdominal sepsis    Subjective:  PAtient is intubated this AM but appears alert. Shakes head no to abdominal pain.        Physical Exam:  Visit Vitals   . BP 122/73   . Pulse 66   . Temp 97.8 F (36.6 C) (Axillary)   . Resp 22   . Ht 6' (1.829 m)   . Wt 193 lb (87.5 kg)   . SpO2 100%   . BMI 26.18 kg/m2     CONSTITUTIONAL: intubated, appears alert,   LUNGS:  Intubated, non-labored respirations  CARDIOVASCULAR:  Regular rate  ABDOMEN: soft, minimally distended, IR drain in place, with bilious output      ASSESSMENT / PLAN:   Neuro: Currently on Fent and prop for sedation, will D/C when attempting to extubate    CV: Septic shock, wean levophed as able, continue IVF resuscitation as needed.   Pulm: Intubated, switched from A/C to pressure support at this time. Will attempt extubation this afternoon. Bilateral pleural effusions likely secondary to hypervolemia from resuscitation yesterday   GI: Hx pancreatic cancer s/p aborted Whipple 2/2 vascular invasion, [facility] (IR drain placed 3/14) with portal venous vs portal gas, right sided colitis w concern for ischemic infarct and intramural air. CT scan done today showing increase amount of free fluid in plevis (likely secondary to IR drain placed yesterday), with no signs of free air.    Renal:  Positive fluid balance, will attempt to diurese when levophed weaned off.    ID: Improving leukocytosis, continue ABX at this time, F/U on pan Cxs    Endo: Mild hyperglycemia. Monitor glucose   MSK: Diffusely jaundiced 2/2 hyperbilirubinemia. No other issues.   Heme: Monitor H and H.        Bowel regimen: MoM  DVT proph:  SCDs  GI proph:  pepcid   Glucose protocol: SSI  Mouth/eye care: Per patient  Foley:   , keep in place for critical care monitoring of fluid balance.  CVC sites:  R  IJ TLC, R radial art line   Ancillary consults: Hem/onc  Family Update: Updated last evening      Dispo: Attempt extubation today.     Lossie FaesJoseph Aysha Livecchi, MD

## 2015-06-24 NOTE — Progress Notes (Signed)
Wean parameter done    VT= 463 mls  F=22 B/M  V= 9.23 l/m  NIF=  -24 cmH2O  VC= .869 L  RSBI = 50

## 2015-06-24 NOTE — Plan of Care (Signed)
Problem: Falls - Risk of  Goal: Absence of falls  Outcome: Met This Shift    Problem: Risk for Impaired Skin Integrity  Goal: Tissue integrity - skin and mucous membranes  Structural intactness and normal physiological function of skin and  mucous membranes.   Outcome: Met This Shift

## 2015-06-24 NOTE — Progress Notes (Signed)
Critical Care Attending Note  ????  ICU Adm Date: 06/22/15  ????  Diagnosis: Colitis   ????  Surgical Procedures: none  ????  Overnight Events: stable  ????  Physical Exam:   Visit Vitals   ??? BP 122/73   ??? Pulse 76   ??? Temp 97.4 ??F (36.3 ??C) (Axillary)   ??? Resp 21   ??? Ht 6' (1.829 m)   ??? Wt 193 lb (87.5 kg)   ??? SpO2 100%   ??? BMI 26.18 kg/m2     Alert and oriented, purposeful  Unlabored respirations with bilateral breath sounds  Regular rhythm and normal sounds  Flat soft, non-tender abdomen.  ????  Problems and Plan:   Colitis on CT scan. No sign of perforation or ischemia. WBC to 19k and Lactate to 1.4, will monitor and consider exploratory laparotomy if patient not improve with hydration.  Respiratory failure on ventilator of AC with PEEP-5 and p/f-385. Attempt to extubate today.  Liver abscess, IR drain today. On Zosyn.  ????  Attestation: Reviewed Surgical Residents documentation and agree with the evaluation and plan.   ????  Critical Care: 35 minutes evaluating patient, reviewing chart and managing patient with potentially life threatening condition at risk for deterioration and even death.

## 2015-06-24 NOTE — Other (Signed)
Patient continues pull at ETT tube when restraints are removed for assessment. RN is unable to redirect patient to cease such behavior at this time.

## 2015-06-24 NOTE — Plan of Care (Signed)
Problem: Restraint Use - Nonviolent/Non-Self-Destructive Behavior:  Goal: Absence of restraint indications  Absence of restraint indications   Outcome: Not Met This Shift  Goal: Absence of restraint-related injury  Absence of restraint-related injury   Outcome: Met This Shift

## 2015-06-25 ENCOUNTER — Other Ambulatory Visit: Payer: Self-pay | Admitting: Internal Medicine

## 2015-06-25 ENCOUNTER — Encounter: Payer: Self-pay | Admitting: Internal Medicine

## 2015-06-25 ENCOUNTER — Inpatient Hospital Stay: Admit: 2015-06-25

## 2015-06-25 LAB — COMPREHENSIVE METABOLIC PANEL
ALT: 30 U/L (ref 0–40)
AST: 94 U/L — ABNORMAL HIGH (ref 0–39)
Albumin: 1.3 g/dL — ABNORMAL LOW (ref 3.5–5.2)
Alkaline Phosphatase: 376 U/L — ABNORMAL HIGH (ref 40–129)
Anion Gap: 12 mmol/L (ref 7–16)
BUN: 14 mg/dL (ref 8–23)
CO2: 18 mmol/L — ABNORMAL LOW (ref 22–29)
Calcium: 7.8 mg/dL — ABNORMAL LOW (ref 8.6–10.2)
Chloride: 109 mmol/L — ABNORMAL HIGH (ref 98–107)
Creatinine: 0.7 mg/dL (ref 0.7–1.2)
GFR African American: >60
GFR Non-African American: >60 mL/min/{1.73_m2} (ref >=60)
Glucose: 137 mg/dL — ABNORMAL HIGH (ref 74–109)
Potassium: 3.5 mmol/L (ref 3.5–5.0)
Sodium: 139 mmol/L (ref 132–146)
Total Bilirubin: 3.9 mg/dL — ABNORMAL HIGH (ref 0.0–1.2)
Total Protein: 4.5 g/dL — ABNORMAL LOW (ref 6.4–8.3)

## 2015-06-25 LAB — CBC WITH AUTO DIFFERENTIAL
Basophils %: 0.2 % (ref 0.0–2.0)
Basophils Absolute: 0 E9/L (ref 0.00–0.20)
Eosinophils %: 0.1 % (ref 0.0–6.0)
Eosinophils Absolute: 0 E9/L — ABNORMAL LOW (ref 0.05–0.50)
Hematocrit: 24.4 % — ABNORMAL LOW (ref 37.0–54.0)
Hemoglobin: 8.2 g/dL — ABNORMAL LOW (ref 12.5–16.5)
Lymphocytes %: 2.6 % — ABNORMAL LOW (ref 20.0–42.0)
Lymphocytes Absolute: 0.48 E9/L — ABNORMAL LOW (ref 1.50–4.00)
MCH: 24.4 pg — ABNORMAL LOW (ref 26.0–35.0)
MCHC: 33.6 % (ref 32.0–34.5)
MCV: 72.6 fL — ABNORMAL LOW (ref 80.0–99.9)
MPV: 10.2 fL (ref 7.0–12.0)
Monocytes %: 2.6 % (ref 2.0–12.0)
Monocytes Absolute: 0.48 E9/L (ref 0.10–0.95)
Neutrophils %: 94.8 % — ABNORMAL HIGH (ref 43.0–80.0)
Neutrophils Absolute: 15.2 E9/L — ABNORMAL HIGH (ref 1.80–7.30)
Platelets: 104 E9/L — ABNORMAL LOW (ref 130–450)
RBC: 3.36 E12/L — ABNORMAL LOW (ref 3.80–5.80)
RDW: 21.3 fL — ABNORMAL HIGH (ref 11.5–15.0)
WBC: 16 E9/L — ABNORMAL HIGH (ref 4.5–11.5)
nRBC: 0.9 /100 WBC

## 2015-06-25 LAB — POCT GLUCOSE
Meter Glucose: 136 mg/dL — ABNORMAL HIGH (ref 70–110)
Meter Glucose: 134 mg/dL — ABNORMAL HIGH (ref 70–110)
Meter Glucose: 124 mg/dL — ABNORMAL HIGH (ref 70–110)
Meter Glucose: 124 mg/dL — ABNORMAL HIGH (ref 70–110)
Meter Glucose: 133 mg/dL — ABNORMAL HIGH (ref 70–110)
Meter Glucose: 96 mg/dL (ref 70–110)

## 2015-06-25 LAB — TYPE AND SCREEN
ABO/Rh: O POS
Antibody Screen: NEGATIVE

## 2015-06-25 LAB — MAGNESIUM: Magnesium: 1.8 mg/dL (ref 1.6–2.6)

## 2015-06-25 LAB — LACTIC ACID
Lactic Acid: 0.9 mmol/L (ref 0.5–2.2)
Lactic Acid: 0.8 mmol/L (ref 0.5–2.2)

## 2015-06-25 LAB — CULTURE, ANAEROBIC

## 2015-06-25 LAB — CULTURE, URINE

## 2015-06-25 LAB — PHOSPHORUS: Phosphorus: 3 mg/dL (ref 2.5–4.5)

## 2015-06-25 LAB — CALCIUM, IONIZED: Calcium, Ionized: 1.21 mmol/L (ref 1.15–1.33)

## 2015-06-25 MED ORDER — PIPERACILLIN SOD-TAZOBACTAM SO 3.375 (3-0.375) G IV SOLR
3.3753-0.375 (3-0.375) g | Freq: Three times a day (TID) | INTRAVENOUS | Status: DC
Start: 2015-06-25 — End: 2015-06-29
  Administered 2015-06-26 – 2015-06-29 (×10): 3.375 g via INTRAVENOUS

## 2015-06-25 MED ORDER — VENLAFAXINE HCL 75 MG PO TABS
75 MG | Freq: Every day | ORAL | Status: DC
Start: 2015-06-25 — End: 2015-06-29
  Administered 2015-06-25 – 2015-06-29 (×5): 37.5 mg via ORAL

## 2015-06-25 MED ORDER — MORPHINE SULFATE (PF) 2 MG/ML IV SOLN
2 MG/ML | INTRAVENOUS | Status: DC | PRN
Start: 2015-06-25 — End: 2015-06-25
  Administered 2015-06-25: 18:00:00 2 mg via INTRAVENOUS

## 2015-06-25 MED ORDER — OXYCODONE HCL 5 MG PO TABS
5 MG | ORAL | Status: DC | PRN
Start: 2015-06-25 — End: 2015-06-28
  Administered 2015-06-26 – 2015-06-27 (×4): 5 mg via ORAL
  Administered 2015-06-27: 17:00:00 2.5 mg via ORAL
  Administered 2015-06-27: 06:00:00 5 mg via ORAL
  Administered 2015-06-28: 02:00:00 2.5 mg via ORAL
  Administered 2015-06-28: 08:00:00 5 mg via ORAL

## 2015-06-25 MED ORDER — MORPHINE SULFATE (PF) 2 MG/ML IV SOLN
2 MG/ML | INTRAVENOUS | Status: DC | PRN
Start: 2015-06-25 — End: 2015-06-26

## 2015-06-25 MED ORDER — MAGNESIUM SULFATE 2000 MG/50 ML IVPB PREMIX
2 GM/50ML | Freq: Once | INTRAVENOUS | Status: AC
Start: 2015-06-25 — End: 2015-06-25
  Administered 2015-06-25: 19:00:00 2 g via INTRAVENOUS

## 2015-06-25 MED ORDER — MORPHINE SULFATE (PF) 2 MG/ML IV SOLN
2 MG/ML | INTRAVENOUS | Status: AC
Start: 2015-06-25 — End: 2015-06-25
  Administered 2015-06-25: 18:00:00 2 via INTRAVENOUS

## 2015-06-25 MED ORDER — MORPHINE SULFATE (PF) 4 MG/ML IV SOLN
4 MG/ML | INTRAVENOUS | Status: DC | PRN
Start: 2015-06-25 — End: 2015-06-26
  Administered 2015-06-25 (×2): 4 mg via INTRAVENOUS

## 2015-06-25 MED ORDER — DEXTROSE 5 % IV SOLN
5 % | INTRAVENOUS | Status: DC
Start: 2015-06-25 — End: 2015-06-25

## 2015-06-25 MED ORDER — CEFTRIAXONE SODIUM 2 G IJ SOLR
2 g | INTRAMUSCULAR | Status: DC
Start: 2015-06-25 — End: 2015-06-25
  Administered 2015-06-25: 18:00:00 2 g via INTRAVENOUS

## 2015-06-25 MED ORDER — MORPHINE SULFATE (PF) 4 MG/ML IV SOLN
4 MG/ML | INTRAVENOUS | Status: DC | PRN
Start: 2015-06-25 — End: 2015-06-25

## 2015-06-25 MED ORDER — HEPARIN SODIUM (PORCINE) 10000 UNIT/ML IJ SOLN
10000 UNIT/ML | Freq: Three times a day (TID) | INTRAMUSCULAR | Status: AC
Start: 2015-06-25 — End: 2015-06-27
  Administered 2015-06-26 – 2015-06-27 (×5): 5000 [IU] via SUBCUTANEOUS

## 2015-06-25 MED ORDER — POTASSIUM CHLORIDE 20 MEQ/50ML IV SOLN
20 MEQ/50ML | INTRAVENOUS | Status: AC
Start: 2015-06-25 — End: 2015-06-25
  Administered 2015-06-25 (×3): 20 meq via INTRAVENOUS

## 2015-06-25 MED ORDER — PANTOPRAZOLE SODIUM 40 MG PO TBEC
40 MG | Freq: Every day | ORAL | Status: DC
Start: 2015-06-25 — End: 2015-06-29
  Administered 2015-06-26 – 2015-06-29 (×4): 40 mg via ORAL

## 2015-06-25 MED ORDER — GLIMEPIRIDE 4 MG PO TABS: ORAL_TABLET | ORAL | Status: DC

## 2015-06-25 MED ORDER — ATORVASTATIN CALCIUM 80 MG PO TABS: 40.0000 mg | ORAL_TABLET | Freq: Every day | ORAL | Status: DC

## 2015-06-25 MED FILL — NORMAL SALINE FLUSH 0.9 % IV SOLN: 0.9 % | INTRAVENOUS | Qty: 10

## 2015-06-25 MED FILL — MORPHINE SULFATE (PF) 4 MG/ML IV SOLN: 4 mg/mL | INTRAVENOUS | Qty: 1

## 2015-06-25 MED FILL — PROTONIX 40 MG IV SOLR: 40 MG | INTRAVENOUS | Qty: 40

## 2015-06-25 MED FILL — HEPARIN SODIUM (PORCINE) 10000 UNIT/ML IJ SOLN: 10000 UNIT/ML | INTRAMUSCULAR | Qty: 1

## 2015-06-25 MED FILL — LEVOPHED 1 MG/ML IV SOLN: 1 MG/ML | INTRAVENOUS | Qty: 16

## 2015-06-25 MED FILL — MORPHINE SULFATE (PF) 2 MG/ML IV SOLN: 2 mg/mL | INTRAVENOUS | Qty: 1

## 2015-06-25 MED FILL — NORMAL SALINE FLUSH 0.9 % IV SOLN: 0.9 % | INTRAVENOUS | Qty: 30

## 2015-06-25 MED FILL — MAGNESIUM SULFATE 2 GM/50ML IV SOLN: 2 GM/50ML | INTRAVENOUS | Qty: 50

## 2015-06-25 MED FILL — POTASSIUM CHLORIDE 20 MEQ/50ML IV SOLN: 20 MEQ/50ML | INTRAVENOUS | Qty: 50

## 2015-06-25 MED FILL — FENTANYL CITRATE (PF) 100 MCG/2ML IJ SOLN: 100 MCG/2ML | INTRAMUSCULAR | Qty: 2

## 2015-06-25 MED FILL — CEFTRIAXONE SODIUM 2 G IJ SOLR: 2 g | INTRAMUSCULAR | Qty: 2

## 2015-06-25 MED FILL — VENLAFAXINE HCL 75 MG PO TABS: 75 MG | ORAL | Qty: 1

## 2015-06-25 MED FILL — PIPERACILLIN SOD-TAZOBACTAM SO 3.375 (3-0.375) G IV SOLR: 3.375 (3-0.375) g | INTRAVENOUS | Qty: 3.38

## 2015-06-25 NOTE — Progress Notes (Signed)
PT transported up to PCCU with RN and transport on monitor. Pt belongings sent with pt and family notified. Pt tolerated well.

## 2015-06-25 NOTE — Progress Notes (Signed)
Palliative Medicine  Progress Note    Patient seen at the bedside, no family present.  He is now extubated, awake, alert and oriented.  He states that he feels better today.  Support was provided.  PM will follow along.    Hulen LusterAlan J Shelonda Saxe RN, MSN, FNP-BC  Palliative Medicine

## 2015-06-25 NOTE — Progress Notes (Signed)
ID consulted on this pt @ 1327 pm.

## 2015-06-25 NOTE — Plan of Care (Signed)
Problem: Falls - Risk of  Goal: Absence of falls  Outcome: Met This Shift    Problem: Risk for Impaired Skin Integrity  Goal: Tissue integrity - skin and mucous membranes  Structural intactness and normal physiological function of skin and  mucous membranes.   Outcome: Met This Shift    Problem: Pain:  Goal: Pain level will decrease  Pain level will decrease   Outcome: Met This Shift

## 2015-06-25 NOTE — Progress Notes (Signed)
Critical Care Attending Note  ????  ICU Adm Date: 06/22/15  ????  Diagnosis: Colitis   ????  Surgical Procedures: none  ????  Overnight Events: extubated, doing well  ????  Physical Exam:   Visit Vitals   ??? BP 122/73   ??? Pulse 76   ??? Temp 98.2 ??F (36.8 ??C) (Oral)   ??? Resp 21   ??? Ht 6' (1.829 m)   ??? Wt 213 lb 13.5 oz (97 kg)   ??? SpO2 99%   ??? BMI 29 kg/m2     Alert and oriented, purposeful  Unlabored respirations with bilateral breath sounds  Regular rhythm and normal sounds  Flat soft, non-tender abdomen.  ????  Problems and Plan:   Colitis on CT scan. No sign of perforation or ischemia. WBC to 16k and Lactate to 0.8, improved with hydration.  Respiratory failure, extubated yesterday.  Liver abscess, IR drain today. On Zosyn.  ????  Attestation: Reviewed Surgical Residents documentation and agree with the evaluation and plan.   ????  Critical Care: 25 minutes evaluating patient, reviewing chart and managing patient

## 2015-06-25 NOTE — Plan of Care (Signed)
Problem: Falls - Risk of  Goal: Absence of falls  Outcome: Met This Shift    Problem: Risk for Impaired Skin Integrity  Goal: Tissue integrity - skin and mucous membranes  Structural intactness and normal physiological function of skin and  mucous membranes.   Outcome: Met This Shift

## 2015-06-25 NOTE — Consults (Signed)
INFECTIOUS DISEASE CONSULT NOTE  [facility]    Patient:  Arthur Hunt 70 y.o. male   MRN: [id number]       Date of Service: 06/25/2015      Chief Complaint:    History of Present Illness   The patient is a 70 y.o. male with a history of pancreatic cancer. His oncologist and surgeon is a part of [facility]. Ascending me that in January he was treated with IV antibiotic for about 2 weeks. He is not sure what infection he has.  He has a visiting nurse noticed that his blood pressure is low and she instructed him to come to the ER. He was not having any abdominal pain. No fevers and no chills. He just lost his energy. With his pancreatic cancer a trial of Whipple's procedure was entertained but it could not be done. As per my discussion with the surgical team his biliary system was connected to the his duodenum. He was very ill with a very low blood pressure and he was having fever. He had a CAT scan done which showed liver abscess that was drained and culture growing E. coli although Gram stain showed gram-negative rods and gram-positive cocci in chains. He received meropenem, Zosyn,  and his antibiotic was changed today to ceftriaxone. He is having some chest pain in the right side whenever he coughs. The pain is not severe. No radiation. Currently no associated fever. It is sharp in nature.    I reviewed all systems and other than what I mentioned above everything is negative.        Past Medical History:      Diagnosis Date   ??? Liver abscess      right lobe of liver   ??? Primary pancreatic cancer  [facility](HCC)        Past Surgical History:        Procedure Laterality Date   ??? Biliopancreatic div w duodenal       hepatoduodenostomy       Medications Prior to Admission:    Prior to Admission medications    Medication Sig Start Date End Date Taking? Authorizing Provider   Pancrelipase, Lip-Prot-Amyl, 36000 UNITS CPEP Take 48,000 Units by mouth 3 times daily (before meals)   Yes Historical Provider, MD   Insulin Regular Human (NOVOLIN R IJ)  Inject 1 Units as directed three times daily   Yes Historical Provider, MD   lisinopril (PRINIVIL;ZESTRIL) 30 MG tablet Take 30 mg by mouth daily   Yes Historical Provider, MD   magnesium oxide (MAG-OX) 400 MG tablet Take 400 mg by mouth daily   Yes Historical Provider, MD   metFORMIN (GLUCOPHAGE) 500 MG tablet Take 500 mg by mouth 2 times daily (with meals)   Yes Historical Provider, MD   ondansetron (ZOFRAN) 8 MG tablet Take 8 mg by mouth every 8 hours as needed for Nausea or Vomiting   Yes Historical Provider, MD   pantoprazole (PROTONIX) 20 MG tablet Take 20 mg by mouth daily   Yes Historical Provider, MD   prochlorperazine (COMPAZINE) 10 MG tablet Take 10 mg by mouth 3 times daily as needed (for nausea and vomiting)   Yes Historical Provider, MD   rivaroxaban (XARELTO) 20 MG TABS tablet Take 20 mg by mouth nightly   Yes Historical Provider, MD   venlafaxine (EFFEXOR) 37.5 MG tablet Take 37.5 mg by mouth daily   Yes Historical Provider, MD       Current Medications:  Current Facility-Administered Medications:   ???  magnesium sulfate 2 g in 50 mL IVPB premix, 2 g, Intravenous, Once, Inda Castle, MD, Last Rate: 25 mL/hr at 06/25/15 1523, 2 g at 06/25/15 1523  ???  [START ON 06/26/2015] pantoprazole (PROTONIX) tablet 40 mg, 40 mg, Oral, QAM AC, Inda Castle, MD  ???  venlafaxine (EFFEXOR) tablet 37.5 mg, 37.5 mg, Oral, Daily, Inda Castle, MD  ???  heparin (porcine) injection 5,000 Units, 5,000 Units, Subcutaneous, 3 times per day, Inda Castle, MD, 5,000 Units at 06/25/15 1521  ???  morphine (PF) injection 2 mg, 2 mg, Intravenous, Q3H PRN **OR** morphine (PF) injection 4 mg, 4 mg, Intravenous, Q3H PRN, Inda Castle, MD, 4 mg at 06/25/15 1600  ???  [START ON 06/26/2015] piperacillin-tazobactam (ZOSYN) 3.375 g in dextrose 5 % 50 mL IVPB extended infusion (mini-bag), 3.375 g, Intravenous, Q8H, Oliver Hum, MD  ???  albuterol (PROVENTIL) nebulizer solution 2.5 mg, 2.5 mg, Nebulization, 4x daily,  Megan A. Zimmerly, DO, 2.5 mg at 06/25/15 1707  ???  sodium chloride flush 0.9 % injection 10 mL, 10 mL, Intravenous, 2 times per day, Megan A. Zimmerly, DO, 10 mL at 06/25/15 0741  ???  sodium chloride flush 0.9 % injection 10 mL, 10 mL, Intravenous, PRN, Megan A. Zimmerly, DO, 10 mL at 06/25/15 1600  ???  acetaminophen (TYLENOL) tablet 650 mg, 650 mg, Oral, Q4H PRN, Megan A. Zimmerly, DO  ???  magnesium hydroxide (MILK OF MAGNESIA) 400 MG/5ML suspension 30 mL, 30 mL, Oral, Daily PRN, Megan A. Zimmerly, DO  ???  ondansetron (ZOFRAN) injection 4 mg, 4 mg, Intravenous, Q6H PRN, Megan A. Zimmerly, DO    Allergies:  Adhesive tape    Social History:   Social History     Social History   ??? Marital status: Married     Spouse name: N/A   ??? Number of children: N/A   ??? Years of education: N/A     Occupational History   ??? Not on file.     Social History Main Topics   ??? Smoking status: Former Smoker     Types: Cigarettes     Quit date: 06/21/1985   ??? Smokeless tobacco: Not on file   ??? Alcohol use No   ??? Drug use: Not on file   ??? Sexual activity: Not on file     Other Topics Concern   ??? Not on file     Social History Narrative   ??? No narrative on file       Family History:   No family history on file.        Physical Exam       VS:   Visit Vitals   ??? BP (!) 146/73   ??? Pulse 90   ??? Temp 97.5 ??F (36.4 ??C) (Oral)   ??? Resp 16   ??? Ht 6' (1.829 m)   ??? Wt 213 lb 13.5 oz (97 kg)   ??? SpO2 99%   ??? BMI 29 kg/m2       General Appearance:    Alert, cooperative, no acute distress.   Head:    Normocephalic, without obvious abnormality, atraumatic   Eyes:    , conjunctiva/corneas clear, EOM's intact, slightly icterus.    No subconjunctival hemorrhage.    Ears:    No obvious deformity or drainage.   Nose:   Nares normal, septum midline, mucosa normal, no drainage or       sinus  tenderness   Throat:   Lips, mucosa, and tongue normal; teeth and gums normal   Neck:   Supple, symmetrical, trachea midline, no adenopathy;        thyroid:  No  enlargement/tenderness/nodules;   Resp:     full & symmetric excursion Clear to auscultation bilaterally,          respirations unlabored   Chest wall:    No tenderness or deformity   Heart:    Regular rate and rhythm, S1 and S2 normal, no murmur, rub or   gallop. some edema   Abdomen:     Soft, tender right upper quadrant where he had the drain (small amount of bloody fluid in the bag), non distended    no masses, no organomegaly   Genitalia:    normal to inspection.    Rectal:    Deferred   Extremities:   Extremities normal, atraumatic, no cyanosis or edema       Skin:   Skin color, texture, turgor normal, no rashes or lesions, No Janeway Lesions or Osler's nodes.    Lymph nodes:   Cervical, supraclavicular nodes normal   Neurologic:   Alert&Oriented. No hyperreflexia. sensation intact  He is cooperative. Normal judgment.         Labs     CBC:   Lab Results   Component Value Date    WBC 16.0 06/25/2015    RBC 3.36 06/25/2015    HGB 8.2 06/25/2015    HCT 24.4 06/25/2015    PLT 104 06/25/2015    MCV 72.6 06/25/2015     BMP:    Lab Results   Component Value Date    NA 139 06/25/2015    K 3.5 06/25/2015    CL 109 06/25/2015    CO2 18 06/25/2015    BUN 14 06/25/2015    CREATININE 0.7 06/25/2015    GLUCOSE 137 06/25/2015    CALCIUM 7.8 06/25/2015     Hepatic Function Panel:    Lab Results   Component Value Date    ALKPHOS 376 06/25/2015    AST 94 06/25/2015    ALT 30 06/25/2015    PROT 4.5 06/25/2015    LABALBU 1.3 06/25/2015    BILITOT 3.9 06/25/2015     Magnesium:    Lab Results   Component Value Date    MG 1.8 06/25/2015     U/A:   Lab Results   Component Value Date    LEUKOCYTESUR Negative 06/22/2015    PHUR 5.5 06/22/2015    WBCUA NONE 06/22/2015    RBCUA 1-3 06/22/2015    BACTERIA RARE 06/22/2015    SPECGRAV <=1.005 06/22/2015    BLOODU TRACE 06/22/2015    GLUCOSEU Negative 06/22/2015     ABG:    Lab Results   Component Value Date    PH 7.352 06/24/2015    PCO2 30.3 06/24/2015    PO2 195.2 06/24/2015    O2SAT 99.7  06/24/2015    HCO3 16.4 06/24/2015    BE -8.2 06/24/2015       Notable Cultures:    Recent Labs      06/23/15   0026   BC  24 Hours- no growth     Recent Labs      06/23/15   0033   BLOODCULT2  24 Hours- no growth     Recent Labs      06/23/15   1616   ORG  Escherichia coli*  Recent Labs      06/22/15   2330   LABURIN  Growth not present     No results for input(s): CULTRESP in the last 72 hours.  No results for input(s): LEGUR in the last 72 hours.  Invalid input(s): STREPPNEUMAGU  Recent Labs      06/23/15   1616   LABANAE  Anaerobes not isolated     Recent Labs      06/23/15   1616   LABGRAM  Refer to ordered Gram stain for results     No results for input(s): CXSURG in the last 72 hours.  Recent Labs      06/23/15   1616   BFCS  Growth present, evaluating for:  Gram positive cocci  *   Moderate growth  Identification and sensitivity to follow       No results for input(s): CDIFFTOXAB in the last 72 hours.     06/25/2015 10:13 AM - Edi, [facility] Incoming Lab Results   ??   Component Results    ?? Component   ?? Body Fluid Culture, Sterile (Abnormal)   ?? Growth present, evaluating for:   Gram positive cocci      ?? Gram Stain Result   ?? Refer to ordered Gram stain for results   ?? Organism (Abnormal)   ?? Escherichia coli   ?? Body Fluid Culture, Sterile   ?? Moderate growth   Identification and sensitivity to follow      ??   Narrative    ?? Source: FLUID ?? ?? ?? Site: liver abscess ?? ?? ?? ?? ??   ??   Culture & Susceptibility    ?? ESCHERICHIA COLI    ?? Antibiotic Interpretation MIC Unit   ?? Confirmatory Extended Spectrum Beta-Lactamase  Neg mcg/mL   ?? amoxicillin-clavulanate Intermediate =^16 mcg/mL   ?? ampicillin Resistant >=^32 mcg/mL   ?? ceFAZolin Sensitive <=^4 mcg/mL   ?? cefTRIAXone Sensitive <=^1 mcg/mL   ?? cefepime Sensitive <=^1 mcg/mL   ?? gentamicin Sensitive <=^1 mcg/mL   ?? imipenem Sensitive <=^0.25 mcg/mL   ?? levofloxacin Sensitive =^1 mcg/mL   ?? piperacillin-tazobactam Sensitive <=^4 mcg/mL   ??  trimethoprim-sulfamethoxazole Sensitive <=^20 mcg/mL   ??    ??      ??   Lab and Collection        Vancomycin:  Peak:  No components found for: VANCOPOST   Trough:  No components found for: VANCOPRE  Random:  No components found for: [facility]    Imaging Studies:  Radiology:   XR Chest Portable   Final Result   1. Stable, enlarged cardiomediastinal silhouette..   2. Bibasilar infiltrate/pneumonia with bilateral pleural effusions,   right greater than left.   3. Underlying mild to moderate pulmonary edema.   4. Interval removal of the endotracheal tube and NG tube         CT ABDOMEN PELVIS W IV CONTRAST Additional Contrast? Oral   Final Result      Interval improvement of the right hepatic abscess following catheter   drainage placement.      Interval development of distended small bowel which could represent   early ileus.      No evidence of pneumatosis intestinalis or bowel perforation.      Interval increase of ascites and bilateral pleural effusion with   adjacent atelectasis.         XR Chest Portable   Final Result      No significant  interval change.         XR Chest Abdomen Ng Placement   Final Result   Satisfactory position of nasogastric/orogastric tube.         XR Chest Portable   Final Result   1. Satisfactory position of endotracheal tube.      2. Mild cardiomegaly with pulmonary vascular congestion.      3. Bibasilar atelectasis and small pleural fluid collections.         XR Chest Portable   Final Result      1. Mild cardiomegaly with mild perihilar pulmonary vascular   congestion.       2. Trace pleural fluid collection.       3. Mild bibasilar atelectasis.         CT Abscess Fluid Drain Perit / Retroperi   Final Result   Technically successful CT-guided liver abscess drainage. Approximately   20 cc's of foul-smelling purulent fluid was aspirated and sent for the   requested testing. Daily drainage catheter output will guide the   decision as to how long to leave this drainage catheter in place.          XR Chest Portable   Final Result   1. Satisfactory position of right IJ catheter without pneumothorax.         XR Chest Portable   Final Result   No airspace opacities or pleural effusion.         CT Abscess Cathether Follow Up    (Results Pending)   XR Chest Portable    (Results Pending)       Assessment and PLan     1. Septic shock: Improved. He is off pressors now.    2. Liver abscess: Culture with E. Coli, although the Gram stain showing some gram-positive cocci in chains and recently the culture was updated and there is some gram-positive cocci growing. His gram-positive cocci can be strep or enterococcus. For now I would put him back on Zosyn until we have our final culture results. When the drain was placed a 20 mL of foul-smelling purulent fluid was aspirated. Probably there is a component of an anaerobic infection. The E. coli is sensitive to Levaquin.    3. E. coli asymptomatic bacteriuria    4. Colitis: He is not behaving like C. diff colitis.? Ischemic colitis      Plan: Change ceftriaxone to Zosyn. Follow-up final culture results. Discussed with the surgical team.      Debbe Bales.D.

## 2015-06-25 NOTE — Plan of Care (Signed)
Problem: Aspiration:  Goal: Absence of aspiration  Absence of aspiration  Outcome: Met This Shift    Problem: Pain:  Goal: Recognizes and communicates pain  Recognizes and communicates pain  Outcome: Met This Shift  Goal: Control of acute pain  Control of acute pain  Outcome: Met This Shift    Problem: Skin Integrity - Impaired:  Goal: Will show no infection signs and symptoms  Will show no infection signs and symptoms  Outcome: Met This Shift

## 2015-06-26 LAB — CBC WITH AUTO DIFFERENTIAL
Basophils %: 0 % (ref 0.0–2.0)
Basophils Absolute: 0 E9/L (ref 0.00–0.20)
Eosinophils %: 0 % (ref 0.0–6.0)
Eosinophils Absolute: 0 E9/L — ABNORMAL LOW (ref 0.05–0.50)
Hematocrit: 25.8 % — ABNORMAL LOW (ref 37.0–54.0)
Hemoglobin: 8.4 g/dL — ABNORMAL LOW (ref 12.5–16.5)
Lymphocytes %: 7 % — ABNORMAL LOW (ref 20.0–42.0)
Lymphocytes Absolute: 1.01 E9/L — ABNORMAL LOW (ref 1.50–4.00)
MCH: 24 pg — ABNORMAL LOW (ref 26.0–35.0)
MCHC: 32.6 % (ref 32.0–34.5)
MCV: 73.7 fL — ABNORMAL LOW (ref 80.0–99.9)
MPV: 10 fL (ref 7.0–12.0)
Monocytes %: 6 % (ref 2.0–12.0)
Monocytes Absolute: 0.86 E9/L (ref 0.10–0.95)
Myelocyte Percent: 1 % (ref 0–0)
Neutrophils %: 86 % — ABNORMAL HIGH (ref 43.0–80.0)
Neutrophils Absolute: 12.53 E9/L — ABNORMAL HIGH (ref 1.80–7.30)
Platelets: 143 E9/L (ref 130–450)
RBC: 3.5 E12/L — ABNORMAL LOW (ref 3.80–5.80)
RDW: 21.4 fL — ABNORMAL HIGH (ref 11.5–15.0)
WBC: 14.4 E9/L — ABNORMAL HIGH (ref 4.5–11.5)

## 2015-06-26 LAB — BLOOD CULTURE: Aerobic Culture: NO GROWTH

## 2015-06-26 LAB — BASIC METABOLIC PANEL
Anion Gap: 12 mmol/L (ref 7–16)
BUN: 10 mg/dL (ref 8–23)
CO2: 19 mmol/L — ABNORMAL LOW (ref 22–29)
Calcium: 7.1 mg/dL — ABNORMAL LOW (ref 8.6–10.2)
Chloride: 111 mmol/L — ABNORMAL HIGH (ref 98–107)
Creatinine: 0.6 mg/dL — ABNORMAL LOW (ref 0.7–1.2)
GFR African American: >60
GFR Non-African American: >60 mL/min/{1.73_m2} (ref >=60)
Glucose: 140 mg/dL — ABNORMAL HIGH (ref 74–109)
Potassium: 3.2 mmol/L — ABNORMAL LOW (ref 3.5–5.0)
Sodium: 142 mmol/L (ref 132–146)

## 2015-06-26 MED ORDER — METOPROLOL SUCCINATE ER 25 MG PO TB24
25 MG | Freq: Two times a day (BID) | ORAL | Status: DC
Start: 2015-06-26 — End: 2015-06-29
  Administered 2015-06-26 – 2015-06-29 (×6): 25 mg via ORAL

## 2015-06-26 MED ORDER — POTASSIUM CHLORIDE 20 MEQ/50ML IV SOLN
20 MEQ/50ML | INTRAVENOUS | Status: AC
Start: 2015-06-26 — End: 2015-06-26
  Administered 2015-06-26 (×3): 20 meq via INTRAVENOUS

## 2015-06-26 MED FILL — OXYCODONE HCL 5 MG PO TABS: 5 MG | ORAL | Qty: 1

## 2015-06-26 MED FILL — VENLAFAXINE HCL 75 MG PO TABS: 75 MG | ORAL | Qty: 1

## 2015-06-26 MED FILL — PANTOPRAZOLE SODIUM 40 MG PO TBEC: 40 MG | ORAL | Qty: 1

## 2015-06-26 MED FILL — HEPARIN SODIUM (PORCINE) 10000 UNIT/ML IJ SOLN: 10000 UNIT/ML | INTRAMUSCULAR | Qty: 1

## 2015-06-26 MED FILL — PIPERACILLIN SOD-TAZOBACTAM SO 3.375 (3-0.375) G IV SOLR: 3.375 (3-0.375) g | INTRAVENOUS | Qty: 3.38

## 2015-06-26 MED FILL — POTASSIUM CHLORIDE 20 MEQ/50ML IV SOLN: 20 MEQ/50ML | INTRAVENOUS | Qty: 50

## 2015-06-26 MED FILL — METOPROLOL SUCCINATE ER 25 MG PO TB24: 25 MG | ORAL | Qty: 1

## 2015-06-26 MED FILL — NORMAL SALINE FLUSH 0.9 % IV SOLN: 0.9 % | INTRAVENOUS | Qty: 10

## 2015-06-26 NOTE — Progress Notes (Signed)
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 History provided by daughter; patient with min confusion s/p procedure.  Driving: yes  Occupation: retired    Pain Level: pt c/o minimal this session stating "not bad"    Cognition: oriented x 2 grossly (names wrong hospital); mod cues to state month/year  Follows gross 1-2 step commands appropriately; presents with min confusion processing (more with fatigue) and min memory deficits.       Vision/Perception  Hearing: WFL  Vision: WFL; glasses    Perception: WFL      UE Assessment:  Hand Dominance: Right   Left      ROM Strength Additional Info:    RUE  WFL 4-/5 F grip and F- FMC/dexterity noted during ADL tasks     LUE WFL 4-/5 F grip and F- FMC/dexterity noted during ADL tasks     Sensation: WFL  Tone: WFL   Edema:moderate B LE's    Functional Assessment:   Initial Status  Comments   Feeding  Min A; set up    Grooming  Max A    Upper Body Dressing Mod A     Lower Body Dressing Dep Decreased functional reach; B LE edema   Bathing NT    Toileting  Mod A standing to use urinal Min A for standing balance to use urinal   Bed Mobility  Supine to Sit: Mod A  Sit to Supine:NT  Mod cues for log roll   Functional Transfers Mod A sit<>stand    Commode-Mod A using commode rails and mod cues for technique  Lower commode surface    Functional Mobility Min/Mod A WW level; increased assist during turns  F- safety; increased confusion sequencing steps with increased fatigue     Sit balance: SBA  Stand balance: Min A WW level  Endurance/Activity tolerance: fair/-                              Comments: Upon arrival pt supine in bed.  At end of session pt assisted to bedside chair with all devices within reach, all lines and tubes intact.     Treatment: Patient assisted to EOB to increase functional endurance in preparation of self care activities and functional transfers.  Patient required Mod A to EOB after instruction on log roll technique; sat for 3-5 min with F+ balance & F/- tolerance.  Patient educated on breathing techniques during functional activities and mobility.  Educated on safe hand placement/walker safety during bathroom mobility and transfers (see above.)  Demonstrates min SOB with activity.  Patient stood ~ 5 min with Min A to use urinal.  Assisted with hygiene.      Assessment of current deficits   Functional mobility [x]   ROM []  Strength [x]   Cognition [x]   ADLs [x]    IADLs [x]  Safety Awareness [x]  Endurance [x]   Fine Motor Coordination []  Balance [x]  Vision/perception []  Sensation []    Gross Motor Coordination []      Eval Complexity: moderate  Profile and History- moderate-hx provided by daughter and review of medical consults  Assessment of Occupational Performance and Identification of Deficits- 5+ performance areas identified limiting functional independence and safe return home.  Clinical Decision Making- moderate    Treatment frequency:PRN 1-3 tx    Plan of Care:   ADL retraining [x]    Equipment needs [x]    Neuromuscular re-education []  Energy Conservation Techniques [x]   Functional Transfer training [x]  Patient and/or Family Education [x]   Functional Mobility training  [x]   Environmental Modifications [x]   Cognitive re-training [x]    Compensatory techniques for ADLs [x]   Splinting Needs []    Positioning/joint protection []   Other: []       Rehab Potential: good    Patient / Family Goal: daughter agreeable to rehab recommendation     Short term goals  Time Frame: 3-5 days  GOAL (1) Increase feeding skills to Mod I while seated up in chair to increase activity tolerance  GOAL (2) Increase grooming skills to S seated/standing sink level with F+ balance & G activity tolerance  GOAL (3) Increase UB dressing/bathing to S; set up  GOAL (4) Increase LB dressing/bathing to Min A using AE as needed for safe reach/energy conservation & demonstrating F+ balance/ G safety  GOAL (5) Increase functional transfers/bathroom mobility to Min A using AD/DME as needed for balance/energy conservation & F+ safety    Patient and/or family understands diagnosis, prognosis and plan of care: yes    []  Malnutrition indicators have been identified and nursing has been notified to ensure a dietitian consult is ordered.     Time in: 1110  Time out: 1140  Total Tx Time: Eval +15 min    Arthur PitterMaria Gerod Hunt, OTR/L [phone]

## 2015-06-26 NOTE — Care Coordination-Inpatient (Signed)
SOCIAL WORK AND DISCHARGE PLANNING: Renita PapaUPDATED [facility] OF ADMISSION.WILL NEED [facility] ORDER ON DISCHARGE .  RN TO FAX [facility] AND [facility]ING INSTRUCTIONS TO [phone] AND  CALL THEM AT [phone].Abigail MiyamotoLisa Vanya Carberry  .06/26/2015

## 2015-06-26 NOTE — Plan of Care (Signed)
Problem: Falls - Risk of  Goal: Absence of falls  Outcome: Met This Shift

## 2015-06-26 NOTE — Progress Notes (Signed)
Consult called to [facility].

## 2015-06-26 NOTE — Progress Notes (Signed)
[facility]  ATTENDING PHYSICIAN PROGRESS NOTE     I have examined the patient, reviewed the record, and discussed the case with the APN/  Resident. I have reviewed all relevant labs and imaging data.    Please refer to the  APN/ resident's note. I agree with the  assessment and plan with the following corrections/ additions. The following summarizes my clinical findings and independent assessment.     CC: follow up for liver abscess    S. Pt feels better and was transferred out of SICU overnight    O.  Vitals:    06/26/15 1144   BP: (!) 150/95   Pulse: 77   Resp: 20   Temp: 97.2 ??F (36.2 ??C)   SpO2:      NAD  afib  Lungs clear  Abdomen soft nt nd IR drain in place    ASSESSMENT:  Principal Problem:    Septic shock (HCC)  Active Problems:    Liver abscess    Malignant neoplasm of pancreas (HCC)    Right sided colitis    Hyperbilirubinemia    Jaundice    Lactic acidosis    Palliative care encounter    Acute respiratory failure requiring reintubation (HCC)    PAF (paroxysmal atrial fibrillation) (HCC)       PLAN:  Septic shock resolved  Liver abscess with Perc drain present  Final cx pending  On zosyn  Wbc down to 14    General diet    afib--cardiology consulted. Toprol 25 bid started     DVT Proph: SCDS/ heparin tid       Nadean CorwinGregory Sora Vrooman, MD, FACS  06/26/2015  3:19 PM

## 2015-06-26 NOTE — Progress Notes (Signed)
Surgery Daily Progress Note    Date of admission:  06/22/2015    Reason for ICU transfer:  Intra-abdominal sepsis    Subjective:  Patient is resting comfortably this AM, only complains of some minimal pain at drain site, otherwise no complaints.     Physical Exam:  Visit Vitals   ??? BP 126/75   ??? Pulse 94   ??? Temp 97.7 ??F (36.5 ??C)   ??? Resp 20   ??? Ht 6' (1.829 m)   ??? Wt 213 lb 13.5 oz (97 kg)   ??? SpO2 94%   ??? BMI 29 kg/m2     CONSTITUTIONAL: alert, seems comfortable   LUNGS:  non-labored respirations  CARDIOVASCULAR:  Regular rate  ABDOMEN: soft, minimally distended, IR drain in place, with serous output    ASSESSMENT / PLAN:  ?? Neuro: No acute issues, alert/oriented, pain controlled, will d/c IV pain meds- just roxy   ?? CV: Septic shock resolved, off vasopressors, one bought of afib rvr overnight likely related to fluid shifts   ?? Pulm: no acute issues, extubated 3/15 and tolerating well, aggressive pulmonary hygiene  ?? GI: Hx pancreatic cancer s/p aborted Whipple 2/2 vascular invasion, [facility] (IR drain placed 3/14) with portal venous vs portal gas, right sided colitis w concern for ischemic infarct and intramural air. CT scan done 3/16 showing increase amount of free fluid in plevis (likely secondary to IR drain placed yesterday), with no signs of free air, on clears and tolerating   ?? Renal:  Positive fluid balance, diurese as able   ?? ID: Improving leukocytosis, continue ABX at this time, F/U on pan Cxs, ID following on zosyn   ?? Endo: Mild hyperglycemia. Monitor glucose  ?? MSK: Diffusely jaundiced 2/2 hyperbilirubinemia. No other issues.  ?? Heme: Monitor H and H.        Bowel regimen: MoM  DVT proph:  SCDs, heparin   GI proph:  pepcid   Glucose protocol: SSI  Mouth/eye care: Per patient  Foley:   n/a  CVC sites:  R IJ TLC placed 3/14, R radial art line (removed)  Consults:  ID, palliative  Family Update: As able      Dispo: No acute  changes     Isabella Stallingollin Gayland Nicol, MD

## 2015-06-26 NOTE — Consults (Signed)
Reason for consult:  PAF    Patient seen with Arthur Hunt. Agree with the findings and A/P. Management plan was discussed. I have personally interviewed the patient, independently performed a focused cardiac exam, reviewed the pertinent laboratory and diagnostic testing results and directly participated in the medical decision-making.       PE:   Visit Vitals   ??? BP (!) 150/95   ??? Pulse 77   ??? Temp 97.2 ??F (36.2 ??C) (Oral)   ??? Resp 20   ??? Ht 6' (1.829 m)   ??? Wt 213 lb 13.5 oz (97 kg)   ??? SpO2 94%   ??? BMI 29 kg/m2     CONST: In general, this is a well developed, middle aged male who appears older than stated age. Awake, alert, cooperative, no apparent distress. Throat: Oral mucosa pink and moist.  HEENT: Head- normocephalic, atraumatic; Eyes:conjunctivae pink, no noted xanthomas.  Neck: no stridor, no noted enlargement of the thyroid,symmetric, no tenderness, no jugular venous distention. No carotid bruit noted.   CHEST: Symmetrical and non-tender to palpation. No noted accessory muscle use or intercostal retractions.   LUNGS: diminished AE b/l; few basal creps; no ronchi.   CARDIOVASCULAR:   Heart Inspection shows no noted pulsations  Heart Palpation: no heaves or thrills, PMI in 5th ISC near left midclavicular line   Heart Ausculation -Normal S1 and S2, RRR, no murmur, s3, s4 or rub noted.   PV: 1+ extremity edema. No varicosities. Pedal pulses palpable, no clubbing or cyanosis   ABDOMEN: Soft, non-tender to light palpation. Bowel sounds present. No palpable masses no hepatosplenomegaly or splenomegaly; no abdominal bruit / pulsation  MS: Good muscle strength and tone. Not atrophy or abnormal movements.   GU: deferred.  SKIN: Warm and dry no statis dermatitis or ulcers.   NEURO / PSYCH: Oriented to person, place and time. Speech clear and appropriate. Follows all commands. Pleasant affect.     EKG as per my interpretation: SR, no acute STT changes  Monitor: predominantly SR, PAF. Short runs      Assessment:  1.  Admitted 06/22/15 with hypotension and SOB. CT showed hepatic abscess. Blood cultures positive for gram negative and gram positive bacili(ELH). S/p drainage by IR: culture showed gram positive cocci. On ATB. ID following.   2. [place] s/[place] Undergoing chemo.   3. PAF: no prior history. Probably driven by his acute infectious issues. Was on Xarelto as OP. Predominantly SR on the monitor. CHA2DS2 vasc score 4.   4. RLE DVT based on CT. Was on Xarelto as OP.   5. No prior cardiac history.   6. HTN: not well controlled.   7. DM.  8. DNR CCA      Plan:  1. Start Toprol BID  2. His stroke risk is 4%/yr. [facility] is no other contraindication.   3. If no procedures planned start Xarelto 20mg  daily.   4. In light of his co morbidities and code status no cardiac work up planned.   5. Will see prn. Please call with questions.     Differential diagnosis and management plans discussed with the patient in detail.     Thank you for the consult. Will follow.    Dr. Gust Rungekhi P. Torren Maffeo, MD.  [facility].

## 2015-06-26 NOTE — Progress Notes (Signed)
Dr Doreen SalvageKonik notified of patient having a run of A fib RVR with rate in the 160's. Patient is now NSR in the 90's, VS are stable and patient has no complaints at this time. No new orders.

## 2015-06-26 NOTE — Progress Notes (Signed)
[facility]  [facility]  Progress Note    Chief complain : low blood pressure.     SUBJECTIVE:    Patient is awake, alert , tolerating antibiotics well.    ROS:  Constitutional:  denies fever , chills   Cardiovascular: denies chest pain or palpitation   Gastrointestinal: . Denies abdominal pain, diarrhea, no nausea or vomiting  Genitourinary:      Denies  dysuria or burning micturition  Musculoskeletal: no aches or pain   Allergic/Immunologic:  No rash or itching     OBJECTIVE:    Vitals:    06/26/15 0230 06/26/15 0235 06/26/15 0242 06/26/15 0455   BP: (!) 142/90   126/75   Pulse: 160  98 94   Resp: 18   20   Temp: 98.1 ??F (36.7 ??C)   97.7 ??F (36.5 ??C)   TempSrc:       SpO2: 90% 93%  94%   Weight:       Height:           General: AAOx3, No distress.   Heart:  RRR, NS1S2 ,No murmurs, no gallops.  Lungs:  clear to auscultation bilateraly. No labored breathing.  Abdomen: soft, non tender, non distended, No hepato or splenomegaly.   Extremites: some edema, no ulcers.  Skin:  Normal turgor,normal texture. No Janeway Lesions or Osler's nodes.   Eyes:  No sclera icterus, no subconjunctival hemorrhage.      ??? potassium chloride  20 mEq Intravenous Q1H   ??? pantoprazole  40 mg Oral QAM AC   ??? venlafaxine  37.5 mg Oral Daily   ??? heparin (porcine)  5,000 Units Subcutaneous 3 times per day   ??? piperacillin-tazobactam  3.375 g Intravenous Q8H   ??? albuterol  2.5 mg Nebulization 4x daily   ??? sodium chloride flush  10 mL Intravenous 2 times per day       LABS    CBC:   Recent Labs      06/24/15   0500  06/25/15   0430  06/26/15   0505   WBC  18.9*  16.0*  14.4*   HGB  8.7*  8.2*  8.4*   HCT  26.5*  24.4*  25.8*   PLT  127*  104*  143       BMP:    Lab Results   Component Value Date    NA 142 06/26/2015    K 3.2 06/26/2015    CL 111 06/26/2015    CO2 19 06/26/2015    BUN 10 06/26/2015    CREATININE 0.6 06/26/2015    GLUCOSE 140 06/26/2015    CALCIUM 7.1 06/26/2015        Hepatic Function Panel:     Lab Results   Component Value Date    ALKPHOS 376 06/25/2015    ALT 30 06/25/2015    AST 94 06/25/2015    PROT 4.5 06/25/2015    BILITOT 3.9 06/25/2015    BILIDIR 3.2 06/23/2015    LABALBU 1.3 06/25/2015     No results found for: SEDRATE    No results found for: CRP      Microbiology :     06/25/2015 10:13 AM - Edi, [facility] Incoming Lab Results   ??   Component Results    ?? Component   ?? Body Fluid Culture, Sterile (Abnormal)   ?? Growth present, evaluating for:   Gram positive cocci      ?? Gram Stain Result   ??  Refer to ordered Gram stain for results   ?? Organism (Abnormal)   ?? Escherichia coli   ?? Body Fluid Culture, Sterile   ?? Moderate growth   Identification and sensitivity to follow      ??   Narrative    ?? Source: FLUID ?? ?? ?? Site: liver abscess ?? ?? ?? ?? ??   ??   Culture & Susceptibility    ?? ESCHERICHIA COLI    ?? Antibiotic Interpretation MIC Unit   ?? Confirmatory Extended Spectrum Beta-Lactamase  Neg mcg/mL   ?? amoxicillin-clavulanate Intermediate =^16 mcg/mL   ?? ampicillin Resistant >=^32 mcg/mL   ?? ceFAZolin Sensitive <=^4 mcg/mL   ?? cefTRIAXone Sensitive <=^1 mcg/mL   ?? cefepime Sensitive <=^1 mcg/mL   ?? gentamicin Sensitive <=^1 mcg/mL   ?? imipenem Sensitive <=^0.25 mcg/mL   ?? levofloxacin Sensitive =^1 mcg/mL   ?? piperacillin-tazobactam Sensitive <=^4 mcg/mL   ?? trimethoprim-sulfamethoxazole Sensitive <=^20 mcg/mL   ??    ??      ??   Lab and Collection        No results for input(s): BC in the last 72 hours.  No results for input(s): BLOODCULT2 in the last 72 hours.  No results for input(s): LABURIN in the last 72 hours.  No results for input(s): CULTRESP in the last 72 hours.  No results for input(s): WNDABS in the last 72 hours.    Radiology:   Xr Chest Portable    Result Date: 06/23/2015  Patient MRN:  [id number] DOB: Jul 18, 1945 Age: [age] years Gender: Male EXAM: XR CHEST PORTABLE INDICATION: Intubation. COMPARISON: 06/23/2015 at 1746 hrs. FINDINGS: Endotracheal tube tip difficult to visualize but appears to  terminate approximately 2 cm above the carina. Nasogastric tube is satisfactory position. Right IJ catheter and left Mediport device unchanged. Mild cardiomegaly with mild perihilar pulmonary vascular congestion. Mild bibasilar atelectasis. Trace pleural fluid collections.     1. Satisfactory position of endotracheal tube. 2. Mild cardiomegaly with pulmonary vascular congestion. 3. Bibasilar atelectasis and small pleural fluid collections.     Xr Chest Portable    Result Date: 06/23/2015  Patient MRN:  [id number] DOB: Jul 18, 1945 Age: [age] years Gender: Male EXAM: XR CHEST PORTABLE INDICATION:  Shortness of breath.  COMPARISON: 06/22/2015 FINDINGS: Right IJ catheter and left-sided mediport device are unchanged. Mild cardiomegaly. Mild perihilar pulmonary vascular congestion. Bibasilar opacities concerning for developing atelectasis/infiltrates noted. Trace pleural fluid collections. No pneumothorax.     1. Mild cardiomegaly with mild perihilar pulmonary vascular congestion. 2. Trace pleural fluid collection. 3. Mild bibasilar atelectasis.     Xr Chest Portable    Result Date: 06/23/2015  Patient MRN:  [id number] DOB: Jul 18, 1945 Age: [age] years Gender: Male EXAM: XR CHEST PORTABLE INDICATION: Line placement. COMPARISON: 06/22/2015. FINDINGS: Interval placement right IJ catheter with distal tip at the level of the distal SVC in satisfactory position. Left-sided Mediport device is unchanged. Stable cardiac size. No vascular congestion. Lungs are clear and free of focal airspace consolidation, pleural effusions or pneumothorax.     1. Satisfactory position of right IJ catheter without pneumothorax.     Xr Chest Portable    Result Date: 06/22/2015  Patient MRN:  [id number] DOB: Jul 18, 1945 Age: [age] years Gender: Male Order Date:  06/22/2015 9:28 PM EXAM: XR CHEST PORTABLE NUMBER OF VIEWS:  1 INDICATION:  pre op  COMPARISON: None FINDINGS: The heart is normal in size. The mediastinum is normal in width. There is a normal appearance to the  pulmonary  vasculature. No focal airspace opacity. There is no pleural effusion. There is no pneumothorax. Left-sided Mediport device noted.     No airspace opacities or pleural effusion.     Xr Chest Abdomen Ng Placement    Result Date: 06/23/2015  Patient MRN:  [id number] DOB: 03-10-1946 Age: 63 years Gender: Male Order Date:  06/23/2015 8:28 PM EXAM: XR CHEST ABDOMEN NG PLACEMENT NUMBER OF IMAGES:  1 INDICATION: Orogastric tube COMPARISON: 06/23/2015 2015 hrs. FINDINGS: Nasogastric tube courses below the level of the hemidiaphragm with distal tip located within the region of the body/fundus of the stomach.     Satisfactory position of nasogastric/orogastric tube.     Ct Abscess Fluid Drain Perit / Retroperi    Result Date: 06/23/2015  Patient MRN:  [id number] DOB: January 11, 1946 Age: 32 years Gender: Male Order Date:  06/23/2015 4:40 PM EXAM: CT ABSCESS FLUID DRAIN PERIT / RETROPERIT NUMBER OF IMAGES:  125 INDICATION: 70 year old male with history of pancreatic cancer status post hepaticoduodenostomy, now presenting with an air-containing hepatic collection. Request made for percutaneous drainage. COMPARISON: Abdominal CT scan performed at outside contusion on 06/22/2015. PROCEDURE: The risks and benefits of the procedure were explained to the patient and the patient willingly signed the consent form allowing the procedure to be performed. With the patient supine on the CT table, and after routine prepping, draping and lidocaine anesthesia, a 10 French locking pigtail catheter was percutaneously inserted into the collection under CT and fluoroscopic guidance. Approximately 20 mL of foul-smelling, dark brown/reddish purulent fluid was aspirated from the lesion. The catheter was then fixated in place as per usual. The patient tolerated the study well and left the CT suite in stable condition.     Technically successful CT-guided liver abscess drainage. Approximately 20 cc's of foul-smelling purulent fluid was aspirated and sent  for the requested testing. Daily drainage catheter output will guide the decision as to how long to leave this drainage catheter in place.         ASSESSMENT:     1. Septic shock: Improved. He is off pressors now.  ??  2. Liver abscess: Culture with E. Coli, although the Gram stain showing some gram-positive cocci in chains and recently the culture was updated and there is some gram-positive cocci growing. His gram-positive cocci can be strep or enterococcus. For now I would put him back on Zosyn until we have our final culture results. When the drain was placed a 20 mL of foul-smelling purulent fluid was aspirated. Probably there is a component of an anaerobic infection. The E. coli is sensitive to Levaquin.  ??  3. E. coli asymptomatic bacteriuria  ??  4. Colitis: He is not behaving like C. diff colitis.? Ischemic colitis        PLAN: Continue on Zosyn. F/U final culture results. He has a Mediport. Discussed with the son in law at bedside.          Electronically signed by Oliver Hum, MD on 06/26/2015 at 7:01 AM

## 2015-06-26 NOTE — Consults (Signed)
[facility]                    [address] [place]  [address]                                     CONSULTATION    PATIENT NAME:  Arthur Hunt, Arthur Hunt                    DOB:      August 03, 1945  MED REC NO:    [id number]                         ROOM:      6510A  ACCOUNT NO:    [id number]                       ADMISSION DATE: 06/22/2015  PHYSICIAN:     Ellin Mayhew, MD               DATE OF CONSULTATION:  06/26/2015    REFERRING PHYSICIAN:  Lessie Dings, MD.     REASON FOR THE CONSULT:  Intermittent atrial fibrillation with rapid  ventricular response.    HISTORY OF PRESENT ILLNESS:  The patient is a 70 year old Caucasian male who  was previously not known to [facility] physicians in [place],  [place].  His medical history includes hypertension and diabetes mellitus with  recently diagnosed pancreatic cancer (12/2014), at which time he underwent an  attempted Whipple procedure; however, it was aborted due to vascular invasion,  and had subsequent palliative hepaticoduodenostomy.  Since that time, he has  undergone 5 chemotherapy treatments.    The patient presented to [facility] on 06/22/2015 after he was  transferred from [facility] for further evaluation of hypotension  and liver abscess.  Please note this information was obtained by the patient's  current medical chart and his wife who is present at the bedside due to his  current state of weakness and fatigue.      According to current chart documentation, he was found to be hypotensive  (asymptomatic) by a home health nurse.  Subsequently, he presented to [facility] for further evaluation.  He underwent a CT of the abdomen  and pelvis that showed a hepatic abscess of the right lobe with concern for  portal versus portal venous air and right-sided colonic thickening and  possible intramural air with concern for ischemic colitis.  Subsequently, he  was  transferred for further evaluation to [facility].      Upon arrival, initial labs showed a bilirubin of 4.3, albumin of 1.6, Alk phos  162.  H and H 8 and 24.4 with an INR of 4.4.      Upon arrival, blood pressure 113/56, heart rate 68 beats per minute, oral  temperature 98.4, with an oxygen saturation of 98%.  Soon afterward, he was  noted to become hypotensive with a blood pressure of 84/52, a temperature  100.7, a heart rate of 105.      Subsequently, a triple lumen catheter was placed, as well as arterial line.   He was transferred to the ICU and did receive FFP and 1 unit of packed red  blood cells.  He did undergo repeat CT of the abdomen and pelvis  that showed  colitis without perforation or ischemia (as interpreted by General Surgery).   WBC count at that time was noted to be 30 with lactic acid of 1.4.  During the  infusion of his FFP, he was noted to have flank pain and an SaO2 decrease to  86%.  Subsequently, the FFP was turned off (reaction ruled out).  He did  undergo a CT guided liver abscess drainage with 20 mL of foul smelling  purulent fluid and was subsequently intubated post-procedure due to decreased  respiratory status.      [facility] notified Surgery of 4 bottles of gram negative bacilli  as well as 2 bottles of gram positive bacilli being obtained prior to  presentation here.      He was extubated on 06/24/2015 and was evaluated by Infectious Disease.  He  was transferred from the ICU to a telemetry monitored unit for further  evaluation and management.  On 06/26/2015, he was noted to have paroxysmal  atrial fibrillation with short runs, predominantly sinus rhythm on the  monitor.  A 12 lead EKG showed sinus rhythm with no acute ST-T wave changes  (as interpreted by Dr. Boyce Medici).      Subsequently, Cardiology was consulted by doctor Kerin Ransom for further evaluation  of an intermittent atrial fibrillation with rapid ventricular response.      Currently, the patient is  sitting up in bed.  Denies chest pain and dyspnea.   His vital signs are stable.  A 12-lead EKG monitor strip shows sinus rhythm  with no atrial fibrillation at present, and he is in no acute distress.    PAST MEDICAL AND SURGICAL HISTORY:  1.  Hypertension.  2.  Diabetes mellitus.  3.  No reported prior cardiac testing, including cardiac catheterization,  stress test, or echocardiogram.  4.  Pancreatic cancer (diagnosed 12/2014) with reported attempted Whipple  procedure at that time that was aborted (Dr. Lyn Hollingshead) due to vascular invasion  with subsequent palliative hepaticoduodenostomy and subsequently chemo therapy  (most recent chemotherapy the last week of December 2016, per patient's wife).  5.  Reported right lower extremity DVT per CT 04/2015 (per patient's wife);  was placed on Xarelto 20 mg daily at that time. (Reports requested, currently  unavailable for further evaluation).  6.  Current DNR CCA status.  7.  Remote tobacco abuse.    FAMILY HISTORY:  Please note this information was not obtained at this time  due to the patient's current state and being limited in nature due to his  advanced age.      SOCIAL HISTORY:  Please note this information was obtained by the patient's  wife who is present at the bedside due to his current state.   She states that  he quit smoking cigarettes 35 years ago; prior to that, he smoked 1-1/2 packs  per day since he was a teenager.  States that he does drink several beers  daily (when he is out); she is uncertain as to how many.  Denies illicit drug  use.  Caffeine intake includes 3-4 cups of coffee per day.  He has not needed  use of a walker or cane prior to this admission.    ALLERGIES:  TAPE.    HOME MEDICATIONS:  Pancrelipase, regular insulin, lisinopril 30 mg daily,  magnesium oxide 400 mg daily, metformin, Zofran, Protonix, Compazine, Xarelto  20 mg daily, and Effexor.    HOSPITAL MEDICATIONS:  Albuterol, heparin 5000 units every 8 hours,  magnesium  2 grams x1,  Protonix, Zosyn, and Effexor.     REVIEW OF SYSTEMS:  Please note this information was not obtained at this time  due to the patient's current state.    PHYSICAL EXAMINATION:  VITAL SIGNS:  Oral temperature 97.2, heart rate 77 beats per minute,  respiratory rate 20, blood pressure 150/95, oxygen saturation 94% on 2 liters  nasal cannula.  CONSTITUTIONAL:  In general, this is a well-developed, well-nourished  70 year old Caucasian male who appears his stated age, who is awake, alert,  cooperative, in no apparent distress.  HEENT:  Eyes:  Conjunctivae pink.  No noted xanthomas.  Throat:  Oral mucosa  pink and moist.  NECK:  No stridor, symmetrical, nontender, no JVD.  CHEST:  Symmetrical, nontender with palpation.  No accessory muscle use or  intercostal retractions.  Lung auscultation:  Diminished throughout lung  fields, few basal crepitations.  No rhonchi.   CARDIOVASCULAR:  No carotid bruit noted.  Heart inspection shows no noted  pulsations, heart palpation, heaves or thrills.  PMI nondisplaced.  Heart  auscultation:  Regular rate and rhythm.  No murmur or rub.  PERIPHERAL VASCULAR:  1+ pitting edema to bilateral lower extremities.  Pedal  pulses palpable and equal.  No clubbing or cyanosis.  ABDOMEN:  Soft, nontender with palpation.  Bowel sounds present x4 quadrants.   No palpable masses.  No abdominal bruit or pulsation.  MUSCULOSKELETAL:  Good muscle strength and tone.  No atrophy or abnormal  movements.  GENITOURINARY:  Deferred.  SKIN:  Warm and dry.  No stasis dermatitis or ulcerations.  NEUROLOGIC:  Alert and oriented x3.  Speech clear and appropriate.  Follows  simple commands.  Flat affect.    LABS AND TESTS:  CK-MB 0.6.  Troponin less than 0.015.  Sodium 142, potassium  3.2, chloride 111, CO2 of 19, BUN 10, creatinine 0.6.  Blood glucose 140.  WBC  14.4; hemoglobin 8.4; hematocrit 25.8; platelets 143,000.  Calcium 7.1.  GFR  greater than 60.  Liver abscess IR culture:  Gram positive cocci.  CT of  the  abdomen and pelvis 06/24/2015:  Interval improvement of the right hepatic  abscess following catheter drainage placement.  Interval development of  distended small bowel, which could represent early ileus.  No evidence of  pneumonitis, intestinalis, or bowel perforation.  Interval increase of ascites  and bilateral pleural effusions with adjacent atelectasis.  Chest x-ray:   Stable enlarged cardiomediastinal silhouette; bibasilar infiltrate/pneumonia  with bilateral pleural effusions, right greater than left; underlying mild to  moderate pulmonary edema; interval removal of endotracheal tube and NG tube.    IMPRESSION AND PLAN:  To follow as per Dr. Boyce Medici.      Dictated by: Honor Loh, NP      Ellin Mayhew, MD      D: 06/26/2015 15:01:36  T: 06/26/2015 15:41:14  RC/nts  Job#: [id number]  Doc#: [id number]             '[]' Hide copied text  '[]' Hover for attribution information  Reason for consult: PAF  ??  Patient seen with Honor Loh. Agree with the findings and A/P. Management plan was discussed. I have personally interviewed the patient, independently performed a focused cardiac exam, reviewed the pertinent laboratory and diagnostic testing results and directly participated in the medical decision-making.   ??  ??  PE:        Visit Vitals   ??? BP (!) 150/95   ??? Pulse 77   ???  Temp 97.2 ??F (36.2 ??C) (Oral)   ??? Resp 20   ??? Ht 6' (1.829 m)   ??? Wt 213 lb 13.5 oz (97 kg)   ??? SpO2 94%   ??? BMI 29 kg/m2   ??  CONST: In general, this is a well developed, middle aged male who appears older than stated age. Awake, alert, cooperative, no apparent distress. Throat: Oral mucosa pink and moist.  HEENT: Head- normocephalic, atraumatic; Eyes:conjunctivae pink, no noted xanthomas.  Neck: no stridor, no noted enlargement of the thyroid,symmetric, no tenderness, no jugular venous distention. No carotid bruit noted.   CHEST: Symmetrical and non-tender to palpation. No noted accessory muscle use or intercostal retractions.   LUNGS:  diminished AE b/l; few basal creps; no ronchi.   CARDIOVASCULAR:   Heart Inspection shows no noted pulsations  Heart Palpation: no heaves or thrills, PMI in 5th ISC near left midclavicular line   Heart Ausculation -Normal S1 and S2, RRR, no murmur, s3, s4 or rub noted.   PV: 1+ extremity edema. No varicosities. Pedal pulses palpable, no clubbing or cyanosis   ABDOMEN: Soft, non-tender to light palpation. Bowel sounds present. No palpable masses no hepatosplenomegaly or splenomegaly; no abdominal bruit / pulsation  MS: Good muscle strength and tone. Not atrophy or abnormal movements.   GU: deferred.  SKIN: Warm and dry no statis dermatitis or ulcers.   NEURO / PSYCH: Oriented to person, place and time. Speech clear and appropriate. Follows all commands. Pleasant affect.   ??  EKG as per my interpretation: SR, no acute STT changes  Monitor: predominantly SR, PAF. Short runs  ??  ??  Assessment:  1. Admitted 06/22/15 with hypotension and SOB. CT showed hepatic abscess. Blood cultures positive for gram negative and gram positive bacili([facility]). S/p drainage by IR: culture showed gram positive cocci. On ATB. ID following.   2. Pancreatic CA s/p aborted Whipple(vascular invasion) in [place]. Undergoing chemo.   3. PAF: no prior history. Probably driven by his acute infectious issues. Was on Xarelto as OP. Predominantly SR on the monitor. CHA2DS2 vasc score 4.   4. RLE DVT based on CT. Was on Xarelto as OP.   5. No prior cardiac history.   6. HTN: not well controlled.   7. DM.  8. DNR CCA  ??  ??  Plan:  1. Start Toprol BID  2. His stroke risk is 4%/yr. He is a candidate for [facility] if there is no other contraindication.   3. If no procedures planned start Xarelto 69m daily.   4. In light of his co morbidities and code status no cardiac work up planned.   5. Will see prn. Please call with questions.   ??  Differential diagnosis and management plans discussed with the patient in detail.   ??  Thank you for the consult. Will  follow.  ??  Dr. REllin Mayhew MD.  [facility].    ??      ?? ??   ??

## 2015-06-26 NOTE — Progress Notes (Signed)
While rounding on patient, son in law at bedside asked if patient should be medicated for pain. Patient is alert and oriented x4 and states he is not in pain and does not need pain medication. Will continue to monitor.

## 2015-06-26 NOTE — Care Coordination-Inpatient (Addendum)
SOCIAL WORK AND DISCHARGE PLANNING: PLAN IS TO SAR. I MADE A REFERRAL TO [facility] IN [place] PER WIFE'S REQUEST. REP HASNT CALLED ME????BACK BUT I FAXED THE CHART INFO . WILL FOLLOW UP ON Monday. A PRECERT IS NOT NEEDED. COMPLETED THE N17 IN EPIC. WILL NEED EXEMPT AND ENVELOPE WHEN CONFIRMED.  [name].Abigail MiyamotoLisa Kani Jobson  06/26/2015    SPOKE WITH PAM AT THE [facility]. THEY HAVE A 12 BED SHORT TERM FACILITY BUT IT IS NOT OPEN YET. SHE WILL FOLLOW UP WITH ME ON Monday. I TOLD WIFE AND PROVIDED HER WITH SAR LIST. SHE SAID THAT Pt 'S [facility] [place] [facility] [place]. WILL FOLLOW UP WITH WIFE ON Monday FOR CHOICES. A PRECERT IS NOT NEEDED. [name].Abigail MiyamotoLisa Nazar Kuan  06/26/2015

## 2015-06-26 NOTE — Progress Notes (Signed)
Physical Therapy    Facility/Department: [facility] 6SE PCCU  Initial Assessment    NAME: Arthur Hunt  DOB: 11/16/1945  MRN: [id number]    Date of Service: 06/26/2015    Evaluating Therapist: Hilbert Corrigan, PT, DPT    Recommendation for discharge is SAR  AM-PAC Basic Mobility Inpatient Short Form: 10/20    Room #: 6510/6510-A  DIAGNOSIS: Bowel Infarct, Liver Abscess   PRECAUTIONS: Fall Risk, IR Drain, Hx of pancreatic cancer    Social:  Pt lives with wife, who is able to assist, in a bi-level floor plan with 1 step and 0 rails to enter.  Pt reports being having a bed/bath on main floor.  Prior to admission pt walked with no AD and Independent with ADL's.     Initial Evaluation  Date: 06/26/15 Treatment  Date: NT    Short Term/ Long Term   Goals   Was pt agreeable to Eval/treatment? Yes     Does pt have pain? Pt c/o abdominal pain but no rating given     Bed Mobility  Rolling: Mod A  Supine to sit: Mod A  Sit to supine: NT  Scooting: NT  Min A   Transfers Sit to stand: Mod A  Stand to sit: Mod A  Stand pivot: Mod A with [employer]  Min A with [employer]   Ambulation   10 feet x 2 with Mod A with [employer]  >50 feet with Min A with [employer]   Stair negotiation: ascended and descended NT  >2 steps with 1 rail with ModA   BLE ROM WNL     BLE strength 4-/5  4/5   Balance Sitting: SBA   Standing: Mod A with [employer]  Sitting: Independent   Standing: Min A with [employer]     Pt is alert and Oriented x 3    Sensation: WNL  Edema: mild BLE edema     ASSESSMENT  Pt displays functional ability as noted in the objective portion of this evaluation.      Comments/Treatment:  Pt supine upon entry with HOB elevated and agreeable to a PT initial evaluation.  Pt was instructed on a proper log roll technique and required physical assistance to complete transfer to a sitting position.  Pt sat EOB for 3 minutes.  Pt was instructed on proper hand placements to perform STS.  Upon standing, pt requested to use the restroom.  Pt ambulated with a decreased gait speed and a step-to gait  pattern.  Pt was given cues to maintain forward gaze.  Pt exhibited labored breathing and was given cues on a corrective breathing pattern.  Once inside bathroom, pt required physical assistance with [facility] management and cueing for correct turn-pivot sequencing.  Pt was cued for correct hand placements when transferring onto the toilet.  Pt was unable to void while seated and requested to stand and use the urinal requiring assist for balance.  Pt requested to end ambulation due to increased fatigue.  Pt was returned to a bedside chair with all needs met and call switch given.  Pt's daughter was present and educated on assessment findings and discharge recommendations.  Recommend SAR to improve functional independence.    SpO2 measured on room air prior to and after activity/mobility was 95%.        Patient education  Pt educated on safety, gait and transfer     Patient response to education:   Pt verbalized understanding Pt demonstrated skill Pt requires further education in this area  x  x     Rehab potential is Good for reaching above PT goals.    Pt???s/ family goals   1. Improve Function    Patient and or family understand(s) diagnosis, prognosis, and plan of care.    PLAN  PT care will be provided in accordance with the objectives noted above.  Whenever appropriate, clear delegation orders will be provided for nursing staff.  Exercises and functional mobility practice will be used as well as appropriate assistive devices or modalities to obtain goals. Patient and family education will also be administered as needed.    Frequency of treatments will be 2-5x/week x 7-10 days.    Time in: 1110  Time out: [address] SPT  Hilbert Corrigan, , [place]  [id number]

## 2015-06-27 LAB — BASIC METABOLIC PANEL
Anion Gap: 9 mmol/L (ref 7–16)
BUN: 8 mg/dL (ref 8–23)
CO2: 23 mmol/L (ref 22–29)
Calcium: 8.1 mg/dL — ABNORMAL LOW (ref 8.6–10.2)
Chloride: 105 mmol/L (ref 98–107)
Creatinine: 0.6 mg/dL — ABNORMAL LOW (ref 0.7–1.2)
GFR African American: >60
GFR Non-African American: >60 mL/min/{1.73_m2} (ref >=60)
Glucose: 149 mg/dL — ABNORMAL HIGH (ref 74–109)
Potassium: 4 mmol/L (ref 3.5–5.0)
Sodium: 137 mmol/L (ref 132–146)

## 2015-06-27 LAB — CBC WITH AUTO DIFFERENTIAL
Basophils %: 0.5 % (ref 0.0–2.0)
Basophils Absolute: 0 E9/L (ref 0.00–0.20)
Eosinophils %: 0.7 % (ref 0.0–6.0)
Eosinophils Absolute: 0 E9/L — ABNORMAL LOW (ref 0.05–0.50)
Hematocrit: 24.3 % — ABNORMAL LOW (ref 37.0–54.0)
Hemoglobin: 7.8 g/dL — ABNORMAL LOW (ref 12.5–16.5)
Lymphocytes %: 6.1 % — ABNORMAL LOW (ref 20.0–42.0)
Lymphocytes Absolute: 0.66 E9/L — ABNORMAL LOW (ref 1.50–4.00)
MCH: 23.8 pg — ABNORMAL LOW (ref 26.0–35.0)
MCHC: 32.1 % (ref 32.0–34.5)
MCV: 74.1 fL — ABNORMAL LOW (ref 80.0–99.9)
MPV: 9.7 fL (ref 7.0–12.0)
Monocytes %: 11.4 % (ref 2.0–12.0)
Monocytes Absolute: 1.21 E9/L — ABNORMAL HIGH (ref 0.10–0.95)
Myelocyte Percent: 1.8 % (ref 0–0)
Neutrophils %: 80.7 % — ABNORMAL HIGH (ref 43.0–80.0)
Neutrophils Absolute: 9.13 E9/L — ABNORMAL HIGH (ref 1.80–7.30)
Platelets: 146 E9/L (ref 130–450)
RBC: 3.28 E12/L — ABNORMAL LOW (ref 3.80–5.80)
RDW: 21.4 fL — ABNORMAL HIGH (ref 11.5–15.0)
WBC: 11 E9/L (ref 4.5–11.5)
nRBC: 0.9 /100 WBC

## 2015-06-27 MED ORDER — PANCRELIPASE (LIP-PROT-AMYL) 12000-38000 UNITS PO CPEP
12000-38000 units | Freq: Three times a day (TID) | ORAL | Status: DC
Start: 2015-06-27 — End: 2015-06-28
  Administered 2015-06-27 – 2015-06-28 (×3): 2 via ORAL

## 2015-06-27 MED ORDER — RIVAROXABAN 20 MG PO TABS
20 MG | Freq: Every day | ORAL | Status: DC
Start: 2015-06-27 — End: 2015-06-29
  Administered 2015-06-27 – 2015-06-28 (×2): 20 mg via ORAL

## 2015-06-27 MED FILL — HEPARIN SODIUM (PORCINE) 10000 UNIT/ML IJ SOLN: 10000 UNIT/ML | INTRAMUSCULAR | Qty: 1

## 2015-06-27 MED FILL — NORMAL SALINE FLUSH 0.9 % IV SOLN: 0.9 % | INTRAVENOUS | Qty: 10

## 2015-06-27 MED FILL — CREON 12000-38000 UNITS PO CPEP: 12000-38000 units | ORAL | Qty: 4

## 2015-06-27 MED FILL — OXYCODONE HCL 5 MG PO TABS: 5 MG | ORAL | Qty: 1

## 2015-06-27 MED FILL — PANTOPRAZOLE SODIUM 40 MG PO TBEC: 40 MG | ORAL | Qty: 1

## 2015-06-27 MED FILL — METOPROLOL SUCCINATE ER 25 MG PO TB24: 25 MG | ORAL | Qty: 1

## 2015-06-27 MED FILL — PIPERACILLIN SOD-TAZOBACTAM SO 3.375 (3-0.375) G IV SOLR: 3.375 (3-0.375) g | INTRAVENOUS | Qty: 3.38

## 2015-06-27 MED FILL — XARELTO 20 MG PO TABS: 20 MG | ORAL | Qty: 1

## 2015-06-27 MED FILL — VENLAFAXINE HCL 75 MG PO TABS: 75 MG | ORAL | Qty: 1

## 2015-06-27 NOTE — Plan of Care (Signed)
Problem: Risk for Impaired Skin Integrity  Goal: Tissue integrity - skin and mucous membranes  Structural intactness and normal physiological function of skin and  mucous membranes.   Outcome: Met This Shift    Problem: Aspiration:  Goal: Absence of aspiration  Absence of aspiration   Outcome: Met This Shift    Problem: Musculor/Skeletal Functional Status  Goal: Highest potential functional level  Outcome: Met This Shift

## 2015-06-27 NOTE — Progress Notes (Signed)
[facility]  [facility]  Progress Note    Chief complain : low blood pressure.     SUBJECTIVE:    Patient is awake, alert , tolerating antibiotics well.    ROS:  Constitutional:  denies fever , chills   Cardiovascular: denies chest pain or palpitation   Gastrointestinal: . Denies abdominal pain, diarrhea, no nausea or vomiting  Genitourinary:      Denies  dysuria or burning micturition  Musculoskeletal: no aches or pain   Allergic/Immunologic:  No rash or itching     OBJECTIVE:    Vitals:    06/27/15 0400 06/27/15 0625 06/27/15 0821 06/27/15 0845   BP:   (!) 146/75    Pulse:   84    Resp:   16    Temp:   97.5 ??F (36.4 ??C)    TempSrc:       SpO2: 94%  94% 94%   Weight:  212 lb 12.8 oz (96.5 kg)     Height:           General: AAOx3, No distress.   Heart:  RRR, NS1S2 ,No murmurs, no gallops.  Lungs:  clear to auscultation bilateraly. No labored breathing.  Abdomen: soft, non tender, non distended, No hepato or splenomegaly.   Extremites: some edema, no ulcers.  Skin:  Normal turgor,normal texture. No Janeway Lesions or Osler's nodes.   Eyes:  No sclera icterus, no subconjunctival hemorrhage.      ??? lipase-protease-amylase  4 capsule Oral TID AC   ??? rivaroxaban  20 mg Oral Daily   ??? metoprolol succinate  25 mg Oral BID   ??? pantoprazole  40 mg Oral QAM AC   ??? venlafaxine  37.5 mg Oral Daily   ??? heparin (porcine)  5,000 Units Subcutaneous 3 times per day   ??? piperacillin-tazobactam  3.375 g Intravenous Q8H   ??? albuterol  2.5 mg Nebulization 4x daily   ??? sodium chloride flush  10 mL Intravenous 2 times per day       LABS    CBC:   Recent Labs      06/25/15   0430  06/26/15   0505  06/27/15   0550   WBC  16.0*  14.4*  11.0   HGB  8.2*  8.4*  7.8*   HCT  24.4*  25.8*  24.3*   PLT  104*  143  146       BMP:    Lab Results   Component Value Date    NA 137 06/27/2015    K 4.0 06/27/2015    CL 105 06/27/2015    CO2 23 06/27/2015    BUN 8 06/27/2015    CREATININE 0.6 06/27/2015    GLUCOSE 149  06/27/2015    CALCIUM 8.1 06/27/2015        Hepatic Function Panel:    Lab Results   Component Value Date    ALKPHOS 376 06/25/2015    ALT 30 06/25/2015    AST 94 06/25/2015    PROT 4.5 06/25/2015    BILITOT 3.9 06/25/2015    BILIDIR 3.2 06/23/2015    LABALBU 1.3 06/25/2015     No results found for: SEDRATE    No results found for: CRP      Microbiology :     06/25/2015 10:13 AM - Edi, [facility] Incoming Lab Results   ??   Component Results    ?? Component   ?? Body Fluid Culture, Sterile (Abnormal)   ??  Growth present, evaluating for:   Gram positive cocci      ?? Gram Stain Result   ?? Refer to ordered Gram stain for results   ?? Organism (Abnormal)   ?? Escherichia coli   ?? Body Fluid Culture, Sterile   ?? Moderate growth   Identification and sensitivity to follow      ??   Narrative    ?? Source: FLUID ?? ?? ?? Site: liver abscess ?? ?? ?? ?? ??   ??   Culture & Susceptibility    ?? ESCHERICHIA COLI    ?? Antibiotic Interpretation MIC Unit   ?? Confirmatory Extended Spectrum Beta-Lactamase  Neg mcg/mL   ?? amoxicillin-clavulanate Intermediate =^16 mcg/mL   ?? ampicillin Resistant >=^32 mcg/mL   ?? ceFAZolin Sensitive <=^4 mcg/mL   ?? cefTRIAXone Sensitive <=^1 mcg/mL   ?? cefepime Sensitive <=^1 mcg/mL   ?? gentamicin Sensitive <=^1 mcg/mL   ?? imipenem Sensitive <=^0.25 mcg/mL   ?? levofloxacin Sensitive =^1 mcg/mL   ?? piperacillin-tazobactam Sensitive <=^4 mcg/mL   ?? trimethoprim-sulfamethoxazole Sensitive <=^20 mcg/mL   ??    ??      ??   Lab and Collection        No results for input(s): BC in the last 72 hours.  No results for input(s): BLOODCULT2 in the last 72 hours.  No results for input(s): LABURIN in the last 72 hours.  No results for input(s): CULTRESP in the last 72 hours.  No results for input(s): WNDABS in the last 72 hours.    Radiology:   Xr Chest Portable    Result Date: 06/23/2015  Patient MRN:  [id number] DOB: 06/02/1945 Age: 70 years Gender: Male EXAM: XR CHEST PORTABLE INDICATION: Intubation. COMPARISON: 06/23/2015 at 1746 hrs.  FINDINGS: Endotracheal tube tip difficult to visualize but appears to terminate approximately 2 cm above the carina. Nasogastric tube is satisfactory position. Right IJ catheter and left Mediport device unchanged. Mild cardiomegaly with mild perihilar pulmonary vascular congestion. Mild bibasilar atelectasis. Trace pleural fluid collections.     1. Satisfactory position of endotracheal tube. 2. Mild cardiomegaly with pulmonary vascular congestion. 3. Bibasilar atelectasis and small pleural fluid collections.     Xr Chest Portable    Result Date: 06/23/2015  Patient MRN:  [id number] DOB: [birth year] Age: 16 years Gender: Male EXAM: XR CHEST PORTABLE INDICATION:  Shortness of breath.  COMPARISON: 06/22/2015 FINDINGS: Right IJ catheter and left-sided mediport device are unchanged. Mild cardiomegaly. Mild perihilar pulmonary vascular congestion. Bibasilar opacities concerning for developing atelectasis/infiltrates noted. Trace pleural fluid collections. No pneumothorax.     1. Mild cardiomegaly with mild perihilar pulmonary vascular congestion. 2. Trace pleural fluid collection. 3. Mild bibasilar atelectasis.     Xr Chest Portable    Result Date: 06/23/2015  Patient MRN:  [id number] DOB: 1946/02/20 Age: 69 years Gender: Male EXAM: XR CHEST PORTABLE INDICATION: Line placement. COMPARISON: 06/22/2015. FINDINGS: Interval placement right IJ catheter with distal tip at the level of the distal SVC in satisfactory position. Left-sided Mediport device is unchanged. Stable cardiac size. No vascular congestion. Lungs are clear and free of focal airspace consolidation, pleural effusions or pneumothorax.     1. Satisfactory position of right IJ catheter without pneumothorax.     Xr Chest Portable    Result Date: 06/22/2015  Patient MRN:  [id number] DOB: May 28, 1945 Age: 61 years Gender: Male Order Date:  06/22/2015 9:28 PM EXAM: XR CHEST PORTABLE NUMBER OF VIEWS:  1 INDICATION:  pre op  COMPARISON: None FINDINGS:  The heart is normal in size. The  mediastinum is normal in width. There is a normal appearance to the pulmonary vasculature. No focal airspace opacity. There is no pleural effusion. There is no pneumothorax. Left-sided Mediport device noted.     No airspace opacities or pleural effusion.     Xr Chest Abdomen Ng Placement    Result Date: 06/23/2015  Patient MRN:  [id number] DOB: [birth year] Age: 79 years Gender: Male Order Date:  06/23/2015 8:28 PM EXAM: XR CHEST ABDOMEN NG PLACEMENT NUMBER OF IMAGES:  1 INDICATION: Orogastric tube COMPARISON: 06/23/2015 2015 hrs. FINDINGS: Nasogastric tube courses below the level of the hemidiaphragm with distal tip located within the region of the body/fundus of the stomach.     Satisfactory position of nasogastric/orogastric tube.     Ct Abscess Fluid Drain Perit / Retroperi    Result Date: 06/23/2015  Patient MRN:  [id number] DOB: 05-10-1945 Age: 46 years Gender: Male Order Date:  06/23/2015 4:40 PM EXAM: CT ABSCESS FLUID DRAIN PERIT / RETROPERIT NUMBER OF IMAGES:  125 INDICATION: 70 year old male with history of pancreatic cancer status post hepaticoduodenostomy, now presenting with an air-containing hepatic collection. Request made for percutaneous drainage. COMPARISON: Abdominal CT scan performed at outside contusion on 06/22/2015. PROCEDURE: The risks and benefits of the procedure were explained to the patient and the patient willingly signed the consent form allowing the procedure to be performed. With the patient supine on the CT table, and after routine prepping, draping and lidocaine anesthesia, a 10 French locking pigtail catheter was percutaneously inserted into the collection under CT and fluoroscopic guidance. Approximately 20 mL of foul-smelling, dark brown/reddish purulent fluid was aspirated from the lesion. The catheter was then fixated in place as per usual. The patient tolerated the study well and left the CT suite in stable condition.     Technically successful CT-guided liver abscess drainage.  Approximately 20 cc's of foul-smelling purulent fluid was aspirated and sent for the requested testing. Daily drainage catheter output will guide the decision as to how long to leave this drainage catheter in place.         ASSESSMENT:     1. Septic shock: Improved. He is off pressors now.  ??  2. Liver abscess: Culture with E. Coli, although the Gram stain showing some gram-positive cocci in chains and recently the culture was updated and there is coag negative staph plus another GNR. s. For now I would put him back on Zosyn until we have our final culture results. When the drain was placed a 20 mL of foul-smelling purulent fluid was aspirated. Probably there is a component of an anaerobic infection. The E. coli is sensitive to Levaquin.  ??  3. E. coli asymptomatic bacteriuria  ??  4. Colitis: He is not behaving like C. diff colitis.? Ischemic colitis        PLAN: Continue on Zosyn. F/U final culture results . He has a Mediport. Discussed with the daughter at bedside.   She mentioned that in January he was treated for E coli infection in his blood with 2 weeks of Ceftriaxone.   Will give final plan when we have the final culture results.          Electronically signed by Oliver Hum, MD on 06/27/2015 at 9:39 AM

## 2015-06-27 NOTE — Progress Notes (Signed)
Spoke with sroc about pain meds. Ok to give half of roxi.

## 2015-06-27 NOTE — Progress Notes (Signed)
[facility]  ATTENDING PHYSICIAN PROGRESS NOTE     I have examined the patient, reviewed the record, and discussed the case with the APN/  Resident. I have reviewed all relevant labs and imaging data.    Please refer to the  APN/ resident's note. I agree with the  assessment and plan with the following corrections/ additions. The following summarizes my clinical findings and independent assessment.     CC: follow up for liver abscess    S. Pt feels better . Cardiology consulted for afib    O.  Vitals:    06/27/15 0845   BP:    Pulse:    Resp:    Temp:    SpO2: 94%     NAD  afib  Lungs clear  Abdomen soft nt nd IR drain in place    ASSESSMENT:  Principal Problem:    Septic shock (HCC)  Active Problems:    Liver abscess    Malignant neoplasm of pancreas (HCC)    Right sided colitis    Hyperbilirubinemia    Jaundice    Lactic acidosis    Palliative care encounter    Acute respiratory failure requiring reintubation (HCC)    PAF (paroxysmal atrial fibrillation) (HCC)       PLAN:  Septic shock resolved  Liver abscess with Perc drain present  Final cx pending  On zosyn  If pt needs IV abx, he has a medport that can be used       Resume Creon for pancreatic insufficiency  Will resume xarelto for RLE DVT    General diet    afib--cardiology consulted. Toprol 25 bid       DVT Proph: SCDS/ heparin tid       Nadean CorwinGregory Marlana Mckowen, MD, FACS  06/27/2015  9:08 AM

## 2015-06-27 NOTE — Progress Notes (Signed)
Reason for follow up:  PAF; pancreatic CA; hepatic abcess.     Subjective:  No new complaints. No CP or SOB.   Objective:    No distress. No events overnight.                                        Scheduled Meds:  ??? lipase-protease-amylase  4 capsule Oral TID AC   ??? rivaroxaban  20 mg Oral Daily   ??? metoprolol succinate  25 mg Oral BID   ??? pantoprazole  40 mg Oral QAM AC   ??? venlafaxine  37.5 mg Oral Daily   ??? heparin (porcine)  5,000 Units Subcutaneous 3 times per day   ??? piperacillin-tazobactam  3.375 g Intravenous Q8H   ??? albuterol  2.5 mg Nebulization 4x daily   ??? sodium chloride flush  10 mL Intravenous 2 times per day           Intake/Output Summary (Last 24 hours) at 06/27/15 0959  Last data filed at 06/27/15 0625   Gross per 24 hour   Intake              380 ml   Output              405 ml   Net              -25 ml       Patient Vitals for the past 96 hrs (Last 3 readings):   Weight   06/27/15 0625 212 lb 12.8 oz (96.5 kg)   06/25/15 0600 213 lb 13.5 oz (97 kg)   06/24/15 0411 193 lb (87.5 kg)          PE:   Visit Vitals   ??? BP (!) 146/75   ??? Pulse 84   ??? Temp 97.5 ??F (36.4 ??C)   ??? Resp 16   ??? Ht 6' (1.829 m)   ??? Wt 212 lb 12.8 oz (96.5 kg)   ??? SpO2 94%   ??? BMI 28.86 kg/m2     CONST: In general, this is a well developed, middle aged male who appears of stated age. Awake, alert, cooperative, no apparent distress. Throat: Oral mucosa pink and moist.  HEENT: Head- normocephalic, atraumatic; Eyes:conjunctivae pink, no noted xanthomas.  Neck: no stridor, no noted enlargement of the thyroid,symmetric, no tenderness, no jugular venous distention. No carotid bruit noted.   CHEST: Symmetrical and non-tender to palpation. No noted accessory muscle use or intercostal retractions.   LUNGS: normal AE b/l; no creps; no ronchi.   CARDIOVASCULAR:   Heart Inspection shows no noted pulsations  Heart Palpation: no heaves or thrills, PMI in 5th ISC near left midclavicular line   Heart Ausculation -Normal S1 and S2, RRR, no  murmur, s3, s4 or rub noted.   PV: no extremity edema. No varicosities. Pedal pulses palpable, no clubbing or cyanosis   ABDOMEN: Soft, non-tender to light palpation. Bowel sounds present. No palpable masses no hepatosplenomegaly or splenomegaly; no abdominal bruit / pulsation  MS: Good muscle strength and tone. Not atrophy or abnormal movements.   GU: deferred.  SKIN: Warm and dry no statis dermatitis or ulcers.   NEURO / PSYCH: Oriented to person, place and time. Speech clear and appropriate. Follows all commands. Pleasant affect.       Monitor: SR, no more AF.       Lab Review  Recent Labs      06/25/15   0430  06/26/15   0505  06/27/15   0550   WBC  16.0*  14.4*  11.0   HGB  8.2*  8.4*  7.8*   HCT  24.4*  25.8*  24.3*   PLT  104*  143  146       Recent Labs      06/25/15   0430  06/26/15   0505  06/27/15   0550   NA  139  142  137   K  3.5  3.2*  4.0   CL  109*  111*  105   CO2  18*  19*  23   PHOS  3.0   --    --    BUN  CREATININE  0.7  0.6*  0.6*       Recent Labs      06/25/15   0430   AST  94*   ALT  30   ALKPHOS  376*       Assessment:  1. Admitted 06/22/15 with hypotension and SOB. CT showed hepatic abscess. Blood cultures positive for gram negative and gram positive bacili([facility]). S/p drainage by IR: culture showed gram positive cocci. On ATB. ID following.   2. Pancreatic CA s/p [place]. Undergoing chemo.   3. PAF: no prior history. Probably driven by his acute infectious issues. Was on Xarelto as OP. Predominantly SR on the monitor. CHA2DS2 vasc score 4.   4. RLE DVT based on CT. Was on Xarelto as OP.   5. No prior cardiac history.   6. HTN: not well controlled.   7. DM.  8. DNR CCA  ??  ??  Plan:  1. Continue Toprol. Tolerating it. No more AF  2. His stroke risk is 4%/yr. He is a candidate for [facility] if there is no other contraindication.  If no procedures planned start Xarelto  daily. Also needed for his DVT.   3. In light of his co morbidities  and code status no cardiac work up planned.   4. Cardiology will sign off. Please call with questions.         Dr. Gust Rung, MD.  [facility].

## 2015-06-28 LAB — BASIC METABOLIC PANEL
Anion Gap: 12 mmol/L (ref 7–16)
BUN: 5 mg/dL — ABNORMAL LOW (ref 8–23)
CO2: 23 mmol/L (ref 22–29)
Calcium: 8.2 mg/dL — ABNORMAL LOW (ref 8.6–10.2)
Chloride: 102 mmol/L (ref 98–107)
Creatinine: 0.6 mg/dL — ABNORMAL LOW (ref 0.7–1.2)
GFR African American: >60
GFR Non-African American: >60 mL/min/{1.73_m2} (ref >=60)
Glucose: 133 mg/dL — ABNORMAL HIGH (ref 74–109)
Potassium: 3.8 mmol/L (ref 3.5–5.0)
Sodium: 137 mmol/L (ref 132–146)

## 2015-06-28 LAB — CBC WITH AUTO DIFFERENTIAL
Basophils %: 0.6 % (ref 0.0–2.0)
Basophils Absolute: 0 E9/L (ref 0.00–0.20)
Eosinophils %: 0.4 % (ref 0.0–6.0)
Eosinophils Absolute: 0 E9/L — ABNORMAL LOW (ref 0.05–0.50)
Hematocrit: 25.5 % — ABNORMAL LOW (ref 37.0–54.0)
Hemoglobin: 8.3 g/dL — ABNORMAL LOW (ref 12.5–16.5)
Lymphocytes %: 7.8 % — ABNORMAL LOW (ref 20.0–42.0)
Lymphocytes Absolute: 0.98 E9/L — ABNORMAL LOW (ref 1.50–4.00)
MCH: 24.2 pg — ABNORMAL LOW (ref 26.0–35.0)
MCHC: 32.5 % (ref 32.0–34.5)
MCV: 74.3 fL — ABNORMAL LOW (ref 80.0–99.9)
MPV: 9.9 fL (ref 7.0–12.0)
Monocytes %: 11.3 % (ref 2.0–12.0)
Monocytes Absolute: 1.35 E9/L — ABNORMAL HIGH (ref 0.10–0.95)
Neutrophils %: 80.9 % — ABNORMAL HIGH (ref 43.0–80.0)
Neutrophils Absolute: 9.96 E9/L — ABNORMAL HIGH (ref 1.80–7.30)
Platelets: 227 E9/L (ref 130–450)
RBC: 3.43 E12/L — ABNORMAL LOW (ref 3.80–5.80)
RDW: 21.3 fL — ABNORMAL HIGH (ref 11.5–15.0)
WBC: 12.3 E9/L — ABNORMAL HIGH (ref 4.5–11.5)

## 2015-06-28 LAB — CULTURE, BLOOD 2: Culture, Blood 2: 5

## 2015-06-28 LAB — CULTURE, BODY FLUID

## 2015-06-28 LAB — CULTURE BLOOD #1: Blood Culture, Routine: 5

## 2015-06-28 MED ORDER — DOCUSATE SODIUM 100 MG PO CAPS
100 MG | Freq: Two times a day (BID) | ORAL | Status: DC
Start: 2015-06-28 — End: 2015-06-29
  Administered 2015-06-28 – 2015-06-29 (×2): 100 mg via ORAL

## 2015-06-28 MED ORDER — OXYCODONE HCL 5 MG PO TABS
5 MG | ORAL | Status: DC | PRN
Start: 2015-06-28 — End: 2015-06-29
  Administered 2015-06-28 – 2015-06-29 (×2): 2.5 mg via ORAL

## 2015-06-28 MED ORDER — PANCRELIPASE (LIP-PROT-AMYL) 12000-38000 UNITS PO CPEP
12000-38000 units | Freq: Three times a day (TID) | ORAL | Status: DC
Start: 2015-06-28 — End: 2015-06-29
  Administered 2015-06-28 – 2015-06-29 (×4): 2 via ORAL

## 2015-06-28 MED ORDER — BISACODYL 10 MG RE SUPP
10 MG | Freq: Once | RECTAL | Status: AC
Start: 2015-06-28 — End: 2015-06-28
  Administered 2015-06-28: 14:00:00 10 mg via RECTAL

## 2015-06-28 MED ORDER — POLYETHYLENE GLYCOL 3350 17 G PO PACK
17 g | Freq: Every day | ORAL | Status: DC
Start: 2015-06-28 — End: 2015-06-29
  Administered 2015-06-28: 13:00:00 17 g via ORAL

## 2015-06-28 MED FILL — CREON 12000-38000 UNITS PO CPEP: 12000-38000 units | ORAL | Qty: 2

## 2015-06-28 MED FILL — PANTOPRAZOLE SODIUM 40 MG PO TBEC: 40 MG | ORAL | Qty: 1

## 2015-06-28 MED FILL — METOPROLOL SUCCINATE ER 25 MG PO TB24: 25 MG | ORAL | Qty: 1

## 2015-06-28 MED FILL — NORMAL SALINE FLUSH 0.9 % IV SOLN: 0.9 % | INTRAVENOUS | Qty: 10

## 2015-06-28 MED FILL — DOCUSATE SODIUM 100 MG PO CAPS: 100 MG | ORAL | Qty: 1

## 2015-06-28 MED FILL — PEG 3350 17 G PO PACK: 17 g | ORAL | Qty: 1

## 2015-06-28 MED FILL — XARELTO 20 MG PO TABS: 20 MG | ORAL | Qty: 1

## 2015-06-28 MED FILL — PIPERACILLIN SOD-TAZOBACTAM SO 3.375 (3-0.375) G IV SOLR: 3.375 (3-0.375) g | INTRAVENOUS | Qty: 3.38

## 2015-06-28 MED FILL — OXYCODONE HCL 5 MG PO TABS: 5 MG | ORAL | Qty: 1

## 2015-06-28 MED FILL — VENLAFAXINE HCL 75 MG PO TABS: 75 MG | ORAL | Qty: 1

## 2015-06-28 MED FILL — BISAC-EVAC 10 MG RE SUPP: 10 MG | RECTAL | Qty: 1

## 2015-06-28 NOTE — Progress Notes (Signed)
[facility]  [facility]  Progress Note    Chief complain : low blood pressure.     SUBJECTIVE:    Patient is awake, alert , tolerating antibiotics well.    ROS:  Constitutional:  denies fever , chills   Cardiovascular: denies chest pain or palpitation   Gastrointestinal: . Denies abdominal pain, diarrhea, no nausea or vomiting  Genitourinary:      Denies  dysuria or burning micturition  Musculoskeletal: no aches or pain   Allergic/Immunologic:  No rash or itching     OBJECTIVE:    Vitals:    06/27/15 2109 06/27/15 2346 06/28/15 0426 06/28/15 0530   BP:  118/62 119/67    Pulse:  82 79    Resp:  20 16    Temp:  98 ??F (36.7 ??C) 97.6 ??F (36.4 ??C)    TempSrc:  Oral Oral    SpO2: 97% 96%     Weight:    211 lb 11.2 oz (96 kg)   Height:           General: AAOx3, No distress.   Heart:  RRR, NS1S2 ,No murmurs, no gallops.  Lungs:  clear to auscultation bilateraly. No labored breathing.  Abdomen: soft, non tender, non distended, No hepato or splenomegaly.   Extremites: some edema, no ulcers.  Skin:  Normal turgor,normal texture. No Janeway Lesions or Osler's nodes.   Eyes:  No sclera icterus, no subconjunctival hemorrhage.      ??? lipase-protease-amylase  4 capsule Oral TID AC   ??? rivaroxaban  20 mg Oral Daily   ??? metoprolol succinate  25 mg Oral BID   ??? pantoprazole  40 mg Oral QAM AC   ??? venlafaxine  37.5 mg Oral Daily   ??? piperacillin-tazobactam  3.375 g Intravenous Q8H   ??? albuterol  2.5 mg Nebulization 4x daily   ??? sodium chloride flush  10 mL Intravenous 2 times per day       LABS    CBC:   Recent Labs      06/26/15   0505  06/27/15   0550  06/28/15   0410   WBC  14.4*  11.0  12.3*   HGB  8.4*  7.8*  8.3*   HCT  25.8*  24.3*  25.5*   PLT  143  146  227       BMP:    Lab Results   Component Value Date    NA 137 06/28/2015    K 3.8 06/28/2015    CL 102 06/28/2015    CO2 23 06/28/2015    BUN 5 06/28/2015    CREATININE 0.6 06/28/2015    GLUCOSE 133 06/28/2015    CALCIUM 8.2 06/28/2015         Hepatic Function Panel:    Lab Results   Component Value Date    ALKPHOS 376 06/25/2015    ALT 30 06/25/2015    AST 94 06/25/2015    PROT 4.5 06/25/2015    BILITOT 3.9 06/25/2015    BILIDIR 3.2 06/23/2015    LABALBU 1.3 06/25/2015     No results found for: SEDRATE    No results found for: CRP      Microbiology :     06/27/2015 10:48 AM - Edi, [facility] Incoming Lab Results   ??   Component Results    ?? Component   ?? Body Fluid Culture, Sterile (Abnormal)   ?? Growth present, evaluating for:   Gram positive  cocci      ?? Gram Stain Result   ?? Refer to ordered Gram stain for results   ?? Organism (Abnormal)   ?? Escherichia coli   ?? Body Fluid Culture, Sterile   ?? Moderate growth   Identification and sensitivity to follow      ?? Organism (Abnormal)   ?? Klebsiella pneumoniae ssp pneumoniae   ?? Body Fluid Culture, Sterile   ?? Moderate growth   Identification and sensitivity to follow      ?? Organism (Abnormal)   ?? Streptococcus species   ?? Body Fluid Culture, Sterile   ?? Heavy growth   Identification and sensitivity to follow      ??   Narrative    ?? Source: FLUID ?? ?? ?? Site: liver abscess ?? ?? ?? ?? ??   ??   Culture & Susceptibility    ?? ESCHERICHIA COLI    ?? Antibiotic Interpretation MIC Unit   ?? Confirmatory Extended Spectrum Beta-Lactamase  Neg mcg/mL   ?? amoxicillin-clavulanate Intermediate =^16 mcg/mL   ?? ampicillin Resistant >=^32 mcg/mL   ?? ceFAZolin Sensitive <=^4 mcg/mL   ?? cefTRIAXone Sensitive <=^1 mcg/mL   ?? cefepime Sensitive <=^1 mcg/mL   ?? gentamicin Sensitive <=^1 mcg/mL   ?? imipenem Sensitive <=^0.25 mcg/mL   ?? levofloxacin Sensitive =^1 mcg/mL   ?? piperacillin-tazobactam Sensitive <=^4 mcg/mL   ?? trimethoprim-sulfamethoxazole Sensitive <=^20 mcg/mL   ??    ??      ?? KLEBSIELLA PNEUMONIAE SSP PNEUMONIAE    ?? Antibiotic Interpretation MIC Unit   ?? Confirmatory Extended Spectrum Beta-Lactamase  Neg mcg/mL   ?? amoxicillin-clavulanate Sensitive =^4 mcg/mL   ?? ampicillin Resistant =^16 mcg/mL   ?? ceFAZolin Sensitive <=^4  mcg/mL   ?? cefTRIAXone Sensitive <=^1 mcg/mL   ?? cefepime Sensitive <=^1 mcg/mL   ?? gentamicin Sensitive <=^1 mcg/mL   ?? imipenem Sensitive <=^0.25 mcg/mL   ?? levofloxacin Sensitive <=^0.12 mcg/mL   ?? piperacillin-tazobactam Sensitive <=^4 mcg/mL   ?? trimethoprim-sulfamethoxazole Sensitive <=^20 mcg/mL   ??    ??             06/25/2015 10:13 AM - Edi, [facility] Incoming Lab Results   ??   Component Results    ?? Component   ?? Body Fluid Culture, Sterile (Abnormal)   ?? Growth present, evaluating for:   Gram positive cocci      ?? Gram Stain Result   ?? Refer to ordered Gram stain for results   ?? Organism (Abnormal)   ?? Escherichia coli   ?? Body Fluid Culture, Sterile   ?? Moderate growth   Identification and sensitivity to follow      ??   Narrative    ?? Source: FLUID ?? ?? ?? Site: liver abscess ?? ?? ?? ?? ??   ??   Culture & Susceptibility    ?? ESCHERICHIA COLI    ?? Antibiotic Interpretation MIC Unit   ?? Confirmatory Extended Spectrum Beta-Lactamase  Neg mcg/mL   ?? amoxicillin-clavulanate Intermediate =^16 mcg/mL   ?? ampicillin Resistant >=^32 mcg/mL   ?? ceFAZolin Sensitive <=^4 mcg/mL   ?? cefTRIAXone Sensitive <=^1 mcg/mL   ?? cefepime Sensitive <=^1 mcg/mL   ?? gentamicin Sensitive <=^1 mcg/mL   ?? imipenem Sensitive <=^0.25 mcg/mL   ?? levofloxacin Sensitive =^1 mcg/mL   ?? piperacillin-tazobactam Sensitive <=^4 mcg/mL   ?? trimethoprim-sulfamethoxazole Sensitive <=^20 mcg/mL   ??    ??      ??  Lab and Collection        No results for input(s): BC in the last 72 hours.  No results for input(s): BLOODCULT2 in the last 72 hours.  No results for input(s): LABURIN in the last 72 hours.  No results for input(s): CULTRESP in the last 72 hours.  No results for input(s): WNDABS in the last 72 hours.    Radiology:   Xr Chest Portable    Result Date: 06/23/2015  Patient MRN:  [id number] DOB: [birth year] Age: 6 years Gender: Male EXAM: XR CHEST PORTABLE INDICATION: Intubation. COMPARISON: 06/23/2015 at 1746 hrs. FINDINGS: Endotracheal tube tip difficult  to visualize but appears to terminate approximately 2 cm above the carina. Nasogastric tube is satisfactory position. Right IJ catheter and left Mediport device unchanged. Mild cardiomegaly with mild perihilar pulmonary vascular congestion. Mild bibasilar atelectasis. Trace pleural fluid collections.     1. Satisfactory position of endotracheal tube. 2. Mild cardiomegaly with pulmonary vascular congestion. 3. Bibasilar atelectasis and small pleural fluid collections.     Xr Chest Portable    Result Date: 06/23/2015  Patient MRN:  [id number] DOB: 1945/09/14 Age: 46 years Gender: Male EXAM: XR CHEST PORTABLE INDICATION:  Shortness of breath.  COMPARISON: 06/22/2015 FINDINGS: Right IJ catheter and left-sided mediport device are unchanged. Mild cardiomegaly. Mild perihilar pulmonary vascular congestion. Bibasilar opacities concerning for developing atelectasis/infiltrates noted. Trace pleural fluid collections. No pneumothorax.     1. Mild cardiomegaly with mild perihilar pulmonary vascular congestion. 2. Trace pleural fluid collection. 3. Mild bibasilar atelectasis.     Xr Chest Portable    Result Date: 06/23/2015  Patient MRN:  [id number] DOB: 10/15/1945 Age: 54 years Gender: Male EXAM: XR CHEST PORTABLE INDICATION: Line placement. COMPARISON: 06/22/2015. FINDINGS: Interval placement right IJ catheter with distal tip at the level of the distal SVC in satisfactory position. Left-sided Mediport device is unchanged. Stable cardiac size. No vascular congestion. Lungs are clear and free of focal airspace consolidation, pleural effusions or pneumothorax.     1. Satisfactory position of right IJ catheter without pneumothorax.     Xr Chest Portable    Result Date: 06/22/2015  Patient MRN:  [id number] DOB: [birth year] Age: 36 years Gender: Male Order Date:  06/22/2015 9:28 PM EXAM: XR CHEST PORTABLE NUMBER OF VIEWS:  1 INDICATION:  pre op  COMPARISON: None FINDINGS: The heart is normal in size. The mediastinum is normal in width. There is  a normal appearance to the pulmonary vasculature. No focal airspace opacity. There is no pleural effusion. There is no pneumothorax. Left-sided Mediport device noted.     No airspace opacities or pleural effusion.     Xr Chest Abdomen Ng Placement    Result Date: 06/23/2015  Patient MRN:  [id number] DOB: [birth year] Age: 52 years Gender: Male Order Date:  06/23/2015 8:28 PM EXAM: XR CHEST ABDOMEN NG PLACEMENT NUMBER OF IMAGES:  1 INDICATION: Orogastric tube COMPARISON: 06/23/2015 2015 hrs. FINDINGS: Nasogastric tube courses below the level of the hemidiaphragm with distal tip located within the region of the body/fundus of the stomach.     Satisfactory position of nasogastric/orogastric tube.     Ct Abscess Fluid Drain Perit / Retroperi    Result Date: 06/23/2015  Patient MRN:  [id number] DOB: 05/25/1945 Age: 36 years Gender: Male Order Date:  06/23/2015 4:40 PM EXAM: CT ABSCESS FLUID DRAIN PERIT / RETROPERIT NUMBER OF IMAGES:  125 INDICATION: 70 year old male with history of pancreatic cancer status post hepaticoduodenostomy, now presenting with an air-containing hepatic collection. Request made  for percutaneous drainage. COMPARISON: Abdominal CT scan performed at outside contusion on 06/22/2015. PROCEDURE: The risks and benefits of the procedure were explained to the patient and the patient willingly signed the consent form allowing the procedure to be performed. With the patient supine on the CT table, and after routine prepping, draping and lidocaine anesthesia, a 10 French locking pigtail catheter was percutaneously inserted into the collection under CT and fluoroscopic guidance. Approximately 20 mL of foul-smelling, dark brown/reddish purulent fluid was aspirated from the lesion. The catheter was then fixated in place as per usual. The patient tolerated the study well and left the CT suite in stable condition.     Technically successful CT-guided liver abscess drainage. Approximately 20 cc's of foul-smelling purulent  fluid was aspirated and sent for the requested testing. Daily drainage catheter output will guide the decision as to how long to leave this drainage catheter in place.         ASSESSMENT:     1. Septic shock: Improved. He is off pressors now.  ??  2. Liver abscess: Culture with E. Coli, Klebsiella pneumoniae and Strep species .For now I would put him back on Zosyn until we have our final culture results. When the drain was placed a 20 mL of foul-smelling purulent fluid was aspirated. Probably there is a component of an anaerobic infection. The E. coli is sensitive to Levaquin.  ??  3. E. coli asymptomatic bacteriuria  ??  4. Colitis: He is not behaving like C. diff colitis.? Ischemic colitis        PLAN: Continue on Zosyn. F/U final culture results . He has a Mediport. Discussed with the 2 daughters at bedside.   Will give final plan when we have the final culture results.          Electronically signed by Oliver HumElias, Dakwan Pridgen, MD on 06/28/2015 at 7:13 AM

## 2015-06-28 NOTE — Progress Notes (Signed)
[facility]  ATTENDING PHYSICIAN PROGRESS NOTE     I have examined the patient, reviewed the record, and discussed the case with the APN/  Resident. I have reviewed all relevant labs and imaging data.    Please refer to the  APN/ resident's note. I agree with the  assessment and plan with the following corrections/ additions. The following summarizes my clinical findings and independent assessment.     CC: follow up for liver abscess    S. Pt feels better . afib resolved    O.  Vitals:    06/28/15 1145   BP: 133/70   Pulse: 85   Resp: 16   Temp: 97.9 ??F (36.6 ??C)   SpO2:      NAD  afib  Lungs clear  Abdomen soft nt nd IR drain in place    ASSESSMENT:  Principal Problem:    Septic shock (HCC)  Active Problems:    Liver abscess    Malignant neoplasm of pancreas (HCC)    Right sided colitis    Hyperbilirubinemia    Jaundice    Lactic acidosis    Palliative care encounter    Acute respiratory failure requiring reintubation (HCC)    PAF (paroxysmal atrial fibrillation) (HCC)       PLAN:  Septic shock resolved  Liver abscess with Perc drain present  Final cx pending  On zosyn  If pt needs IV abx, he has a medport that can be used       Resume Creon for pancreatic insufficiency    Xarelto for RLE DVT--20 mg daily    General diet    afib resolved--cardiology consulted. Toprol 25 bid       No BM since admission--will add bowel regiment and give suppository    DVT Proph: SCDS/ xarelto       Nadean CorwinGregory Graycie Halley, MD, FACS  06/28/2015  11:53 AM

## 2015-06-28 NOTE — Progress Notes (Signed)
GENERAL SURGERY  DAILY PROGRESS NOTE  06/28/2015    Subjective:  No acute issues overnight. Pain is well controlled, tolerating diet, passing flatus    Objective:  Visit Vitals   . BP 119/67   . Pulse 79   . Temp 97.6 F (36.4 C) (Oral)   . Resp 16   . Ht 6' (1.829 m)   . Wt 211 lb 11.2 oz (96 kg)   . SpO2 96%   . BMI 28.71 kg/m2       General appearance: Laying in bed, no acute distress  Lungs: non-labored respirations, nasal cannula in place  Heart: regular rate  Abdomen: soft, nondistended, IR drain in place, minimal output noted at this time    Assessment/Plan:  70 y.o. male Hepatic abscess, septic shock, resolved    Monitor WBC (12.3 this AM )  Continue Zosyn  Ok to continue diet  Continue to monitor drain output  F/U on drain Cx  Creon and Xarelto resumed yesterday      Note signed electronically by Lossie FaesJoseph Nairi Oswald M.D. at 7:44 AM on 06/28/2015

## 2015-06-28 NOTE — Progress Notes (Signed)
Message left with ID office for Dr Alphonsa GinShattahi re: C&S back on body fluid cultures.

## 2015-06-28 NOTE — Plan of Care (Signed)
Problem: Nutrition  Goal: Optimal nutrition therapy  Outcome: Met This Shift

## 2015-06-28 NOTE — Plan of Care (Signed)
Problem: Falls - Risk of  Goal: Absence of falls  Outcome: Met This Shift    Problem: Risk for Impaired Skin Integrity  Goal: Tissue integrity - skin and mucous membranes  Structural intactness and normal physiological function of skin and  mucous membranes.   Outcome: Met This Shift

## 2015-06-29 LAB — CBC WITH AUTO DIFFERENTIAL
Basophils %: 0.6 % (ref 0.0–2.0)
Basophils Absolute: 0 E9/L (ref 0.00–0.20)
Eosinophils %: 0.4 % (ref 0.0–6.0)
Eosinophils Absolute: 0 E9/L — ABNORMAL LOW (ref 0.05–0.50)
Hematocrit: 26.5 % — ABNORMAL LOW (ref 37.0–54.0)
Hemoglobin: 8.8 g/dL — ABNORMAL LOW (ref 12.5–16.5)
Lymphocytes %: 5.2 % — ABNORMAL LOW (ref 20.0–42.0)
Lymphocytes Absolute: 0.7 E9/L — ABNORMAL LOW (ref 1.50–4.00)
MCH: 24.8 pg — ABNORMAL LOW (ref 26.0–35.0)
MCHC: 33.2 % (ref 32.0–34.5)
MCV: 74.6 fL — ABNORMAL LOW (ref 80.0–99.9)
MPV: 10 fL (ref 7.0–12.0)
Monocytes %: 13 % — ABNORMAL HIGH (ref 2.0–12.0)
Monocytes Absolute: 1.81 E9/L — ABNORMAL HIGH (ref 0.10–0.95)
Myelocyte Percent: 1.7 % (ref 0–0)
Neutrophils %: 79.1 % (ref 43.0–80.0)
Neutrophils Absolute: 11.26 E9/L — ABNORMAL HIGH (ref 1.80–7.30)
Platelets: 297 E9/L (ref 130–450)
Promyelocytes Percent: 0.9 % (ref 0–0)
RBC: 3.55 E12/L — ABNORMAL LOW (ref 3.80–5.80)
RDW: 21.7 fL — ABNORMAL HIGH (ref 11.5–15.0)
WBC: 13.9 E9/L — ABNORMAL HIGH (ref 4.5–11.5)

## 2015-06-29 LAB — BASIC METABOLIC PANEL
Anion Gap: 11 mmol/L (ref 7–16)
BUN: 4 mg/dL — ABNORMAL LOW (ref 8–23)
CO2: 25 mmol/L (ref 22–29)
Calcium: 8 mg/dL — ABNORMAL LOW (ref 8.6–10.2)
Chloride: 102 mmol/L (ref 98–107)
Creatinine: 0.6 mg/dL — ABNORMAL LOW (ref 0.7–1.2)
GFR African American: >60
GFR Non-African American: >60 mL/min/{1.73_m2} (ref >=60)
Glucose: 147 mg/dL — ABNORMAL HIGH (ref 74–109)
Potassium: 3.8 mmol/L (ref 3.5–5.0)
Sodium: 138 mmol/L (ref 132–146)

## 2015-06-29 MED ORDER — AMOXICILLIN-POT CLAVULANATE 875-125 MG PO TABS
875-125 MG | Freq: Two times a day (BID) | ORAL | Status: DC
Start: 2015-06-29 — End: 2015-06-29
  Administered 2015-06-29: 15:00:00 1 via ORAL

## 2015-06-29 MED ORDER — AMOXICILLIN-POT CLAVULANATE 875-125 MG PO TABS
875-125 MG | ORAL_TABLET | Freq: Two times a day (BID) | ORAL | 0 refills | Status: AC
Start: 2015-06-29 — End: 2015-08-10

## 2015-06-29 MED ORDER — LEVOFLOXACIN 750 MG PO TABS
750 MG | ORAL_TABLET | Freq: Every day | ORAL | 0 refills | Status: AC
Start: 2015-06-29 — End: 2015-08-10

## 2015-06-29 MED ORDER — MINERAL OIL PO OIL
Freq: Every day | ORAL | Status: DC | PRN
Start: 2015-06-29 — End: 2015-06-29

## 2015-06-29 MED ORDER — SENNOSIDES 8.6 MG PO TABS
8.6 MG | Freq: Every evening | ORAL | Status: DC
Start: 2015-06-29 — End: 2015-06-29

## 2015-06-29 MED ORDER — ALBUTEROL SULFATE (2.5 MG/3ML) 0.083% IN NEBU
Freq: Four times a day (QID) | RESPIRATORY_TRACT | 3 refills | Status: AC
Start: 2015-06-29 — End: ?

## 2015-06-29 MED ORDER — METOPROLOL SUCCINATE ER 25 MG PO TB24
25 MG | ORAL_TABLET | Freq: Two times a day (BID) | ORAL | 2 refills | Status: AC
Start: 2015-06-29 — End: ?

## 2015-06-29 MED ORDER — OXYCODONE HCL 5 MG PO TABS
5 MG | ORAL_TABLET | Freq: Four times a day (QID) | ORAL | 0 refills | Status: AC | PRN
Start: 2015-06-29 — End: 2015-07-06

## 2015-06-29 MED ORDER — LEVOFLOXACIN 750 MG PO TABS
750 MG | Freq: Every day | ORAL | Status: DC
Start: 2015-06-29 — End: 2015-06-29
  Administered 2015-06-29: 15:00:00 750 mg via ORAL

## 2015-06-29 MED FILL — NORMAL SALINE FLUSH 0.9 % IV SOLN: 0.9 % | INTRAVENOUS | Qty: 10

## 2015-06-29 MED FILL — PIPERACILLIN SOD-TAZOBACTAM SO 3.375 (3-0.375) G IV SOLR: 3.375 (3-0.375) g | INTRAVENOUS | Qty: 3.38

## 2015-06-29 MED FILL — PANTOPRAZOLE SODIUM 40 MG PO TBEC: 40 MG | ORAL | Qty: 1

## 2015-06-29 MED FILL — AMOXICILLIN-POT CLAVULANATE 875-125 MG PO TABS: 875-125 MG | ORAL | Qty: 1

## 2015-06-29 MED FILL — MINERAL OIL PO OIL: ORAL | Qty: 30

## 2015-06-29 MED FILL — LEVOFLOXACIN 750 MG PO TABS: 750 MG | ORAL | Qty: 1

## 2015-06-29 MED FILL — NORMAL SALINE FLUSH 0.9 % IV SOLN: 0.9 % | INTRAVENOUS | Qty: 20

## 2015-06-29 MED FILL — OXYCODONE HCL 5 MG PO TABS: 5 MG | ORAL | Qty: 1

## 2015-06-29 MED FILL — DOCUSATE SODIUM 100 MG PO CAPS: 100 MG | ORAL | Qty: 1

## 2015-06-29 NOTE — Progress Notes (Signed)
Palliative Medicine  Progress Note    Patient GOC and code status have been well addressed.  The patient is for discharge to [facility] once antibiotics are finalized.  ID is managing the transition to oral antibiotics.  There are no further PM needs at this time.  PM will now sign off.  If new PM needs arise, please re-consult.  Thank you.    Hulen LusterAlan J Abra Lingenfelter RN, MSN, FNP-BC  Palliative Medicine

## 2015-06-29 NOTE — Care Coordination-Inpatient (Signed)
SOCIAL WORK AND DISCHARGE PLANNING: MET WITH Pt, WIFE AND DAUGHTER. THEY WANT THE [facility] IN [place] ,[place]. I TOLD THEM HE WILL HAVE TO GO BY CAR VIA FAMILY. TENTATIVE DISCHARGE TODAY. CALLED REP AND SHE ONLY HAS ONE BED LEFT AND WILL ASSESS Pt. SHE WILL DO THE [place] [facility] FOR [place] IF HE COMES THERE. IF NOT A BED THERE THEN FAMILY IN AGREEMENT TO THE [place] LOCATION. THE ENVELOPE IS IN THE SOFT CHART WITH N17 IN EPIC. Marland KitchenSharlee Blew  06/29/2015    ACCEPTED TO THE [facility] AT [facility] IN [place], [place].  Pt CAN GO TODAY. FAMILY TO TRANSPORT . N17 IS IN EPIC. SNF/LOC. I TOLD THE FACILITY REP THAT Pt WILL ARRIVE BY 5 P.M. .Sharlee Blew  06/29/2015

## 2015-06-29 NOTE — Discharge Instructions (Signed)
Good nutrition is important when healing from an illness, injury, or surgery. Follow any nutrition recommendations given to you during the hospital stay. If you are instructed to take an oral nutrition supplement at home, you can take it with meals, in-between meals, and/or before bedtime. These supplements can be purchased at most local grocery stores, pharmacies, and chain super-stores. If you need help in covering the cost of your medication or oral supplement, visit www.RxAssist.org. If you have any questions about your diet or nutrition, call the hospital and ask for the dietitian.       Your nutrition orders at the time of discharge were:  DIET GENERAL;  Dietary Nutrition Supplements: Frozen Oral Supplement.

## 2015-06-29 NOTE — Discharge Summary (Signed)
Physician Discharge Summary     Arthur Soxhilip Rowton  [id number]    Admit date: 06/22/2015    Discharge date and time: 06/29/2015  4:11 PM    Admitting Physician: Mable FillKenneth J Ransom, MD     Admission Diagnoses:   Patient Active Problem List   Diagnosis   ??? Septic shock (HCC)   ??? Liver abscess   ??? Malignant neoplasm of pancreas (HCC)   ??? Right sided colitis   ??? Hyperbilirubinemia   ??? Jaundice   ??? Lactic acidosis   ??? Palliative care encounter   ??? Acute respiratory failure requiring reintubation (HCC)   ??? PAF (paroxysmal atrial fibrillation) [facility](HCC)       Discharge Diagnoses:   Patient Active Problem List   Diagnosis   ??? Septic shock (HCC)   ??? Liver abscess   ??? Malignant neoplasm of pancreas (HCC)   ??? Right sided colitis   ??? Hyperbilirubinemia   ??? Jaundice   ??? Lactic acidosis   ??? Palliative care encounter   ??? Acute respiratory failure requiring reintubation (HCC)   ??? PAF (paroxysmal atrial fibrillation) [facility](HCC)         Hospital Course: Arthur Hunt is a 70 y.o. male who presented for evaluation of septic shock after being found hypotensive at home by his home health nurse. [facility] CT showed a hepatic abscess with concern for possible portal air and possible ischemic colitis with intramural air. Patient was taken for IR procedure for drainage then developed acute respiratory failure after the procedure requiring intubation. Patient was treated with zosyn for the liver abscess after receipt of final cultures.  He stabilized over the next couple of days and was able to be extubated without event. He was stabilized on the floor and eventually determined stable for discharged to SAR.    Lab Results   Component Value Date    WBC 13.9 06/29/2015    HGB 8.8 06/29/2015    PLT 297 06/29/2015    NA 138 06/29/2015    CL 102 06/29/2015    K 3.8 06/29/2015    BUN 4 06/29/2015    CREATININE 0.6 06/29/2015    GLUCOSE 147 06/29/2015    LABGLOM >60 06/29/2015    PROTIME 15.9  06/24/2015    INR 1.4 06/24/2015    LABALBU 1.3 06/25/2015    PROT 4.5 06/25/2015    CALCIUM 8.0 06/29/2015    MG 1.8 06/25/2015    PHOS 3.0 06/25/2015    BILITOT 3.9 06/25/2015    BILIDIR 3.2 06/23/2015    ALKPHOS 376 06/25/2015    AST 94 06/25/2015    ALT 30 06/25/2015       Discharge Exam:   VITALS: BP 125/66   Pulse 80   Temp 98 ??F (36.7 ??C) (Oral)    Resp 18   Ht 6' (1.829 m)   Wt 209 lb (94.8 kg)   SpO2 97%   BMI 28.35 kg/m2    General appearance: Laying in bed, no acute distress  Lungs: non-labored respirations, nasal cannula in place  Heart: regular rate  Abdomen: soft, nondistended, IR drain in place, minimal output noted at this time- serous    Disposition: SAR       Medication List      START taking these medications          albuterol (2.5 MG/3ML) 0.083% nebulizer solution   Commonly known as:  PROVENTIL  Take 3 mLs by nebulization 4 times daily       metoprolol succinate 25 MG extended release tablet   Commonly known as:  TOPROL XL   Take 1 tablet by mouth 2 times daily         CONTINUE taking these medications          lisinopril 30 MG tablet   Commonly known as:  PRINIVIL;ZESTRIL       magnesium oxide 400 MG tablet   Commonly known as:  MAG-OX       metFORMIN 500 MG tablet   Commonly known as:  GLUCOPHAGE       NOVOLIN R IJ       ondansetron 8 MG tablet   Commonly known as:  ZOFRAN       Pancrelipase (Lip-Prot-Amyl) 36000 UNITS Cpep       pantoprazole 20 MG tablet   Commonly known as:  PROTONIX       prochlorperazine 10 MG tablet   Commonly known as:  COMPAZINE       rivaroxaban 20 MG Tabs tablet   Commonly known as:  XARELTO       venlafaxine 37.5 MG tablet   Commonly known as:  EFFEXOR         ASK your doctor about these medications          amoxicillin-clavulanate 875-125 MG per tablet   Commonly known as:  AUGMENTIN   Take 1 tablet by mouth every 12 hours   Ask about: Should I take this medication?       levofloxacin 750 MG tablet   Commonly known as:  LEVAQUIN   Take 1 tablet by mouth daily    Ask about: Should I take this medication?       oxyCODONE 5 MG immediate release tablet   Commonly known as:  ROXICODONE   Take 1 tablet by mouth every 6 hours as needed for Pain .   Ask about: Should I take this medication?            Where to Get Your Medications      You can get these medications from any pharmacy     Bring a paper prescription for each of these medications    ??? albuterol (2.5 MG/3ML) 0.083% nebulizer solution   ??? amoxicillin-clavulanate 875-125 MG per tablet   ??? levofloxacin 750 MG tablet   ??? metoprolol succinate 25 MG extended release tablet   ??? oxyCODONE 5 MG immediate release tablet             Patient Instructions:     Activity: activity as tolerated  Diet: regular diet  Wound Care: shower and leave the incisions open to air     Follow-up with general surgery clinic in 2 weeks or as needed.    Signed:  Antonietta Barcelona Margueritte Guthridge  09/17/2015  11:54 PM

## 2015-06-29 NOTE — Progress Notes (Signed)
[facility]  [facility]  Progress Note    Chief complain : low blood pressure.     SUBJECTIVE:    Patient is awake, alert , tolerating antibiotics well.    ROS:  Constitutional:  denies fever , chills   Cardiovascular: denies chest pain or palpitation   Gastrointestinal: . Denies abdominal pain, diarrhea, no nausea or vomiting  Genitourinary:      Denies  dysuria or burning micturition  Musculoskeletal: no aches or pain   Allergic/Immunologic:  No rash or itching     OBJECTIVE:    Vitals:    06/28/15 1952 06/29/15 0000 06/29/15 0415 06/29/15 0732   BP: 134/68 126/69  134/77   Pulse: 82 79  72   Resp: 20 20  18    Temp: 97.9 ??F (36.6 ??C) 97.8 ??F (36.6 ??C)  97.9 ??F (36.6 ??C)   TempSrc: Oral   Oral   SpO2:  94% 95% 96%   Weight:   209 lb (94.8 kg)    Height:           General: AAOx3, No distress.   Heart:  RRR, NS1S2 ,No murmurs, no gallops.  Lungs:  clear to auscultation bilateraly. No labored breathing.  Abdomen: soft, non tender, non distended, No hepato or splenomegaly. Still have that draining tube in (right upper quadrant) with a small amount of yellowish fluid in the back  Extremites: some edema, no ulcers.  Skin:  Normal turgor,normal texture. No Janeway Lesions or Osler's nodes.   Eyes:  No sclera icterus, no subconjunctival hemorrhage.      ??? senna  1 tablet Oral Nightly   ??? lipase-protease-amylase  2 capsule Oral TID AC   ??? docusate sodium  100 mg Oral BID   ??? polyethylene glycol  17 g Oral Daily   ??? rivaroxaban  20 mg Oral Daily   ??? metoprolol succinate  25 mg Oral BID   ??? pantoprazole  40 mg Oral QAM AC   ??? venlafaxine  37.5 mg Oral Daily   ??? piperacillin-tazobactam  3.375 g Intravenous Q8H   ??? albuterol  2.5 mg Nebulization 4x daily   ??? sodium chloride flush  10 mL Intravenous 2 times per day       LABS    CBC:   Recent Labs      06/27/15   0550  06/28/15   0410  06/29/15   0405   WBC  11.0  12.3*  13.9*   HGB  7.8*  8.3*  8.8*   HCT  24.3*  25.5*  26.5*   PLT  146  227   297       BMP:    Lab Results   Component Value Date    NA 138 06/29/2015    K 3.8 06/29/2015    CL 102 06/29/2015    CO2 25 06/29/2015    BUN 4 06/29/2015    CREATININE 0.6 06/29/2015    GLUCOSE 147 06/29/2015    CALCIUM 8.0 06/29/2015        Hepatic Function Panel:    Lab Results   Component Value Date    ALKPHOS 376 06/25/2015    ALT 30 06/25/2015    AST 94 06/25/2015    PROT 4.5 06/25/2015    BILITOT 3.9 06/25/2015    BILIDIR 3.2 06/23/2015    LABALBU 1.3 06/25/2015     No results found for: SEDRATE    No results found for: CRP  Microbiology :     06/27/2015 10:48 AM - Edi, [facility] Incoming Lab Results   ??   Component Results    ?? Component   ?? Body Fluid Culture, Sterile (Abnormal)   ?? Growth present, evaluating for:   Gram positive cocci      ?? Gram Stain Result   ?? Refer to ordered Gram stain for results   ?? Organism (Abnormal)   ?? Escherichia coli   ?? Body Fluid Culture, Sterile   ?? Moderate growth   Identification and sensitivity to follow      ?? Organism (Abnormal)   ?? Klebsiella pneumoniae ssp pneumoniae   ?? Body Fluid Culture, Sterile   ?? Moderate growth   Identification and sensitivity to follow      ?? Organism (Abnormal)   ?? Streptococcus species   ?? Body Fluid Culture, Sterile   ?? Heavy growth   Identification and sensitivity to follow      ??   Narrative    ?? Source: FLUID ?? ?? ?? Site: liver abscess ?? ?? ?? ?? ??   ??   Culture & Susceptibility    ?? ESCHERICHIA COLI    ?? Antibiotic Interpretation MIC Unit   ?? Confirmatory Extended Spectrum Beta-Lactamase  Neg mcg/mL   ?? amoxicillin-clavulanate Intermediate =^16 mcg/mL   ?? ampicillin Resistant >=^32 mcg/mL   ?? ceFAZolin Sensitive <=^4 mcg/mL   ?? cefTRIAXone Sensitive <=^1 mcg/mL   ?? cefepime Sensitive <=^1 mcg/mL   ?? gentamicin Sensitive <=^1 mcg/mL   ?? imipenem Sensitive <=^0.25 mcg/mL   ?? levofloxacin Sensitive =^1 mcg/mL   ?? piperacillin-tazobactam Sensitive <=^4 mcg/mL   ?? trimethoprim-sulfamethoxazole Sensitive <=^20 mcg/mL   ??    ??      ?? KLEBSIELLA  PNEUMONIAE SSP PNEUMONIAE    ?? Antibiotic Interpretation MIC Unit   ?? Confirmatory Extended Spectrum Beta-Lactamase  Neg mcg/mL   ?? amoxicillin-clavulanate Sensitive =^4 mcg/mL   ?? ampicillin Resistant =^16 mcg/mL   ?? ceFAZolin Sensitive <=^4 mcg/mL   ?? cefTRIAXone Sensitive <=^1 mcg/mL   ?? cefepime Sensitive <=^1 mcg/mL   ?? gentamicin Sensitive <=^1 mcg/mL   ?? imipenem Sensitive <=^0.25 mcg/mL   ?? levofloxacin Sensitive <=^0.12 mcg/mL   ?? piperacillin-tazobactam Sensitive <=^4 mcg/mL   ?? trimethoprim-sulfamethoxazole Sensitive <=^20 mcg/mL   ??    ??             06/25/2015 10:13 AM - Edi, [facility] Incoming Lab Results   ??   Component Results    ?? Component   ?? Body Fluid Culture, Sterile (Abnormal)   ?? Growth present, evaluating for:   Gram positive cocci      ?? Gram Stain Result   ?? Refer to ordered Gram stain for results   ?? Organism (Abnormal)   ?? Escherichia coli   ?? Body Fluid Culture, Sterile   ?? Moderate growth   Identification and sensitivity to follow      ??   Narrative    ?? Source: FLUID ?? ?? ?? Site: liver abscess ?? ?? ?? ?? ??   ??   Culture & Susceptibility    ?? ESCHERICHIA COLI    ?? Antibiotic Interpretation MIC Unit   ?? Confirmatory Extended Spectrum Beta-Lactamase  Neg mcg/mL   ?? amoxicillin-clavulanate Intermediate =^16 mcg/mL   ?? ampicillin Resistant >=^32 mcg/mL   ?? ceFAZolin Sensitive <=^4 mcg/mL   ?? cefTRIAXone Sensitive <=^1 mcg/mL   ?? cefepime Sensitive <=^1 mcg/mL   ?? gentamicin  Sensitive <=^1 mcg/mL   ?? imipenem Sensitive <=^0.25 mcg/mL   ?? levofloxacin Sensitive =^1 mcg/mL   ?? piperacillin-tazobactam Sensitive <=^4 mcg/mL   ?? trimethoprim-sulfamethoxazole Sensitive <=^20 mcg/mL   ??    ??      ??   Lab and Collection             ?? STREPTOCOCCUS ANGINOSUS    ?? Antibiotic Interpretation MIC Unit   ?? ampicillin Sensitive <=^0.25 mcg/mL   ?? benzylpenicillin Intermediate =^0.12 mcg/mL   ?? cefTRIAXone Sensitive =^1 mcg/mL   ?? cefotaxime Sensitive =^0.5 mcg/mL   ?? clindamycin Sensitive <=^0.25 mcg/mL   ??  levofloxacin Sensitive <=^0.25 mcg/mL   ?? tigecycline Sensitive <=^0.06 mcg/mL   ?? vancomycin Sensitive =^0.5 mcg/mL   ??    ??      ??   Lab and Collection          No results for input(s): BC in the last 72 hours.  No results for input(s): BLOODCULT2 in the last 72 hours.  No results for input(s): LABURIN in the last 72 hours.  No results for input(s): CULTRESP in the last 72 hours.  No results for input(s): WNDABS in the last 72 hours.    Radiology:   Xr Chest Portable    Result Date: 06/23/2015  Patient MRN:  [id number] DOB: 10-26-1945 Age: 70 years Gender: Male EXAM: XR CHEST PORTABLE INDICATION: Intubation. COMPARISON: 06/23/2015 at 1746 hrs. FINDINGS: Endotracheal tube tip difficult to visualize but appears to terminate approximately 2 cm above the carina. Nasogastric tube is satisfactory position. Right IJ catheter and left Mediport device unchanged. Mild cardiomegaly with mild perihilar pulmonary vascular congestion. Mild bibasilar atelectasis. Trace pleural fluid collections.     1. Satisfactory position of endotracheal tube. 2. Mild cardiomegaly with pulmonary vascular congestion. 3. Bibasilar atelectasis and small pleural fluid collections.     Xr Chest Portable    Result Date: 06/23/2015  Patient MRN:  [id number] DOB: Jul 24, 1945 Age: 42 years Gender: Male EXAM: XR CHEST PORTABLE INDICATION:  Shortness of breath.  COMPARISON: 06/22/2015 FINDINGS: Right IJ catheter and left-sided mediport device are unchanged. Mild cardiomegaly. Mild perihilar pulmonary vascular congestion. Bibasilar opacities concerning for developing atelectasis/infiltrates noted. Trace pleural fluid collections. No pneumothorax.     1. Mild cardiomegaly with mild perihilar pulmonary vascular congestion. 2. Trace pleural fluid collection. 3. Mild bibasilar atelectasis.     Xr Chest Portable    Result Date: 06/23/2015  Patient MRN:  [id number] DOB: Aug 28, 1945 Age: 63 years Gender: Male EXAM: XR CHEST PORTABLE INDICATION: Line placement. COMPARISON:  06/22/2015. FINDINGS: Interval placement right IJ catheter with distal tip at the level of the distal SVC in satisfactory position. Left-sided Mediport device is unchanged. Stable cardiac size. No vascular congestion. Lungs are clear and free of focal airspace consolidation, pleural effusions or pneumothorax.     1. Satisfactory position of right IJ catheter without pneumothorax.     Xr Chest Portable    Result Date: 06/22/2015  Patient MRN:  [id number] DOB: Apr 10, 1946 Age: 89 years Gender: Male Order Date:  06/22/2015 9:28 PM EXAM: XR CHEST PORTABLE NUMBER OF VIEWS:  1 INDICATION:  pre op  COMPARISON: None FINDINGS: The heart is normal in size. The mediastinum is normal in width. There is a normal appearance to the pulmonary vasculature. No focal airspace opacity. There is no pleural effusion. There is no pneumothorax. Left-sided Mediport device noted.     No airspace opacities or pleural effusion.     Xr Chest  Abdomen Ng Placement    Result Date: 06/23/2015  Patient MRN:  [id number] DOB: May 06, 1945 Age: 58 years Gender: Male Order Date:  06/23/2015 8:28 PM EXAM: XR CHEST ABDOMEN NG PLACEMENT NUMBER OF IMAGES:  1 INDICATION: Orogastric tube COMPARISON: 06/23/2015 2015 hrs. FINDINGS: Nasogastric tube courses below the level of the hemidiaphragm with distal tip located within the region of the body/fundus of the stomach.     Satisfactory position of nasogastric/orogastric tube.     Ct Abscess Fluid Drain Perit / Retroperi    Result Date: 06/23/2015  Patient MRN:  [id number] DOB: [birth year] Age: 1 years Gender: Male Order Date:  06/23/2015 4:40 PM EXAM: CT ABSCESS FLUID DRAIN PERIT / RETROPERIT NUMBER OF IMAGES:  125 INDICATION: 70 year old male with history of pancreatic cancer status post hepaticoduodenostomy, now presenting with an air-containing hepatic collection. Request made for percutaneous drainage. COMPARISON: Abdominal CT scan performed at outside contusion on 06/22/2015. PROCEDURE: The risks and benefits of the  procedure were explained to the patient and the patient willingly signed the consent form allowing the procedure to be performed. With the patient supine on the CT table, and after routine prepping, draping and lidocaine anesthesia, a 10 French locking pigtail catheter was percutaneously inserted into the collection under CT and fluoroscopic guidance. Approximately 20 mL of foul-smelling, dark brown/reddish purulent fluid was aspirated from the lesion. The catheter was then fixated in place as per usual. The patient tolerated the study well and left the CT suite in stable condition.     Technically successful CT-guided liver abscess drainage. Approximately 20 cc's of foul-smelling purulent fluid was aspirated and sent for the requested testing. Daily drainage catheter output will guide the decision as to how long to leave this drainage catheter in place.         ASSESSMENT:     1. Septic shock: Improved. He is off pressors now.  ??  2. Liver abscess: Culture with E. Coli, Klebsiella pneumoniae and Strep species .For now I would put him back on Zosyn until we have our final culture results. When the drain was placed a 20 mL of foul-smelling purulent fluid was aspirated. Probably there is a component of an anaerobic infection. All bacteria as are sensitive to Levaquin but this is a polymicrobial infection and the source is a got so am anticipating there are some anaerobes that did not grow.  ??  3. E. coli asymptomatic bacteriuria  ??  4. Colitis: He is not behaving like C. diff colitis.? Ischemic colitis        PLAN: He will did received 1 week of IV Zosyn and source control is at least partially done. He is afebrile. He still has a drain in. I feel comfortable changing him to oral antibiotic in the form of Levaquin plus Augmentin which he needs to be on for about 6 weeks. Before in the antibiotics we need to obtain a CAT scan to make sure that all the infections gone.  Discussed with the patient and his family members  at bedside. We talked about side effects of antibiotics. He is agreeable. He will let me know if he develops any. Discussed with surgery.      Electronically signed by Oliver Hum, MD on 06/29/2015 at 9:37 AM

## 2015-06-29 NOTE — Progress Notes (Signed)
Physical Therapy    [facility]  Rx Note  NAME: Arthur Hunt  DOB: 09/28/1945  MRN: [id number]    Date of Service: 06/29/2015    Evaluating Therapist: Levada DyEric Haybarger, PT, DPT    Recommendation for discharge is [facility]  AM-PAC Basic Mobility Inpatient Short Form: 10/20    Room #: 6510/6510-A  DIAGNOSIS: Bowel Infarct, Liver Abscess   PRECAUTIONS: Fall Risk, IR Drain, Hx of pancreatic cancer    Social:  Pt lives with wife, who is able to assist, in a bi-level floor plan with 1 step and 0 rails to enter.  Pt reports being having a bed/bath on main floor.  Prior to admission pt walked with no AD and Independent with ADL's.     Initial Evaluation  Date: 06/26/15 Treatment  Date:  Short Term/ Long Term   Goals   Was pt agreeable to Eval/treatment? Yes yes    Does pt have pain? Pt c/o abdominal pain but no rating given No co pain laying in bed     Bed Mobility  Rolling: Mod A  Supine to sit: Mod A  Sit to supine: NT  Scooting: NT  supine to sit mod  Sit to supine mod assist  Scooting mod   Rolling mod assist  Cues for technique Min A   Transfers Sit to stand: Mod A  Stand to sit: Mod A  Stand pivot: Mod A with [employer] Sit to stand min  Stand to sit min  Stand pivot with ww min Min A with [employer]   Ambulation   10 feet x 2 with Mod A with [employer] 60 feet ww min assist >50 feet with Min A with [employer]   Stair negotiation: ascended and descended NT na >2 steps with 1 rail with ModA   BLE ROM WNL na    BLE strength 4-/5 na 4/5   Balance Sitting: SBA   Standing: Mod A with [employer] Sitting sba  Standing with ww dynamic  Min  Sitting: Independent   Standing: Min A with [employer]     Pt is alert and oriented x 3  BLE ROM is nt  BLE Strength is grossly nt  Balance: with ww min    Pt performed therapeutic exercise of the following:  function    Patient education  Pt was educated on  Set designerCar transfer    Patient response to education:   Pt verbalized understanding Pt demonstrated skill Pt requires further education in this area   x x x     Additional  Comments: verbally reviewed car transfer and partial simulated demonstration on sitting into car using the bed.    Pt family will be transporting pt for rehab in their [place].   Told family to have facility get pt out of the car.          Pt was left in bed with call light left by patient.    Chair/bed alarm: nt    Time in: 1120  Time out: 1135    Pt is making  progress toward established Physical Therapy goals.  Continue with physical therapy current plan of care.    Janey GentaLinda Lucely Leard, P.T.A.  License Number: [id number] [phone]

## 2015-06-29 NOTE — Progress Notes (Signed)
[facility]  ATTENDING PHYSICIAN PROGRESS NOTE     I have examined the patient, reviewed the record, and discussed the case with the APN/  Resident. I have reviewed all relevant labs and imaging data.    Please refer to the  APN/ resident's note. I agree with the  assessment and plan with the following corrections/ additions. The following summarizes my clinical findings and independent assessment.     CC: follow up for liver abscess    S. Pt feels better . He had a BM yesterday    O.   comfortable  afib  Lungs clear  Abdomen soft nt nd IR drain in place    ASSESSMENT:  Principal Problem:    Septic shock (HCC)  Active Problems:    Liver abscess    Malignant neoplasm of pancreas (HCC)    Right sided colitis    Hyperbilirubinemia    Jaundice    Lactic acidosis    Palliative care encounter    Acute respiratory failure requiring reintubation (HCC)    PAF (paroxysmal atrial fibrillation) (HCC)       PLAN:  Septic shock resolved  Liver abscess with Perc drain present  Final cx    Discussed with ID transition to oral abx x 6 weeks    Resume Creon for pancreatic insufficiency    Xarelto for RLE DVT--20 mg daily    General diet    afib resolved--cardiology consulted. Toprol 25 bid            DVT Proph: SCDS/ xarelto     DC to [facility] today    Nadean CorwinGregory Fordyce Lepak, MD, FACS  06/29/2015  3:20 PM

## 2015-06-29 NOTE — Other (Addendum)
Patient Acct Nbr:  [id number]  Primary AUTH/CERT:    Primary Insurance Company Name:   MEDICARE  Primary Insurance Plan Name:  MEDICARE  Primary Insurance Group Number:    [facility] Plan Type: [facility]  Primary Insurance Policy Number:  [id number]    Secondary AUTH/CERT:    Secondary Insurance Company Name:   MEDICARE(NON-HMO)SUPPLEMENTAL  Secondary Insurance Plan Name:  [facility]  Secondary Insurance Group Number:  PLAN [id number]  Secondary Insurance Plan TypeChristell Constant: C  Secondary Insurance Policy Number:  [id number]    Tertiary AUTH/CERT:    [employer]iary Insurance Company Name:   COMMERCIAL P-Z  [employer]iary Insurance Plan Name:  [employer]  Tertiary Insurance Group Number:    [facility] Bell[facility]iary Insurance Plan Type: C  Sara Leeertiary Insurance Policy Number:  [id number]

## 2015-06-29 NOTE — Plan of Care (Signed)
Problem: Falls - Risk of  Goal: Absence of falls  Outcome: Met This Shift    Problem: Risk for Impaired Skin Integrity  Goal: Tissue integrity - skin and mucous membranes  Structural intactness and normal physiological function of skin and  mucous membranes.   Outcome: Ongoing

## 2015-06-29 NOTE — Plan of Care (Signed)
Problem: Falls - Risk of  Goal: Absence of falls  Outcome: Met This Shift    Problem: Nutrition  Goal: Optimal nutrition therapy  Outcome: Met This Shift

## 2015-06-29 NOTE — Progress Notes (Signed)
GENERAL SURGERY  DAILY PROGRESS NOTE  06/29/2015    Subjective:  No acute issues overnight. Pain is well controlled, tolerating diet, passing flatus    Objective:  Visit Vitals   ??? BP 126/69   ??? Pulse 79   ??? Temp 97.8 ??F (36.6 ??C)   ??? Resp 20   ??? Ht 6' (1.829 m)   ??? Wt 209 lb (94.8 kg)   ??? SpO2 95%   ??? BMI 28.35 kg/m2       General appearance: Laying in bed, no acute distress  Lungs: non-labored respirations, nasal cannula in place  Heart: regular rate  Abdomen: soft, nondistended, IR drain in place, minimal output noted at this time- serous    Assessment/Plan:  70 y.o. male Hepatic abscess, septic shock, resolved    Monitor WBC (13.9 from 12.3 this AM )  Continue Zosyn  Ok to continue diet  Continue to monitor drain output  F/U on drain Cx- pansensitive to levaquin, cultures finalized. Patient does have port and is going to [facility]. Awaiting final ID recommendations for discharge  [facility]- no precert needed, possible dc today if antibiotics are finalized.     Note signed electronically by Benson Settingachel Fiddler DO. at 6:01 AM on 06/29/2015

## 2015-07-07 ENCOUNTER — Inpatient Hospital Stay: Attending: Diagnostic Radiology

## 2015-08-20 ENCOUNTER — Ambulatory Visit (INDEPENDENT_AMBULATORY_CARE_PROVIDER_SITE_OTHER): Payer: Medicare Other | Admitting: Internal Medicine

## 2015-08-20 ENCOUNTER — Encounter: Payer: Self-pay | Admitting: Internal Medicine

## 2015-08-20 VITALS — BP 164/86 | HR 90 | Temp 98.2°F | Resp 18 | Ht 67.75 in

## 2015-08-20 DIAGNOSIS — L259 Unspecified contact dermatitis, unspecified cause: Secondary | ICD-10-CM | POA: Diagnosis not present

## 2015-08-20 MED ORDER — PREDNISONE 20 MG PO TABS: ORAL_TABLET | ORAL | Status: DC

## 2015-08-20 MED ORDER — TRIAMCINOLONE ACETONIDE 0.1 % EX CREA: 1.0000 "application " | TOPICAL_CREAM | Freq: Four times a day (QID) | CUTANEOUS | Status: DC

## 2015-08-20 MED ORDER — TAMSULOSIN HCL 0.4 MG PO CAPS: 0.4000 mg | ORAL_CAPSULE | Freq: Every day | ORAL | Status: DC

## 2015-08-20 NOTE — Patient Instructions (Signed)
Contact Dermatitis Dermatitis is redness, soreness, and swelling (inflammation) of the skin. Contact dermatitis is a reaction to certain substances that touch the skin. There are two types of contact dermatitis:   Irritant contact dermatitis. This type is caused by something that irritates your skin, such as dry hands from washing them too much. This type does not require previous exposure to the substance for a reaction to occur. This type is more common.  Allergic contact dermatitis. This type is caused by a substance that you are allergic to, such as a nickel allergy or poison ivy. This type only occurs if you have been exposed to the substance (allergen) before. Upon a repeat exposure, your body reacts to the substance. This type is less common. CAUSES  Many different substances can cause contact dermatitis. Irritant contact dermatitis is most commonly caused by exposure to:   Makeup.   Soaps.   Detergents.   Bleaches.   Acids.   Metal salts, such as nickel.  Allergic contact dermatitis is most commonly caused by exposure to:   Poisonous plants.   Chemicals.   Jewelry.   Latex.   Medicines.   Preservatives in products, such as clothing.  RISK FACTORS This condition is more likely to develop in:   People who have jobs that expose them to irritants or allergens.  People who have certain medical conditions, such as asthma or eczema.  SYMPTOMS  Symptoms of this condition may occur anywhere on your body where the irritant has touched you or is touched by you. Symptoms include:  Dryness or flaking.   Redness.   Cracks.   Itching.   Pain or a burning feeling.   Blisters.  Drainage of small amounts of blood or clear fluid from skin cracks. With allergic contact dermatitis, there may also be swelling in areas such as the eyelids, mouth, or genitals.  DIAGNOSIS  This condition is diagnosed with a medical history and physical exam. A patch skin test  may be performed to help determine the cause. If the condition is related to your job, you may need to see an occupational medicine specialist. TREATMENT Treatment for this condition includes figuring out what caused the reaction and protecting your skin from further contact. Treatment may also include:   Steroid creams or ointments. Oral steroid medicines may be needed in more severe cases.  Antibiotics or antibacterial ointments, if a skin infection is present.  Antihistamine lotion or an antihistamine taken by mouth to ease itching.  A bandage (dressing). HOME CARE INSTRUCTIONS Skin Care  Moisturize your skin as needed.   Apply cool compresses to the affected areas.  Try taking a bath with:  Epsom salts. Follow the instructions on the packaging. You can get these at your local pharmacy or grocery store.  Baking soda. Pour a small amount into the bath as directed by your health care provider.  Colloidal oatmeal. Follow the instructions on the packaging. You can get this at your local pharmacy or grocery store.  Try applying baking soda paste to your skin. Stir water into baking soda until it reaches a paste-like consistency.  Do not scratch your skin.  Bathe less frequently, such as every other day.  Bathe in lukewarm water. Avoid using hot water. Medicines  Take or apply over-the-counter and prescription medicines only as told by your health care provider.   If you were prescribed an antibiotic medicine, take or apply your antibiotic as told by your health care provider. Do not stop using the   antibiotic even if your condition starts to improve. General Instructions  Keep all follow-up visits as told by your health care provider. This is important.  Avoid the substance that caused your reaction. If you do not know what caused it, keep a journal to try to track what caused it. Write down:  What you eat.  What cosmetic products you use.  What you drink.  What  you wear in the affected area. This includes jewelry.  If you were given a dressing, take care of it as told by your health care provider. This includes when to change and remove it. SEEK MEDICAL CARE IF:   Your condition does not improve with treatment.  Your condition gets worse.  You have signs of infection such as swelling, tenderness, redness, soreness, or warmth in the affected area.  You have a fever.  You have new symptoms. SEEK IMMEDIATE MEDICAL CARE IF:   You have a severe headache, neck pain, or neck stiffness.  You vomit.  You feel very sleepy.  You notice red streaks coming from the affected area.  Your bone or joint underneath the affected area becomes painful after the skin has healed.  The affected area turns darker.  You have difficulty breathing.   This information is not intended to replace advice given to you by your health care provider. Make sure you discuss any questions you have with your health care provider.   Document Released: 03/25/2000 Document Revised: 12/17/2014 Document Reviewed: 08/13/2014 Elsevier Interactive Patient Education 2016 [employer]  
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
## 2015-08-23 ENCOUNTER — Other Ambulatory Visit: Payer: Self-pay | Admitting: Internal Medicine

## 2015-08-29 ENCOUNTER — Encounter: Payer: Self-pay | Admitting: *Deleted

## 2015-09-08 ENCOUNTER — Ambulatory Visit (INDEPENDENT_AMBULATORY_CARE_PROVIDER_SITE_OTHER): Payer: Medicare Other | Admitting: Internal Medicine

## 2015-09-08 ENCOUNTER — Encounter: Payer: Self-pay | Admitting: Internal Medicine

## 2015-09-08 ENCOUNTER — Other Ambulatory Visit: Payer: Self-pay | Admitting: *Deleted

## 2015-09-08 VITALS — BP 118/64 | HR 92 | Temp 97.3°F | Resp 16 | Ht 67.75 in | Wt 209.2 lb

## 2015-09-08 DIAGNOSIS — J449 Chronic obstructive pulmonary disease, unspecified: Secondary | ICD-10-CM

## 2015-09-08 DIAGNOSIS — M545 Low back pain, unspecified: Secondary | ICD-10-CM

## 2015-09-08 DIAGNOSIS — G8929 Other chronic pain: Secondary | ICD-10-CM

## 2015-09-08 DIAGNOSIS — E1121 Type 2 diabetes mellitus with diabetic nephropathy: Secondary | ICD-10-CM

## 2015-09-08 DIAGNOSIS — E559 Vitamin D deficiency, unspecified: Secondary | ICD-10-CM

## 2015-09-08 DIAGNOSIS — I10 Essential (primary) hypertension: Secondary | ICD-10-CM | POA: Diagnosis not present

## 2015-09-08 DIAGNOSIS — E782 Mixed hyperlipidemia: Secondary | ICD-10-CM | POA: Diagnosis not present

## 2015-09-08 DIAGNOSIS — Z79899 Other long term (current) drug therapy: Secondary | ICD-10-CM | POA: Diagnosis not present

## 2015-09-08 LAB — CBC WITH DIFFERENTIAL/PLATELET
BASOS PCT: 0 %
Basophils Absolute: 0 cells/uL (ref 0–200)
Eosinophils Absolute: 219 cells/uL (ref 15–500)
Eosinophils Relative: 3 %
HCT: 36.6 % — ABNORMAL LOW (ref 38.5–50.0)
Hemoglobin: 11.9 g/dL — ABNORMAL LOW (ref 13.2–17.1)
Lymphocytes Relative: 23 %
Lymphs Abs: 1679 cells/uL (ref 850–3900)
MCH: 28.9 pg (ref 27.0–33.0)
MCHC: 32.5 g/dL (ref 32.0–36.0)
MCV: 88.8 fL (ref 80.0–100.0)
MONO ABS: 292 {cells}/uL (ref 200–950)
MONOS PCT: 4 %
MPV: 9.9 fL (ref 7.5–12.5)
NEUTROS ABS: 5110 {cells}/uL (ref 1500–7800)
Neutrophils Relative %: 70 %
PLATELETS: 267 10*3/uL (ref 140–400)
RBC: 4.12 MIL/uL — AB (ref 4.20–5.80)
RDW: 15.2 % — AB (ref 11.0–15.0)
WBC: 7.3 10*3/uL (ref 3.8–10.8)

## 2015-09-08 MED ORDER — TRIAMCINOLONE ACETONIDE 0.1 % EX CREA: 1.0000 "application " | TOPICAL_CREAM | Freq: Four times a day (QID) | CUTANEOUS | Status: DC

## 2015-09-08 NOTE — Progress Notes (Signed)
Patient ID: James Parsons, male   DOB: 1945-12-15, 70 y.o.   MRN: [id number]  [facility] & ADOL[facility]NAL MEDIC[facility]                       Unk Pinto, M.D.        Uvaldo Bristle. Silverio Lay, P.A.-C       Starlyn Skeans, P.A.-C   [facility]                [address] [place] [id number] Telephone [phone] Telefax [phone] _________________________________________________________________________   This very nice 70 y.o. MWM presents for 6 month follow up with Hypertension, Hyperlipidemia, Pre-Diabetes and Vitamin D Deficiency. Patient has Chronic LBP Syndrome and is followed By Dr Mechele Dawley at a [facility] with a Fentanyl pump since 2010. He did have a spinal cord stimulator implanted in 2005, but no longer uses it. His most recent surgery was in Oct 2016 for a Lumbar fusion.    Patient is treated for HTN since circa 2000 & BP has been controlled at home. Today's BP: 118/64 mmHg. Patient has had no complaints of any cardiac type chest pain, palpitations, dyspnea/orthopnea/PND, dizziness, claudication, or dependent edema.   Hyperlipidemia is controlled with diet & meds. Patient denies myalgias or other med SE's. Last Lipids were at goal with Cholesterol 146; HDL 42; LDL 59; but elevated  Triglycerides 225 on 06/09/2015.   Also, the patient has history of T2_NIDDM predating circa 2007 and has had no symptoms of reactive hypoglycemia, diabetic polys, paresthesias or visual blurring.  Last A1c was  12.4% on 06/09/2015. Now he reports he only checks CBG's 1-2 x/week and range approx 160 mg%.    Further, the patient also has history of Vitamin D Deficiency and supplements vitamin D without any suspected side-effects. Last vitamin D was 41 on 03/10/2015.    Medication Sig  . atorvastatin  80 MG tablet Take 0.5 tablets (40 mg total) by mouth daily at 6 PM.  . celecoxib  200 MG capsule Take 200 mg by mouth 2 (two) times daily.  [name]  VITAMIN D 5000 UNITS TABS Take 5,000 Units by mouth 2 (two) times daily.  . cloNIDine  0.1 MG tablet Take 0.1 mg by mouth 2 (two) times daily.  . cyclobenzaprine  10 MG tablet Take 10 mg by mouth 3 (three) times daily as needed for muscle spasms. Muscle spasm.  . docusate  100 MG CAPS Take 100 mg by mouth 2 (two) times daily.  . DULoxetine  60 MG capsule Take 60 mg by mouth daily.   . folic acid  1 MG tablet Take 1 mg by mouth daily.  [name] glimepiride  4 MG tablet Take 1 tablet twice daily with meals.  Do not take if you do not eat.  [name] LIDODERM 5 % Place 1 patch onto the skin daily.  [name] LYRICA 150 MG capsule Take 150 mg by mouth 2 (two) times daily.   . metFORMIN 500 MG tablet TAKE TWO TABLETS BY MOUTH TWICE DAILY  . methotrexate2.5 MG tablet   . predniSONE  5 MG tablet Take 5 mg by mouth 2 (two) times daily with a meal.  . PRESCRIPTION MEDICATION 0.5 mcg/mL by Pump Prime route continuous. Fentanyl 0.05mg /ml solution 5,000 mcg. Patient will bring dosage instructions with him. Daily dose 0.84mg .  . tamsulosin0.4 MG CAPS capsule  Take 1 capsule (0.4 mg total) by mouth daily after supper.  . traMADol (ULTRAM) 50 MG tablet Take 1 tablet (50 mg total) by mouth every 6 (six) hours as needed for moderate pain. pain  . triamcinolone cream (KENALOG) 0.1 % Apply 1 application topically 4 (four) times daily.   Allergies  Allergen Reactions  . Codeine Itching  . Morphine And Related Other (See Comments)    hallucinations   PMHx:   Past Medical History  Diagnosis Date  . Anemia   . Arthritis   . Blood transfusion   . Clotting disorder (HCC)     bleeds easily-no dx  . Diabetes mellitus   . Borderline hypertension   . Venous insufficiency   . Hyperlipidemia   . Obesity   . Diverticulosis of colon   . Hx of colonic polyps   . Low back pain   . Lipoma   . RSD (reflex sympathetic dystrophy)   . Mental disorder   . Depression     hx of   . COPD (chronic obstructive pulmonary disease) ([place])      denies  . Pneumonia     hx  . Full dentures    Immunization History  Administered Date(s) Administered  . Influenza Split 04/22/2011, 01/20/2012  . Influenza Whole 01/15/2007, 01/21/2009, 03/08/2010  . Influenza, High Dose Seasonal PF 02/07/2013  . Influenza-Unspecified 12/10/2013, 01/10/2015  . Pneumococcal Conjugate-13 06/09/2015  . Pneumococcal Polysaccharide-23 04/22/2011  . Tdap 04/11/2001, 05/07/2013   Past Surgical History  Procedure Laterality Date  . Back surgery  [id number]  . Morphine pump  2009    due to reaction to morphine/changed to fentanyl  . Excision of lipoma from right olecranon area  11/2000    Dr. Rise [name]  . Microdiscectomy and decompression  05/2002    Dr. Tonita Cong  . C3-4 anterior cervical discectomy and fusion with plating at c3-4  05/2006    Dr. Arnoldo Morale  . Spinal cord stimulator implanted      for pain per Dr. Maryruth Eve  . Subcut pain pump implanted    . Pain pump implantation      with fentanyl  . Tonsillectomy    . Colonoscopy w/ polypectomy    . Knee arthroscopy      right knee  . Total knee arthroplasty  02/29/2012    Procedure: TOTAL KNEE ARTHROPLASTY;  Surgeon: Kerin Salen, MD  . Lumbar laminectomy/decompression microdiscectomy N/A 01/08/2014    Procedure: L4-S1 Decompression with removal and reimplantation of spinal cord stimulator battery ;  Surgeon: Melina Schools, MD  . Knee arthroscopy Right 06/06/2014    Procedure: ARTHROSCOPY RIGHT KNEE WITH REMOVAL OF FIBROUS BANDS;  Surgeon: Kerin Salen, MD   FHx:    Reviewed / unchanged  SHx:    Reviewed / unchanged  Systems Review:  Constitutional: Denies fever, chills, wt changes, headaches, insomnia, fatigue, night sweats, change in appetite. Eyes: Denies redness, blurred vision, diplopia, discharge, itchy, watery eyes.  ENT: Denies discharge, congestion, post nasal drip, epistaxis, sore throat, earache, hearing loss, dental pain, tinnitus, vertigo, sinus pain, snoring.  CV: Denies chest pain,  palpitations, irregular heartbeat, syncope, dyspnea, diaphoresis, orthopnea, PND, claudication or edema. Respiratory: denies cough, dyspnea, DOE, pleurisy, hoarseness, laryngitis, wheezing.  Gastrointestinal: Denies dysphagia, odynophagia, heartburn, reflux, water brash, abdominal pain or cramps, nausea, vomiting, bloating, diarrhea, constipation, hematemesis, melena, hematochezia  or hemorrhoids. Genitourinary: Denies dysuria, frequency, urgency, nocturia, hesitancy, discharge, hematuria or flank pain. Musculoskeletal: Denies arthralgias, myalgias, stiffness, jt. swelling, pain, limping or  strain/sprain.  Skin: Denies pruritus, rash, hives, warts, acne, eczema or change in skin lesion(s). Neuro: No weakness, tremor, incoordination, spasms, paresthesia or pain. Psychiatric: Denies confusion, memory loss or sensory loss. Endo: Denies change in weight, skin or hair change.  Heme/Lymph: No excessive bleeding, bruising or enlarged lymph nodes.  Physical Exam  BP 118/64 mmHg  Pulse 92  Temp(Src) 97.3 F (36.3 C)  Resp 16  Ht 5' 7.75" (1.721 m)  Wt 209 lb 3.2 oz (94.892 kg)  BMI 32.04 kg/m2  Appears well nourished and in no distress.  Eyes: PERRLA, EOMs, conjunctiva no swelling or erythema. Sinuses: No frontal/maxillary tenderness ENT/Mouth: EAC's clear, TM's nl w/o erythema, bulging. Nares clear w/o erythema, swelling, exudates. Oropharynx clear without erythema or exudates. Oral hygiene is good. Tongue normal, non obstructing. Hearing intact.  Neck: Supple. Thyroid nl. Car 2+/2+ without bruits, nodes or JVD. Chest: Respirations nl with BS clear & equal w/o rales, rhonchi, wheezing or stridor.  Cor: Heart sounds normal w/ regular rate and rhythm without sig. murmurs, gallops, clicks, or rubs. Peripheral pulses normal and equal  without edema.  Abdomen: Soft & bowel sounds normal. Non-tender w/o guarding, rebound, hernias, masses, or organomegaly.  Lymphatics: Unremarkable.   Musculoskeletal: Full ROM all peripheral extremities, joint stability, 5/5 strength, and normal gait.  Skin: Warm, dry without exposed rashes, lesions or ecchymosis apparent.  Neuro: Cranial nerves intact, reflexes equal bilaterally. Sensory-motor testing grossly intact. Tendon reflexes grossly intact.  Pysch: Alert & oriented x 3.  Insight and judgement nl & appropriate. No ideations.  Assessment and Plan:  1. Essential hypertension  - TSH  2. Mixed hyperlipidemia  - Lipid panel - TSH  3. Controlled type 2 diabetes mellitus with diabetic nephropathy, without long-term current use of insulin (HCC)  - Hemoglobin A1c - Insulin, random  4. Vitamin D deficiency  - VITAMIN D 25 Hydroxy   5. Chronic obstructive pulmonary disease ([place])   6. Chronic bilateral low back pain without sciatica   7. Medication management  - CBC with Differential/Platelet - BASIC METABOLIC PANEL WITH GFR - Hepatic function panel - Magnesium   Recommended regular exercise, BP monitoring, weight control, and discussed med and SE's. Recommended labs to assess and monitor clinical status. Further disposition pending results of labs. Over 30 minutes of exam, counseling, chart review was performed

## 2015-09-08 NOTE — Patient Instructions (Signed)

## 2015-09-09 ENCOUNTER — Other Ambulatory Visit: Payer: Self-pay | Admitting: *Deleted

## 2015-09-09 ENCOUNTER — Other Ambulatory Visit: Payer: Self-pay | Admitting: Internal Medicine

## 2015-09-09 LAB — BASIC METABOLIC PANEL WITH GFR
BUN: 9 mg/dL (ref 7–25)
CHLORIDE: 101 mmol/L (ref 98–110)
CO2: 24 mmol/L (ref 20–31)
Calcium: 9.1 mg/dL (ref 8.6–10.3)
Creat: 0.67 mg/dL — ABNORMAL LOW (ref 0.70–1.25)
Glucose, Bld: 266 mg/dL — ABNORMAL HIGH (ref 65–99)
POTASSIUM: 4.6 mmol/L (ref 3.5–5.3)
SODIUM: 138 mmol/L (ref 135–146)

## 2015-09-09 LAB — HEPATIC FUNCTION PANEL
ALK PHOS: 86 U/L (ref 40–115)
ALT: 21 U/L (ref 9–46)
AST: 17 U/L (ref 10–35)
Albumin: 3.9 g/dL (ref 3.6–5.1)
BILIRUBIN DIRECT: 0.1 mg/dL (ref <=0.2)
BILIRUBIN INDIRECT: 0.4 mg/dL (ref 0.2–1.2)
BILIRUBIN TOTAL: 0.5 mg/dL (ref 0.2–1.2)
Total Protein: 6.6 g/dL (ref 6.1–8.1)

## 2015-09-09 LAB — HEMOGLOBIN A1C
HEMOGLOBIN A1C: 8.6 % — AB (ref <5.7)
MEAN PLASMA GLUCOSE: 200 mg/dL

## 2015-09-09 LAB — VITAMIN D 25 HYDROXY (VIT D DEFICIENCY, FRACTURES): Vit D, 25-Hydroxy: 59 ng/mL (ref 30–100)

## 2015-09-09 LAB — INSULIN, RANDOM: INSULIN: 53.2 u[IU]/mL — AB (ref 2.0–19.6)

## 2015-09-09 LAB — LIPID PANEL
CHOL/HDL RATIO: 4.2 ratio (ref <=5.0)
Cholesterol: 140 mg/dL (ref 125–200)
HDL: 33 mg/dL — AB (ref >=40)
LDL CALC: 60 mg/dL (ref <130)
TRIGLYCERIDES: 237 mg/dL — AB (ref <150)
VLDL: 47 mg/dL — AB (ref <30)

## 2015-09-09 LAB — TSH: TSH: 2.99 mIU/L (ref 0.40–4.50)

## 2015-09-09 LAB — MAGNESIUM: Magnesium: 1.6 mg/dL (ref 1.5–2.5)

## 2015-09-09 MED ORDER — PREDNISONE 5 MG PO TABS: 5.0000 mg | ORAL_TABLET | Freq: Two times a day (BID) | ORAL | Status: DC

## 2015-09-29 ENCOUNTER — Other Ambulatory Visit: Payer: Self-pay | Admitting: Internal Medicine

## 2015-11-24 ENCOUNTER — Other Ambulatory Visit: Payer: Self-pay | Admitting: Internal Medicine

## 2015-12-10 ENCOUNTER — Ambulatory Visit (INDEPENDENT_AMBULATORY_CARE_PROVIDER_SITE_OTHER): Payer: Medicare Other | Admitting: Physician Assistant

## 2015-12-10 ENCOUNTER — Encounter: Payer: Self-pay | Admitting: Physician Assistant

## 2015-12-10 VITALS — BP 122/80 | HR 91 | Temp 97.7°F | Resp 14 | Ht 67.75 in | Wt 226.0 lb

## 2015-12-10 DIAGNOSIS — E1121 Type 2 diabetes mellitus with diabetic nephropathy: Secondary | ICD-10-CM | POA: Diagnosis not present

## 2015-12-10 DIAGNOSIS — Z23 Encounter for immunization: Secondary | ICD-10-CM

## 2015-12-10 DIAGNOSIS — I10 Essential (primary) hypertension: Secondary | ICD-10-CM

## 2015-12-10 DIAGNOSIS — E559 Vitamin D deficiency, unspecified: Secondary | ICD-10-CM

## 2015-12-10 DIAGNOSIS — J449 Chronic obstructive pulmonary disease, unspecified: Secondary | ICD-10-CM | POA: Diagnosis not present

## 2015-12-10 DIAGNOSIS — Z79899 Other long term (current) drug therapy: Secondary | ICD-10-CM | POA: Diagnosis not present

## 2015-12-10 DIAGNOSIS — E782 Mixed hyperlipidemia: Secondary | ICD-10-CM | POA: Diagnosis not present

## 2015-12-10 LAB — CBC WITH DIFFERENTIAL/PLATELET
BASOS PCT: 0 %
Basophils Absolute: 0 cells/uL (ref 0–200)
EOS PCT: 2 %
Eosinophils Absolute: 202 cells/uL (ref 15–500)
HCT: 38.6 % (ref 38.5–50.0)
Hemoglobin: 12.9 g/dL — ABNORMAL LOW (ref 13.2–17.1)
LYMPHS PCT: 19 %
Lymphs Abs: 1919 cells/uL (ref 850–3900)
MCH: 28.7 pg (ref 27.0–33.0)
MCHC: 33.4 g/dL (ref 32.0–36.0)
MCV: 86 fL (ref 80.0–100.0)
MONOS PCT: 4 %
MPV: 10.1 fL (ref 7.5–12.5)
Monocytes Absolute: 404 cells/uL (ref 200–950)
Neutro Abs: 7575 cells/uL (ref 1500–7800)
Neutrophils Relative %: 75 %
PLATELETS: 211 10*3/uL (ref 140–400)
RBC: 4.49 MIL/uL (ref 4.20–5.80)
RDW: 15.4 % — AB (ref 11.0–15.0)
WBC: 10.1 10*3/uL (ref 3.8–10.8)

## 2015-12-10 LAB — BASIC METABOLIC PANEL WITH GFR
BUN: 15 mg/dL (ref 7–25)
CO2: 25 mmol/L (ref 20–31)
Calcium: 9.5 mg/dL (ref 8.6–10.3)
Chloride: 101 mmol/L (ref 98–110)
Creat: 0.63 mg/dL — ABNORMAL LOW (ref 0.70–1.25)
GFR, Est Non African American: >89 mL/min (ref >=60)
Glucose, Bld: 195 mg/dL — ABNORMAL HIGH (ref 65–99)
POTASSIUM: 4.6 mmol/L (ref 3.5–5.3)
Sodium: 139 mmol/L (ref 135–146)

## 2015-12-10 LAB — HEPATIC FUNCTION PANEL
ALBUMIN: 4.1 g/dL (ref 3.6–5.1)
ALK PHOS: 72 U/L (ref 40–115)
ALT: 17 U/L (ref 9–46)
AST: 13 U/L (ref 10–35)
BILIRUBIN INDIRECT: 0.3 mg/dL (ref 0.2–1.2)
BILIRUBIN TOTAL: 0.4 mg/dL (ref 0.2–1.2)
Bilirubin, Direct: 0.1 mg/dL (ref <=0.2)
Total Protein: 6.8 g/dL (ref 6.1–8.1)

## 2015-12-10 LAB — LIPID PANEL
Cholesterol: 196 mg/dL (ref 125–200)
HDL: 61 mg/dL (ref >=40)
LDL CALC: 89 mg/dL (ref <130)
TRIGLYCERIDES: 231 mg/dL — AB (ref <150)
Total CHOL/HDL Ratio: 3.2 Ratio (ref <=5.0)
VLDL: 46 mg/dL — AB (ref <30)

## 2015-12-10 LAB — TSH: TSH: 0.95 mIU/L (ref 0.40–4.50)

## 2015-12-10 LAB — MAGNESIUM: Magnesium: 1.9 mg/dL (ref 1.5–2.5)

## 2015-12-10 MED ORDER — RANITIDINE HCL 300 MG PO TABS: 300.0000 mg | ORAL_TABLET | Freq: Every day | ORAL | 1 refills | Status: DC

## 2015-12-10 NOTE — Patient Instructions (Addendum)
Bad carbs also include fruit juice, alcohol, and sweet tea. These are empty calories that do not signal to your brain that you are full.   Please remember the good carbs are still carbs which convert into sugar. So please measure them out no more than 1/2-1 cup of rice, oatmeal, pasta, and beans  Veggies are however free foods! Pile them on.   Not all fruit is created equal. Please see the list below, the fruit at the bottom is higher in sugars than the fruit at the top. Please avoid all dried fruits.    Diabetes is a very complicated disease...lets simplify it.  An easy way to look at it to understand the complications is if you think of the extra sugar floating in your blood stream as glass shards floating through your blood stream.    Diabetes affects your small vessels first: 1) The glass shards (sugar) scraps down the tiny blood vessels in your eyes and lead to diabetic retinopathy, the leading cause of blindness in the [place]. Diabetes is the leading cause of newly diagnosed adult (68 to 70 years of age) blindness in the [place].  2) The glass shards scratches down the tiny vessels of your legs leading to nerve damage called neuropathy and can lead to amputations of your feet. More than 60% of all non-traumatic amputations of lower limbs occur in people with diabetes.  3) Over time the small vessels in your brain are shredded and closed off, individually this does not cause any problems but over a long period of time many of the small vessels being blocked can lead to Vascular Dementia.   4) Your kidney's are a filter system and have a "net" that keeps certain things in the body and lets bad things out. Sugar shreds this net and leads to kidney damage and eventually failure. Decreasing the sugar that is destroying the net and certain blood pressure medications can help stop or decrease progression of kidney disease. Diabetes was the primary cause of kidney failure in 44 percent of  all new cases in 2011.  5) Diabetes also destroys the small vessels in your penis that lead to erectile dysfunction. Eventually the vessels are so damaged that you may not be responsive to cialis or viagra.   Diabetes and your large vessels: Your larger vessels consist of your coronary arteries in your heart and the carotid vessels to your brain. Diabetes or even increased sugars put you at 300% increased risk of heart attack and stroke and this is why.. The sugar scrapes down your large blood vessels and your body sees this as an internal injury and tries to repair itself. Just like you get a scab on your skin, your platelets will stick to the blood vessel wall trying to heal it. This is why we have diabetics on low dose aspirin daily, this prevents the platelets from sticking and can prevent plaque formation. In addition, your body takes cholesterol and tries to shove it into the open wound. This is why we want your LDL, or bad cholesterol, below 70.   The combination of platelets and cholesterol over 5-10 years forms plaque that can break off and cause a heart attack or stroke.   PLEASE REMEMBER:  Diabetes is preventable! Up to 80 percent of complications and morbidities among individuals with type 2 diabetes can be prevented, delayed, or effectively treated and minimized with regular visits to a health professional, appropriate monitoring and medication, and a healthy diet and  lifestyle.  Can start on zantac 300mg  at night Food Choices for Gastroesophageal Reflux Disease, Adult When you have gastroesophageal reflux disease (GERD), the foods you eat and your eating habits are very important. Choosing the right foods can help ease the discomfort of GERD. WHAT GENERAL GUIDELINES DO I NEED TO FOLLOW?  Choose fruits, vegetables, whole grains, low-fat dairy products, and low-fat meat, fish, and poultry.  Limit fats such as oils, salad dressings, butter, nuts, and avocado.  Keep a food diary to  identify foods that cause symptoms.  Avoid foods that cause reflux. These may be different for different people.  Eat frequent small meals instead of three large meals each day.  Eat your meals slowly, in a relaxed setting.  Limit fried foods.  Cook foods using methods other than frying.  Avoid drinking alcohol.  Avoid drinking large amounts of liquids with your meals.  Avoid bending over or lying down until 2-3 hours after eating. WHAT FOODS ARE NOT RECOMMENDED? The following are some foods and drinks that may worsen your symptoms: Vegetables Tomatoes. Tomato juice. Tomato and spaghetti sauce. Chili peppers. Onion and garlic. Horseradish. Fruits Oranges, grapefruit, and lemon (fruit and juice). Meats High-fat meats, fish, and poultry. This includes hot dogs, ribs, ham, sausage, salami, and bacon. Dairy Whole milk and chocolate milk. Sour cream. Cream. Butter. Ice cream. Cream cheese.  Beverages Coffee and tea, with or without caffeine. Carbonated beverages or energy drinks. Condiments Hot sauce. Barbecue sauce.  Sweets/Desserts Chocolate and cocoa. Donuts. Peppermint and spearmint. Fats and Oils High-fat foods, including [place] fries and potato chips. Other Vinegar. Strong spices, such as black pepper, white pepper, red pepper, cayenne, curry powder, cloves, ginger, and chili powder. The items listed above may not be a complete list of foods and beverages to avoid. Contact your dietitian for more information.   This information is not intended to replace advice given to you by your health care provider. Make sure you discuss any questions you have with your health care provider.   Document Released: 03/28/2005 Document Revised: 04/18/2014 Document Reviewed: 01/30/2013 Elsevier Interactive Patient Education [employer].

## 2015-12-10 NOTE — Progress Notes (Signed)
Assessment and Plan:   Hypertension -Continue medication, monitor blood pressure at home. Continue DASH diet.  Reminder to go to the ER if any CP, SOB, nausea, dizziness, severe HA, changes vision/speech, left arm numbness and tingling and jaw pain.  Cholesterol -Continue diet and exercise. Check cholesterol.    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 Check A1C LONG discussion about DM and about possibly adding on insulin, patient is resistant but discussed risk MI/Stroke/CKD/blindness- will consider it.   Vitamin D Def - check level and continue medications.   Morbid Obesity  - long discussion about weight loss, diet, and exercise Discussed how weight loss would help his back/legs  COPD  continue meds.    Continue diet and meds as discussed. Further disposition pending results of labs. Over 30 minutes of exam, counseling, chart review, and critical decision making was performed  HPI 70 y.o. male  presents for 3 month follow up on hypertension, cholesterol, prediabetes, and vitamin D deficiency.   His blood pressure has been controlled at home, today their BP is BP: 122/80  He does not workout, was going to [facility] 5 days a week and wants to start back up. He denies chest pain, shortness of breath, dizziness.  He is on cholesterol medication and denies myalgias. His cholesterol is at goal. The cholesterol last visit was:  Lab Results  Component Value Date   CHOL 140 09/08/2015   HDL 33 (L) 09/08/2015   LDLCALC 60 09/08/2015   LDLDIRECT 178.6 09/08/2009   TRIG 237 (H) 09/08/2015   CHOLHDL 4.2 09/08/2015    He has been working on diet and exercise for diabetes,  Lab Results  Component Value Date   GFRNONAA >89 09/08/2015   He is on bASA, he is not on ACE due to hypotension and denies paresthesia of the feet, polydipsia, polyuria and visual disturbances. Last A1C in the office was:  Lab Results  Component Value Date   HGBA1C 8.6 (H) 09/08/2015   Patient is on  Vitamin D supplement.   Lab Results  Component Value Date   VD25OH 62 09/08/2015     He has lower back pain and RSD, follows with Dr. Mechele Dawley at [facility], he walks with a cane and complains of pain. He also follows with Dr. Lenna Gilford for RA.  BMI is Body mass index is 34.62 kg/m., he has not been working on diet and exercise, admits to eating french fries and chocolate malt shakes.  Wt Readings from Last 3 Encounters:  12/10/15 226 lb (102.5 kg)  09/08/15 209 lb 3.2 oz (94.9 kg)  06/09/15 204 lb (92.5 kg)    Current Medications:  Current Outpatient Prescriptions on File Prior to Visit  Medication Sig Dispense Refill  . atorvastatin (LIPITOR) 80 MG tablet Take 0.5 tablets (40 mg total) by mouth daily at 6 PM. 45 tablet 2  . celecoxib (CELEBREX) 200 MG capsule Take 200 mg by mouth 2 (two) times daily.  2  . Cholecalciferol (VITAMIN D-3) 5000 UNITS TABS Take 5,000 Units by mouth 2 (two) times daily.    . cloNIDine (CATAPRES) 0.1 MG tablet Take 0.1 mg by mouth 2 (two) times daily.    . cyclobenzaprine (FLEXERIL) 10 MG tablet Take 10 mg by mouth 3 (three) times daily as needed for muscle spasms. Muscle spasm.    . docusate sodium 100 MG CAPS Take 100 mg by mouth 2 (two) times daily. 10 capsule 0  . DULoxetine (CYMBALTA) 60 MG capsule Take 60 mg  by mouth daily.     . folic acid (FOLVITE) 1 MG tablet Take 1 mg by mouth daily.  1  . glimepiride (AMARYL) 4 MG tablet TAKE 1 TABLET TWICE DAILY WITH MEALS. DO NOT TAKE IF YOU DO NOT EAT. 180 tablet 0  . lidocaine (LIDODERM) 5 % Place 1 patch onto the skin daily. 30 patch 0  . LYRICA 150 MG capsule Take 150 mg by mouth 2 (two) times daily.     . metFORMIN (GLUCOPHAGE) 500 MG tablet TAKE TWO TABLETS BY MOUTH TWICE DAILY 360 tablet 0  . methotrexate (RHEUMATREX) 2.5 MG tablet   1  . predniSONE (DELTASONE) 5 MG tablet Take 1 tablet (5 mg total) by mouth 2 (two) times daily with a meal. 60 tablet 3  . PRESCRIPTION MEDICATION 0.5 mcg/mL by Pump  Prime route continuous. Fentanyl 0.05mg /ml solution 5,000 mcg. Patient will bring dosage instructions with him. Daily dose 0.84mg .    . tamsulosin (FLOMAX) 0.4 MG CAPS capsule Take 1 capsule (0.4 mg total) by mouth daily after supper. 90 capsule 3  . traMADol (ULTRAM) 50 MG tablet Take 1 tablet (50 mg total) by mouth every 6 (six) hours as needed for moderate pain. pain 30 tablet 0  . triamcinolone cream (KENALOG) 0.1 % Apply 1 application topically 4 (four) times daily. 45 g 0   No current facility-administered medications on file prior to visit.    Medical History:  Past Medical History:  Diagnosis Date  . Anemia   . Arthritis   . Blood transfusion   . Borderline hypertension   . Clotting disorder (HCC)    bleeds easily-no dx  . COPD (chronic obstructive pulmonary disease) ([place])    denies  . Depression    hx of   . Diabetes mellitus   . Diverticulosis of colon   . Full dentures   . Hx of colonic polyps   . Hyperlipidemia   . Lipoma   . Low back pain   . Mental disorder   . Obesity   . Pneumonia    hx  . RSD (reflex sympathetic dystrophy)   . Venous insufficiency    Allergies:  Allergies  Allergen Reactions  . Codeine Itching  . Morphine And Related Other (See Comments)    hallucinations     Review of Systems:  Review of Systems  Constitutional: Negative for chills, fever and malaise/fatigue.  HENT: Negative for congestion, ear pain and sore throat.   Eyes: Negative.   Respiratory: Negative for cough, shortness of breath and wheezing.   Cardiovascular: Negative for chest pain, palpitations and leg swelling.  Gastrointestinal: Positive for constipation. Negative for abdominal pain, blood in stool, diarrhea, heartburn, melena, nausea and vomiting.  Genitourinary: Negative for dysuria, frequency and urgency.  Musculoskeletal: Positive for back pain, joint pain, myalgias and neck pain. Negative for falls.  Skin: Negative.   Neurological: Negative for dizziness,  sensory change and headaches.  Psychiatric/Behavioral: Negative for depression. The patient is not nervous/anxious.     Family history- Review and unchanged Social history- Review and unchanged Physical Exam: BP 122/80   Pulse 91   Temp 97.7 F (36.5 C)   Resp 14   Ht 5' 7.75" (1.721 m)   Wt 226 lb (102.5 kg)   SpO2 98%   BMI 34.62 kg/m  Wt Readings from Last 3 Encounters:  12/10/15 226 lb (102.5 kg)  09/08/15 209 lb 3.2 oz (94.9 kg)  06/09/15 204 lb (92.5 kg)   General Appearance: Well  nourished, in no apparent distress. Eyes: PERRLA, EOMs, conjunctiva no swelling or erythema Sinuses: No Frontal/maxillary tenderness ENT/Mouth: Ext aud canals clear, TMs without erythema, bulging. No erythema, swelling, or exudate on post pharynx.  Tonsils not swollen or erythematous. Hearing normal.  Neck: Supple, thyroid normal.  Respiratory: Respiratory effort normal, BS equal bilaterally without rales, rhonchi, wheezing or stridor.  Cardio: RRR with no MRGs. Brisk peripheral pulses without edema.  Abdomen: Soft, + BS,  Non tender, no guarding, rebound, hernias, masses. Lymphatics: Non tender without lymphadenopathy.  Musculoskeletal: Full ROM, 4/5 strength, Shuffling gait and ambulates with cane Skin: Warm, dry without rashes, lesions, ecchymosis.  Neuro: Cranial nerves intact. Normal muscle tone, no cerebellar symptoms. Psych: Awake and oriented X 3, normal affect, Insight and Judgment appropriate.    Vicie Mutters, PA-C 3:35 PM [facility]

## 2015-12-11 LAB — HEMOGLOBIN A1C
Hgb A1c MFr Bld: 8.7 % — ABNORMAL HIGH (ref <5.7)
Mean Plasma Glucose: 203 mg/dL

## 2015-12-26 ENCOUNTER — Other Ambulatory Visit: Payer: Self-pay | Admitting: Internal Medicine

## 2015-12-28 IMAGING — CR DG LUMBAR SPINE 1V
1 series · 1 of 1 positions shown · non-contrast
Comparison: CT scan 09/04/2013

CLINICAL DATA: Intraoperative localization for spine surgery.

EXAM:
LUMBAR SPINE - 1 VIEW

[lateral]
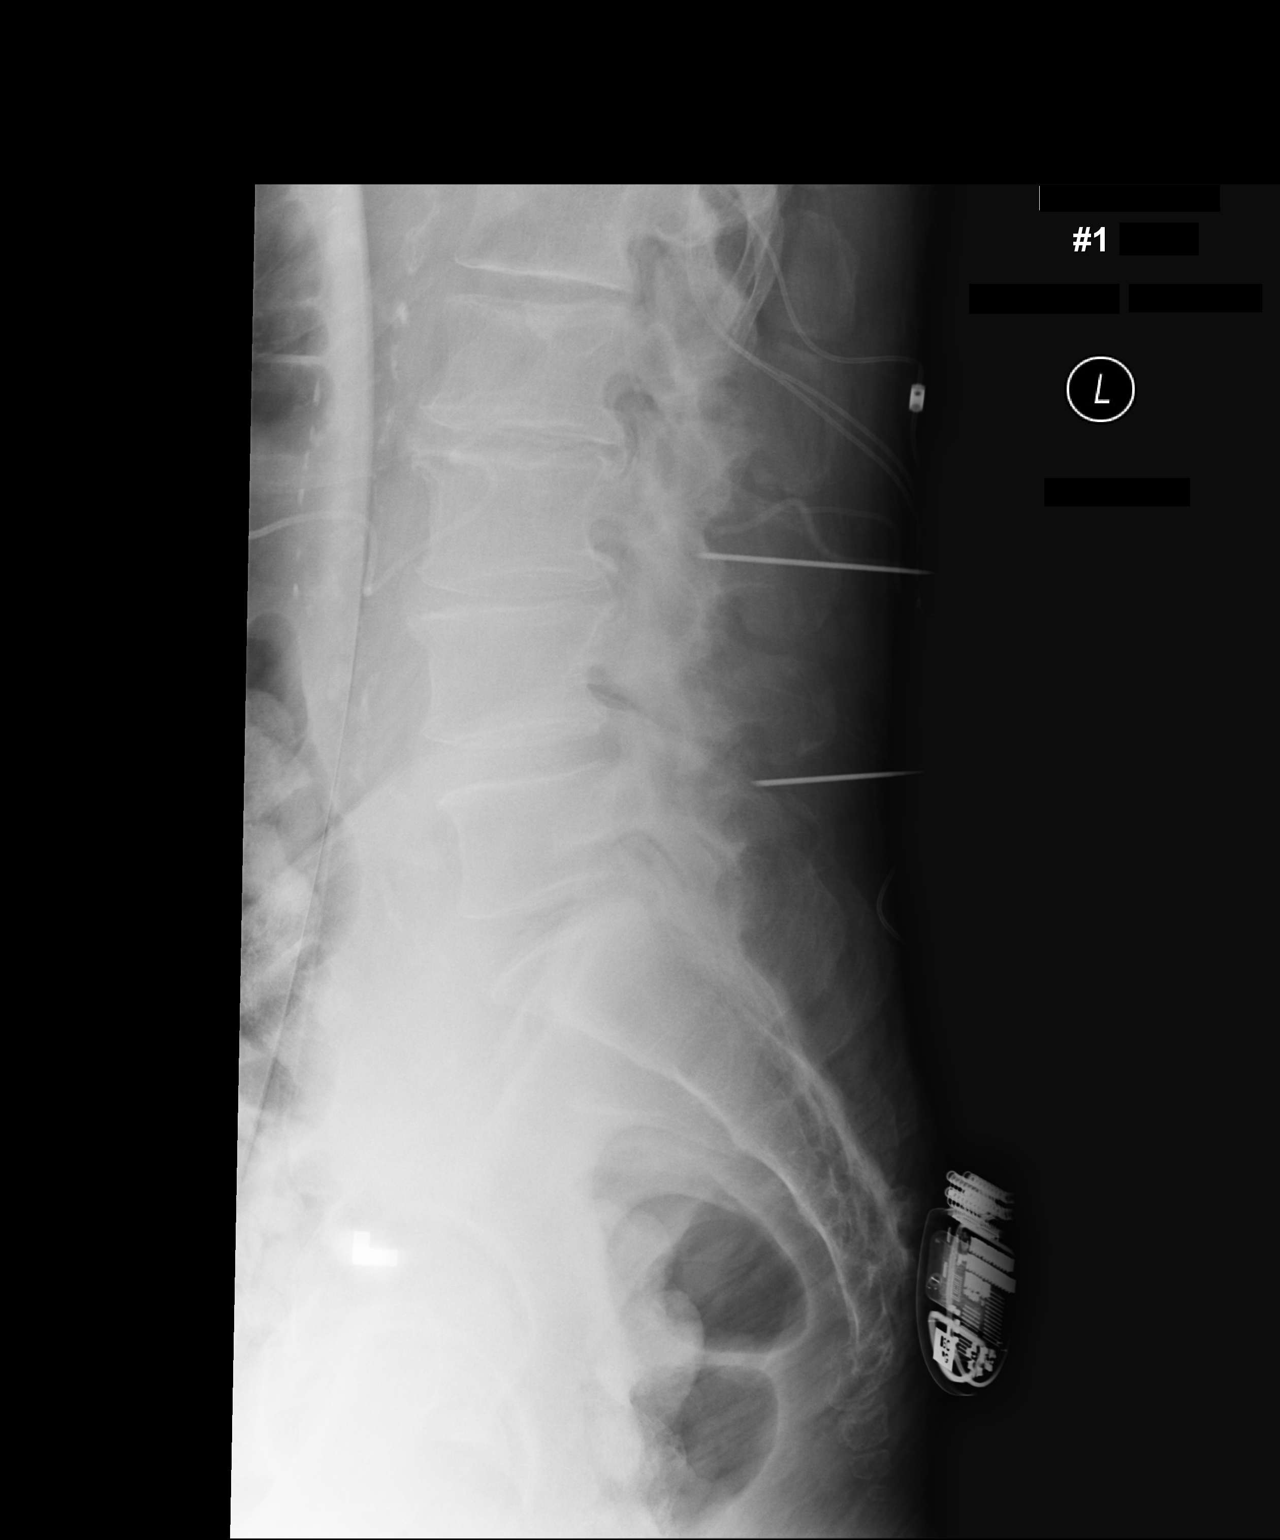

[1 of 1 positions shown; findings below may reference images not displayed]

FINDINGS: There are spinal needles marking a L5 and L3 levels.
IMPRESSION: L3 and L5 marked intraoperatively.

## 2016-02-03 ENCOUNTER — Other Ambulatory Visit: Payer: Self-pay

## 2016-02-03 MED ORDER — RANITIDINE HCL 300 MG PO TABS: 300.0000 mg | ORAL_TABLET | Freq: Every day | ORAL | 1 refills | Status: DC

## 2016-02-03 MED ORDER — PREDNISONE 5 MG PO TABS: 5.0000 mg | ORAL_TABLET | Freq: Two times a day (BID) | ORAL | 3 refills | Status: DC

## 2016-02-10 ENCOUNTER — Other Ambulatory Visit: Payer: Self-pay | Admitting: Physician Assistant

## 2016-02-14 ENCOUNTER — Encounter: Payer: Self-pay | Admitting: *Deleted

## 2016-02-22 ENCOUNTER — Other Ambulatory Visit: Payer: Self-pay

## 2016-02-22 MED ORDER — RANITIDINE HCL 300 MG PO TABS: 300.0000 mg | ORAL_TABLET | Freq: Every day | ORAL | 0 refills | Status: DC

## 2016-02-24 ENCOUNTER — Other Ambulatory Visit: Payer: Self-pay | Admitting: Physician Assistant

## 2016-03-17 ENCOUNTER — Ambulatory Visit (INDEPENDENT_AMBULATORY_CARE_PROVIDER_SITE_OTHER): Payer: Medicare Other | Admitting: Internal Medicine

## 2016-03-17 ENCOUNTER — Encounter: Payer: Self-pay | Admitting: Internal Medicine

## 2016-03-17 VITALS — BP 130/78 | HR 100 | Temp 98.2°F | Resp 16 | Ht 67.5 in | Wt 204.0 lb

## 2016-03-17 DIAGNOSIS — K219 Gastro-esophageal reflux disease without esophagitis: Secondary | ICD-10-CM | POA: Diagnosis not present

## 2016-03-17 DIAGNOSIS — R1319 Other dysphagia: Secondary | ICD-10-CM | POA: Diagnosis not present

## 2016-03-17 MED ORDER — SUCRALFATE 1 G PO TABS: 1.0000 g | ORAL_TABLET | Freq: Four times a day (QID) | ORAL | 1 refills | Status: DC

## 2016-03-17 MED ORDER — METOCLOPRAMIDE HCL 10 MG PO TABS: 10.0000 mg | ORAL_TABLET | Freq: Three times a day (TID) | ORAL | 1 refills | Status: DC

## 2016-03-17 MED ORDER — PANTOPRAZOLE SODIUM 40 MG PO TBEC: 40.0000 mg | DELAYED_RELEASE_TABLET | Freq: Every day | ORAL | 1 refills | Status: DC

## 2016-03-17 NOTE — Patient Instructions (Signed)
Please stop cellebrex twice daily.  Please start taking protonix first thing in the morning on an empty stomach.  Wait 30 minutes prior to eating anything.  Please start taking reglan three times daily 30 minutes prior to eating meals.  Please take carafate with meals.    If you are no better after two weeks give me a call.

## 2016-03-17 NOTE — Progress Notes (Signed)
Subjective:    Patient ID: James Parsons, male    DOB: 1946-01-09, 70 y.o.   MRN: [id number]  HPI  Patient reports to the office for evaluation of acid reflux and indigestion.  He reports that he has been having some hiccups first, and then he has to vomit.  This is not happening after every meal, but he would estimate that it is after about 80% of meals.  He reports that he has not had any trouble with swallowing his food.  He reports that he develops a tightness and pain in his chest and then he has to vomit.  He reports that he has lost 24 lbs in the last 6 weeks.  He reports that he can drink water and this helps.  He has tried ranitidine.  He feels like that makes this worse.  He has not tried anything over the counter.  He does have diabetes and he also has a prescription for prednisone and also cellebrex for his RA.  He reports that bowels are constipated to begin with but then soften.  No blood in his stool or black colored stools.  He has had no hematemesis.  He has had no coffee ground emesis.    Review of Systems  Constitutional: Negative for chills, fatigue and fever.  HENT: Positive for trouble swallowing. Negative for sore throat.   Respiratory: Negative for chest tightness and shortness of breath.   Cardiovascular: Positive for chest pain.  Gastrointestinal: Positive for abdominal pain, nausea and vomiting. Negative for anal bleeding, constipation and diarrhea.       Objective:   Physical Exam  Constitutional: He is oriented to person, place, and time. He appears well-developed and well-nourished. No distress.  HENT:  Head: Normocephalic.  Mouth/Throat: Oropharynx is clear and moist. No oropharyngeal exudate.  Eyes: Conjunctivae are normal. No scleral icterus.  Neck: Normal range of motion. Neck supple. No JVD present. No thyromegaly present.  Cardiovascular: Normal rate, regular rhythm, normal heart sounds and intact distal pulses.  Exam reveals no gallop and no friction  rub.   No murmur heard. Pulmonary/Chest: Effort normal and breath sounds normal. No respiratory distress. He has no wheezes. He has no rales. He exhibits tenderness.  Abdominal: Soft. Bowel sounds are normal. He exhibits no distension and no mass. There is no tenderness. There is no rebound and no guarding.  Musculoskeletal: Normal range of motion.  Lymphadenopathy:    He has no cervical adenopathy.  Neurological: He is alert and oriented to person, place, and time. No cranial nerve deficit. Coordination normal.  Skin: Skin is warm and dry. No rash noted. He is not diaphoretic.  Psychiatric: He has a normal mood and affect. His behavior is normal. Judgment and thought content normal.  Nursing note and vitals reviewed.   Vitals:   03/17/16 0912  BP: 130/78  Pulse: 100  Resp: 16  Temp: 98.2 F (36.8 C)   Wt Readings from Last 3 Encounters:  03/17/16 204 lb (92.5 kg)  12/10/15 226 lb (102.5 kg)  09/08/15 209 lb 3.2 oz (94.9 kg)          Assessment & Plan:   Ddx includes diabetic associated gastroparesis, achalsia, PUD, GERD, esophageal dysmotility, cholelithiasis.  Will treat as below.  If no improvement consider RUQ ultrasound vs. Gastric emptying study and referral to GI.  Patient to stop cellebrex for now and avoid prednisone use if possible.  He is to go to ER for coffee ground emesis, hematemesis, melena,  hematochezia   1. Other dysphagia  - pantoprazole (PROTONIX) 40 MG tablet; Take 1 tablet (40 mg total) by mouth daily.  Dispense: 30 tablet; Refill: 1 - sucralfate (CARAFATE) 1 g tablet; Take 1 tablet (1 g total) by mouth 4 (four) times daily.  Dispense: 120 tablet; Refill: 1 - DG Esophagus; Future - metoCLOPramide (REGLAN) 10 MG tablet; Take 1 tablet (10 mg total) by mouth 3 (three) times daily before meals.  Dispense: 90 tablet; Refill: 1  2. Gastroesophageal reflux disease without esophagitis  - pantoprazole (PROTONIX) 40 MG tablet; Take 1 tablet (40 mg total) by  mouth daily.  Dispense: 30 tablet; Refill: 1 - sucralfate (CARAFATE) 1 g tablet; Take 1 tablet (1 g total) by mouth 4 (four) times daily.  Dispense: 120 tablet; Refill: 1 - DG Esophagus; Future - metoCLOPramide (REGLAN) 10 MG tablet; Take 1 tablet (10 mg total) by mouth 3 (three) times daily before meals.  Dispense: 90 tablet; Refill: 1

## 2016-03-19 IMAGING — CR DG CHEST 2V
2 series · 2 of 2 positions shown · non-contrast
Comparison: 10/16/2013

CLINICAL DATA: Generalized weakness

EXAM:
CHEST  2 VIEW

[chest lat]
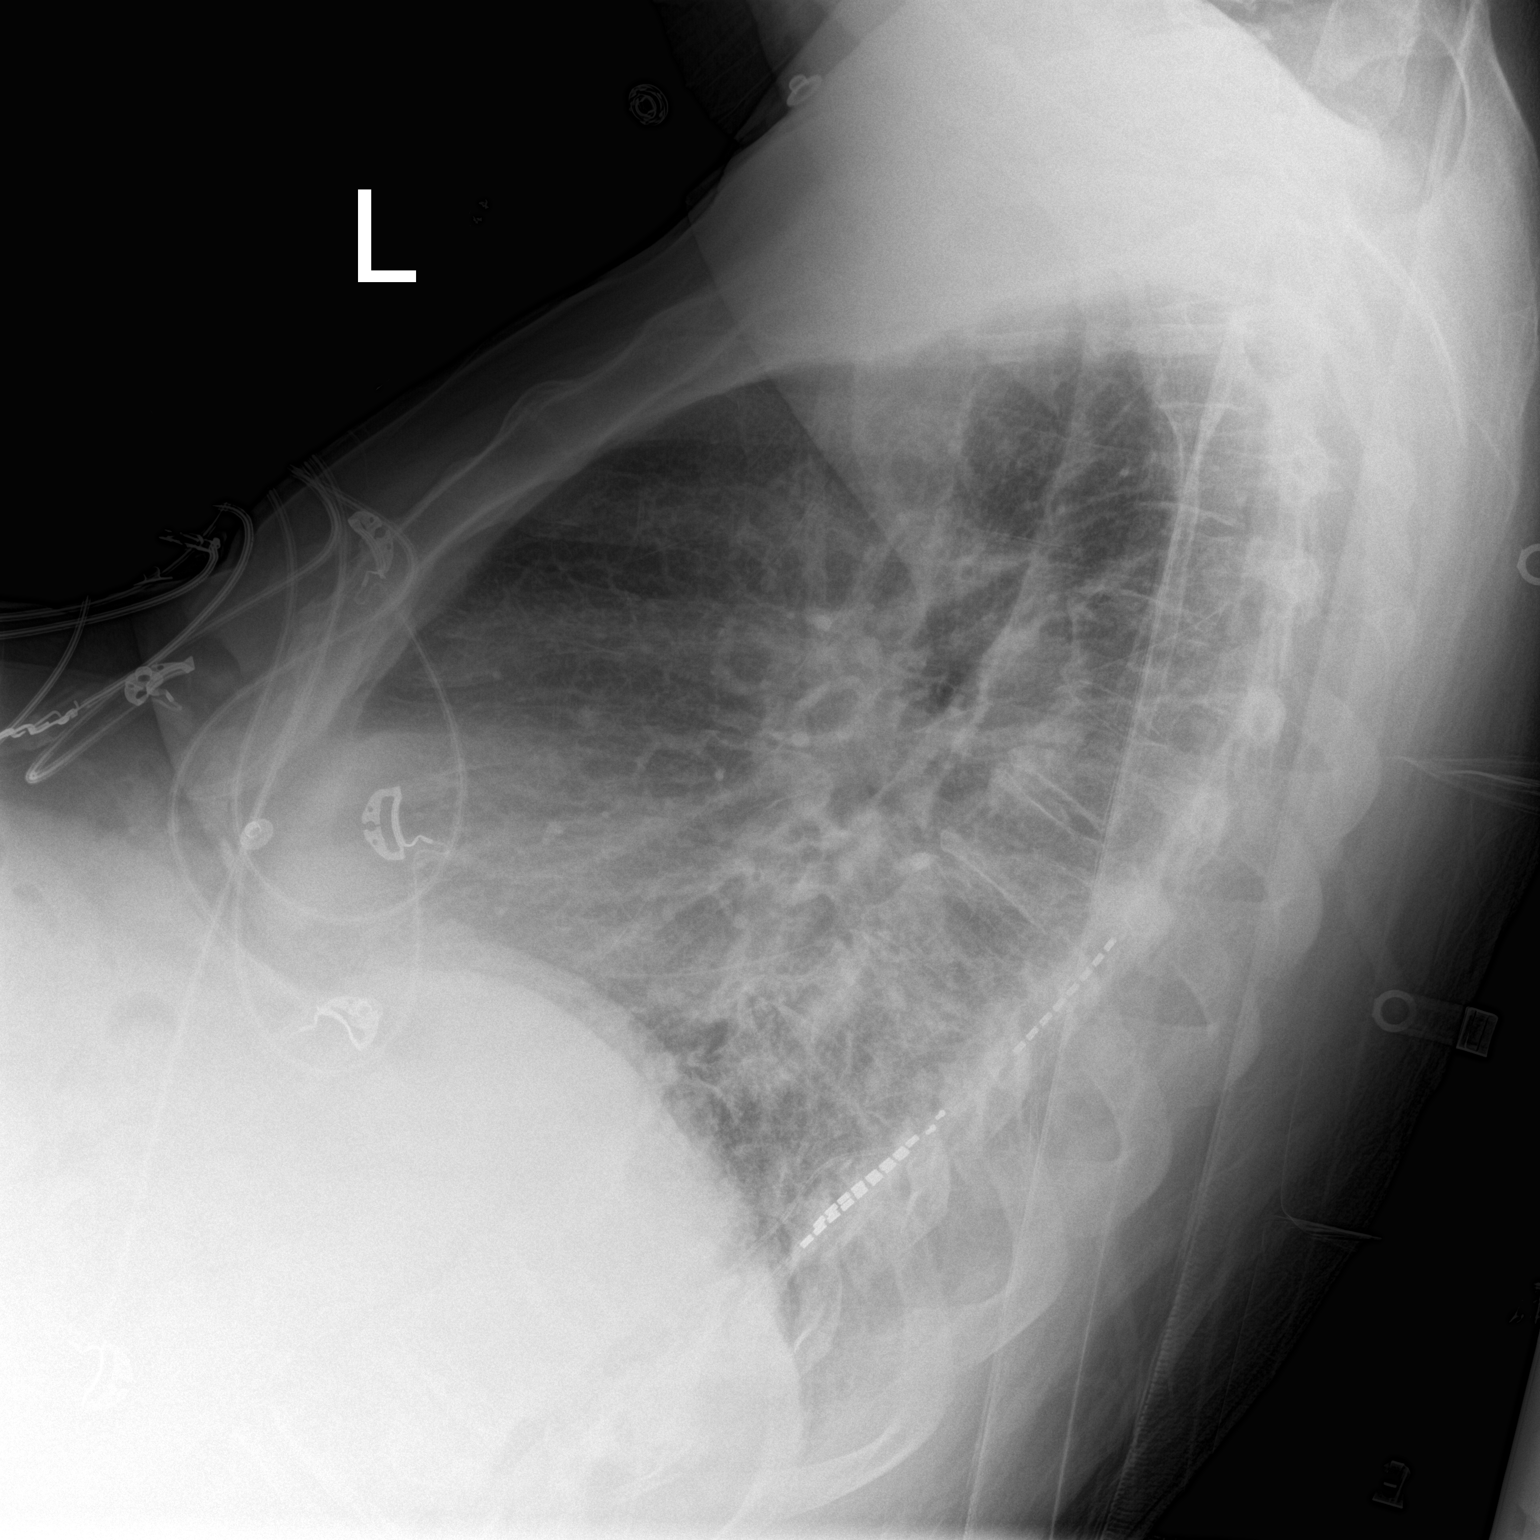

[chest ap]
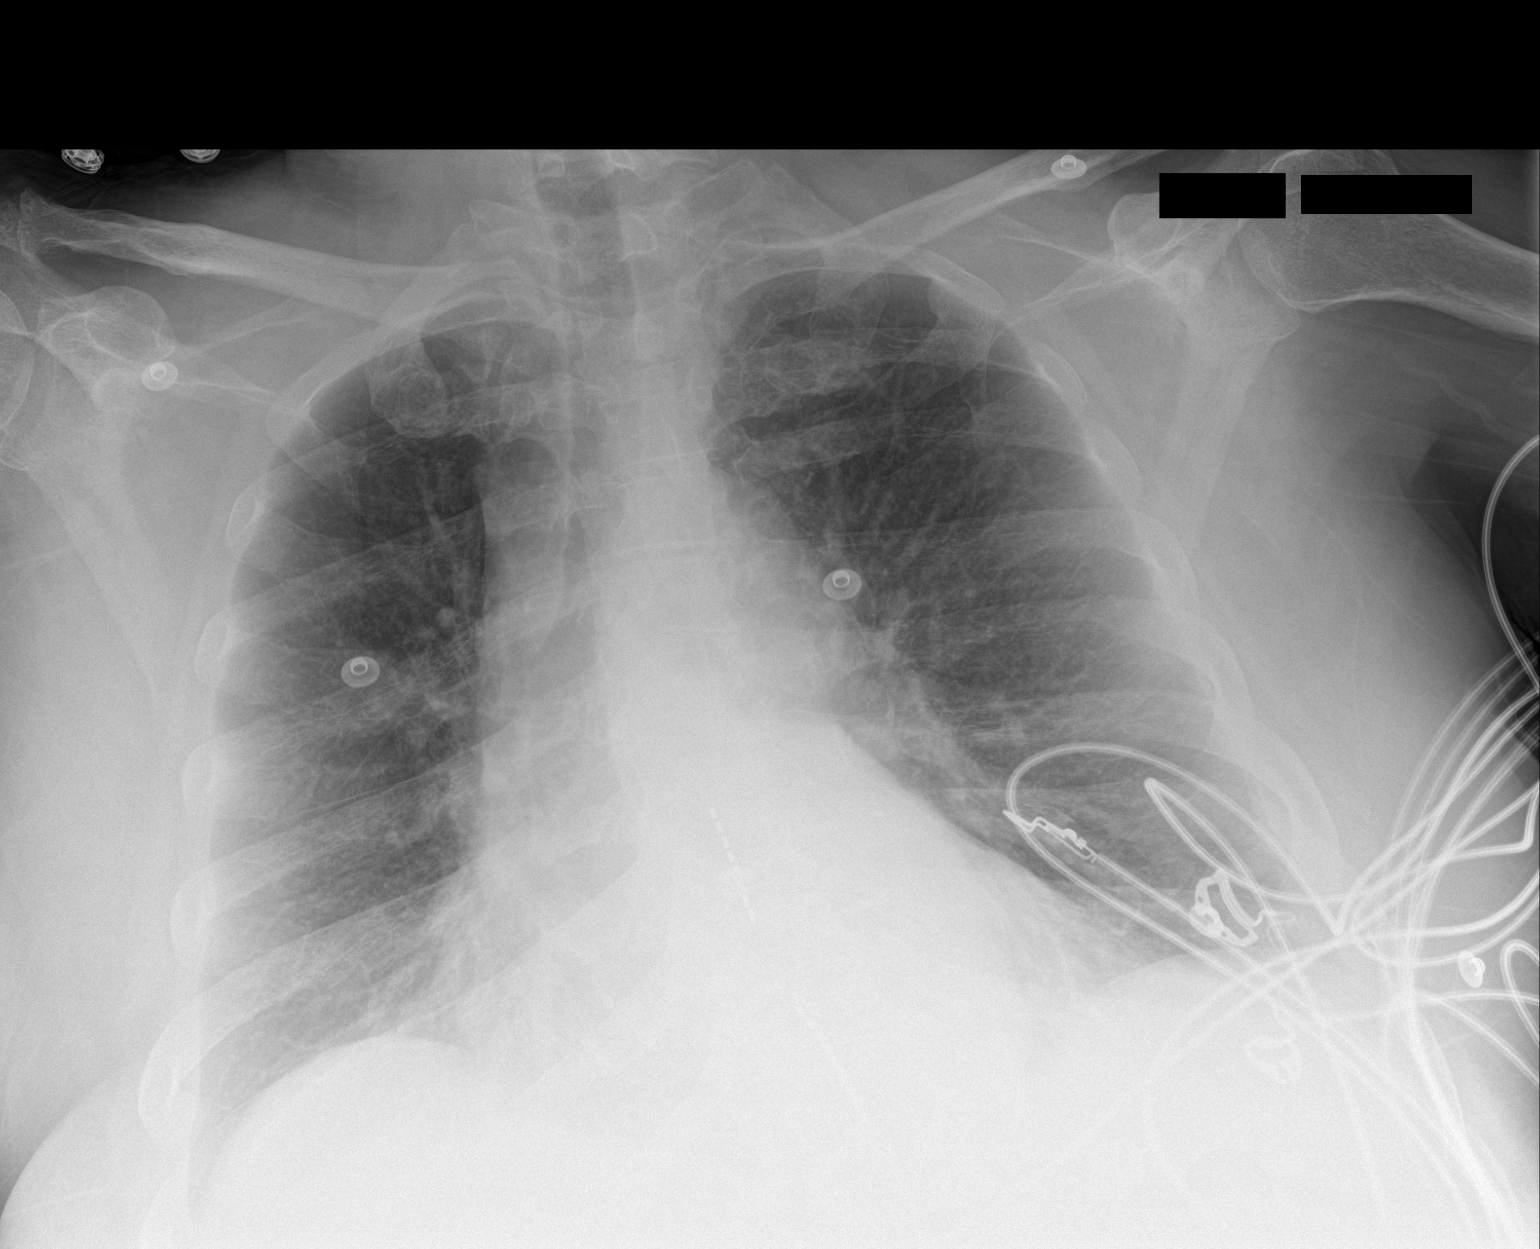

[2 of 2 positions shown; findings below may reference images not displayed]

FINDINGS: The heart size at upper limits of normal. Spinal stimulator noted.
Both lungs are clear. The visualized skeletal structures are
unremarkable but subjectively osteopenic.
IMPRESSION: No active cardiopulmonary disease.

## 2016-03-19 IMAGING — CR DG KNEE 1-2V*L*
2 series · 2 of 2 positions shown · non-contrast
Comparison: None.

CLINICAL DATA: Medial left knee pain.  Knee gave [DATE] days ago.

EXAM:
LEFT KNEE - 1-2 VIEW

[knee ap]
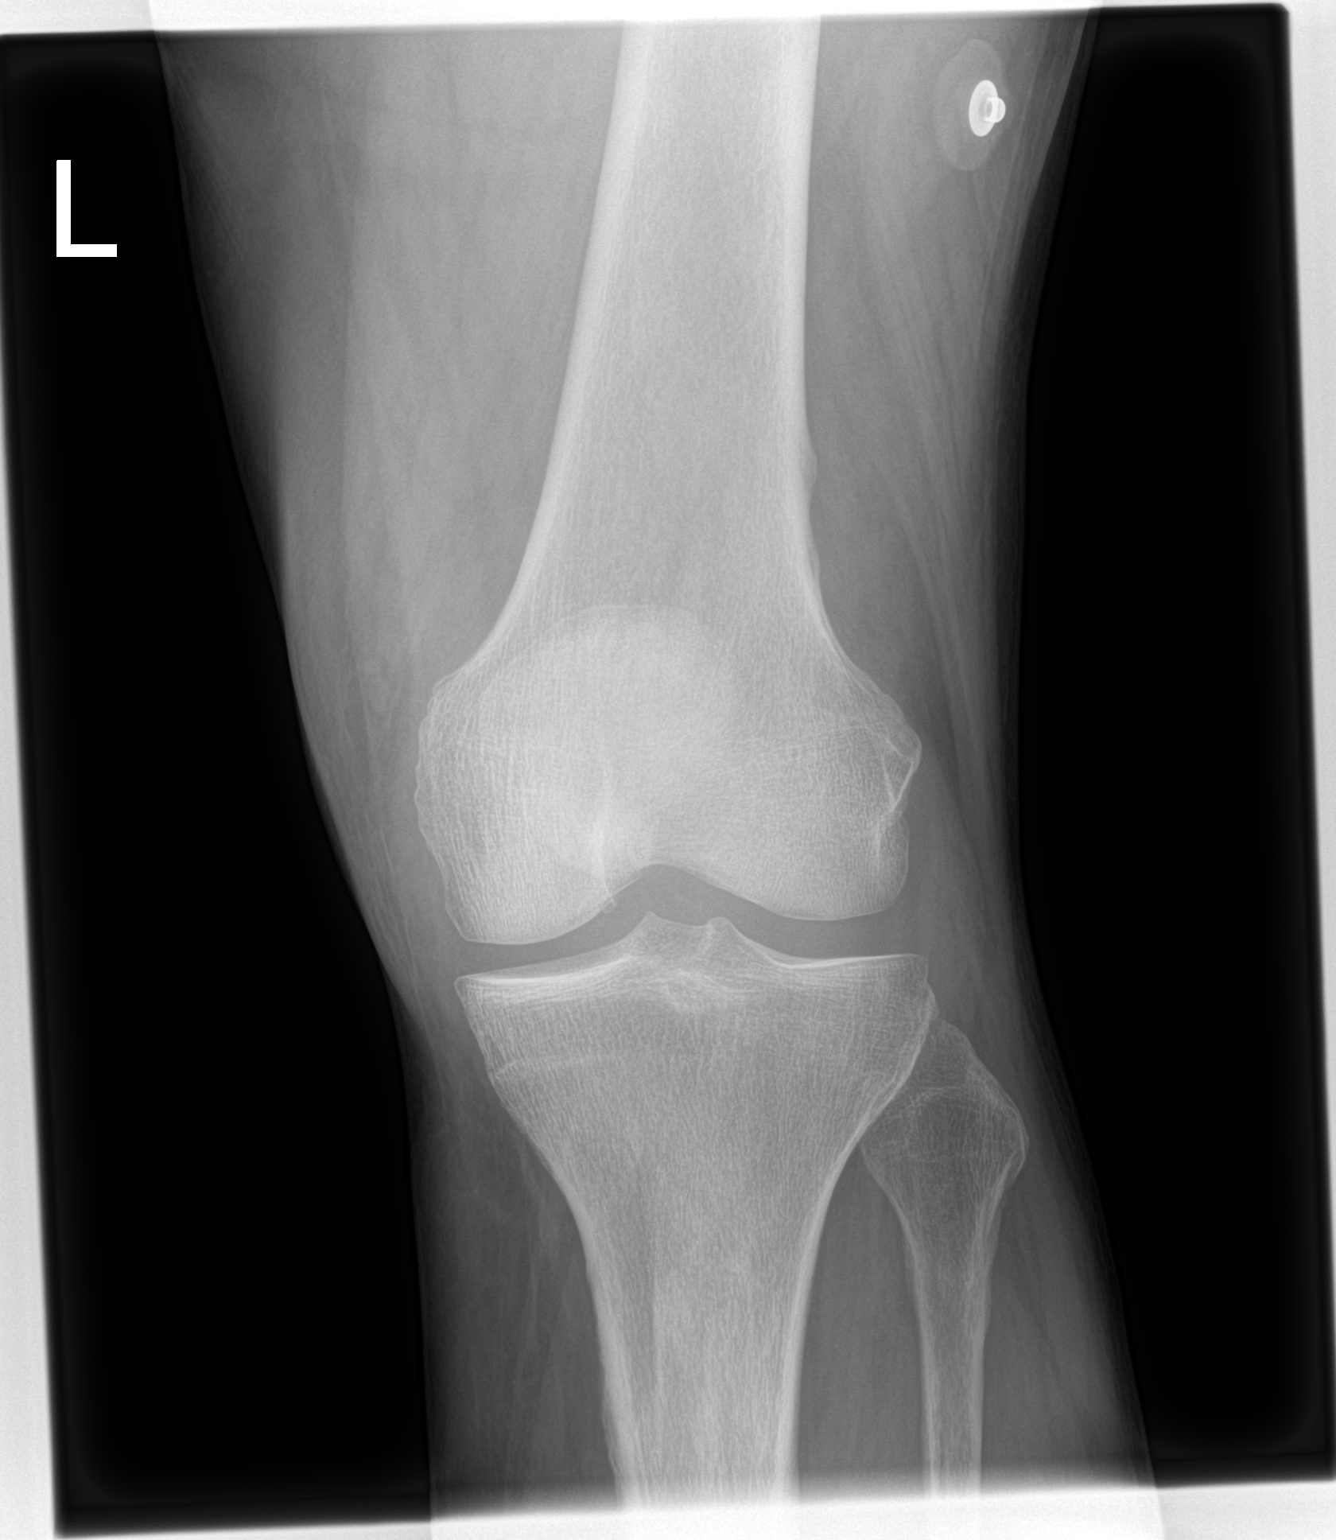

[knee lat]
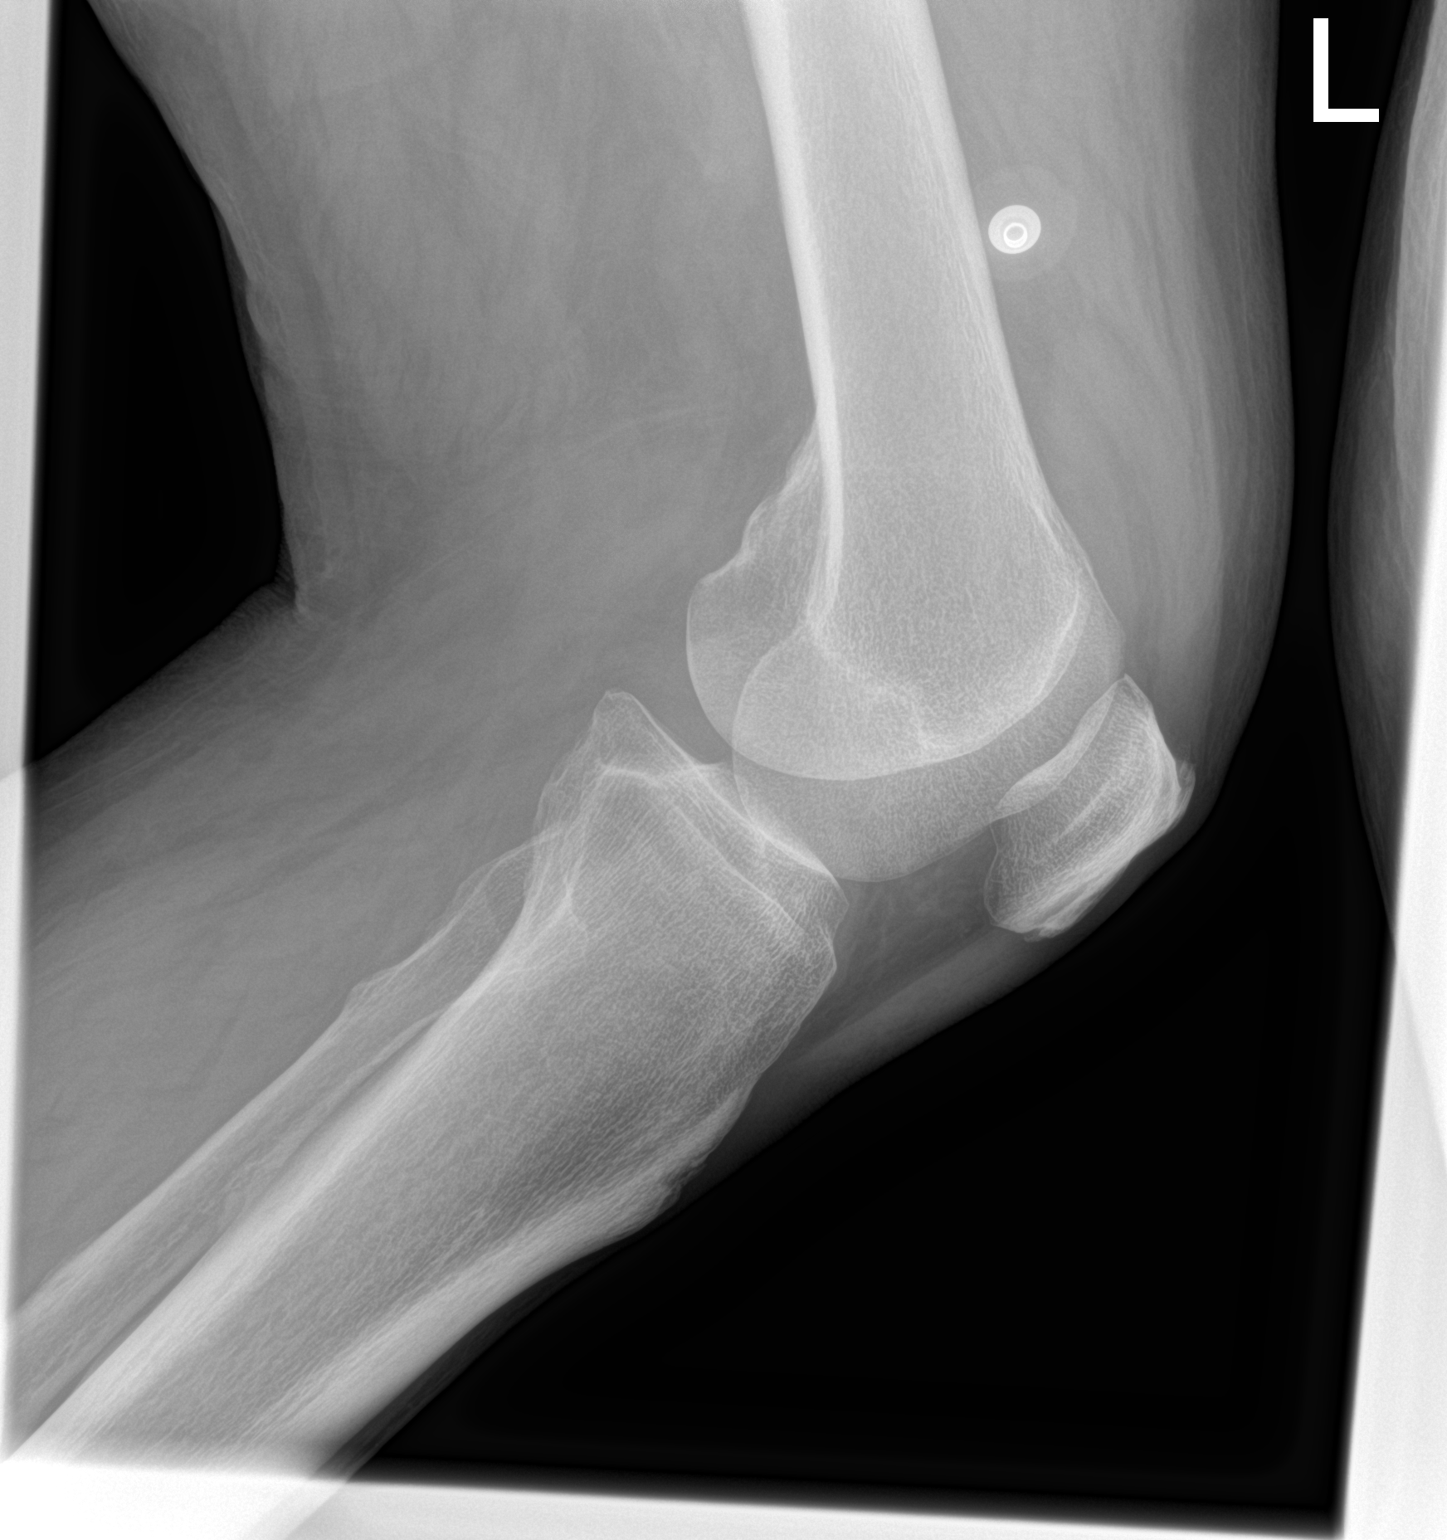

[2 of 2 positions shown; findings below may reference images not displayed]

FINDINGS: No acute fracture or dislocation is identified. There is a small
knee joint effusion. Medial and lateral compartment joint space
widths appear preserved. No lytic or blastic osseous lesion is
identified. No radiopaque foreign body.
IMPRESSION: Small knee joint effusion.  No acute osseous abnormality identified.

## 2016-03-19 IMAGING — CR DG WRIST COMPLETE 3+V*R*
4 series · 4 of 4 positions shown · non-contrast
Comparison: None.

CLINICAL DATA: Acute right lateral wrist pain and swelling since
last night without trauma

EXAM:
RIGHT WRIST - COMPLETE 3+ VIEW

[wrist pa]
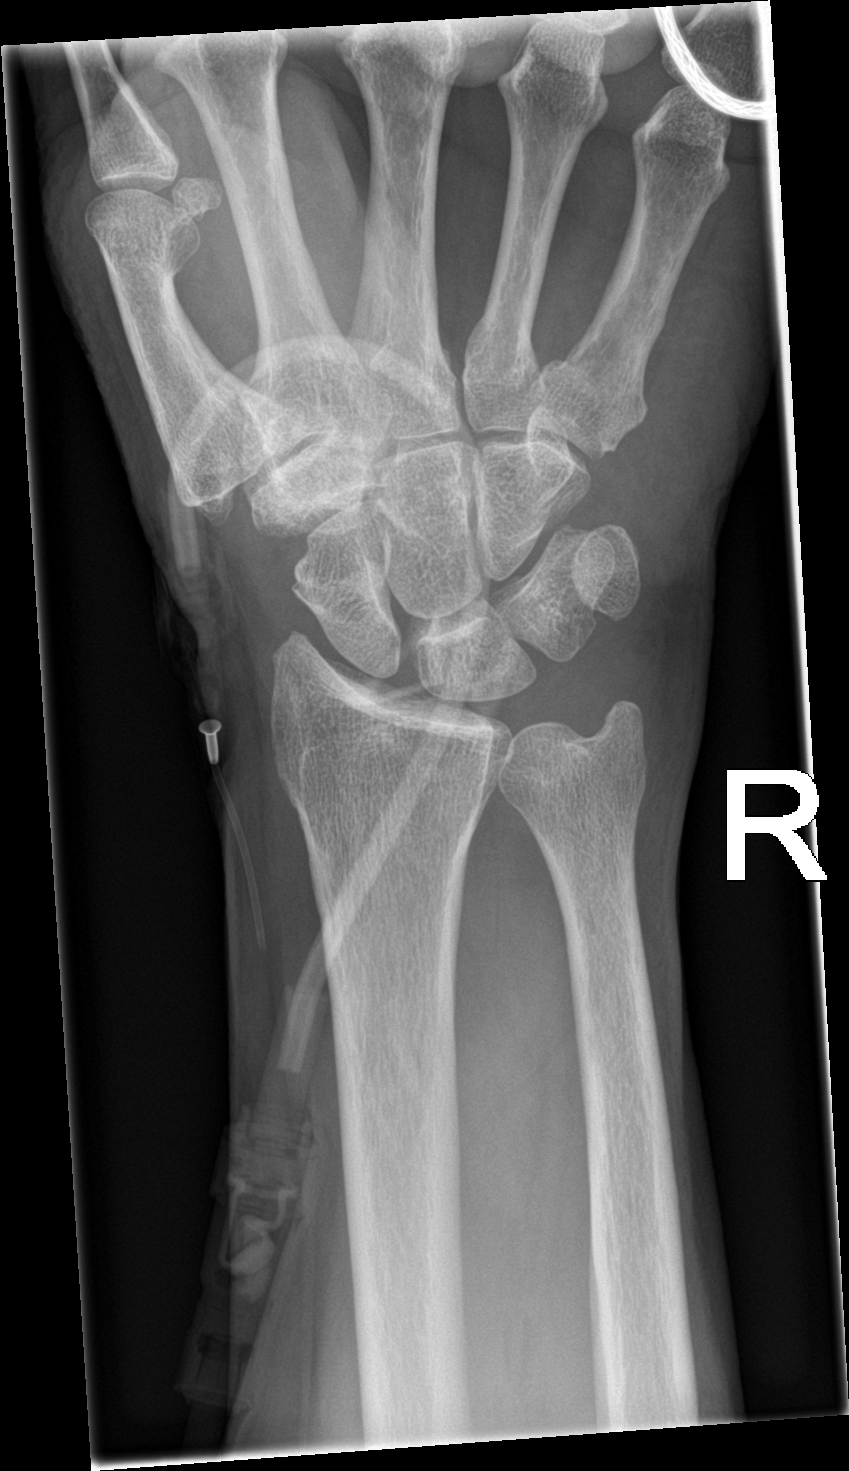

[wrist obl]
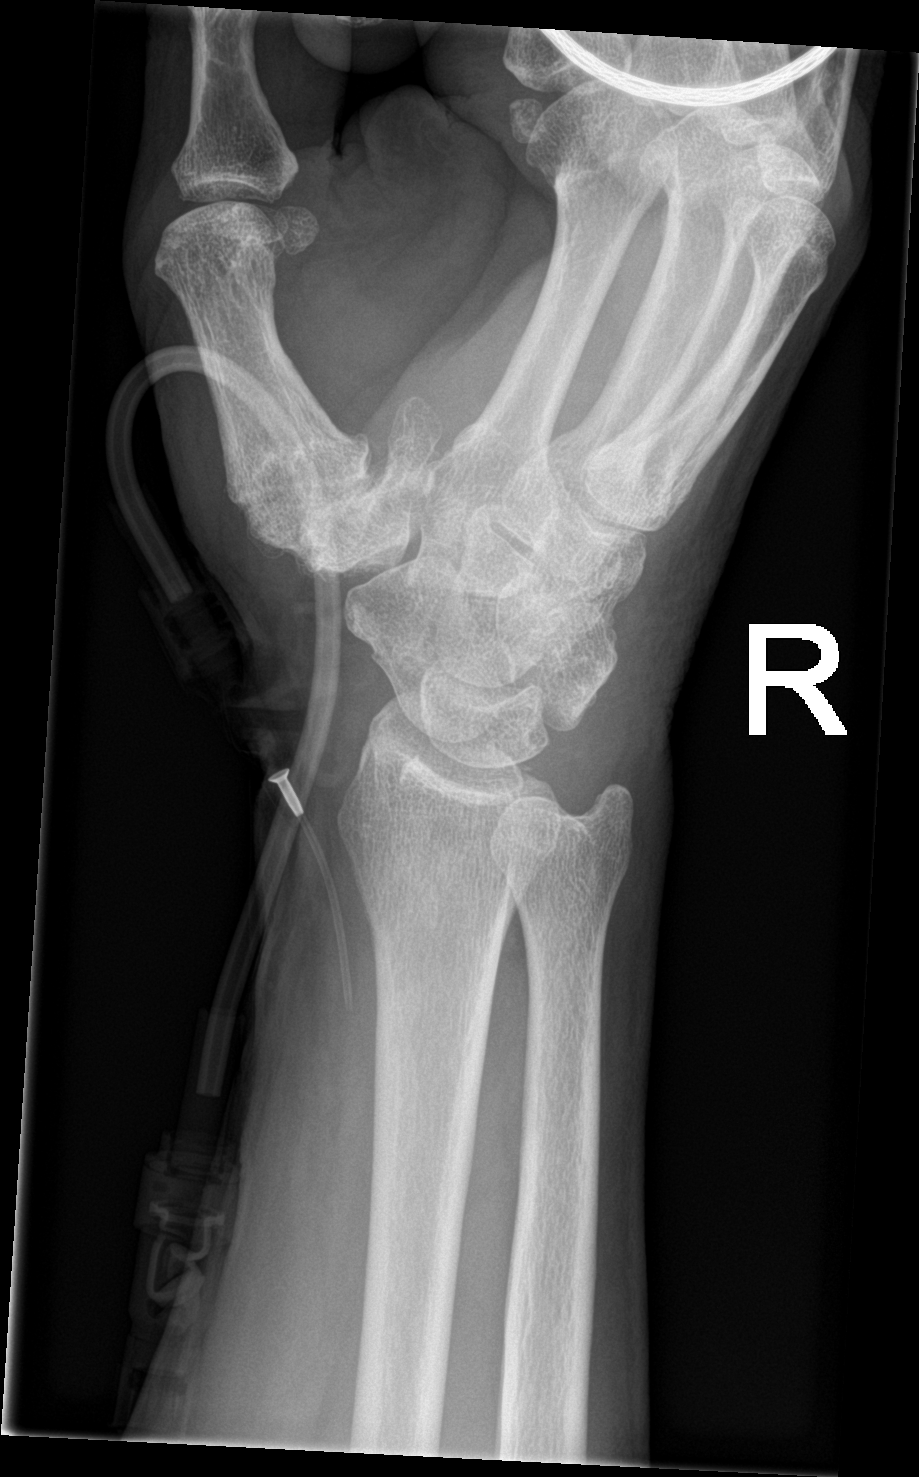

[wrist lat]
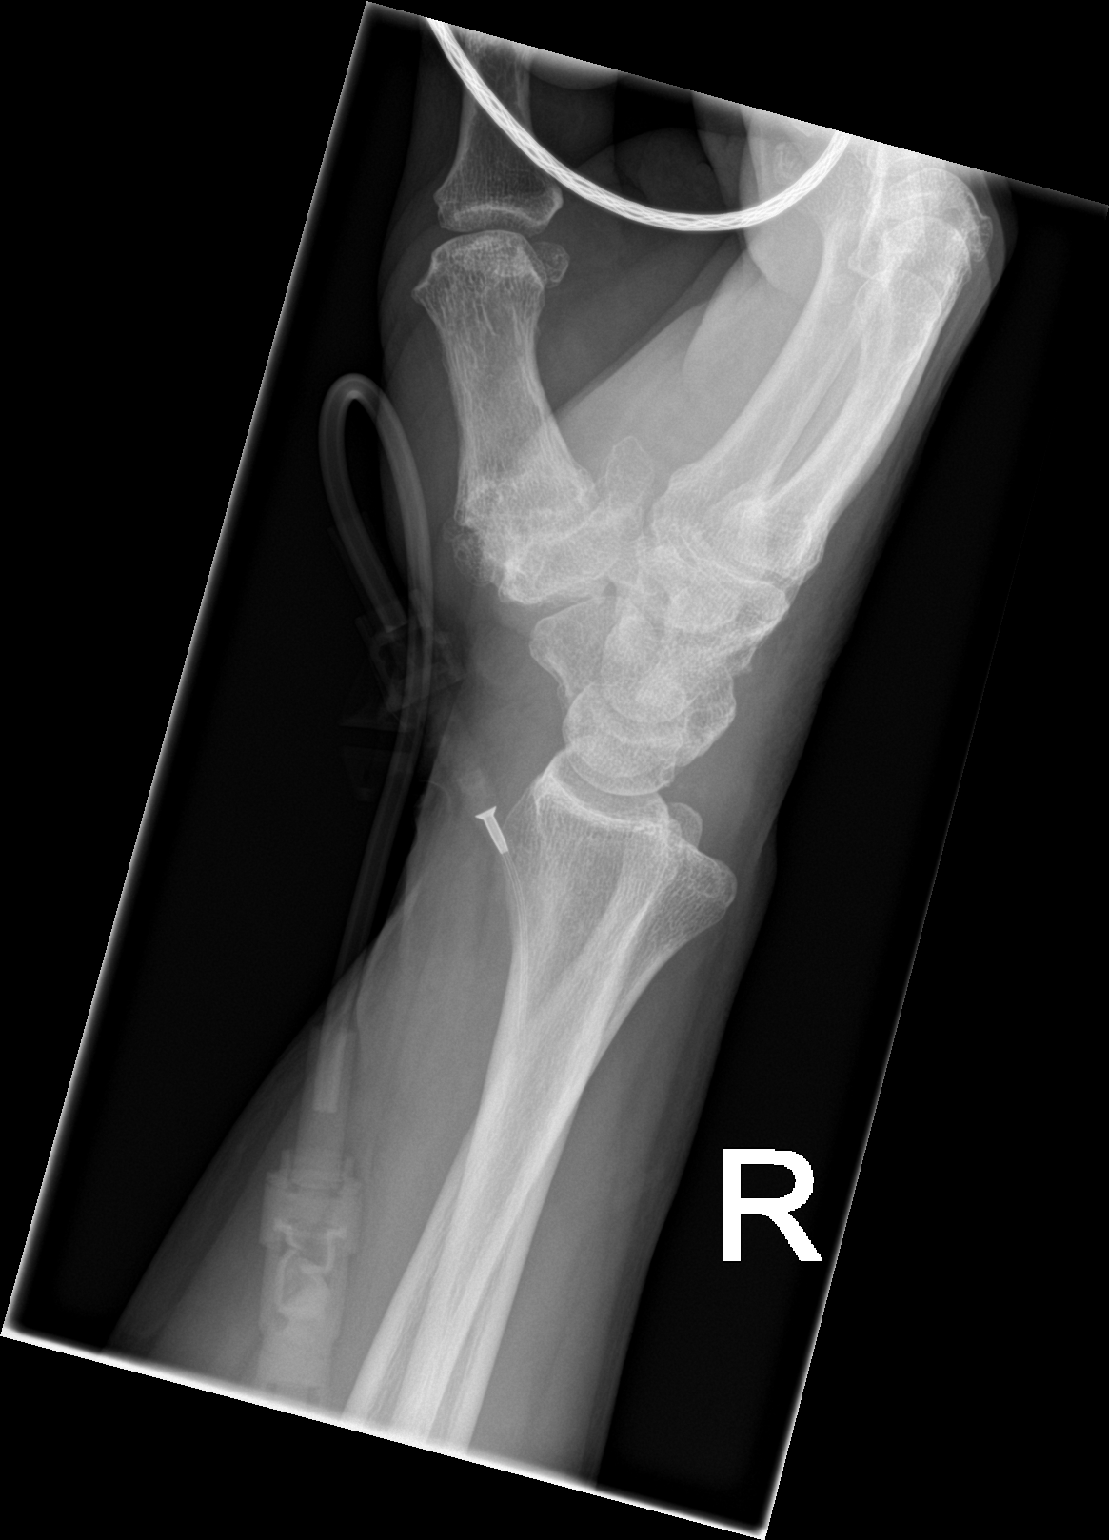

[wrist navicular]
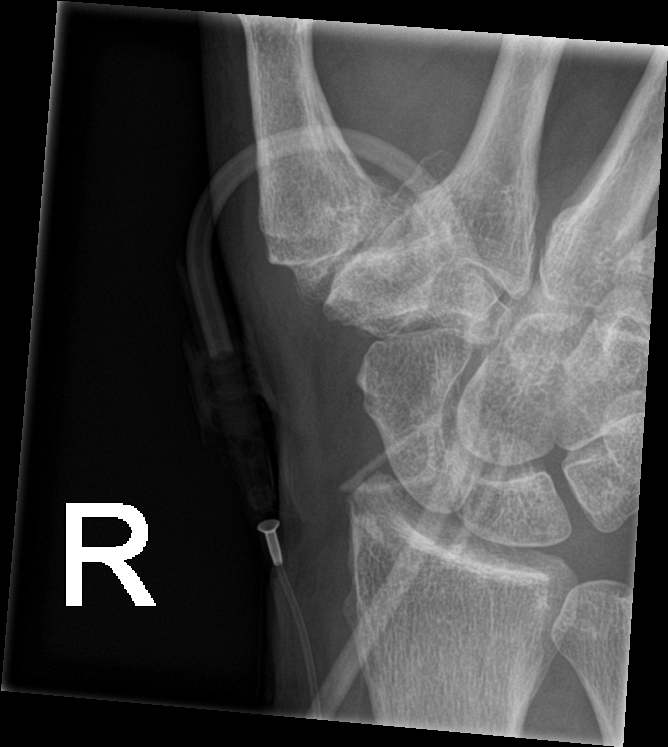

[4 of 4 positions shown; findings below may reference images not displayed]

FINDINGS: There is no evidence of fracture or dislocation. There is no
evidence of arthropathy or other focal bone abnormality. Soft
tissues are unremarkable. Scapholunate distance at upper limits of
normal measuring 3 mm. Degenerative change noted at the first
carpometacarpal joint.
IMPRESSION: No acute osseous abnormality.

Scapholunate distance of 3 mm at upper limits of normal. Correlate
for point tenderness to determine chronicity.

## 2016-03-21 ENCOUNTER — Ambulatory Visit
Admission: RE | Admit: 2016-03-21 | Discharge: 2016-03-21 | Disposition: A | Discharge status: Home / Self Care | Payer: Medicare Other | Source: Ambulatory Visit | Attending: Internal Medicine | Admitting: Internal Medicine

## 2016-03-21 ENCOUNTER — Other Ambulatory Visit: Payer: Self-pay | Admitting: Internal Medicine

## 2016-03-21 DIAGNOSIS — R1319 Other dysphagia: Secondary | ICD-10-CM

## 2016-03-21 DIAGNOSIS — K222 Esophageal obstruction: Secondary | ICD-10-CM

## 2016-03-21 DIAGNOSIS — K219 Gastro-esophageal reflux disease without esophagitis: Secondary | ICD-10-CM

## 2016-03-23 ENCOUNTER — Encounter: Payer: Self-pay | Admitting: Internal Medicine

## 2016-03-23 ENCOUNTER — Ambulatory Visit (INDEPENDENT_AMBULATORY_CARE_PROVIDER_SITE_OTHER): Payer: Medicare Other | Admitting: Internal Medicine

## 2016-03-23 ENCOUNTER — Telehealth: Payer: Self-pay | Admitting: Internal Medicine

## 2016-03-23 VITALS — BP 126/80 | HR 104 | Temp 97.2°F | Resp 16 | Ht 67.0 in | Wt 205.2 lb

## 2016-03-23 DIAGNOSIS — E782 Mixed hyperlipidemia: Secondary | ICD-10-CM | POA: Diagnosis not present

## 2016-03-23 DIAGNOSIS — D519 Vitamin B12 deficiency anemia, unspecified: Secondary | ICD-10-CM

## 2016-03-23 DIAGNOSIS — Z79899 Other long term (current) drug therapy: Secondary | ICD-10-CM

## 2016-03-23 DIAGNOSIS — Z125 Encounter for screening for malignant neoplasm of prostate: Secondary | ICD-10-CM | POA: Diagnosis not present

## 2016-03-23 DIAGNOSIS — Z136 Encounter for screening for cardiovascular disorders: Secondary | ICD-10-CM

## 2016-03-23 DIAGNOSIS — J449 Chronic obstructive pulmonary disease, unspecified: Secondary | ICD-10-CM

## 2016-03-23 DIAGNOSIS — E559 Vitamin D deficiency, unspecified: Secondary | ICD-10-CM

## 2016-03-23 DIAGNOSIS — G8929 Other chronic pain: Secondary | ICD-10-CM | POA: Diagnosis not present

## 2016-03-23 DIAGNOSIS — D500 Iron deficiency anemia secondary to blood loss (chronic): Secondary | ICD-10-CM | POA: Diagnosis not present

## 2016-03-23 DIAGNOSIS — Z0001 Encounter for general adult medical examination with abnormal findings: Secondary | ICD-10-CM

## 2016-03-23 DIAGNOSIS — K219 Gastro-esophageal reflux disease without esophagitis: Secondary | ICD-10-CM | POA: Diagnosis not present

## 2016-03-23 DIAGNOSIS — Z1212 Encounter for screening for malignant neoplasm of rectum: Secondary | ICD-10-CM

## 2016-03-23 DIAGNOSIS — M545 Low back pain: Secondary | ICD-10-CM

## 2016-03-23 DIAGNOSIS — R6889 Other general symptoms and signs: Secondary | ICD-10-CM | POA: Diagnosis not present

## 2016-03-23 DIAGNOSIS — I10 Essential (primary) hypertension: Secondary | ICD-10-CM | POA: Diagnosis not present

## 2016-03-23 DIAGNOSIS — E0800 Diabetes mellitus due to underlying condition with hyperosmolarity without nonketotic hyperglycemic-hyperosmolar coma (NKHHC): Secondary | ICD-10-CM

## 2016-03-23 LAB — CBC WITH DIFFERENTIAL/PLATELET
BASOS PCT: 0 %
Basophils Absolute: 0 cells/uL (ref 0–200)
EOS ABS: 240 {cells}/uL (ref 15–500)
Eosinophils Relative: 4 %
HEMATOCRIT: 40.3 % (ref 38.5–50.0)
Hemoglobin: 13.4 g/dL (ref 13.2–17.1)
LYMPHS PCT: 25 %
Lymphs Abs: 1500 cells/uL (ref 850–3900)
MCH: 29.3 pg (ref 27.0–33.0)
MCHC: 33.3 g/dL (ref 32.0–36.0)
MCV: 88.2 fL (ref 80.0–100.0)
MONO ABS: 300 {cells}/uL (ref 200–950)
MPV: 9.7 fL (ref 7.5–12.5)
Monocytes Relative: 5 %
NEUTROS PCT: 66 %
Neutro Abs: 3960 cells/uL (ref 1500–7800)
Platelets: 303 10*3/uL (ref 140–400)
RBC: 4.57 MIL/uL (ref 4.20–5.80)
RDW: 15.6 % — AB (ref 11.0–15.0)
WBC: 6 10*3/uL (ref 3.8–10.8)

## 2016-03-23 LAB — PSA: PSA: 0.3 ng/mL (ref <=4.0)

## 2016-03-23 LAB — VITAMIN B12: VITAMIN B 12: 241 pg/mL (ref 200–1100)

## 2016-03-23 LAB — TSH: TSH: 2.6 m[IU]/L (ref 0.40–4.50)

## 2016-03-23 NOTE — Patient Instructions (Signed)
Heartburn Heartburn is a type of pain or discomfort that can happen in the throat or chest. It is often described as a burning pain. It may also cause a bad taste in the mouth. Heartburn may feel worse when you lie down or bend over, and it is often worse at night. Heartburn may be caused by stomach contents that move back up into the esophagus (reflux). Follow these instructions at home: Take these actions to decrease your discomfort and to help avoid complications. Diet  Follow a diet as recommended by your health care provider. This may involve avoiding foods and drinks such as:  Coffee and tea (with or without caffeine).  Drinks that contain alcohol.  Energy drinks and sports drinks.  Carbonated drinks or sodas.  Chocolate and cocoa.  Peppermint and mint flavorings.  Garlic and onions.  Horseradish.  Spicy and acidic foods, including peppers, chili powder, curry powder, vinegar, hot sauces, and barbecue sauce.  Citrus fruit juices and citrus fruits, such as oranges, lemons, and limes.  Tomato-based foods, such as red sauce, chili, salsa, and pizza with red sauce.  Fried and fatty foods, such as donuts, french fries, potato chips, and high-fat dressings.  High-fat meats, such as hot dogs and fatty cuts of red and white meats, such as rib eye steak, sausage, ham, and bacon.  High-fat dairy items, such as whole milk, butter, and cream cheese.  Eat small, frequent meals instead of large meals.  Avoid drinking large amounts of liquid with your meals.  Avoid eating meals during the 2-3 hours before bedtime.  Avoid lying down right after you eat.  Do not exercise right after you eat. General instructions  Pay attention to any changes in your symptoms.  Take over-the-counter and prescription medicines only as told by your health care provider. Do not take aspirin, ibuprofen, or other NSAIDs unless your health care provider told you to do so.  Do not use any tobacco  products, including cigarettes, chewing tobacco, and e-cigarettes. If you need help quitting, ask your health care provider.  Wear loose-fitting clothing. Do not wear anything tight around your waist that causes pressure on your abdomen.  Raise (elevate) the head of your bed about 6 inches (15 cm).  Try to reduce your stress, such as with yoga or meditation. If you need help reducing stress, ask your health care provider.  If you are overweight, reduce your weight to an amount that is healthy for you. Ask your health care provider for guidance about a safe weight loss goal.  Keep all follow-up visits as told by your health care provider. This is important. Contact a health care provider if:  You have new symptoms.  You have unexplained weight loss.  You have difficulty swallowing, or it hurts to swallow.  You have wheezing or a persistent cough.  Your symptoms do not improve with treatment.  You have frequent heartburn for more than two weeks. Get help right away if:  You have pain in your arms, neck, jaw, teeth, or back.  You feel sweaty, dizzy, or light-headed.  You have chest pain or shortness of breath.  You vomit and your vomit looks like blood or coffee grounds.  Your stool is bloody or black. This information is not intended to replace advice given to you by your health care provider. Make sure you discuss any questions you have with your health care provider. Document Released: 08/14/2008 Document Revised: 09/03/2015 Document Reviewed: 07/23/2014 Elsevier Interactive Patient Education  2017 [employer]  Inc[employer]  ++++++++++++++++++++++++++++++++ Food Choices for Gastroesophageal Reflux Disease, Adult When you have gastroesophageal reflux disease (GERD), the foods you eat and your eating habits are very important. Choosing the right foods can help ease the discomfort of GERD. What general guidelines do I need to follow?  Choose fruits, vegetables, whole grains, low-fat  dairy products, and low-fat meat, fish, and poultry.  Limit fats such as oils, salad dressings, butter, nuts, and avocado.  Keep a food diary to identify foods that cause symptoms.  Avoid foods that cause reflux. These may be different for different people.  Eat frequent small meals instead of three large meals each day.  Eat your meals slowly, in a relaxed setting.  Limit fried foods.  Cook foods using methods other than frying.  Avoid drinking alcohol.  Avoid drinking large amounts of liquids with your meals.  Avoid bending over or lying down until 2-3 hours after eating. What foods are not recommended? The following are some foods and drinks that may worsen your symptoms: Vegetables  Tomatoes. Tomato juice. Tomato and spaghetti sauce. Chili peppers. Onion and garlic. Horseradish. Fruits  Oranges, grapefruit, and lemon (fruit and juice). Meats  High-fat meats, fish, and poultry. This includes hot dogs, ribs, ham, sausage, salami, and bacon. Dairy  Whole milk and chocolate milk. Sour cream. Cream. Butter. Ice cream. Cream cheese. Beverages  Coffee and tea, with or without caffeine. Carbonated beverages or energy drinks. Condiments  Hot sauce. Barbecue sauce. Sweets/Desserts  Chocolate and cocoa. Donuts. Peppermint and spearmint. Fats and Oils  High-fat foods, including [place] fries and potato chips. Other  Vinegar. Strong spices, such as black pepper, white pepper, red pepper, cayenne, curry powder, cloves, ginger, and chili powder. The items listed above may not be a complete list of foods and beverages to avoid. Contact your dietitian for more information.  This information is not intended to replace advice given to you by your health care provider. Make sure you discuss any questions you have with your health care provider. Document Released: 03/28/2005 Document Revised: 09/03/2015 Document Reviewed: 01/30/2013 Elsevier Interactive Patient Education  2017 [employer]  Inc[employer] ++++++++++++++++++++++++++ Recommend Adult Low Dose Aspirin or  coated  Aspirin 81 mg daily  To reduce risk of Colon Cancer 20 %,  Skin Cancer 26 % ,  Melanoma 46%  and  Pancreatic cancer 60% +++++++++++++++++++++++++ Vitamin D goal  is between 70-100.  Please make sure that you are taking your Vitamin D as directed.  It is very important as a natural anti-inflammatory  helping hair, skin, and nails, as well as reducing stroke and heart attack risk.  It helps your bones and helps with mood. It also decreases numerous cancer risks so please take it as directed.  Low Vit D is associated with a 200-300% higher risk for CANCER  and 200-300% higher risk for HEART   ATTACK  &  STROKE.   .....................................[name] It is also associated with higher death rate at younger ages,  autoimmune diseases like Rheumatoid arthritis, Lupus, Multiple Sclerosis.    Also many other serious conditions, like depression, Alzheimer's Dementia, infertility, muscle aches, fatigue, fibromyalgia - just to name a few. ++++++++++++++++++++ Recommend the book "The END of DIETING" by Dr Excell Seltzer  & the book "The END of DIABETES " by Dr Excell Seltzer At [email or web address] - get book & Audio CD's    Being diabetic has a  300% increased risk for heart attack, stroke, cancer, and alzheimer- type vascular dementia. It is very important that you  work harder with diet by avoiding all foods that are white. Avoid white rice (brown & wild rice is OK), white potatoes (sweetpotatoes in moderation is OK), White bread or wheat bread or anything made out of white flour like bagels, donuts, rolls, buns, biscuits, cakes, pastries, cookies, pizza crust, and pasta (made from white flour & egg whites) - vegetarian pasta or spinach or wheat pasta is OK. Multigrain breads like Arnold's or Pepperidge Farm, or multigrain sandwich thins or flatbreads.  Diet, exercise and weight loss can reverse and cure diabetes in the early stages.   Diet, exercise and weight loss is very important in the control and prevention of complications of diabetes which affects every system in your body, ie. Brain - dementia/stroke, eyes - glaucoma/blindness, heart - heart attack/heart failure, kidneys - dialysis, stomach - gastric paralysis, intestines - malabsorption, nerves - severe painful neuritis, circulation - gangrene & loss of a leg(s), and finally cancer and Alzheimers.    I recommend avoid fried & greasy foods,  sweets/candy, white rice (brown or wild rice or Quinoa is OK), white potatoes (sweet potatoes are OK) - anything made from white flour - bagels, doughnuts, rolls, buns, biscuits,white and wheat breads, pizza crust and traditional pasta made of white flour & egg white(vegetarian pasta or spinach or wheat pasta is OK).  Multi-grain bread is OK - like multi-grain flat bread or sandwich thins. Avoid alcohol in excess. Exercise is also important.    Eat all the vegetables you want - avoid meat, especially red meat and dairy - especially cheese.  Cheese is the most concentrated form of trans-fats which is the worst thing to clog up our arteries. Veggie cheese is OK which can be found in the fresh produce section at [employer] or [employer] or [employer]  +++++++++++++++++++++ DASH Eating Plan  DASH stands for "Dietary Approaches to Stop Hypertension."   The DASH eating plan is a healthy eating plan that has been shown to reduce high blood pressure (hypertension). Additional health benefits may include reducing the risk of type 2 diabetes mellitus, heart disease, and stroke. The DASH eating plan may also help with weight loss. WHAT DO I NEED TO KNOW ABOUT THE DASH EATING PLAN? For the DASH eating plan, you will follow these general guidelines:  Choose foods with a percent daily value for sodium of less than 5% (as listed on the food label).  Use salt-free seasonings or herbs instead of table salt or sea salt.  Check with your health  care provider or pharmacist before using salt substitutes.  Eat lower-sodium products, often labeled as "lower sodium" or "no salt added."  Eat fresh foods.  Eat more vegetables, fruits, and low-fat dairy products.  Choose whole grains. Look for the word "whole" as the first word in the ingredient list.  Choose fish   Limit sweets, desserts, sugars, and sugary drinks.  Choose heart-healthy fats.  Eat veggie cheese   Eat more home-cooked food and less restaurant, buffet, and fast food.  Limit fried foods.  Cook foods using methods other than frying.  Limit canned vegetables. If you do use them, rinse them well to decrease the sodium.  When eating at a restaurant, ask that your food be prepared with less salt, or no salt if possible.                      WHAT FOODS CAN I EAT? Read Dr Fara Olden Fuhrman's books on The End of Dieting & The End  of Diabetes  Grains Whole grain or whole wheat bread. Brown rice. Whole grain or whole wheat pasta. Quinoa, bulgur, and whole grain cereals. Low-sodium cereals. Corn or whole wheat flour tortillas. Whole grain cornbread. Whole grain crackers. Low-sodium crackers.  Vegetables Fresh or frozen vegetables (raw, steamed, roasted, or grilled). Low-sodium or reduced-sodium tomato and vegetable juices. Low-sodium or reduced-sodium tomato sauce and paste. Low-sodium or reduced-sodium canned vegetables.   Fruits All fresh, canned (in natural juice), or frozen fruits.  Protein Products  All fish and seafood.  Dried beans, peas, or lentils. Unsalted nuts and seeds. Unsalted canned beans.  Dairy Low-fat dairy products, such as skim or 1% milk, 2% or reduced-fat cheeses, low-fat ricotta or cottage cheese, or plain low-fat yogurt. Low-sodium or reduced-sodium cheeses.  Fats and Oils Tub margarines without trans fats. Light or reduced-fat mayonnaise and salad dressings (reduced sodium). Avocado. Safflower, olive, or canola oils. Natural peanut or almond  butter.  Other Unsalted popcorn and pretzels. The items listed above may not be a complete list of recommended foods or beverages. Contact your dietitian for more options.  +++++++++++++++  WHAT FOODS ARE NOT RECOMMENDED? Grains/ White flour or wheat flour White bread. White pasta. White rice. Refined cornbread. Bagels and croissants. Crackers that contain trans fat.  Vegetables  Creamed or fried vegetables. Vegetables in a . Regular canned vegetables. Regular canned tomato sauce and paste. Regular tomato and vegetable juices.  Fruits Dried fruits. Canned fruit in light or heavy syrup. Fruit juice.  Meat and Other Protein Products Meat in general - RED meat & White meat.  Fatty cuts of meat. Ribs, chicken wings, all processed meats as bacon, sausage, bologna, salami, fatback, hot dogs, bratwurst and packaged luncheon meats.  Dairy Whole or 2% milk, cream, half-and-half, and cream cheese. Whole-fat or sweetened yogurt. Full-fat cheeses or blue cheese. Non-dairy creamers and whipped toppings. Processed cheese, cheese spreads, or cheese curds.  Condiments Onion and garlic salt, seasoned salt, table salt, and sea salt. Canned and packaged gravies. Worcestershire sauce. Tartar sauce. Barbecue sauce. Teriyaki sauce. Soy sauce, including reduced sodium. Steak sauce. Fish sauce. Oyster sauce. Cocktail sauce. Horseradish. Ketchup and mustard. Meat flavorings and tenderizers. Bouillon cubes. Hot sauce. Tabasco sauce. Marinades. Taco seasonings. Relishes.  Fats and Oils Butter, stick margarine, lard, shortening and bacon fat. Coconut, palm kernel, or palm oils. Regular salad dressings.  Pickles and olives. Salted popcorn and pretzels.  The items listed above may not be a complete list of foods and beverages to avoid.

## 2016-03-23 NOTE — Telephone Encounter (Signed)
Pt scheduled to see Tye Savoy NP tomorrow at 3pm. 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 Pt aware of appt.

## 2016-03-23 NOTE — Progress Notes (Signed)
[facility]   Unk Pinto, M.D.    Uvaldo Bristle. Silverio Lay, P.A.-C      Starlyn Skeans, P.A.-C  [facility]                [address] [place] [id number] Telephone [phone] Telefax [phone] Annual  Screening/Preventative Visit  & Comprehensive Evaluation & Examination     This very nice 70 y.o. MWM presents for a Screening/Preventative Visit & comprehensive evaluation and management of multiple medical co-morbidities.  Patient has been followed for HTN, T2_NIDDM  , Hyperlipidemia and Vitamin D Deficiency.     Patient relates an approximate 6 week hx/o nausea/retching, dysphagia, odynophagia and an approx 24# weight loss over the last 6 weeks and was seen recently in the Office by [employer], PA-C and started on Protonix, Reglan and Carafate and UGI/BaSw showed a distal esophageal stricture & pt's sx's of choking and dysphagia persist despite his new meds. Referral is in process to see Dr Henrene Pastor.      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 Currently he is on maintenance narcotic analgesics and  he has  a spinal cord stimulator since 2005 and a Fentanyl pump since about 2010. Patient underwent a lumbar fusion in Oct 2016 by Dr Rolena Infante.      HTN predates since 2010. Patient's BP has been controlled at home.  Today's BP is at goal - 126/80. Patient denies any cardiac symptoms as chest pain, palpitations, shortness of breath, dizziness or ankle swelling.     Patient has Chronic LBP Syndrome and is followed By Dr Mechele Dawley at a [place] pain clinic with a Fentanyl pump since 2010. He did have a spinal cord stimulator implanted in 2005, but no longer uses  it. His most recent surgery was in Oct 2016 for a Lumbar fusion.     Patient's hyperlipidemia is controlled with diet and medications. Patient denies myalgias or other medication SE's. Last lipids were at goal albeit elevated Trig's: Lab Results  Component Value Date   CHOL 196 12/10/2015   HDL 61 12/10/2015   LDLCALC 89 12/10/2015   LDLDIRECT 178.6 09/08/2009   TRIG 231 (H) 12/10/2015   CHOLHDL 3.2 12/10/2015      Patient has Morbid Obesity and consequent T2_NIDDM since 2007 and patient denies reactive hypoglycemic symptoms, visual blurring, diabetic polys or paresthesias. Control has been sub-optimal due to patient's very poor compliance and gluttonous overeating and he admits checking CBG's very infrequently about 1x/month which he admits is due to his denial of his Diabetes. Last A1c was not at goal: Lab Results  Component Value Date   HGBA1C 8.7 (H) 12/10/2015       Finally, patient has history of Vitamin D Deficiency of  "67" in Nov 2016 and last vitamin D was at goal: Lab Results  Component Value Date   VD25OH 59 09/08/2015   Current Outpatient Prescriptions on File Prior to Visit  Medication Sig  . atorvastatin  80 MG tablet Take 0.5 tab daily at 6 PM.  . celecoxib  200 MG capsule Take  2  times daily.  [name] VITAMIN  5000 UNITS TABS Take  2  times daily.  . cloNIDine  0.1 MG tablet Take  2  times daily.  . cyclobenzaprine  10 MG tablet Take  3  times daily as needed   . docusate  100 MG CAPS Take  2  times daily.  . DULoxetine  60 MG capsule Take 6 daily.   . folic acid ( 1 MG tablet Take  daily.  . Glimepiride 4 MG tablet TAKE 1 TAB TWICE DAILY WITH MEALS  . LIDODERM 5 % Place 1 patch onto the skin daily.  [name] LYRICA 150 MG capsule Take  2  times daily.   . metFORMIN 500 MG tablet TAKE TWO TAB TWICE DAILY  . methotrexate  2.5 MG tablet   . REGLAN 10 MG tablet Take 1 tab 3  times daily before meals.  . pantoprazole  40 MG tablet Take 1 tab daily.  . predniSONE  5 MG tablet  Take 1 tab 2  times daily with a meal.  . ranitidine300 MG tablet Take 1 tab at bedtime.  . sucralfate 1 g tablet Take 1 tab 4times daily.  . tamsulosin  0.4 MG CAPS capsule Take 1 cap daily after supper.  . traMADol 50 MG tablet Take 1 tab every 6 hrs as needed for moderate pain  . triamcinolone crm  0.1 % Apply 1 application topically 4  times daily.   Allergies  Allergen Reactions  . Codeine Itching  . Morphine And Related Other (See Comments)    hallucinations   Past Medical History:  Diagnosis Date  . Anemia   . Arthritis   . Blood transfusion   . Borderline hypertension   . Clotting disorder (HCC)    bleeds easily-no dx  . COPD (chronic obstructive pulmonary disease) ([place])    denies  . Depression    hx of   . Diabetes mellitus   . Diverticulosis of colon   . Full dentures   . Hx of colonic polyps   . Hyperlipidemia   . Lipoma   . Low back pain   . Mental disorder   . Obesity   . Pneumonia    hx  . RSD (reflex sympathetic dystrophy)   . Venous insufficiency    Health Maintenance  Topic Date Due  . Hepatitis C Screening  1946/04/10  . URINE MICROALBUMIN  02/28/1956  . ZOSTAVAX  02/27/2006  . FOOT EXAM  03/09/2016  . HEMOGLOBIN A1C  06/08/2016  . OPHTHALMOLOGY EXAM  07/07/2016  . COLONOSCOPY  01/25/2023  . TETANUS/TDAP  05/08/2023  . INFLUENZA VACCINE  Completed  . PNA vac Low Risk Adult  Completed   Immunization History  Administered Date(s) Administered  . Influenza Split 04/22/2011, 01/20/2012  . Influenza Whole 01/15/2007, 01/21/2009, 03/08/2010  . Influenza, High Dose Seasonal PF 02/07/2013, 12/10/2015  . Influenza-Unspecified 12/10/2013, 01/10/2015  . Pneumococcal Conjugate-13 06/09/2015  . Pneumococcal Polysaccharide-23 04/22/2011  . Tdap 04/11/2001, 05/07/2013   Past Surgical History:  Procedure Laterality Date  . BACK SURGERY  2005,2007  . C3-4 anterior cervical discectomy and fusion with plating at c3-4  05/2006   Dr. Arnoldo Morale  .  COLONOSCOPY W/ POLYPECTOMY    . excision of lipoma from right olecranon area  11/2000   Dr. Rise [name]  . KNEE ARTHROSCOPY     right knee  . KNEE ARTHROSCOPY Right 06/06/2014   Procedure: ARTHROSCOPY RIGHT KNEE WITH REMOVAL OF FIBROUS BANDS;  Surgeon: Kerin Salen, MD;  Location: [facility];  Service: Orthopedics;  Laterality: Right;  . LUMBAR LAMINECTOMY/DECOMPRESSION MICRODISCECTOMY N/A 01/08/2014   Procedure: L4-S1 Decompression with removal and reimplantation of spinal cord stimulator battery ;  Surgeon: Melina Schools, MD;  Location: [place] [facility];  Service: Orthopedics;  Laterality: N/A;  . microdiscectomy and decompression  05/2002   Dr. Tonita Cong  . morphine pump  2009   due to reaction to morphine/changed to fentanyl  . PAIN PUMP IMPLANTATION     with fentanyl  . spinal cord stimulator implanted     for pain per Dr. Maryruth Eve  . subcut pain pump implanted    . TONSILLECTOMY    . TOTAL KNEE ARTHROPLASTY  02/29/2012   Procedure: TOTAL KNEE ARTHROPLASTY;  Surgeon: Kerin Salen, MD;  Location: [place];  Service: Orthopedics;  Laterality: Right;   Family History  Problem Relation Age of Onset  . Cancer Father   . Colon cancer Neg Hx   . Esophageal cancer Neg Hx   . Stomach cancer Neg Hx   . Rectal cancer Neg Hx    Social History   Social History  . Marital status: Married    Spouse name: karen  . Number of children: 2  . Years of education: N/A   Occupational History  . retired Management consultant    Social History Main Topics  . Smoking status: Former Smoker    Packs/day: 3.00    Years: 40.00    Types: Cigarettes    Quit date: 05/25/2002  . Smokeless tobacco: Never Used  . Alcohol use No  . Drug use: No  . Sexual activity: No    ROS Constitutional: Denies fever, chills, weight loss/gain, headaches, insomnia,  night sweats or change in appetite. Does c/o fatigue. Eyes: Denies redness, blurred vision, diplopia, discharge, itchy or watery eyes.  ENT: Denies  discharge, congestion, post nasal drip, epistaxis, sore throat, earache, hearing loss, dental pain, Tinnitus, Vertigo, Sinus pain or snoring.  Cardio: Denies chest pain, palpitations, irregular heartbeat, syncope, dyspnea, diaphoresis, orthopnea, PND, claudication or edema Respiratory: denies cough, dyspnea, DOE, pleurisy, hoarseness, laryngitis or wheezing.  Gastrointestinal: Denies dysphagia, heartburn, reflux, water brash, pain, cramps, nausea, vomiting, bloating, diarrhea, constipation, hematemesis, melena, hematochezia, jaundice or hemorrhoids Genitourinary: Denies dysuria, frequency, urgency, nocturia, hesitancy, discharge, hematuria or flank pain Musculoskeletal: Denies arthralgia, myalgia, stiffness, Jt. Swelling, pain, limp or strain/sprain. Denies Falls. Skin: Denies puritis, rash, hives, warts, acne, eczema or change in skin lesion Neuro: No weakness, tremor, incoordination, spasms, paresthesia or pain Psychiatric: Denies confusion, memory loss or sensory loss. Denies Depression. Endocrine: Denies change in weight, skin, hair change, nocturia, and paresthesia, diabetic polys, visual blurring or hyper / hypo glycemic episodes.  Heme/Lymph: No excessive bleeding, bruising or enlarged lymph nodes.  Physical Exam  BP 126/80   Pulse (!) 104   Temp 97.2 F (36.2 C)   Resp 16   Ht 5\' 7"  (1.702 m)   Wt 205 lb 3.2 oz (93.1 kg)   BMI 32.14 kg/m   General Appearance: Over nourished w/ central obesity & in no apparent distress.  Eyes: PERRLA, EOMs, conjunctiva no swelling or erythema, normal fundi and vessels. Sinuses: No frontal/maxillary tenderness ENT/Mouth: EACs patent / TMs  nl. Nares clear without erythema, swelling, mucoid exudates. Oral hygiene is good. No erythema, swelling, or exudate. Tongue normal, non-obstructing. Tonsils not swollen or erythematous. Hearing normal.  Neck: Supple, thyroid normal. No bruits, nodes or JVD. Respiratory: Respiratory effort normal.  BS equal and  clear bilateral  without rales, rhonci, wheezing or stridor. Cardio: Heart sounds are normal with regular rate and rhythm and no murmurs, rubs or gallops. Peripheral pulses are normal and equal bilaterally without edema. No aortic or femoral bruits. Chest: symmetric with normal excursions and percussion.  Abdomen: Soft, with Nl bowel sounds. Nontender, no guarding, rebound, hernias, masses, or organomegaly.  Lymphatics: Non tender without lymphadenopathy.  Genitourinary: No hernias.Testes nl. DRE - prostate nl for age - smooth & firm w/o nodules. Musculoskeletal: Full ROM all peripheral extremities, joint stability, 5/5 strength, and normal gait. Skin: Warm and dry without rashes, lesions, cyanosis, clubbing or  ecchymosis.  Neuro: Cranial nerves intact, reflexes equal bilaterally. Normal muscle tone, no cerebellar symptoms. Sensation intact to touch , Vibratory and Monofilament to the toes bilaterally.  Pysch: Alert and oriented X 3 with normal affect, insight and judgment appropriate.   Assessment and Plan  1. Annual Preventative/Screening Exam    2. Essential hypertension  - Microalbumin / creatinine urine ratio - EKG 12-Lead - [place], RETROPERITNL ABD,  LTD - Urinalysis, Routine w reflex microscopic - CBC with Differential/Platelet - BASIC METABOLIC PANEL WITH GFR - TSH  3. Mixed hyperlipidemia  - EKG 12-Lead - [place], RETROPERITNL ABD,  LTD - Hepatic function panel - Lipid panel - TSH  4. Vitamin D deficiency  - Hemoglobin A1c - Insulin, random  5. Diabetes mellitus, uncontrolled due to poor dietary compliance  (HCC)  - Microalbumin / creatinine urine ratio - EKG 12-Lead - [place], RETROPERITNL ABD,  LTD - HM DIABETES FOOT EXAM - LOW EXTREMITY NEUR EXAM DOCUM - VITAMIN D 25 Hydroxy   6. Chronic obstructive pulmonary disease, unspecified COPD type ([place])   7. Chronic bilateral low back pain without sciatica   8. Gastroesophageal reflux disease without esophagitis   9.  Iron deficiency anemia due to chronic blood loss  - Iron and TIBC - CBC with Differential/Platelet  10. Anemia due to vitamin B12 deficiency, unspecified B12 deficiency type  - Vitamin B12 - CBC with Differential/Platelet  11. Screening for rectal cancer  - POC Hemoccult Bld/Stl   12. Prostate cancer screening  - PSA  13. Screening for ischemic heart disease  - EKG 12-Lead  14. Screening for AAA (aortic abdominal aneurysm)  - [place], RETROPERITNL ABD,  LTD  15. Medication management  - Urinalysis, Routine w reflex microscopic - CBC with Differential/Platelet - Hepatic function panel - Magnesium       Continue prudent diet as discussed, weight control, BP monitoring, regular exercise, and medications as discussed.  Discussed med effects and SE's. Routine screening labs and tests as requested with regular follow-up as recommended. Over 40 minutes of exam, counseling, chart review and high complex critical decision making was performed

## 2016-03-24 ENCOUNTER — Encounter: Payer: Self-pay | Admitting: Nurse Practitioner

## 2016-03-24 ENCOUNTER — Ambulatory Visit (INDEPENDENT_AMBULATORY_CARE_PROVIDER_SITE_OTHER): Payer: Medicare Other | Admitting: Nurse Practitioner

## 2016-03-24 VITALS — BP 138/80 | HR 110 | Ht 67.5 in | Wt 203.0 lb

## 2016-03-24 DIAGNOSIS — R634 Abnormal weight loss: Secondary | ICD-10-CM

## 2016-03-24 DIAGNOSIS — R933 Abnormal findings on diagnostic imaging of other parts of digestive tract: Secondary | ICD-10-CM | POA: Diagnosis not present

## 2016-03-24 DIAGNOSIS — R1310 Dysphagia, unspecified: Secondary | ICD-10-CM

## 2016-03-24 LAB — URINALYSIS, ROUTINE W REFLEX MICROSCOPIC
Bilirubin Urine: NEGATIVE
NITRITE: NEGATIVE
PROTEIN: NEGATIVE
SPECIFIC GRAVITY, URINE: 1.017 (ref 1.001–1.035)
pH: 5 (ref 5.0–8.0)

## 2016-03-24 LAB — URINALYSIS, MICROSCOPIC ONLY
Bacteria, UA: NONE SEEN [HPF]
CASTS: NONE SEEN [LPF]
CRYSTALS: NONE SEEN [HPF]
Yeast: NONE SEEN [HPF]

## 2016-03-24 LAB — HEPATIC FUNCTION PANEL
ALBUMIN: 4.1 g/dL (ref 3.6–5.1)
ALK PHOS: 84 U/L (ref 40–115)
ALT: 15 U/L (ref 9–46)
AST: 17 U/L (ref 10–35)
Bilirubin, Direct: 0.1 mg/dL (ref <=0.2)
Indirect Bilirubin: 0.6 mg/dL (ref 0.2–1.2)
TOTAL PROTEIN: 6.9 g/dL (ref 6.1–8.1)
Total Bilirubin: 0.7 mg/dL (ref 0.2–1.2)

## 2016-03-24 LAB — INSULIN, RANDOM: INSULIN: 5.8 u[IU]/mL (ref 2.0–19.6)

## 2016-03-24 LAB — IRON AND TIBC
%SAT: 17 % (ref 15–60)
IRON: 54 ug/dL (ref 50–180)
TIBC: 311 ug/dL (ref 250–425)
UIBC: 257 ug/dL (ref 125–400)

## 2016-03-24 LAB — BASIC METABOLIC PANEL WITH GFR
BUN: 6 mg/dL — AB (ref 7–25)
CO2: 23 mmol/L (ref 20–31)
CREATININE: 0.71 mg/dL (ref 0.70–1.18)
Calcium: 9.6 mg/dL (ref 8.6–10.3)
Chloride: 98 mmol/L (ref 98–110)
GFR, Est Non African American: >89 mL/min (ref >=60)
GLUCOSE: 258 mg/dL — AB (ref 65–99)
POTASSIUM: 4 mmol/L (ref 3.5–5.3)
Sodium: 139 mmol/L (ref 135–146)

## 2016-03-24 LAB — MICROALBUMIN / CREATININE URINE RATIO
Creatinine, Urine: 69 mg/dL (ref 20–370)
MICROALB/CREAT RATIO: 16 ug/mg{creat} (ref <30)
Microalb, Ur: 1.1 mg/dL

## 2016-03-24 LAB — HEMOGLOBIN A1C
Hgb A1c MFr Bld: 11.9 % — ABNORMAL HIGH (ref <5.7)
MEAN PLASMA GLUCOSE: 295 mg/dL

## 2016-03-24 LAB — LIPID PANEL
Cholesterol: 212 mg/dL — ABNORMAL HIGH (ref <200)
HDL: 35 mg/dL — ABNORMAL LOW (ref >40)
LDL CALC: 117 mg/dL — AB (ref <100)
TRIGLYCERIDES: 298 mg/dL — AB (ref <150)
Total CHOL/HDL Ratio: 6.1 Ratio — ABNORMAL HIGH (ref <5.0)
VLDL: 60 mg/dL — ABNORMAL HIGH (ref <30)

## 2016-03-24 LAB — VITAMIN D 25 HYDROXY (VIT D DEFICIENCY, FRACTURES): Vit D, 25-Hydroxy: 36 ng/mL (ref 30–100)

## 2016-03-24 LAB — MAGNESIUM: MAGNESIUM: 1.5 mg/dL (ref 1.5–2.5)

## 2016-03-24 NOTE — Patient Instructions (Signed)
If you are age 70 or older, your body mass index should be between 23-30. Your Body mass index is 31.33 kg/m. If this is out of the aforementioned range listed, please consider follow up with your Primary Care Provider.  If you are age 25 or younger, your body mass index should be between 19-25. Your Body mass index is 31.33 kg/m. If this is out of the aformentioned range listed, please consider follow up with your Primary Care Provider.   You have been scheduled for an endoscopy. Please follow written instructions given to you at your visit today. If you use inhalers (even only as needed), please bring them with you on the day of your procedure. Your physician has requested that you go to www.startemmi.com and enter the access code given to you at your visit today. This web site gives a general overview about your procedure. However, you should still follow specific instructions given to you by our office regarding your preparation for the procedure.  Thank you for choosing [facility]

## 2016-03-24 NOTE — Progress Notes (Signed)
HPI: Patient is a 70 year old male known to Dr. Henrene Pastor. He has a history of adenomatous colon polyps, last seen October 2014. He is referred now by PCP Dr. Unk Pinto, for evaluation of dysphagia and an abnormal barium swallow showing a smooth stricture of the distal esophagus with retention of a barium tablet. Patient has no previous history of dysphagia. His problems started a couple of months ago and now occur almost every time he eats. Patient has lost 20 pounds over the last couple of months. He chops his food finely, and eats slowly but doesn't make much difference.. Patient has no history of GERD.    Past Medical History:  Diagnosis Date  . Anemia   . Arthritis   . Blood transfusion   . Borderline hypertension   . Clotting disorder (HCC)    bleeds easily-no dx  . COPD (chronic obstructive pulmonary disease) ([place])    denies  . Depression    hx of   . Diabetes mellitus   . Diverticulosis of colon   . Full dentures   . Hx of colonic polyps   . Hyperlipidemia   . Lipoma   . Low back pain   . Mental disorder   . Obesity   . Pneumonia    hx  . RSD (reflex sympathetic dystrophy)   . Venous insufficiency      Past Surgical History:  Procedure Laterality Date  . BACK SURGERY  2005,2007  . C3-4 anterior cervical discectomy and fusion with plating at c3-4  05/2006   Dr. Arnoldo Morale  . COLONOSCOPY W/ POLYPECTOMY    . excision of lipoma from right olecranon area  11/2000   Dr. Rise [name]  . KNEE ARTHROSCOPY     right knee  . KNEE ARTHROSCOPY Right 06/06/2014   Procedure: ARTHROSCOPY RIGHT KNEE WITH REMOVAL OF FIBROUS BANDS;  Surgeon: Kerin Salen, MD;  Location: [facility];  Service: Orthopedics;  Laterality: Right;  . LUMBAR LAMINECTOMY/DECOMPRESSION MICRODISCECTOMY N/A 01/08/2014   Procedure: L4-S1 Decompression with removal and reimplantation of spinal cord stimulator battery ;  Surgeon: Melina Schools, MD;  Location: [facility];  Service: Orthopedics;   Laterality: N/A;  . microdiscectomy and decompression  05/2002   Dr. Tonita Cong  . morphine pump  2009   due to reaction to morphine/changed to fentanyl  . PAIN PUMP IMPLANTATION     with fentanyl  . spinal cord stimulator implanted     for pain per Dr. Maryruth Eve  . subcut pain pump implanted    . TONSILLECTOMY    . TOTAL KNEE ARTHROPLASTY  02/29/2012   Procedure: TOTAL KNEE ARTHROPLASTY;  Surgeon: Kerin Salen, MD;  Location: [facility] [facility];  Service: Orthopedics;  Laterality: Right;   Family History  Problem Relation Age of Onset  . Cancer Father   . Colon cancer Neg Hx   . Esophageal cancer Neg Hx   . Stomach cancer Neg Hx   . Rectal cancer Neg Hx    Social History  Substance Use Topics  . Smoking status: Former Smoker    Packs/day: 3.00    Years: 40.00    Types: Cigarettes    Quit date: 05/25/2002  . Smokeless tobacco: Never Used  . Alcohol use No   Current Outpatient Prescriptions  Medication Sig Dispense Refill  . atorvastatin (LIPITOR) 80 MG tablet Take 0.5 tablets (40 mg total) by mouth daily at 6 PM. 45 tablet 2  . celecoxib (CELEBREX) 200 MG capsule Take 200 mg  by mouth 2 (two) times daily.  2  . Cholecalciferol (VITAMIN D-3) 5000 UNITS TABS Take 5,000 Units by mouth 2 (two) times daily.    . cloNIDine (CATAPRES) 0.1 MG tablet Take 0.1 mg by mouth 2 (two) times daily.    . cyclobenzaprine (FLEXERIL) 10 MG tablet Take 10 mg by mouth 3 (three) times daily as needed for muscle spasms. Muscle spasm.    . docusate sodium 100 MG CAPS Take 100 mg by mouth 2 (two) times daily. 10 capsule 0  . DULoxetine (CYMBALTA) 60 MG capsule Take 60 mg by mouth daily.     . folic acid (FOLVITE) 1 MG tablet Take 1 mg by mouth daily.  1  . glimepiride (AMARYL) 4 MG tablet TAKE 1 TABLET TWICE DAILY WITH MEALS. DO NOT TAKE IF YOU DO NOT EAT. 180 tablet 1  . lidocaine (LIDODERM) 5 % Place 1 patch onto the skin daily. 30 patch 0  . LYRICA 150 MG capsule Take 150 mg by mouth 2 (two) times daily.     .  metFORMIN (GLUCOPHAGE) 500 MG tablet TAKE TWO TABLETS BY MOUTH TWICE DAILY 360 tablet 0  . methotrexate (RHEUMATREX) 2.5 MG tablet   1  . metoCLOPramide (REGLAN) 10 MG tablet Take 1 tablet (10 mg total) by mouth 3 (three) times daily before meals. 90 tablet 1  . pantoprazole (PROTONIX) 40 MG tablet Take 1 tablet (40 mg total) by mouth daily. 30 tablet 1  . PRESCRIPTION MEDICATION 0.5 mcg/mL by Pump Prime route continuous. Fentanyl 0.05mg /ml solution 5,000 mcg. Patient will bring dosage instructions with him. Daily dose 0.84mg .    . ranitidine (ZANTAC) 300 MG tablet Take 1 tablet (300 mg total) by mouth at bedtime. 90 tablet 0  . sucralfate (CARAFATE) 1 g tablet Take 1 tablet (1 g total) by mouth 4 (four) times daily. 120 tablet 1  . tamsulosin (FLOMAX) 0.4 MG CAPS capsule Take 1 capsule (0.4 mg total) by mouth daily after supper. 90 capsule 3  . traMADol (ULTRAM) 50 MG tablet Take 1 tablet (50 mg total) by mouth every 6 (six) hours as needed for moderate pain. pain 30 tablet 0  . triamcinolone cream (KENALOG) 0.1 % Apply 1 application topically 4 (four) times daily. 45 g 0   No current facility-administered medications for this visit.    Allergies  Allergen Reactions  . Codeine Itching  . Morphine And Related Other (See Comments)    hallucinations    Review of Systems: All systems reviewed and negative except where noted in HPI.    Physical Exam: BP 138/80   Pulse (!) 110   Ht 5' 7.5" (1.715 m)   Wt 203 lb (92.1 kg)   BMI 31.33 kg/m  Constitutional:  Well-developed, white male in no acute distress. Psychiatric: Normal mood and affect. Behavior is normal. HEENT: Normocephalic and atraumatic. Conjunctivae are normal. No scleral icterus. Neck supple.  Cardiovascular: Normal rate, regular rhythm.  Pulmonary/chest: Effort normal and breath sounds normal. No wheezing, rales or rhonchi. Abdominal: Soft, nondistended, nontender. Bowel sounds active throughout. There are no masses  palpable. No hepatomegaly. Extremities: no edema Lymphadenopathy: No cervical adenopathy noted. Neurological: Alert and oriented to person place and time. Skin: Skin is warm and dry. No rashes noted.   ASSESSMENT AND PLAN:  70 year old male with two-month history of dysphagia, mainly to solids and associated with 20 pound weight loss . Barium swallow shows a smooth stricture of the distal esophagus.  -Patient will be scheduled for EGD  with possible dilation. The risks and benefits of EGD were discussed and the patient agrees to proceed.   Chronic back pain. Patient has a nerve stimulator in his left buttock. He has a morphine pump in his right lower quadrant which currently contains fentanyl. He is followed by pain management, Dr. Thora Lance, NP  03/24/2016, 3:27 PM  Cc: Unk Pinto, MD

## 2016-03-28 NOTE — Progress Notes (Signed)
Agree with initial assessment and plans as outlined 

## 2016-03-29 ENCOUNTER — Other Ambulatory Visit: Payer: Self-pay | Admitting: Internal Medicine

## 2016-03-30 ENCOUNTER — Ambulatory Visit (AMBULATORY_SURGERY_CENTER): Payer: Medicare Other | Admitting: Internal Medicine

## 2016-03-30 ENCOUNTER — Encounter: Payer: Self-pay | Admitting: Internal Medicine

## 2016-03-30 VITALS — BP 130/78 | HR 91 | Temp 99.8°F | Resp 15 | Ht 67.5 in | Wt 203.0 lb

## 2016-03-30 DIAGNOSIS — K209 Esophagitis, unspecified without bleeding: Secondary | ICD-10-CM

## 2016-03-30 DIAGNOSIS — R933 Abnormal findings on diagnostic imaging of other parts of digestive tract: Secondary | ICD-10-CM | POA: Diagnosis not present

## 2016-03-30 DIAGNOSIS — R1310 Dysphagia, unspecified: Secondary | ICD-10-CM | POA: Diagnosis present

## 2016-03-30 DIAGNOSIS — R1319 Other dysphagia: Secondary | ICD-10-CM

## 2016-03-30 DIAGNOSIS — K222 Esophageal obstruction: Secondary | ICD-10-CM | POA: Diagnosis not present

## 2016-03-30 DIAGNOSIS — K219 Gastro-esophageal reflux disease without esophagitis: Secondary | ICD-10-CM

## 2016-03-30 LAB — GLUCOSE, CAPILLARY
GLUCOSE-CAPILLARY: 195 mg/dL — AB (ref 65–99)
Glucose-Capillary: 183 mg/dL — ABNORMAL HIGH (ref 65–99)

## 2016-03-30 MED ORDER — PANTOPRAZOLE SODIUM 40 MG PO TBEC: 40.0000 mg | DELAYED_RELEASE_TABLET | Freq: Two times a day (BID) | ORAL | 1 refills | Status: DC

## 2016-03-30 MED ORDER — SODIUM CHLORIDE 0.9 % IV SOLN: 500.0000 mL | INTRAVENOUS | Status: DC

## 2016-03-30 NOTE — Patient Instructions (Signed)
YOU HAD AN ENDOSCOPIC PROCEDURE TODAY AT [facility]:   Refer to the procedure report that was given to you for any specific questions about what was found during the examination.  If the procedure report does not answer your questions, please call your gastroenterologist to clarify.  If you requested that your care partner not be given the details of your procedure findings, then the procedure report has been included in a sealed envelope for you to review at your convenience later.  YOU SHOULD EXPECT: Some feelings of bloating in the abdomen. Passage of more gas than usual.  Walking can help get rid of the air that was put into your GI tract during the procedure and reduce the bloating.   Please Note:  You might notice some irritation and congestion in your nose or some drainage.  This is from the oxygen used during your procedure.  There is no need for concern and it should clear up in a day or so.  SYMPTOMS TO REPORT IMMEDIATELY:  Following upper endoscopy (EGD)  Vomiting of blood or coffee ground material  New chest pain or pain under the shoulder blades  Painful or persistently difficult swallowing  New shortness of breath  Fever of 100F or higher  Black, tarry-looking stools  For urgent or emergent issues, a gastroenterologist can be reached at any hour by calling [phone].   DIET:  Follow dilation diet- see handout.  Drink plenty of fluids but you should avoid alcoholic beverages for 24 hours.  ACTIVITY:  You should plan to take it easy for the rest of today and you should NOT DRIVE or use heavy machinery until tomorrow (because of the sedation medicines used during the test).    FOLLOW UP: Our staff will call the number listed on your records the next business day following your procedure to check on you and address any questions or concerns that you may have regarding the information given to you following your procedure. If we do not reach you, we will leave  a message.  However, if you are feeling well and you are not experiencing any problems, there is no need to return our call.  We will assume that you have returned to your regular daily activities without incident.  If any biopsies were taken you will be contacted by phone or by letter within the next 1-3 weeks.  Please call us at [phone] if you have not heard about the biopsies in 3 weeks.   SIGNATURES/CONFIDENTIALITY: You and/or your care partner have signed paperwork which will be entered into your electronic medical record.  These signatures attest to the fact that that the information above on your After Visit Summary has been reviewed and is understood.  Full responsibility of the confidentiality of this discharge information lies with you and/or your care-partner.  Increase Pantoprazole to twice daily- 1 tablet 30 minutes before breakfast and lunch  Follow dilation diet today

## 2016-03-30 NOTE — Op Note (Signed)
[facility] Patient Name: James Parsons Procedure Date: 03/30/2016 10:07 AM MRN: [id number] Endoscopist: Docia Chuck. Henrene Pastor , MD Age: 70 Referring MD:  Date of Birth: Sep 05, 1945 Gender: Male Account #: [id number] Procedure:                Upper GI endoscopy, with balloon dilation of the                            esophagus max 13.5 Indications:              Dysphagia, Abnormal cine-esophagram, Weight loss Medicines:                Monitored Anesthesia Care Procedure:                Pre-Anesthesia Assessment:                           - Prior to the procedure, a History and Physical                            was performed, and patient medications and                            allergies were reviewed. The patient's tolerance of                            previous anesthesia was also reviewed. The risks                            and benefits of the procedure and the sedation                            options and risks were discussed with the patient.                            All questions were answered, and informed consent                            was obtained. Prior Anticoagulants: The patient has                            taken no previous anticoagulant or antiplatelet                            agents. ASA Grade Assessment: II - A patient with                            mild systemic disease. After reviewing the risks                            and benefits, the patient was deemed in                            satisfactory condition to undergo the procedure.  After obtaining informed consent, the endoscope was                            passed under direct vision. Throughout the                            procedure, the patient's blood pressure, pulse, and                            oxygen saturations were monitored continuously. The                            Model GIF-HQ190 [id number]) scope was introduced                            through  the mouth, and advanced to the duodenal                            bulb. The upper GI endoscopy was accomplished                            without difficulty. The patient tolerated the                            procedure well. Scope In: Scope Out: Findings:                 One severe benign-appearing, intrinsic stenosis was                            found 40 cm from the incisors. Severe                            circumferential esophagitis for 3 cm proximal to                            this area. This measured 8 mm (inner diameter). The                            standard endoscope would not pass spontaneously. A                            TTS dilator was passed through the scope. Dilation                            with a 12-13.5-15 mm balloon dilator was performed                            to 13.5 mm. The dilation site was examined and                            showed moderate improvement in luminal narrowing.  The endoscope passed through easily and the                            remainder of the endoscopic inspection completed.                           The exam of the esophagus revealed a large caliber                            ring at the gastroesophageal junction (at 44 cm)                            There is no associated esophagitis or obvious                            Barrett's. The esophagus was otherwise normal.                           The stomach was normal except for incidental small                            gastric polyp and hiatal hernia.                           The examined duodenum was normal.                           The cardia and gastric fundus were normal on                            retroflexion. Complications:            No immediate complications. Estimated Blood Loss:     Estimated blood loss: none. Impression:               1. High-grade stricture of the esophagus with                            associated esophagitis  approximately 4 cm above the                            GE junction as described above. Status post                            dilation. Somewhat atypical but likely peptic in                            nature                           2. Large caliber ring at the gastroesophageal                            junction not dilated  3. Otherwise unremarkable EGD. Recommendation:           1. Post-dilation diet                           2. Take pantoprazole 40 mg TWICE daily. We can                            represcribe if you do not have this                           3. Chew food carefully                           4. Repeat upper endoscopy with repeat dilation in                            2-4 weeks in the [facility]. Docia Chuck. Henrene Pastor, MD 03/30/2016 10:34:06 AM This report has been signed electronically.

## 2016-03-30 NOTE — Progress Notes (Signed)
Called to room to assist during endoscopic procedure.  Patient ID and intended procedure confirmed with present staff. Received instructions for my participation in the procedure from the performing physician.  

## 2016-03-30 NOTE — Progress Notes (Signed)
To recovery, report to [facility]. RN, VSS

## 2016-03-31 ENCOUNTER — Telehealth: Payer: Self-pay

## 2016-03-31 NOTE — Telephone Encounter (Signed)
  Follow up Call-  Call back number 03/30/2016  Post procedure Call Back phone  # [phone]  Permission to leave phone message Yes  Some recent data might be hidden     Patient questions:  Do you have a fever, pain , or abdominal swelling? No. Pain Score  0 *  Have you tolerated food without any problems? Yes.    Have you been able to return to your normal activities? Yes.    Do you have any questions about your discharge instructions: Diet   No. Medications  No. Follow up visit  No.  Do you have questions or concerns about your Care? No.  Actions: * If pain score is 4 or above: No action needed, pain <4.

## 2016-04-22 ENCOUNTER — Inpatient Hospital Stay (HOSPITAL_COMMUNITY)
Admission: EM | Admit: 2016-04-22 | Discharge: 2016-04-26 | DRG: 286 | Disposition: A | Discharge status: Home / Self Care | Payer: Medicare Other | Attending: Internal Medicine | Admitting: Internal Medicine

## 2016-04-22 ENCOUNTER — Emergency Department (HOSPITAL_COMMUNITY): Payer: Medicare Other

## 2016-04-22 ENCOUNTER — Observation Stay (HOSPITAL_COMMUNITY): Payer: Medicare Other

## 2016-04-22 ENCOUNTER — Encounter (HOSPITAL_COMMUNITY): Payer: Self-pay | Admitting: Emergency Medicine

## 2016-04-22 DIAGNOSIS — E876 Hypokalemia: Secondary | ICD-10-CM | POA: Diagnosis not present

## 2016-04-22 DIAGNOSIS — R0602 Shortness of breath: Secondary | ICD-10-CM

## 2016-04-22 DIAGNOSIS — I10 Essential (primary) hypertension: Secondary | ICD-10-CM | POA: Diagnosis present

## 2016-04-22 DIAGNOSIS — J449 Chronic obstructive pulmonary disease, unspecified: Secondary | ICD-10-CM | POA: Diagnosis not present

## 2016-04-22 DIAGNOSIS — I5021 Acute systolic (congestive) heart failure: Secondary | ICD-10-CM | POA: Diagnosis present

## 2016-04-22 DIAGNOSIS — I872 Venous insufficiency (chronic) (peripheral): Secondary | ICD-10-CM | POA: Diagnosis present

## 2016-04-22 DIAGNOSIS — E782 Mixed hyperlipidemia: Secondary | ICD-10-CM

## 2016-04-22 DIAGNOSIS — K219 Gastro-esophageal reflux disease without esophagitis: Secondary | ICD-10-CM | POA: Diagnosis present

## 2016-04-22 DIAGNOSIS — I447 Left bundle-branch block, unspecified: Secondary | ICD-10-CM | POA: Diagnosis present

## 2016-04-22 DIAGNOSIS — Z9689 Presence of other specified functional implants: Secondary | ICD-10-CM

## 2016-04-22 DIAGNOSIS — I130 Hypertensive heart and chronic kidney disease with heart failure and stage 1 through stage 4 chronic kidney disease, or unspecified chronic kidney disease: Principal | ICD-10-CM | POA: Diagnosis present

## 2016-04-22 DIAGNOSIS — I429 Cardiomyopathy, unspecified: Secondary | ICD-10-CM | POA: Diagnosis present

## 2016-04-22 DIAGNOSIS — G894 Chronic pain syndrome: Secondary | ICD-10-CM | POA: Diagnosis present

## 2016-04-22 DIAGNOSIS — I509 Heart failure, unspecified: Secondary | ICD-10-CM | POA: Diagnosis not present

## 2016-04-22 DIAGNOSIS — R0789 Other chest pain: Secondary | ICD-10-CM | POA: Diagnosis not present

## 2016-04-22 DIAGNOSIS — R42 Dizziness and giddiness: Secondary | ICD-10-CM | POA: Diagnosis not present

## 2016-04-22 DIAGNOSIS — I2510 Atherosclerotic heart disease of native coronary artery without angina pectoris: Secondary | ICD-10-CM | POA: Diagnosis present

## 2016-04-22 DIAGNOSIS — Z96651 Presence of right artificial knee joint: Secondary | ICD-10-CM | POA: Diagnosis present

## 2016-04-22 DIAGNOSIS — N189 Chronic kidney disease, unspecified: Secondary | ICD-10-CM | POA: Diagnosis present

## 2016-04-22 DIAGNOSIS — I255 Ischemic cardiomyopathy: Secondary | ICD-10-CM | POA: Diagnosis not present

## 2016-04-22 DIAGNOSIS — E1121 Type 2 diabetes mellitus with diabetic nephropathy: Secondary | ICD-10-CM

## 2016-04-22 DIAGNOSIS — Z87891 Personal history of nicotine dependence: Secondary | ICD-10-CM

## 2016-04-22 DIAGNOSIS — E119 Type 2 diabetes mellitus without complications: Secondary | ICD-10-CM

## 2016-04-22 DIAGNOSIS — G9050 Complex regional pain syndrome I, unspecified: Secondary | ICD-10-CM | POA: Diagnosis present

## 2016-04-22 DIAGNOSIS — F329 Major depressive disorder, single episode, unspecified: Secondary | ICD-10-CM | POA: Diagnosis present

## 2016-04-22 DIAGNOSIS — E1122 Type 2 diabetes mellitus with diabetic chronic kidney disease: Secondary | ICD-10-CM | POA: Diagnosis present

## 2016-04-22 DIAGNOSIS — I272 Pulmonary hypertension, unspecified: Secondary | ICD-10-CM | POA: Diagnosis present

## 2016-04-22 DIAGNOSIS — J432 Centrilobular emphysema: Secondary | ICD-10-CM | POA: Diagnosis present

## 2016-04-22 DIAGNOSIS — E1165 Type 2 diabetes mellitus with hyperglycemia: Secondary | ICD-10-CM | POA: Diagnosis present

## 2016-04-22 DIAGNOSIS — J810 Acute pulmonary edema: Secondary | ICD-10-CM

## 2016-04-22 DIAGNOSIS — Z79899 Other long term (current) drug therapy: Secondary | ICD-10-CM

## 2016-04-22 DIAGNOSIS — I959 Hypotension, unspecified: Secondary | ICD-10-CM | POA: Diagnosis not present

## 2016-04-22 DIAGNOSIS — Z7984 Long term (current) use of oral hypoglycemic drugs: Secondary | ICD-10-CM

## 2016-04-22 DIAGNOSIS — R1013 Epigastric pain: Secondary | ICD-10-CM

## 2016-04-22 DIAGNOSIS — R55 Syncope and collapse: Secondary | ICD-10-CM | POA: Diagnosis not present

## 2016-04-22 DIAGNOSIS — E1169 Type 2 diabetes mellitus with other specified complication: Secondary | ICD-10-CM | POA: Diagnosis present

## 2016-04-22 LAB — BASIC METABOLIC PANEL
ANION GAP: 10 (ref 5–15)
BUN: 5 mg/dL — ABNORMAL LOW (ref 6–20)
CALCIUM: 9.5 mg/dL (ref 8.9–10.3)
CO2: 24 mmol/L (ref 22–32)
Chloride: 106 mmol/L (ref 101–111)
Creatinine, Ser: 0.65 mg/dL (ref 0.61–1.24)
GFR calc Af Amer: >60 mL/min (ref >60)
GLUCOSE: 169 mg/dL — AB (ref 65–99)
POTASSIUM: 3.9 mmol/L (ref 3.5–5.1)
SODIUM: 140 mmol/L (ref 135–145)

## 2016-04-22 LAB — CBC
HEMATOCRIT: 36.3 % — AB (ref 39.0–52.0)
HEMOGLOBIN: 12.3 g/dL — AB (ref 13.0–17.0)
MCH: 29.5 pg (ref 26.0–34.0)
MCHC: 33.9 g/dL (ref 30.0–36.0)
MCV: 87.1 fL (ref 78.0–100.0)
Platelets: 232 10*3/uL (ref 150–400)
RBC: 4.17 MIL/uL — ABNORMAL LOW (ref 4.22–5.81)
RDW: 15.1 % (ref 11.5–15.5)
WBC: 5.8 10*3/uL (ref 4.0–10.5)

## 2016-04-22 LAB — TROPONIN I: Troponin I: 0.03 ng/mL (ref <0.03)

## 2016-04-22 LAB — LIPASE, BLOOD: LIPASE: 25 U/L (ref 11–51)

## 2016-04-22 LAB — HEPATIC FUNCTION PANEL
ALT: 12 U/L — ABNORMAL LOW (ref 17–63)
AST: 24 U/L (ref 15–41)
Albumin: 3.6 g/dL (ref 3.5–5.0)
Alkaline Phosphatase: 75 U/L (ref 38–126)
BILIRUBIN DIRECT: 0.2 mg/dL (ref 0.1–0.5)
Indirect Bilirubin: 0.5 mg/dL (ref 0.3–0.9)
TOTAL PROTEIN: 7.1 g/dL (ref 6.5–8.1)
Total Bilirubin: 0.7 mg/dL (ref 0.3–1.2)

## 2016-04-22 LAB — GLUCOSE, CAPILLARY: Glucose-Capillary: 140 mg/dL — ABNORMAL HIGH (ref 65–99)

## 2016-04-22 LAB — BRAIN NATRIURETIC PEPTIDE: B NATRIURETIC PEPTIDE 5: 155.7 pg/mL — AB (ref 0.0–100.0)

## 2016-04-22 LAB — TSH: TSH: 2.374 u[IU]/mL (ref 0.350–4.500)

## 2016-04-22 MED ORDER — ACETAMINOPHEN 325 MG PO TABS
650.0000 mg | ORAL_TABLET | Freq: Four times a day (QID) | ORAL | Status: DC | PRN
  Administered 2016-04-23 (×2): 650 mg via ORAL
  Filled 2016-04-22 (×2): qty 2

## 2016-04-22 MED ORDER — FOLIC ACID 1 MG PO TABS
1.0000 mg | ORAL_TABLET | Freq: Every day | ORAL | Status: DC
  Administered 2016-04-23 – 2016-04-26 (×4): 1 mg via ORAL
  Filled 2016-04-22 (×4): qty 1

## 2016-04-22 MED ORDER — ALBUTEROL SULFATE (2.5 MG/3ML) 0.083% IN NEBU: 2.5000 mg | INHALATION_SOLUTION | RESPIRATORY_TRACT | Status: DC | PRN

## 2016-04-22 MED ORDER — ORAL CARE MOUTH RINSE
15.0000 mL | Freq: Two times a day (BID) | OROMUCOSAL | Status: DC
  Administered 2016-04-22 – 2016-04-26 (×7): 15 mL via OROMUCOSAL

## 2016-04-22 MED ORDER — TAMSULOSIN HCL 0.4 MG PO CAPS
0.4000 mg | ORAL_CAPSULE | Freq: Every day | ORAL | Status: DC
  Administered 2016-04-23 – 2016-04-26 (×3): 0.4 mg via ORAL
  Filled 2016-04-22 (×3): qty 1

## 2016-04-22 MED ORDER — HYDRALAZINE HCL 20 MG/ML IJ SOLN: 5.0000 mg | Freq: Four times a day (QID) | INTRAMUSCULAR | Status: DC | PRN

## 2016-04-22 MED ORDER — CLONIDINE HCL 0.1 MG PO TABS
0.1000 mg | ORAL_TABLET | Freq: Two times a day (BID) | ORAL | Status: DC
  Administered 2016-04-22 – 2016-04-23 (×2): 0.1 mg via ORAL
  Filled 2016-04-22 (×2): qty 1

## 2016-04-22 MED ORDER — VITAMIN D 1000 UNITS PO TABS
5000.0000 [IU] | ORAL_TABLET | Freq: Two times a day (BID) | ORAL | Status: DC
  Administered 2016-04-22 – 2016-04-26 (×8): 5000 [IU] via ORAL
  Filled 2016-04-22 (×9): qty 5

## 2016-04-22 MED ORDER — SODIUM CHLORIDE 0.9% FLUSH
3.0000 mL | Freq: Two times a day (BID) | INTRAVENOUS | Status: DC
  Administered 2016-04-22 – 2016-04-26 (×8): 3 mL via INTRAVENOUS

## 2016-04-22 MED ORDER — ONDANSETRON HCL 4 MG/2ML IJ SOLN
4.0000 mg | Freq: Once | INTRAMUSCULAR | Status: AC
  Administered 2016-04-22: 4 mg via INTRAVENOUS
  Filled 2016-04-22: qty 2

## 2016-04-22 MED ORDER — DULOXETINE HCL 60 MG PO CPEP
60.0000 mg | ORAL_CAPSULE | Freq: Every day | ORAL | Status: DC
  Administered 2016-04-23 – 2016-04-26 (×4): 60 mg via ORAL
  Filled 2016-04-22 (×4): qty 1

## 2016-04-22 MED ORDER — IOPAMIDOL (ISOVUE-370) INJECTION 76%
INTRAVENOUS | Status: AC
  Administered 2016-04-22: 100 mL
  Filled 2016-04-22: qty 100

## 2016-04-22 MED ORDER — HYDROMORPHONE HCL 2 MG/ML IJ SOLN
0.5000 mg | Freq: Once | INTRAMUSCULAR | Status: AC
  Administered 2016-04-22: 0.5 mg via INTRAVENOUS
  Filled 2016-04-22: qty 1

## 2016-04-22 MED ORDER — CYCLOBENZAPRINE HCL 10 MG PO TABS
10.0000 mg | ORAL_TABLET | Freq: Three times a day (TID) | ORAL | Status: DC | PRN
  Administered 2016-04-25 (×2): 10 mg via ORAL
  Filled 2016-04-22 (×2): qty 1

## 2016-04-22 MED ORDER — INSULIN ASPART 100 UNIT/ML ~~LOC~~ SOLN
0.0000 [IU] | Freq: Three times a day (TID) | SUBCUTANEOUS | Status: DC
  Administered 2016-04-23: 2 [IU] via SUBCUTANEOUS
  Administered 2016-04-23: 3 [IU] via SUBCUTANEOUS
  Administered 2016-04-23: 1 [IU] via SUBCUTANEOUS
  Administered 2016-04-24: 2 [IU] via SUBCUTANEOUS
  Administered 2016-04-24: 1 [IU] via SUBCUTANEOUS
  Administered 2016-04-24: 3 [IU] via SUBCUTANEOUS
  Administered 2016-04-25: 2 [IU] via SUBCUTANEOUS
  Administered 2016-04-25: 1 [IU] via SUBCUTANEOUS
  Administered 2016-04-25 – 2016-04-26 (×3): 2 [IU] via SUBCUTANEOUS

## 2016-04-22 MED ORDER — METOCLOPRAMIDE HCL 10 MG PO TABS
10.0000 mg | ORAL_TABLET | Freq: Three times a day (TID) | ORAL | Status: DC
  Administered 2016-04-23 – 2016-04-26 (×11): 10 mg via ORAL
  Filled 2016-04-22 (×11): qty 1

## 2016-04-22 MED ORDER — DOCUSATE SODIUM 100 MG PO CAPS
100.0000 mg | ORAL_CAPSULE | Freq: Two times a day (BID) | ORAL | Status: DC
  Administered 2016-04-22 – 2016-04-26 (×8): 100 mg via ORAL
  Filled 2016-04-22 (×8): qty 1

## 2016-04-22 MED ORDER — FUROSEMIDE 10 MG/ML IJ SOLN
40.0000 mg | Freq: Once | INTRAMUSCULAR | Status: AC
  Administered 2016-04-22: 40 mg via INTRAVENOUS
  Filled 2016-04-22: qty 4

## 2016-04-22 MED ORDER — PANTOPRAZOLE SODIUM 40 MG PO TBEC
40.0000 mg | DELAYED_RELEASE_TABLET | Freq: Two times a day (BID) | ORAL | Status: DC
  Administered 2016-04-23 – 2016-04-26 (×7): 40 mg via ORAL
  Filled 2016-04-22 (×7): qty 1

## 2016-04-22 MED ORDER — FAMOTIDINE 20 MG PO TABS
40.0000 mg | ORAL_TABLET | Freq: Every day | ORAL | Status: DC
  Administered 2016-04-22 – 2016-04-25 (×4): 40 mg via ORAL
  Filled 2016-04-22 (×4): qty 2

## 2016-04-22 MED ORDER — ACETAMINOPHEN 650 MG RE SUPP: 650.0000 mg | Freq: Four times a day (QID) | RECTAL | Status: DC | PRN

## 2016-04-22 MED ORDER — ATORVASTATIN CALCIUM 40 MG PO TABS
40.0000 mg | ORAL_TABLET | Freq: Every day | ORAL | Status: DC
  Administered 2016-04-23 – 2016-04-26 (×3): 40 mg via ORAL
  Filled 2016-04-22 (×4): qty 1

## 2016-04-22 MED ORDER — ENOXAPARIN SODIUM 40 MG/0.4ML ~~LOC~~ SOLN
40.0000 mg | SUBCUTANEOUS | Status: DC
  Administered 2016-04-22 – 2016-04-24 (×3): 40 mg via SUBCUTANEOUS
  Filled 2016-04-22 (×3): qty 0.4

## 2016-04-22 MED ORDER — PREGABALIN 75 MG PO CAPS
150.0000 mg | ORAL_CAPSULE | Freq: Two times a day (BID) | ORAL | Status: DC
  Administered 2016-04-22 – 2016-04-26 (×8): 150 mg via ORAL
  Filled 2016-04-22 (×8): qty 2

## 2016-04-22 MED ORDER — FUROSEMIDE 10 MG/ML IJ SOLN
40.0000 mg | Freq: Two times a day (BID) | INTRAMUSCULAR | Status: DC
  Administered 2016-04-23: 40 mg via INTRAVENOUS
  Filled 2016-04-22: qty 4

## 2016-04-22 MED ORDER — ASPIRIN EC 81 MG PO TBEC
81.0000 mg | DELAYED_RELEASE_TABLET | Freq: Every day | ORAL | Status: DC
  Administered 2016-04-23 – 2016-04-26 (×4): 81 mg via ORAL
  Filled 2016-04-22 (×4): qty 1

## 2016-04-22 MED ORDER — TRAMADOL HCL 50 MG PO TABS
50.0000 mg | ORAL_TABLET | Freq: Four times a day (QID) | ORAL | Status: DC | PRN
  Administered 2016-04-25: 50 mg via ORAL
  Filled 2016-04-22: qty 1

## 2016-04-22 MED ORDER — SUCRALFATE 1 G PO TABS
1.0000 g | ORAL_TABLET | Freq: Four times a day (QID) | ORAL | Status: DC
  Administered 2016-04-22 – 2016-04-26 (×13): 1 g via ORAL
  Filled 2016-04-22 (×13): qty 1

## 2016-04-22 NOTE — H&P (Signed)
History and Physical    James Parsons [id number] DOB: May 19, 1945 DOA: 04/22/2016   PCP: Alesia Richards, MD  Outpatient Specialists:  Gastroenterology: Dr. Henrene Pastor. Pain management: Dr. Wylene Men.   Patient coming from: Home  Chief Complaint: Dyspnea  HPI: James Parsons is a 71 y.o. male , married, lives with spouse, ambulates with the help of a cane, physically not very active, PMH of DM 2, HTN, COPD not on home oxygen, former heavy smoker quit 15 years ago, depression, GERD, status post recent esophageal stricture dilatation recently, HLD, chronic back pain status post spinal stimulator and fentanyl pump management and outpatient pain management, presented to [facility] ED with complaints of dyspnea. He was in his usual state of health until 2 days ago when he started experiencing dyspnea on exertion upon returning from his mailbox to his house. This was associated with some midsternal chest discomfort that was nonradiating. No cough, fever or chills reported. Over the next 2 days, his symptoms have progressively gotten worse with reduction in his exertional distance. Intermittent chest discomfort, gets up to 7/10 in severity, nonradiating, mostly on exertion, relieved on rest. Has also noticed bilateral lower extremity edema. Denies sick contacts or recent long distance travel. Due to worsening symptoms today, he decided to come to the ED for further evaluation and management.  ED Course: Chest x-ray was suggestive of congestive heart failure but pneumonitis cannot be excluded. CTA chest: No PE small bilateral pleural effusions and interstitial pulmonary edema, mild to moderate lung apex centrilobular emphysema, mild cardiomegaly and pulmonary nodules. He received a dose of IV Lasix and has diuresed well with significant improvement in his dyspnea. No chest pain at this time.  Review of Systems:  All other systems reviewed and apart from HPI, are negative. As per family, patient  sustained a mechanical fall approximately a month ago and sustained right rib fractures. Patient denies pain in that area.  Past Medical History:  Diagnosis Date  . Allergy   . Anemia   . Arthritis   . Borderline hypertension   . Clotting disorder (HCC)    bleeds easily-no dx  . COPD (chronic obstructive pulmonary disease) ([place])    denies  . Depression    hx of   . Diabetes mellitus   . Diverticulosis of colon   . Full dentures   . GERD (gastroesophageal reflux disease)   . Hx of colonic polyps   . Hyperlipidemia   . Hypertension   . Lipoma   . Low back pain   . Mental disorder   . Obesity   . Pneumonia    hx  . RSD (reflex sympathetic dystrophy)   . Venous insufficiency     Past Surgical History:  Procedure Laterality Date  . BACK SURGERY  2005,2007  . C3-4 anterior cervical discectomy and fusion with plating at c3-4  05/2006   Dr. Arnoldo Morale  . COLONOSCOPY    . COLONOSCOPY W/ POLYPECTOMY    . excision of lipoma from right olecranon area  11/2000   Dr. Rise [name]  . KNEE ARTHROSCOPY     right knee  . KNEE ARTHROSCOPY Right 06/06/2014   Procedure: ARTHROSCOPY RIGHT KNEE WITH REMOVAL OF FIBROUS BANDS;  Surgeon: Kerin Salen, MD;  Location: [facility];  Service: Orthopedics;  Laterality: Right;  . LUMBAR LAMINECTOMY/DECOMPRESSION MICRODISCECTOMY N/A 01/08/2014   Procedure: L4-S1 Decompression with removal and reimplantation of spinal cord stimulator battery ;  Surgeon: Melina Schools, MD;  Location: [place];  Service:  Orthopedics;  Laterality: N/A;  . microdiscectomy and decompression  05/2002   Dr. Tonita Cong  . morphine pump  2009   due to reaction to morphine/changed to fentanyl  . PAIN PUMP IMPLANTATION     with fentanyl  . spinal cord stimulator implanted     for pain per Dr. Maryruth Eve  . subcut pain pump implanted    . TONSILLECTOMY    . TOTAL KNEE ARTHROPLASTY  02/29/2012   Procedure: TOTAL KNEE ARTHROPLASTY;  Surgeon: Kerin Salen, MD;  Location: [place];   Service: Orthopedics;  Laterality: Right;   Social history  reports that he quit smoking about 13 years ago. His smoking use included Cigarettes. He has a 120.00 pack-year smoking history. He has never used smokeless tobacco. He reports that he does not drink alcohol or use drugs.  Allergies  Allergen Reactions  . Codeine Itching  . Morphine And Related Other (See Comments)    hallucinations    Family History  Problem Relation Age of Onset  . Cancer Father   . Colon cancer Neg Hx   . Esophageal cancer Neg Hx   . Stomach cancer Neg Hx   . Rectal cancer Neg Hx      Prior to Admission medications   Medication Sig Start Date End Date Taking? Authorizing Provider  atorvastatin (LIPITOR) 80 MG tablet Take 1/2 to 1 tablet daily for cholesterol as directed 03/29/16 09/27/16 Yes Unk Pinto, MD  Cholecalciferol (VITAMIN D-3) 5000 UNITS TABS Take 5,000 Units by mouth 2 (two) times daily.   Yes Historical Provider, MD  cloNIDine (CATAPRES) 0.1 MG tablet Take 0.1 mg by mouth 2 (two) times daily.   Yes Historical Provider, MD  cyclobenzaprine (FLEXERIL) 10 MG tablet Take 10 mg by mouth 3 (three) times daily as needed for muscle spasms. Muscle spasm. 03/02/11  Yes Historical Provider, MD  docusate sodium 100 MG CAPS Take 100 mg by mouth 2 (two) times daily. 04/09/14  Yes Shanker Kristeen Mans, MD  DULoxetine (CYMBALTA) 60 MG capsule Take 60 mg by mouth daily.  10/09/12  Yes Historical Provider, MD  folic acid (FOLVITE) 1 MG tablet Take 1 mg by mouth daily. 08/13/14  Yes Historical Provider, MD  glimepiride (AMARYL) 4 MG tablet TAKE 1 TABLET TWICE DAILY WITH MEALS. DO NOT TAKE IF YOU DO NOT EAT. 12/26/15  Yes Unk Pinto, MD  lidocaine (LIDODERM) 5 % Place 1 patch onto the skin daily. Patient taking differently: Place 1 patch onto the skin daily as needed (pain).  04/09/14  Yes Shanker Kristeen Mans, MD  LYRICA 150 MG capsule Take 150 mg by mouth 2 (two) times daily.  02/21/11  Yes Historical Provider,  MD  metFORMIN (GLUCOPHAGE) 500 MG tablet TAKE TWO TABLETS BY MOUTH TWICE DAILY 02/24/16  Yes Vicie Mutters, PA-C  methotrexate (RHEUMATREX) 2.5 MG tablet Take 15 mg by mouth once a week. On Friday, 6 tablets 08/13/14  Yes Historical Provider, MD  metoCLOPramide (REGLAN) 10 MG tablet Take 1 tablet (10 mg total) by mouth 3 (three) times daily before meals. 03/17/16  Yes Courtney Forcucci, PA-C  pantoprazole (PROTONIX) 40 MG tablet Take 1 tablet (40 mg total) by mouth 2 (two) times daily before a meal. 03/30/16 03/30/17 Yes Irene Shipper, MD  PRESCRIPTION MEDICATION 0.5 mcg/mL by Pump Prime route continuous. Fentanyl 0.05mg /ml solution 5,000 mcg. Patient will bring dosage instructions with him. Daily dose 0.84mg .   Yes Historical Provider, MD  ranitidine (ZANTAC) 300 MG tablet Take 1 tablet (300 mg  total) by mouth at bedtime. 02/22/16 02/21/17 Yes Vicie Mutters, PA-C  sucralfate (CARAFATE) 1 g tablet Take 1 tablet (1 g total) by mouth 4 (four) times daily. 03/17/16 03/17/17 Yes Courtney Forcucci, PA-C  tamsulosin (FLOMAX) 0.4 MG CAPS capsule Take 1 capsule (0.4 mg total) by mouth daily after supper. 08/20/15  Yes Courtney Forcucci, PA-C  traMADol (ULTRAM) 50 MG tablet Take 1 tablet (50 mg total) by mouth every 6 (six) hours as needed for moderate pain. pain 04/09/14  Yes Shanker Kristeen Mans, MD  triamcinolone cream (KENALOG) 0.1 % Apply 1 application topically 4 (four) times daily. 09/08/15  Yes Unk Pinto, MD    Physical Exam: Vitals:   04/22/16 1445 04/22/16 1500 04/22/16 1631 04/22/16 1700  BP:   156/95 (!) 160/117  Pulse: 117 113  (!) 136  Resp: 19 19 17  (!) 27  Temp:      TempSrc:      SpO2: 97% 97% 99% 96%      Constitutional: Pleasant elderly male, moderately built and overweight, lying comfortably propped up in the gurney in the ED without distress. Eyes: PERTLA, lids and conjunctivae normal ENMT: Mucous membranes are moist. Posterior pharynx clear of any exudate or lesions. Normal  dentition.  Neck: normal, supple, no masses, no thyromegaly Respiratory: Reduced breath sounds in both bases with bibasilar crackles. Rest of lung fields clear to auscultation without wheezing or rhonchi. No increased work of breathing.  Cardiovascular: S1 & S2 heard, regular rate and rhythm, no murmurs / rubs / gallops. 1+ pitting bilateral ankle edema. 2+ pedal pulses. No carotid bruits.  Abdomen: No distension, no tenderness, no masses palpated. No hepatosplenomegaly. Bowel sounds normal. Pain pump over right abdomen subcutaneously. Musculoskeletal: no clubbing / cyanosis. No joint deformity upper and lower extremities. Good ROM, no contractures. Normal muscle tone.  Skin: no rashes, lesions, ulcers. No induration Neurologic: CN 2-12 grossly intact. Sensation intact, DTR normal. Strength 5/5 in all 4 limbs.  Psychiatric: Normal judgment and insight. Alert and oriented x 3. Normal mood.     Labs on Admission: I have personally reviewed following labs and imaging studies  CBC:  Recent Labs Lab 04/22/16 1322  WBC 5.8  HGB 12.3*  HCT 36.3*  MCV 87.1  PLT [id number]   Basic Metabolic Panel:  Recent Labs Lab 04/22/16 1322  NA 140  K 3.9  CL 106  CO2 24  GLUCOSE 169*  BUN 5*  CREATININE 0.65  CALCIUM 9.5   Liver Function Tests:  Recent Labs Lab 04/22/16 1322  AST 24  ALT 12*  ALKPHOS 75  BILITOT 0.7  PROT 7.1  ALBUMIN 3.6    Recent Labs Lab 04/22/16 1322  LIPASE 25     Radiological Exams on Admission: Dg Chest 2 View  Result Date: 04/22/2016 CLINICAL DATA:  Shortness of breath. EXAM: CHEST  2 VIEW COMPARISON:  02/14/2016 . FINDINGS: Cardiomegaly with diffuse bilateral pulmonary interstitial prominence and bilateral pleural effusions consistent congestive heart failure. Pneumonitis cannot be excluded . Findings have progressed from prior exam. Neurostimulator leads in stable position. IMPRESSION: Cardiomegaly with diffuse bilateral from interstitial prominence and  bilateral pleural effusions consistent congestive heart failure. Pneumonitis cannot be excluded. Findings have progressed from prior exam. Electronically Signed   By: [place]   On: 04/22/2016 14:10   Ct Angio Chest Pe W Or Wo Contrast  Result Date: 04/22/2016 CLINICAL DATA:  71 y/o  M; shortness of breath and tachycardia. EXAM: CT ANGIOGRAPHY CHEST WITH CONTRAST TECHNIQUE: Multidetector CT  imaging of the chest was performed using the standard protocol during bolus administration of intravenous contrast. Multiplanar CT image reconstructions and MIPs were obtained to evaluate the vascular anatomy. CONTRAST:  100 cc Isovue 370. COMPARISON:  04/09/2006 CT chest. FINDINGS: Cardiovascular: Satisfactory opacification of the pulmonary arteries to the segmental level. Respiratory motion artifact in the right greater than left lung bases. No evidence of pulmonary embolism. Mild cardiomegaly. Moderate coronary artery calcification. Normal caliber thoracic aorta with mild calcific atherosclerosis. Normal caliber main pulmonary artery. Mediastinum/Nodes: No enlarged mediastinal, hilar, or axillary lymph nodes. Thyroid gland, trachea, and esophagus demonstrate no significant findings. Lungs/Pleura: Clusters of nodules within the periphery of the lung in the left upper lobe and right upper lobe measuring up to 4 mm. Stable 5.6 mm nodule in the right lung apex. Biapical moderate centrilobular pulmonary emphysema. Interlobular septal thickening in hazy opacity of lung parenchyma probably represents interstitial pulmonary edema. Small bilateral pleural effusions. Upper Abdomen: Nonspecific prominent subcentimeter gastrohepatic lymph nodes. Musculoskeletal: 2 spinal stimulator electrodes are noted within the lower and mid thoracic spine. Mild dextrocurvature of the thoracic spine. No acute osseous abnormality is evident. Review of the MIP images confirms the above findings. IMPRESSION: 1. No pulmonary embolus identified.  Right greater than left lung base respiratory artifact. 2. Small bilateral pleural effusions and interstitial pulmonary edema. 3. Mild-to-moderate lung apex centrilobular emphysema. 4. Mild cardiomegaly.  Moderate coronary artery calcification. 5. Clusters of nodules within periphery of the lungs measuring up to 4 mm. No follow-up needed if patient is low-risk (and has no known or suspected primary neoplasm). Non-contrast chest CT can be considered in 12 months if patient is high-risk. This recommendation follows the consensus statement: Guidelines for Management of Incidental Pulmonary Nodules Detected on CT Images: From the Fleischner Society 2017; Radiology 2017; 284:228-243. Electronically Signed   By: Kristine Garbe M.D.   On: 04/22/2016 15:42    EKG: Independently reviewed. Sinus tachycardia to 119 bpm, LBBB (not new)  Assessment/Plan Principal Problem:   Acute exacerbation of CHF (congestive heart failure) (HCC) Active Problems:   Mixed hyperlipidemia   Essential hypertension   Venous (peripheral) insufficiency   COPD (chronic obstructive pulmonary disease) (HCC)   T2_NIDDM   Chronic pain associated with significant psychosocial dysfunction   Spinal cord stimulator status   RSD (reflex sympathetic dystrophy)     1. Acute exacerbation of CHF: Unknown type. No echo in system. Probably diastolic. Admitted to telemetry. Improved after a dose of IV Lasix 40 mg 1 in ED. Continue Lasix 40 mg IV twice a day. Strict intake output, daily weights. Check 2-D echo. 2. Chest pain, atypical: EKG without acute changes. As per ED, troponin has not crossed over but was 0.01. Cycle troponin. Aspirin 81 MG daily. 3. COPD: Patient's dyspnea may be a combination of decompensated CHF and underlying COPD as evidenced on CT chest. No PE noted. No clinical bronchospasm. When necessary oxygen and bronchodilators. 4. Essential hypertension: Continue clonidine. When necessary IV  hydralazine. 5. Hyperlipidemia: Continue statins. 6. Type II DM: Hold oral hypoglycemics. Check A1c. SSI. 7. Chronic pain: Continue home pain regimen. 8. GERD/status post recent esophageal dilatation: Continue home regimen of PPI, H2 blockers and sucralfate. Unsure of his atypical chest pain is GI in origin. Lipase normal. Abdominal ultrasound without acute findings.   DVT prophylaxis: Lovenox  Code Status: Full  Family Communication: Discussed in detail with patient's spouse and son at bedside.  Disposition Plan: DC home when medically stable  Consults called: None  Admission  status: Telemetry, observation.    [facility] MD [facility] Pager [phone]  If 7PM-7AM, please contact night-coverage [email or web address] Password [facility]  04/22/2016, 6:00 PM

## 2016-04-22 NOTE — Progress Notes (Signed)
Pt educated about safety and bed alarm during the night. Pt refuses bed alarm during this time. Will continue to monitor.  Zen Felling, RN

## 2016-04-22 NOTE — ED Notes (Signed)
Report was given to the floor 3E RN.  Patient stable to be taken to the floor on telemetry at this time. Belongings taken with the patient to the floor.

## 2016-04-22 NOTE — Progress Notes (Signed)
CRITICAL VALUE ALERT  Critical value received:  Troponin  Date of notification:  04/22/2016  Time of notification:  2100  Critical value read back:Yes.    Nurse who received alert:  Arriyana Rodell  MD notified (1st page):  Yes, Jonette [facility]  Time of first page:  2110  MD notified (2nd page):  Time of second page:  Responding MD:  Jonette [facility]  Time MD responded:  2115  No new orders at this time. Will continue to monitor.   Cashius Grandstaff, RN

## 2016-04-22 NOTE — ED Triage Notes (Signed)
Sob and slight cp since yesterday

## 2016-04-22 NOTE — ED Notes (Signed)
Patient taken to CT.

## 2016-04-22 NOTE — ED Provider Notes (Signed)
[facility] DEPT Provider Note   CSN: [id number] Arrival date & time: 04/22/16  1255     History   Chief Complaint Chief Complaint  Patient presents with  . Shortness of Breath    HPI James Parsons is a 71 y.o. male.  Patient with history of hypertension, chronic back pain (has fentanyl pump, spinal stimulator), diabetes -- presents with acute onset of worsening shortness of breath starting yesterday. Patient has had associated epigastric pain. Shortness of breath became severe this morning. Patient becomes recurrently short of breath with any kind of exercise. He does not have any pain higher up in his chest. He does not have any lower abdominal pain. Patient has some baseline lower extremity swelling which he thinks may be slightly worse. He has a raw feeling in his left ankle which is slightly red, however wife states that this is chronic. Patient states that he woke up 2-3 times last night and needed to sit up to help his breathing. Patient denies fevers or significant cough. No URI symptoms. No known sick contacts. No history of blood clots, recent surgery, travels or mobilizations. Patient is a remote smoker. Onset of symptoms acute. Course is worsening.      Past Medical History:  Diagnosis Date  . Allergy   . Anemia   . Arthritis   . Borderline hypertension   . Clotting disorder (HCC)    bleeds easily-no dx  . COPD (chronic obstructive pulmonary disease) ([place])    denies  . Depression    hx of   . Diabetes mellitus   . Diverticulosis of colon   . Full dentures   . GERD (gastroesophageal reflux disease)   . Hx of colonic polyps   . Hyperlipidemia   . Hypertension   . Lipoma   . Low back pain   . Mental disorder   . Obesity   . Pneumonia    hx  . RSD (reflex sympathetic dystrophy)   . Venous insufficiency     Patient Active Problem List   Diagnosis Date Noted  . Medicare annual wellness visit, subsequent 03/10/2015  . BMI 32.0-32.9,adult 03/10/2015    . RSD (reflex sympathetic dystrophy) 04/07/2014  . Obesity 04/07/2014  . Spinal cord stimulator status 03/31/2014  . Chronic pain associated with significant psychosocial dysfunction 10/24/2013  . Presence of other specified functional implants 10/24/2013  . Vitamin D deficiency 09/18/2013  . Medication management 09/18/2013  . T2_NIDDM 11/02/2012  . Anemia 11/02/2012  . 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 Spinal stenosis, multilevel 05/17/2012  . Cervical pain 05/17/2012  . DJD (degenerative joint disease) 04/24/2012  . Essential hypertension 03/20/2010  . COPD (chronic obstructive pulmonary disease) ([place]) 03/09/2009  . Venous (peripheral) insufficiency 09/12/2008  . Diverticulosis of large intestine 09/12/2008  . COLONIC POLYPS 02/22/2007  . Mixed hyperlipidemia 02/22/2007  . LOW BACK PAIN SYNDROME 02/22/2007    Past Surgical History:  Procedure Laterality Date  . BACK SURGERY  2005,2007  . C3-4 anterior cervical discectomy and fusion with plating at c3-4  05/2006   Dr. Arnoldo Morale  . COLONOSCOPY    . COLONOSCOPY W/ POLYPECTOMY    . excision of lipoma from right olecranon area  11/2000   Dr. Rise [name]  . KNEE ARTHROSCOPY     right knee  . KNEE ARTHROSCOPY Right 06/06/2014   Procedure: ARTHROSCOPY RIGHT KNEE WITH REMOVAL OF FIBROUS BANDS;  Surgeon: Kerin Salen, MD;  Location: [facility];  Service: Orthopedics;  Laterality:  Right;  [name] LUMBAR LAMINECTOMY/DECOMPRESSION MICRODISCECTOMY N/A 01/08/2014   Procedure: L4-S1 Decompression with removal and reimplantation of spinal cord stimulator battery ;  Surgeon: Melina Schools, MD;  Location: [facility];  Service: Orthopedics;  Laterality: N/A;  . microdiscectomy and decompression  05/2002   Dr. Tonita Cong  . morphine pump  2009   due to reaction to morphine/changed to fentanyl  . PAIN PUMP IMPLANTATION     with fentanyl  . spinal cord stimulator implanted     for pain per Dr. Maryruth Eve  . subcut pain pump implanted     . TONSILLECTOMY    . TOTAL KNEE ARTHROPLASTY  02/29/2012   Procedure: TOTAL KNEE ARTHROPLASTY;  Surgeon: Kerin Salen, MD;  Location: [facility];  Service: Orthopedics;  Laterality: Right;       Home Medications    Prior to Admission medications   Medication Sig Start Date End Date Taking? Authorizing Provider  atorvastatin (LIPITOR) 80 MG tablet Take 1/2 to 1 tablet daily for cholesterol as directed 03/29/16 09/27/16  Unk Pinto, MD  celecoxib (CELEBREX) 200 MG capsule Take 200 mg by mouth 2 (two) times daily. 01/20/14   Historical Provider, MD  Cholecalciferol (VITAMIN D-3) 5000 UNITS TABS Take 5,000 Units by mouth 2 (two) times daily.    Historical Provider, MD  cloNIDine (CATAPRES) 0.1 MG tablet Take 0.1 mg by mouth 2 (two) times daily.    Historical Provider, MD  cyclobenzaprine (FLEXERIL) 10 MG tablet Take 10 mg by mouth 3 (three) times daily as needed for muscle spasms. Muscle spasm. 03/02/11   Historical Provider, MD  docusate sodium 100 MG CAPS Take 100 mg by mouth 2 (two) times daily. 04/09/14   Shanker Kristeen Mans, MD  DULoxetine (CYMBALTA) 60 MG capsule Take 60 mg by mouth daily.  10/09/12   Historical Provider, MD  folic acid (FOLVITE) 1 MG tablet Take 1 mg by mouth daily. 08/13/14   Historical Provider, MD  glimepiride (AMARYL) 4 MG tablet TAKE 1 TABLET TWICE DAILY WITH MEALS. DO NOT TAKE IF YOU DO NOT EAT. 12/26/15   Unk Pinto, MD  lidocaine (LIDODERM) 5 % Place 1 patch onto the skin daily. 04/09/14   Shanker Kristeen Mans, MD  LYRICA 150 MG capsule Take 150 mg by mouth 2 (two) times daily.  02/21/11   Historical Provider, MD  metFORMIN (GLUCOPHAGE) 500 MG tablet TAKE TWO TABLETS BY MOUTH TWICE DAILY 02/24/16   Vicie Mutters, PA-C  methotrexate (RHEUMATREX) 2.5 MG tablet  08/13/14   Historical Provider, MD  metoCLOPramide (REGLAN) 10 MG tablet Take 1 tablet (10 mg total) by mouth 3 (three) times daily before meals. 03/17/16   Courtney Forcucci, PA-C  pantoprazole (PROTONIX) 40 MG  tablet Take 1 tablet (40 mg total) by mouth 2 (two) times daily before a meal. 03/30/16 03/30/17  Irene Shipper, MD  PRESCRIPTION MEDICATION 0.5 mcg/mL by Pump Prime route continuous. Fentanyl 0.05mg /ml solution 5,000 mcg. Patient will bring dosage instructions with him. Daily dose 0.84mg .    Historical Provider, MD  ranitidine (ZANTAC) 300 MG tablet Take 1 tablet (300 mg total) by mouth at bedtime. 02/22/16 02/21/17  Vicie Mutters, PA-C  sucralfate (CARAFATE) 1 g tablet Take 1 tablet (1 g total) by mouth 4 (four) times daily. 03/17/16 03/17/17  Courtney Forcucci, PA-C  tamsulosin (FLOMAX) 0.4 MG CAPS capsule Take 1 capsule (0.4 mg total) by mouth daily after supper. 08/20/15   Courtney Forcucci, PA-C  traMADol (ULTRAM) 50 MG tablet Take 1 tablet (50 mg total)  by mouth every 6 (six) hours as needed for moderate pain. pain 04/09/14   Jonetta Osgood, MD  triamcinolone cream (KENALOG) 0.1 % Apply 1 application topically 4 (four) times daily. 09/08/15   Unk Pinto, MD    Family History Family History  Problem Relation Age of Onset  . Cancer Father   . Colon cancer Neg Hx   . Esophageal cancer Neg Hx   . Stomach cancer Neg Hx   . Rectal cancer Neg Hx     Social History Social History  Substance Use Topics  . Smoking status: Former Smoker    Packs/day: 3.00    Years: 40.00    Types: Cigarettes    Quit date: 05/25/2002  . Smokeless tobacco: Never Used  . Alcohol use No     Allergies   Codeine and Morphine and related   Review of Systems Review of Systems  Constitutional: Negative for diaphoresis and fever.  Eyes: Negative for redness.  Respiratory: Positive for shortness of breath. Negative for cough and wheezing.   Cardiovascular: Positive for leg swelling. Negative for chest pain and palpitations.       + orthopnea  Gastrointestinal: Positive for abdominal pain. Negative for nausea and vomiting.  Genitourinary: Negative for dysuria.  Musculoskeletal: Negative for back pain  and neck pain.  Skin: Negative for rash.  Neurological: Negative for syncope and light-headedness.  Psychiatric/Behavioral: The patient is not nervous/anxious.      Physical Exam Updated Vital Signs BP 167/96   Pulse (!) 126   Temp 98.7 F (37.1 C) (Oral)   Resp (!) 29   SpO2 97%   Physical Exam  Constitutional: He appears well-developed and well-nourished.  HENT:  Head: Normocephalic and atraumatic.  Mouth/Throat: Oropharynx is clear and moist and mucous membranes are normal. Mucous membranes are not dry.  Eyes: Conjunctivae are normal.  Neck: Trachea normal and normal range of motion. Neck supple. Normal carotid pulses and no JVD present. No muscular tenderness present. Carotid bruit is not present. No tracheal deviation present.  Cardiovascular: Normal rate, regular rhythm, S1 normal, S2 normal, normal heart sounds and intact distal pulses.  Exam reveals no distant heart sounds and no decreased pulses.   No murmur heard. Pulmonary/Chest: Effort normal. No respiratory distress. He has no wheezes. He has rales (Bases, right greater than left). He exhibits no tenderness.  Abdominal: Soft. Normal aorta and bowel sounds are normal. He exhibits no pulsatile midline mass. There is tenderness (Epigastrium and right upper quadrant) in the right upper quadrant and epigastric area. There is no rebound and no guarding.  Musculoskeletal: He exhibits edema (1-2+ bilateral lower extremity edema to mid ankles.) and tenderness (Mild tenderness of the right ankle and distal calf.).  Mild erythema of left ankle, patient states is chronic.  Neurological: He is alert.  Skin: Skin is warm and dry. He is not diaphoretic. No cyanosis. No pallor.  Psychiatric: He has a normal mood and affect.  Nursing note and vitals reviewed.    ED Treatments / Results  Labs (all labs ordered are listed, but only abnormal results are displayed) Labs Reviewed  BASIC METABOLIC PANEL - Abnormal; Notable for the  following:       Result Value   Glucose, Bld 169 (*)    BUN 5 (*)    All other components within normal limits  CBC - Abnormal; Notable for the following:    RBC 4.17 (*)    Hemoglobin 12.3 (*)    HCT 36.3 (*)  All other components within normal limits  BRAIN NATRIURETIC PEPTIDE - Abnormal; Notable for the following:    B Natriuretic Peptide 155.7 (*)    All other components within normal limits  HEPATIC FUNCTION PANEL - Abnormal; Notable for the following:    ALT 12 (*)    All other components within normal limits  LIPASE, BLOOD  I-STAT TROPOININ, ED    EKG  EKG Interpretation  Date/Time:  Friday April 22 2016 13:09:32 EST Ventricular Rate:  119 PR Interval:    QRS Duration: 152 QT Interval:  338 QTC Calculation: 475 R Axis:   34 Text Interpretation:  Sinus tachycardia Left bundle branch block Abnormal ECG Since last EKG, no significant change Tachycardia persists No Sgarbossa criteria Confirmed by Ellender Hose MD, Lysbeth Galas [phone]) on 04/22/2016 1:09:48 PM Also confirmed by Ellender Hose MD, CAMERON [phone]), editor [place] CT, Leda Gauze [phone])  on 04/22/2016 1:26:11 PM       Radiology Dg Chest 2 View  Result Date: 04/22/2016 CLINICAL DATA:  Shortness of breath. EXAM: CHEST  2 VIEW COMPARISON:  02/14/2016 . FINDINGS: Cardiomegaly with diffuse bilateral pulmonary interstitial prominence and bilateral pleural effusions consistent congestive heart failure. Pneumonitis cannot be excluded . Findings have progressed from prior exam. Neurostimulator leads in stable position. IMPRESSION: Cardiomegaly with diffuse bilateral from interstitial prominence and bilateral pleural effusions consistent congestive heart failure. Pneumonitis cannot be excluded. Findings have progressed from prior exam. Electronically Signed   By: Marcello Moores  Register   On: 04/22/2016 14:10    Procedures Procedures (including critical care time)  Medications Ordered in ED Medications  HYDROmorphone (DILAUDID) injection 0.5 mg  (0.5 mg Intravenous Given 04/22/16 1552)  ondansetron (ZOFRAN) injection 4 mg (4 mg Intravenous Given 04/22/16 1552)  iopamidol (ISOVUE-370) 76 % injection (100 mLs  Contrast Given 04/22/16 1526)  furosemide (LASIX) injection 40 mg (40 mg Intravenous Given 04/22/16 1554)     Initial Impression / Assessment and Plan / ED Course  I have reviewed the triage vital signs and the nursing notes.  Pertinent labs & imaging results that were available during my care of the patient were reviewed by me and considered in my medical decision making (see chart for details).  Clinical Course    Patient seen and examined. Work-up initiated. Medications ordered. X-ray reviewed. Patient does have some signs of congestive heart failure, however lab work and chest x-ray seems less severe than his clinical symptoms. Will need CT scan of the chest to rule out pulmonary embolism. Patient satting 93-94% on room air. I have placed patient on 2 L nasal cannula for comfort.   Vital signs reviewed and are as follows: BP 167/96   Pulse (!) 126   Temp 98.7 F (37.1 C) (Oral)   Resp (!) 29   SpO2 97%   3:57 PM CT shows pleural effusions and pulmonary edema. Will admit for CHF exacerbation. Lasix ordered. Troponin has not crossed over but was 0.01.   4:30 PM Spoke with Dr. Algis Liming who will admit.   Final Clinical Impressions(s) / ED Diagnoses   Final diagnoses:  Shortness of breath  Acute pulmonary edema (HCC)  Epigastric pain   Admit.   New Prescriptions New Prescriptions   No medications on file     Carlisle Cater, PA-C 04/22/16 [place], MD 04/26/16 [phone]

## 2016-04-23 ENCOUNTER — Observation Stay (HOSPITAL_BASED_OUTPATIENT_CLINIC_OR_DEPARTMENT_OTHER): Payer: Medicare Other

## 2016-04-23 DIAGNOSIS — I130 Hypertensive heart and chronic kidney disease with heart failure and stage 1 through stage 4 chronic kidney disease, or unspecified chronic kidney disease: Secondary | ICD-10-CM | POA: Diagnosis present

## 2016-04-23 DIAGNOSIS — I255 Ischemic cardiomyopathy: Secondary | ICD-10-CM | POA: Diagnosis not present

## 2016-04-23 DIAGNOSIS — R42 Dizziness and giddiness: Secondary | ICD-10-CM | POA: Diagnosis not present

## 2016-04-23 DIAGNOSIS — I509 Heart failure, unspecified: Secondary | ICD-10-CM | POA: Diagnosis not present

## 2016-04-23 DIAGNOSIS — Z87891 Personal history of nicotine dependence: Secondary | ICD-10-CM | POA: Diagnosis not present

## 2016-04-23 DIAGNOSIS — I2510 Atherosclerotic heart disease of native coronary artery without angina pectoris: Secondary | ICD-10-CM | POA: Diagnosis present

## 2016-04-23 DIAGNOSIS — R0789 Other chest pain: Secondary | ICD-10-CM

## 2016-04-23 DIAGNOSIS — J432 Centrilobular emphysema: Secondary | ICD-10-CM | POA: Diagnosis present

## 2016-04-23 DIAGNOSIS — K219 Gastro-esophageal reflux disease without esophagitis: Secondary | ICD-10-CM | POA: Diagnosis present

## 2016-04-23 DIAGNOSIS — G9050 Complex regional pain syndrome I, unspecified: Secondary | ICD-10-CM | POA: Diagnosis present

## 2016-04-23 DIAGNOSIS — I5043 Acute on chronic combined systolic (congestive) and diastolic (congestive) heart failure: Secondary | ICD-10-CM | POA: Diagnosis not present

## 2016-04-23 DIAGNOSIS — R55 Syncope and collapse: Secondary | ICD-10-CM | POA: Diagnosis not present

## 2016-04-23 DIAGNOSIS — R0600 Dyspnea, unspecified: Secondary | ICD-10-CM | POA: Diagnosis not present

## 2016-04-23 DIAGNOSIS — I429 Cardiomyopathy, unspecified: Secondary | ICD-10-CM | POA: Diagnosis present

## 2016-04-23 DIAGNOSIS — I447 Left bundle-branch block, unspecified: Secondary | ICD-10-CM | POA: Diagnosis present

## 2016-04-23 DIAGNOSIS — I5021 Acute systolic (congestive) heart failure: Secondary | ICD-10-CM | POA: Diagnosis present

## 2016-04-23 DIAGNOSIS — I10 Essential (primary) hypertension: Secondary | ICD-10-CM | POA: Diagnosis not present

## 2016-04-23 DIAGNOSIS — N189 Chronic kidney disease, unspecified: Secondary | ICD-10-CM | POA: Diagnosis present

## 2016-04-23 DIAGNOSIS — J810 Acute pulmonary edema: Secondary | ICD-10-CM | POA: Diagnosis not present

## 2016-04-23 DIAGNOSIS — Z96651 Presence of right artificial knee joint: Secondary | ICD-10-CM | POA: Diagnosis present

## 2016-04-23 DIAGNOSIS — F329 Major depressive disorder, single episode, unspecified: Secondary | ICD-10-CM | POA: Diagnosis present

## 2016-04-23 DIAGNOSIS — I5023 Acute on chronic systolic (congestive) heart failure: Secondary | ICD-10-CM | POA: Diagnosis not present

## 2016-04-23 DIAGNOSIS — R0602 Shortness of breath: Secondary | ICD-10-CM | POA: Diagnosis present

## 2016-04-23 DIAGNOSIS — I872 Venous insufficiency (chronic) (peripheral): Secondary | ICD-10-CM | POA: Diagnosis present

## 2016-04-23 DIAGNOSIS — I272 Pulmonary hypertension, unspecified: Secondary | ICD-10-CM | POA: Diagnosis present

## 2016-04-23 DIAGNOSIS — E782 Mixed hyperlipidemia: Secondary | ICD-10-CM | POA: Diagnosis present

## 2016-04-23 DIAGNOSIS — E1165 Type 2 diabetes mellitus with hyperglycemia: Secondary | ICD-10-CM | POA: Diagnosis present

## 2016-04-23 DIAGNOSIS — E1122 Type 2 diabetes mellitus with diabetic chronic kidney disease: Secondary | ICD-10-CM | POA: Diagnosis present

## 2016-04-23 DIAGNOSIS — I959 Hypotension, unspecified: Secondary | ICD-10-CM | POA: Diagnosis not present

## 2016-04-23 DIAGNOSIS — G894 Chronic pain syndrome: Secondary | ICD-10-CM | POA: Diagnosis present

## 2016-04-23 DIAGNOSIS — Z7984 Long term (current) use of oral hypoglycemic drugs: Secondary | ICD-10-CM | POA: Diagnosis not present

## 2016-04-23 DIAGNOSIS — E876 Hypokalemia: Secondary | ICD-10-CM | POA: Diagnosis not present

## 2016-04-23 LAB — BASIC METABOLIC PANEL
Anion gap: 10 (ref 5–15)
BUN: 7 mg/dL (ref 6–20)
CALCIUM: 8.8 mg/dL — AB (ref 8.9–10.3)
CHLORIDE: 100 mmol/L — AB (ref 101–111)
CO2: 29 mmol/L (ref 22–32)
Creatinine, Ser: 0.73 mg/dL (ref 0.61–1.24)
GFR calc non Af Amer: >60 mL/min (ref >60)
GLUCOSE: 154 mg/dL — AB (ref 65–99)
POTASSIUM: 3.7 mmol/L (ref 3.5–5.1)
SODIUM: 139 mmol/L (ref 135–145)

## 2016-04-23 LAB — ECHOCARDIOGRAM COMPLETE
HEIGHTINCHES: 67.5 in
WEIGHTICAEL: 3224 [oz_av]

## 2016-04-23 LAB — TROPONIN I
Troponin I: <0.03 ng/mL (ref <0.03)
Troponin I: <0.03 ng/mL (ref <0.03)

## 2016-04-23 LAB — GLUCOSE, CAPILLARY
GLUCOSE-CAPILLARY: 137 mg/dL — AB (ref 65–99)
GLUCOSE-CAPILLARY: 151 mg/dL — AB (ref 65–99)
GLUCOSE-CAPILLARY: 176 mg/dL — AB (ref 65–99)
Glucose-Capillary: 205 mg/dL — ABNORMAL HIGH (ref 65–99)

## 2016-04-23 LAB — HEMOGLOBIN A1C
HEMOGLOBIN A1C: 9.5 % — AB (ref 4.8–5.6)
Mean Plasma Glucose: 226 mg/dL

## 2016-04-23 MED ORDER — FUROSEMIDE 10 MG/ML IJ SOLN
40.0000 mg | Freq: Every day | INTRAMUSCULAR | Status: DC
  Administered 2016-04-24: 40 mg via INTRAVENOUS
  Filled 2016-04-23: qty 4

## 2016-04-23 MED ORDER — CLONIDINE HCL 0.1 MG PO TABS
0.1000 mg | ORAL_TABLET | Freq: Every day | ORAL | Status: DC
  Administered 2016-04-24 – 2016-04-26 (×3): 0.1 mg via ORAL
  Filled 2016-04-23 (×3): qty 1

## 2016-04-23 MED ORDER — LISINOPRIL 5 MG PO TABS
5.0000 mg | ORAL_TABLET | Freq: Every day | ORAL | Status: DC
  Administered 2016-04-23 – 2016-04-24 (×2): 5 mg via ORAL
  Filled 2016-04-23 (×2): qty 1

## 2016-04-23 NOTE — Consult Note (Signed)
Primary cardiologist: N/A Consulting cardiologist: Dr Carlyle Dolly Requesting physician: Dr Algis Liming Indication: dyspnea  Clinical Summary James Parsons is a 71 y.o.male history of DM2, HTN, COPD, GERD, HL, chronic back pain, chronic LBBB admitted with dyspnea and chest pain.   He reports feeling well up until 2 days ago. Fairly sudden onset of severe dyspnea. He initially limited his activities at home for about a day. Reports later taking his trash cans to the curb with severe DOE. Had to stop several times due to SOB. This is something he usually tolerates without issues. Worsening symptoms later on at his brothers house, difficultly walking just short distances. Yesterday had severe pressure like epigastric pain, "like elephant on my chest". 7/10 in severity, lasted about 3-4 hours. Reports severe LE edema over the last few days.    K 3.9 Cr 0.65 Hgb 12.3, Plt 232 lipase 25 bnp 155 TSH 2.37 Hgb A1C 9.5  Trop 0.03-->neg-->neg CXR cardiomegaly with pulm edema, cannot exlude pneumonitis CT PE: no PE. +pulmonary edema, moderate CAD.  Abd [place]: no acute process EKG sinus tach with LBBB Echo LVEF 40-45%, moderate LVH, diffuse hypokinesis, abnormal diastolic function, mild AS, mild MR.     Allergies  Allergen Reactions  . Codeine Itching  . Morphine And Related Other (See Comments)    hallucinations    Medications Scheduled Medications: . aspirin EC  81 mg Oral Daily  . atorvastatin  40 mg Oral q1800  . cholecalciferol  5,000 Units Oral BID  . cloNIDine  0.1 mg Oral BID  . docusate sodium  100 mg Oral BID  . DULoxetine  60 mg Oral Daily  . enoxaparin (LOVENOX) injection  40 mg Subcutaneous Q24H  . famotidine  40 mg Oral QHS  . folic acid  1 mg Oral Daily  . furosemide  40 mg Intravenous BID  . insulin aspart  0-9 Units Subcutaneous TID WC  . mouth rinse  15 mL Mouth Rinse BID  . metoCLOPramide  10 mg Oral TID AC  . pantoprazole  40 mg Oral BID AC  . pregabalin  150 mg Oral  BID  . sodium chloride flush  3 mL Intravenous Q12H  . sucralfate  1 g Oral QID  . tamsulosin  0.4 mg Oral QPC supper     Infusions:   PRN Medications:  acetaminophen **OR** acetaminophen, albuterol, cyclobenzaprine, hydrALAZINE, traMADol   Past Medical History:  Diagnosis Date  . Allergy   . Anemia   . Arthritis   . Borderline hypertension   . Clotting disorder (HCC)    bleeds easily-no dx  . COPD (chronic obstructive pulmonary disease) ([place])    denies  . Depression    hx of   . Diabetes mellitus   . Diverticulosis of colon   . Full dentures   . GERD (gastroesophageal reflux disease)   . Hx of colonic polyps   . Hyperlipidemia   . Hypertension   . Lipoma   . Low back pain   . Mental disorder   . Obesity   . Pneumonia    hx  . RSD (reflex sympathetic dystrophy)   . Venous insufficiency     Past Surgical History:  Procedure Laterality Date  . BACK SURGERY  2005,2007  . C3-4 anterior cervical discectomy and fusion with plating at c3-4  05/2006   Dr. Arnoldo Morale  . COLONOSCOPY    . COLONOSCOPY W/ POLYPECTOMY    . excision of lipoma from right olecranon area  11/2000  Dr. Rise [name]  . KNEE ARTHROSCOPY     right knee  . KNEE ARTHROSCOPY Right 06/06/2014   Procedure: ARTHROSCOPY RIGHT KNEE WITH REMOVAL OF FIBROUS BANDS;  Surgeon: Kerin Salen, MD;  Location: [facility];  Service: Orthopedics;  Laterality: Right;  . LUMBAR LAMINECTOMY/DECOMPRESSION MICRODISCECTOMY N/A 01/08/2014   Procedure: L4-S1 Decompression with removal and reimplantation of spinal cord stimulator battery ;  Surgeon: Melina Schools, MD;  Location: [facility];  Service: Orthopedics;  Laterality: N/A;  . microdiscectomy and decompression  05/2002   Dr. Tonita Cong  . morphine pump  2009   due to reaction to morphine/changed to fentanyl  . PAIN PUMP IMPLANTATION     with fentanyl  . spinal cord stimulator implanted     for pain per Dr. Maryruth Eve  . subcut pain pump implanted    . TONSILLECTOMY      . TOTAL KNEE ARTHROPLASTY  02/29/2012   Procedure: TOTAL KNEE ARTHROPLASTY;  Surgeon: Kerin Salen, MD;  Location: [facility];  Service: Orthopedics;  Laterality: Right;    Family History  Problem Relation Age of Onset  . Cancer Father   . Colon cancer Neg Hx   . Esophageal cancer Neg Hx   . Stomach cancer Neg Hx   . Rectal cancer Neg Hx     Social History James Parsons reports that he quit smoking about 13 years ago. His smoking use included Cigarettes. He has a 120.00 pack-year smoking history. He has never used smokeless tobacco. James Parsons reports that he does not drink alcohol.  Review of Systems CONSTITUTIONAL: No weight loss, fever, chills, weakness or fatigue.  HEENT: Eyes: No visual loss, blurred vision, double vision or yellow sclerae. No hearing loss, sneezing, congestion, runny nose or sore throat.  SKIN: No rash or itching.  CARDIOVASCULAR: per HPI  RESPIRATORY: per HPI GASTROINTESTINAL: No anorexia, nausea, vomiting or diarrhea. No abdominal pain or blood.  GENITOURINARY: no polyuria, no dysuria NEUROLOGICAL: No headache, dizziness, syncope, paralysis, ataxia, numbness or tingling in the extremities. No change in bowel or bladder control.  MUSCULOSKELETAL: No muscle, back pain, joint pain or stiffness.  HEMATOLOGIC: No anemia, bleeding or bruising.  LYMPHATICS: No enlarged nodes. No history of splenectomy.  PSYCHIATRIC: No history of depression or anxiety.      Physical Examination Blood pressure 129/70, pulse 100, temperature 98 F (36.7 C), temperature source Oral, resp. rate 20, height 5' 7.5" (1.715 m), weight 201 lb 8 oz (91.4 kg), SpO2 93 %.  Intake/Output Summary (Last 24 hours) at 04/23/16 1421 Last data filed at 04/23/16 1048  Gross per 24 hour  Intake              530 ml  Output             3000 ml  Net            -2470 ml   General: NAD  HEENT: sclera clear, throat clear  Cardiovascular: RRR, no m/rg, no jvd  Respiratory: CTAB  GI: abdomen soft,  NT, ND  MSK: no LE edema  Neuro: no focal deficits  Psych: appropriate affect   Lab Results  Basic Metabolic Panel:  Recent Labs Lab 04/22/16 1322 04/23/16 0915  NA 140 139  K 3.9 3.7  CL 106 100*  CO2 24 29  GLUCOSE 169* 154*  BUN 5* 7  CREATININE 0.65 0.73  CALCIUM 9.5 8.8*    Liver Function Tests:  Recent Labs Lab 04/22/16 1322  AST 24  ALT 12*  ALKPHOS 75  BILITOT 0.7  PROT 7.1  ALBUMIN 3.6    CBC:  Recent Labs Lab 04/22/16 1322  WBC 5.8  HGB 12.3*  HCT 36.3*  MCV 87.1  PLT 232    Cardiac Enzymes:  Recent Labs Lab 04/22/16 1950 04/23/16 0218 04/23/16 0915  TROPONINI 0.03* <0.03 <0.03    BNP: Invalid input(s): POCBNP    Impression/Recommendations 1. Chest pain/DOE/Acute systolic HF - severe symptoms with onset about 2 days ago - echo with LVEF 40-45%. EKG chronic LBBB. He has several CAD risk factors including HTN, HL, poorly controlled DM2 with HgbA1c of 9, former tobacco, brother with history of CABG around age 60 - given the constellation of symptoms, risk factors, decreased LVEF, and LBBB on EKG high concern for CAD as etiology of his symptoms and his cardiomyopathy. We will plan for LHC/RHC on Monday - negative 2.4 liters yesterday, improving symptoms. He is on lasix 40mg  IV bid, will change to once daily given his significant diureisis with same dose yesterday.  - medical therapy with ASA 81, atorva 40. In setting of systolic dysfunction and DM2 start lisinopril 5mg  daily.  - if recurrence of chest pain would start heparin at that time.   2. HTN - wean clonidine in favor of more beneficial CHF medications. Change clonidine to 0.1mg  daily    Carlyle Dolly, M.D.

## 2016-04-23 NOTE — Progress Notes (Signed)
SATURATION QUALIFICATIONS: (This note is used to comply with regulatory documentation for home oxygen)  Patient Saturations on Room Air at Rest = 95%  Patient Saturations on Room Air while Ambulating = 95%  Pt walked in a hallway without any SOB and distress, Pt's oxygen was maintained at 95%  Palma Holter, Therapist, sports

## 2016-04-23 NOTE — ED Notes (Signed)
Wasted 1.5 mg Dilaudid with [place]

## 2016-04-23 NOTE — Progress Notes (Signed)
PROGRESS NOTE  James Parsons  [id number] DOB: 25-Jul-1945  DOA: 04/22/2016 PCP: Alesia Richards, MD   Brief Narrative:  71 y.o. male , married, lives with spouse, ambulates with the help of a cane, physically not very active, PMH of DM 2, HTN, COPD not on home oxygen, former heavy smoker quit 15 years ago, depression, GERD, status post recent esophageal stricture dilatation recently, HLD, chronic back pain status post spinal stimulator and fentanyl pump management and outpatient pain management, presented to [facility] ED with complaints of dyspnea and atypical chest pain. Admitted for acute CHF, unknown type initially due to no echo. Clinically improved. Now cardiomyopathy on echo. Cardiology consulted and suspect his presentation is due to CAD and plan continued medical optimization and left and right heart cath on 04/26/15.  Assessment & Plan:   Principal Problem:   Acute exacerbation of CHF (congestive heart failure) (HCC) Active Problems:   Mixed hyperlipidemia   Essential hypertension   Venous (peripheral) insufficiency   COPD (chronic obstructive pulmonary disease) (HCC)   T2_NIDDM   Chronic pain associated with significant psychosocial dysfunction   Spinal cord stimulator status   RSD (reflex sympathetic dystrophy)   Atypical chest pain   Acute systolic CHF (congestive heart failure) ([place])   1. Acute systolic CHF: Admitted to telemetry. Improved after a dose of IV Lasix 40 mg 1 in ED. Continue Lasix 40 mg IV twice a day. Strict intake output, daily weights. -2.5 L since admission. 2-D echo: LVEF 40-45 percent with diffuse hypokinesis and grade 1 diastolic dysfunction. Cardiology was consulted and input appreciated. Have reduced Lasix to 40 mg IV daily and added lisinopril 5 MG daily. Clinically improved. They suspect his presentation is due to CAD and plan medical optimization followed by left and right heart On 04/25/16. 2. Cardiomyopathy: Management as indicated  above. 3. 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 Continue aspirin, atorvastatin. 4. Chest pain, atypical: EKG without acute changes. Troponin essentially negative. Aspirin 81 MG daily. Management as indicated above. 5. COPD: Patient's dyspnea may be a combination of decompensated CHF and underlying COPD as evidenced on CT chest. No PE noted. No clinical bronchospasm. When necessary oxygen and bronchodilators. No hypoxia with ambulation. Symptomatically improved. 6. Essential hypertension: Started lisinopril. Tapering down clonidine to discontinue. When necessary IV hydralazine. 7. Hyperlipidemia: Continue statins. 8. Type II DM: Hold oral hypoglycemics. A1c: 9.5 suggesting very poor outpatient control. May need to consider insulin's at discharge. SSI. 9. Chronic pain: Continue home pain regimen. As discussed with pharmacy verified that patient's fentanyl pump is not due for refill for a couple of weeks. 10. GERD/status post recent esophageal dilatation: Continue home regimen of PPI, H2 blockers and sucralfate. Unsure of his atypical chest pain is GI in origin. Lipase normal. Abdominal ultrasound without acute findings.   DVT prophylaxis: Lovenox Code Status: Full Family Communication: Discussed with spouse in detail. Disposition Plan: Patient has acute systolic CHF requiring IV diuresis. 2-D echo shows new cardiomyopathy. His presentation is suspicious for coronary artery disease and cardiology plans left and right heart cath on 1/15 after medical optimization. He meets criteria for inpatient admission and will certainly need to midnight hospital stay since admission. DC home when medically stable.   Consultants:   Cardiology  Procedures:   2-D echo 04/23/16: Study Conclusions  - Left ventricle: The cavity size was normal. There was moderate   concentric hypertrophy. Systolic function was mildly to   moderately reduced. The estimated ejection fraction was in the  range of 40% to 45%. Diffuse hypokinesis. Doppler parameters are   consistent with abnormal left ventricular relaxation (grade 1   diastolic dysfunction). Doppler parameters are consistent with   elevated ventricular end-diastolic filling pressure. - Ventricular septum: Septal motion showed paradox. - Aortic valve: Trileaflet; mildly thickened, mildly calcified   leaflets. There was mild stenosis. Mean gradient (S): 8 mm Hg.   Peak gradient (S): 19 mm Hg. Valve area (VTI): 2.49 cm^2. Valve   area (Vmax): 2.19 cm^2. Valve area (Vmean): 2.31 cm^2. - Mitral valve: There was mild regurgitation. - Left atrium: The atrium was mildly dilated. - Right ventricle: Systolic function was normal. - Pulmonary arteries: Systolic pressure was within the normal   range.  Impressions:  - There is moderate concentric LVH with diffuse hypokinesis and   paradoxical septal motion consistent with LBBB. LVEF 40-45%.   Filling pressures are elevated.  Antimicrobials:   None    Subjective: Feels much better. No chest pain. Denies dyspnea. Ambulated with nursing and room air without complaints. Urinated well since ED.  Objective:  Vitals:   04/22/16 2138 04/23/16 0043 04/23/16 0416 04/23/16 1214  BP: 132/76 102/71 106/60 129/70  Pulse: 100 (!) 108 93 100  Resp:  20 18 20   Temp:  98.8 F (37.1 C) 98.6 F (37 C) 98 F (36.7 C)  TempSrc:  Oral Oral Oral  SpO2:  97% 98% 93%  Weight:   91.4 kg (201 lb 8 oz)   Height:        Intake/Output Summary (Last 24 hours) at 04/23/16 1543 Last data filed at 04/23/16 1048  Gross per 24 hour  Intake              530 ml  Output             3000 ml  Net            -2470 ml   Filed Weights   04/22/16 1832 04/23/16 0416  Weight: 91.5 kg (201 lb 11.2 oz) 91.4 kg (201 lb 8 oz)    Examination:  General exam: Pleasant elderly male sitting up comfortably in bed this morning. Respiratory system: Occasional basal crackles but otherwise clear to auscultation.  Respiratory effort normal. Cardiovascular system: S1 & S2 heard, RRR. No JVD, murmurs, rubs, gallops or clicks. No pedal edema. Telemetry: Sinus rhythm, BBB morphology. Gastrointestinal system: Abdomen is nondistended, soft and nontender. No organomegaly or masses felt. Normal bowel sounds heard. Central nervous system: Alert and oriented. No focal neurological deficits. Extremities: Symmetric 5 x 5 power. Skin: No rashes, lesions or ulcers Psychiatry: Judgement and insight appear normal. Mood & affect appropriate.     Data Reviewed: I have personally reviewed following labs and imaging studies  CBC:  Recent Labs Lab 04/22/16 1322  WBC 5.8  HGB 12.3*  HCT 36.3*  MCV 87.1  PLT [id number]   Basic Metabolic Panel:  Recent Labs Lab 04/22/16 1322 04/23/16 0915  NA 140 139  K 3.9 3.7  CL 106 100*  CO2 24 29  GLUCOSE 169* 154*  BUN 5* 7  CREATININE 0.65 0.73  CALCIUM 9.5 8.8*   GFR: Estimated Creatinine Clearance: 93.5 mL/min (by C-G formula based on SCr of 0.73 mg/dL). Liver Function Tests:  Recent Labs Lab 04/22/16 1322  AST 24  ALT 12*  ALKPHOS 75  BILITOT 0.7  PROT 7.1  ALBUMIN 3.6    Recent Labs Lab 04/22/16 1322  LIPASE 25   No results for input(s): AMMONIA in  the last 168 hours. Coagulation Profile: No results for input(s): INR, PROTIME in the last 168 hours. Cardiac Enzymes:  Recent Labs Lab 04/22/16 1950 04/23/16 0218 04/23/16 0915  TROPONINI 0.03* <0.03 <0.03   BNP (last 3 results) No results for input(s): PROBNP in the last 8760 hours. HbA1C:  Recent Labs  04/22/16 1950  HGBA1C 9.5*   CBG:  Recent Labs Lab 04/22/16 2031 04/23/16 0612 04/23/16 1119  GLUCAP 140* 137* 205*   Lipid Profile: No results for input(s): CHOL, HDL, LDLCALC, TRIG, CHOLHDL, LDLDIRECT in the last 72 hours. Thyroid Function Tests:  Recent Labs  04/22/16 1950  TSH 2.374   Anemia Panel: No results for input(s): VITAMINB12, FOLATE, FERRITIN, TIBC, IRON,  RETICCTPCT in the last 72 hours.  Sepsis Labs:  Recent Labs Lab 04/22/16 1322  WBC 5.8    No results found for this or any previous visit (from the past 240 hour(s)).       Radiology Studies: Dg Chest 2 View  Result Date: 04/22/2016 CLINICAL DATA:  Shortness of breath. EXAM: CHEST  2 VIEW COMPARISON:  02/14/2016 . FINDINGS: Cardiomegaly with diffuse bilateral pulmonary interstitial prominence and bilateral pleural effusions consistent congestive heart failure. Pneumonitis cannot be excluded . Findings have progressed from prior exam. Neurostimulator leads in stable position. IMPRESSION: Cardiomegaly with diffuse bilateral from interstitial prominence and bilateral pleural effusions consistent congestive heart failure. Pneumonitis cannot be excluded. Findings have progressed from prior exam. Electronically Signed   By: [place]   On: 04/22/2016 14:10   Ct Angio Chest Pe W Or Wo Contrast  Result Date: 04/22/2016 CLINICAL DATA:  71 y/o  M; shortness of breath and tachycardia. EXAM: CT ANGIOGRAPHY CHEST WITH CONTRAST TECHNIQUE: Multidetector CT imaging of the chest was performed using the standard protocol during bolus administration of intravenous contrast. Multiplanar CT image reconstructions and MIPs were obtained to evaluate the vascular anatomy. CONTRAST:  100 cc Isovue 370. COMPARISON:  04/09/2006 CT chest. FINDINGS: Cardiovascular: Satisfactory opacification of the pulmonary arteries to the segmental level. Respiratory motion artifact in the right greater than left lung bases. No evidence of pulmonary embolism. Mild cardiomegaly. Moderate coronary artery calcification. Normal caliber thoracic aorta with mild calcific atherosclerosis. Normal caliber main pulmonary artery. Mediastinum/Nodes: No enlarged mediastinal, hilar, or axillary lymph nodes. Thyroid gland, trachea, and esophagus demonstrate no significant findings. Lungs/Pleura: Clusters of nodules within the periphery of the  lung in the left upper lobe and right upper lobe measuring up to 4 mm. Stable 5.6 mm nodule in the right lung apex. Biapical moderate centrilobular pulmonary emphysema. Interlobular septal thickening in hazy opacity of lung parenchyma probably represents interstitial pulmonary edema. Small bilateral pleural effusions. Upper Abdomen: Nonspecific prominent subcentimeter gastrohepatic lymph nodes. Musculoskeletal: 2 spinal stimulator electrodes are noted within the lower and mid thoracic spine. Mild dextrocurvature of the thoracic spine. No acute osseous abnormality is evident. Review of the MIP images confirms the above findings. IMPRESSION: 1. No pulmonary embolus identified. Right greater than left lung base respiratory artifact. 2. Small bilateral pleural effusions and interstitial pulmonary edema. 3. Mild-to-moderate lung apex centrilobular emphysema. 4. Mild cardiomegaly.  Moderate coronary artery calcification. 5. Clusters of nodules within periphery of the lungs measuring up to 4 mm. No follow-up needed if patient is low-risk (and has no known or suspected primary neoplasm). Non-contrast chest CT can be considered in 12 months if patient is high-risk. This recommendation follows the consensus statement: Guidelines for Management of Incidental Pulmonary Nodules Detected on CT Images: From the [place]  2017; Radiology 2017; [id number]. Electronically Signed   By: Kristine Garbe M.D.   On: 04/22/2016 15:42   US Abdomen Complete  Result Date: 04/22/2016 CLINICAL DATA:  Epigastric pain. EXAM: ABDOMEN ULTRASOUND COMPLETE COMPARISON:  CT abdomen and pelvis 06/16/2015. FINDINGS: Gallbladder: No gallstones or wall thickening visualized. No sonographic Murphy sign noted by sonographer. Common bile duct: Diameter: 3.3 mm Liver: Normal echogenicity.  There may be borderline hepatomegaly. IVC: No abnormality visualized. Pancreas: Visualized portion unremarkable. Spleen: Size and appearance within  normal limits. Right Kidney: Length: 11.5 cm. Echogenicity within normal limits. No solid mass or hydronephrosis visualized. 1.0 cm cyst identified in the upper pole. Left Kidney: Length: 11.5 cm. Echogenicity within normal limits. No Solid mass or hydronephrosis visualized. 1.7 cm cyst upper pole. Abdominal aorta: Limited visualization, up to 2.2 mm cm in diameter. Other findings: Limited visualization of several structures due to body habitus and bowel gas. IMPRESSION: No acute findings.  No cause seen for epigastric pain. Electronically Signed   By: Staci Righter M.D.   On: 04/22/2016 18:00        Scheduled Meds: . aspirin EC  81 mg Oral Daily  . atorvastatin  40 mg Oral q1800  . cholecalciferol  5,000 Units Oral BID  . [START ON 04/24/2016] cloNIDine  0.1 mg Oral Daily  . docusate sodium  100 mg Oral BID  . DULoxetine  60 mg Oral Daily  . enoxaparin (LOVENOX) injection  40 mg Subcutaneous Q24H  . famotidine  40 mg Oral QHS  . folic acid  1 mg Oral Daily  . [START ON 04/24/2016] furosemide  40 mg Intravenous Daily  . insulin aspart  0-9 Units Subcutaneous TID WC  . lisinopril  5 mg Oral Daily  . mouth rinse  15 mL Mouth Rinse BID  . metoCLOPramide  10 mg Oral TID AC  . pantoprazole  40 mg Oral BID AC  . pregabalin  150 mg Oral BID  . sodium chloride flush  3 mL Intravenous Q12H  . sucralfate  1 g Oral QID  . tamsulosin  0.4 mg Oral QPC supper   Continuous Infusions:   LOS: 0 days     [facility], MD [facility] Pager [phone]  If 7PM-7AM, please contact night-coverage [email or web address] Password [facility] 04/23/2016, 3:43 PM

## 2016-04-23 NOTE — Progress Notes (Signed)
Patient with no complaints or concerns during 7pm - 7am shift.  Johnella Crumm, RN 

## 2016-04-24 DIAGNOSIS — E876 Hypokalemia: Secondary | ICD-10-CM

## 2016-04-24 LAB — GLUCOSE, CAPILLARY
Glucose-Capillary: 149 mg/dL — ABNORMAL HIGH (ref 65–99)
Glucose-Capillary: 213 mg/dL — ABNORMAL HIGH (ref 65–99)
Glucose-Capillary: 167 mg/dL — ABNORMAL HIGH (ref 65–99)
Glucose-Capillary: 162 mg/dL — ABNORMAL HIGH (ref 65–99)

## 2016-04-24 LAB — BASIC METABOLIC PANEL
ANION GAP: 11 (ref 5–15)
BUN: 9 mg/dL (ref 6–20)
CO2: 29 mmol/L (ref 22–32)
CREATININE: 0.87 mg/dL (ref 0.61–1.24)
Calcium: 8.8 mg/dL — ABNORMAL LOW (ref 8.9–10.3)
Chloride: 97 mmol/L — ABNORMAL LOW (ref 101–111)
GFR calc non Af Amer: >60 mL/min (ref >60)
Glucose, Bld: 140 mg/dL — ABNORMAL HIGH (ref 65–99)
Potassium: 3.2 mmol/L — ABNORMAL LOW (ref 3.5–5.1)
SODIUM: 137 mmol/L (ref 135–145)

## 2016-04-24 LAB — MAGNESIUM: MAGNESIUM: 1.6 mg/dL — AB (ref 1.7–2.4)

## 2016-04-24 MED ORDER — SODIUM CHLORIDE 0.9% FLUSH
3.0000 mL | Freq: Two times a day (BID) | INTRAVENOUS | Status: DC
  Administered 2016-04-24 – 2016-04-25 (×2): 3 mL via INTRAVENOUS

## 2016-04-24 MED ORDER — ASPIRIN 81 MG PO CHEW
81.0000 mg | CHEWABLE_TABLET | ORAL | Status: AC
  Administered 2016-04-25: 81 mg via ORAL
  Filled 2016-04-24: qty 1

## 2016-04-24 MED ORDER — SODIUM CHLORIDE 0.9 % IV SOLN
INTRAVENOUS | Status: DC
  Administered 2016-04-25: 10 mL/h via INTRAVENOUS

## 2016-04-24 MED ORDER — POTASSIUM CHLORIDE CRYS ER 20 MEQ PO TBCR
40.0000 meq | EXTENDED_RELEASE_TABLET | Freq: Once | ORAL | Status: AC
  Administered 2016-04-24: 40 meq via ORAL
  Filled 2016-04-24: qty 2

## 2016-04-24 MED ORDER — SODIUM CHLORIDE 0.9 % IV SOLN
250.0000 mL | INTRAVENOUS | Status: DC | PRN
Start: 2016-04-24 — End: 2016-04-25

## 2016-04-24 MED ORDER — METOPROLOL SUCCINATE ER 25 MG PO TB24: 12.5000 mg | ORAL_TABLET | Freq: Every day | ORAL | Status: DC

## 2016-04-24 MED ORDER — SODIUM CHLORIDE 0.9% FLUSH: 3.0000 mL | INTRAVENOUS | Status: DC | PRN

## 2016-04-24 MED ORDER — LISINOPRIL 2.5 MG PO TABS
2.5000 mg | ORAL_TABLET | Freq: Every day | ORAL | Status: DC
  Administered 2016-04-25 – 2016-04-26 (×2): 2.5 mg via ORAL
  Filled 2016-04-24 (×2): qty 1

## 2016-04-24 MED ORDER — POTASSIUM CHLORIDE CRYS ER 20 MEQ PO TBCR
40.0000 meq | EXTENDED_RELEASE_TABLET | Freq: Every day | ORAL | Status: DC
  Administered 2016-04-24 – 2016-04-26 (×3): 40 meq via ORAL
  Filled 2016-04-24 (×3): qty 2

## 2016-04-24 NOTE — Progress Notes (Signed)
Patient with no complaints or concerns during 7pm - 7am shift.  Naeem Quillin, RN 

## 2016-04-24 NOTE — Progress Notes (Signed)
PROGRESS NOTE  James Parsons  [id number] DOB: 06/14/1945  DOA: 04/22/2016 PCP: Alesia Richards, MD   Brief Narrative:  71 y.o. male , married, lives with spouse, ambulates with the help of a cane, physically not very active, PMH of DM 2, HTN, COPD not on home oxygen, former heavy smoker quit 15 years ago, depression, GERD, status post recent esophageal stricture dilatation recently, HLD, chronic back pain status post spinal stimulator and fentanyl pump management and outpatient pain management, presented to [facility] ED with complaints of dyspnea and atypical chest pain. Admitted for acute CHF, unknown type initially due to no echo. Clinically improved. Now cardiomyopathy on echo. Cardiology consulted and suspect his presentation is due to CAD and plan continued medical optimization and left and right heart cath on 04/26/15.  Assessment & Plan:   Principal Problem:   Acute exacerbation of CHF (congestive heart failure) (HCC) Active Problems:   Mixed hyperlipidemia   Essential hypertension   Venous (peripheral) insufficiency   COPD (chronic obstructive pulmonary disease) (HCC)   T2_NIDDM   Chronic pain associated with significant psychosocial dysfunction   Spinal cord stimulator status   RSD (reflex sympathetic dystrophy)   Atypical chest pain   Acute systolic CHF (congestive heart failure) ([place])   1. Acute systolic CHF: Admitted to telemetry. Improved after a dose of IV Lasix 40 mg 1 in ED. Strict intake output, daily weights. -4 L since admission. 2-D echo: LVEF 40-45 percent with diffuse hypokinesis and grade 1 diastolic dysfunction. Cardiology was consulted and input appreciated. Had reduced Lasix to 40 mg IV daily and added lisinopril 5 MG daily. They suspect his presentation is due to CAD and plan medical optimization followed by left and right heart On 04/25/16. On 1/14, patient complained of dizziness and near syncope and hence cardiology discontinued Lasix and have reduced  lisinopril to 2.5 MG daily. 2. Cardiomyopathy: Management as indicated above. 3. 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 Continue aspirin, atorvastatin. 4. Chest pain, atypical: EKG without acute changes. Troponin essentially negative. Aspirin 81 MG daily. Management as indicated above. As per cardiology, if has recurrence of chest pain then start heparin. 5. COPD: Patient's dyspnea may be a combination of decompensated CHF and underlying COPD as evidenced on CT chest. No PE noted. No clinical bronchospasm. When necessary oxygen and bronchodilators. No hypoxia with ambulation. Symptomatically improved. 6. Essential hypertension: Started lisinopril. Tapering down clonidine to discontinue. When necessary IV hydralazine. On 1/14, due to dizziness and near syncope with associated hypotension, Lasix discontinued, lisinopril was reduced from 5 mg to 2.5 MG daily. If continues to have soft blood pressures, may consider stopping clonidine altogether. 7. Hyperlipidemia: Continue statins. 8. Type II DM: Hold oral hypoglycemics. A1c: 9.5 suggesting very poor outpatient control. May need to consider insulin's at discharge. SSI. 9. Chronic pain: Continue home pain regimen. As discussed with pharmacy verified that patient's fentanyl pump is not due for refill for a couple of weeks. Controlled. 10. GERD/status post recent esophageal dilatation: Continue home regimen of PPI, H2 blockers and sucralfate. Unsure of his atypical chest pain is GI in origin. Lipase normal. Abdominal ultrasound without acute findings. 11. Dizziness/near syncope: Soft blood pressures. Medication changes as above. Monitor. 12. Hypokalemia: Replace and follow.   DVT prophylaxis: Lovenox Code Status: Full Family Communication: Discussed with spouse in detail. Disposition Plan: Patient has acute systolic CHF requiring IV diuresis. 2-D echo shows new cardiomyopathy. His presentation is suspicious for coronary artery disease  and cardiology plans left and right heart  cath on 1/15 after medical optimization. He meets criteria for inpatient admission and will certainly need to midnight hospital stay since admission. DC home when medically stable.   Consultants:   Cardiology  Procedures:   2-D echo 04/23/16: Study Conclusions  - Left ventricle: The cavity size was normal. There was moderate   concentric hypertrophy. Systolic function was mildly to   moderately reduced. The estimated ejection fraction was in the   range of 40% to 45%. Diffuse hypokinesis. Doppler parameters are   consistent with abnormal left ventricular relaxation (grade 1   diastolic dysfunction). Doppler parameters are consistent with   elevated ventricular end-diastolic filling pressure. - Ventricular septum: Septal motion showed paradox. - Aortic valve: Trileaflet; mildly thickened, mildly calcified   leaflets. There was mild stenosis. Mean gradient (S): 8 mm Hg.   Peak gradient (S): 19 mm Hg. Valve area (VTI): 2.49 cm^2. Valve   area (Vmax): 2.19 cm^2. Valve area (Vmean): 2.31 cm^2. - Mitral valve: There was mild regurgitation. - Left atrium: The atrium was mildly dilated. - Right ventricle: Systolic function was normal. - Pulmonary arteries: Systolic pressure was within the normal   range.  Impressions:  - There is moderate concentric LVH with diffuse hypokinesis and   paradoxical septal motion consistent with LBBB. LVEF 40-45%.   Filling pressures are elevated.  Antimicrobials:   None    Subjective: Patient was seen this morning prior to cardiology follow-up. He was still in bed. Reported brief episode of orthopnea which has since resolved. Complained of lower extremity cramps. Also did not sleep well last night for no particular reason.  Objective:  Vitals:   04/23/16 2211 04/24/16 0143 04/24/16 0601 04/24/16 0702  BP: (!) 105/53 106/81 (!) 110/53   Pulse: 96 87 89   Resp: 16 16 18    Temp: 97.6 F (36.4 C) 98 F  (36.7 C) 97.8 F (36.6 C)   TempSrc: Oral Oral Oral   SpO2: 95% 92% 94%   Weight:    92.3 kg (203 lb 6.4 oz)  Height:        Intake/Output Summary (Last 24 hours) at 04/24/16 1256 Last data filed at 04/24/16 0900  Gross per 24 hour  Intake              940 ml  Output             2500 ml  Net            -1560 ml   Filed Weights   04/22/16 1832 04/23/16 0416 04/24/16 0702  Weight: 91.5 kg (201 lb 11.2 oz) 91.4 kg (201 lb 8 oz) 92.3 kg (203 lb 6.4 oz)    Examination:  General exam: Pleasant elderly male lying comfortably supine in bed. Respiratory system: clear to auscultation. Respiratory effort normal. Cardiovascular system: S1 & S2 heard, RRR. No JVD, murmurs, rubs, gallops or clicks. No pedal edema. Telemetry: Sinus rhythm, BBB morphology. Gastrointestinal system: Abdomen is nondistended, soft and nontender. No organomegaly or masses felt. Normal bowel sounds heard. Central nervous system: Alert and oriented. No focal neurological deficits. Extremities: Symmetric 5 x 5 power. Skin: No rashes, lesions or ulcers Psychiatry: Judgement and insight appear normal. Mood & affect appropriate.     Data Reviewed: I have personally reviewed following labs and imaging studies  CBC:  Recent Labs Lab 04/22/16 1322  WBC 5.8  HGB 12.3*  HCT 36.3*  MCV 87.1  PLT [id number]   Basic Metabolic Panel:  Recent Labs Lab 04/22/16 1322  04/23/16 0915 04/24/16 0355  NA 140 139 137  K 3.9 3.7 3.2*  CL 106 100* 97*  CO2 24 29 29   GLUCOSE 169* 154* 140*  BUN 5* 7 9  CREATININE 0.65 0.73 0.87  CALCIUM 9.5 8.8* 8.8*   GFR: Estimated Creatinine Clearance: 86.4 mL/min (by C-G formula based on SCr of 0.87 mg/dL). Liver Function Tests:  Recent Labs Lab 04/22/16 1322  AST 24  ALT 12*  ALKPHOS 75  BILITOT 0.7  PROT 7.1  ALBUMIN 3.6    Recent Labs Lab 04/22/16 1322  LIPASE 25   No results for input(s): AMMONIA in the last 168 hours. Coagulation Profile: No results for  input(s): INR, PROTIME in the last 168 hours. Cardiac Enzymes:  Recent Labs Lab 04/22/16 1950 04/23/16 0218 04/23/16 0915  TROPONINI 0.03* <0.03 <0.03   BNP (last 3 results) No results for input(s): PROBNP in the last 8760 hours. HbA1C:  Recent Labs  04/22/16 1950  HGBA1C 9.5*   CBG:  Recent Labs Lab 04/23/16 1119 04/23/16 1608 04/23/16 2210 04/24/16 0600 04/24/16 1109  GLUCAP 205* 151* 176* 149* 213*   Lipid Profile: No results for input(s): CHOL, HDL, LDLCALC, TRIG, CHOLHDL, LDLDIRECT in the last 72 hours. Thyroid Function Tests:  Recent Labs  04/22/16 1950  TSH 2.374   Anemia Panel: No results for input(s): VITAMINB12, FOLATE, FERRITIN, TIBC, IRON, RETICCTPCT in the last 72 hours.  Sepsis Labs:  Recent Labs Lab 04/22/16 1322  WBC 5.8    No results found for this or any previous visit (from the past 240 hour(s)).       Radiology Studies: Dg Chest 2 View  Result Date: 04/22/2016 CLINICAL DATA:  Shortness of breath. EXAM: CHEST  2 VIEW COMPARISON:  02/14/2016 . FINDINGS: Cardiomegaly with diffuse bilateral pulmonary interstitial prominence and bilateral pleural effusions consistent congestive heart failure. Pneumonitis cannot be excluded . Findings have progressed from prior exam. Neurostimulator leads in stable position. IMPRESSION: Cardiomegaly with diffuse bilateral from interstitial prominence and bilateral pleural effusions consistent congestive heart failure. Pneumonitis cannot be excluded. Findings have progressed from prior exam. Electronically Signed   By: [place]   On: 04/22/2016 14:10   Ct Angio Chest Pe W Or Wo Contrast  Result Date: 04/22/2016 CLINICAL DATA:  71 y/o  M; shortness of breath and tachycardia. EXAM: CT ANGIOGRAPHY CHEST WITH CONTRAST TECHNIQUE: Multidetector CT imaging of the chest was performed using the standard protocol during bolus administration of intravenous contrast. Multiplanar CT image reconstructions and MIPs  were obtained to evaluate the vascular anatomy. CONTRAST:  100 cc Isovue 370. COMPARISON:  04/09/2006 CT chest. FINDINGS: Cardiovascular: Satisfactory opacification of the pulmonary arteries to the segmental level. Respiratory motion artifact in the right greater than left lung bases. No evidence of pulmonary embolism. Mild cardiomegaly. Moderate coronary artery calcification. Normal caliber thoracic aorta with mild calcific atherosclerosis. Normal caliber main pulmonary artery. Mediastinum/Nodes: No enlarged mediastinal, hilar, or axillary lymph nodes. Thyroid gland, trachea, and esophagus demonstrate no significant findings. Lungs/Pleura: Clusters of nodules within the periphery of the lung in the left upper lobe and right upper lobe measuring up to 4 mm. Stable 5.6 mm nodule in the right lung apex. Biapical moderate centrilobular pulmonary emphysema. Interlobular septal thickening in hazy opacity of lung parenchyma probably represents interstitial pulmonary edema. Small bilateral pleural effusions. Upper Abdomen: Nonspecific prominent subcentimeter gastrohepatic lymph nodes. Musculoskeletal: 2 spinal stimulator electrodes are noted within the lower and mid thoracic spine. Mild dextrocurvature of the thoracic spine. No acute  osseous abnormality is evident. Review of the MIP images confirms the above findings. IMPRESSION: 1. No pulmonary embolus identified. Right greater than left lung base respiratory artifact. 2. Small bilateral pleural effusions and interstitial pulmonary edema. 3. Mild-to-moderate lung apex centrilobular emphysema. 4. Mild cardiomegaly.  Moderate coronary artery calcification. 5. Clusters of nodules within periphery of the lungs measuring up to 4 mm. No follow-up needed if patient is low-risk (and has no known or suspected primary neoplasm). Non-contrast chest CT can be considered in 12 months if patient is high-risk. This recommendation follows the consensus statement: Guidelines for  Management of Incidental Pulmonary Nodules Detected on CT Images: From the Fleischner Society 2017; Radiology 2017; 284:228-243. Electronically Signed   By: Kristine Garbe M.D.   On: 04/22/2016 15:42   US Abdomen Complete  Result Date: 04/22/2016 CLINICAL DATA:  Epigastric pain. EXAM: ABDOMEN ULTRASOUND COMPLETE COMPARISON:  CT abdomen and pelvis 06/16/2015. FINDINGS: Gallbladder: No gallstones or wall thickening visualized. No sonographic Murphy sign noted by sonographer. Common bile duct: Diameter: 3.3 mm Liver: Normal echogenicity.  There may be borderline hepatomegaly. IVC: No abnormality visualized. Pancreas: Visualized portion unremarkable. Spleen: Size and appearance within normal limits. Right Kidney: Length: 11.5 cm. Echogenicity within normal limits. No solid mass or hydronephrosis visualized. 1.0 cm cyst identified in the upper pole. Left Kidney: Length: 11.5 cm. Echogenicity within normal limits. No Solid mass or hydronephrosis visualized. 1.7 cm cyst upper pole. Abdominal aorta: Limited visualization, up to 2.2 mm cm in diameter. Other findings: Limited visualization of several structures due to body habitus and bowel gas. IMPRESSION: No acute findings.  No cause seen for epigastric pain. Electronically Signed   By: Staci Righter M.D.   On: 04/22/2016 18:00        Scheduled Meds: . aspirin EC  81 mg Oral Daily  . atorvastatin  40 mg Oral q1800  . cholecalciferol  5,000 Units Oral BID  . cloNIDine  0.1 mg Oral Daily  . docusate sodium  100 mg Oral BID  . DULoxetine  60 mg Oral Daily  . enoxaparin (LOVENOX) injection  40 mg Subcutaneous Q24H  . famotidine  40 mg Oral QHS  . folic acid  1 mg Oral Daily  . insulin aspart  0-9 Units Subcutaneous TID WC  . [START ON 04/25/2016] lisinopril  2.5 mg Oral Daily  . mouth rinse  15 mL Mouth Rinse BID  . metoCLOPramide  10 mg Oral TID AC  . pantoprazole  40 mg Oral BID AC  . potassium chloride  40 mEq Oral Daily  . pregabalin  150  mg Oral BID  . sodium chloride flush  3 mL Intravenous Q12H  . sucralfate  1 g Oral QID  . tamsulosin  0.4 mg Oral QPC supper   Continuous Infusions:   LOS: 1 day     [name] Nuzzo, MD [facility] Pager [phone]  If 7PM-7AM, please contact night-coverage [email or web address] Password [facility] 04/24/2016, 12:56 PM

## 2016-04-24 NOTE — Progress Notes (Addendum)
Subjective:    Episode of SOB last night now resolved. Episode of dizziness with standing this morning.   Objective:   Temp:  [97.6 F (36.4 C)-98 F (36.7 C)] 97.8 F (36.6 C) (01/14 0601) Pulse Rate:  [87-100] 89 (01/14 0601) Resp:  [16-20] 18 (01/14 0601) BP: (105-129)/(53-81) 110/53 (01/14 0601) SpO2:  [92 %-95 %] 94 % (01/14 0601) Weight:  [203 lb 6.4 oz (92.3 kg)] 203 lb 6.4 oz (92.3 kg) (01/14 0702) Last BM Date: 04/23/16  Filed Weights   04/22/16 1832 04/23/16 0416 04/24/16 0702  Weight: 201 lb 11.2 oz (91.5 kg) 201 lb 8 oz (91.4 kg) 203 lb 6.4 oz (92.3 kg)    Intake/Output Summary (Last 24 hours) at 04/24/16 1123 Last data filed at 04/24/16 0900  Gross per 24 hour  Intake              940 ml  Output             2500 ml  Net            -1560 ml    Telemetry: SR  Exam:  General: NAD  HEENT: sclera clear, throat clear  Resp: CTAB  Cardiac: RRR, no m/r/g, no jvd  GI: abdomen soft, NT, ND  MSK: no LE edema  Neuro: no focal deficits  Psych: appropriate affect  Lab Results:  Basic Metabolic Panel:  Recent Labs Lab 04/22/16 1322 04/23/16 0915 04/24/16 0355  NA 140 139 137  K 3.9 3.7 3.2*  CL 106 100* 97*  CO2 24 29 29   GLUCOSE 169* 154* 140*  BUN 5* 7 9  CREATININE 0.65 0.73 0.87  CALCIUM 9.5 8.8* 8.8*    Liver Function Tests:  Recent Labs Lab 04/22/16 1322  AST 24  ALT 12*  ALKPHOS 75  BILITOT 0.7  PROT 7.1  ALBUMIN 3.6    CBC:  Recent Labs Lab 04/22/16 1322  WBC 5.8  HGB 12.3*  HCT 36.3*  MCV 87.1  PLT 232    Cardiac Enzymes:  Recent Labs Lab 04/22/16 1950 04/23/16 0218 04/23/16 0915  TROPONINI 0.03* <0.03 <0.03    BNP: No results for input(s): PROBNP in the last 8760 hours.  Coagulation: No results for input(s): INR in the last 168 hours.  ECG:   Medications:   Scheduled Medications: . aspirin EC  81 mg Oral Daily  . atorvastatin  40 mg Oral q1800  . cholecalciferol  5,000 Units Oral BID    . cloNIDine  0.1 mg Oral Daily  . docusate sodium  100 mg Oral BID  . DULoxetine  60 mg Oral Daily  . enoxaparin (LOVENOX) injection  40 mg Subcutaneous Q24H  . famotidine  40 mg Oral QHS  . folic acid  1 mg Oral Daily  . furosemide  40 mg Intravenous Daily  . insulin aspart  0-9 Units Subcutaneous TID WC  . lisinopril  5 mg Oral Daily  . mouth rinse  15 mL Mouth Rinse BID  . metoCLOPramide  10 mg Oral TID AC  . pantoprazole  40 mg Oral BID AC  . potassium chloride  40 mEq Oral Daily  . pregabalin  150 mg Oral BID  . sodium chloride flush  3 mL Intravenous Q12H  . sucralfate  1 g Oral QID  . tamsulosin  0.4 mg Oral QPC supper     Infusions:   PRN Medications:  acetaminophen **OR** acetaminophen, albuterol, cyclobenzaprine, hydrALAZINE, traMADol     Assessment/Plan  1. Chest pain/DOE/Acute systolic HF - severe symptoms of DOE with onset about 2 days ago - echo with LVEF 40-45%. EKG chronic LBBB. He has several CAD risk factors including HTN, HL, poorly controlled DM2 with HgbA1c of 9, former tobacco, brother with history of CABG around age 46 - given the constellation of symptoms, risk factors, decreased LVEF, and LBBB on EKG high concern for CAD as etiology of his symptoms and his cardiomyopathy. We will plan for LHC/RHC on Monday - negative 2  liters yesterday, negative 4 liters since admission, improving symptoms. He is on lasix 40mg  IV daily. Stable renal function  - medical therapy with ASA 81, atorva 40, lisionpril 5. Orthostatic dizziness this AM. Will decrease lisionpril to 2.5mg  daily, d/c lasix. Hold on starting beta blocker at this time. Check orthostatics - if recurrence of chest pain would start heparin at that time. Plan for cath tomorrow.   2. HTN - wean clonidine in favor of more beneficial CHF medications. Changed clonidine to 0.1mg  daily yesterday, likely d/c all together in the next few days.   3. Hypokalemia - replacement per primary team  4.  Dizziness - severe episode of dizziness and near syncope while standing today. Suspect due to combinate of diuresis and bp meds. Changes as made above. Check orthostatics    I have reviewed the risks, indications, and alternatives to cardiac catheterization, possible angioplasty, and stenting with the patient and his wife today. Risks include but are not limited to bleeding, infection, vascular injury, stroke, myocardial infection, arrhythmia, kidney injury, radiation-related injury in the case of prolonged fluoroscopy use, emergency cardiac surgery, and death. The patient understands the risks of serious complication is 1-2 in [id number] with diagnostic cardiac cath and 1-2% or less with angioplasty/stenting.      Carlyle Dolly, M.D.

## 2016-04-25 ENCOUNTER — Encounter (HOSPITAL_COMMUNITY)
Admission: EM | Disposition: A | Discharge status: Home / Self Care | Payer: Self-pay | Source: Home / Self Care | Attending: Internal Medicine

## 2016-04-25 DIAGNOSIS — J810 Acute pulmonary edema: Secondary | ICD-10-CM

## 2016-04-25 DIAGNOSIS — I10 Essential (primary) hypertension: Secondary | ICD-10-CM

## 2016-04-25 DIAGNOSIS — I2510 Atherosclerotic heart disease of native coronary artery without angina pectoris: Secondary | ICD-10-CM

## 2016-04-25 DIAGNOSIS — I5043 Acute on chronic combined systolic (congestive) and diastolic (congestive) heart failure: Secondary | ICD-10-CM

## 2016-04-25 HISTORY — PX: CARDIAC CATHETERIZATION: SHX172

## 2016-04-25 LAB — GLUCOSE, CAPILLARY
GLUCOSE-CAPILLARY: 176 mg/dL — AB (ref 65–99)
GLUCOSE-CAPILLARY: 159 mg/dL — AB (ref 65–99)
GLUCOSE-CAPILLARY: 139 mg/dL — AB (ref 65–99)
Glucose-Capillary: 161 mg/dL — ABNORMAL HIGH (ref 65–99)
Glucose-Capillary: 214 mg/dL — ABNORMAL HIGH (ref 65–99)

## 2016-04-25 LAB — BASIC METABOLIC PANEL
Anion gap: 13 (ref 5–15)
Anion gap: 9 (ref 5–15)
BUN: 12 mg/dL (ref 6–20)
BUN: 8 mg/dL (ref 6–20)
CALCIUM: 8.9 mg/dL (ref 8.9–10.3)
CHLORIDE: 100 mmol/L — AB (ref 101–111)
CO2: 25 mmol/L (ref 22–32)
CO2: 27 mmol/L (ref 22–32)
CREATININE: 0.67 mg/dL (ref 0.61–1.24)
Calcium: 9.8 mg/dL (ref 8.9–10.3)
Chloride: 102 mmol/L (ref 101–111)
Creatinine, Ser: 0.64 mg/dL (ref 0.61–1.24)
GFR calc Af Amer: >60 mL/min (ref >60)
GFR calc Af Amer: >60 mL/min (ref >60)
GLUCOSE: 163 mg/dL — AB (ref 65–99)
GLUCOSE: 191 mg/dL — AB (ref 65–99)
POTASSIUM: 4 mmol/L (ref 3.5–5.1)
Potassium: 3.6 mmol/L (ref 3.5–5.1)
SODIUM: 138 mmol/L (ref 135–145)
Sodium: 138 mmol/L (ref 135–145)

## 2016-04-25 LAB — POCT I-STAT 3, VENOUS BLOOD GAS (G3P V)
Acid-Base Excess: 2 mmol/L (ref 0.0–2.0)
Bicarbonate: 27 mmol/L (ref 20.0–28.0)
O2 Saturation: 64 %
TCO2: 28 mmol/L (ref 0–100)
pCO2, Ven: 42.4 mmHg — ABNORMAL LOW (ref 44.0–60.0)
pH, Ven: 7.412 (ref 7.250–7.430)
pO2, Ven: 33 mmHg (ref 32.0–45.0)

## 2016-04-25 LAB — CBC
HCT: 38.6 % — ABNORMAL LOW (ref 39.0–52.0)
Hemoglobin: 12.5 g/dL — ABNORMAL LOW (ref 13.0–17.0)
MCH: 28.3 pg (ref 26.0–34.0)
MCHC: 32.4 g/dL (ref 30.0–36.0)
MCV: 87.3 fL (ref 78.0–100.0)
PLATELETS: 249 10*3/uL (ref 150–400)
RBC: 4.42 MIL/uL (ref 4.22–5.81)
RDW: 14.7 % (ref 11.5–15.5)
WBC: 7.8 10*3/uL (ref 4.0–10.5)

## 2016-04-25 LAB — POCT I-STAT 3, ART BLOOD GAS (G3+)
ACID-BASE EXCESS: 3 mmol/L — AB (ref 0.0–2.0)
BICARBONATE: 27.5 mmol/L (ref 20.0–28.0)
O2 Saturation: 94 %
PH ART: 7.411 (ref 7.350–7.450)
PO2 ART: 73 mmHg — AB (ref 83.0–108.0)
TCO2: 29 mmol/L (ref 0–100)
pCO2 arterial: 43.3 mmHg (ref 32.0–48.0)

## 2016-04-25 LAB — PROTIME-INR
INR: 1
Prothrombin Time: 13.2 seconds (ref 11.4–15.2)

## 2016-04-25 LAB — MAGNESIUM: Magnesium: 2.4 mg/dL (ref 1.7–2.4)

## 2016-04-25 SURGERY — RIGHT/LEFT HEART CATH AND CORONARY ANGIOGRAPHY
Anesthesia: LOCAL

## 2016-04-25 MED ORDER — FENTANYL CITRATE (PF) 100 MCG/2ML IJ SOLN
INTRAMUSCULAR | Status: DC | PRN
  Administered 2016-04-25: 25 ug via INTRAVENOUS

## 2016-04-25 MED ORDER — LIDOCAINE HCL (PF) 1 % IJ SOLN
INTRAMUSCULAR | Status: AC
  Filled 2016-04-25: qty 30

## 2016-04-25 MED ORDER — HEPARIN (PORCINE) IN NACL 2-0.9 UNIT/ML-% IJ SOLN
INTRAMUSCULAR | Status: DC | PRN
  Administered 2016-04-25: 1000 mL

## 2016-04-25 MED ORDER — SODIUM CHLORIDE 0.9 % WEIGHT BASED INFUSION
1.0000 mL/kg/h | INTRAVENOUS | Status: AC
  Administered 2016-04-25: 1 mL/kg/h via INTRAVENOUS

## 2016-04-25 MED ORDER — MAGNESIUM SULFATE 2 GM/50ML IV SOLN
2.0000 g | Freq: Once | INTRAVENOUS | Status: AC
  Administered 2016-04-25: 2 g via INTRAVENOUS
  Filled 2016-04-25: qty 50

## 2016-04-25 MED ORDER — IOPAMIDOL (ISOVUE-370) INJECTION 76%
INTRAVENOUS | Status: DC | PRN
  Administered 2016-04-25: 80 mL via INTRA_ARTERIAL

## 2016-04-25 MED ORDER — IOPAMIDOL (ISOVUE-370) INJECTION 76%
INTRAVENOUS | Status: AC
  Filled 2016-04-25: qty 100

## 2016-04-25 MED ORDER — ENOXAPARIN SODIUM 40 MG/0.4ML ~~LOC~~ SOLN
40.0000 mg | SUBCUTANEOUS | Status: DC
  Filled 2016-04-25: qty 0.4

## 2016-04-25 MED ORDER — MIDAZOLAM HCL 2 MG/2ML IJ SOLN
INTRAMUSCULAR | Status: DC | PRN
Start: 2016-04-25 — End: 2016-04-25
  Administered 2016-04-25: 1 mg via INTRAVENOUS

## 2016-04-25 MED ORDER — SODIUM CHLORIDE 0.9% FLUSH
3.0000 mL | Freq: Two times a day (BID) | INTRAVENOUS | Status: DC
  Administered 2016-04-25 – 2016-04-26 (×2): 3 mL via INTRAVENOUS

## 2016-04-25 MED ORDER — SODIUM CHLORIDE 0.9 % IV SOLN: 250.0000 mL | INTRAVENOUS | Status: DC | PRN

## 2016-04-25 MED ORDER — HEPARIN SODIUM (PORCINE) 1000 UNIT/ML IJ SOLN
INTRAMUSCULAR | Status: AC
  Filled 2016-04-25: qty 1

## 2016-04-25 MED ORDER — FENTANYL CITRATE (PF) 100 MCG/2ML IJ SOLN
INTRAMUSCULAR | Status: AC
  Filled 2016-04-25: qty 2

## 2016-04-25 MED ORDER — HEPARIN (PORCINE) IN NACL 2-0.9 UNIT/ML-% IJ SOLN
INTRAMUSCULAR | Status: AC
  Filled 2016-04-25: qty 1000

## 2016-04-25 MED ORDER — MIDAZOLAM HCL 2 MG/2ML IJ SOLN
INTRAMUSCULAR | Status: AC
  Filled 2016-04-25: qty 2

## 2016-04-25 MED ORDER — SODIUM CHLORIDE 0.9% FLUSH: 3.0000 mL | INTRAVENOUS | Status: DC | PRN

## 2016-04-25 MED ORDER — VERAPAMIL HCL 2.5 MG/ML IV SOLN
INTRAVENOUS | Status: AC
  Filled 2016-04-25: qty 2

## 2016-04-25 MED ORDER — LIDOCAINE HCL (PF) 1 % IJ SOLN
INTRAMUSCULAR | Status: DC | PRN
  Administered 2016-04-25: 15 mL

## 2016-04-25 SURGICAL SUPPLY — 14 items
CATH 5FR JL3.5 JR4 ANG PIG MP (CATHETERS) ×1 IMPLANT
CATH SWAN GANZ 7F STRAIGHT (CATHETERS) ×1 IMPLANT
DEVICE RAD COMP TR BAND LRG (VASCULAR PRODUCTS) ×1 IMPLANT
GLIDESHEATH SLEND SS 6F .021 (SHEATH) ×1 IMPLANT
GUIDEWIRE INQWIRE 1.5J.035X260 (WIRE) IMPLANT
INQWIRE 1.5J .035X260CM (WIRE) ×2
KIT HEART LEFT (KITS) ×2 IMPLANT
PACK CARDIAC CATHETERIZATION (CUSTOM PROCEDURE TRAY) ×2 IMPLANT
SHEATH PINNACLE 5F 10CM (SHEATH) ×1 IMPLANT
SHEATH PINNACLE 7F 10CM (SHEATH) ×1 IMPLANT
SYR MEDRAD MARK V 150ML (SYRINGE) ×2 IMPLANT
TRANSDUCER W/STOPCOCK (MISCELLANEOUS) ×2 IMPLANT
TUBING CIL FLEX 10 FLL-RA (TUBING) ×2 IMPLANT
WIRE EMERALD 3MM-J .035X150CM (WIRE) ×1 IMPLANT

## 2016-04-25 NOTE — Progress Notes (Signed)
Patient high fall risk and patient declined bed alarm

## 2016-04-25 NOTE — Progress Notes (Signed)
PROGRESS NOTE  James Parsons  [id number] DOB: May 26, 1945  DOA: 04/22/2016 PCP: Alesia Richards, MD   Brief Narrative:  71 y.o. male , married, lives with spouse, ambulates with the help of a cane, physically not very active, PMH of DM 2, HTN, COPD not on home oxygen, former heavy smoker quit 15 years ago, depression, GERD, status post recent esophageal stricture dilatation recently, HLD, chronic back pain status post spinal stimulator and fentanyl pump management and outpatient pain management, presented to [facility] ED with complaints of dyspnea and atypical chest pain. Admitted for acute CHF, unknown type initially due to no echo. Clinically improved. Now cardiomyopathy on echo. Cardiology consulted and suspect his presentation is due to CAD and plan continued medical optimization and left and right heart cath on 04/26/15.  Assessment & Plan:   Principal Problem:   Acute exacerbation of CHF (congestive heart failure) (HCC) Active Problems:   Mixed hyperlipidemia   Essential hypertension   Venous (peripheral) insufficiency   COPD (chronic obstructive pulmonary disease) (HCC)   T2_NIDDM   Chronic pain associated with significant psychosocial dysfunction   Spinal cord stimulator status   RSD (reflex sympathetic dystrophy)   Atypical chest pain   Acute systolic CHF (congestive heart failure) (HCC)   Hypokalemia   1. Acute systolic CHF: Admitted to telemetry. Improved after a dose of IV Lasix 40 mg 1 in ED. Strict intake output, daily weights. -4 L since admission. 2-D echo: LVEF 40-45 percent with diffuse hypokinesis and grade 1 diastolic dysfunction. Cardiology was consulted and input appreciated. Had reduced Lasix to 40 mg IV daily and added lisinopril 5 MG daily. They suspect his presentation is due to CAD and plan medical optimization followed by left and right heart On 04/25/16. On 1/14, patient complained of dizziness and near syncope and hence cardiology discontinued Lasix and have  reduced lisinopril to 2.5 MG daily. Volume status looks good. 2. Cardiomyopathy: Management as indicated above. 3. 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 Continue aspirin, atorvastatin. 4. Chest pain, atypical: EKG without acute changes. Troponin essentially negative. Aspirin 81 MG daily. Management as indicated above. As per cardiology, if has recurrence of chest pain then start heparin. No chest pain recurrence. 5. COPD: Patient's dyspnea may be a combination of decompensated CHF and underlying COPD as evidenced on CT chest. No PE noted. No clinical bronchospasm. When necessary oxygen and bronchodilators. No hypoxia with ambulation. Symptomatically improved. 6. Essential hypertension: Started lisinopril. Tapering down clonidine to discontinue. When necessary IV hydralazine. On 1/14, due to dizziness and near syncope with associated hypotension, Lasix discontinued, lisinopril was reduced from 5 mg to 2.5 MG daily. Blood pressures have improved. 7. Hyperlipidemia: Continue statins. 8. Type II DM: Hold oral hypoglycemics. A1c: 9.5 suggesting very poor outpatient control. May need to consider insulin's at discharge. SSI. 9. Chronic pain: Continue home pain regimen. As discussed with pharmacy verified that patient's fentanyl pump is not due for refill for a couple of weeks. Controlled. 10. GERD/status post recent esophageal dilatation: Continue home regimen of PPI, H2 blockers and sucralfate. Unsure of his atypical chest pain is GI in origin. Lipase normal. Abdominal ultrasound without acute findings. 11. Dizziness/near syncope: Soft blood pressures 1/14. Medication changes made as above. Symptoms resolved and blood pressures have improved. 12. Hypokalemia/hypomagnesemia: Replaced K. replace magnesium and follow.   DVT prophylaxis: Lovenox Code Status: Full Family Communication: Discussed with spouse in detail. Disposition Plan: Patient has acute systolic CHF requiring IV diuresis.  2-D echo shows new cardiomyopathy. His  presentation is suspicious for coronary artery disease and cardiology plans left and right heart cath on 1/15 after medical optimization. He meets criteria for inpatient admission and will certainly need to midnight hospital stay since admission. DC home when medically stable, pending cardiac cath and cardiology input.   Consultants:   Cardiology  Procedures:   2-D echo 04/23/16: Study Conclusions  - Left ventricle: The cavity size was normal. There was moderate   concentric hypertrophy. Systolic function was mildly to   moderately reduced. The estimated ejection fraction was in the   range of 40% to 45%. Diffuse hypokinesis. Doppler parameters are   consistent with abnormal left ventricular relaxation (grade 1   diastolic dysfunction). Doppler parameters are consistent with   elevated ventricular end-diastolic filling pressure. - Ventricular septum: Septal motion showed paradox. - Aortic valve: Trileaflet; mildly thickened, mildly calcified   leaflets. There was mild stenosis. Mean gradient (S): 8 mm Hg.   Peak gradient (S): 19 mm Hg. Valve area (VTI): 2.49 cm^2. Valve   area (Vmax): 2.19 cm^2. Valve area (Vmean): 2.31 cm^2. - Mitral valve: There was mild regurgitation. - Left atrium: The atrium was mildly dilated. - Right ventricle: Systolic function was normal. - Pulmonary arteries: Systolic pressure was within the normal   range.  Impressions:  - There is moderate concentric LVH with diffuse hypokinesis and   paradoxical septal motion consistent with LBBB. LVEF 40-45%.   Filling pressures are elevated.  Antimicrobials:   None    Subjective: Again didn't sleep well last night, no particular reason. Had transient episode of waking up for a gasp of air but did not seem like orthopnea. Ambulated to bathroom without dizziness, lightheadedness  Objective:  Vitals:   04/24/16 2255 04/25/16 0500 04/25/16 0657 04/25/16 0957  BP: (!)  118/49  125/62 122/86  Pulse: 99  (!) 102 95  Resp: 16     Temp: 97.9 F (36.6 C)  98 F (36.7 C)   TempSrc: Oral  Oral   SpO2: 94%  100%   Weight:  92.3 kg (203 lb 7.8 oz) 91.5 kg (201 lb 11.2 oz)   Height:        Intake/Output Summary (Last 24 hours) at 04/25/16 1029 Last data filed at 04/25/16 0909  Gross per 24 hour  Intake              480 ml  Output             1800 ml  Net            -1320 ml   Filed Weights   04/24/16 1935 04/25/16 0500 04/25/16 0657  Weight: 92.3 kg (203 lb 7.8 oz) 92.3 kg (203 lb 7.8 oz) 91.5 kg (201 lb 11.2 oz)    Examination:  General exam: Pleasant elderly male lying comfortably supine in bed. Respiratory system: clear to auscultation. Respiratory effort normal. Cardiovascular system: S1 & S2 heard, RRR. No JVD, murmurs, rubs, gallops or clicks. No pedal edema. Telemetry: Sinus rhythm, BBB morphology-ST in the low 100s.. Gastrointestinal system: Abdomen is nondistended, soft and nontender. No organomegaly or masses felt. Normal bowel sounds heard. Central nervous system: Alert and oriented. No focal neurological deficits. Extremities: Symmetric 5 x 5 power. Skin: No rashes, lesions or ulcers Psychiatry: Judgement and insight appear normal. Mood & affect appropriate.     Data Reviewed: I have personally reviewed following labs and imaging studies  CBC:  Recent Labs Lab 04/22/16 1322 04/25/16 0544  WBC 5.8 7.8  HGB 12.3* 12.5*  HCT 36.3* 38.6*  MCV 87.1 87.3  PLT 232 0000000   Basic Metabolic Panel:  Recent Labs Lab 04/22/16 1322 04/23/16 0915 04/24/16 0355 04/25/16 0544  NA 140 139 137 138  K 3.9 3.7 3.2* 3.6  CL 106 100* 97* 100*  CO2 24 29 29 25   GLUCOSE 169* 154* 140* 163*  BUN 5* 7 9 12   CREATININE 0.65 0.73 0.87 0.67  CALCIUM 9.5 8.8* 8.8* 9.8  MG  --   --  1.6*  --    GFR: Estimated Creatinine Clearance: 93.6 mL/min (by C-G formula based on SCr of 0.67 mg/dL). Liver Function Tests:  Recent Labs Lab 04/22/16 1322   AST 24  ALT 12*  ALKPHOS 75  BILITOT 0.7  PROT 7.1  ALBUMIN 3.6    Recent Labs Lab 04/22/16 1322  LIPASE 25   No results for input(s): AMMONIA in the last 168 hours. Coagulation Profile:  Recent Labs Lab 04/25/16 0544  INR 1.00   Cardiac Enzymes:  Recent Labs Lab 04/22/16 1950 04/23/16 0218 04/23/16 0915  TROPONINI 0.03* <0.03 <0.03   BNP (last 3 results) No results for input(s): PROBNP in the last 8760 hours. HbA1C:  Recent Labs  04/22/16 1950  HGBA1C 9.5*   CBG:  Recent Labs Lab 04/24/16 0600 04/24/16 1109 04/24/16 1623 04/24/16 2120 04/25/16 0652  GLUCAP 149* 213* 167* 162* 161*   Lipid Profile: No results for input(s): CHOL, HDL, LDLCALC, TRIG, CHOLHDL, LDLDIRECT in the last 72 hours. Thyroid Function Tests:  Recent Labs  04/22/16 1950  TSH 2.374   Anemia Panel: No results for input(s): VITAMINB12, FOLATE, FERRITIN, TIBC, IRON, RETICCTPCT in the last 72 hours.  Sepsis Labs:  Recent Labs Lab 04/22/16 1322 04/25/16 0544  WBC 5.8 7.8    No results found for this or any previous visit (from the past 240 hour(s)).       Radiology Studies: No results found.      Scheduled Meds: . aspirin EC  81 mg Oral Daily  . atorvastatin  40 mg Oral q1800  . cholecalciferol  5,000 Units Oral BID  . cloNIDine  0.1 mg Oral Daily  . docusate sodium  100 mg Oral BID  . DULoxetine  60 mg Oral Daily  . enoxaparin (LOVENOX) injection  40 mg Subcutaneous Q24H  . famotidine  40 mg Oral QHS  . folic acid  1 mg Oral Daily  . insulin aspart  0-9 Units Subcutaneous TID WC  . lisinopril  2.5 mg Oral Daily  . mouth rinse  15 mL Mouth Rinse BID  . metoCLOPramide  10 mg Oral TID AC  . pantoprazole  40 mg Oral BID AC  . potassium chloride  40 mEq Oral Daily  . pregabalin  150 mg Oral BID  . sodium chloride flush  3 mL Intravenous Q12H  . sodium chloride flush  3 mL Intravenous Q12H  . sucralfate  1 g Oral QID  . tamsulosin  0.4 mg Oral QPC  supper   Continuous Infusions: . sodium chloride 10 mL/hr (04/25/16 0831)     LOS: 2 days     [facility], MD [facility] Pager [phone]  If 7PM-7AM, please contact night-coverage [email or web address] Password [facility] 04/25/2016, 10:29 AM

## 2016-04-25 NOTE — Plan of Care (Signed)
Problem: Cardiovascular: Goal: Ability to achieve and maintain adequate cardiovascular perfusion will improve Outcome: Progressing For Cardiac Catheterization today

## 2016-04-25 NOTE — H&P (View-Only) (Signed)
Patient Name: James Parsons Date of Encounter: 04/25/2016  Primary Cardiologist: [facility] Problem List     Principal Problem:   Acute exacerbation of CHF (congestive heart failure) ([place]) Active Problems:   Mixed hyperlipidemia   Essential hypertension   Venous (peripheral) insufficiency   COPD (chronic obstructive pulmonary disease) (HCC)   T2_NIDDM   Chronic pain associated with significant psychosocial dysfunction   Spinal cord stimulator status   RSD (reflex sympathetic dystrophy)   Atypical chest pain   Acute systolic CHF (congestive heart failure) (HCC)   Hypokalemia     Subjective   No chest pain but lying flat he has SOB and chest pressure.   Inpatient Medications    Scheduled Meds: . aspirin EC  81 mg Oral Daily  . atorvastatin  40 mg Oral q1800  . cholecalciferol  5,000 Units Oral BID  . cloNIDine  0.1 mg Oral Daily  . docusate sodium  100 mg Oral BID  . DULoxetine  60 mg Oral Daily  . enoxaparin (LOVENOX) injection  40 mg Subcutaneous Q24H  . famotidine  40 mg Oral QHS  . folic acid  1 mg Oral Daily  . insulin aspart  0-9 Units Subcutaneous TID WC  . lisinopril  2.5 mg Oral Daily  . magnesium sulfate 1 - 4 g bolus IVPB  2 g Intravenous Once  . mouth rinse  15 mL Mouth Rinse BID  . metoCLOPramide  10 mg Oral TID AC  . pantoprazole  40 mg Oral BID AC  . potassium chloride  40 mEq Oral Daily  . pregabalin  150 mg Oral BID  . sodium chloride flush  3 mL Intravenous Q12H  . sodium chloride flush  3 mL Intravenous Q12H  . sucralfate  1 g Oral QID  . tamsulosin  0.4 mg Oral QPC supper   Continuous Infusions: . sodium chloride 10 mL/hr (04/25/16 0831)   PRN Meds: sodium chloride, acetaminophen **OR** acetaminophen, albuterol, cyclobenzaprine, hydrALAZINE, sodium chloride flush, traMADol   Vital Signs    Vitals:   04/24/16 2255 04/25/16 0500 04/25/16 0657 04/25/16 0957  BP: (!) 118/49  125/62 122/86  Pulse: 99  (!) 102 95  Resp: 16       Temp: 97.9 F (36.6 C)  98 F (36.7 C)   TempSrc: Oral  Oral   SpO2: 94%  100%   Weight:  203 lb 7.8 oz (92.3 kg) 201 lb 11.2 oz (91.5 kg)   Height:        Intake/Output Summary (Last 24 hours) at 04/25/16 1108 Last data filed at 04/25/16 0909  Gross per 24 hour  Intake              480 ml  Output             1500 ml  Net            -1020 ml   Filed Weights   04/24/16 1935 04/25/16 0500 04/25/16 0657  Weight: 203 lb 7.8 oz (92.3 kg) 203 lb 7.8 oz (92.3 kg) 201 lb 11.2 oz (91.5 kg)    Physical Exam   GEN: Well nourished,  in no acute distress.  HEENT: normocephalic, sclera clear, mucus membranes moist.  Neck: Supple, no JVD, sitting upright Cardiac: RRR, no murmurs, rubs, or gallops. No clubbing, cyanosis, edema.  Radials/DP/PT 2+ and equal bilaterally.  Respiratory:  Respirations regular and unlabored, clear to auscultation bilaterally without rales, rhonchi or wheezes. GI: obese,Abd -Soft, nontender, nondistended,  BS + x 4. MS: no deformity or atrophy. Skin: warm and dry, brisk capillary refill, no obvious rash Neuro:  Alert and oriented X 3 MAE, follows commands Psych: answers questions appropriately,Normal and pleasant affect.   Labs    CBC  Recent Labs  04/22/16 1322 04/25/16 0544  WBC 5.8 7.8  HGB 12.3* 12.5*  HCT 36.3* 38.6*  MCV 87.1 87.3  PLT 232 0000000   Basic Metabolic Panel  Recent Labs  04/24/16 0355 04/25/16 0544  NA 137 138  K 3.2* 3.6  CL 97* 100*  CO2 29 25  GLUCOSE 140* 163*  BUN 9 12  CREATININE 0.87 0.67  CALCIUM 8.8* 9.8  MG 1.6*  --    Liver Function Tests  Recent Labs  04/22/16 1322  AST 24  ALT 12*  ALKPHOS 75  BILITOT 0.7  PROT 7.1  ALBUMIN 3.6    Recent Labs  04/22/16 1322  LIPASE 25   Cardiac Enzymes  Recent Labs  04/22/16 1950 04/23/16 0218 04/23/16 0915  TROPONINI 0.03* <0.03 <0.03   BNP Invalid input(s): POCBNP D-Dimer No results for input(s): DDIMER in the last 72 hours. Hemoglobin  A1C  Recent Labs  04/22/16 1950  HGBA1C 9.5*   Fasting Lipid Panel No results for input(s): CHOL, HDL, LDLCALC, TRIG, CHOLHDL, LDLDIRECT in the last 72 hours. Thyroid Function Tests  Recent Labs  04/22/16 1950  TSH 2.374    Telemetry    SR to ST LBBB - Personally Reviewed  ECG    No new from 04/23/16 - Personally Reviewed  Radiology    No results found.  Cardiac Studies   Echo: Study Conclusions  - Left ventricle: The cavity size was normal. There was moderate   concentric hypertrophy. Systolic function was mildly to   moderately reduced. The estimated ejection fraction was in the   range of 40% to 45%. Diffuse hypokinesis. Doppler parameters are   consistent with abnormal left ventricular relaxation (grade 1   diastolic dysfunction). Doppler parameters are consistent with   elevated ventricular end-diastolic filling pressure. - Ventricular septum: Septal motion showed paradox. - Aortic valve: Trileaflet; mildly thickened, mildly calcified   leaflets. There was mild stenosis. Mean gradient (S): 8 mm Hg.   Peak gradient (S): 19 mm Hg. Valve area (VTI): 2.49 cm^2. Valve   area (Vmax): 2.19 cm^2. Valve area (Vmean): 2.31 cm^2. - Mitral valve: There was mild regurgitation. - Left atrium: The atrium was mildly dilated. - Right ventricle: Systolic function was normal. - Pulmonary arteries: Systolic pressure was within the normal   range.  Impressions:  - There is moderate concentric LVH with diffuse hypokinesis and   paradoxical septal motion consistent with LBBB. LVEF 40-45%.   Filling pressures are elevated.  Patient Profile     71 y.o.male history of DM2, HTN, COPD, GERD, HL, chronic back pain, chronic LBBB admitted with dyspnea and chest pain.  Worsening symptoms later on at his brothers house, difficultly walking just short distances. Yesterday had severe pressure like epigastric pain, "like elephant on my chest". 7/10 in severity, lasted about 3-4 hours.  Reports severe LE edema over the last few days.   Assessment & Plan    1. Chest pain/DOE/Acute systolic HF - severe symptoms of DOE with onset about 2 days ago - echo with LVEF 40-45%. EKG chronic LBBB. He has several CAD risk factors including HTN, HL, poorly controlled DM2 with HgbA1c of 9, former tobacco, brother with history of CABG around age 52 - given  the constellation of symptoms, risk factors, decreased LVEF, and LBBB on EKG high concern for CAD as etiology of his symptoms and his cardiomyopathy. For LHC/RHC today - negative 1  liters yesterday, negative 4 liters since admission, improving symptoms. He is on lasix 40mg  IV daily. Stable renal function  - medical therapy with ASA 81, atorva 40, lisionpril 5. Orthostatic dizziness this AM. Will decrease lisionpril to 2.5mg  daily, d/c lasix. Hold on starting beta blocker at this time. Check orthostatics - if recurrence of chest pain would start heparin at that time. Plan for cath tomorrow.   2. HTN - wean clonidine in favor of more beneficial CHF medications. Changed clonidine to 0.1mg  daily yesterday, likely d/c all together in the next few days.  ? Stop today vs. tomorrow  3. Hypokalemia - replacement per primary team  4. Dizziness - severe episode of dizziness and near syncope while standing yesterday, no complaints today. Suspect due to combinate of diuresis and bp meds. Changes as made above. Was not orthostatic.    Signed, Cecilie Kicks, NP  04/25/2016, 11:08 AM  [place] Pager [phone]  After 5 or weekends [phone]  Agree with note written by Cecilie Kicks RNP  For R/L heart cath today. New onset CHF with + CRF, mod LV dysfunction and LBBB. Neg enz. Good diuresis. Renal fxn OK.    Quay Burow 04/25/2016 11:42 AM

## 2016-04-25 NOTE — Interval H&P Note (Signed)
History and Physical Interval Note:  04/25/2016 4:11 PM  DESHAY MEDD  has presented today for surgery, with the diagnosis of SOB  The various methods of treatment have been discussed with the patient and family. After consideration of risks, benefits and other options for treatment, the patient has consented to  Procedure(s): Right/Left Heart Cath and Coronary Angiography (N/A) as a surgical intervention .  The patient's history has been reviewed, patient examined, no change in status, stable for surgery.  I have reviewed the patient's chart and labs.  Questions were answered to the patient's satisfaction.   Cath Lab Visit (complete for each Cath Lab visit)  Clinical Evaluation Leading to the Procedure:   ACS: Yes.    Non-ACS:    Anginal Classification: CCS IV  Anti-ischemic medical therapy: Minimal Therapy (1 class of medications)  Non-Invasive Test Results: No non-invasive testing performed  Prior CABG: No previous CABG        Collier [facility] 04/25/2016 4:11 PM

## 2016-04-25 NOTE — Progress Notes (Signed)
Patient Name: James Parsons Date of Encounter: 04/25/2016  Primary Cardiologist: [facility] Problem List     Principal Problem:   Acute exacerbation of CHF (congestive heart failure) ([place]) Active Problems:   Mixed hyperlipidemia   Essential hypertension   Venous (peripheral) insufficiency   COPD (chronic obstructive pulmonary disease) (HCC)   T2_NIDDM   Chronic pain associated with significant psychosocial dysfunction   Spinal cord stimulator status   RSD (reflex sympathetic dystrophy)   Atypical chest pain   Acute systolic CHF (congestive heart failure) (HCC)   Hypokalemia     Subjective   No chest pain but lying flat he has SOB and chest pressure.   Inpatient Medications    Scheduled Meds: . aspirin EC  81 mg Oral Daily  . atorvastatin  40 mg Oral q1800  . cholecalciferol  5,000 Units Oral BID  . cloNIDine  0.1 mg Oral Daily  . docusate sodium  100 mg Oral BID  . DULoxetine  60 mg Oral Daily  . enoxaparin (LOVENOX) injection  40 mg Subcutaneous Q24H  . famotidine  40 mg Oral QHS  . folic acid  1 mg Oral Daily  . insulin aspart  0-9 Units Subcutaneous TID WC  . lisinopril  2.5 mg Oral Daily  . magnesium sulfate 1 - 4 g bolus IVPB  2 g Intravenous Once  . mouth rinse  15 mL Mouth Rinse BID  . metoCLOPramide  10 mg Oral TID AC  . pantoprazole  40 mg Oral BID AC  . potassium chloride  40 mEq Oral Daily  . pregabalin  150 mg Oral BID  . sodium chloride flush  3 mL Intravenous Q12H  . sodium chloride flush  3 mL Intravenous Q12H  . sucralfate  1 g Oral QID  . tamsulosin  0.4 mg Oral QPC supper   Continuous Infusions: . sodium chloride 10 mL/hr (04/25/16 0831)   PRN Meds: sodium chloride, acetaminophen **OR** acetaminophen, albuterol, cyclobenzaprine, hydrALAZINE, sodium chloride flush, traMADol   Vital Signs    Vitals:   04/24/16 2255 04/25/16 0500 04/25/16 0657 04/25/16 0957  BP: (!) 118/49  125/62 122/86  Pulse: 99  (!) 102 95  Resp: 16       Temp: 97.9 F (36.6 C)  98 F (36.7 C)   TempSrc: Oral  Oral   SpO2: 94%  100%   Weight:  203 lb 7.8 oz (92.3 kg) 201 lb 11.2 oz (91.5 kg)   Height:        Intake/Output Summary (Last 24 hours) at 04/25/16 1108 Last data filed at 04/25/16 0909  Gross per 24 hour  Intake              480 ml  Output             1500 ml  Net            -1020 ml   Filed Weights   04/24/16 1935 04/25/16 0500 04/25/16 0657  Weight: 203 lb 7.8 oz (92.3 kg) 203 lb 7.8 oz (92.3 kg) 201 lb 11.2 oz (91.5 kg)    Physical Exam   GEN: Well nourished,  in no acute distress.  HEENT: normocephalic, sclera clear, mucus membranes moist.  Neck: Supple, no JVD, sitting upright Cardiac: RRR, no murmurs, rubs, or gallops. No clubbing, cyanosis, edema.  Radials/DP/PT 2+ and equal bilaterally.  Respiratory:  Respirations regular and unlabored, clear to auscultation bilaterally without rales, rhonchi or wheezes. GI: obese,Abd -Soft, nontender, nondistended,  BS + x 4. MS: no deformity or atrophy. Skin: warm and dry, brisk capillary refill, no obvious rash Neuro:  Alert and oriented X 3 MAE, follows commands Psych: answers questions appropriately,Normal and pleasant affect.   Labs    CBC  Recent Labs  04/22/16 1322 04/25/16 0544  WBC 5.8 7.8  HGB 12.3* 12.5*  HCT 36.3* 38.6*  MCV 87.1 87.3  PLT 232 0000000   Basic Metabolic Panel  Recent Labs  04/24/16 0355 04/25/16 0544  NA 137 138  K 3.2* 3.6  CL 97* 100*  CO2 29 25  GLUCOSE 140* 163*  BUN 9 12  CREATININE 0.87 0.67  CALCIUM 8.8* 9.8  MG 1.6*  --    Liver Function Tests  Recent Labs  04/22/16 1322  AST 24  ALT 12*  ALKPHOS 75  BILITOT 0.7  PROT 7.1  ALBUMIN 3.6    Recent Labs  04/22/16 1322  LIPASE 25   Cardiac Enzymes  Recent Labs  04/22/16 1950 04/23/16 0218 04/23/16 0915  TROPONINI 0.03* <0.03 <0.03   BNP Invalid input(s): POCBNP D-Dimer No results for input(s): DDIMER in the last 72 hours. Hemoglobin  A1C  Recent Labs  04/22/16 1950  HGBA1C 9.5*   Fasting Lipid Panel No results for input(s): CHOL, HDL, LDLCALC, TRIG, CHOLHDL, LDLDIRECT in the last 72 hours. Thyroid Function Tests  Recent Labs  04/22/16 1950  TSH 2.374    Telemetry    SR to ST LBBB - Personally Reviewed  ECG    No new from 04/23/16 - Personally Reviewed  Radiology    No results found.  Cardiac Studies   Echo: Study Conclusions  - Left ventricle: The cavity size was normal. There was moderate   concentric hypertrophy. Systolic function was mildly to   moderately reduced. The estimated ejection fraction was in the   range of 40% to 45%. Diffuse hypokinesis. Doppler parameters are   consistent with abnormal left ventricular relaxation (grade 1   diastolic dysfunction). Doppler parameters are consistent with   elevated ventricular end-diastolic filling pressure. - Ventricular septum: Septal motion showed paradox. - Aortic valve: Trileaflet; mildly thickened, mildly calcified   leaflets. There was mild stenosis. Mean gradient (S): 8 mm Hg.   Peak gradient (S): 19 mm Hg. Valve area (VTI): 2.49 cm^2. Valve   area (Vmax): 2.19 cm^2. Valve area (Vmean): 2.31 cm^2. - Mitral valve: There was mild regurgitation. - Left atrium: The atrium was mildly dilated. - Right ventricle: Systolic function was normal. - Pulmonary arteries: Systolic pressure was within the normal   range.  Impressions:  - There is moderate concentric LVH with diffuse hypokinesis and   paradoxical septal motion consistent with LBBB. LVEF 40-45%.   Filling pressures are elevated.  Patient Profile     71 y.o.male history of DM2, HTN, COPD, GERD, HL, chronic back pain, chronic LBBB admitted with dyspnea and chest pain.  Worsening symptoms later on at his brothers house, difficultly walking just short distances. Yesterday had severe pressure like epigastric pain, "like elephant on my chest". 7/10 in severity, lasted about 3-4 hours.  Reports severe LE edema over the last few days.   Assessment & Plan    1. Chest pain/DOE/Acute systolic HF - severe symptoms of DOE with onset about 2 days ago - echo with LVEF 40-45%. EKG chronic LBBB. He has several CAD risk factors including HTN, HL, poorly controlled DM2 with HgbA1c of 9, former tobacco, brother with history of CABG around age 54 - given  the constellation of symptoms, risk factors, decreased LVEF, and LBBB on EKG high concern for CAD as etiology of his symptoms and his cardiomyopathy. For LHC/RHC today - negative 1  liters yesterday, negative 4 liters since admission, improving symptoms. He is on lasix 40mg  IV daily. Stable renal function  - medical therapy with ASA 81, atorva 40, lisionpril 5. Orthostatic dizziness this AM. Will decrease lisionpril to 2.5mg  daily, d/c lasix. Hold on starting beta blocker at this time. Check orthostatics - if recurrence of chest pain would start heparin at that time. Plan for cath tomorrow.   2. HTN - wean clonidine in favor of more beneficial CHF medications. Changed clonidine to 0.1mg  daily yesterday, likely d/c all together in the next few days.  ? Stop today vs. tomorrow  3. Hypokalemia - replacement per primary team  4. Dizziness - severe episode of dizziness and near syncope while standing yesterday, no complaints today. Suspect due to combinate of diuresis and bp meds. Changes as made above. Was not orthostatic.    Signed, Cecilie Kicks, NP  04/25/2016, 11:08 AM  [place] Pager [phone]  After 5 or weekends [phone]  Agree with note written by Cecilie Kicks RNP  For R/L heart cath today. New onset CHF with + CRF, mod LV dysfunction and LBBB. Neg enz. Good diuresis. Renal fxn OK.    Quay Burow 04/25/2016 11:42 AM

## 2016-04-25 NOTE — Progress Notes (Signed)
Site area: Right groin a 5 french arterial and a 7 french venous sheath was removed  Site Prior to Removal:  Level 0  Pressure Applied For 15 MINUTES    Bedrest Beginning at 1740p  Manual:   Yes.    Patient Status During Pull:  stable  Post Pull Groin Site:  Level 0  Post Pull Instructions Given:  Yes.    Post Pull Pulses Present:  Yes.    Dressing Applied:  Yes.    Comments:  VS remain stable during sheath pull.

## 2016-04-26 ENCOUNTER — Inpatient Hospital Stay (HOSPITAL_COMMUNITY): Payer: Medicare Other

## 2016-04-26 ENCOUNTER — Encounter (HOSPITAL_COMMUNITY): Payer: Self-pay | Admitting: Cardiology

## 2016-04-26 DIAGNOSIS — I5023 Acute on chronic systolic (congestive) heart failure: Secondary | ICD-10-CM

## 2016-04-26 LAB — BASIC METABOLIC PANEL
ANION GAP: 8 (ref 5–15)
BUN: 6 mg/dL (ref 6–20)
CHLORIDE: 102 mmol/L (ref 101–111)
CO2: 26 mmol/L (ref 22–32)
Calcium: 8.8 mg/dL — ABNORMAL LOW (ref 8.9–10.3)
Creatinine, Ser: 0.65 mg/dL (ref 0.61–1.24)
GFR calc Af Amer: >60 mL/min (ref >60)
GFR calc non Af Amer: >60 mL/min (ref >60)
GLUCOSE: 146 mg/dL — AB (ref 65–99)
POTASSIUM: 4 mmol/L (ref 3.5–5.1)
Sodium: 136 mmol/L (ref 135–145)

## 2016-04-26 LAB — GLUCOSE, CAPILLARY
GLUCOSE-CAPILLARY: 169 mg/dL — AB (ref 65–99)
GLUCOSE-CAPILLARY: 146 mg/dL — AB (ref 65–99)

## 2016-04-26 LAB — BRAIN NATRIURETIC PEPTIDE: B Natriuretic Peptide: 226.4 pg/mL — ABNORMAL HIGH (ref 0.0–100.0)

## 2016-04-26 LAB — MAGNESIUM: Magnesium: 2.3 mg/dL (ref 1.7–2.4)

## 2016-04-26 MED ORDER — METFORMIN HCL 500 MG PO TABS
1000.0000 mg | ORAL_TABLET | Freq: Two times a day (BID) | ORAL | Status: DC
Start: 2016-04-28 — End: 2016-06-27

## 2016-04-26 MED ORDER — CARVEDILOL 3.125 MG PO TABS: 3.1250 mg | ORAL_TABLET | Freq: Two times a day (BID) | ORAL | 0 refills | Status: DC

## 2016-04-26 MED ORDER — LISINOPRIL 2.5 MG PO TABS: 2.5000 mg | ORAL_TABLET | Freq: Every day | ORAL | 0 refills | Status: DC

## 2016-04-26 MED ORDER — CARVEDILOL 3.125 MG PO TABS
3.1250 mg | ORAL_TABLET | Freq: Two times a day (BID) | ORAL | Status: DC
  Administered 2016-04-26: 3.125 mg via ORAL
  Filled 2016-04-26: qty 1

## 2016-04-26 MED ORDER — ASPIRIN 81 MG PO TBEC
81.0000 mg | DELAYED_RELEASE_TABLET | Freq: Every day | ORAL | 0 refills | Status: DC
Start: 2016-04-27 — End: 2016-09-08

## 2016-04-26 MED ORDER — FUROSEMIDE 40 MG PO TABS: 40.0000 mg | ORAL_TABLET | Freq: Every day | ORAL | 0 refills | Status: DC

## 2016-04-26 MED ORDER — LIDOCAINE 5 % EX PTCH: 1.0000 | MEDICATED_PATCH | Freq: Every day | CUTANEOUS | Status: AC | PRN

## 2016-04-26 MED ORDER — POTASSIUM CHLORIDE CRYS ER 20 MEQ PO TBCR: 20.0000 meq | EXTENDED_RELEASE_TABLET | Freq: Every day | ORAL | 0 refills | Status: DC

## 2016-04-26 MED FILL — Verapamil HCl IV Soln 2.5 MG/ML: INTRAVENOUS | Qty: 2 | Status: AC

## 2016-04-26 NOTE — Discharge Summary (Signed)
Physician Discharge Summary  James Parsons [id number] DOB: 1945/10/04  PCP: Alesia Richards, MD  Admit date: 04/22/2016 Discharge date: 04/26/2016  Recommendations for Outpatient Follow-up:  1. Dr. Unk Pinto, PCP in 5 days with repeat labs (CBC & BMP). 2. Recommend repeating chest x-ray in 1-2 weeks. [place], PA-C/cardiology on 05/17/16 at 11:30 AM.  Home Health: None Equipment/Devices: None    Discharge Condition: Improved and stable.  CODE STATUS: Full  Diet recommendation: Heart healthy and diabetic diet.  Discharge Diagnoses:  Principal Problem:   Acute exacerbation of CHF (congestive heart failure) (HCC) Active Problems:   Mixed hyperlipidemia   Essential hypertension   Venous (peripheral) insufficiency   COPD (chronic obstructive pulmonary disease) (HCC)   T2_NIDDM   Chronic pain associated with significant psychosocial dysfunction   Spinal cord stimulator status   RSD (reflex sympathetic dystrophy)   Atypical chest pain   Acute systolic CHF (congestive heart failure) (HCC)   Hypokalemia   Acute pulmonary edema (HCC)   Brief/Interim Summary: 71 y.o.male, married, lives with spouse, ambulates with the help of a cane, physically not very active, PMH of DM 2, HTN, COPD not on home oxygen, former heavy smoker quit 15 years ago, depression, GERD, status post recent esophageal stricture dilatation recently, HLD, chronic back pain status post spinal stimulator and fentanyl pump management and outpatient pain management, presented to [facility] ED with complaints of dyspnea and atypical chest pain. Admitted for acute CHF, unknown type initially due to no echo. Clinically improved. Now cardiomyopathy on echo. Cardiology consulted and suspect his presentation is due to CAD and plan continued medical optimization and left and right heart cath on 04/25/69.  Assessment & Plan:   1. Acute systolic CHF: Admitted to telemetry. Improved after a dose of IV Lasix 40 mg 1  in ED. Strict intake output, daily weights. -5.2 L since admission. 2-D echo: LVEF 40-45 percent with diffuse hypokinesis and grade 1 diastolic dysfunction. Cardiology was consulted and input appreciated. Due to diuresis and hypotension related dizziness and near syncope, Lasix was held for the last couple of days and newly started lisinopril was reduced to 2.5 MG daily. Cardiology suspected that his presentation was due to CAD and thereby he underwent cardiac cath after medical optimization. Cardiology has seen him today and cleared him for discharge home on Lasix 40 mg daily, lisinopril 2.5 MG daily, carvedilol 3.125 MG twice a day. He has clinically improved and is euvolemic. Repeat chest x-ray today shows small pleural effusions, improved compared to prior-patient was updated on chest x-ray results. Recommend repeating chest x-ray in 1-2 weeks (attention to the vague nodular density that has developed at the right lung base which is likely artifact, as per radiology report) 2. Nonischemic Cardiomyopathy: Management as indicated above. Above medications can be titrated as outpatient as deemed necessary. 3. Nonobstructive CAD/chronic LBBB: Management as indicated above. Continue aspirin, atorvastatin, beta blockers. Cardiac cath results as below. 4. Chest pain, atypical: EKG without acute changes. Troponin essentially negative. Aspirin 81 MG daily. Management as indicated above. No chest pain recurrence. 5. COPD: Patient's dyspnea may have been a combination of decompensated CHF and underlying COPD as evidenced on CT chest and pleural effusions. No PE noted. No clinical bronchospasm. No hypoxia with ambulation. Improved. 6. Essential hypertension: Started lisinopril. On 1/14, due to dizziness and near syncope with associated hypotension, lisinopril was reduced from 5 mg to 2.5 MG daily. Carvedilol 3.125 MG bid started. Clonidine discontinued. 7. Hyperlipidemia: Continue statins. 8. Type II DM:  Held oral  hypoglycemics while hospitalized and was treated with SSI. A1c: 9.5 suggesting very poor outpatient control. Advised patient to hold metformin for 48 hours post-cath and he verbalized understanding. Outpatient follow-up with PCP for further management of poorly controlled DM. 9. Chronic pain: Continue home pain regimen. Controlled. 10. GERD/status post recent esophageal dilatation: Continue home regimen of PPI, H2 blockers and sucralfate. Lipase normal. Abdominal ultrasound without acute findings. 11. Dizziness/near syncope: Soft blood pressures 1/14. Medication changes made as above. Symptoms resolved and blood pressures have improved. 12. Hypokalemia/hypomagnesemia: Replaced K. Mg 2.3. 13. Wide complex nonsustained tachycardia/? NSVT versus NSSVT: Noted on 04/26/16 early morning. Likely secondary to cardiomyopathy. Potassium and magnesium okay. Carvedilol started.    Consultants:   Cardiology  Procedures:   2-D echo 04/23/16: Study Conclusions  - Left ventricle: The cavity size was normal. There was moderate concentric hypertrophy. Systolic function was mildly to moderately reduced. The estimated ejection fraction was in the range of 40% to 45%. Diffuse hypokinesis. Doppler parameters are consistent with abnormal left ventricular relaxation (grade 1 diastolic dysfunction). Doppler parameters are consistent with elevated ventricular end-diastolic filling pressure. - Ventricular septum: Septal motion showed paradox. - Aortic valve: Trileaflet; mildly thickened, mildly calcified leaflets. There was mild stenosis. Mean gradient (S): 8 mm Hg. Peak gradient (S): 19 mm Hg. Valve area (VTI): 2.49 cm^2. Valve area (Vmax): 2.19 cm^2. Valve area (Vmean): 2.31 cm^2. - Mitral valve: There was mild regurgitation. - Left atrium: The atrium was mildly dilated. - Right ventricle: Systolic function was normal. - Pulmonary arteries: Systolic pressure was within the  normal range.  Impressions:  - There is moderate concentric LVH with diffuse hypokinesis and paradoxical septal motion consistent with LBBB. LVEF 40-45%. Filling pressures are elevated.    04/25/16  Right/Left Heart Cath and Coronary Angiography  Conclusion     There is moderate to severe left ventricular systolic dysfunction.  LV end diastolic pressure is normal.  The left ventricular ejection fraction is 25-35% by visual estimate.  Mid RCA lesion, 40 %stenosed.  Ost Ramus to Ramus lesion, 60 %stenosed.  Ost LAD to Prox LAD lesion, 30 %stenosed.  Prox LAD to Mid LAD lesion, 20 %stenosed.  Hemodynamic findings consistent with mild pulmonary hypertension.  LV end diastolic pressure is normal.   1. Nonobstructive CAD 2. Moderate to severe LV dysfunction with global hypokinesis. EF 35% 3. Borderline pulmonary HTN 4. Normal LV filling pressures.  Plan: medical management.        Discharge Instructions  Discharge Instructions    (HEART FAILURE PATIENTS) Call MD:  Anytime you have any of the following symptoms: 1) 3 pound weight gain in 24 hours or 5 pounds in 1 week 2) shortness of breath, with or without a dry hacking cough 3) swelling in the hands, feet or stomach 4) if you have to sleep on extra pillows at night in order to breathe.    Complete by:  As directed    Call MD for:  difficulty breathing, headache or visual disturbances    Complete by:  As directed    Call MD for:  extreme fatigue    Complete by:  As directed    Call MD for:  persistant dizziness or light-headedness    Complete by:  As directed    Call MD for:  severe uncontrolled pain    Complete by:  As directed    Diet - low sodium heart healthy    Complete by:  As directed    Diet  Carb Modified    Complete by:  As directed    Increase activity slowly    Complete by:  As directed        Medication List    STOP taking these medications   cloNIDine 0.1 MG tablet Commonly known  as:  CATAPRES     TAKE these medications   aspirin 81 MG EC tablet Take 1 tablet (81 mg total) by mouth daily. Start taking on:  04/27/2016   atorvastatin 80 MG tablet Commonly known as:  LIPITOR Take 1/2 to 1 tablet daily for cholesterol as directed   carvedilol 3.125 MG tablet Commonly known as:  COREG Take 1 tablet (3.125 mg total) by mouth 2 (two) times daily with a meal. Start taking on:  04/27/2016   cyclobenzaprine 10 MG tablet Commonly known as:  FLEXERIL Take 10 mg by mouth 3 (three) times daily as needed for muscle spasms. Muscle spasm.   DSS 100 MG Caps Take 100 mg by mouth 2 (two) times daily.   DULoxetine 60 MG capsule Commonly known as:  CYMBALTA Take 60 mg by mouth daily.   folic acid 1 MG tablet Commonly known as:  FOLVITE Take 1 mg by mouth daily.   furosemide 40 MG tablet Commonly known as:  LASIX Take 1 tablet (40 mg total) by mouth daily.   glimepiride 4 MG tablet Commonly known as:  AMARYL TAKE 1 TABLET TWICE DAILY WITH MEALS. DO NOT TAKE IF YOU DO NOT EAT.   lidocaine 5 % Commonly known as:  LIDODERM Place 1 patch onto the skin daily as needed (pain).   lisinopril 2.5 MG tablet Commonly known as:  PRINIVIL,ZESTRIL Take 1 tablet (2.5 mg total) by mouth daily. Start taking on:  04/27/2016   LYRICA 150 MG capsule Generic drug:  pregabalin Take 150 mg by mouth 2 (two) times daily.   metFORMIN 500 MG tablet Commonly known as:  GLUCOPHAGE Take 2 tablets (1,000 mg total) by mouth 2 (two) times daily. Currently held due to contrast your received for heart cath. May restart taking it on 04/28/16. Start taking on:  04/28/2016 What changed:  See the new instructions.   methotrexate 2.5 MG tablet Commonly known as:  RHEUMATREX Take 15 mg by mouth once a week. On Friday, 6 tablets   metoCLOPramide 10 MG tablet Commonly known as:  REGLAN Take 1 tablet (10 mg total) by mouth 3 (three) times daily before meals.   pantoprazole 40 MG tablet Commonly  known as:  PROTONIX Take 1 tablet (40 mg total) by mouth 2 (two) times daily before a meal.   potassium chloride SA 20 MEQ tablet Commonly known as:  K-DUR,KLOR-CON Take 1 tablet (20 mEq total) by mouth daily. Start taking on:  04/27/2016   PRESCRIPTION MEDICATION 0.5 mcg/mL by Pump Prime route continuous. Fentanyl 0.05mg /ml solution 5,000 mcg. Patient will bring dosage instructions with him. Daily dose 0.84mg .   ranitidine 300 MG tablet Commonly known as:  ZANTAC Take 1 tablet (300 mg total) by mouth at bedtime.   sucralfate 1 g tablet Commonly known as:  CARAFATE Take 1 tablet (1 g total) by mouth 4 (four) times daily.   tamsulosin 0.4 MG Caps capsule Commonly known as:  FLOMAX Take 1 capsule (0.4 mg total) by mouth daily after supper.   traMADol 50 MG tablet Commonly known as:  ULTRAM Take 1 tablet (50 mg total) by mouth every 6 (six) hours as needed for moderate pain. pain   triamcinolone cream 0.1 %  Commonly known as:  KENALOG Apply 1 application topically 4 (four) times daily.   Vitamin D-3 5000 units Tabs Take 5,000 Units by mouth 2 (two) times daily.      Follow-up Information    Kerin Ransom, [place] On 05/17/2016.   Specialties:  Cardiology, Radiology Why:  at 11:30am for hospital follow up. Contact information: [address] [place] [address] [phone]        Alesia Richards, MD. Schedule an appointment as soon as possible for a visit in 5 days.   Specialty:  Internal Medicine Why:  To be seen with repeat labs (CBC & BMP). Recommend repeating chest x-ray in 1-2 weeks. Contact information: [address] [place] [address] [phone]          Allergies  Allergen Reactions  . Codeine Itching  . Morphine And Related Other (See Comments)    hallucinations       Procedures/Studies: Dg Chest 2 View  Result Date: 04/26/2016 CLINICAL DATA:  Short of breath EXAM: CHEST  2 VIEW COMPARISON:  04/22/2016  FINDINGS: Mild cardiomegaly. Spinal cord stimulator is stable. Right pleural effusion has improved. Left pleural effusion is stable. Bibasilar interstitial opacities are improved. Upper lungs clear. No pneumothorax. Vague nodular density has developed at the right lateral lung base. IMPRESSION: Improved right pleural effusion.  Stable left pleural effusion. Improved bibasilar interstitial opacities likely reflecting improved edema Vague nodular density has developed at the right lung base which is likely artifact. Attention on short-term follow-up radiograph is recommended. Electronically Signed   By: Marybelle Killings M.D.   On: 04/26/2016 16:38   Dg Chest 2 View  Result Date: 04/22/2016 CLINICAL DATA:  Shortness of breath. EXAM: CHEST  2 VIEW COMPARISON:  02/14/2016 . FINDINGS: Cardiomegaly with diffuse bilateral pulmonary interstitial prominence and bilateral pleural effusions consistent congestive heart failure. Pneumonitis cannot be excluded . Findings have progressed from prior exam. Neurostimulator leads in stable position. IMPRESSION: Cardiomegaly with diffuse bilateral from interstitial prominence and bilateral pleural effusions consistent congestive heart failure. Pneumonitis cannot be excluded. Findings have progressed from prior exam. Electronically Signed   By: [place]   On: 04/22/2016 14:10   Ct Angio Chest Pe W Or Wo Contrast  Result Date: 04/22/2016 CLINICAL DATA:  71 y/o  M; shortness of breath and tachycardia. EXAM: CT ANGIOGRAPHY CHEST WITH CONTRAST TECHNIQUE: Multidetector CT imaging of the chest was performed using the standard protocol during bolus administration of intravenous contrast. Multiplanar CT image reconstructions and MIPs were obtained to evaluate the vascular anatomy. CONTRAST:  100 cc Isovue 370. COMPARISON:  04/09/2006 CT chest. FINDINGS: Cardiovascular: Satisfactory opacification of the pulmonary arteries to the segmental level. Respiratory motion artifact in the  right greater than left lung bases. No evidence of pulmonary embolism. Mild cardiomegaly. Moderate coronary artery calcification. Normal caliber thoracic aorta with mild calcific atherosclerosis. Normal caliber main pulmonary artery. Mediastinum/Nodes: No enlarged mediastinal, hilar, or axillary lymph nodes. Thyroid gland, trachea, and esophagus demonstrate no significant findings. Lungs/Pleura: Clusters of nodules within the periphery of the lung in the left upper lobe and right upper lobe measuring up to 4 mm. Stable 5.6 mm nodule in the right lung apex. Biapical moderate centrilobular pulmonary emphysema. Interlobular septal thickening in hazy opacity of lung parenchyma probably represents interstitial pulmonary edema. Small bilateral pleural effusions. Upper Abdomen: Nonspecific prominent subcentimeter gastrohepatic lymph nodes. Musculoskeletal: 2 spinal stimulator electrodes are noted within the lower and mid thoracic spine. Mild dextrocurvature of the thoracic spine. No  acute osseous abnormality is evident. Review of the MIP images confirms the above findings. IMPRESSION: 1. No pulmonary embolus identified. Right greater than left lung base respiratory artifact. 2. Small bilateral pleural effusions and interstitial pulmonary edema. 3. Mild-to-moderate lung apex centrilobular emphysema. 4. Mild cardiomegaly.  Moderate coronary artery calcification. 5. Clusters of nodules within periphery of the lungs measuring up to 4 mm. No follow-up needed if patient is low-risk (and has no known or suspected primary neoplasm). Non-contrast chest CT can be considered in 12 months if patient is high-risk. This recommendation follows the consensus statement: Guidelines for Management of Incidental Pulmonary Nodules Detected on CT Images: From the Fleischner Society 2017; Radiology 2017; 284:228-243. Electronically Signed   By: Kristine Garbe M.D.   On: 04/22/2016 15:42   US Abdomen Complete  Result Date:  04/22/2016 CLINICAL DATA:  Epigastric pain. EXAM: ABDOMEN ULTRASOUND COMPLETE COMPARISON:  CT abdomen and pelvis 06/16/2015. FINDINGS: Gallbladder: No gallstones or wall thickening visualized. No sonographic Murphy sign noted by sonographer. Common bile duct: Diameter: 3.3 mm Liver: Normal echogenicity.  There may be borderline hepatomegaly. IVC: No abnormality visualized. Pancreas: Visualized portion unremarkable. Spleen: Size and appearance within normal limits. Right Kidney: Length: 11.5 cm. Echogenicity within normal limits. No solid mass or hydronephrosis visualized. 1.0 cm cyst identified in the upper pole. Left Kidney: Length: 11.5 cm. Echogenicity within normal limits. No Solid mass or hydronephrosis visualized. 1.7 cm cyst upper pole. Abdominal aorta: Limited visualization, up to 2.2 mm cm in diameter. Other findings: Limited visualization of several structures due to body habitus and bowel gas. IMPRESSION: No acute findings.  No cause seen for epigastric pain. Electronically Signed   By: Staci Righter M.D.   On: 04/22/2016 18:00      Subjective: Occasional episodes of deep breaths which she describes as dyspnea, mostly occurs early mornings on waking up. Denies nasal stuffiness or congestion. As per spouse, no history of significant snoring or apneic spells. Overall feels much better compared to initial admission. Anxious to return home. No chest pain reported. No dizziness or lightheaded. Leg swelling have resolved.  Discharge Exam:  Vitals:   04/25/16 2021 04/26/16 0517 04/26/16 0800 04/26/16 1242  BP: 116/72 128/67 120/66 115/63  Pulse: 95 92 99 92  Resp: 16 18 16 18   Temp: 97.9 F (36.6 C) 97.5 F (36.4 C) 98.1 F (36.7 C) 98.3 F (36.8 C)  TempSrc:  Oral Oral Oral  SpO2: 95% 98% 92% 96%  Weight:  90.5 kg (199 lb 9.6 oz)    Height:        General exam: Pleasant elderly male lying comfortably supine in bed. Respiratory system: clear to auscultation. Respiratory effort  normal. Cardiovascular system: S1 & S2 heard, RRR. No JVD, murmurs, rubs, gallops or clicks. No pedal edema. Telemetry: Sinus rhythm, BBB morphology. Wide complex nonsustained tachycardia noted on 04/26/16 at 3:51 AM.? NSVT Vs NSSVT. Gastrointestinal system: Abdomen is nondistended, soft and nontender. No organomegaly or masses felt. Normal bowel sounds heard. Central nervous system: Alert and oriented. No focal neurological deficits. Extremities: Symmetric 5 x 5 power. Skin: No rashes, lesions or ulcers Psychiatry: Judgement and insight appear normal. Mood & affect appropriate.     The results of significant diagnostics from this hospitalization (including imaging, microbiology, ancillary and laboratory) are listed below for reference.     Microbiology: No results found for this or any previous visit (from the past 240 hour(s)).   Labs: BNP (last 3 results)  Recent Labs  04/22/16 1325  BNP [id number]*   Basic Metabolic Panel:  Recent Labs Lab 04/23/16 0915 04/24/16 0355 04/25/16 0544 04/25/16 1957 04/26/16 0333  NA 139 137 138 138 136  K 3.7 3.2* 3.6 4.0 4.0  CL 100* 97* 100* 102 102  CO2 29 29 25 27 26   GLUCOSE 154* 140* 163* 191* 146*  BUN 7 9 12 8 6   CREATININE 0.73 0.87 0.67 0.64 0.65  CALCIUM 8.8* 8.8* 9.8 8.9 8.8*  MG  --  1.6*  --  2.4 2.3   Liver Function Tests:  Recent Labs Lab 04/22/16 1322  AST 24  ALT 12*  ALKPHOS 75  BILITOT 0.7  PROT 7.1  ALBUMIN 3.6    Recent Labs Lab 04/22/16 1322  LIPASE 25   CBC:  Recent Labs Lab 04/22/16 1322 04/25/16 0544  WBC 5.8 7.8  HGB 12.3* 12.5*  HCT 36.3* 38.6*  MCV 87.1 87.3  PLT 232 249   Cardiac Enzymes:  Recent Labs Lab 04/22/16 1950 04/23/16 0218 04/23/16 0915  TROPONINI 0.03* <0.03 <0.03   CBG:  Recent Labs Lab 04/25/16 1720 04/25/16 1827 04/25/16 2150 04/26/16 1055 04/26/16 1614  GLUCAP 159* 139* 214* 169* 146*   Discussed in detail with patient's spouse at bedside. Updated care  and answered questions.   Time coordinating discharge: Over 30 minutes  SIGNED:  Vernell Leep, MD, FACP, [place]. [facility] Pager [phone]  If 7PM-7AM, please contact night-coverage [email or web address] Password [facility] 04/26/2016, 5:19 PM

## 2016-04-26 NOTE — Progress Notes (Signed)
Patient Name: James Parsons Date of Encounter: 04/26/2016  Primary Cardiologist: New, Dr. [facility] Problem List     Principal Problem:   Acute exacerbation of CHF (congestive heart failure) ([place]) Active Problems:   Mixed hyperlipidemia   Essential hypertension   Venous (peripheral) insufficiency   COPD (chronic obstructive pulmonary disease) (HCC)   T2_NIDDM   Chronic pain associated with significant psychosocial dysfunction   Spinal cord stimulator status   RSD (reflex sympathetic dystrophy)   Atypical chest pain   Acute systolic CHF (congestive heart failure) (HCC)   Hypokalemia   Acute pulmonary edema (HCC)     Subjective   Feels well, but says he had PND last night.   Inpatient Medications    Scheduled Meds: . aspirin EC  81 mg Oral Daily  . atorvastatin  40 mg Oral q1800  . cholecalciferol  5,000 Units Oral BID  . cloNIDine  0.1 mg Oral Daily  . docusate sodium  100 mg Oral BID  . DULoxetine  60 mg Oral Daily  . enoxaparin (LOVENOX) injection  40 mg Subcutaneous Q24H  . famotidine  40 mg Oral QHS  . folic acid  1 mg Oral Daily  . insulin aspart  0-9 Units Subcutaneous TID WC  . lisinopril  2.5 mg Oral Daily  . mouth rinse  15 mL Mouth Rinse BID  . metoCLOPramide  10 mg Oral TID AC  . pantoprazole  40 mg Oral BID AC  . potassium chloride  40 mEq Oral Daily  . pregabalin  150 mg Oral BID  . sodium chloride flush  3 mL Intravenous Q12H  . sodium chloride flush  3 mL Intravenous Q12H  . sucralfate  1 g Oral QID  . tamsulosin  0.4 mg Oral QPC supper   Continuous Infusions:  PRN Meds: sodium chloride, acetaminophen **OR** acetaminophen, albuterol, cyclobenzaprine, hydrALAZINE, sodium chloride flush, traMADol   Vital Signs    Vitals:   04/25/16 2021 04/26/16 0517 04/26/16 0800 04/26/16 1242  BP: 116/72 128/67 120/66 115/63  Pulse: 95 92 99 92  Resp: 16 18  18   Temp: 97.9 F (36.6 C) 97.5 F (36.4 C) 98.1 F (36.7 C) 98.3 F (36.8 C)    TempSrc:  Oral Oral Oral  SpO2: 95% 98% 92% 96%  Weight:  199 lb 9.6 oz (90.5 kg)    Height:        Intake/Output Summary (Last 24 hours) at 04/26/16 1402 Last data filed at 04/26/16 1344  Gross per 24 hour  Intake              826 ml  Output              750 ml  Net               76 ml   Filed Weights   04/25/16 0500 04/25/16 0657 04/26/16 0517  Weight: 203 lb 7.8 oz (92.3 kg) 201 lb 11.2 oz (91.5 kg) 199 lb 9.6 oz (90.5 kg)    Physical Exam   GEN: Well nourished, well developed, in no acute distress.  HEENT: Grossly normal.  Neck: Supple, no JVD, carotid bruits, or masses. Cardiac: RRR, no murmurs, rubs, or gallops. No clubbing, cyanosis, edema.  Radials/DP/PT 2+ and equal bilaterally.  Respiratory:  Respirations regular and unlabored, clear to auscultation bilaterally. GI: Soft, nontender, distended, BS + x 4. MS: no deformity or atrophy. Skin: warm and dry, no rash. Neuro:  Strength and sensation are  intact. Psych: AAOx3.  Normal affect.  Labs    CBC  Recent Labs  04/25/16 0544  WBC 7.8  HGB 12.5*  HCT 38.6*  MCV 87.3  PLT 0000000   Basic Metabolic Panel  Recent Labs  04/25/16 1957 04/26/16 0333  NA 138 136  K 4.0 4.0  CL 102 102  CO2 27 26  GLUCOSE 191* 146*  BUN 8 6  CREATININE 0.64 0.65  CALCIUM 8.9 8.8*  MG 2.4 2.3     Telemetry     NSR- Personally Reviewed  ECG     Sinus tach, LBBB - Personally Reviewed  Radiology    No results found.  Cardiac Studies  Right/Left Heart Cath and Coronary Angiography 04/25/16  There is moderate to severe left ventricular systolic dysfunction.  LV end diastolic pressure is normal.  The left ventricular ejection fraction is 25-35% by visual estimate.  Mid RCA lesion, 40 %stenosed.  Ost Ramus to Ramus lesion, 60 %stenosed.  Ost LAD to Prox LAD lesion, 30 %stenosed.  Prox LAD to Mid LAD lesion, 20 %stenosed.  Hemodynamic findings consistent with mild pulmonary hypertension.  LV end diastolic  pressure is normal.   1. Nonobstructive CAD 2. Moderate to severe LV dysfunction with global hypokinesis. EF 35% 3. Borderline pulmonary HTN 4. Normal LV filling pressures.  Plan: medical management  Transthoracic Echocardiography 04/23/16 Study Conclusions  - Left ventricle: The cavity size was normal. There was moderate   concentric hypertrophy. Systolic function was mildly to   moderately reduced. The estimated ejection fraction was in the   range of 40% to 45%. Diffuse hypokinesis. Doppler parameters are   consistent with abnormal left ventricular relaxation (grade 1   diastolic dysfunction). Doppler parameters are consistent with   elevated ventricular end-diastolic filling pressure. - Ventricular septum: Septal motion showed paradox. - Aortic valve: Trileaflet; mildly thickened, mildly calcified   leaflets. There was mild stenosis. Mean gradient (S): 8 mm Hg.   Peak gradient (S): 19 mm Hg. Valve area (VTI): 2.49 cm^2. Valve   area (Vmax): 2.19 cm^2. Valve area (Vmean): 2.31 cm^2. - Mitral valve: There was mild regurgitation. - Left atrium: The atrium was mildly dilated. - Right ventricle: Systolic function was normal. - Pulmonary arteries: Systolic pressure was within the normal   range.  Impressions:  - There is moderate concentric LVH with diffuse hypokinesis and   paradoxical septal motion consistent with LBBB. LVEF 40-45%.   Filling pressures are elevated.    Patient Profile     71 y.o.malehistory of DM2, HTN, COPD, GERD, HL, chronic back pain, chronic LBBB admitted with dyspnea and chest pain.  No obstructive CAD by cath.   Assessment & Plan    1. Acute on chronic combined systolic and diastolic CHF: Negative 5 L, weight only down 3 pounds in 4 days? Would send home on 40mg  daily of Lasix.   Will add Coreg 3.125mg  BID. Continue ACE-I.   2. HTN: DC clonidine. Add Coreg 3.125mg  BID.   3. Nonischemic cardiomyopathy.   Signed, Arbutus Leas, NP    04/26/2016, 2:02 PM   Agree with note by Jettie Booze NP  Good diuresis 5.2 L. BNP was low initially. S/P R/L heart cath yesterday revealing min CAD with elevated R heart pressures, Nl filling pressures. Will start PO diuretics. Start Coreg and titrate as OP. Would re check CXR since there is decreased BS at the bases and pleural effusions on initial CXR. Will see again as OP.  [name],  M.D., [facility] [place], [place], [place] [address] [place], [place]  [id number]  [phone] 04/26/2016 2:50 PM

## 2016-04-26 NOTE — Progress Notes (Signed)
Patient given discharge instructions and all questions answered.  Pt. Discharged via wheelchair with all belongings.   

## 2016-04-26 NOTE — Progress Notes (Signed)
Pt had episodes of wide qrs complexes. Pt denies any chest pains. noted left lead off.reconnected leads.pt new on SR.

## 2016-04-26 NOTE — Discharge Instructions (Signed)
Heart Failure °Heart failure is a condition in which the heart has trouble pumping blood because it has become weak or stiff. This means that the heart does not pump blood efficiently for the body to work well. For some people with heart failure, fluid may back up into the lungs and there may be swelling (edema) in the lower legs. Heart failure is usually a long-term (chronic) condition. It is important for you to take good care of yourself and follow the treatment plan from your health care provider. °What are the causes? °This condition is caused by some health problems, including: °· High blood pressure (hypertension). Hypertension causes the heart muscle to work harder than normal. High blood pressure eventually causes the heart to become stiff and weak. °· Coronary artery disease (CAD). CAD is the buildup of cholesterol and fat (plaques) in the arteries of the heart. °· Heart attack (myocardial infarction). Injured tissue, which is caused by the heart attack, does not contract as well and the heart's ability to pump blood is weakened. °· Abnormal heart valves. When the heart valves do not open and close properly, the heart muscle must pump harder to keep the blood flowing. °· Heart muscle disease (cardiomyopathy or myocarditis). Heart muscle disease is damage to the heart muscle from a variety of causes, such as drug or alcohol abuse, infections, or unknown causes. These can increase the risk of heart failure. °· Lung disease. When the lungs do not work properly, the heart must work harder. °What increases the risk? °Risk of heart failure increases as a person ages. This condition is also more likely to develop in people who: °· Are overweight. °· Are male. °· Smoke or chew tobacco. °· Abuse alcohol or illegal drugs. °· Have taken medicines that can damage the heart, such as chemotherapy drugs. °· Have diabetes. °¨ High blood sugar (glucose) is associated with high fat (lipid) levels in the blood. °¨ Diabetes  can also damage tiny blood vessels that carry nutrients to the heart muscle. °· Have abnormal heart rhythms. °· Have thyroid problems. °· Have low blood counts (anemia). °What are the signs or symptoms? °Symptoms of this condition include: °· Shortness of breath with activity, such as when climbing stairs. °· Persistent cough. °· Swelling of the feet, ankles, legs, or abdomen. °· Unexplained weight gain. °· Difficulty breathing when lying flat (orthopnea). °· Waking from sleep because of the need to sit up and get more air. °· Rapid heartbeat. °· Fatigue and loss of energy. °· Feeling light-headed, dizzy, or close to fainting. °· Loss of appetite. °· Nausea. °· Increased urination during the night (nocturia). °· Confusion. °How is this diagnosed? °This condition is diagnosed based on: °· Medical history, symptoms, and a physical exam. °· Diagnostic tests, which may include: °¨ Echocardiogram. °¨ Electrocardiogram (ECG). °¨ Chest X-ray. °¨ Blood tests. °¨ Exercise stress test. °¨ Radionuclide scans. °¨ Cardiac catheterization and angiogram. °How is this treated? °Treatment for this condition is aimed at managing the symptoms of heart failure. Medicines, behavioral changes, or other treatments may be necessary to treat heart failure. °Medicines  °These may include: °· Angiotensin-converting enzyme (ACE) inhibitors. This type of medicine blocks the effects of a blood protein called angiotensin-converting enzyme. ACE inhibitors relax (dilate) the blood vessels and help to lower blood pressure. °· Angiotensin receptor blockers (ARBs). This type of medicine blocks the actions of a blood protein called angiotensin. ARBs dilate the blood vessels and help to lower blood pressure. °· Water pills (diuretics). Diuretics   cause the kidneys to remove salt and water from the blood. The extra fluid is removed through urination, leaving a lower volume of blood that the heart has to pump.  Beta blockers. These improve heart muscle  strength and they prevent the heart from beating too quickly.  Digoxin. This increases the force of the heartbeat. Healthy behavior changes  These may include:  Reaching and maintaining a healthy weight.  Stopping smoking or chewing tobacco.  Eating heart-healthy foods.  Limiting or avoiding alcohol.  Stopping use of street drugs (illegal drugs).  Physical activity. Other treatments  These may include:  Surgery to open blocked coronary arteries or repair damaged heart valves.  Placement of a biventricular pacemaker to improve heart muscle function (cardiac resynchronization therapy). This device paces both the right ventricle and left ventricle.  Placement of a device to treat serious abnormal heart rhythms (implantable cardioverter defibrillator, or ICD).  Placement of a device to improve the pumping ability of the heart (left ventricular assist device, or LVAD).  Heart transplant. This can cure heart failure, and it is considered for certain patients who do not improve with other therapies. Follow these instructions at home: Medicines  Take over-the-counter and prescription medicines only as told by your health care provider. Medicines are important in reducing the workload of your heart, slowing the progression of heart failure, and improving your symptoms.  Do not stop taking your medicine unless your health care provider told you to do that.  Do not skip any dose of medicine.  Refill your prescriptions before you run out of medicine. You need your medicines every day. Eating and drinking  Eat heart-healthy foods. Talk with a dietitian to make an eating plan that is right for you.  Choose foods that contain no trans fat and are low in saturated fat and cholesterol. Healthy choices include fresh or frozen fruits and vegetables, fish, lean meats, legumes, fat-free or low-fat dairy products, and whole-grain or high-fiber foods.  Limit salt (sodium) if directed by your  health care provider. Sodium restriction may reduce symptoms of heart failure. Ask a dietitian to recommend heart-healthy seasonings.  Use healthy cooking methods instead of frying. Healthy methods include roasting, grilling, broiling, baking, poaching, steaming, and stir-frying.  Limit your fluid intake if directed by your health care provider. Fluid restriction may reduce symptoms of heart failure. Lifestyle  Stop smoking or using chewing tobacco. Nicotine and tobacco can damage your heart and your blood vessels. Do not use nicotine gum or patches before talking to your health care provider.  Limit alcohol intake to no more than 1 drink per day for non-pregnant women and 2 drinks per day for men. One drink equals 12 oz of beer, 5 oz of wine, or 1 oz of hard liquor.  Drinking more than that is harmful to your heart. Tell your health care provider if you drink alcohol several times a week.  Talk with your health care provider about whether any level of alcohol use is safe for you.  If your heart has already been damaged by alcohol or you have severe heart failure, drinking alcohol should be stopped completely.  Stop use of illegal drugs.  Lose weight if directed by your health care provider. Weight loss may reduce symptoms of heart failure.  Do moderate physical activity if directed by your health care provider. People who are elderly and people with severe heart failure should consult with a health care provider for physical activity recommendations. Monitor important information  Weigh  yourself every day. Keeping track of your weight daily helps you to notice excess fluid sooner.  Weigh yourself every morning after you urinate and before you eat breakfast.  Wear the same amount of clothing each time you weigh yourself.  Record your daily weight. Provide your health care provider with your weight record.  Monitor and record your blood pressure as told by your health care  provider.  Check your pulse as told by your health care provider. Dealing with extreme temperatures  If the weather is extremely hot:  Avoid vigorous physical activity.  Use air conditioning or fans or seek a cooler location.  Avoid caffeine and alcohol.  Wear loose-fitting, lightweight, and light-colored clothing.  If the weather is extremely cold:  Avoid vigorous physical activity.  Layer your clothes.  Wear mittens or gloves, a hat, and a scarf when you go outside.  Avoid alcohol. General instructions  Manage other health conditions such as hypertension, diabetes, thyroid disease, or abnormal heart rhythms as told by your health care provider.  Learn to manage stress. If you need help to do this, ask your health care provider.  Plan rest periods when fatigued.  Get ongoing education and support as needed.  Participate in or seek rehabilitation as needed to maintain or improve independence and quality of life.  Stay up to date with immunizations. Keeping current on pneumococcal and influenza immunizations is especially important to prevent respiratory infections.  Keep all follow-up visits as told by your health care provider. This is important. Contact a health care provider if:  You have a rapid weight gain.  You have increasing shortness of breath that is unusual for you.  You are unable to participate in your usual physical activities.  You tire easily.  You cough more than normal, especially with physical activity.  You have any swelling or more swelling in areas such as your hands, feet, ankles, or abdomen.  You are unable to sleep because it is hard to breathe.  You feel like your heart is beating quickly (palpitations).  You become dizzy or light-headed when you stand up. Get help right away if:  You have difficulty breathing.  You notice or your family notices a change in your awareness, such as having trouble staying awake or having difficulty  with concentration.   Coronary Artery Disease, Male Coronary artery disease (CAD) is a process in which the blood vessels of the heart (coronary arteries) become narrow or blocked. The narrowing or blockage can lead to decreased blood flow to the heart muscle (angina). Prolonged reduced blood flow can cause a heart attack (myocardial infarction, MI). Because CAD is the leading cause of death in men, it is important to understand what causes this condition and how it is treated. What are the causes? Atherosclerosis is the cause of CAD. This is the buildup of fat and cholesterol (plaque) on the inside of the arteries. Over time, the plaque may narrow or block the artery, and this will lessen blood flow to the heart. Plaque can also become weak and break off within a coronary artery to form a clot and cause a sudden blockage. What increases the risk? Many risk factors increase your chances of getting CAD, including:  High cholesterol levels.  High blood pressure (hypertension).  Tobacco use.  Diabetes.  Age. Men over age 5 are at a greater risk of CAD.  Gender. Men often develop CAD earlier in life than women.  Family history of CAD.  Obesity.  Lack of  exercise.  A diet high in saturated fats. What are the signs or symptoms? Many people do not experience any symptoms during the early stages of CAD. As the condition progresses, symptoms may include:  Chest pain.  The pain can be described as a crushing or squeezing in the chest, or a tightness, pressure, fullness, or heaviness in the chest.  The pain can last more than a few minutes or can stop and recur.  Pain in the arms, neck, jaw, or back.  Unexplained heartburn or indigestion.  Shortness of breath.  Nausea.  Sudden cold sweats. Less common symptoms of CAD in men can include:  Fatigue.  Unexplained feelings of nervousness or anxiety.  Weakness.  Diarrhea.  Sudden light-headedness. How is this  diagnosed? Tests to diagnose CAD may include:  ECG (electrocardiogram).  Exercise stress test. This looks for signs of blockage when the heart is being exercised.  Pharmacologic stress test. This test looks for signs of blockage when the heart is being stressed with a medicine.  Blood tests.  Coronary angiogram. This is a procedure to look at the coronary arteries to see if there is any blockage. How is this treated? The treatment of CAD may include the following:  Healthy behavioral changes to reduce or control risk factors.  Medicine.  Coronary stenting.A stent helps to keep an artery open.  Coronary angioplasty. This procedure widens a narrowed or blocked artery.  Coronary arterybypass surgery. This will allow your blood to pass the blockage (bypass) to reach your heart. Follow these instructions at home:  Take medicines only as directed by your health care provider.  Do not take the following medicines unless your health care provider approves:  Nonsteroidal anti-inflammatory drugs (NSAIDs), such as ibuprofen, naproxen, or celecoxib.  Vitamin supplements that contain vitamin A, vitamin E, or both.  Manage other health conditions such as hypertension and diabetes as directed by your health care provider.  Follow a heart-healthy diet. A dietitian can help to educate you about healthy food options and changes.  Use healthy cooking methods such as roasting, grilling, broiling, baking, poaching, steaming, or stir-frying. Talk to a dietitian to learn more about healthy cooking methods.  Follow an exercise program approved by your health care provider.  Maintain a healthy weight. Lose weight as approved by your health care provider.  Plan rest periods when fatigued.  Learn to manage stress.  Do not use any tobacco products, including cigarettes, chewing tobacco, or electronic cigarettes. If you need help quitting, ask your health care provider.  If you drink alcohol,  and your health care provider approves, limit your alcohol intake to no more than 1 drink per day. One drink equals 12 ounces of beer, 5 ounces of wine, or 1 ounces of hard liquor.  Stop illegal drug use.  Your health care provider may ask you to monitor your blood pressure. A blood pressure reading consists of a higher number over a lower number, such as 110 over 72, which is written as 110/72. Ideally, your blood pressure should be:  Below 140/90 if you have no other medical conditions.  Below 130/80 if you have diabetes or kidney disease.  Keep all follow-up visits as directed by your health care provider. This is important. Get help right away if:  You have pain in your chest, neck, arm, jaw, stomach, or back that lasts more than a few minutes, is recurring, or is unrelieved by taking medicine under your tongue (sublingualnitroglycerin).  You have profuse sweating without cause.  You have unexplained:  Heartburn or indigestion.  Shortness of breath or difficulty breathing.  Nausea or vomiting.  Fatigue.  Feelings of nervousness or anxiety.  Weakness.  Diarrhea.  You have sudden light-headedness or dizziness.  You faint. These symptoms may represent a serious problem that is an emergency. Do not wait to see if the symptoms will go away. Get medical help right away. Call your local emergency services (911 in the [place] Do not drive yourself to the hospital.  This information is not intended to replace advice given to you by your health care provider. Make sure you discuss any questions you have with your health care provider. Document Released: 10/23/2013 Document Revised: 09/03/2015 Document Reviewed: 07/30/2013 Elsevier Interactive Patient Education  2017 [place] have pain or discomfort in your chest.  You have an episode of fainting (syncope). This information is not intended to replace advice given to you by your health care provider. Make sure you  discuss any questions you have with your health care provider. Document Released: 03/28/2005 Document Revised: 12/01/2015 Document Reviewed: 10/21/2015 Elsevier Interactive Patient Education  2017 [place].   Additional discharge instructions:  Please get your medications reviewed and adjusted by your Primary MD.  Please request your Primary MD to go over all Hospital Tests and Procedure/Radiological results at the follow up, please get all Hospital records sent to your Prim MD by signing hospital release before you go home.  If you had Pneumonia of Lung problems at the Hospital: Please get a 2 view Chest X ray done in 6-8 weeks after hospital discharge or sooner if instructed by your Primary MD.  If you have Congestive Heart Failure: Please call your Cardiologist or Primary MD anytime you have any of the following symptoms:  1) 3 pound weight gain in 24 hours or 5 pounds in 1 week  2) shortness of breath, with or without a dry hacking cough  3) swelling in the hands, feet or stomach  4) if you have to sleep on extra pillows at night in order to breathe  Follow cardiac low salt diet and 1.5 lit/day fluid restriction.  If you have diabetes Accuchecks 4 times/day, Once in AM empty stomach and then before each meal. Log in all results and show them to your primary doctor at your next visit. If any glucose reading is under 80 or above 300 call your primary MD immediately.  If you have Seizure/Convulsions/Epilepsy: Please do not drive, operate heavy machinery, participate in activities at heights or participate in high speed sports until you have seen by Primary MD or a Neurologist and advised to do so again.  If you had Gastrointestinal Bleeding: Please ask your Primary MD to check a complete blood count within one week of discharge or at your next visit. Your endoscopic/colonoscopic biopsies that are pending at the time of discharge, will also need to followed by your Primary  MD.  Get Medicines reviewed and adjusted. Please take all your medications with you for your next visit with your Primary MD  Please request your Primary MD to go over all hospital tests and procedure/radiological results at the follow up, please ask your Primary MD to get all Hospital records sent to his/her office.  If you experience worsening of your admission symptoms, develop shortness of breath, life threatening emergency, suicidal or homicidal thoughts you must seek medical attention immediately by calling 911 or calling your MD immediately  if symptoms less severe.  You must read  complete instructions/literature along with all the possible adverse reactions/side effects for all the Medicines you take and that have been prescribed to you. Take any new Medicines after you have completely understood and accpet all the possible adverse reactions/side effects.   Do not drive or operate heavy machinery when taking Pain medications.   Do not take more than prescribed Pain, Sleep and Anxiety Medications  Special Instructions: If you have smoked or chewed Tobacco  in the last 2 yrs please stop smoking, stop any regular Alcohol  and or any Recreational drug use.  Wear Seat belts while driving.  Please note You were cared for by a hospitalist during your hospital stay. If you have any questions about your discharge medications or the care you received while you were in the hospital after you are discharged, you can call the unit and asked to speak with the hospitalist on call if the hospitalist that took care of you is not available. Once you are discharged, your primary care physician will handle any further medical issues. Please note that NO REFILLS for any discharge medications will be authorized once you are discharged, as it is imperative that you return to your primary care physician (or establish a relationship with a primary care physician if you do not have one) for your aftercare needs so  that they can reassess your need for medications and monitor your lab values.  You can reach the hospitalist office at phone [phone] or fax [phone]   If you do not have a primary care physician, you can call [phone] for a physician referral.

## 2016-04-27 ENCOUNTER — Encounter: Payer: Self-pay | Admitting: Internal Medicine

## 2016-04-27 LAB — POCT I-STAT TROPONIN I: TROPONIN I, POC: 0.01 ng/mL (ref 0.00–0.08)

## 2016-04-29 ENCOUNTER — Telehealth: Payer: Self-pay | Admitting: Physician Assistant

## 2016-04-29 ENCOUNTER — Encounter (HOSPITAL_COMMUNITY): Payer: Self-pay | Admitting: Nurse Practitioner

## 2016-04-29 ENCOUNTER — Emergency Department (HOSPITAL_COMMUNITY): Payer: Medicare Other

## 2016-04-29 ENCOUNTER — Observation Stay (HOSPITAL_COMMUNITY)
Admission: EM | Admit: 2016-04-29 | Discharge: 2016-05-01 | Disposition: A | Discharge status: Home / Self Care | Payer: Medicare Other | Attending: Internal Medicine | Admitting: Internal Medicine

## 2016-04-29 DIAGNOSIS — E119 Type 2 diabetes mellitus without complications: Secondary | ICD-10-CM | POA: Diagnosis not present

## 2016-04-29 DIAGNOSIS — G894 Chronic pain syndrome: Secondary | ICD-10-CM | POA: Diagnosis present

## 2016-04-29 DIAGNOSIS — I10 Essential (primary) hypertension: Secondary | ICD-10-CM

## 2016-04-29 DIAGNOSIS — Z7982 Long term (current) use of aspirin: Secondary | ICD-10-CM | POA: Diagnosis not present

## 2016-04-29 DIAGNOSIS — I110 Hypertensive heart disease with heart failure: Secondary | ICD-10-CM | POA: Diagnosis not present

## 2016-04-29 DIAGNOSIS — Z7984 Long term (current) use of oral hypoglycemic drugs: Secondary | ICD-10-CM | POA: Diagnosis not present

## 2016-04-29 DIAGNOSIS — Z79899 Other long term (current) drug therapy: Secondary | ICD-10-CM | POA: Insufficient documentation

## 2016-04-29 DIAGNOSIS — J449 Chronic obstructive pulmonary disease, unspecified: Secondary | ICD-10-CM | POA: Diagnosis not present

## 2016-04-29 DIAGNOSIS — I5042 Chronic combined systolic (congestive) and diastolic (congestive) heart failure: Secondary | ICD-10-CM | POA: Insufficient documentation

## 2016-04-29 DIAGNOSIS — Z87891 Personal history of nicotine dependence: Secondary | ICD-10-CM | POA: Insufficient documentation

## 2016-04-29 DIAGNOSIS — R55 Syncope and collapse: Principal | ICD-10-CM | POA: Insufficient documentation

## 2016-04-29 DIAGNOSIS — I5043 Acute on chronic combined systolic (congestive) and diastolic (congestive) heart failure: Secondary | ICD-10-CM | POA: Diagnosis present

## 2016-04-29 DIAGNOSIS — D649 Anemia, unspecified: Secondary | ICD-10-CM | POA: Diagnosis present

## 2016-04-29 DIAGNOSIS — Z96651 Presence of right artificial knee joint: Secondary | ICD-10-CM | POA: Insufficient documentation

## 2016-04-29 DIAGNOSIS — I428 Other cardiomyopathies: Secondary | ICD-10-CM

## 2016-04-29 LAB — CBC
HEMATOCRIT: 34.1 % — AB (ref 39.0–52.0)
Hemoglobin: 11.1 g/dL — ABNORMAL LOW (ref 13.0–17.0)
MCH: 28.2 pg (ref 26.0–34.0)
MCHC: 32.6 g/dL (ref 30.0–36.0)
MCV: 86.8 fL (ref 78.0–100.0)
Platelets: 237 10*3/uL (ref 150–400)
RBC: 3.93 MIL/uL — ABNORMAL LOW (ref 4.22–5.81)
RDW: 15.1 % (ref 11.5–15.5)
WBC: 5.1 10*3/uL (ref 4.0–10.5)

## 2016-04-29 LAB — BASIC METABOLIC PANEL
Anion gap: 8 (ref 5–15)
BUN: 15 mg/dL (ref 6–20)
CO2: 25 mmol/L (ref 22–32)
CREATININE: 0.94 mg/dL (ref 0.61–1.24)
Calcium: 8.7 mg/dL — ABNORMAL LOW (ref 8.9–10.3)
Chloride: 101 mmol/L (ref 101–111)
GFR calc Af Amer: >60 mL/min (ref >60)
GLUCOSE: 126 mg/dL — AB (ref 65–99)
Potassium: 4 mmol/L (ref 3.5–5.1)
Sodium: 134 mmol/L — ABNORMAL LOW (ref 135–145)

## 2016-04-29 LAB — URINALYSIS, ROUTINE W REFLEX MICROSCOPIC
BILIRUBIN URINE: NEGATIVE
GLUCOSE, UA: NEGATIVE mg/dL
Hgb urine dipstick: NEGATIVE
Ketones, ur: NEGATIVE mg/dL
Leukocytes, UA: NEGATIVE
NITRITE: NEGATIVE
PH: 6 (ref 5.0–8.0)
Protein, ur: NEGATIVE mg/dL
Specific Gravity, Urine: 1.014 (ref 1.005–1.030)

## 2016-04-29 LAB — CBG MONITORING, ED: Glucose-Capillary: 97 mg/dL (ref 65–99)

## 2016-04-29 LAB — GLUCOSE, CAPILLARY
GLUCOSE-CAPILLARY: 97 mg/dL (ref 65–99)
GLUCOSE-CAPILLARY: 175 mg/dL — AB (ref 65–99)

## 2016-04-29 MED ORDER — DULOXETINE HCL 60 MG PO CPEP
60.0000 mg | ORAL_CAPSULE | Freq: Every day | ORAL | Status: DC
  Administered 2016-04-29 – 2016-05-01 (×3): 60 mg via ORAL
  Filled 2016-04-29 (×3): qty 1

## 2016-04-29 MED ORDER — OXYCODONE-ACETAMINOPHEN 5-325 MG PO TABS: 1.0000 | ORAL_TABLET | Freq: Three times a day (TID) | ORAL | Status: DC | PRN

## 2016-04-29 MED ORDER — METOCLOPRAMIDE HCL 10 MG PO TABS
10.0000 mg | ORAL_TABLET | Freq: Three times a day (TID) | ORAL | Status: DC
  Administered 2016-04-30 – 2016-05-01 (×4): 10 mg via ORAL
  Filled 2016-04-29 (×4): qty 1

## 2016-04-29 MED ORDER — ACETAMINOPHEN 325 MG PO TABS: 650.0000 mg | ORAL_TABLET | Freq: Four times a day (QID) | ORAL | Status: DC | PRN

## 2016-04-29 MED ORDER — PANTOPRAZOLE SODIUM 40 MG PO TBEC
40.0000 mg | DELAYED_RELEASE_TABLET | Freq: Two times a day (BID) | ORAL | Status: DC
  Administered 2016-04-30 – 2016-05-01 (×3): 40 mg via ORAL
  Filled 2016-04-29 (×3): qty 1

## 2016-04-29 MED ORDER — TRAMADOL HCL 50 MG PO TABS: 50.0000 mg | ORAL_TABLET | Freq: Two times a day (BID) | ORAL | Status: DC | PRN

## 2016-04-29 MED ORDER — ENOXAPARIN SODIUM 40 MG/0.4ML ~~LOC~~ SOLN
40.0000 mg | SUBCUTANEOUS | Status: DC
  Administered 2016-04-29 – 2016-04-30 (×2): 40 mg via SUBCUTANEOUS
  Filled 2016-04-29 (×3): qty 0.4

## 2016-04-29 MED ORDER — OXYCODONE HCL 5 MG PO TABS: 2.5000 mg | ORAL_TABLET | Freq: Three times a day (TID) | ORAL | Status: DC | PRN

## 2016-04-29 MED ORDER — CARVEDILOL 3.125 MG PO TABS
3.1250 mg | ORAL_TABLET | Freq: Two times a day (BID) | ORAL | Status: DC
  Administered 2016-04-29 – 2016-05-01 (×4): 3.125 mg via ORAL
  Filled 2016-04-29 (×4): qty 1

## 2016-04-29 MED ORDER — TAMSULOSIN HCL 0.4 MG PO CAPS
0.4000 mg | ORAL_CAPSULE | Freq: Every day | ORAL | Status: DC
  Administered 2016-04-29 – 2016-04-30 (×2): 0.4 mg via ORAL
  Filled 2016-04-29 (×2): qty 1

## 2016-04-29 MED ORDER — ONDANSETRON HCL 4 MG PO TABS: 4.0000 mg | ORAL_TABLET | Freq: Four times a day (QID) | ORAL | Status: DC | PRN

## 2016-04-29 MED ORDER — ACETAMINOPHEN 650 MG RE SUPP: 650.0000 mg | Freq: Four times a day (QID) | RECTAL | Status: DC | PRN

## 2016-04-29 MED ORDER — DOCUSATE SODIUM 100 MG PO CAPS
100.0000 mg | ORAL_CAPSULE | Freq: Two times a day (BID) | ORAL | Status: DC
  Administered 2016-04-29 – 2016-05-01 (×3): 100 mg via ORAL
  Filled 2016-04-29 (×4): qty 1

## 2016-04-29 MED ORDER — FAMOTIDINE 20 MG PO TABS
10.0000 mg | ORAL_TABLET | Freq: Every day | ORAL | Status: DC
  Administered 2016-04-30 – 2016-05-01 (×2): 10 mg via ORAL
  Filled 2016-04-29 (×2): qty 1

## 2016-04-29 MED ORDER — ONDANSETRON HCL 4 MG/2ML IJ SOLN: 4.0000 mg | Freq: Four times a day (QID) | INTRAMUSCULAR | Status: DC | PRN

## 2016-04-29 MED ORDER — SODIUM CHLORIDE 0.9% FLUSH
3.0000 mL | Freq: Two times a day (BID) | INTRAVENOUS | Status: DC
  Administered 2016-04-30: 3 mL via INTRAVENOUS

## 2016-04-29 MED ORDER — INSULIN ASPART 100 UNIT/ML ~~LOC~~ SOLN
0.0000 [IU] | Freq: Three times a day (TID) | SUBCUTANEOUS | Status: DC
  Administered 2016-04-30 – 2016-05-01 (×4): 2 [IU] via SUBCUTANEOUS

## 2016-04-29 MED ORDER — ASPIRIN EC 81 MG PO TBEC
81.0000 mg | DELAYED_RELEASE_TABLET | Freq: Every day | ORAL | Status: DC
  Administered 2016-04-30 – 2016-05-01 (×3): 81 mg via ORAL
  Filled 2016-04-29 (×3): qty 1

## 2016-04-29 MED ORDER — FOLIC ACID 1 MG PO TABS
1.0000 mg | ORAL_TABLET | Freq: Every day | ORAL | Status: DC
  Administered 2016-04-30 – 2016-05-01 (×2): 1 mg via ORAL
  Filled 2016-04-29 (×2): qty 1

## 2016-04-29 MED ORDER — LISINOPRIL 2.5 MG PO TABS: 2.5000 mg | ORAL_TABLET | Freq: Every day | ORAL | Status: DC

## 2016-04-29 MED ORDER — PREGABALIN 75 MG PO CAPS
150.0000 mg | ORAL_CAPSULE | Freq: Two times a day (BID) | ORAL | Status: DC
  Administered 2016-04-29 – 2016-05-01 (×4): 150 mg via ORAL
  Filled 2016-04-29 (×4): qty 2

## 2016-04-29 MED ORDER — ATORVASTATIN CALCIUM 80 MG PO TABS
80.0000 mg | ORAL_TABLET | Freq: Every day | ORAL | Status: DC
  Administered 2016-04-29 – 2016-04-30 (×2): 80 mg via ORAL
  Filled 2016-04-29 (×2): qty 1

## 2016-04-29 MED ORDER — OXYCODONE-ACETAMINOPHEN 7.5-325 MG PO TABS
1.0000 | ORAL_TABLET | Freq: Three times a day (TID) | ORAL | Status: DC | PRN
  Administered 2016-04-29: 1 via ORAL
  Filled 2016-04-29: qty 1

## 2016-04-29 MED ORDER — SODIUM CHLORIDE 0.9 % IV SOLN
INTRAVENOUS | Status: AC
  Administered 2016-04-29: 50 mL/h via INTRAVENOUS

## 2016-04-29 MED ORDER — INSULIN ASPART 100 UNIT/ML ~~LOC~~ SOLN: 0.0000 [IU] | Freq: Every day | SUBCUTANEOUS | Status: DC

## 2016-04-29 MED ORDER — SUCRALFATE 1 G PO TABS
1.0000 g | ORAL_TABLET | Freq: Four times a day (QID) | ORAL | Status: DC
  Administered 2016-04-29 – 2016-05-01 (×7): 1 g via ORAL
  Filled 2016-04-29 (×7): qty 1

## 2016-04-29 NOTE — ED Provider Notes (Signed)
[facility] DEPT Provider Note   CSN: [id number] Arrival date & time: 04/29/16  1255     History   Chief Complaint Chief Complaint  Patient presents with  . Loss of Consciousness    HPI James Parsons is a 71 y.o. male.  HPI Patient presents to the emergency department withTake be that occurred this morning.  The patient states that he is unaware of how he ended up in the floor the patient states that he had no recollection of even getting out of bed last night.  Patient states that he was started on new medications or CHF.  He states he has not had any significant problems since he was discharged from the hospital.  The patient states that nothing seems make her condition better or worse. The patient denies chest pain, shortness of breath, headache,blurred vision, neck pain, fever, cough, weakness, numbness, dizziness, anorexia, edema, abdominal pain, nausea, vomiting, diarrhea, rash, back pain, dysuria, hematemesis, bloody stool.  Past Medical History:  Diagnosis Date  . Allergy   . Anemia   . Arthritis   . Borderline hypertension   . Clotting disorder (HCC)    bleeds easily-no dx  . COPD (chronic obstructive pulmonary disease) ([place])    denies  . Depression    hx of   . Diabetes mellitus   . Diverticulosis of colon   . Full dentures   . GERD (gastroesophageal reflux disease)   . Hx of colonic polyps   . Hyperlipidemia   . Hypertension   . Lipoma   . Low back pain   . Mental disorder   . Obesity   . Pneumonia    hx  . RSD (reflex sympathetic dystrophy)   . Venous insufficiency     Patient Active Problem List   Diagnosis Date Noted  . Acute pulmonary edema (HCC)   . Hypokalemia   . Acute systolic CHF (congestive heart failure) ([place]) 04/23/2016  . Acute exacerbation of CHF (congestive heart failure) ([place]) 04/22/2016  . Atypical chest pain 04/22/2016  . Medicare annual wellness visit, subsequent 03/10/2015  . BMI 32.0-32.9,adult 03/10/2015  . RSD (reflex  sympathetic dystrophy) 04/07/2014  . Obesity 04/07/2014  . Spinal cord stimulator status 03/31/2014  . Chronic pain associated with significant psychosocial dysfunction 10/24/2013  . Presence of other specified functional implants 10/24/2013  . Vitamin D deficiency 09/18/2013  . Medication management 09/18/2013  . T2_NIDDM 11/02/2012  . Anemia 11/02/2012  . 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 Spinal stenosis, multilevel 05/17/2012  . Cervical pain 05/17/2012  . DJD (degenerative joint disease) 04/24/2012  . Essential hypertension 03/20/2010  . COPD (chronic obstructive pulmonary disease) ([place]) 03/09/2009  . Venous (peripheral) insufficiency 09/12/2008  . Diverticulosis of large intestine 09/12/2008  . COLONIC POLYPS 02/22/2007  . Mixed hyperlipidemia 02/22/2007  . LOW BACK PAIN SYNDROME 02/22/2007    Past Surgical History:  Procedure Laterality Date  . BACK SURGERY  2005,2007  . C3-4 anterior cervical discectomy and fusion with plating at c3-4  05/2006   Dr. Arnoldo Morale  . CARDIAC CATHETERIZATION N/A 04/25/2016   Procedure: Right/Left Heart Cath and Coronary Angiography;  Surgeon: Peter M Martinique, MD;  Location: [facility];  Service: Cardiovascular;  Laterality: N/A;  . COLONOSCOPY    . COLONOSCOPY W/ POLYPECTOMY    . excision of lipoma from right olecranon area  11/2000   Dr. Rise [name]  . KNEE ARTHROSCOPY     right knee  . KNEE ARTHROSCOPY Right 06/06/2014  Procedure: ARTHROSCOPY RIGHT KNEE WITH REMOVAL OF FIBROUS BANDS;  Surgeon: Kerin Salen, MD;  Location: [facility];  Service: Orthopedics;  Laterality: Right;  . LUMBAR LAMINECTOMY/DECOMPRESSION MICRODISCECTOMY N/A 01/08/2014   Procedure: L4-S1 Decompression with removal and reimplantation of spinal cord stimulator battery ;  Surgeon: Melina Schools, MD;  Location: [facility];  Service: Orthopedics;  Laterality: N/A;  . microdiscectomy and decompression  05/2002   Dr. Tonita Cong  . morphine pump  2009    due to reaction to morphine/changed to fentanyl  . PAIN PUMP IMPLANTATION     with fentanyl  . spinal cord stimulator implanted     for pain per Dr. Maryruth Eve  . subcut pain pump implanted    . TONSILLECTOMY    . TOTAL KNEE ARTHROPLASTY  02/29/2012   Procedure: TOTAL KNEE ARTHROPLASTY;  Surgeon: Kerin Salen, MD;  Location: [facility];  Service: Orthopedics;  Laterality: Right;       Home Medications    Prior to Admission medications   Medication Sig Start Date End Date Taking? Authorizing Provider  aspirin EC 81 MG EC tablet Take 1 tablet (81 mg total) by mouth daily. 04/27/16  Yes Modena Jansky, MD  atorvastatin (LIPITOR) 80 MG tablet Take 1/2 to 1 tablet daily for cholesterol as directed 03/29/16 09/27/16 Yes Unk Pinto, MD  carvedilol (COREG) 3.125 MG tablet Take 1 tablet (3.125 mg total) by mouth 2 (two) times daily with a meal. 04/27/16  Yes Modena Jansky, MD  Cholecalciferol (VITAMIN D-3) 5000 UNITS TABS Take 5,000 Units by mouth 2 (two) times daily.   Yes Historical Provider, MD  cyclobenzaprine (FLEXERIL) 10 MG tablet Take 10 mg by mouth 3 (three) times daily as needed for muscle spasms. Muscle spasm. 03/02/11  Yes Historical Provider, MD  docusate sodium 100 MG CAPS Take 100 mg by mouth 2 (two) times daily. 04/09/14  Yes Shanker Kristeen Mans, MD  DULoxetine (CYMBALTA) 60 MG capsule Take 60 mg by mouth daily.  10/09/12  Yes Historical Provider, MD  folic acid (FOLVITE) 1 MG tablet Take 1 mg by mouth daily. 08/13/14  Yes Historical Provider, MD  furosemide (LASIX) 40 MG tablet Take 1 tablet (40 mg total) by mouth daily. 04/26/16  Yes Modena Jansky, MD  glimepiride (AMARYL) 4 MG tablet TAKE 1 TABLET TWICE DAILY WITH MEALS. DO NOT TAKE IF YOU DO NOT EAT. 12/26/15  Yes Unk Pinto, MD  lidocaine (LIDODERM) 5 % Place 1 patch onto the skin daily as needed (pain). 04/26/16  Yes Modena Jansky, MD  lisinopril (PRINIVIL,ZESTRIL) 2.5 MG tablet Take 1 tablet (2.5 mg total) by mouth daily.  04/27/16  Yes Modena Jansky, MD  LYRICA 150 MG capsule Take 150 mg by mouth 2 (two) times daily.  02/21/11  Yes Historical Provider, MD  metFORMIN (GLUCOPHAGE) 500 MG tablet Take 2 tablets (1,000 mg total) by mouth 2 (two) times daily. Currently held due to contrast your received for heart cath. May restart taking it on 04/28/16. 04/28/16  Yes Modena Jansky, MD  methotrexate (RHEUMATREX) 2.5 MG tablet Take 15 mg by mouth once a week. On Friday, 6 tablets 08/13/14  Yes Historical Provider, MD  metoCLOPramide (REGLAN) 10 MG tablet Take 1 tablet (10 mg total) by mouth 3 (three) times daily before meals. 03/17/16  Yes Courtney Forcucci, PA-C  oxyCODONE-acetaminophen (PERCOCET) 10-325 MG tablet Take 1 tablet by mouth every 8 (eight) hours as needed for pain. 04/01/16  Yes Historical Provider, MD  pantoprazole (PROTONIX) 40 MG tablet Take 1 tablet (40 mg total) by mouth 2 (two) times daily before a meal. 03/30/16 03/30/17 Yes Irene Shipper, MD  potassium chloride SA (K-DUR,KLOR-CON) 20 MEQ tablet Take 1 tablet (20 mEq total) by mouth daily. 04/27/16  Yes Modena Jansky, MD  pregabalin (LYRICA) 150 MG capsule Take 150 mg by mouth 2 (two) times daily. 04/07/16 06/06/16 Yes Historical Provider, MD  PRESCRIPTION MEDICATION 0.5 mcg/mL by Pump Prime route continuous. Fentanyl 0.05mg /ml solution 5,000 mcg. Patient will bring dosage instructions with him. Daily dose 0.84mg .   Yes Historical Provider, MD  ranitidine (ZANTAC) 300 MG tablet Take 1 tablet (300 mg total) by mouth at bedtime. 02/22/16 02/21/17 Yes Vicie Mutters, PA-C  sucralfate (CARAFATE) 1 g tablet Take 1 tablet (1 g total) by mouth 4 (four) times daily. 03/17/16 03/17/17 Yes Courtney Forcucci, PA-C  tamsulosin (FLOMAX) 0.4 MG CAPS capsule Take 1 capsule (0.4 mg total) by mouth daily after supper. 08/20/15  Yes Courtney Forcucci, PA-C  traMADol (ULTRAM) 50 MG tablet Take 1 tablet (50 mg total) by mouth every 6 (six) hours as needed for moderate pain. pain  04/09/14  Yes Shanker Kristeen Mans, MD  triamcinolone cream (KENALOG) 0.1 % Apply 1 application topically 4 (four) times daily. 09/08/15  Yes Unk Pinto, MD    Family History Family History  Problem Relation Age of Onset  . Cancer Father   . Colon cancer Neg Hx   . Esophageal cancer Neg Hx   . Stomach cancer Neg Hx   . Rectal cancer Neg Hx     Social History Social History  Substance Use Topics  . Smoking status: Former Smoker    Packs/day: 3.00    Years: 40.00    Types: Cigarettes    Quit date: 05/25/2002  . Smokeless tobacco: Never Used  . Alcohol use No     Allergies   Codeine and Morphine and related   Review of Systems Review of Systems All other systems negative except as documented in the HPI. All pertinent positives and negatives as reviewed in the HPI.  Physical Exam Updated Vital Signs BP 111/70   Pulse 95   Temp 99.1 F (37.3 C) (Oral)   Resp 14   Ht 5\' 9"  (1.753 m)   Wt 87.1 kg   SpO2 95%   BMI 28.35 kg/m   Physical Exam  Constitutional: He is oriented to person, place, and time. He appears well-developed and well-nourished. No distress.  HENT:  Head: Normocephalic and atraumatic.  Mouth/Throat: Oropharynx is clear and moist.  Eyes: Pupils are equal, round, and reactive to light.  Neck: Normal range of motion. Neck supple.  Cardiovascular: Normal rate, regular rhythm and normal heart sounds.  Exam reveals no gallop and no friction rub.   No murmur heard. Pulmonary/Chest: Effort normal and breath sounds normal. No respiratory distress. He has no wheezes.  Abdominal: Soft. Bowel sounds are normal. He exhibits no distension and no mass. There is tenderness. There is no guarding.  Neurological: He is alert and oriented to person, place, and time. He exhibits normal muscle tone. Coordination normal.  Skin: Skin is warm and dry. No rash noted. No erythema.  Psychiatric: He has a normal mood and affect. His behavior is normal.  Nursing note and  vitals reviewed.    ED Treatments / Results  Labs (all labs ordered are listed, but only abnormal results are displayed) Labs Reviewed  BASIC METABOLIC PANEL - Abnormal; Notable for the following:  Result Value   Sodium 134 (*)    Glucose, Bld 126 (*)    Calcium 8.7 (*)    All other components within normal limits  CBC - Abnormal; Notable for the following:    RBC 3.93 (*)    Hemoglobin 11.1 (*)    HCT 34.1 (*)    All other components within normal limits  URINALYSIS, ROUTINE W REFLEX MICROSCOPIC  CBG MONITORING, ED  I-STAT TROPOININ, ED    EKG  EKG Interpretation None       Radiology Ct Head Wo Contrast  Result Date: 04/29/2016 CLINICAL DATA:  Found on floor.  Lethargy with generalized weakness. EXAM: CT HEAD WITHOUT CONTRAST TECHNIQUE: Contiguous axial images were obtained from the base of the skull through the vertex without intravenous contrast. COMPARISON:  03/31/2014 FINDINGS: Brain: There is no evidence of acute cortical infarct, intracranial hemorrhage, mass, midline shift, or extra-axial fluid collection. Mild generalized cerebral atrophy is unchanged. Vascular: Minimal calcified atherosclerosis at the skullbase. No hyperdense vessel. Skull: No fracture or focal osseous lesion. Sinuses/Orbits: Small left mastoid effusion. Paranasal sinuses are clear. Orbits are unremarkable. Other: None. IMPRESSION: No evidence of acute intracranial abnormality. Electronically Signed   By: Logan Bores M.D.   On: 04/29/2016 15:42    Procedures Procedures (including critical care time)  Medications Ordered in ED Medications - No data to display   Initial Impression / Assessment and Plan / ED Course  I have reviewed the triage vital signs and the nursing notes.  Pertinent labs & imaging results that were available during my care of the patient were reviewed by me and considered in my medical decision making (see chart for details).     Patient may have had, over diuresis  from his new medications.  I spoke with the [facility] hospitalist, who will admit the patient.  Patient has been otherwise stable here in the emergency department and advised the patient.  Plan and all questions were answered  Final Clinical Impressions(s) / ED Diagnoses   Final diagnoses:  Syncope, unspecified syncope type    New Prescriptions New Prescriptions   No medications on file     Dalia Heading, PA-C 04/29/16 [place], MD 05/02/16 [id number]

## 2016-04-29 NOTE — H&P (Signed)
History and Physical    AUTHER CHAPLA [id number] DOB: [birth year] DOA: 04/29/2016  PCP: Alesia Richards, MD Patient coming from: home  Chief Complaint: syncope and collapse  HPI: James Parsons is a 71 y.o. male with medical history significant for diabetes type 2, hypertension, COPD, GERD, hyperlipidemia, chronic back pain, chronic left bundle branch block, chronic combined heart failure presents to the emergency department with chief complaint of syncope and collapse. Initial evaluation concerning for dehydration/orthostatic  Information is obtained from the patient and the wife is at the bedside. Patient was discharged 3 days ago after being hospitalized for acute on chronic combined systolic and diastolic heart failure. He was diuresed total of 5 L weight was down at discharge. Discharge summary indicates Lasix 40 mg daily recommended. He states patient did well first day at home eating and drinking his normal amount no complaints. Second day a little less than seemed a little more tired. Yesterday he was quite weak complained intermittent abdominal discomfort. No complaints of headache dizziness chest pain palpitations shortness of breath lower extremity edema. He was monitoring his weight and it was going down daily. No dysuria hematuria frequency or urgency. He does get up 2 or 3 times in the night to empty his bladder. Wife reports she awakened this morning and found him on the bathroom floor. He has no recollection of getting up and/or syncopized. No injury. Did not hit his head. No fever chills cough nausea vomiting diarrhea.   ED Course: In the emergency department he's afebrile hemodynamically stable and not hypoxic.  Review of Systems: As per HPI otherwise 10 point review of systems negative.   Ambulatory Status: She uses a cane at home. Minimal assist with ADLs  Past Medical History:  Diagnosis Date  . Allergy   . Anemia   . Arthritis   . Borderline hypertension     . Clotting disorder (HCC)    bleeds easily-no dx  . COPD (chronic obstructive pulmonary disease) ([place])    denies  . Depression    hx of   . Diabetes mellitus   . Diverticulosis of colon   . Full dentures   . GERD (gastroesophageal reflux disease)   . Hx of colonic polyps   . Hyperlipidemia   . Hypertension   . Lipoma   . Low back pain   . Mental disorder   . Obesity   . Pneumonia    hx  . RSD (reflex sympathetic dystrophy)   . Venous insufficiency     Past Surgical History:  Procedure Laterality Date  . BACK SURGERY  2005,2007  . C3-4 anterior cervical discectomy and fusion with plating at c3-4  05/2006   Dr. Arnoldo Morale  . CARDIAC CATHETERIZATION N/A 04/25/2016   Procedure: Right/Left Heart Cath and Coronary Angiography;  Surgeon: Peter M Martinique, MD;  Location: [facility];  Service: Cardiovascular;  Laterality: N/A;  . COLONOSCOPY    . COLONOSCOPY W/ POLYPECTOMY    . excision of lipoma from right olecranon area  11/2000   Dr. Rise [name]  . KNEE ARTHROSCOPY     right knee  . KNEE ARTHROSCOPY Right 06/06/2014   Procedure: ARTHROSCOPY RIGHT KNEE WITH REMOVAL OF FIBROUS BANDS;  Surgeon: Kerin Salen, MD;  Location: [facility];  Service: Orthopedics;  Laterality: Right;  . LUMBAR LAMINECTOMY/DECOMPRESSION MICRODISCECTOMY N/A 01/08/2014   Procedure: L4-S1 Decompression with removal and reimplantation of spinal cord stimulator battery ;  Surgeon: Melina Schools, MD;  Location: [facility];  Service: Orthopedics;  Laterality: N/A;  . microdiscectomy and decompression  05/2002   Dr. Tonita Cong  . morphine pump  2009   due to reaction to morphine/changed to fentanyl  . PAIN PUMP IMPLANTATION     with fentanyl  . spinal cord stimulator implanted     for pain per Dr. Maryruth Eve  . subcut pain pump implanted    . TONSILLECTOMY    . TOTAL KNEE ARTHROPLASTY  02/29/2012   Procedure: TOTAL KNEE ARTHROPLASTY;  Surgeon: Kerin Salen, MD;  Location: [facility];  Service: Orthopedics;   Laterality: Right;    Social History   Social History  . Marital status: Married    Spouse name: karen  . Number of children: 2  . Years of education: N/A   Occupational History  . retired Management consultant    Social History Main Topics  . Smoking status: Former Smoker    Packs/day: 3.00    Years: 40.00    Types: Cigarettes    Quit date: 05/25/2002  . Smokeless tobacco: Never Used  . Alcohol use No  . Drug use: No  . Sexual activity: No   Other Topics Concern  . Not on file   Social History Narrative  . No narrative on file    Allergies  Allergen Reactions  . Codeine Itching  . Morphine And Related Other (See Comments)    hallucinations    Family History  Problem Relation Age of Onset  . Cancer Father   . Colon cancer Neg Hx   . Esophageal cancer Neg Hx   . Stomach cancer Neg Hx   . Rectal cancer Neg Hx     Prior to Admission medications   Medication Sig Start Date End Date Taking? Authorizing Provider  aspirin EC 81 MG EC tablet Take 1 tablet (81 mg total) by mouth daily. 04/27/16  Yes Modena Jansky, MD  atorvastatin (LIPITOR) 80 MG tablet Take 1/2 to 1 tablet daily for cholesterol as directed 03/29/16 09/27/16 Yes Unk Pinto, MD  carvedilol (COREG) 3.125 MG tablet Take 1 tablet (3.125 mg total) by mouth 2 (two) times daily with a meal. 04/27/16  Yes Modena Jansky, MD  Cholecalciferol (VITAMIN D-3) 5000 UNITS TABS Take 5,000 Units by mouth 2 (two) times daily.   Yes Historical Provider, MD  cyclobenzaprine (FLEXERIL) 10 MG tablet Take 10 mg by mouth 3 (three) times daily as needed for muscle spasms. Muscle spasm. 03/02/11  Yes Historical Provider, MD  docusate sodium 100 MG CAPS Take 100 mg by mouth 2 (two) times daily. 04/09/14  Yes Shanker Kristeen Mans, MD  DULoxetine (CYMBALTA) 60 MG capsule Take 60 mg by mouth daily.  10/09/12  Yes Historical Provider, MD  folic acid (FOLVITE) 1 MG tablet Take 1 mg by mouth daily. 08/13/14  Yes Historical Provider,  MD  furosemide (LASIX) 40 MG tablet Take 1 tablet (40 mg total) by mouth daily. 04/26/16  Yes Modena Jansky, MD  glimepiride (AMARYL) 4 MG tablet TAKE 1 TABLET TWICE DAILY WITH MEALS. DO NOT TAKE IF YOU DO NOT EAT. 12/26/15  Yes Unk Pinto, MD  lidocaine (LIDODERM) 5 % Place 1 patch onto the skin daily as needed (pain). 04/26/16  Yes Modena Jansky, MD  lisinopril (PRINIVIL,ZESTRIL) 2.5 MG tablet Take 1 tablet (2.5 mg total) by mouth daily. 04/27/16  Yes Modena Jansky, MD  LYRICA 150 MG capsule Take 150 mg by mouth 2 (two) times daily.  02/21/11  Yes Historical Provider,  MD  metFORMIN (GLUCOPHAGE) 500 MG tablet Take 2 tablets (1,000 mg total) by mouth 2 (two) times daily. Currently held due to contrast your received for heart cath. May restart taking it on 04/28/16. 04/28/16  Yes Modena Jansky, MD  methotrexate (RHEUMATREX) 2.5 MG tablet Take 15 mg by mouth once a week. On Friday, 6 tablets 08/13/14  Yes Historical Provider, MD  metoCLOPramide (REGLAN) 10 MG tablet Take 1 tablet (10 mg total) by mouth 3 (three) times daily before meals. 03/17/16  Yes Courtney Forcucci, PA-C  oxyCODONE-acetaminophen (PERCOCET) 10-325 MG tablet Take 1 tablet by mouth every 8 (eight) hours as needed for pain. 04/01/16  Yes Historical Provider, MD  pantoprazole (PROTONIX) 40 MG tablet Take 1 tablet (40 mg total) by mouth 2 (two) times daily before a meal. 03/30/16 03/30/17 Yes Irene Shipper, MD  potassium chloride SA (K-DUR,KLOR-CON) 20 MEQ tablet Take 1 tablet (20 mEq total) by mouth daily. 04/27/16  Yes Modena Jansky, MD  pregabalin (LYRICA) 150 MG capsule Take 150 mg by mouth 2 (two) times daily. 04/07/16 06/06/16 Yes Historical Provider, MD  PRESCRIPTION MEDICATION 0.5 mcg/mL by Pump Prime route continuous. Fentanyl 0.05mg /ml solution 5,000 mcg. Patient will bring dosage instructions with him. Daily dose 0.84mg .   Yes Historical Provider, MD  ranitidine (ZANTAC) 300 MG tablet Take 1 tablet (300 mg total) by  mouth at bedtime. 02/22/16 02/21/17 Yes Vicie Mutters, PA-C  sucralfate (CARAFATE) 1 g tablet Take 1 tablet (1 g total) by mouth 4 (four) times daily. 03/17/16 03/17/17 Yes Courtney Forcucci, PA-C  tamsulosin (FLOMAX) 0.4 MG CAPS capsule Take 1 capsule (0.4 mg total) by mouth daily after supper. 08/20/15  Yes Courtney Forcucci, PA-C  traMADol (ULTRAM) 50 MG tablet Take 1 tablet (50 mg total) by mouth every 6 (six) hours as needed for moderate pain. pain 04/09/14  Yes Shanker Kristeen Mans, MD  triamcinolone cream (KENALOG) 0.1 % Apply 1 application topically 4 (four) times daily. 09/08/15  Yes Unk Pinto, MD    Physical Exam: Vitals:   04/29/16 1345 04/29/16 1415 04/29/16 1445 04/29/16 1630  BP: 106/66 112/64 111/70 102/65  Pulse: 97 96 95 105  Resp: 13 21 14 16   Temp:      TempSrc:      SpO2: 94% 95% 95% 97%  Weight:      Height:         General:  Appears calm And only slightly uncomfortable Eyes:  PERRL, EOMI, normal lids, iris ENT:  grossly normal hearing, lips & tongue, mucous membranes of his mouth are pink but dry Neck:  no LAD, masses or thyromegaly Cardiovascular:  , no m/r/g. No LE edema. Pedal pulses present and palpable Respiratory:  CTA bilaterally, no w/r/r. Normal respiratory effort. Abdomen:  soft, ntnd, NABS Skin:  no rash or induration seen on limited exam Musculoskeletal:  grossly normal tone BUE/BLE, good ROM, no bony abnormality Psychiatric:  grossly normal mood and affect, speech fluent and appropriate, AOx3 Neurologic:  CN 2-12 grossly intact, moves all extremities in coordinated fashion, sensation intact  Labs on Admission: I have personally reviewed following labs and imaging studies  CBC:  Recent Labs Lab 04/25/16 0544 04/29/16 1313  WBC 7.8 5.1  HGB 12.5* 11.1*  HCT 38.6* 34.1*  MCV 87.3 86.8  PLT 249 [id number]   Basic Metabolic Panel:  Recent Labs Lab 04/24/16 0355 04/25/16 0544 04/25/16 1957 04/26/16 0333 04/29/16 1313  NA 137 138 138 136  134*  K 3.2* 3.6 4.0 4.0  4.0  CL 97* 100* 102 102 101  CO2 29 25 27 26 25   GLUCOSE 140* 163* 191* 146* 126*  BUN 9 12 8 6 15   CREATININE 0.87 0.67 0.64 0.65 0.94  CALCIUM 8.8* 9.8 8.9 8.8* 8.7*  MG 1.6*  --  2.4 2.3  --    GFR: Estimated Creatinine Clearance: 79.9 mL/min (by C-G formula based on SCr of 0.94 mg/dL). Liver Function Tests: No results for input(s): AST, ALT, ALKPHOS, BILITOT, PROT, ALBUMIN in the last 168 hours. No results for input(s): LIPASE, AMYLASE in the last 168 hours. No results for input(s): AMMONIA in the last 168 hours. Coagulation Profile:  Recent Labs Lab 04/25/16 0544  INR 1.00   Cardiac Enzymes:  Recent Labs Lab 04/22/16 1950 04/23/16 0218 04/23/16 0915  TROPONINI 0.03* <0.03 <0.03   BNP (last 3 results) No results for input(s): PROBNP in the last 8760 hours. HbA1C: No results for input(s): HGBA1C in the last 72 hours. CBG:  Recent Labs Lab 04/25/16 1827 04/25/16 2150 04/26/16 1055 04/26/16 1614 04/29/16 1322  GLUCAP 139* 214* 169* 146* 97   Lipid Profile: No results for input(s): CHOL, HDL, LDLCALC, TRIG, CHOLHDL, LDLDIRECT in the last 72 hours. Thyroid Function Tests: No results for input(s): TSH, T4TOTAL, FREET4, T3FREE, THYROIDAB in the last 72 hours. Anemia Panel: No results for input(s): VITAMINB12, FOLATE, FERRITIN, TIBC, IRON, RETICCTPCT in the last 72 hours. Urine analysis:    Component Value Date/Time   COLORURINE YELLOW 04/29/2016 [place] 04/29/2016 1634   LABSPEC 1.014 04/29/2016 1634   PHURINE 6.0 04/29/2016 1634   GLUCOSEU NEGATIVE 04/29/2016 1634   HGBUR NEGATIVE 04/29/2016 1634   BILIRUBINUR NEGATIVE 04/29/2016 1634   KETONESUR NEGATIVE 04/29/2016 1634   PROTEINUR NEGATIVE 04/29/2016 1634   UROBILINOGEN 0.2 04/12/2014 1555   NITRITE NEGATIVE 04/29/2016 1634   LEUKOCYTESUR NEGATIVE 04/29/2016 1634    Creatinine Clearance: Estimated Creatinine Clearance: 79.9 mL/min (by C-G formula based  on SCr of 0.94 mg/dL).  Sepsis Labs: @LABRCNTIP (procalcitonin:4,lacticidven:4) )No results found for this or any previous visit (from the past 240 hour(s)).   Radiological Exams on Admission: Ct Head Wo Contrast  Result Date: 04/29/2016 CLINICAL DATA:  Found on floor.  Lethargy with generalized weakness. EXAM: CT HEAD WITHOUT CONTRAST TECHNIQUE: Contiguous axial images were obtained from the base of the skull through the vertex without intravenous contrast. COMPARISON:  03/31/2014 FINDINGS: Brain: There is no evidence of acute cortical infarct, intracranial hemorrhage, mass, midline shift, or extra-axial fluid collection. Mild generalized cerebral atrophy is unchanged. Vascular: Minimal calcified atherosclerosis at the skullbase. No hyperdense vessel. Skull: No fracture or focal osseous lesion. Sinuses/Orbits: Small left mastoid effusion. Paranasal sinuses are clear. Orbits are unremarkable. Other: None. IMPRESSION: No evidence of acute intracranial abnormality. Electronically Signed   By: Logan Bores M.D.   On: 04/29/2016 15:42    EKG: Independently reviewed. Right and left arm electrode reversal, interpretation assumes no reversal Sinus rhythm   Assessment/Plan Principal Problem:   Syncope and collapse Active Problems:   Essential hypertension   COPD (chronic obstructive pulmonary disease) (HCC)   Anemia   Chronic pain associated with significant psychosocial dysfunction   Non-ischemic cardiomyopathy (HCC)   Chronic combined systolic and diastolic HF (heart failure) ([place])   #1. Syncope and collapse. Likely related to over diuresis in the setting of multiple pain meds and sedating medications. CT of the head without acute abnormalities. No signs of infectious process. No metabolic derangement. Patient's only been home for 3  days after hospitalization for acute on chronic heart failure. She reports weight has been trending down. She reports compliance with his medications. Echo done 2 weeks  ago Systolic function was mildly to  moderately reduced. The estimated ejection fraction was in the   range of 40% to 45%. Diffuse hypokinesis. Doppler parameters are consistent with abnormal left ventricular relaxation (grade 1  diastolic dysfunction).  -Admit -. Gentle IV fluids -Hold Lasix for now -Daily weights -Monitor intake and output -Obtain orthostatic vital signs -Physical therapy evaluation  #2. chronic combined systolic and diastolic heart failure. Compensated. Chest x-ray as noted above Recent admission for heart failure exacerbation patient received IV Lasix 40 mg and was aggressively diuresed. Discharge was -5.2 L. At that time he had an echo which revealed the EF of 40% with diffuse hypokinesis and grade 1 diastolic dysfunction. He was evaluated by cardiology. Discharge summary indicates that during that hospitalization patient experienced dizziness related to diuresis and hypotension and near syncope and at that point his Lasix were held for the last couple of days of his discharge. New medications include lisinopril.  -Hold Lasix for now -Continue Coreg -Hold lisinopril for now do to a slightly soft blood pressure -Daily weights -Monitor intake and output -Resume diuretics as indicated  #3. Nonischemic cardiomyopathy. Last hospitalization patient was evaluated by cardiology. He underwent react cath.   4. Nonobstructive CAD/chronic left bundle branch block. No chest pain. Initial troponin negative EKG without acute changes Again he was evaluated by cardiology who recommended medical management. -Continue home medications with the exceptions of noted above  #5. COPD. Stable at baseline  6. Chronic pain. -Hold home Flexeril -Home regimen at a lower dose  #7. Hypertension. Blood pressure on the soft side on admission. Discharge 3 days ago he is provided with lisinopril Lasix and Coreg. -Holding Lasix and lisinopril for now -Continue Coreg -Ultra closely and resume home  medications as appropriate -Of note patient had dizziness and near syncopal episode while in the hospital last week.  #8. Diabetes. 7 hemoglobin A1c 9.5. Home medications include oral agents. Serum glucose 126 on admission -Hold oral agents for now as his appetite may be unreliable -Sliding scale insulin for optimal control -Patient follow-up    DVT prophylaxis: scd  Code Status: full  Family Communication: wife at bedside  Disposition Plan: home  Consults called: none  Admission status: obs    Radene Gunning MD [facility]  If 7PM-7AM, please contact night-coverage [email or web address] Password [email or web address]  04/29/2016, 5:08 PM

## 2016-04-29 NOTE — Telephone Encounter (Signed)
    Patient's wife called on call service because he had a syncopal episode this AM. No CP or SOB. Doesn't remember the fall. No post ictal sx.   Reviewed recent admission from 1/12-1/16/18 for new onset systolic CHF (EF [id number]). During admission, he had hypotension related dizziness and near syncope and Lasix was held for a few days.   I have told his wife to hold his lasix today and she will take his BP when he wakes up (now taking a nap). She says he is very weak as well. I told her to go back to the ER if he continues to decline. Otherwise, she will keep a close eye on him and hold lasix today.   Angelena Form PA-C  [facility]

## 2016-04-29 NOTE — ED Notes (Signed)
Pt was unable to stand long enough to complete his BP while standing. Only laying and sitting were able to be performed. Pt became pale upon standing and stated "I feel like Im going to fall over."

## 2016-04-29 NOTE — ED Triage Notes (Signed)
Patient from home found by wife on the floor- soon after he was found patient became alert and oriented. Patient does not recall event last thing he remembers was going to bed last night. Patient endorses generalized weakness. Patient denies pain, dizziness, lightheadedness, numbness, tingling, slurred speech, chest pain, shortness of breath. EMS applied C-collar.

## 2016-04-30 DIAGNOSIS — R55 Syncope and collapse: Secondary | ICD-10-CM

## 2016-04-30 LAB — CBC
HEMATOCRIT: 33.9 % — AB (ref 39.0–52.0)
HEMOGLOBIN: 10.8 g/dL — AB (ref 13.0–17.0)
MCH: 27.6 pg (ref 26.0–34.0)
MCHC: 31.9 g/dL (ref 30.0–36.0)
MCV: 86.7 fL (ref 78.0–100.0)
Platelets: 224 10*3/uL (ref 150–400)
RBC: 3.91 MIL/uL — AB (ref 4.22–5.81)
RDW: 15.4 % (ref 11.5–15.5)
WBC: 3.3 10*3/uL — AB (ref 4.0–10.5)

## 2016-04-30 LAB — GLUCOSE, CAPILLARY
GLUCOSE-CAPILLARY: 199 mg/dL — AB (ref 65–99)
GLUCOSE-CAPILLARY: 144 mg/dL — AB (ref 65–99)
Glucose-Capillary: 182 mg/dL — ABNORMAL HIGH (ref 65–99)
Glucose-Capillary: 177 mg/dL — ABNORMAL HIGH (ref 65–99)

## 2016-04-30 LAB — BASIC METABOLIC PANEL
Anion gap: 10 (ref 5–15)
BUN: 12 mg/dL (ref 6–20)
CHLORIDE: 101 mmol/L (ref 101–111)
CO2: 22 mmol/L (ref 22–32)
Calcium: 8.7 mg/dL — ABNORMAL LOW (ref 8.9–10.3)
Creatinine, Ser: 0.7 mg/dL (ref 0.61–1.24)
GFR calc non Af Amer: >60 mL/min (ref >60)
Glucose, Bld: 191 mg/dL — ABNORMAL HIGH (ref 65–99)
POTASSIUM: 3.6 mmol/L (ref 3.5–5.1)
SODIUM: 133 mmol/L — AB (ref 135–145)

## 2016-04-30 MED ORDER — SODIUM CHLORIDE 0.9 % IV SOLN
INTRAVENOUS | Status: DC
  Administered 2016-04-30: 07:00:00 via INTRAVENOUS

## 2016-04-30 MED ORDER — TRAMADOL HCL 50 MG PO TABS: 25.0000 mg | ORAL_TABLET | Freq: Two times a day (BID) | ORAL | Status: DC | PRN

## 2016-04-30 MED ORDER — SODIUM CHLORIDE 0.9 % IV BOLUS (SEPSIS)
250.0000 mL | Freq: Once | INTRAVENOUS | Status: AC
  Administered 2016-04-30: 250 mL via INTRAVENOUS

## 2016-04-30 MED ORDER — SODIUM CHLORIDE 0.9 % IV SOLN
INTRAVENOUS | Status: AC
  Administered 2016-04-30: 50 mL/h via INTRAVENOUS

## 2016-04-30 NOTE — Evaluation (Signed)
Physical Therapy Evaluation Patient Details Name: James Parsons MRN: [id number] DOB: [birth year] Today's Date: 04/30/2016   History of Present Illness  Patient is a 71 yo male admitted 04/29/16 following syncope with collapse.  Patient with dehydration and orthostatic hypotension.     PMH:  DM, HTN, COPD, HLD, CHF, LBBB, anemia, NICM, chronic back pain, back surgeries  Clinical Impression  Patient functioning at Mod I to supervision level with all mobility and gait.  Able to ambulate 71' with RW and supervision. Patient with improved balance and safety with use of RW during gait.  Instructed patient to use his RW at home for safety. No further acute PT needs identified - PT will sign off.    Follow Up Recommendations No PT follow up;Supervision - Intermittent    Equipment Recommendations  None recommended by PT    Recommendations for Other Services       Precautions / Restrictions Precautions Precautions: Fall Precaution Comments: Multiple falls at home per patient Restrictions Weight Bearing Restrictions: No      Mobility  Bed Mobility               General bed mobility comments: Patient in chair  Transfers Overall transfer level: Modified independent Equipment used: Rolling walker (2 wheeled)             General transfer comment: No physical assist required.  Ambulation/Gait Ambulation/Gait assistance: Supervision Ambulation Distance (Feet): 175 Feet Assistive device: Rolling walker (2 wheeled) Gait Pattern/deviations: Step-through pattern;Decreased stride length;Trunk flexed Gait velocity: decreased Gait velocity interpretation: Below normal speed for age/gender General Gait Details: Verbal cues for safe use of RW and to stand upright during gait.  No loss of balance during gait with RW.  Stairs            Wheelchair Mobility    Modified Rankin (Stroke Patients Only)       Balance Overall balance assessment: Needs assistance;History of  Falls         Standing balance support: No upper extremity supported Standing balance-Leahy Scale: Good                               Pertinent Vitals/Pain Pain Assessment: 0-10 Pain Score: 6  Pain Location: back Pain Descriptors / Indicators: Aching Pain Intervention(s): Monitored during session;Repositioned    Home Living Family/patient expects to be discharged to:: Private residence Living Arrangements: Spouse/significant other Available Help at Discharge: Family;Available PRN/intermittently (Wife and son work) Type of Home: House Home Access: Stairs to enter Entrance Stairs-Rails: None Entrance Stairs-Number of Steps: 1 Home Layout: One level Home Equipment: Environmental consultant - 2 wheels;Cane - single point;Bedside commode;Shower seat      Prior Function Level of Independence: Independent with assistive device(s)         Comments: Recently has been using cane.     Hand Dominance        Extremity/Trunk Assessment   Upper Extremity Assessment Upper Extremity Assessment: Overall WFL for tasks assessed    Lower Extremity Assessment Lower Extremity Assessment: Generalized weakness       Communication   Communication: No difficulties  Cognition Arousal/Alertness: Awake/alert Behavior During Therapy: WFL for tasks assessed/performed;Restless Overall Cognitive Status: Within Functional Limits for tasks assessed                      General Comments      Exercises     Assessment/Plan  PT Assessment Patent does not need any further PT services  PT Problem List            PT Treatment Interventions      PT Goals (Current goals can be found in the Care Plan section)  Acute Rehab PT Goals PT Goal Formulation: All assessment and education complete, DC therapy    Frequency     Barriers to discharge        Co-evaluation               End of Session Equipment Utilized During Treatment: Gait belt Activity Tolerance: Patient  tolerated treatment well Patient left: in chair;with call bell/phone within reach;with chair alarm set Nurse Communication: Mobility status (No PT needs)    Functional Assessment Tool Used: Clinical judgement Functional Limitation: Mobility: Walking and moving around Mobility: Walking and Moving Around Current Status [id number]): At least 1 percent but less than 20 percent impaired, limited or restricted Mobility: Walking and Moving Around Goal Status [phone]): At least 1 percent but less than 20 percent impaired, limited or restricted Mobility: Walking and Moving Around Discharge Status [phone]): At least 1 percent but less than 20 percent impaired, limited or restricted    Time: 1529-1559 PT Time Calculation (min) (ACUTE ONLY): 30 min   Charges:   PT Evaluation $PT Eval Moderate Complexity: 1 Procedure PT Treatments $Gait Training: 8-22 mins   PT G Codes:   PT G-Codes **NOT FOR INPATIENT CLASS** Functional Assessment Tool Used: Clinical judgement Functional Limitation: Mobility: Walking and moving around Mobility: Walking and Moving Around Current Status [id number]): At least 1 percent but less than 20 percent impaired, limited or restricted Mobility: Walking and Moving Around Goal Status [phone]): At least 1 percent but less than 20 percent impaired, limited or restricted Mobility: Walking and Moving Around Discharge Status [phone]): At least 1 percent but less than 20 percent impaired, limited or restricted    Despina Pole 04/30/2016, 4:14 PM Carita Pian. Sanjuana Kava, [place] Pager [phone]

## 2016-04-30 NOTE — Progress Notes (Addendum)
Pt was unable to tolerate sitting on the side of the bed to use urinal due to dizziness and weakness. Condom cath was placed on patient in order to measure output. Since patient was not able to tolerate sitting position, orthostatic vitals could not be obtained at this time. Will continue to monitor.

## 2016-04-30 NOTE — Progress Notes (Signed)
PROGRESS NOTE                                                                                                                                                                                                             Patient Demographics:    James Parsons, is a 71 y.o. male, DOB - [birth year], [id number]  Admit date - 04/29/2016   Admitting Physician Elwin Mocha, MD  Outpatient Primary MD for the patient is Alesia Richards, MD  LOS - 0  Chief Complaint  Patient presents with  . Loss of Consciousness       Brief Narrative James Parsons is a 71 y.o. male with medical history significant for diabetes type 2, hypertension, COPD, GERD, hyperlipidemia, chronic back pain, chronic left bundle branch block, chronic combined heart failure presents to the emergency department with chief complaint of syncope and collapse.    Subjective:    James Parsons today has, No headache, No chest pain, No abdominal pain - No Nausea, No new weakness tingling or numbness, No Cough - SOB.     Assessment  & Plan :     1.Syncope collapse. Head CT negative. His severely orthostatic and dehydrated, hold diuretics, apply TED stockings, hydrate and monitor. PT eval. Minimize sedating medications.  2. Chronic systolic and diastolic CHF. EF 40%. Currently dehydrated. Continue beta blocker, hold diuretics and ACE inhibitor.  3. Nonobstructive CAD. Chest pain-free no acute issues. Continue aspirin, statin and beta blocker for secondary prevention.  4. BPH. Continue Flomax for now.  5. Chronic pain. Counseled to reduce Flexeril and narcotic regimen.  6. Hypertension. Currently only on beta blocker.  7. COPD. At baseline no wheezing. Supportive care.   Family Communication  :  wife  Code Status :  Full  Diet : Diet heart healthy/carb modified Room service appropriate? Yes; Fluid consistency: Thin    Disposition Plan  :  Home  1-2 days  Consults  :  None  Procedures  :  CT Head - Non acute  DVT Prophylaxis  :  Lovenox    Lab Results  Component Value Date   PLT 224 04/30/2016    Inpatient Medications  Scheduled Meds: . aspirin EC  81 mg Oral Daily  . atorvastatin  80 mg Oral q1800  . carvedilol  3.125 mg Oral BID WC  .  docusate sodium  100 mg Oral BID  . DULoxetine  60 mg Oral Daily  . enoxaparin (LOVENOX) injection  40 mg Subcutaneous Q24H  . famotidine  10 mg Oral Daily  . folic acid  1 mg Oral Daily  . insulin aspart  0-5 Units Subcutaneous QHS  . insulin aspart  0-9 Units Subcutaneous TID WC  . metoCLOPramide  10 mg Oral TID AC  . pantoprazole  40 mg Oral BID AC  . pregabalin  150 mg Oral BID  . sodium chloride flush  3 mL Intravenous Q12H  . sucralfate  1 g Oral QID  . tamsulosin  0.4 mg Oral QPC supper   Continuous Infusions: . sodium chloride 50 mL/hr (04/30/16 1100)   PRN Meds:.[DISCONTINUED] ondansetron **OR** ondansetron (ZOFRAN) IV, [DISCONTINUED] oxyCODONE-acetaminophen **AND** oxyCODONE, [START ON 05/01/2016] traMADol  Antibiotics  :    Anti-infectives    None         Objective:   Vitals:   04/29/16 1810 04/29/16 1838 04/29/16 2001 04/30/16 0759  BP: 136/69 136/69 (!) 102/59 110/87  Pulse: (!) 101 (!) 105 96 (!) 103  Resp: 14  16   Temp: 99.5 F (37.5 C)  98.1 F (36.7 C)   TempSrc: Oral  Oral   SpO2:   94%   Weight: 120.8 kg (266 lb 5.1 oz)     Height: 5\' 9"  (1.753 m)       Wt Readings from Last 3 Encounters:  04/29/16 120.8 kg (266 lb 5.1 oz)  04/26/16 90.5 kg (199 lb 9.6 oz)  03/30/16 92.1 kg (203 lb)     Intake/Output Summary (Last 24 hours) at 04/30/16 1111 Last data filed at 04/30/16 0900  Gross per 24 hour  Intake              590 ml  Output              425 ml  Net              165 ml     Physical Exam  Awake Alert, Oriented X 3, No new F.N deficits, Normal affect [place].AT,PERRAL Supple Neck,No JVD, No cervical lymphadenopathy appriciated.    Symmetrical Chest wall movement, Good air movement bilaterally, CTAB RRR,No Gallops,Rubs or new Murmurs, No Parasternal Heave +ve B.Sounds, Abd Soft, No tenderness, No organomegaly appriciated, No rebound - guarding or rigidity. No Cyanosis, Clubbing or edema, No new Rash or bruise      Data Review:    CBC  Recent Labs Lab 04/25/16 0544 04/29/16 1313 04/30/16 0311  WBC 7.8 5.1 3.3*  HGB 12.5* 11.1* 10.8*  HCT 38.6* 34.1* 33.9*  PLT 249 237 224  MCV 87.3 86.8 86.7  MCH 28.3 28.2 27.6  MCHC 32.4 32.6 31.9  RDW 14.7 15.1 15.4    Chemistries   Recent Labs Lab 04/24/16 0355 04/25/16 0544 04/25/16 1957 04/26/16 0333 04/29/16 1313 04/30/16 0311  NA 137 138 138 136 134* 133*  K 3.2* 3.6 4.0 4.0 4.0 3.6  CL 97* 100* 102 102 101 101  CO2 29 25 27 26 25 22   GLUCOSE 140* 163* 191* 146* 126* 191*  BUN 9 12 8 6 15 12   CREATININE 0.87 0.67 0.64 0.65 0.94 0.70  CALCIUM 8.8* 9.8 8.9 8.8* 8.7* 8.7*  MG 1.6*  --  2.4 2.3  --   --    ------------------------------------------------------------------------------------------------------------------ No results for input(s): CHOL, HDL, LDLCALC, TRIG, CHOLHDL, LDLDIRECT in the last 72 hours.  Lab Results  Component Value Date   HGBA1C 9.5 (H) 04/22/2016   ------------------------------------------------------------------------------------------------------------------ No results for input(s): TSH, T4TOTAL, T3FREE, THYROIDAB in the last 72 hours.  Invalid input(s): FREET3 ------------------------------------------------------------------------------------------------------------------ No results for input(s): VITAMINB12, FOLATE, FERRITIN, TIBC, IRON, RETICCTPCT in the last 72 hours.  Coagulation profile  Recent Labs Lab 04/25/16 0544  INR 1.00    No results for input(s): DDIMER in the last 72 hours.  Cardiac Enzymes No results for input(s): CKMB, TROPONINI, MYOGLOBIN in the last 168 hours.  Invalid input(s):  CK ------------------------------------------------------------------------------------------------------------------    Component Value Date/Time   BNP 226.4 (H) 04/26/2016 1644    Micro Results No results found for this or any previous visit (from the past 240 hour(s)).  Radiology Reports    Ct Head Wo Contrast  Result Date: 04/29/2016 CLINICAL DATA:  Found on floor.  Lethargy with generalized weakness. EXAM: CT HEAD WITHOUT CONTRAST TECHNIQUE: Contiguous axial images were obtained from the base of the skull through the vertex without intravenous contrast. COMPARISON:  03/31/2014 FINDINGS: Brain: There is no evidence of acute cortical infarct, intracranial hemorrhage, mass, midline shift, or extra-axial fluid collection. Mild generalized cerebral atrophy is unchanged. Vascular: Minimal calcified atherosclerosis at the skullbase. No hyperdense vessel. Skull: No fracture or focal osseous lesion. Sinuses/Orbits: Small left mastoid effusion. Paranasal sinuses are clear. Orbits are unremarkable. Other: None. IMPRESSION: No evidence of acute intracranial abnormality. Electronically Signed   By: Logan Bores M.D.   On: 04/29/2016 15:42       Time Spent in minutes  30   Mouhamad Teed K M.D on 04/30/2016 at 11:11 AM  Between 7am to 7pm - Pager - [phone]  After 7pm go to [email or web address] - password [facility]  [facility] -  Office  [phone]

## 2016-04-30 NOTE — Progress Notes (Signed)
PT Cancellation Note  Patient Details Name: James Parsons MRN: [id number] DOB: 21-Sep-1945   Cancelled Treatment:    Reason Eval/Treat Not Completed: Medical issues which prohibited therapy Spoke with RN who reports patient had a near syncopal episode after standing this morning. Increasing fluids. Will check back for comprehensive PT evaluation this afternoon.  Ellouise Newer 04/30/2016, 11:17 AM   Elayne Snare, [place]

## 2016-04-30 NOTE — Progress Notes (Signed)
Per Dr. Candiss Norse order,I ambulated pt in the hall with 2 nurse techs, after about 50 feet patient said he "felt drunk" patient swayed to the right, he did this twice and his face became pale and his lips white. Called Dr. Candiss Norse to update, got orders for fluids, ted hose, patient to be in chair, and a set of orthostatics done. He ws positive with his orthostatics. Will continue to monitor, patient and wife made aware that pt will be staying another night.

## 2016-05-01 DIAGNOSIS — R55 Syncope and collapse: Secondary | ICD-10-CM | POA: Diagnosis not present

## 2016-05-01 LAB — GLUCOSE, CAPILLARY: GLUCOSE-CAPILLARY: 181 mg/dL — AB (ref 65–99)

## 2016-05-01 MED ORDER — CYCLOBENZAPRINE HCL 5 MG PO TABS: 5.0000 mg | ORAL_TABLET | Freq: Three times a day (TID) | ORAL | Status: DC | PRN

## 2016-05-01 MED ORDER — FUROSEMIDE 20 MG PO TABS: 20.0000 mg | ORAL_TABLET | Freq: Every day | ORAL | 0 refills | Status: DC

## 2016-05-01 NOTE — Progress Notes (Signed)
Patient given discharge instructions and all questions answered.  

## 2016-05-01 NOTE — Discharge Instructions (Signed)
Follow with Primary MD MCKEOWN,WILLIAM DAVID, MD in 7 days   Get CBC, CMP, 2 view Chest X ray checked  by Primary MD or SNF MD in 5-7 days ( we routinely change or add medications that can affect your baseline labs and fluid status, therefore we recommend that you get the mentioned basic workup next visit with your PCP, your PCP may decide not to get them or add new tests based on their clinical decision)   Activity: As tolerated with Full fall precautions use walker/cane & assistance as needed   Disposition Home    Diet:   Diet heart healthy/carb modified    For Heart failure patients - Check your Weight same time everyday, if you gain over 2 pounds, or you develop in leg swelling, experience more shortness of breath or chest pain, call your Primary MD immediately. Follow Cardiac Low Salt Diet and 1.5 lit/day fluid restriction.   On your next visit with your primary care physician please Get Medicines reviewed and adjusted.   Please request your Prim.MD to go over all Hospital Tests and Procedure/Radiological results at the follow up, please get all Hospital records sent to your Prim MD by signing hospital release before you go home.   If you experience worsening of your admission symptoms, develop shortness of breath, life threatening emergency, suicidal or homicidal thoughts you must seek medical attention immediately by calling 911 or calling your MD immediately  if symptoms less severe.  You Must read complete instructions/literature along with all the possible adverse reactions/side effects for all the Medicines you take and that have been prescribed to you. Take any new Medicines after you have completely understood and accpet all the possible adverse reactions/side effects.   Do not drive, operate heavy machinery, perform activities at heights, swimming or participation in water activities or provide baby sitting services if your were admitted for syncope or siezures until you have  seen by Primary MD or a Neurologist and advised to do so again.  Do not drive when taking Pain medications.    Do not take more than prescribed Pain, Sleep and Anxiety Medications  Special Instructions: If you have smoked or chewed Tobacco  in the last 2 yrs please stop smoking, stop any regular Alcohol  and or any Recreational drug use.  Wear Seat belts while driving.   Please note  You were cared for by a hospitalist during your hospital stay. If you have any questions about your discharge medications or the care you received while you were in the hospital after you are discharged, you can call the unit and asked to speak with the hospitalist on call if the hospitalist that took care of you is not available. Once you are discharged, your primary care physician will handle any further medical issues. Please note that NO REFILLS for any discharge medications will be authorized once you are discharged, as it is imperative that you return to your primary care physician (or establish a relationship with a primary care physician if you do not have one) for your aftercare needs so that they can reassess your need for medications and monitor your lab values.

## 2016-05-01 NOTE — Discharge Summary (Signed)
James Parsons [id number] DOB: 1946-01-24 DOA: 04/29/2016  PCP: Alesia Richards, MD  Admit date: 04/29/2016  Discharge date: 05/01/2016  Admitted From: Home   Disposition:  home   Recommendations for Outpatient Follow-up:   Follow up with PCP in 1-2 weeks  PCP Please obtain BMP/CBC, 2 view CXR in 1week,  (see Discharge instructions)   PCP Please follow up on the following pending results: Tapered down narcotics and sedative medications, monitor orthostatics closely   Home Health: None  Equipment/Devices: None Consultations: None Discharge Condition: Stable   CODE STATUS: Full  Diet Recommendation:  Heart Healthy low carbohydrate with 1.5 L daily fluid restriction   Chief Complaint  Patient presents with  . Loss of Consciousness     Brief history of present illness from the day of admission and additional interim summary    James Gaudreault Smithis a 71 y.o.malewith medical history significant for diabetes type 2, hypertension, COPD, GERD, hyperlipidemia, chronic back pain, chronic left bundle branch block, chronic combined heart failure presents to the emergency department with chief complaint of syncope and collapse.  Hospital issues addressed    1.Syncope collapse. Head CT negative. He was severely orthostatic and dehydrated,And his diuretics, give him IV fluids, applied TED stockings, orthostatics have stabilized this morning his symptom-free emulated without any discomfort with PT, have cut his Lasix dose in half, we'll request PCP to please Minimize sedating medications gradually. I'm cutting his Flexeril in half. Monitor supine blood pressure, orthostatics closely along with BMP.  2. Chronic systolic and diastolic CHF. EF 40%. Now compensated. Continue beta blocker and low-dose ACE inhibitor,  Lasix dose cut in half.  3. Nonobstructive CAD. Chest pain-free no acute issues. Continue aspirin, statin and beta blocker for secondary prevention.  4. BPH. Continue Flomax for now.  5. Chronic pain. Counseled to reduce Flexeril and narcotic regimen.  6. Hypertension. Continue beta blocker and low-dose ACE inhibitor PCP to monitor blood pressure and orthostatics closely.  7. COPD. At baseline no wheezing. Supportive care.    Discharge diagnosis     Principal Problem:   Syncope and collapse Active Problems:   Essential hypertension   COPD (chronic obstructive pulmonary disease) (HCC)   Anemia   Chronic pain associated with significant psychosocial dysfunction   Non-ischemic cardiomyopathy (HCC)   Chronic combined systolic and diastolic HF (heart failure) [facility])    Discharge instructions    Discharge Instructions    Discharge instructions    Complete by:  As directed    Follow with Primary MD MCKEOWN,WILLIAM DAVID, MD in 7 days   Get CBC, CMP, 2 view Chest X ray checked  by Primary MD or SNF MD in 5-7 days ( we routinely change or add medications that can affect your baseline labs and fluid status, therefore we recommend that you get the mentioned basic workup next visit with your PCP, your PCP may decide not to get them or add new tests based on their clinical decision)   Activity: As tolerated with Full fall precautions use walker/cane &  assistance as needed   Disposition Home    Diet:   Diet heart healthy/carb modified    For Heart failure patients - Check your Weight same time everyday, if you gain over 2 pounds, or you develop in leg swelling, experience more shortness of breath or chest pain, call your Primary MD immediately. Follow Cardiac Low Salt Diet and 1.5 lit/day fluid restriction.   On your next visit with your primary care physician please Get Medicines reviewed and adjusted.   Please request your Prim.MD to go over all Hospital Tests and  Procedure/Radiological results at the follow up, please get all Hospital records sent to your Prim MD by signing hospital release before you go home.   If you experience worsening of your admission symptoms, develop shortness of breath, life threatening emergency, suicidal or homicidal thoughts you must seek medical attention immediately by calling 911 or calling your MD immediately  if symptoms less severe.  You Must read complete instructions/literature along with all the possible adverse reactions/side effects for all the Medicines you take and that have been prescribed to you. Take any new Medicines after you have completely understood and accpet all the possible adverse reactions/side effects.   Do not drive, operate heavy machinery, perform activities at heights, swimming or participation in water activities or provide baby sitting services if your were admitted for syncope or siezures until you have seen by Primary MD or a Neurologist and advised to do so again.  Do not drive when taking Pain medications.    Do not take more than prescribed Pain, Sleep and Anxiety Medications  Special Instructions: If you have smoked or chewed Tobacco  in the last 2 yrs please stop smoking, stop any regular Alcohol  and or any Recreational drug use.  Wear Seat belts while driving.   Please note  You were cared for by a hospitalist during your hospital stay. If you have any questions about your discharge medications or the care you received while you were in the hospital after you are discharged, you can call the unit and asked to speak with the hospitalist on call if the hospitalist that took care of you is not available. Once you are discharged, your primary care physician will handle any further medical issues. Please note that NO REFILLS for any discharge medications will be authorized once you are discharged, as it is imperative that you return to your primary care physician (or establish a  relationship with a primary care physician if you do not have one) for your aftercare needs so that they can reassess your need for medications and monitor your lab values.   Increase activity slowly    Complete by:  As directed       Discharge Medications   Allergies as of 05/01/2016      Reactions   Codeine Itching   Morphine And Related Other (See Comments)   hallucinations      Medication List    STOP taking these medications   glimepiride 4 MG tablet Commonly known as:  AMARYL   traMADol 50 MG tablet Commonly known as:  ULTRAM     TAKE these medications   aspirin 81 MG EC tablet Take 1 tablet (81 mg total) by mouth daily.   atorvastatin 80 MG tablet Commonly known as:  LIPITOR Take 1/2 to 1 tablet daily for cholesterol as directed   carvedilol 3.125 MG tablet Commonly known as:  COREG Take 1 tablet (3.125 mg total) by mouth 2 (two)  times daily with a meal.   cyclobenzaprine 5 MG tablet Commonly known as:  FLEXERIL Take 1 tablet (5 mg total) by mouth 3 (three) times daily as needed for muscle spasms. Muscle spasm. What changed:  medication strength  how much to take   DSS 100 MG Caps Take 100 mg by mouth 2 (two) times daily.   DULoxetine 60 MG capsule Commonly known as:  CYMBALTA Take 60 mg by mouth daily.   folic acid 1 MG tablet Commonly known as:  FOLVITE Take 1 mg by mouth daily.   furosemide 20 MG tablet Commonly known as:  LASIX Take 1 tablet (20 mg total) by mouth daily. What changed:  medication strength  how much to take   lidocaine 5 % Commonly known as:  LIDODERM Place 1 patch onto the skin daily as needed (pain).   lisinopril 2.5 MG tablet Commonly known as:  PRINIVIL,ZESTRIL Take 1 tablet (2.5 mg total) by mouth daily.   LYRICA 150 MG capsule Generic drug:  pregabalin Take 150 mg by mouth 2 (two) times daily.   pregabalin 150 MG capsule Commonly known as:  LYRICA Take 150 mg by mouth 2 (two) times daily.   metFORMIN 500  MG tablet Commonly known as:  GLUCOPHAGE Take 2 tablets (1,000 mg total) by mouth 2 (two) times daily. Currently held due to contrast your received for heart cath. May restart taking it on 04/28/16.   methotrexate 2.5 MG tablet Commonly known as:  RHEUMATREX Take 15 mg by mouth once a week. On Friday, 6 tablets   metoCLOPramide 10 MG tablet Commonly known as:  REGLAN Take 1 tablet (10 mg total) by mouth 3 (three) times daily before meals.   oxyCODONE-acetaminophen 10-325 MG tablet Commonly known as:  PERCOCET Take 1 tablet by mouth every 8 (eight) hours as needed for pain.   pantoprazole 40 MG tablet Commonly known as:  PROTONIX Take 1 tablet (40 mg total) by mouth 2 (two) times daily before a meal.   potassium chloride SA 20 MEQ tablet Commonly known as:  K-DUR,KLOR-CON Take 1 tablet (20 mEq total) by mouth daily.   PRESCRIPTION MEDICATION 0.5 mcg/mL by Pump Prime route continuous. Fentanyl 0.05mg /ml solution 5,000 mcg. Patient will bring dosage instructions with him. Daily dose 0.84mg .   ranitidine 300 MG tablet Commonly known as:  ZANTAC Take 1 tablet (300 mg total) by mouth at bedtime.   sucralfate 1 g tablet Commonly known as:  CARAFATE Take 1 tablet (1 g total) by mouth 4 (four) times daily.   tamsulosin 0.4 MG Caps capsule Commonly known as:  FLOMAX Take 1 capsule (0.4 mg total) by mouth daily after supper.   triamcinolone cream 0.1 % Commonly known as:  KENALOG Apply 1 application topically 4 (four) times daily.   Vitamin D-3 5000 units Tabs Take 5,000 Units by mouth 2 (two) times daily.       Follow-up Information    MCKEOWN,WILLIAM DAVID, MD. Schedule an appointment as soon as possible for a visit in 1 week(s).   Specialty:  Internal Medicine Contact information: [address] [place] [address] [phone]           Major procedures and Radiology Reports - PLEASE review detailed and final reports thoroughly  -        Ct  Head Wo Contrast  Result Date: 04/29/2016 CLINICAL DATA:  Found on floor.  Lethargy with generalized weakness. EXAM: CT HEAD WITHOUT CONTRAST TECHNIQUE: Contiguous axial images were obtained from  the base of the skull through the vertex without intravenous contrast. COMPARISON:  03/31/2014 FINDINGS: Brain: There is no evidence of acute cortical infarct, intracranial hemorrhage, mass, midline shift, or extra-axial fluid collection. Mild generalized cerebral atrophy is unchanged. Vascular: Minimal calcified atherosclerosis at the skullbase. No hyperdense vessel. Skull: No fracture or focal osseous lesion. Sinuses/Orbits: Small left mastoid effusion. Paranasal sinuses are clear. Orbits are unremarkable. Other: None. IMPRESSION: No evidence of acute intracranial abnormality. Electronically Signed   By: Logan Bores M.D.   On: 04/29/2016 15:42      Micro Results     No results found for this or any previous visit (from the past 240 hour(s)).  Today   Subjective    James Parsons today has no headache,no chest abdominal pain,no new weakness tingling or numbness, feels much better wants to go home today.     Objective   Blood pressure 124/72, pulse 95, temperature 98 F (36.7 C), temperature source Oral, resp. rate 16, height 5\' 9"  (1.753 m), weight 89.7 kg (197 lb 12.8 oz), SpO2 94 %.   Intake/Output Summary (Last 24 hours) at 05/01/16 1035 Last data filed at 05/01/16 [id number]  Gross per 24 hour  Intake             1615 ml  Output             2850 ml  Net            -1235 ml    Exam Awake Alert, Oriented x 3, No new F.N deficits, Normal affect [place].AT,PERRAL Supple Neck,No JVD, No cervical lymphadenopathy appriciated.  Symmetrical Chest wall movement, Good air movement bilaterally, CTAB RRR,No Gallops,Rubs or new Murmurs, No Parasternal Heave +ve B.Sounds, Abd Soft, Non tender, No organomegaly appriciated, No rebound -guarding or rigidity. No Cyanosis, Clubbing or edema, No new Rash or  bruise   Data Review   CBC w Diff:  Lab Results  Component Value Date   WBC 3.3 (L) 04/30/2016   HGB 10.8 (L) 04/30/2016   HCT 33.9 (L) 04/30/2016   PLT 224 04/30/2016   LYMPHOPCT 25 03/23/2016   MONOPCT 5 03/23/2016   EOSPCT 4 03/23/2016   BASOPCT 0 03/23/2016    CMP:  Lab Results  Component Value Date   NA 133 (L) 04/30/2016   K 3.6 04/30/2016   CL 101 04/30/2016   CO2 22 04/30/2016   BUN 12 04/30/2016   CREATININE 0.70 04/30/2016   CREATININE 0.71 03/23/2016   PROT 7.1 04/22/2016   ALBUMIN 3.6 04/22/2016   BILITOT 0.7 04/22/2016   ALKPHOS 75 04/22/2016   AST 24 04/22/2016   ALT 12 (L) 04/22/2016  .   Total Time in preparing paper work, data evaluation and todays exam - 35 minutes  Thurnell Lose M.D on 05/01/2016 at 10:35 AM  [facility]   Office  [phone]

## 2016-05-01 NOTE — Progress Notes (Signed)
Patient took himself off the heart monitor saying that the dr said he was going to be discharged. Pt did not want "to waste any more stickers" to put him back on the monitor.  This RN tried to encourage patient to wear the monitor until the doctor put in the orders to be discharged.  Patient still declined to wear heart monitor.  Will continue to monitor.

## 2016-05-02 ENCOUNTER — Encounter: Payer: Self-pay | Admitting: Internal Medicine

## 2016-05-02 ENCOUNTER — Ambulatory Visit (INDEPENDENT_AMBULATORY_CARE_PROVIDER_SITE_OTHER): Payer: Medicare Other | Admitting: Internal Medicine

## 2016-05-02 VITALS — BP 106/68 | HR 84 | Temp 97.5°F | Resp 16 | Ht 67.0 in | Wt 198.8 lb

## 2016-05-02 DIAGNOSIS — I10 Essential (primary) hypertension: Secondary | ICD-10-CM

## 2016-05-02 DIAGNOSIS — J042 Acute laryngotracheitis: Secondary | ICD-10-CM | POA: Diagnosis not present

## 2016-05-02 DIAGNOSIS — R55 Syncope and collapse: Secondary | ICD-10-CM

## 2016-05-02 DIAGNOSIS — Z79899 Other long term (current) drug therapy: Secondary | ICD-10-CM

## 2016-05-02 LAB — CBC WITH DIFFERENTIAL/PLATELET
BASOS ABS: 0 {cells}/uL (ref 0–200)
Basophils Relative: 0 %
EOS ABS: 177 {cells}/uL (ref 15–500)
EOS PCT: 3 %
HCT: 35.4 % — ABNORMAL LOW (ref 38.5–50.0)
Hemoglobin: 11.6 g/dL — ABNORMAL LOW (ref 13.2–17.1)
LYMPHS PCT: 29 %
Lymphs Abs: 1711 cells/uL (ref 850–3900)
MCH: 28.1 pg (ref 27.0–33.0)
MCHC: 32.8 g/dL (ref 32.0–36.0)
MCV: 85.7 fL (ref 80.0–100.0)
MONOS PCT: 8 %
MPV: 10.4 fL (ref 7.5–12.5)
Monocytes Absolute: 472 cells/uL (ref 200–950)
NEUTROS ABS: 3540 {cells}/uL (ref 1500–7800)
NEUTROS PCT: 60 %
PLATELETS: 298 10*3/uL (ref 140–400)
RBC: 4.13 MIL/uL — ABNORMAL LOW (ref 4.20–5.80)
RDW: 15.3 % — AB (ref 11.0–15.0)
WBC: 5.9 10*3/uL (ref 3.8–10.8)

## 2016-05-02 LAB — BASIC METABOLIC PANEL WITH GFR
BUN: 11 mg/dL (ref 7–25)
CALCIUM: 9.4 mg/dL (ref 8.6–10.3)
CO2: 22 mmol/L (ref 20–31)
CREATININE: 0.75 mg/dL (ref 0.70–1.18)
Chloride: 102 mmol/L (ref 98–110)
GFR, Est Non African American: >89 mL/min (ref >=60)
Glucose, Bld: 219 mg/dL — ABNORMAL HIGH (ref 65–99)
Potassium: 4 mmol/L (ref 3.5–5.3)
SODIUM: 135 mmol/L (ref 135–146)

## 2016-05-02 MED ORDER — PREDNISONE 20 MG PO TABS: ORAL_TABLET | ORAL | 0 refills | Status: DC

## 2016-05-02 MED ORDER — AZITHROMYCIN 250 MG PO TABS: ORAL_TABLET | ORAL | 1 refills | Status: DC

## 2016-05-02 NOTE — Patient Instructions (Signed)
Near-Syncope Introduction Near-syncope is when you suddenly become weak or dizzy, or you feel like you might pass out (faint). During an episode of near-syncope, you may:  Feel dizzy or light-headed.  Feel nauseous.  See all white or all black in your field of vision.  Have cold, clammy skin. This condition is caused by a sudden decrease in blood flow to the brain. This decrease can result from various causes, but most of those causes are not dangerous. However, near-syncope can be a sign of a serious medical problem, so it is important to seek medical care. If you fainted, get medical help right away.Call your local emergency services (911 in the [place] Do not drive yourself to the hospital. Follow these instructions at home: Pay attention to any changes in your symptoms. Take these actions to help with your condition:  Have someone stay with you until you feel stable.  Do not drive, use machinery, or play sports until your health care provider says it is okay.  Keep all follow-up visits as told by your health care provider. This is important.  If you start to feel like you might faint, lie down right away and raise (elevate) your feet above the level of your heart. Breathe deeply and steadily. Wait until all of the symptoms have passed.  Drink enough fluid to keep your urine clear or pale yellow.  If you are taking blood pressure or heart medicine, get up slowly and take several minutes to sit and then stand. This can reduce dizziness.  Take over-the-counter and prescription medicines only as told by your health care provider. Get help right away if:  You have a severe headache.  You have unusual pain in your chest, abdomen, or back.  You are bleeding from your mouth or rectum, or you have black or tarry stool.  You have a very fast or irregular heartbeat (palpitations).  You faint once or repeatedly.  You have a seizure.  You are confused.  You have trouble  walking.  You have severe weakness.  You have vision problems. ++++++++++++++++++++++++++++ Syncope Syncope is when you temporarily lose consciousness. Syncope may also be called fainting or passing out. It is caused by a sudden decrease in blood flow to the brain. Even though most causes of syncope are not dangerous, syncope can be a sign of a serious medical problem. Signs that you may be about to faint include:  Feeling dizzy or light-headed.  Feeling nauseous.  Seeing all white or all black in your field of vision.  Having cold, clammy skin. If you fainted, get medical help right away.Call your local emergency services (911 in the [place] Do not drive yourself to the hospital. Follow these instructions at home: Pay attention to any changes in your symptoms. Take these actions to help with your condition:  Have someone stay with you until you feel stable.  Do not drive, use machinery, or play sports until your health care provider says it is okay.  Keep all follow-up visits as told by your health care provider. This is important.  If you start to feel like you might faint, lie down right away and raise (elevate) your feet above the level of your heart. Breathe deeply and steadily. Wait until all of the symptoms have passed.  Drink enough fluid to keep your urine clear or pale yellow.  If you are taking blood pressure or heart medicine, get up slowly and take several minutes to sit and then stand. This can reduce  dizziness.  Take over-the-counter and prescription medicines only as told by your health care provider. Get help right away if:  You have a severe headache.  You have unusual pain in your chest, abdomen, or back.  You are bleeding from your mouth or rectum, or you have black or tarry stool.  You have a very fast or irregular heartbeat (palpitations).  You have pain with breathing.  You faint once or repeatedly.  You have a seizure.  You are confused.  You  have trouble walking.  You have severe weakness.  You have vision problems. These symptoms may represent a serious problem that is an emergency. Do not wait to see if your symptoms will go away. Get medical help right away. Call your local emergency services (911 in the [place] Do not drive yourself to the hospital.

## 2016-05-02 NOTE — Progress Notes (Signed)
[facility] Unk Pinto, M.D.        Uvaldo Bristle. Silverio Lay, P.A.-C       Starlyn Skeans, P.A.-C  [facility]                [address] [place], [place] [id number] Telephone [phone] Telefax [phone] ______________________________________________________________________     This very nice 71 y.o. MWM presents for post hospital follow up with Hypertension, Hyperlipidemia, Pre-Diabetes and Vitamin D Deficiency. Patient also has a Chronic Pain Syndrome on maintenance Opioids.      Patient was just hospitalized 1/12-16 with heart failure and discharged on Lasix. Three days later his wife discovered him on the floor with an apparent syncopal episode and he was re-hospitalized,  given IVF for his low BP 80/40 and volume contraction and with stabilization he was discharged on a lower dose of Lasix 20 mg daily.  CXR and CT lung scans showed no heart failure or PNA. Patient was felt over sedated and advised to d/c Tramadol (continues Oxycodone) and to taper his flexeril  and allowed to continue his Cymbalta, and Lyrica.   Our clinical staff contacted the office the day after discharge to set up a follow up appointment.  This morning he had chills and fever to temp 100.3 deg and developed hoarseness & dry cough.       Patient is treated for HTN (2010) & today's BP is 106/68 by the nurse, but postural BP's as below suggest volume contraction. Patient has had no complaints of any cardiac type chest pain, palpitations, dyspnea/orthopnea/PND, dizziness, claudication, or dependent edema.     Hyperlipidemia is controlled with diet & meds. Patient denies myalgias or other med SE's. Last Lipids were not at goal: Lab Results  Component Value Date   CHOL 212 (H) 03/23/2016   HDL 35 (L) 03/23/2016   LDLCALC 117 (H) 03/23/2016   LDLDIRECT 178.6 09/08/2009   TRIG 298 (H) 03/23/2016   CHOLHDL 6.1 (H) 03/23/2016       Also, the patient has history of Morbid Obesity (BMI 31+)  / T2_NIDDM since 2007 and has had no symptoms of reactive hypoglycemia, diabetic polys, paresthesias or visual blurring.  Patient is not compliant with diet and last A1c was not at goal: Lab Results  Component Value Date   HGBA1C 9.5 (H) 04/22/2016      Further, the patient also has history of Vitamin D Deficiency and supplements vitamin D sporadically and last vitamin D was still very low:  Lab Results  Component Value Date   VD25OH 36 03/23/2016   Current Outpatient Prescriptions on File Prior to Visit  Medication Sig  . aspirin EC 81 MG EC tablet Take 1 tablet (81 mg total) by mouth daily.  [name] atorvastatin (LIPITOR) 80 MG tablet Take 1/2 to 1 tablet daily for cholesterol as directed  . carvedilol (COREG) 3.125 MG tablet Take 1 tablet (3.125 mg total) by mouth 2 (two) times daily with a meal.  . Cholecalciferol (VITAMIN D-3) 5000 UNITS TABS Take 5,000 Units by mouth 2 (two) times daily.  . cyclobenzaprine (FLEXERIL) 5 MG tablet Take 1 tablet (5 mg total) by mouth 3 (three) times daily as needed for muscle spasms. Muscle spasm.  . docusate sodium 100 MG CAPS Take 100 mg by mouth 2 (two) times daily.  . DULoxetine (CYMBALTA) 60 MG capsule Take 60 mg by mouth  daily.   . folic acid (FOLVITE) 1 MG tablet Take 1 mg by mouth daily.  . furosemide (LASIX) 20 MG tablet Take 1 tablet (20 mg total) by mouth daily.  [name] lidocaine (LIDODERM) 5 % Place 1 patch onto the skin daily as needed (pain).  [name] lisinopril (PRINIVIL,ZESTRIL) 2.5 MG tablet Take 1 tablet (2.5 mg total) by mouth daily.  . metFORMIN (GLUCOPHAGE) 500 MG tablet Take 2 tablets (1,000 mg total) by mouth 2 (two) times daily. Currently held due to contrast your received for heart cath. May restart taking it on 04/28/16.  . methotrexate (RHEUMATREX) 2.5 MG tablet Take 15 mg by mouth once a week. On Friday, 6 tablets  . metoCLOPramide (REGLAN) 10 MG tablet Take 1 tablet (10 mg total)  by mouth 3 (three) times daily before meals.  [name] oxyCODONE-acetaminophen (PERCOCET) 10-325 MG tablet Take 1 tablet by mouth every 8 (eight) hours as needed for pain.  . pantoprazole (PROTONIX) 40 MG tablet Take 1 tablet (40 mg total) by mouth 2 (two) times daily before a meal.  . potassium chloride SA (K-DUR,KLOR-CON) 20 MEQ tablet Take 1 tablet (20 mEq total) by mouth daily.  . pregabalin (LYRICA) 150 MG capsule Take 150 mg by mouth 2 (two) times daily.  [name] PRESCRIPTION MEDICATION 0.5 mcg/mL by Pump Prime route continuous. Fentanyl 0.05mg /ml solution 5,000 mcg. Patient will bring dosage instructions with him. Daily dose 0.84mg .  . ranitidine (ZANTAC) 300 MG tablet Take 1 tablet (300 mg total) by mouth at bedtime.  . sucralfate (CARAFATE) 1 g tablet Take 1 tablet (1 g total) by mouth 4 (four) times daily.  . tamsulosin (FLOMAX) 0.4 MG CAPS capsule Take 1 capsule (0.4 mg total) by mouth daily after supper.  . triamcinolone cream (KENALOG) 0.1 % Apply 1 application topically 4 (four) times daily.   No current facility-administered medications on file prior to visit.    Allergies  Allergen Reactions  . Codeine Itching  . Morphine And Related Other (See Comments)    hallucinations   PMHx:   Past Medical History:  Diagnosis Date  . Allergy   . Anemia   . Arthritis   . Borderline hypertension   . Clotting disorder (HCC)    bleeds easily-no dx  . COPD (chronic obstructive pulmonary disease) ([place])    denies  . Depression    hx of   . Diabetes mellitus   . Diverticulosis of colon   . Full dentures   . GERD (gastroesophageal reflux disease)   . Hx of colonic polyps   . Hyperlipidemia   . Hypertension   . Lipoma   . Low back pain   . Mental disorder   . Obesity   . Pneumonia    hx  . RSD (reflex sympathetic dystrophy)   . Venous insufficiency    Immunization History  Administered Date(s) Administered  . Influenza Split 04/22/2011, 01/20/2012  . Influenza Whole 01/15/2007,  01/21/2009, 03/08/2010  . Influenza, High Dose Seasonal PF 02/07/2013, 12/10/2015  . Influenza-Unspecified 12/10/2013, 01/10/2015  . Pneumococcal Conjugate-13 06/09/2015  . Pneumococcal Polysaccharide-23 04/22/2011  . Tdap 04/11/2001, 05/07/2013   Past Surgical History:  Procedure Laterality Date  . BACK SURGERY  2005,2007  . C3-4 anterior cervical discectomy and fusion with plating at c3-4  05/2006   Dr. Arnoldo Morale  . CARDIAC CATHETERIZATION N/A 04/25/2016   Procedure: Right/Left Heart Cath and Coronary Angiography;  Surgeon: Peter M Martinique, MD;  Location: [facility];  Service: Cardiovascular;  Laterality: N/A;  .  COLONOSCOPY    . COLONOSCOPY W/ POLYPECTOMY    . excision of lipoma from right olecranon area  11/2000   Dr. Rise [name]  . KNEE ARTHROSCOPY     right knee  . KNEE ARTHROSCOPY Right 06/06/2014   Procedure: ARTHROSCOPY RIGHT KNEE WITH REMOVAL OF FIBROUS BANDS;  Surgeon: Kerin Salen, MD;  Location: [facility];  Service: Orthopedics;  Laterality: Right;  . LUMBAR LAMINECTOMY/DECOMPRESSION MICRODISCECTOMY N/A 01/08/2014   Procedure: L4-S1 Decompression with removal and reimplantation of spinal cord stimulator battery ;  Surgeon: Melina Schools, MD;  Location: [facility];  Service: Orthopedics;  Laterality: N/A;  . microdiscectomy and decompression  05/2002   Dr. Tonita Cong  . morphine pump  2009   due to reaction to morphine/changed to fentanyl  . PAIN PUMP IMPLANTATION     with fentanyl  . spinal cord stimulator implanted     for pain per Dr. Maryruth Eve  . subcut pain pump implanted    . TONSILLECTOMY    . TOTAL KNEE ARTHROPLASTY  02/29/2012   Procedure: TOTAL KNEE ARTHROPLASTY;  Surgeon: Kerin Salen, MD;  Location: [facility];  Service: Orthopedics;  Laterality: Right;   FHx:    Reviewed / unchanged  SHx:    Reviewed / unchanged  Systems Review:  Constitutional: Denies fever, chills, wt changes, headaches, insomnia, fatigue, night sweats, change in appetite. Eyes:  Denies redness, blurred vision, diplopia, discharge, itchy, watery eyes.  ENT: Denies discharge, congestion, post nasal drip, epistaxis, sore throat, earache, hearing loss, dental pain, tinnitus, vertigo, sinus pain, snoring.  CV: Denies chest pain, palpitations, irregular heartbeat, syncope, dyspnea, diaphoresis, orthopnea, PND, claudication or edema. Respiratory: denies dyspnea, DOE, pleurisy,  laryngitis, wheezing.  Has new dry cough & hoarseness. Gastrointestinal: Denies dysphagia, odynophagia, heartburn, reflux, water brash, abdominal pain or cramps, nausea, vomiting, bloating, diarrhea, constipation, hematemesis, melena, hematochezia  or hemorrhoids. Genitourinary: Denies dysuria, frequency, urgency, nocturia, hesitancy, discharge, hematuria or flank pain. Musculoskeletal: Denies arthralgias, myalgias, stiffness, jt. swelling, pain, limping or strain/sprain.  Skin: Denies pruritus, rash, hives, warts, acne, eczema or change in skin lesion(s). Neuro: No weakness, tremor, incoordination, spasms, paresthesia or pain. Psychiatric: Denies confusion, memory loss or sensory loss. Endo: Denies change in weight, skin or hair change.  Heme/Lymph: No excessive bleeding, bruising or enlarged lymph nodes.  Physical Exam  BP 106/68   P 84   T 97.5 F    R 16   Ht 5\' 7"     Wt 198 lb 12.8 oz    BMI 31.14    Sitting BP 111/62 w/P 92 and Standing BP 90/52 w/ P94  Appears Over nourished and in no distress. Dry cough. Speech hoarse and decreased caliber.  Eyes: PERRLA, EOMs, conjunctiva no swelling or erythema. Sinuses: No frontal/maxillary tenderness ENT/Mouth: EAC's clear, TM's nl w/o erythema, bulging. Nares clear w/o erythema, swelling, exudates. Oropharynx clear without erythema or exudates. Oral hygiene is good. Tongue normal, non obstructing. Hearing intact.  Neck: Supple. Thyroid nl. Car 2+/2+ without bruits, nodes or JVD. Chest: Respirations nl with BS clear & equal w/few scattered  inspiratory rales, expiratory rhonchi and no  wheezing or stridor.  Cor: Heart sounds normal w/ regular rate and rhythm without sig. murmurs, gallops, clicks, or rubs. Peripheral pulses normal and equal  without edema.  Abdomen: Soft, rotund  & bowel sounds normal. Non-tender w/o guarding, rebound, hernias, masses, or organomegaly.  Lymphatics: Unremarkable.  Musculoskeletal: Full ROM with generalized decrease in muscle power, tone & bulk and uses a cane  to stabilize  gait.  Skin: Warm, dry without exposed rashes, lesions or ecchymosis apparent.  Neuro: Cranial nerves intact, reflexes equal bilaterally. Sensory-motor testing grossly intact. Tendon reflexes grossly intact.  Pysch: Alert & oriented x 3.  Insight and judgement nl & appropriate. No ideations.  Assessment and Plan:  1. Syncope  Recommend hold Lasix 20 mg x 3 more days,  increase po fluids and  Check bid postural BP's/pulses and  ROV in 2 weeks to recheck.  - CBC with Differential/Platelet - BASIC METABOLIC PANEL WITH GFR  2. Essential hypertension  - Continue medication, monitor blood pressure at home.  - Continue DASH diet. Reminder to go to the ER if any CP,  SOB, nausea, dizziness, severe HA, changes vision/speech,  left arm numbness and tingling and jaw pain.  3. Medication management  - CBC with Differential/Platelet - BASIC METABOLIC PANEL WITH GFR  4. Laryngotracheitis  - predniSONE (DELTASONE) 20 MG tablet; 1 tab 3 x day for 2 days, then 1 tab 2 x day for 2 days, then 1 tab 1 x day for 3 days  Dispense: 13 tablet; Refill: 0 - azithromycin (ZITHROMAX) 250 MG tablet; Take 2 tablets (500 mg) on  Day 1,  followed by 1 tablet (250 mg) once daily on Days 2 through 5.  Dispense: 6 each; Refill: 1       Recommended regular exercise, BP monitoring, weight control, and discussed med and SE's. Recommended labs to assess and monitor clinical status. Further disposition pending results of labs. Over 30 minutes of exam,  counseling, chart review was performed

## 2016-05-04 ENCOUNTER — Other Ambulatory Visit: Payer: Self-pay

## 2016-05-04 DIAGNOSIS — J042 Acute laryngotracheitis: Secondary | ICD-10-CM

## 2016-05-04 DIAGNOSIS — R1319 Other dysphagia: Secondary | ICD-10-CM

## 2016-05-04 DIAGNOSIS — K219 Gastro-esophageal reflux disease without esophagitis: Secondary | ICD-10-CM

## 2016-05-04 MED ORDER — SUCRALFATE 1 G PO TABS: 1.0000 g | ORAL_TABLET | Freq: Four times a day (QID) | ORAL | 3 refills | Status: DC

## 2016-05-04 MED ORDER — METOCLOPRAMIDE HCL 10 MG PO TABS: 10.0000 mg | ORAL_TABLET | Freq: Three times a day (TID) | ORAL | 1 refills | Status: DC

## 2016-05-05 ENCOUNTER — Other Ambulatory Visit: Payer: Self-pay

## 2016-05-05 DIAGNOSIS — R1319 Other dysphagia: Secondary | ICD-10-CM

## 2016-05-05 DIAGNOSIS — K219 Gastro-esophageal reflux disease without esophagitis: Secondary | ICD-10-CM

## 2016-05-05 DIAGNOSIS — R1310 Dysphagia, unspecified: Secondary | ICD-10-CM

## 2016-05-05 MED ORDER — PANTOPRAZOLE SODIUM 40 MG PO TBEC: 40.0000 mg | DELAYED_RELEASE_TABLET | Freq: Two times a day (BID) | ORAL | 1 refills | Status: DC

## 2016-05-11 ENCOUNTER — Ambulatory Visit: Payer: Self-pay | Admitting: Internal Medicine

## 2016-05-16 ENCOUNTER — Ambulatory Visit (INDEPENDENT_AMBULATORY_CARE_PROVIDER_SITE_OTHER): Payer: Medicare Other | Admitting: Internal Medicine

## 2016-05-16 ENCOUNTER — Encounter: Payer: Self-pay | Admitting: Internal Medicine

## 2016-05-16 VITALS — BP 120/68 | HR 92 | Temp 98.2°F | Ht 67.0 in | Wt 196.0 lb

## 2016-05-16 DIAGNOSIS — I10 Essential (primary) hypertension: Secondary | ICD-10-CM | POA: Diagnosis not present

## 2016-05-16 DIAGNOSIS — G894 Chronic pain syndrome: Secondary | ICD-10-CM | POA: Diagnosis not present

## 2016-05-16 DIAGNOSIS — Z79899 Other long term (current) drug therapy: Secondary | ICD-10-CM | POA: Diagnosis not present

## 2016-05-16 LAB — BASIC METABOLIC PANEL WITH GFR
BUN: 18 mg/dL (ref 7–25)
CALCIUM: 9.4 mg/dL (ref 8.6–10.3)
CHLORIDE: 98 mmol/L (ref 98–110)
CO2: 28 mmol/L (ref 20–31)
Creat: 0.73 mg/dL (ref 0.70–1.18)
GFR, Est African American: >89 mL/min (ref >=60)
GLUCOSE: 181 mg/dL — AB (ref 65–99)
Potassium: 4 mmol/L (ref 3.5–5.3)
SODIUM: 136 mmol/L (ref 135–146)

## 2016-05-16 LAB — CBC WITH DIFFERENTIAL/PLATELET
BASOS PCT: 0 %
Basophils Absolute: 0 cells/uL (ref 0–200)
EOS PCT: 2 %
Eosinophils Absolute: 248 cells/uL (ref 15–500)
HEMATOCRIT: 36.7 % — AB (ref 38.5–50.0)
HEMOGLOBIN: 12 g/dL — AB (ref 13.2–17.1)
LYMPHS ABS: 3844 {cells}/uL (ref 850–3900)
Lymphocytes Relative: 31 %
MCH: 28 pg (ref 27.0–33.0)
MCHC: 32.7 g/dL (ref 32.0–36.0)
MCV: 85.7 fL (ref 80.0–100.0)
MONO ABS: 496 {cells}/uL (ref 200–950)
MPV: 10.2 fL (ref 7.5–12.5)
Monocytes Relative: 4 %
NEUTROS ABS: 7812 {cells}/uL — AB (ref 1500–7800)
Neutrophils Relative %: 63 %
Platelets: 256 10*3/uL (ref 140–400)
RBC: 4.28 MIL/uL (ref 4.20–5.80)
RDW: 15.6 % — ABNORMAL HIGH (ref 11.0–15.0)
WBC: 12.4 10*3/uL — AB (ref 3.8–10.8)

## 2016-05-16 NOTE — Progress Notes (Signed)
Assessment and Plan:   1. Essential hypertension -BP appears improved along with orthostasis -does appear to be improving in hydration status -not sure whether he is using his medications correctly as he does not manage them - CBC with Differential/Platelet - BASIC METABOLIC PANEL WITH GFR  2. Medication management  - CBC with Differential/Platelet - BASIC METABOLIC PANEL WITH GFR  3.  Polypharmacy -patient appears nearly intoxicated here and is likely secondary to being overmedicated with multiple sedating medications including dilaudid, fentanyl, lyrica, and cymbalta.  Given patient's COPD history and recent syncope and unsteady gait patient is extremely high fall risk and is also at risk for hypercarbic respiratory failure.  Recommend large decrease in medications.  I told patient he needed to cut back on medications.  He drove himself.  I attempted to have him get a ride but he absconded from the office prior to staff stopping him.  He drove himself home.  I will submit a reveiwal form to the [facility] for potential removal of his drivers license while taking narcotics.     HPI 71 y.o.male presents for 2 week follow-up of hypotension.  He reports that he was seen by Dr. [name] for a syncopal episode and he was taken to the hospital.  He reports that he has not been checking it, but his wife has been.  He reports that she has not said anything about it.  He reports that he has not had any dizziness or lightheadedness.  He reports that he has had no further syncope.  He was supposed to stop his furosemide for 3 days and cut back to 20 mg.  He is not managing his medications currently.  He was recently taken off of oxycodone and was changed to dilauded.  He is still taking fentanyl.  They did cut back on the dose of it.  He reports that he is only taking about half of what he used to.  They did take him off of his muscle relaxer.  He reports that he is seeing Pain management again on March the 8th.   He is mostly seeing Dr. Mechele Dawley.    Past Medical History:  Diagnosis Date  . Allergy   . Anemia   . Arthritis   . Borderline hypertension   . Clotting disorder (HCC)    bleeds easily-no dx  . COPD (chronic obstructive pulmonary disease) ([place])    denies  . Depression    hx of   . Diabetes mellitus   . Diverticulosis of colon   . Full dentures   . GERD (gastroesophageal reflux disease)   . Hx of colonic polyps   . Hyperlipidemia   . Hypertension   . Lipoma   . Low back pain   . Mental disorder   . Obesity   . Pneumonia    hx  . RSD (reflex sympathetic dystrophy)   . Venous insufficiency      Allergies  Allergen Reactions  . Codeine Itching  . Morphine And Related Other (See Comments)    hallucinations      Current Outpatient Prescriptions on File Prior to Visit  Medication Sig Dispense Refill  . aspirin EC 81 MG EC tablet Take 1 tablet (81 mg total) by mouth daily. 30 tablet 0  . atorvastatin (LIPITOR) 80 MG tablet Take 1/2 to 1 tablet daily for cholesterol as directed 90 tablet 1  . carvedilol (COREG) 3.125 MG tablet Take 1 tablet (3.125 mg total) by mouth 2 (two) times daily with  a meal. 60 tablet 0  . Cholecalciferol (VITAMIN D-3) 5000 UNITS TABS Take 5,000 Units by mouth 2 (two) times daily.    . cyclobenzaprine (FLEXERIL) 5 MG tablet Take 1 tablet (5 mg total) by mouth 3 (three) times daily as needed for muscle spasms. Muscle spasm.    . docusate sodium 100 MG CAPS Take 100 mg by mouth 2 (two) times daily. 10 capsule 0  . DULoxetine (CYMBALTA) 60 MG capsule Take 60 mg by mouth daily.     . folic acid (FOLVITE) 1 MG tablet Take 1 mg by mouth daily.  1  . furosemide (LASIX) 20 MG tablet Take 1 tablet (20 mg total) by mouth daily. 30 tablet 0  . glimepiride (AMARYL) 4 MG tablet TAKE 1 TABLET TWICE DAILY WITH MEALS. DO NOT TAKE IF YOU DO NOT EAT.  1  . lidocaine (LIDODERM) 5 % Place 1 patch onto the skin daily as needed (pain).    [name] lisinopril (PRINIVIL,ZESTRIL) 2.5  MG tablet Take 1 tablet (2.5 mg total) by mouth daily. 30 tablet 0  . metFORMIN (GLUCOPHAGE) 500 MG tablet Take 2 tablets (1,000 mg total) by mouth 2 (two) times daily. Currently held due to contrast your received for heart cath. May restart taking it on 04/28/16.    . methotrexate (RHEUMATREX) 2.5 MG tablet Take 15 mg by mouth once a week. On Friday, 6 tablets  1  . metoCLOPramide (REGLAN) 10 MG tablet Take 1 tablet (10 mg total) by mouth 3 (three) times daily before meals. 270 tablet 1  . pantoprazole (PROTONIX) 40 MG tablet Take 1 tablet (40 mg total) by mouth 2 (two) times daily before a meal. 180 tablet 1  . potassium chloride SA (K-DUR,KLOR-CON) 20 MEQ tablet Take 1 tablet (20 mEq total) by mouth daily. 30 tablet 0  . pregabalin (LYRICA) 150 MG capsule Take 150 mg by mouth 2 (two) times daily.    [name] PRESCRIPTION MEDICATION 0.5 mcg/mL by Pump Prime route continuous. Fentanyl 0.05mg /ml solution 5,000 mcg. Patient will bring dosage instructions with him. Daily dose 0.84mg .    . ranitidine (ZANTAC) 300 MG tablet Take 1 tablet (300 mg total) by mouth at bedtime. 90 tablet 0  . sucralfate (CARAFATE) 1 g tablet Take 1 tablet (1 g total) by mouth 4 (four) times daily. 360 tablet 3  . tamsulosin (FLOMAX) 0.4 MG CAPS capsule Take 1 capsule (0.4 mg total) by mouth daily after supper. 90 capsule 3  . triamcinolone cream (KENALOG) 0.1 % Apply 1 application topically 4 (four) times daily. 45 g 0   No current facility-administered medications on file prior to visit.     ROS: all negative except above.   Physical Exam: Filed Weights   05/16/16 1520  Weight: 196 lb (88.9 kg)   BP 120/68   Pulse 92   Temp 98.2 F (36.8 C) (Temporal)   Ht 5\' 7"  (1.702 m)   Wt 196 lb (88.9 kg)   BMI 30.70 kg/m  General Appearance: Patient appears intoxicated, Well developed well nourished, non-toxic appearing in no apparent distress. Eyes: PERRLA, EOMs, conjunctiva w/ no swelling or erythema or discharge Sinuses: No  Frontal/maxillary tenderness ENT/Mouth: Ear canals clear without swelling or erythema.  TM's normal bilaterally with no retractions, bulging, or loss of landmarks.   Neck: Supple, thyroid normal, no notable JVD  Respiratory: Respiratory effort normal, Clear breath sounds anteriorly and posteriorly bilaterally without rales, rhonchi, wheezing or stridor. No retractions or accessory muscle usage. Cardio: RRR with no  MRGs.   Abdomen: Soft, + BS.  Non tender, no guarding, rebound, hernias, masses.  Musculoskeletal: Full ROM, 5/5 strength, [place] gait and improper use of cane Skin: Warm, dry without rashes  Neuro: Awake and oriented X 3, Cranial nerves intact. Normal muscle tone, no cerebellar symptoms. Sensation intact.  Psych: Excessively happy and slurring speech.  Not attentive, Insight and Judgment appropriate.     Starlyn Skeans, PA-C 3:41 PM [facility]

## 2016-05-16 NOTE — Patient Instructions (Signed)
Please continue your current medication routine.  Please try to cut back on your pain medication to the absolute minimum that you can tolerate.  I will send a note to Dr. Mechele Dawley about cutting on your medications.

## 2016-05-17 ENCOUNTER — Encounter: Payer: Self-pay | Admitting: Cardiology

## 2016-05-17 ENCOUNTER — Ambulatory Visit (INDEPENDENT_AMBULATORY_CARE_PROVIDER_SITE_OTHER): Payer: Medicare Other | Admitting: Cardiology

## 2016-05-17 VITALS — BP 118/68 | HR 96 | Ht 67.0 in | Wt 194.4 lb

## 2016-05-17 DIAGNOSIS — I10 Essential (primary) hypertension: Secondary | ICD-10-CM | POA: Diagnosis not present

## 2016-05-17 DIAGNOSIS — R55 Syncope and collapse: Secondary | ICD-10-CM | POA: Diagnosis not present

## 2016-05-17 DIAGNOSIS — I428 Other cardiomyopathies: Secondary | ICD-10-CM

## 2016-05-17 DIAGNOSIS — I2510 Atherosclerotic heart disease of native coronary artery without angina pectoris: Secondary | ICD-10-CM | POA: Diagnosis not present

## 2016-05-17 DIAGNOSIS — I447 Left bundle-branch block, unspecified: Secondary | ICD-10-CM | POA: Insufficient documentation

## 2016-05-17 MED ORDER — CARVEDILOL 6.25 MG PO TABS: 6.2500 mg | ORAL_TABLET | Freq: Two times a day (BID) | ORAL | 3 refills | Status: DC

## 2016-05-17 NOTE — Assessment & Plan Note (Signed)
Controlled.  

## 2016-05-17 NOTE — Patient Instructions (Signed)
Medication Instructions:  INCREASE Coreg to 6.25 mg two times daily.    Labwork: NONE  Testing/Procedures: NONE  Follow-Up: Wednesday 08/17/16 at 11:00 AM with Dr. Gwenlyn Found at [facility] office.     Any Other Special Instructions Will Be Listed Below (If Applicable).     If you need a refill on your cardiac medications before your next appointment, please call your pharmacy.

## 2016-05-17 NOTE — Progress Notes (Signed)
05/17/2016 James Parsons   1945-10-06  [id number]  Primary Physician Unk Pinto DAVID, MD Primary Cardiologist: Dr Gwenlyn Found   HPI:  71 y/o male seen by Dr Gwenlyn Found 04/25/16 after he was admitted with chest pain and CHF with several risk factors for CAD. Echo showed an EF of 40-45%. Cath done 04/25/16 showed moderate CAD with an EF of 35%. He was diuresed and discharged 04/26/16. He then presented to the ED 04/29/16 with syncope and was felt to be dehydrated. He was hydrated and his diuretics adjusted. He was his PCP yesterday and labs were drawn. He is doing well now, no further syncope, chest pain, or SOB.    Current Outpatient Prescriptions  Medication Sig Dispense Refill  . aspirin EC 81 MG EC tablet Take 1 tablet (81 mg total) by mouth daily. 30 tablet 0  . atorvastatin (LIPITOR) 80 MG tablet Take 1/2 to 1 tablet daily for cholesterol as directed 90 tablet 1  . carvedilol (COREG) 6.25 MG tablet Take 1 tablet (6.25 mg total) by mouth 2 (two) times daily with a meal. 30 tablet 3  . Cholecalciferol (VITAMIN D-3) 5000 UNITS TABS Take 5,000 Units by mouth 2 (two) times daily.    . cyclobenzaprine (FLEXERIL) 5 MG tablet Take 1 tablet (5 mg total) by mouth 3 (three) times daily as needed for muscle spasms. Muscle spasm.    . docusate sodium 100 MG CAPS Take 100 mg by mouth 2 (two) times daily. 10 capsule 0  . DULoxetine (CYMBALTA) 60 MG capsule Take 60 mg by mouth daily.     . folic acid (FOLVITE) 1 MG tablet Take 1 mg by mouth daily.  1  . furosemide (LASIX) 20 MG tablet Take 1 tablet (20 mg total) by mouth daily. 30 tablet 0  . glimepiride (AMARYL) 4 MG tablet TAKE 1 TABLET TWICE DAILY WITH MEALS. DO NOT TAKE IF YOU DO NOT EAT.  1  . HYDROmorphone (DILAUDID) 2 MG tablet Take 2 mg by mouth every 8 (eight) hours as needed for severe pain.    [name] lidocaine (LIDODERM) 5 % Place 1 patch onto the skin daily as needed (pain).    [name] lisinopril (PRINIVIL,ZESTRIL) 2.5 MG tablet Take 1 tablet (2.5 mg  total) by mouth daily. 30 tablet 0  . metFORMIN (GLUCOPHAGE) 500 MG tablet Take 2 tablets (1,000 mg total) by mouth 2 (two) times daily. Currently held due to contrast your received for heart cath. May restart taking it on 04/28/16.    . methotrexate (RHEUMATREX) 2.5 MG tablet Take 15 mg by mouth once a week. On Friday, 6 tablets  1  . metoCLOPramide (REGLAN) 10 MG tablet Take 1 tablet (10 mg total) by mouth 3 (three) times daily before meals. 270 tablet 1  . pantoprazole (PROTONIX) 40 MG tablet Take 1 tablet (40 mg total) by mouth 2 (two) times daily before a meal. 180 tablet 1  . potassium chloride SA (K-DUR,KLOR-CON) 20 MEQ tablet Take 1 tablet (20 mEq total) by mouth daily. 30 tablet 0  . pregabalin (LYRICA) 150 MG capsule Take 150 mg by mouth 2 (two) times daily.    [name] PRESCRIPTION MEDICATION 0.5 mcg/mL by Pump Prime route continuous. Fentanyl 0.05mg /ml solution 5,000 mcg. Patient will bring dosage instructions with him. Daily dose 0.84mg .    . ranitidine (ZANTAC) 300 MG tablet Take 1 tablet (300 mg total) by mouth at bedtime. 90 tablet 0  . sucralfate (CARAFATE) 1 g tablet Take 1 tablet (1 g total) by  mouth 4 (four) times daily. 360 tablet 3  . tamsulosin (FLOMAX) 0.4 MG CAPS capsule Take 1 capsule (0.4 mg total) by mouth daily after supper. 90 capsule 3  . triamcinolone cream (KENALOG) 0.1 % Apply 1 application topically 4 (four) times daily. 45 g 0   No current facility-administered medications for this visit.     Allergies  Allergen Reactions  . Codeine Itching  . Morphine And Related Other (See Comments)    hallucinations    Social History   Social History  . Marital status: Married    Spouse name: karen  . Number of children: 2  . Years of education: N/A   Occupational History  . retired Management consultant    Social History Main Topics  . Smoking status: Former Smoker    Packs/day: 3.00    Years: 40.00    Types: Cigarettes    Quit date: 05/25/2002  . Smokeless  tobacco: Never Used  . Alcohol use No  . Drug use: No  . Sexual activity: No   Other Topics Concern  . Not on file   Social History Narrative  . No narrative on file     Review of Systems: General: negative for chills, fever, night sweats or weight changes.  Cardiovascular: negative for chest pain, dyspnea on exertion, edema, orthopnea, palpitations, paroxysmal nocturnal dyspnea or shortness of breath Dermatological: negative for rash Respiratory: negative for cough or wheezing Urologic: negative for hematuria Abdominal: negative for nausea, vomiting, diarrhea, bright red blood per rectum, melena, or hematemesis Neurologic: negative for visual changes, syncope, or dizziness All other systems reviewed and are otherwise negative except as noted above.    Blood pressure 118/68, pulse 96, height 5\' 7"  (1.702 m), weight 194 lb 6.4 oz (88.2 kg).  General appearance: alert, cooperative and no distress Neck: no JVD Lungs: clear to auscultation bilaterally Heart: regular rate and rhythm Extremities: extremities normal, atraumatic, no cyanosis or edema Skin: Skin color, texture, turgor normal. 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   PLAN  I suggested James Parsons increase his Coreg to 6.25 mg BID. If he can't tolerate this dose because of low B/P I would consider changing him to Lopressor. F/U with Dr Gwenlyn Found in 3 months.   Kerin Ransom PA-C 05/17/2016 1:34 PM

## 2016-05-17 NOTE — Assessment & Plan Note (Signed)
40% RCA, 60% RI at cath 04/25/16

## 2016-05-17 NOTE — Assessment & Plan Note (Signed)
NIDDM 

## 2016-05-17 NOTE — Assessment & Plan Note (Signed)
EF 40-45% by echo, EF 35% by cath

## 2016-05-17 NOTE — Assessment & Plan Note (Signed)
Pt admitted with dehydration secondary to over diuresis 04/29/16

## 2016-05-20 ENCOUNTER — Other Ambulatory Visit: Payer: Self-pay | Admitting: Physician Assistant

## 2016-06-20 ENCOUNTER — Ambulatory Visit: Payer: Medicare Other | Admitting: Internal Medicine

## 2016-06-23 ENCOUNTER — Ambulatory Visit: Payer: Self-pay | Admitting: Internal Medicine

## 2016-06-26 ENCOUNTER — Other Ambulatory Visit: Payer: Self-pay | Admitting: Internal Medicine

## 2016-06-27 ENCOUNTER — Encounter: Payer: Self-pay | Admitting: Physician Assistant

## 2016-06-27 ENCOUNTER — Ambulatory Visit (INDEPENDENT_AMBULATORY_CARE_PROVIDER_SITE_OTHER): Payer: Medicare Other | Admitting: Physician Assistant

## 2016-06-27 VITALS — BP 134/70 | HR 99 | Temp 97.5°F | Resp 16 | Ht 67.0 in | Wt 197.8 lb

## 2016-06-27 DIAGNOSIS — I428 Other cardiomyopathies: Secondary | ICD-10-CM | POA: Diagnosis not present

## 2016-06-27 DIAGNOSIS — M961 Postlaminectomy syndrome, not elsewhere classified: Secondary | ICD-10-CM

## 2016-06-27 DIAGNOSIS — R6889 Other general symptoms and signs: Secondary | ICD-10-CM | POA: Diagnosis not present

## 2016-06-27 DIAGNOSIS — Z0001 Encounter for general adult medical examination with abnormal findings: Secondary | ICD-10-CM | POA: Diagnosis not present

## 2016-06-27 DIAGNOSIS — M4800 Spinal stenosis, site unspecified: Secondary | ICD-10-CM

## 2016-06-27 DIAGNOSIS — M47812 Spondylosis without myelopathy or radiculopathy, cervical region: Secondary | ICD-10-CM

## 2016-06-27 DIAGNOSIS — E1121 Type 2 diabetes mellitus with diabetic nephropathy: Secondary | ICD-10-CM

## 2016-06-27 DIAGNOSIS — G894 Chronic pain syndrome: Secondary | ICD-10-CM

## 2016-06-27 DIAGNOSIS — G8929 Other chronic pain: Secondary | ICD-10-CM

## 2016-06-27 DIAGNOSIS — G9050 Complex regional pain syndrome I, unspecified: Secondary | ICD-10-CM

## 2016-06-27 DIAGNOSIS — Z79899 Other long term (current) drug therapy: Secondary | ICD-10-CM | POA: Diagnosis not present

## 2016-06-27 DIAGNOSIS — I10 Essential (primary) hypertension: Secondary | ICD-10-CM

## 2016-06-27 DIAGNOSIS — E876 Hypokalemia: Secondary | ICD-10-CM

## 2016-06-27 DIAGNOSIS — I447 Left bundle-branch block, unspecified: Secondary | ICD-10-CM | POA: Diagnosis not present

## 2016-06-27 DIAGNOSIS — Z6830 Body mass index (BMI) 30.0-30.9, adult: Secondary | ICD-10-CM

## 2016-06-27 DIAGNOSIS — I872 Venous insufficiency (chronic) (peripheral): Secondary | ICD-10-CM | POA: Diagnosis not present

## 2016-06-27 DIAGNOSIS — D649 Anemia, unspecified: Secondary | ICD-10-CM

## 2016-06-27 DIAGNOSIS — E119 Type 2 diabetes mellitus without complications: Secondary | ICD-10-CM

## 2016-06-27 DIAGNOSIS — I5042 Chronic combined systolic (congestive) and diastolic (congestive) heart failure: Secondary | ICD-10-CM | POA: Diagnosis not present

## 2016-06-27 DIAGNOSIS — D126 Benign neoplasm of colon, unspecified: Secondary | ICD-10-CM

## 2016-06-27 DIAGNOSIS — J449 Chronic obstructive pulmonary disease, unspecified: Secondary | ICD-10-CM

## 2016-06-27 DIAGNOSIS — E782 Mixed hyperlipidemia: Secondary | ICD-10-CM | POA: Diagnosis not present

## 2016-06-27 DIAGNOSIS — J441 Chronic obstructive pulmonary disease with (acute) exacerbation: Secondary | ICD-10-CM

## 2016-06-27 DIAGNOSIS — M1990 Unspecified osteoarthritis, unspecified site: Secondary | ICD-10-CM

## 2016-06-27 DIAGNOSIS — I700 Atherosclerosis of aorta: Secondary | ICD-10-CM

## 2016-06-27 DIAGNOSIS — I2510 Atherosclerotic heart disease of native coronary artery without angina pectoris: Secondary | ICD-10-CM | POA: Diagnosis not present

## 2016-06-27 DIAGNOSIS — M545 Low back pain: Secondary | ICD-10-CM

## 2016-06-27 DIAGNOSIS — Z9689 Presence of other specified functional implants: Secondary | ICD-10-CM

## 2016-06-27 DIAGNOSIS — Z0000 Encounter for general adult medical examination without abnormal findings: Secondary | ICD-10-CM

## 2016-06-27 DIAGNOSIS — E6609 Other obesity due to excess calories: Secondary | ICD-10-CM

## 2016-06-27 DIAGNOSIS — K5730 Diverticulosis of large intestine without perforation or abscess without bleeding: Secondary | ICD-10-CM | POA: Diagnosis not present

## 2016-06-27 DIAGNOSIS — E559 Vitamin D deficiency, unspecified: Secondary | ICD-10-CM

## 2016-06-27 LAB — BASIC METABOLIC PANEL WITH GFR
BUN: 13 mg/dL (ref 7–25)
CALCIUM: 9.4 mg/dL (ref 8.6–10.3)
CO2: 28 mmol/L (ref 20–31)
CREATININE: 0.75 mg/dL (ref 0.70–1.18)
Chloride: 97 mmol/L — ABNORMAL LOW (ref 98–110)
GLUCOSE: 158 mg/dL — AB (ref 65–99)
Potassium: 4.4 mmol/L (ref 3.5–5.3)
SODIUM: 135 mmol/L (ref 135–146)

## 2016-06-27 LAB — HEPATIC FUNCTION PANEL
ALT: 8 U/L — AB (ref 9–46)
AST: 15 U/L (ref 10–35)
Albumin: 4 g/dL (ref 3.6–5.1)
Alkaline Phosphatase: 83 U/L (ref 40–115)
BILIRUBIN INDIRECT: 0.5 mg/dL (ref 0.2–1.2)
Bilirubin, Direct: 0.1 mg/dL (ref <=0.2)
TOTAL PROTEIN: 6.8 g/dL (ref 6.1–8.1)
Total Bilirubin: 0.6 mg/dL (ref 0.2–1.2)

## 2016-06-27 LAB — CBC WITH DIFFERENTIAL/PLATELET
BASOS ABS: 0 {cells}/uL (ref 0–200)
Basophils Relative: 0 %
EOS PCT: 5 %
Eosinophils Absolute: 225 cells/uL (ref 15–500)
HEMATOCRIT: 36.4 % — AB (ref 38.5–50.0)
HEMOGLOBIN: 12 g/dL — AB (ref 13.2–17.1)
Lymphocytes Relative: 19 %
Lymphs Abs: 855 cells/uL (ref 850–3900)
MCH: 27.5 pg (ref 27.0–33.0)
MCHC: 33 g/dL (ref 32.0–36.0)
MCV: 83.5 fL (ref 80.0–100.0)
MPV: 10.4 fL (ref 7.5–12.5)
Monocytes Absolute: 180 cells/uL — ABNORMAL LOW (ref 200–950)
Monocytes Relative: 4 %
NEUTROS PCT: 72 %
Neutro Abs: 3240 cells/uL (ref 1500–7800)
Platelets: 235 10*3/uL (ref 140–400)
RBC: 4.36 MIL/uL (ref 4.20–5.80)
RDW: 16 % — AB (ref 11.0–15.0)
WBC: 4.5 10*3/uL (ref 3.8–10.8)

## 2016-06-27 LAB — LIPID PANEL
CHOLESTEROL: 105 mg/dL (ref <200)
HDL: 27 mg/dL — ABNORMAL LOW (ref >40)
LDL Cholesterol: 56 mg/dL (ref <100)
TRIGLYCERIDES: 110 mg/dL (ref <150)
Total CHOL/HDL Ratio: 3.9 Ratio (ref <5.0)
VLDL: 22 mg/dL (ref <30)

## 2016-06-27 LAB — TSH: TSH: 2.65 mIU/L (ref 0.40–4.50)

## 2016-06-27 LAB — MAGNESIUM: Magnesium: 1.5 mg/dL (ref 1.5–2.5)

## 2016-06-27 MED ORDER — ALBUTEROL SULFATE HFA 108 (90 BASE) MCG/ACT IN AERS: 2.0000 | INHALATION_SPRAY | RESPIRATORY_TRACT | 0 refills | Status: DC | PRN

## 2016-06-27 MED ORDER — AZITHROMYCIN 250 MG PO TABS: ORAL_TABLET | ORAL | 1 refills | Status: AC

## 2016-06-27 NOTE — Progress Notes (Signed)
MEDICARE ANNUAL WELLNESS VISIT AND FOLLOW UP Assessment:   Essential hypertension - continue medications, DASH diet, exercise and monitor at home. Call if greater than 130/80.  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  Mixed hyperlipidemia -continue medications, check lipids, decrease fatty foods, increase activity.  -     Lipid panel  Medication management -     Magnesium  Medicare annual wellness visit, subsequent  Non-ischemic cardiomyopathy ([place]) Continue cardio follow up Weight stable  LBBB (left bundle branch block) Continue cardio follow up, stable  CAD in native artery Control blood pressure, cholesterol, glucose, increase exercise.   Venous (peripheral) insufficiency Weight loss advised, compression stockings  Diverticulosis of large intestine without hemorrhage Follow up GI, increase fiber  Benign neoplasm of colon, unspecified part of colon Follow up GI  Non-insulin treated type 2 diabetes mellitus ([place]) Discussed general issues about diabetes pathophysiology and management., Educational material distributed., Suggested low cholesterol diet., Encouraged aerobic exercise., Discussed foot care., Reminded to get yearly retinal exam.  RSD (reflex sympathetic dystrophy) Continue ortho/pain management follow up  Osteoarthritis, unspecified osteoarthritis type,  unspecified site Continue ortho/pain management follow up  Vitamin D deficiency Continue supplement  Spinal stenosis, multilevel Continue ortho/pain management follow up  Spinal cord stimulator status Continue ortho/pain management follow up  Class 1 obesity due to excess calories with serious comorbidity and body mass index (BMI) of 30.0 to 30.9 in adult  Chronic bilateral low back pain without sciatica Continue ortho/pain management follow up  Hypokalemia Check kidney function -     BASIC METABOLIC PANEL WITH GFR  Failed back syndrome of lumbar spine Continue ortho/pain management follow up  Chronic pain associated with significant psychosocial dysfunction Continue ortho/pain management follow up  Cervical arthritis ([place]) Continue ortho/pain management follow up Anemia, unspecified type -     CBC with Differential/Platelet  Atherosclerosis of aorta (HCC) Control blood pressure, cholesterol, glucose, increase exercise.  -     Lipid panel  COPD exacerbation ([place]) If any worsening SOB go to ER -     azithromycin (ZITHROMAX) 250 MG tablet; Take 2 tablets (500 mg) on  Day 1,  followed by 1 tablet (250 mg) once daily on Days 2 through 5. -     albuterol (VENTOLIN HFA) 108 (90 Base) MCG/ACT inhaler; Inhale 2 puffs into the lungs every 4 (four) hours as needed for wheezing or shortness of breath.  Polypharmacy Patient not driving at this time due to slow cognitive motor function from multiple medications, will try to decrease mediations.   Over 30 minutes of exam, counseling, chart review, and critical decision making was performed  Future Appointments Date Time Provider [place]  07/01/2016 3:30 PM [facility] PRE[facility] RM 51 [facility]  07/28/2016 10:00 AM Irene Shipper, MD [facility]  08/17/2016 11:00 AM [name], MD [facility]  09/29/2016 3:30 PM Unk Pinto, MD [facility] None  04/26/2017 10:00 AM Unk Pinto, MD [facility] None  Plan:   During the course of the visit the patient was educated and counseled about appropriate screening and preventive services including:    Pneumococcal vaccine   Influenza vaccine  Prevnar 13  Td vaccine  Screening electrocardiogram  Colorectal cancer screening  Diabetes screening  Glaucoma screening  Nutrition counseling    Subjective:  James Parsons is a 71 y.o. male who presents for Medicare Annual Wellness Visit and 3 month follow up for HTN, hyperlipidemia, diabetes, and vitamin D Def.   His blood pressure has been controlled at home, today their BP is BP: 134/70 He does not workout. He denies chest pain, shortness of breath, dizziness.  Has followed with Dr. [place], has chronic systolic/diastolic EF 93%, has nonobstructive CAD last cath 2017, has COPD at baseline.   Has been having left hip pain, left posterior hip at spinal cord stimulator for 3 days, worse with walking no pain with lying on it, no radiation. He has chronic pain, follows with Dr. Mechele Dawley, has had lumbar fusion with Dr. Rolena Infante 2016, has spinal stimulator and on narcotics from pain clinic. Not driving at this time due to sedation from pain meds.  He has been following with Dr. Henrene Pastor for dysphagia, and going to have EGD with dilitation.  Coughing and sinus issues x 4 days, not taking any OTC meds due to other medications.  He is on cholesterol medication and denies myalgias. His cholesterol is not at goal. The cholesterol last visit was:   Lab Results  Component Value Date   CHOL 105 06/27/2016   HDL 27 (L) 06/27/2016   LDLCALC 56 06/27/2016   LDLDIRECT 178.6 09/08/2009   TRIG 110 06/27/2016   CHOLHDL 3.9 06/27/2016   He has not been working on diet and exercise for Diabetes without complications x 2355, he is on bASA, he is on ACE/ARB, and denies polydipsia, polyuria and visual disturbances. Last A1C was:  Lab Results  Component Value Date   HGBA1C 9.5 (H) 04/22/2016    Lab Results   Component Value Date   GFRNONAA >89 06/27/2016   Patient is on Vitamin D supplement.   Lab Results  Component Value Date   VD25OH 36 03/23/2016     BMI is Body mass index is 30.98 kg/m., he is working on diet and exercise. Wt Readings from Last 3 Encounters:  06/27/16 197 lb 12.8 oz (89.7 kg)  05/17/16 194 lb 6.4 oz (88.2 kg)  05/16/16 196 lb (88.9 kg)    Medication Review: Current Outpatient Prescriptions on File Prior to Visit  Medication Sig Dispense Refill  . aspirin EC 81 MG EC tablet Take 1 tablet (81 mg total) by mouth daily. 30 tablet 0  . atorvastatin (LIPITOR) 80 MG tablet Take 1/2 to 1 tablet daily for cholesterol as directed 90 tablet 1  . carvedilol (COREG) 6.25 MG tablet Take 1 tablet (6.25 mg total) by mouth 2 (two) times daily with a meal. 30 tablet 3  . Cholecalciferol (VITAMIN D-3) 5000 UNITS TABS Take 5,000 Units by mouth 2 (two) times daily.    . cyclobenzaprine (FLEXERIL) 5 MG tablet Take 1 tablet (5 mg total) by mouth 3 (three) times daily as needed for muscle spasms. Muscle spasm.    . docusate sodium 100 MG CAPS Take 100 mg by mouth 2 (two) times daily. 10 capsule 0  . DULoxetine (CYMBALTA) 60 MG capsule Take 60 mg by mouth daily.     . folic acid (FOLVITE) 1 MG tablet Take 1 mg  by mouth daily.  1  . furosemide (LASIX) 20 MG tablet Take 1 tablet (20 mg total) by mouth daily. 30 tablet 0  . glimepiride (AMARYL) 4 MG tablet TAKE 1 TABLET TWICE DAILY WITH MEALS. DO NOT TAKE IF YOU DO NOT EAT. 180 tablet 1  . HYDROmorphone (DILAUDID) 2 MG tablet Take 2 mg by mouth every 8 (eight) hours as needed for severe pain.    [name] lidocaine (LIDODERM) 5 % Place 1 patch onto the skin daily as needed (pain).    [name] lisinopril (PRINIVIL,ZESTRIL) 2.5 MG tablet Take 1 tablet (2.5 mg total) by mouth daily. 30 tablet 0  . metFORMIN (GLUCOPHAGE) 500 MG tablet TAKE TWO TABLETS BY MOUTH TWICE DAILY 360 tablet 0  . methotrexate (RHEUMATREX) 2.5 MG tablet Take 15 mg by mouth once a week.  On Friday, 6 tablets  1  . metoCLOPramide (REGLAN) 10 MG tablet Take 1 tablet (10 mg total) by mouth 3 (three) times daily before meals. 270 tablet 1  . pantoprazole (PROTONIX) 40 MG tablet Take 1 tablet (40 mg total) by mouth 2 (two) times daily before a meal. 180 tablet 1  . potassium chloride SA (K-DUR,KLOR-CON) 20 MEQ tablet Take 1 tablet (20 mEq total) by mouth daily. 30 tablet 0  . PRESCRIPTION MEDICATION 0.5 mcg/mL by Pump Prime route continuous. Fentanyl 0.05mg /ml solution 5,000 mcg. Patient will bring dosage instructions with him. Daily dose 0.84mg .    . ranitidine (ZANTAC) 300 MG tablet TAKE 1 TABLET BY MOUTH AT BEDTIME. 90 tablet 0  . sucralfate (CARAFATE) 1 g tablet Take 1 tablet (1 g total) by mouth 4 (four) times daily. 360 tablet 3  . tamsulosin (FLOMAX) 0.4 MG CAPS capsule Take 1 capsule (0.4 mg total) by mouth daily after supper. 90 capsule 3  . triamcinolone cream (KENALOG) 0.1 % Apply 1 application topically 4 (four) times daily. 45 g 0  . pregabalin (LYRICA) 150 MG capsule Take 150 mg by mouth 2 (two) times daily.     No current facility-administered medications on file prior to visit.     Current Problems (verified) Patient Active Problem List   Diagnosis Date Noted  . CAD in native artery 05/17/2016  . LBBB (left bundle branch block) 05/17/2016  . Syncope and collapse 04/29/2016  . Non-ischemic cardiomyopathy ([place]) 04/29/2016  . Chronic combined systolic and diastolic HF (heart failure) ([place]) 04/29/2016  . Non-insulin treated type 2 diabetes mellitus ([place])   . Acute pulmonary edema (HCC)   . Hypokalemia   . Medicare annual wellness visit, subsequent 03/10/2015  . BMI 32.0-32.9,adult 03/10/2015  . RSD (reflex sympathetic dystrophy) 04/07/2014  . Obesity 04/07/2014  . Spinal cord stimulator status 03/31/2014  . Chronic pain associated with significant psychosocial dysfunction 10/24/2013  . Presence of other specified functional implants 10/24/2013  . Vitamin D  deficiency 09/18/2013  . Medication management 09/18/2013  . T2_NIDDM 11/02/2012  . Anemia 11/02/2012  . 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 Spinal stenosis, multilevel 05/17/2012  . Cervical pain 05/17/2012  . DJD (degenerative joint disease) 04/24/2012  . Essential hypertension 03/20/2010  . COPD (chronic obstructive pulmonary disease) ([place]) 03/09/2009  . Venous (peripheral) insufficiency 09/12/2008  . Diverticulosis of large intestine 09/12/2008  . COLONIC POLYPS 02/22/2007  . Mixed hyperlipidemia 02/22/2007  . LOW BACK PAIN SYNDROME 02/22/2007    Screening Tests Immunization History  Administered Date(s) Administered  . Influenza Split 04/22/2011, 01/20/2012  . Influenza Whole 01/15/2007, 01/21/2009, 03/08/2010  . Influenza, High Dose  Seasonal PF 02/07/2013, 12/10/2015  . Influenza-Unspecified 12/10/2013, 01/10/2015  . Pneumococcal Conjugate-13 06/09/2015  . Pneumococcal Polysaccharide-23 04/22/2011  . Tdap 04/11/2001, 05/07/2013    Preventative care: Last colonoscopy: 01/24/2013 Cath 04/2016 Echo 04/2016  Prior vaccinations: TD or Tdap: 2015  Influenza: 2016  Pneumococcal: 2017 Prevnar13: 2017 Shingles/Zostavax: declines  Names of Other Physician/Practitioners you currently use: 1. [facility] here for primary care 2. Dr. Earl Gala, eye doctor, last visit 2017, has OV April 30th 3. Has dentures , dentist Patient Care Team: Unk Pinto, MD as PCP - General (Internal Medicine) Sable Feil, MD as Consulting Physician (Gastroenterology) Clent Jacks, MD as Consulting Physician (Ophthalmology) Melina Schools, MD as Consulting Physician (Orthopedic Surgery) [place] L. Maryruth Eve, MD as Physician Assistant (Pain Medicine) Lennie Odor, MD as Referring Physician (Pain Medicine)  Allergies Allergies  Allergen Reactions  . Codeine Itching  . Morphine And Related Other (See Comments)    hallucinations     SURGICAL HISTORY He  has a past surgical history that includes Back surgery (4967,5916); morphine pump (2009); excision of lipoma from right olecranon area (11/2000); microdiscectomy and decompression (05/2002); C3-4 anterior cervical discectomy and fusion with plating at c3-4 (05/2006); spinal cord stimulator implanted; subcut pain pump implanted; Pain pump implantation; Tonsillectomy; Colonoscopy w/ polypectomy; Knee arthroscopy; Total knee arthroplasty (02/29/2012); Lumbar laminectomy/decompression microdiscectomy (N/A, 01/08/2014); Knee arthroscopy (Right, 06/06/2014); Colonoscopy; and Cardiac catheterization (N/A, 04/25/2016). FAMILY HISTORY His family history includes Cancer in his father. SOCIAL HISTORY He  reports that he quit smoking about 14 years ago. His smoking use included Cigarettes. He has a 120.00 pack-year smoking history. He has never used smokeless tobacco. He reports that he does not drink alcohol or use drugs.  MEDICARE WELLNESS OBJECTIVES: Physical activity: Current Exercise Habits: The patient does not participate in regular exercise at present, Exercise limited by: orthopedic condition(s) Cardiac risk factors: Cardiac Risk Factors include: advanced age (>53men, >74 women);diabetes mellitus;dyslipidemia;hypertension;male gender;sedentary lifestyle;obesity (BMI >30kg/m2);family history of premature cardiovascular disease Depression/mood screen:   Depression screen [facility] 2/9 06/27/2016  Decreased Interest 0  Down, Depressed, Hopeless 0  PHQ - 2 Score 0    ADLs:  In your present state of health, do you have any difficulty performing the following activities: 06/27/2016 05/02/2016  Hearing? N N  Vision? N N  Difficulty concentrating or making decisions? N N  Walking or climbing stairs? Y N  Dressing or bathing? N N  Doing errands, shopping? Y N  Some recent data might be hidden    Cognitive Testing  Alert? Yes  Normal Appearance?Yes  Oriented to person? Yes  Place? Yes    Time? Yes  Recall of three objects?  Yes  Can perform simple calculations? Yes  Displays appropriate judgment?Yes  Can read the correct time from a watch face?Yes  EOL planning: Does Patient Have a Medical Advance Directive?: Yes Type of Advance Directive: Healthcare Power of Attorney, Living will Copy of [place] in Chart?: No - copy requested   Objective:   Today's Vitals   06/27/16 1136  BP: 134/70  Pulse: 99  Resp: 16  Temp: 97.5 F (36.4 C)  SpO2: 95%  Weight: 197 lb 12.8 oz (89.7 kg)  Height: 5\' 7"  (1.702 m)  PainSc: 9   PainLoc: Hip   Body mass index is 30.98 kg/m.  General appearance: alert, no distress, WD/WN, male HEENT: normocephalic, sclerae anicteric, TMs pearly, nares patent, no discharge or erythema, pharynx normal Oral cavity: MMM, no lesions Neck:  supple, no lymphadenopathy, no thyromegaly, no masses Heart: RRR, normal S1, S2, no murmurs Lungs: CTA bilaterally with bilateral wheezing, no rhonchi, or rales Abdomen: +bs, rotund soft, non tender, non distended, no masses, no hepatomegaly, no splenomegaly Musculoskeletal: nontender, no swelling, no obvious deformity Extremities: no edema, no cyanosis, no clubbing Pulses: 2+ symmetric, upper and lower extremities, normal cap refill Neurological: alert, oriented x 3, CN2-12 intact, general psychomotor slowing/sedated, strength normal upper extremities.  Mildly weakened in lower extremities, sensation normal throughout, DTRs 2+ throughout, no cerebellar signs, gait shuffling and unstable with use of cane Psychiatric: normal affect, behavior normal, pleasant   Medicare Attestation I have personally reviewed: The patient's medical and social history Their use of alcohol, tobacco or illicit drugs Their current medications and supplements The patient's functional ability including ADLs,fall risks, home safety risks, cognitive, and hearing and visual impairment Diet and physical  activities Evidence for depression or mood disorders  The patient's weight, height, BMI, and visual acuity have been recorded in the chart.  I have made referrals, counseling, and provided education to the patient based on review of the above and I have provided the patient with a written personalized care plan for preventive services.     Vicie Mutters, PA-C   06/28/2016

## 2016-06-27 NOTE — Patient Instructions (Addendum)
Call insurance and find out what sugar strips they cover.    Your A1C is a measure of your sugar over the past 3 months and is not affected by what you have eaten over the past few days. Diabetes increases your chances of stroke and heart attack over 300 % and is the leading cause of blindness and kidney failure in the [place]. Please make sure you decrease bad carbs like white bread, white rice, potatoes, corn, soft drinks, pasta, cereals, refined sugars, sweet tea, dried fruits, and fruit juice. Good carbs are okay to eat in moderation like sweet potatoes, brown rice, whole grain pasta/bread, most fruit (except dried fruit) and you can eat as many veggies as you want.   Greater than 6.5 is considered diabetic. Between 6.4 and 5.7 is prediabetic If your A1C is less than 5.7 you are NOT diabetic.  Targets for Glucose Readings: Time of Check Target for patients WITHOUT Diabetes Target for DIABETICS  Before Meals Less than 100  less than 150  Two hours after meals Less than 200  Less than 250   Please be careful with glimepiride (Amaryl). This medication forces your blood sugar down no matter what it is starting at. Only take 1/2 of the medication if your sugar is above 150 and you can take a whole pill if your sugar is above 200 in the morning. If at any time you start to have low blood sugars in the morning or during the day please stop this medication. Please never take this medication if you are sick or can not eat. A low blood sugar is much more dangerous than a high blood sugar. Your brain needs two things, sugar and oxygen.    Diabetes is a very complicated disease...lets simplify it.  An easy way to look at it to understand the complications is if you think of the extra sugar floating in your blood stream as glass shards floating through your blood stream.    Diabetes affects your small vessels first: 1) The glass shards (sugar) scraps down the tiny blood vessels in your eyes and  lead to diabetic retinopathy, the leading cause of blindness in the [place]. Diabetes is the leading cause of newly diagnosed adult (32 to 71 years of age) blindness in the [place].  2) The glass shards scratches down the tiny vessels of your legs leading to nerve damage called neuropathy and can lead to amputations of your feet. More than 60% of all non-traumatic amputations of lower limbs occur in people with diabetes.  3) Over time the small vessels in your brain are shredded and closed off, individually this does not cause any problems but over a long period of time many of the small vessels being blocked can lead to Vascular Dementia.   4) Your kidney's are a filter system and have a "net" that keeps certain things in the body and lets bad things out. Sugar shreds this net and leads to kidney damage and eventually failure. Decreasing the sugar that is destroying the net and certain blood pressure medications can help stop or decrease progression of kidney disease. Diabetes was the primary cause of kidney failure in 44 percent of all new cases in 2011.  5) Diabetes also destroys the small vessels in your penis that lead to erectile dysfunction. Eventually the vessels are so damaged that you may not be responsive to cialis or viagra.   Diabetes and your large vessels: Your larger vessels consist of your coronary arteries  in your heart and the carotid vessels to your brain. Diabetes or even increased sugars put you at 300% increased risk of heart attack and stroke and this is why.. The sugar scrapes down your large blood vessels and your body sees this as an internal injury and tries to repair itself. Just like you get a scab on your skin, your platelets will stick to the blood vessel wall trying to heal it. This is why we have diabetics on low dose aspirin daily, this prevents the platelets from sticking and can prevent plaque formation. In addition, your body takes cholesterol and tries to shove it  into the open wound. This is why we want your LDL, or bad cholesterol, below 70.   The combination of platelets and cholesterol over 5-10 years forms plaque that can break off and cause a heart attack or stroke.   PLEASE REMEMBER:  Diabetes is preventable! Up to 63 percent of complications and morbidities among individuals with type 2 diabetes can be prevented, delayed, or effectively treated and minimized with regular visits to a health professional, appropriate monitoring and medication, and a healthy diet and lifestyle.

## 2016-06-28 DIAGNOSIS — I700 Atherosclerosis of aorta: Secondary | ICD-10-CM | POA: Insufficient documentation

## 2016-07-01 ENCOUNTER — Ambulatory Visit (AMBULATORY_SURGERY_CENTER): Payer: Self-pay

## 2016-07-01 VITALS — Ht 67.5 in | Wt 193.4 lb

## 2016-07-01 DIAGNOSIS — K222 Esophageal obstruction: Secondary | ICD-10-CM

## 2016-07-01 NOTE — Progress Notes (Signed)
No allergies to eggs or soy No diet meds No home oxygen No past problems with anesthesia  Declined emmi 

## 2016-07-07 ENCOUNTER — Other Ambulatory Visit: Payer: Self-pay | Admitting: Physician Assistant

## 2016-07-07 MED ORDER — DOXYCYCLINE HYCLATE 100 MG PO CAPS: ORAL_CAPSULE | ORAL | 0 refills | Status: DC

## 2016-07-07 NOTE — Progress Notes (Signed)
Sent in doxy

## 2016-07-14 ENCOUNTER — Encounter: Payer: Self-pay | Admitting: Internal Medicine

## 2016-07-25 ENCOUNTER — Encounter (HOSPITAL_COMMUNITY): Payer: Self-pay

## 2016-07-25 ENCOUNTER — Emergency Department (HOSPITAL_COMMUNITY): Payer: Medicare Other

## 2016-07-25 ENCOUNTER — Emergency Department (HOSPITAL_COMMUNITY)
Admission: EM | Admit: 2016-07-25 | Discharge: 2016-07-26 | Disposition: A | Discharge status: Home / Self Care | Payer: Medicare Other | Attending: Emergency Medicine | Admitting: Emergency Medicine

## 2016-07-25 DIAGNOSIS — R112 Nausea with vomiting, unspecified: Secondary | ICD-10-CM | POA: Diagnosis not present

## 2016-07-25 DIAGNOSIS — I2510 Atherosclerotic heart disease of native coronary artery without angina pectoris: Secondary | ICD-10-CM | POA: Diagnosis not present

## 2016-07-25 DIAGNOSIS — Z79899 Other long term (current) drug therapy: Secondary | ICD-10-CM | POA: Diagnosis not present

## 2016-07-25 DIAGNOSIS — Z96651 Presence of right artificial knee joint: Secondary | ICD-10-CM | POA: Diagnosis not present

## 2016-07-25 DIAGNOSIS — I509 Heart failure, unspecified: Secondary | ICD-10-CM | POA: Diagnosis not present

## 2016-07-25 DIAGNOSIS — Z7982 Long term (current) use of aspirin: Secondary | ICD-10-CM | POA: Diagnosis not present

## 2016-07-25 DIAGNOSIS — Z87891 Personal history of nicotine dependence: Secondary | ICD-10-CM | POA: Diagnosis not present

## 2016-07-25 DIAGNOSIS — J449 Chronic obstructive pulmonary disease, unspecified: Secondary | ICD-10-CM | POA: Diagnosis not present

## 2016-07-25 DIAGNOSIS — R1013 Epigastric pain: Secondary | ICD-10-CM | POA: Diagnosis present

## 2016-07-25 DIAGNOSIS — E119 Type 2 diabetes mellitus without complications: Secondary | ICD-10-CM | POA: Diagnosis not present

## 2016-07-25 DIAGNOSIS — I110 Hypertensive heart disease with heart failure: Secondary | ICD-10-CM | POA: Diagnosis not present

## 2016-07-25 LAB — LIPASE, BLOOD: Lipase: 18 U/L (ref 11–51)

## 2016-07-25 LAB — I-STAT CHEM 8, ED
BUN: 10 mg/dL (ref 6–20)
CHLORIDE: 101 mmol/L (ref 101–111)
CREATININE: 0.6 mg/dL — AB (ref 0.61–1.24)
Calcium, Ion: 1 mmol/L — ABNORMAL LOW (ref 1.15–1.40)
GLUCOSE: 144 mg/dL — AB (ref 65–99)
HCT: 37 % — ABNORMAL LOW (ref 39.0–52.0)
Hemoglobin: 12.6 g/dL — ABNORMAL LOW (ref 13.0–17.0)
Potassium: 5.6 mmol/L — ABNORMAL HIGH (ref 3.5–5.1)
Sodium: 134 mmol/L — ABNORMAL LOW (ref 135–145)
TCO2: 27 mmol/L (ref 0–100)

## 2016-07-25 LAB — CBC WITH DIFFERENTIAL/PLATELET
BASOS PCT: 0 %
Basophils Absolute: 0 10*3/uL (ref 0.0–0.1)
EOS ABS: 0.2 10*3/uL (ref 0.0–0.7)
Eosinophils Relative: 2 %
HEMATOCRIT: 38.5 % — AB (ref 39.0–52.0)
HEMOGLOBIN: 12.8 g/dL — AB (ref 13.0–17.0)
Lymphocytes Relative: 24 %
Lymphs Abs: 2.5 10*3/uL (ref 0.7–4.0)
MCH: 27 pg (ref 26.0–34.0)
MCHC: 33.2 g/dL (ref 30.0–36.0)
MCV: 81.2 fL (ref 78.0–100.0)
MONO ABS: 0.6 10*3/uL (ref 0.1–1.0)
MONOS PCT: 5 %
NEUTROS ABS: 7.4 10*3/uL (ref 1.7–7.7)
NEUTROS PCT: 69 %
Platelets: 287 10*3/uL (ref 150–400)
RBC: 4.74 MIL/uL (ref 4.22–5.81)
RDW: 17.1 % — AB (ref 11.5–15.5)
WBC: 10.6 10*3/uL — ABNORMAL HIGH (ref 4.0–10.5)

## 2016-07-25 LAB — COMPREHENSIVE METABOLIC PANEL
ALBUMIN: 3.9 g/dL (ref 3.5–5.0)
ALK PHOS: 83 U/L (ref 38–126)
ALT: 25 U/L (ref 17–63)
AST: 53 U/L — ABNORMAL HIGH (ref 15–41)
Anion gap: 13 (ref 5–15)
BUN: 9 mg/dL (ref 6–20)
CO2: 24 mmol/L (ref 22–32)
CREATININE: 0.68 mg/dL (ref 0.61–1.24)
Calcium: 9.4 mg/dL (ref 8.9–10.3)
Chloride: 98 mmol/L — ABNORMAL LOW (ref 101–111)
GFR calc Af Amer: >60 mL/min (ref >60)
GLUCOSE: 142 mg/dL — AB (ref 65–99)
Potassium: 6.2 mmol/L — ABNORMAL HIGH (ref 3.5–5.1)
Sodium: 135 mmol/L (ref 135–145)
Total Bilirubin: 1.9 mg/dL — ABNORMAL HIGH (ref 0.3–1.2)
Total Protein: 6.9 g/dL (ref 6.5–8.1)

## 2016-07-25 LAB — I-STAT TROPONIN, ED: Troponin i, poc: 0.01 ng/mL (ref 0.00–0.08)

## 2016-07-25 LAB — I-STAT CG4 LACTIC ACID, ED: Lactic Acid, Venous: 1.3 mmol/L (ref 0.5–1.9)

## 2016-07-25 LAB — BRAIN NATRIURETIC PEPTIDE: B Natriuretic Peptide: 291.4 pg/mL — ABNORMAL HIGH (ref 0.0–100.0)

## 2016-07-25 MED ORDER — LIDOCAINE VISCOUS 2 % MT SOLN
15.0000 mL | Freq: Once | OROMUCOSAL | Status: AC
Start: 2016-07-25 — End: 2016-07-25
  Administered 2016-07-25: 15 mL via OROMUCOSAL
  Filled 2016-07-25: qty 15

## 2016-07-25 MED ORDER — ALUM & MAG HYDROXIDE-SIMETH 200-200-20 MG/5ML PO SUSP
30.0000 mL | Freq: Once | ORAL | Status: AC
  Administered 2016-07-25: 30 mL via ORAL
  Filled 2016-07-25: qty 30

## 2016-07-25 MED ORDER — ONDANSETRON HCL 4 MG/2ML IJ SOLN
4.0000 mg | Freq: Once | INTRAMUSCULAR | Status: AC
  Administered 2016-07-25: 4 mg via INTRAVENOUS
  Filled 2016-07-25: qty 2

## 2016-07-25 MED ORDER — SODIUM CHLORIDE 0.9 % IV BOLUS (SEPSIS)
1000.0000 mL | Freq: Once | INTRAVENOUS | Status: AC
  Administered 2016-07-25: 1000 mL via INTRAVENOUS

## 2016-07-25 MED ORDER — IOPAMIDOL (ISOVUE-300) INJECTION 61%
INTRAVENOUS | Status: AC
  Administered 2016-07-25: 100 mL
  Filled 2016-07-25: qty 100

## 2016-07-25 MED ORDER — HYDROMORPHONE HCL 1 MG/ML IJ SOLN
1.0000 mg | Freq: Once | INTRAMUSCULAR | Status: AC
  Administered 2016-07-26: 1 mg via INTRAVENOUS
  Filled 2016-07-25: qty 1

## 2016-07-25 NOTE — ED Notes (Signed)
Patient transported to CT 

## 2016-07-25 NOTE — ED Provider Notes (Signed)
[facility] DEPT Provider Note   CSN: [id number] Arrival date & time: 07/25/16  2133     History   Chief Complaint Chief Complaint  Patient presents with  . Abdominal Pain    Epigastric    HPI James Parsons is a 71 y.o. male.  The history is provided by the patient.  Abdominal Pain   This is a new problem. The current episode started yesterday. The problem occurs constantly. The problem has been gradually worsening. The pain is associated with eating (No recent sick contacts, no suspcious food intake). The pain is located in the epigastric region. The quality of the pain is aching. The pain is at a severity of 5/10. The pain is moderate. Associated symptoms include anorexia, nausea and vomiting. Pertinent negatives include fever, belching, diarrhea, flatus, hematochezia, melena, constipation, dysuria, frequency, hematuria, headaches, arthralgias and myalgias. The symptoms are aggravated by palpation and eating. Nothing relieves the symptoms. Past workup does not include CT scan. His past medical history is significant for GERD.    Past Medical History:  Diagnosis Date  . Allergy   . Anemia   . Arthritis   . Borderline hypertension   . CHF (congestive heart failure) ([place])   . Clotting disorder (HCC)    bleeds easily-no dx  . COPD (chronic obstructive pulmonary disease) ([place])    denies  . Depression    hx of   . Diabetes mellitus   . Diverticulosis of colon   . Full dentures   . GERD (gastroesophageal reflux disease)   . Hx of colonic polyps   . Hyperlipidemia   . Hypertension   . Lipoma   . Low back pain   . Mental disorder   . Obesity   . Pneumonia    hx  . RSD (reflex sympathetic dystrophy)   . Venous insufficiency     Patient Active Problem List   Diagnosis Date Noted  . Atherosclerosis of aorta ([place]) 06/28/2016  . CAD in native artery 05/17/2016  . LBBB (left bundle branch block) 05/17/2016  . Non-ischemic cardiomyopathy ([place]) 04/29/2016  . Chronic  combined systolic and diastolic HF (heart failure) ([place]) 04/29/2016  . Non-insulin treated type 2 diabetes mellitus ([place])   . Hypokalemia   . Medicare annual wellness visit, subsequent 03/10/2015  . RSD (reflex sympathetic dystrophy) 04/07/2014  . Obesity 04/07/2014  . Spinal cord stimulator status 03/31/2014  . Chronic pain associated with significant psychosocial dysfunction 10/24/2013  . Vitamin D deficiency 09/18/2013  . Medication management 09/18/2013  . T2_NIDDM 11/02/2012  . Anemia 11/02/2012  . 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 Spinal stenosis, multilevel 05/17/2012  . Cervical arthritis ([place]) 05/17/2012  . DJD (degenerative joint disease) 04/24/2012  . Essential hypertension 03/20/2010  . COPD (chronic obstructive pulmonary disease) ([place]) 03/09/2009  . Venous (peripheral) insufficiency 09/12/2008  . Diverticulosis of large intestine 09/12/2008  . COLONIC POLYPS 02/22/2007  . Mixed hyperlipidemia 02/22/2007  . LOW BACK PAIN SYNDROME 02/22/2007    Past Surgical History:  Procedure Laterality Date  . BACK SURGERY  2005,2007  . C3-4 anterior cervical discectomy and fusion with plating at c3-4  05/2006   Dr. Arnoldo Morale  . CARDIAC CATHETERIZATION N/A 04/25/2016   Procedure: Right/Left Heart Cath and Coronary Angiography;  Surgeon: Peter M Martinique, MD;  Location: [facility];  Service: Cardiovascular;  Laterality: N/A;  . COLONOSCOPY    . COLONOSCOPY W/ POLYPECTOMY    . excision of lipoma from right olecranon area  11/2000   Dr. Rise [name]  . KNEE ARTHROSCOPY     right knee  . KNEE ARTHROSCOPY Right 06/06/2014   Procedure: ARTHROSCOPY RIGHT KNEE WITH REMOVAL OF FIBROUS BANDS;  Surgeon: Kerin Salen, MD;  Location: [facility];  Service: Orthopedics;  Laterality: Right;  . LUMBAR LAMINECTOMY/DECOMPRESSION MICRODISCECTOMY N/A 01/08/2014   Procedure: L4-S1 Decompression with removal and reimplantation of spinal cord stimulator battery ;  Surgeon:  Melina Schools, MD;  Location: [facility];  Service: Orthopedics;  Laterality: N/A;  . microdiscectomy and decompression  05/2002   Dr. Tonita Cong  . morphine pump  2009   due to reaction to morphine/changed to fentanyl  . PAIN PUMP IMPLANTATION     with fentanyl  . spinal cord stimulator implanted     for pain per Dr. Maryruth Eve  . subcut pain pump implanted    . TONSILLECTOMY    . TOTAL KNEE ARTHROPLASTY  02/29/2012   Procedure: TOTAL KNEE ARTHROPLASTY;  Surgeon: Kerin Salen, MD;  Location: [facility];  Service: Orthopedics;  Laterality: Right;       Home Medications    Prior to Admission medications   Medication Sig Start Date End Date Taking? Authorizing Provider  albuterol (VENTOLIN HFA) 108 (90 Base) MCG/ACT inhaler Inhale 2 puffs into the lungs every 4 (four) hours as needed for wheezing or shortness of breath. 06/27/16   Vicie Mutters, PA-C  aspirin EC 81 MG EC tablet Take 1 tablet (81 mg total) by mouth daily. 04/27/16   Modena Jansky, MD  atorvastatin (LIPITOR) 80 MG tablet Take 1/2 to 1 tablet daily for cholesterol as directed Patient taking differently: 80 mg. Take 1 tablet daily for cholesterol as directed 03/29/16 09/27/16  Unk Pinto, MD  carvedilol (COREG) 6.25 MG tablet Take 1 tablet (6.25 mg total) by mouth 2 (two) times daily with a meal. 05/17/16   Erlene Quan, PA-C  Cholecalciferol (VITAMIN D-3) 5000 UNITS TABS Take 5,000 Units by mouth 2 (two) times daily.    Historical Provider, MD  cyclobenzaprine (FLEXERIL) 5 MG tablet Take 1 tablet (5 mg total) by mouth 3 (three) times daily as needed for muscle spasms. Muscle spasm. 05/01/16   Thurnell Lose, MD  docusate sodium 100 MG CAPS Take 100 mg by mouth 2 (two) times daily. 04/09/14   Shanker Kristeen Mans, MD  doxycycline (VIBRAMYCIN) 100 MG capsule Take 1 capsule twice daily with food 07/07/16   Vicie Mutters, PA-C  DULoxetine (CYMBALTA) 60 MG capsule Take 60 mg by mouth daily.  10/09/12   Historical Provider, MD  folic acid  (FOLVITE) 1 MG tablet Take 1 mg by mouth daily. 08/13/14   Historical Provider, MD  furosemide (LASIX) 20 MG tablet Take 1 tablet (20 mg total) by mouth daily. 05/01/16   Thurnell Lose, MD  glimepiride (AMARYL) 4 MG tablet TAKE 1 TABLET TWICE DAILY WITH MEALS. DO NOT TAKE IF YOU DO NOT EAT. 06/26/16   Unk Pinto, MD  HYDROmorphone (DILAUDID) 2 MG tablet Take 2 mg by mouth every 8 (eight) hours as needed for severe pain.    Historical Provider, MD  lidocaine (LIDODERM) 5 % Place 1 patch onto the skin daily as needed (pain). 04/26/16   Modena Jansky, MD  metFORMIN (GLUCOPHAGE) 500 MG tablet TAKE TWO TABLETS BY MOUTH TWICE DAILY 05/20/16   Unk Pinto, MD  methotrexate (RHEUMATREX) 2.5 MG tablet Take 15 mg by mouth once a week. On Friday, 6 tablets 08/13/14   Historical Provider,  MD  metoCLOPramide (REGLAN) 10 MG tablet Take 1 tablet (10 mg total) by mouth 3 (three) times daily before meals. 05/04/16   Vicie Mutters, PA-C  ondansetron (ZOFRAN ODT) 4 MG disintegrating tablet Take 1 tablet (4 mg total) by mouth every 8 (eight) hours as needed for nausea or vomiting. 07/26/16   Esaw Grandchild, MD  pantoprazole (PROTONIX) 40 MG tablet Take 1 tablet (40 mg total) by mouth 2 (two) times daily before a meal. 05/05/16 05/05/17  Irene Shipper, MD  pregabalin (LYRICA) 150 MG capsule Take 150 mg by mouth 2 (two) times daily. 04/07/16 06/06/16  Historical Provider, MD  PRESCRIPTION MEDICATION 0.5 mcg/mL by Pump Prime route continuous. Fentanyl 0.05mg /ml solution 5,000 mcg. Patient will bring dosage instructions with him. Daily dose 0.84mg .    Historical Provider, MD  ranitidine (ZANTAC) 300 MG tablet TAKE 1 TABLET BY MOUTH AT BEDTIME. 05/20/16   Unk Pinto, MD  sucralfate (CARAFATE) 1 g tablet Take 1 tablet (1 g total) by mouth 4 (four) times daily. 05/04/16 05/04/17  Vicie Mutters, PA-C  tamsulosin (FLOMAX) 0.4 MG CAPS capsule Take 1 capsule (0.4 mg total) by mouth daily after supper. 08/20/15   Starlyn Skeans,  PA-C    Family History Family History  Problem Relation Age of Onset  . Cancer Father   . Colon cancer Neg Hx   . Esophageal cancer Neg Hx   . Stomach cancer Neg Hx   . Rectal cancer Neg Hx     Social History Social History  Substance Use Topics  . Smoking status: Former Smoker    Packs/day: 3.00    Years: 40.00    Types: Cigarettes    Quit date: 05/25/2002  . Smokeless tobacco: Never Used  . Alcohol use No     Allergies   Codeine and Morphine and related   Review of Systems Review of Systems  Constitutional: Negative for chills and fever.  HENT: Negative for ear pain and sore throat.   Eyes: Negative for pain and visual disturbance.  Respiratory: Negative for cough and shortness of breath.   Cardiovascular: Negative for chest pain and palpitations.  Gastrointestinal: Positive for abdominal pain, anorexia, nausea and vomiting. Negative for constipation, diarrhea, flatus, hematochezia and melena.  Genitourinary: Negative for dysuria, frequency and hematuria.  Musculoskeletal: Negative for arthralgias, back pain and myalgias.  Skin: Negative for color change and rash.  Neurological: Negative for seizures, syncope and headaches.  All other systems reviewed and are negative.    Physical Exam Updated Vital Signs BP 113/63   Pulse (!) 102   Resp 15   Ht 5\' 9"  (1.753 m)   Wt 84.8 kg   SpO2 96%   BMI 27.62 kg/m   Physical Exam  Constitutional: He appears well-developed and well-nourished. He appears distressed.  HENT:  Head: Normocephalic and atraumatic.  Eyes: Conjunctivae are normal.  Neck: Neck supple.  Cardiovascular: Normal rate and regular rhythm.   No murmur heard. Pulmonary/Chest: Effort normal and breath sounds normal. No respiratory distress.  Abdominal: Soft. There is tenderness in the epigastric area. There is no rigidity, no rebound and no guarding.  Musculoskeletal: He exhibits no edema.  Neurological: He is alert. No cranial nerve deficit or  sensory deficit. GCS eye subscore is 4. GCS verbal subscore is 5. GCS motor subscore is 6.  Skin: Skin is warm and dry. No rash noted.  Psychiatric: He has a normal mood and affect.  Nursing note and vitals reviewed.    ED Treatments /  Results  Labs (all labs ordered are listed, but only abnormal results are displayed) Labs Reviewed  CBC WITH DIFFERENTIAL/PLATELET - Abnormal; Notable for the following:       Result Value   WBC 10.6 (*)    Hemoglobin 12.8 (*)    HCT 38.5 (*)    RDW 17.1 (*)    All other components within normal limits  COMPREHENSIVE METABOLIC PANEL - Abnormal; Notable for the following:    Potassium 6.2 (*)    Chloride 98 (*)    Glucose, Bld 142 (*)    AST 53 (*)    Total Bilirubin 1.9 (*)    All other components within normal limits  BRAIN NATRIURETIC PEPTIDE - Abnormal; Notable for the following:    B Natriuretic Peptide 291.4 (*)    All other components within normal limits  I-STAT CHEM 8, ED - Abnormal; Notable for the following:    Sodium 134 (*)    Potassium 5.6 (*)    Creatinine, Ser 0.60 (*)    Glucose, Bld 144 (*)    Calcium, Ion 1.00 (*)    Hemoglobin 12.6 (*)    HCT 37.0 (*)    All other components within normal limits  I-STAT CHEM 8, ED - Abnormal; Notable for the following:    Glucose, Bld 132 (*)    Calcium, Ion 1.04 (*)    Hemoglobin 11.2 (*)    HCT 33.0 (*)    All other components within normal limits  LIPASE, BLOOD  I-STAT CG4 LACTIC ACID, ED  I-STAT TROPOININ, ED  I-STAT CG4 LACTIC ACID, ED  I-STAT TROPOININ, ED    EKG  EKG Interpretation  Date/Time:  Monday July 25 2016 21:38:52 EDT Ventricular Rate:  99 PR Interval:    QRS Duration: 156 QT Interval:  407 QTC Calculation: 523 R Axis:   8 Text Interpretation:  Sinus rhythm Left bundle branch block No significant change since last tracing Confirmed by LITTLE MD, RACHEL [id number]) on 07/25/2016 10:45:23 PM       Radiology Ct Abdomen Pelvis W Contrast  Addendum Date:  07/26/2016   ADDENDUM REPORT: 07/26/2016 00:10 ADDENDUM: Multiple healing right anterolateral lower rib fractures. Electronically Signed   By: Donavan Foil M.D.   On: 07/26/2016 00:10   Result Date: 07/26/2016 CLINICAL DATA:  Epigastric pain with vomiting for 2 days EXAM: CT ABDOMEN AND PELVIS WITH CONTRAST TECHNIQUE: Multidetector CT imaging of the abdomen and pelvis was performed using the standard protocol following bolus administration of intravenous contrast. CONTRAST:  111mL ISOVUE-300 IOPAMIDOL (ISOVUE-300) INJECTION 61% COMPARISON:  04/22/2016, CT 06/16/2015 FINDINGS: Lower chest: Lung bases demonstrate no acute consolidation or pleural effusion. Trace pericardial effusion. Borderline heart size. Hepatobiliary: Hepatic steatosis. No focal hepatic abnormality. No biliary dilatation or calcified gallstone Pancreas: Unremarkable. No pancreatic ductal dilatation or surrounding inflammatory changes. Spleen: Small hyperdense focus/ probable prominent vascular enhancement in the superior spleen is unchanged. Adrenals/Urinary Tract: Adrenal glands are within normal limits. Stable 1.6 cm cyst midpole left kidney. The bladder is dilated Stomach/Bowel: The stomach is nonenlarged. No evidence for bowel obstruction. Few sigmoid colon diverticula without acute inflammation. Normal appendix. Vascular/Lymphatic: Aortic atherosclerosis. No enlarged abdominal or pelvic lymph nodes. Reproductive: Coarse calcification in the prostate gland Other: Fat in the right greater than left inguinal canals. No free air or free fluid. Musculoskeletal: Postsurgical changes at L5/S1 with evidence of laminectomy. Bilateral stimulator generator is with intraspinal stimulator leads again visualized. IMPRESSION: 1. No CT evidence for acute intra-abdominal or pelvic pathology  2. Hepatic steatosis 3. Mild colon diverticula without acute inflammation Electronically Signed: By: Donavan Foil M.D. On: 07/26/2016 00:05    Procedures Procedures  (including critical care time)  Medications Ordered in ED Medications  ondansetron (ZOFRAN) injection 4 mg (4 mg Intravenous Given 07/25/16 2250)  sodium chloride 0.9 % bolus 1,000 mL (0 mLs Intravenous Stopped 07/26/16 0037)  alum & mag hydroxide-simeth (MAALOX/MYLANTA) 200-200-20 MG/5ML suspension 30 mL (30 mLs Oral Given 07/25/16 2250)  lidocaine (XYLOCAINE) 2 % viscous mouth solution 15 mL (15 mLs Mouth/Throat Given 07/25/16 2250)  iopamidol (ISOVUE-300) 61 % injection (100 mLs  Contrast Given 07/25/16 2338)  HYDROmorphone (DILAUDID) injection 1 mg (1 mg Intravenous Given 07/26/16 0001)     Initial Impression / Assessment and Plan / ED Course  I have reviewed the triage vital signs and the nursing notes.  Pertinent labs & imaging results that were available during my care of the patient were reviewed by me and considered in my medical decision making (see chart for details).     71 year old white male with history of diabetes, hypertension, COPD, GERD and presents in the setting of epigastric discomfort and nausea and vomiting. Patient reported nausea and vomiting started yesterday and has continued since that time. Patient's had worsening epigastric discomfort. He additionally reports some mild chest burning with this. Patient denies recent sick contacts or recent travel.  On arrival patient hemodynamically stable and afebrile. Patient with tenderness to palpation of epigastric region. No tenderness right upper quadrant. No significant chest pain or shortness of breath noted. EKG unchanged from previous EKG. Laboratory analysis without significant electrolyte abnormalities, signs of pancreatitis, liver abnormalities, elevation in troponin, significant change in BNP. Patient was given IV fluids and anti-medics with improvement in symptoms. Additionally patient given IV pain medication with improvement in symptoms. Patient is on chronic pain medication for back pain.  CT scan obtained which  revealed no acute intra-abdominal pathology. On reassessment patient continued to be improving and I have very low suspicion for ACS, aortic dissection or acute surgical abdominal abnormality to cause of patient's complaint. Likely gastrointestinal illness. Patient tolerating by mouth intake and believe patient safe for discharge home with short course of anti-medics and plan to follow up PCP for further management as condition. Patient stable at time of discharge and in agreement with plan.  Final Clinical Impressions(s) / ED Diagnoses   Final diagnoses:  Nausea and vomiting, intractability of vomiting not specified, unspecified vomiting type  Epigastric pain    New Prescriptions New Prescriptions   ONDANSETRON (ZOFRAN ODT) 4 MG DISINTEGRATING TABLET    Take 1 tablet (4 mg total) by mouth every 8 (eight) hours as needed for nausea or vomiting.     Esaw Grandchild, MD 07/26/16 [place], MD 08/02/16 [phone]

## 2016-07-25 NOTE — ED Triage Notes (Signed)
Pt with hx of GERD via EMS from home with reports of epigastric abd pain since 1300 yesterday afternoon. Per EMS, pt reports burning epigastric and throat pain that became worse with eating. Per EMS, pt reported 3-4 episodes of vomiting which relieved his pain. Pt given 4 mg zofran en route. Pt scheduled for endoscopy in a few days to stretch his esophagus. Per EMS, LBBB on 12-lead, 140/100, 24 RR shallow with clear breath sounds, 99% on RA, 116 bpm. 22G in R hand. Pt A&Ox4. Reports 2/10 CP at this time.

## 2016-07-25 NOTE — ED Notes (Addendum)
CT called regarding pt scan, will transport pt when labs result.

## 2016-07-25 NOTE — ED Notes (Signed)
Pt returned from CT °

## 2016-07-25 NOTE — ED Notes (Signed)
Explained to pt need for urine sample, pt reports he does not need to void at this time but after his fluids he will attempt to urinate.

## 2016-07-25 NOTE — ED Notes (Signed)
Followed up with CT, per CT pt will be transported shortly.

## 2016-07-26 LAB — I-STAT CG4 LACTIC ACID, ED: Lactic Acid, Venous: 1.88 mmol/L (ref 0.5–1.9)

## 2016-07-26 LAB — I-STAT CHEM 8, ED
BUN: 7 mg/dL (ref 6–20)
CHLORIDE: 101 mmol/L (ref 101–111)
Calcium, Ion: 1.04 mmol/L — ABNORMAL LOW (ref 1.15–1.40)
Creatinine, Ser: 0.7 mg/dL (ref 0.61–1.24)
Glucose, Bld: 132 mg/dL — ABNORMAL HIGH (ref 65–99)
HEMATOCRIT: 33 % — AB (ref 39.0–52.0)
Hemoglobin: 11.2 g/dL — ABNORMAL LOW (ref 13.0–17.0)
Potassium: 4 mmol/L (ref 3.5–5.1)
Sodium: 137 mmol/L (ref 135–145)
TCO2: 30 mmol/L (ref 0–100)

## 2016-07-26 LAB — I-STAT TROPONIN, ED: TROPONIN I, POC: 0 ng/mL (ref 0.00–0.08)

## 2016-07-26 MED ORDER — ONDANSETRON 4 MG PO TBDP: 4.0000 mg | ORAL_TABLET | Freq: Three times a day (TID) | ORAL | 0 refills | Status: DC | PRN

## 2016-07-26 NOTE — ED Notes (Signed)
ED Provider at bedside. 

## 2016-07-26 NOTE — ED Notes (Signed)
Phlebotomy at bedside.

## 2016-07-28 ENCOUNTER — Encounter: Payer: Self-pay | Admitting: Internal Medicine

## 2016-07-28 ENCOUNTER — Ambulatory Visit (AMBULATORY_SURGERY_CENTER): Payer: Medicare Other | Admitting: Internal Medicine

## 2016-07-28 VITALS — BP 107/17 | HR 84 | Temp 98.4°F | Resp 16 | Ht 67.5 in | Wt 193.0 lb

## 2016-07-28 DIAGNOSIS — K222 Esophageal obstruction: Secondary | ICD-10-CM

## 2016-07-28 DIAGNOSIS — K2980 Duodenitis without bleeding: Secondary | ICD-10-CM | POA: Diagnosis not present

## 2016-07-28 DIAGNOSIS — R1310 Dysphagia, unspecified: Secondary | ICD-10-CM

## 2016-07-28 DIAGNOSIS — K209 Esophagitis, unspecified without bleeding: Secondary | ICD-10-CM

## 2016-07-28 MED ORDER — SODIUM CHLORIDE 0.9 % IV SOLN
500.0000 mL | INTRAVENOUS | Status: DC
Start: 2016-07-28 — End: 2016-09-08

## 2016-07-28 NOTE — Progress Notes (Signed)
Dr. Henrene Pastor aware that his next available appointment is 09-06-16.

## 2016-07-28 NOTE — Progress Notes (Signed)
Called to room to assist during endoscopic procedure.  Patient ID and intended procedure confirmed with present staff. Received instructions for my participation in the procedure from the performing physician.  

## 2016-07-28 NOTE — Patient Instructions (Signed)
YOU HAD AN ENDOSCOPIC PROCEDURE TODAY AT [facility]:   Refer to the procedure report that was given to you for any specific questions about what was found during the examination.  If the procedure report does not answer your questions, please call your gastroenterologist to clarify.  If you requested that your care partner not be given the details of your procedure findings, then the procedure report has been included in a sealed envelope for you to review at your convenience later.  YOU SHOULD EXPECT: Some feelings of bloating in the abdomen. Passage of more gas than usual.  Walking can help get rid of the air that was put into your GI tract during the procedure and reduce the bloating.  Please Note:  You might notice some irritation and congestion in your nose or some drainage.  This is from the oxygen used during your procedure.  There is no need for concern and it should clear up in a day or so.  SYMPTOMS TO REPORT IMMEDIATELY:  Following upper endoscopy (EGD)  Vomiting of blood or coffee ground material  New chest pain or pain under the shoulder blades  Painful or persistently difficult swallowing  New shortness of breath  Fever of 100F or higher  Black, tarry-looking stools  For urgent or emergent issues, a gastroenterologist can be reached at any hour by calling [phone].   DIET:  FOLLOW DILATION DIET- SEE HANDOUTS Drink plenty of fluids but you should avoid alcoholic beverages for 24 hours.  ACTIVITY:  You should plan to take it easy for the rest of today and you should NOT DRIVE or use heavy machinery until tomorrow (because of the sedation medicines used during the test).    FOLLOW UP: Our staff will call the number listed on your records the next business day following your procedure to check on you and address any questions or concerns that you may have regarding the information given to you following your procedure. If we do not reach you, we will leave a  message.  However, if you are feeling well and you are not experiencing any problems, there is no need to return our call.  We will assume that you have returned to your regular daily activities without incident.  If any biopsies were taken you will be contacted by phone or by letter within the next 1-3 weeks.  Please call us at [phone] if you have not heard about the biopsies in 3 weeks.   SIGNATURES/CONFIDENTIALITY: You and/or your care partner have signed paperwork which will be entered into your electronic medical record.  These signatures attest to the fact that that the information above on your After Visit Summary has been reviewed and is understood.  Full responsibility of the confidentiality of this discharge information lies with you and/or your care-partner.  CONTINUE YOUR MEDICATIONS, INCLUDING PANTOPRAZOLE TWICE DAILY- 30 MINUTES BEFORE BREAKFAST AND DINNER  Await pathology  Continue your normal medications

## 2016-07-28 NOTE — Progress Notes (Signed)
Pt's states no medical or surgical changes since previsit or office visit. 

## 2016-07-28 NOTE — Progress Notes (Signed)
Report to PACU, RN, vss, BBS= Clear.  

## 2016-07-28 NOTE — Op Note (Signed)
[facility] Patient Name: James Parsons Procedure Date: 07/28/2016 10:12 AM MRN: [id number] Endoscopist: Docia Chuck. Henrene Pastor , MD Age: 71 Referring MD:  Date of Birth: [birth year] Gender: Male Account #: [id number] Procedure:                Upper GI endoscopy, with biopsies and balloon                            dilation of esophagus Indications:              Dysphagia, Therapeutic procedure. Last exam                            December 2017 (see report) Medicines:                Monitored Anesthesia Care Procedure:                Pre-Anesthesia Assessment:                           - Prior to the procedure, a History and Physical                            was performed, and patient medications and                            allergies were reviewed. The patient's tolerance of                            previous anesthesia was also reviewed. The risks                            and benefits of the procedure and the sedation                            options and risks were discussed with the patient.                            All questions were answered, and informed consent                            was obtained. Prior Anticoagulants: The patient has                            taken no previous anticoagulant or antiplatelet                            agents. ASA Grade Assessment: II - A patient with                            mild systemic disease. After reviewing the risks                            and benefits, the patient was deemed in  satisfactory condition to undergo the procedure.                           After obtaining informed consent, the endoscope was                            passed under direct vision. Throughout the                            procedure, the patient's blood pressure, pulse, and                            oxygen saturations were monitored continuously. The                            Endoscope was introduced through the  mouth, and                            advanced to the second part of duodenum. The upper                            GI endoscopy was accomplished without difficulty.                            The patient tolerated the procedure well. Scope In: Scope Out: Findings:                 Multiple moderate (circumferential scarring or                            stenosis; an endoscope may pass) benign-appearing,                            intrinsic stenoses were found 33 cm from the                            incisors and extended to the gastroesophageal                            junction at 40 cm. There was acute esophagitis at                            the proximal portion. Improved from last exam, but                            present. The narrowest stenosis measured 1.1 cm                            (inner diameter) and was located at 33 cm. The                            esophagus below this region was narrowed and  ringed. A TTS dilator was passed through the scope.                            Dilation with a 13.5-14.5-15.5 mm balloon dilator                            was performed to 15.5 mm. Each dilation performed                            individually with the balloon being insufflated                            below the gastroesophageal junction and pulled up                            through the esophageal lumen to beyond the                            stenoses. Post dilation evaluation revealed                            disruption of the mucosa at the proximal portion of                            the stenosis.                           The stomach was normal.                           The examined duodenum revealed duodenitis in the                            bulb as manifested by patchy erythema and small                            erosions. Biopsies were taken with a cold forceps                            for Helicobacter pylori testing using  CLOtest.                           The cardia and gastric fundus were normal on                            retroflexion. Complications:            No immediate complications. Estimated Blood Loss:     Estimated blood loss: none. Impression:               - Complex Benign-appearing esophageal stenoses as                            described. Dilated.                           -  Normal stomach.                           - Duodenitis. Biopsied. Recommendation:           - Patient has a contact number available for                            emergencies. The signs and symptoms of potential                            delayed complications were discussed with the                            patient. Return to normal activities tomorrow.                            Written discharge instructions were provided to the                            patient.                           - Post dilation diet.                           - Continue pantoprazole 40 mg TWICE daily                           - Repeat upper endoscopy with esophageal dilation                            with Dr. Henrene Pastor in about 4 weeks. Docia Chuck. Henrene Pastor, MD 07/28/2016 10:48:12 AM This report has been signed electronically.

## 2016-07-29 ENCOUNTER — Telehealth: Payer: Self-pay | Admitting: *Deleted

## 2016-07-29 LAB — HELICOBACTER PYLORI SCREEN-BIOPSY: UREASE: NEGATIVE

## 2016-07-29 NOTE — Telephone Encounter (Signed)
  Follow up Call-  Call back number 07/28/2016 03/30/2016  Post procedure Call Back phone  # [phone]  Permission to leave phone message Yes Yes  Some recent data might be hidden    [facility]

## 2016-07-29 NOTE — Telephone Encounter (Signed)
  Follow up Call-  Call back number 07/28/2016 03/30/2016  Post procedure Call Back phone  # [phone]  Permission to leave phone message Yes Yes  Some recent data might be hidden    [facility]

## 2016-08-09 LAB — HM DIABETES EYE EXAM

## 2016-08-17 ENCOUNTER — Ambulatory Visit (INDEPENDENT_AMBULATORY_CARE_PROVIDER_SITE_OTHER): Payer: Medicare Other | Admitting: Cardiovascular Disease

## 2016-08-17 ENCOUNTER — Encounter: Payer: Self-pay | Admitting: Cardiovascular Disease

## 2016-08-17 VITALS — BP 118/70 | HR 98 | Ht 69.0 in | Wt 190.0 lb

## 2016-08-17 DIAGNOSIS — I519 Heart disease, unspecified: Secondary | ICD-10-CM

## 2016-08-17 DIAGNOSIS — I428 Other cardiomyopathies: Secondary | ICD-10-CM | POA: Diagnosis not present

## 2016-08-17 NOTE — Assessment & Plan Note (Signed)
History of essential hypertension with blood pressure measured A1 18/70. He is on carvedilol. Continue current meds at current dosing

## 2016-08-17 NOTE — Assessment & Plan Note (Signed)
History of hyperlipidemia on statin therapy with recent lipid profile performed 06/27/16 revealing a total cholesterol 105, LDL 56 and HDL of 27.

## 2016-08-17 NOTE — Assessment & Plan Note (Signed)
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 Martinique 04/25/16 revealing noncritical CAD. He denies chest pain or shortness of breath. He is on low-dose carvedilol. We will recheck a 2-D echocardiogram.

## 2016-08-17 NOTE — Patient Instructions (Signed)
Medication Instructions: Your physician recommends that you continue on your current medications as directed. Please refer to the Current Medication list given to you today.  Testing/Procedures: Your physician has requested that you have an echocardiogram. Echocardiography is a painless test that uses sound waves to create images of your heart. It provides your doctor with information about the size and shape of your heart and how well your heart's chambers and valves are working. This procedure takes approximately one hour. There are no restrictions for this procedure.  Follow-Up: We request that you follow-up in: 6 months with Kerin Ransom, PA-C and in 12 months with Dr Andria Rhein will receive a reminder letter in the mail two months in advance. If you don't receive a letter, please call our office to schedule the follow-up appointment.  If you need a refill on your cardiac medications before your next appointment, please call your pharmacy.

## 2016-08-17 NOTE — Progress Notes (Signed)
08/17/2016 James Parsons   02/03/1946  [id number]  Primary Physician Unk Pinto, MD Primary Cardiologist: [name] MD [name]  HPI:  Mr. James Parsons is a 71 year old mildly overweight married Caucasian male father of 2 sons, grandfather and 4 grandchildren who is here in follow-up of nonischemic cardiomyopathy. He is a retired Research officer, trade [employer]. His primary care provider is Dr. [name]. His cardiac risk factor profile is notable for over 100 pack years of tobacco abuse having quit 15 years ago.  He has a history of treated hypertension, hyperlipidemia and diabetes. He never had a heart attack or stroke. He was hospitalized in early January of this year with chest pain and CHF. Cardiac catheterization performed by Dr. Martinique revealed noncritical CAD with an EF of 35%. By echo his EF is 40-45%. He is on appropriate medications. He denies chest pain or shortness of breath.  Current Outpatient Prescriptions  Medication Sig Dispense Refill  . albuterol (VENTOLIN HFA) 108 (90 Base) MCG/ACT inhaler Inhale 2 puffs into the lungs every 4 (four) hours as needed for wheezing or shortness of breath. 1 Inhaler 0  . aspirin EC 81 MG EC tablet Take 1 tablet (81 mg total) by mouth daily. 30 tablet 0  . atorvastatin (LIPITOR) 80 MG tablet Take 1/2 to 1 tablet daily for cholesterol as directed (Patient taking differently: 80 mg. Take 1 tablet daily for cholesterol as directed) 90 tablet 1  . carvedilol (COREG) 6.25 MG tablet Take 1 tablet (6.25 mg total) by mouth 2 (two) times daily with a meal. 30 tablet 3  . Cholecalciferol (VITAMIN D-3) 5000 UNITS TABS Take 5,000 Units by mouth 2 (two) times daily.    . cyclobenzaprine (FLEXERIL) 5 MG tablet Take 1 tablet (5 mg total) by mouth 3 (three) times daily as needed for muscle spasms. Muscle spasm.    . docusate sodium 100 MG CAPS Take 100 mg by mouth 2 (two) times daily. 10 capsule 0  . doxycycline (VIBRAMYCIN) 100 MG capsule Take 1 capsule  twice daily with food 20 capsule 0  . DULoxetine (CYMBALTA) 60 MG capsule Take 60 mg by mouth daily.     . folic acid (FOLVITE) 1 MG tablet Take 1 mg by mouth daily.  1  . furosemide (LASIX) 20 MG tablet Take 1 tablet (20 mg total) by mouth daily. 30 tablet 0  . glimepiride (AMARYL) 4 MG tablet TAKE 1 TABLET TWICE DAILY WITH MEALS. DO NOT TAKE IF YOU DO NOT EAT. 180 tablet 1  . lidocaine (LIDODERM) 5 % Place 1 patch onto the skin daily as needed (pain).    . metFORMIN (GLUCOPHAGE) 500 MG tablet TAKE TWO TABLETS BY MOUTH TWICE DAILY 360 tablet 0  . methotrexate (RHEUMATREX) 2.5 MG tablet Take 15 mg by mouth once a week. On Friday, 6 tablets  1  . metoCLOPramide (REGLAN) 10 MG tablet Take 1 tablet (10 mg total) by mouth 3 (three) times daily before meals. 270 tablet 1  . ondansetron (ZOFRAN ODT) 4 MG disintegrating tablet Take 1 tablet (4 mg total) by mouth every 8 (eight) hours as needed for nausea or vomiting. 20 tablet 0  . pantoprazole (PROTONIX) 40 MG tablet Take 1 tablet (40 mg total) by mouth 2 (two) times daily before a meal. 180 tablet 1  . PRESCRIPTION MEDICATION 0.5 mcg/mL by Pump Prime route continuous. Fentanyl 0.05mg /ml solution 5,000 mcg. Patient will bring dosage instructions with him. Daily dose 0.84mg .    . ranitidine (  ZANTAC) 300 MG tablet TAKE 1 TABLET BY MOUTH AT BEDTIME. 90 tablet 0  . sucralfate (CARAFATE) 1 g tablet Take 1 tablet (1 g total) by mouth 4 (four) times daily. 360 tablet 3  . tamsulosin (FLOMAX) 0.4 MG CAPS capsule Take 1 capsule (0.4 mg total) by mouth daily after supper. 90 capsule 3  . Tapentadol HCl 100 MG TABS Take 1 tablet by mouth 2 (two) times daily.    . pregabalin (LYRICA) 150 MG capsule Take 150 mg by mouth 2 (two) times daily.     Current Facility-Administered Medications  Medication Dose Route Frequency Provider Last Rate Last Dose  . 0.9 %  sodium chloride infusion  500 mL Intravenous Continuous Irene Shipper, MD        Allergies  Allergen  Reactions  . Codeine Itching  . Morphine And Related Other (See Comments)    hallucinations    Social History   Social History  . Marital status: Married    Spouse name: karen  . Number of children: 2  . Years of education: N/A   Occupational History  . retired Management consultant    Social History Main Topics  . Smoking status: Former Smoker    Packs/day: 3.00    Years: 40.00    Types: Cigarettes    Quit date: 05/25/2002  . Smokeless tobacco: Never Used  . Alcohol use No  . Drug use: No  . Sexual activity: No   Other Topics Concern  . Not on file   Social History Narrative  . No narrative on file     Review of Systems: General: negative for chills, fever, night sweats or weight changes.  Cardiovascular: negative for chest pain, dyspnea on exertion, edema, orthopnea, palpitations, paroxysmal nocturnal dyspnea or shortness of breath Dermatological: negative for rash Respiratory: negative for cough or wheezing Urologic: negative for hematuria Abdominal: negative for nausea, vomiting, diarrhea, bright red blood per rectum, melena, or hematemesis Neurologic: negative for visual changes, syncope, or dizziness All other systems reviewed and are otherwise negative except as noted above.    Blood pressure 118/70, pulse 98, height 5\' 9"  (1.753 m), weight 190 lb (86.2 kg), SpO2 100 %.  General appearance: alert and no distress Neck: no adenopathy, no carotid bruit, no JVD, supple, symmetrical, trachea midline and thyroid not enlarged, symmetric, no tenderness/mass/nodules Lungs: clear to auscultation bilaterally Heart: regular rate and rhythm, S1, S2 normal, no murmur, click, rub or gallop Extremities: extremities normal, atraumatic, no cyanosis or edema  EKG not performed today  ASSESSMENT AND PLAN:   Mixed hyperlipidemia History of hyperlipidemia on statin therapy with recent lipid profile performed 06/27/16 revealing a total cholesterol 105, LDL 56 and HDL of  27.  Essential hypertension History of essential hypertension with blood pressure measured A1 18/70. He is on carvedilol. 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 Martinique 04/25/16 revealing noncritical CAD. He denies chest pain or shortness of breath. He is on low-dose carvedilol. We will recheck a 2-D echocardiogram.      [name] MD [facility] 08/17/2016 12:22 PM

## 2016-08-23 ENCOUNTER — Other Ambulatory Visit: Payer: Self-pay

## 2016-08-23 MED ORDER — TAMSULOSIN HCL 0.4 MG PO CAPS: 0.4000 mg | ORAL_CAPSULE | Freq: Every day | ORAL | 0 refills | Status: DC

## 2016-08-24 ENCOUNTER — Other Ambulatory Visit: Payer: Self-pay | Admitting: Internal Medicine

## 2016-08-26 ENCOUNTER — Other Ambulatory Visit: Payer: Self-pay | Admitting: Internal Medicine

## 2016-08-26 ENCOUNTER — Ambulatory Visit (HOSPITAL_BASED_OUTPATIENT_CLINIC_OR_DEPARTMENT_OTHER): Payer: Medicare Other

## 2016-08-26 ENCOUNTER — Other Ambulatory Visit: Payer: Self-pay | Admitting: Cardiology

## 2016-08-26 ENCOUNTER — Other Ambulatory Visit: Payer: Self-pay

## 2016-08-26 DIAGNOSIS — I519 Heart disease, unspecified: Secondary | ICD-10-CM | POA: Diagnosis not present

## 2016-08-26 DIAGNOSIS — R0602 Shortness of breath: Secondary | ICD-10-CM | POA: Diagnosis not present

## 2016-08-26 DIAGNOSIS — I110 Hypertensive heart disease with heart failure: Secondary | ICD-10-CM | POA: Diagnosis not present

## 2016-08-26 MED ORDER — METFORMIN HCL ER 500 MG PO TB24: ORAL_TABLET | ORAL | 1 refills | Status: DC

## 2016-08-29 ENCOUNTER — Other Ambulatory Visit: Payer: Self-pay

## 2016-08-29 ENCOUNTER — Emergency Department (HOSPITAL_COMMUNITY): Payer: Medicare Other

## 2016-08-29 ENCOUNTER — Encounter (HOSPITAL_COMMUNITY): Payer: Self-pay | Admitting: Emergency Medicine

## 2016-08-29 ENCOUNTER — Inpatient Hospital Stay (HOSPITAL_COMMUNITY)
Admission: EM | Admit: 2016-08-29 | Discharge: 2016-09-01 | DRG: 291 | Disposition: A | Discharge status: Home / Self Care | Payer: Medicare Other | Attending: Internal Medicine | Admitting: Internal Medicine

## 2016-08-29 DIAGNOSIS — M1990 Unspecified osteoarthritis, unspecified site: Secondary | ICD-10-CM | POA: Diagnosis present

## 2016-08-29 DIAGNOSIS — E119 Type 2 diabetes mellitus without complications: Secondary | ICD-10-CM

## 2016-08-29 DIAGNOSIS — N400 Enlarged prostate without lower urinary tract symptoms: Secondary | ICD-10-CM | POA: Diagnosis present

## 2016-08-29 DIAGNOSIS — G9050 Complex regional pain syndrome I, unspecified: Secondary | ICD-10-CM | POA: Diagnosis present

## 2016-08-29 DIAGNOSIS — I5043 Acute on chronic combined systolic (congestive) and diastolic (congestive) heart failure: Secondary | ICD-10-CM | POA: Diagnosis present

## 2016-08-29 DIAGNOSIS — D649 Anemia, unspecified: Secondary | ICD-10-CM | POA: Diagnosis present

## 2016-08-29 DIAGNOSIS — J9621 Acute and chronic respiratory failure with hypoxia: Secondary | ICD-10-CM | POA: Diagnosis present

## 2016-08-29 DIAGNOSIS — Z87891 Personal history of nicotine dependence: Secondary | ICD-10-CM

## 2016-08-29 DIAGNOSIS — J810 Acute pulmonary edema: Secondary | ICD-10-CM | POA: Diagnosis not present

## 2016-08-29 DIAGNOSIS — Z885 Allergy status to narcotic agent status: Secondary | ICD-10-CM

## 2016-08-29 DIAGNOSIS — R0902 Hypoxemia: Secondary | ICD-10-CM | POA: Diagnosis not present

## 2016-08-29 DIAGNOSIS — Z7982 Long term (current) use of aspirin: Secondary | ICD-10-CM

## 2016-08-29 DIAGNOSIS — R748 Abnormal levels of other serum enzymes: Secondary | ICD-10-CM

## 2016-08-29 DIAGNOSIS — I2510 Atherosclerotic heart disease of native coronary artery without angina pectoris: Secondary | ICD-10-CM

## 2016-08-29 DIAGNOSIS — Z7984 Long term (current) use of oral hypoglycemic drugs: Secondary | ICD-10-CM | POA: Diagnosis not present

## 2016-08-29 DIAGNOSIS — E1165 Type 2 diabetes mellitus with hyperglycemia: Secondary | ICD-10-CM

## 2016-08-29 DIAGNOSIS — M545 Low back pain: Secondary | ICD-10-CM | POA: Diagnosis present

## 2016-08-29 DIAGNOSIS — J449 Chronic obstructive pulmonary disease, unspecified: Secondary | ICD-10-CM | POA: Diagnosis present

## 2016-08-29 DIAGNOSIS — E872 Acidosis, unspecified: Secondary | ICD-10-CM | POA: Diagnosis present

## 2016-08-29 DIAGNOSIS — R079 Chest pain, unspecified: Secondary | ICD-10-CM | POA: Diagnosis not present

## 2016-08-29 DIAGNOSIS — I872 Venous insufficiency (chronic) (peripheral): Secondary | ICD-10-CM | POA: Diagnosis present

## 2016-08-29 DIAGNOSIS — R0602 Shortness of breath: Secondary | ICD-10-CM | POA: Diagnosis present

## 2016-08-29 DIAGNOSIS — I110 Hypertensive heart disease with heart failure: Secondary | ICD-10-CM | POA: Diagnosis present

## 2016-08-29 DIAGNOSIS — E785 Hyperlipidemia, unspecified: Secondary | ICD-10-CM | POA: Diagnosis present

## 2016-08-29 DIAGNOSIS — R778 Other specified abnormalities of plasma proteins: Secondary | ICD-10-CM

## 2016-08-29 DIAGNOSIS — I5020 Unspecified systolic (congestive) heart failure: Secondary | ICD-10-CM | POA: Insufficient documentation

## 2016-08-29 DIAGNOSIS — D179 Benign lipomatous neoplasm, unspecified: Secondary | ICD-10-CM | POA: Diagnosis present

## 2016-08-29 DIAGNOSIS — I509 Heart failure, unspecified: Secondary | ICD-10-CM | POA: Diagnosis not present

## 2016-08-29 DIAGNOSIS — I10 Essential (primary) hypertension: Secondary | ICD-10-CM | POA: Diagnosis present

## 2016-08-29 DIAGNOSIS — R7989 Other specified abnormal findings of blood chemistry: Secondary | ICD-10-CM | POA: Diagnosis present

## 2016-08-29 DIAGNOSIS — K219 Gastro-esophageal reflux disease without esophagitis: Secondary | ICD-10-CM | POA: Diagnosis present

## 2016-08-29 DIAGNOSIS — Z79899 Other long term (current) drug therapy: Secondary | ICD-10-CM

## 2016-08-29 DIAGNOSIS — Z86010 Personal history of colonic polyps: Secondary | ICD-10-CM | POA: Diagnosis not present

## 2016-08-29 DIAGNOSIS — E0865 Diabetes mellitus due to underlying condition with hyperglycemia: Secondary | ICD-10-CM | POA: Diagnosis not present

## 2016-08-29 DIAGNOSIS — E1121 Type 2 diabetes mellitus with diabetic nephropathy: Secondary | ICD-10-CM | POA: Diagnosis not present

## 2016-08-29 LAB — CBC WITH DIFFERENTIAL/PLATELET
BASOS ABS: 0 10*3/uL (ref 0.0–0.1)
Basophils Relative: 0 %
Eosinophils Absolute: 0.2 10*3/uL (ref 0.0–0.7)
Eosinophils Relative: 1 %
HEMATOCRIT: 34.1 % — AB (ref 39.0–52.0)
Hemoglobin: 10.9 g/dL — ABNORMAL LOW (ref 13.0–17.0)
LYMPHS ABS: 1.8 10*3/uL (ref 0.7–4.0)
LYMPHS PCT: 15 %
MCH: 27.5 pg (ref 26.0–34.0)
MCHC: 32 g/dL (ref 30.0–36.0)
MCV: 86.1 fL (ref 78.0–100.0)
MONO ABS: 0.2 10*3/uL (ref 0.1–1.0)
MONOS PCT: 2 %
NEUTROS ABS: 9.8 10*3/uL — AB (ref 1.7–7.7)
NEUTROS PCT: 82 %
Platelets: 276 10*3/uL (ref 150–400)
RBC: 3.96 MIL/uL — ABNORMAL LOW (ref 4.22–5.81)
RDW: 17.2 % — AB (ref 11.5–15.5)
WBC: 12 10*3/uL — ABNORMAL HIGH (ref 4.0–10.5)

## 2016-08-29 LAB — COMPREHENSIVE METABOLIC PANEL
ALT: 18 U/L (ref 17–63)
AST: 25 U/L (ref 15–41)
Albumin: 3.8 g/dL (ref 3.5–5.0)
Alkaline Phosphatase: 98 U/L (ref 38–126)
Anion gap: 11 (ref 5–15)
BILIRUBIN TOTAL: 0.9 mg/dL (ref 0.3–1.2)
BUN: 14 mg/dL (ref 6–20)
CO2: 24 mmol/L (ref 22–32)
Calcium: 8.9 mg/dL (ref 8.9–10.3)
Chloride: 101 mmol/L (ref 101–111)
Creatinine, Ser: 0.66 mg/dL (ref 0.61–1.24)
GFR calc Af Amer: >60 mL/min (ref >60)
Glucose, Bld: 244 mg/dL — ABNORMAL HIGH (ref 65–99)
POTASSIUM: 3.5 mmol/L (ref 3.5–5.1)
Sodium: 136 mmol/L (ref 135–145)
TOTAL PROTEIN: 6.8 g/dL (ref 6.5–8.1)

## 2016-08-29 LAB — PROTIME-INR
INR: 1.04
Prothrombin Time: 13.6 seconds (ref 11.4–15.2)

## 2016-08-29 LAB — I-STAT CG4 LACTIC ACID, ED
LACTIC ACID, VENOUS: 2.98 mmol/L — AB (ref 0.5–1.9)
LACTIC ACID, VENOUS: 2.09 mmol/L — AB (ref 0.5–1.9)

## 2016-08-29 LAB — I-STAT TROPONIN, ED
Troponin i, poc: 0 ng/mL (ref 0.00–0.08)
Troponin i, poc: 0.03 ng/mL (ref 0.00–0.08)

## 2016-08-29 LAB — BRAIN NATRIURETIC PEPTIDE: B Natriuretic Peptide: 209.7 pg/mL — ABNORMAL HIGH (ref 0.0–100.0)

## 2016-08-29 MED ORDER — ONDANSETRON HCL 4 MG/2ML IJ SOLN: 4.0000 mg | Freq: Four times a day (QID) | INTRAMUSCULAR | Status: DC | PRN

## 2016-08-29 MED ORDER — CARVEDILOL 6.25 MG PO TABS
6.2500 mg | ORAL_TABLET | Freq: Two times a day (BID) | ORAL | Status: DC
  Administered 2016-08-30 – 2016-09-01 (×5): 6.25 mg via ORAL
  Filled 2016-08-29 (×5): qty 1

## 2016-08-29 MED ORDER — IOPAMIDOL (ISOVUE-370) INJECTION 76%
INTRAVENOUS | Status: AC
  Administered 2016-08-29: 100 mL
  Filled 2016-08-29: qty 100

## 2016-08-29 MED ORDER — ENOXAPARIN SODIUM 30 MG/0.3ML ~~LOC~~ SOLN
30.0000 mg | SUBCUTANEOUS | Status: DC
  Administered 2016-08-30: 30 mg via SUBCUTANEOUS
  Filled 2016-08-29: qty 0.3

## 2016-08-29 MED ORDER — TAMSULOSIN HCL 0.4 MG PO CAPS
0.4000 mg | ORAL_CAPSULE | Freq: Every day | ORAL | Status: DC
  Administered 2016-08-30 – 2016-08-31 (×2): 0.4 mg via ORAL
  Filled 2016-08-29 (×2): qty 1

## 2016-08-29 MED ORDER — FUROSEMIDE 10 MG/ML IJ SOLN
40.0000 mg | Freq: Two times a day (BID) | INTRAMUSCULAR | Status: DC
  Administered 2016-08-30 – 2016-09-01 (×5): 40 mg via INTRAVENOUS
  Filled 2016-08-29 (×5): qty 4

## 2016-08-29 MED ORDER — ASPIRIN EC 81 MG PO TBEC
81.0000 mg | DELAYED_RELEASE_TABLET | Freq: Every day | ORAL | Status: DC
  Administered 2016-08-30 – 2016-08-31 (×3): 81 mg via ORAL
  Filled 2016-08-29 (×3): qty 1

## 2016-08-29 MED ORDER — FUROSEMIDE 10 MG/ML IJ SOLN
40.0000 mg | Freq: Once | INTRAMUSCULAR | Status: AC
  Administered 2016-08-29: 40 mg via INTRAVENOUS
  Filled 2016-08-29: qty 4

## 2016-08-29 MED ORDER — INSULIN ASPART 100 UNIT/ML ~~LOC~~ SOLN
0.0000 [IU] | Freq: Three times a day (TID) | SUBCUTANEOUS | Status: DC
  Administered 2016-08-30: 5 [IU] via SUBCUTANEOUS
  Administered 2016-08-30: 3 [IU] via SUBCUTANEOUS
  Administered 2016-08-30: 2 [IU] via SUBCUTANEOUS
  Administered 2016-08-31: 1 [IU] via SUBCUTANEOUS
  Administered 2016-08-31: 2 [IU] via SUBCUTANEOUS
  Administered 2016-08-31 – 2016-09-01 (×2): 1 [IU] via SUBCUTANEOUS

## 2016-08-29 MED ORDER — ACETAMINOPHEN 325 MG PO TABS: 650.0000 mg | ORAL_TABLET | ORAL | Status: DC | PRN

## 2016-08-29 MED ORDER — INSULIN ASPART 100 UNIT/ML ~~LOC~~ SOLN
0.0000 [IU] | Freq: Every day | SUBCUTANEOUS | Status: DC
  Administered 2016-08-30 – 2016-08-31 (×2): 2 [IU] via SUBCUTANEOUS

## 2016-08-29 MED ORDER — ATORVASTATIN CALCIUM 80 MG PO TABS
80.0000 mg | ORAL_TABLET | Freq: Every day | ORAL | Status: DC
  Administered 2016-08-30 – 2016-08-31 (×3): 80 mg via ORAL
  Filled 2016-08-29 (×3): qty 1

## 2016-08-29 MED ORDER — SODIUM CHLORIDE 0.9% FLUSH: 3.0000 mL | INTRAVENOUS | Status: DC | PRN

## 2016-08-29 MED ORDER — PANTOPRAZOLE SODIUM 40 MG PO TBEC
40.0000 mg | DELAYED_RELEASE_TABLET | Freq: Two times a day (BID) | ORAL | Status: DC
  Administered 2016-08-30 – 2016-09-01 (×5): 40 mg via ORAL
  Filled 2016-08-29 (×5): qty 1

## 2016-08-29 MED ORDER — SODIUM CHLORIDE 0.9 % IV SOLN: 250.0000 mL | INTRAVENOUS | Status: DC | PRN

## 2016-08-29 MED ORDER — SODIUM CHLORIDE 0.9% FLUSH
3.0000 mL | Freq: Two times a day (BID) | INTRAVENOUS | Status: DC
  Administered 2016-08-30 – 2016-09-01 (×6): 3 mL via INTRAVENOUS

## 2016-08-29 NOTE — ED Notes (Signed)
RT removed bipap, pt reports breathing better, no distress noted at this time. Urinal at bedside

## 2016-08-29 NOTE — ED Triage Notes (Signed)
PT to ED via [facility] with c/o resp distress and chest pain.  EMS reports pt started having shortness of breath at home and wife was driving him to the ED when he started to have central chest pain and increased shortness of breath  Wife pulled over and called 911. On arrival to ED pt was on Bi-Pap and tolerating it well.  Pt was given Mag. 2 g.  Solu Medrol 125mg  and Albuterol and Atrovent.

## 2016-08-29 NOTE — Telephone Encounter (Signed)
Rx(s) sent to pharmacy electronically.  

## 2016-08-29 NOTE — ED Notes (Signed)
Pt c/o increased SOB and worsening back pain. RT at bedside readjusting mask, resp rate was at 40. Pillow placed behind back for comfort. Dr.Tegeler at bedside, updating family

## 2016-08-29 NOTE — Progress Notes (Signed)
RT NOTE:  BIPAP removed. Pt tolerating 4L [place] @ this time. RT will monitor.

## 2016-08-29 NOTE — ED Notes (Signed)
Dr. Albea at bedside.

## 2016-08-29 NOTE — ED Provider Notes (Signed)
[facility] DEPT Provider Note   CSN: [id number] Arrival date & time: 08/29/16  2035     History   Chief Complaint Chief Complaint  Patient presents with  . Respiratory Distress  . Chest Pain    HPI James Parsons is a 71 y.o. male.  Patient with SOB that started several hours ago and rapidly worsened associated with chest pain. Patient was cutting his lawn today and states symptoms came on quickly. EMS gave patient solumedrol, duonebs, mag for possible COPD. Patient placed on BiPaP after having hypoxia with EMS.    The history is provided by the patient and the EMS personnel.  Shortness of Breath  This is a chronic problem. The current episode started 3 to 5 hours ago. The problem has been rapidly worsening. Associated symptoms include chest pain. Pertinent negatives include no fever, no headaches, no rhinorrhea, no sore throat, no ear pain, no neck pain, no cough, no sputum production, no hemoptysis, no wheezing, no vomiting, no abdominal pain, no rash, no leg pain and no leg swelling. It is unknown what precipitated the problem. He has tried ipratropium inhalers and beta-agonist inhalers for the symptoms. The treatment provided no relief. Associated medical issues include COPD, CAD and heart failure. Associated medical issues do not include PE.    Past Medical History:  Diagnosis Date  . Allergy   . Anemia   . Arthritis   . Borderline hypertension   . CHF (congestive heart failure) ([place])   . Clotting disorder (HCC)    bleeds easily-no dx  . COPD (chronic obstructive pulmonary disease) ([place])    denies  . Depression    hx of   . Diabetes mellitus   . Diverticulosis of colon   . Full dentures   . GERD (gastroesophageal reflux disease)   . Hx of colonic polyps   . Hyperlipidemia   . Hypertension   . Lipoma   . Low back pain   . Mental disorder   . Obesity   . Pneumonia    hx  . RSD (reflex sympathetic dystrophy)   . Venous insufficiency     Patient Active  Problem List   Diagnosis Date Noted  . Systolic CHF ([place]) 35/00/9381  . Atherosclerosis of aorta ([place]) 06/28/2016  . CAD in native artery 05/17/2016  . LBBB (left bundle branch block) 05/17/2016  . Non-ischemic cardiomyopathy ([place]) 04/29/2016  . Chronic combined systolic and diastolic HF (heart failure) ([place]) 04/29/2016  . Non-insulin treated type 2 diabetes mellitus ([place])   . Hypokalemia   . Medicare annual wellness visit, subsequent 03/10/2015  . RSD (reflex sympathetic dystrophy) 04/07/2014  . Obesity 04/07/2014  . Spinal cord stimulator status 03/31/2014  . Chronic pain associated with significant psychosocial dysfunction 10/24/2013  . Vitamin D deficiency 09/18/2013  . Medication management 09/18/2013  . T2_NIDDM 11/02/2012  . Anemia 11/02/2012  . 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 Spinal stenosis, multilevel 05/17/2012  . Cervical arthritis ([place]) 05/17/2012  . DJD (degenerative joint disease) 04/24/2012  . Essential hypertension 03/20/2010  . COPD (chronic obstructive pulmonary disease) ([place]) 03/09/2009  . Venous (peripheral) insufficiency 09/12/2008  . Diverticulosis of large intestine 09/12/2008  . COLONIC POLYPS 02/22/2007  . Mixed hyperlipidemia 02/22/2007  . LOW BACK PAIN SYNDROME 02/22/2007    Past Surgical History:  Procedure Laterality Date  . BACK SURGERY  2005,2007  . C3-4 anterior cervical discectomy and fusion with plating at c3-4  05/2006   Dr. Arnoldo Morale  . CARDIAC CATHETERIZATION  N/A 04/25/2016   Procedure: Right/Left Heart Cath and Coronary Angiography;  Surgeon: Peter M Martinique, MD;  Location: [facility];  Service: Cardiovascular;  Laterality: N/A;  . COLONOSCOPY    . COLONOSCOPY W/ POLYPECTOMY    . excision of lipoma from right olecranon area  11/2000   Dr. Rise [name]  . KNEE ARTHROSCOPY     right knee  . KNEE ARTHROSCOPY Right 06/06/2014   Procedure: ARTHROSCOPY RIGHT KNEE WITH REMOVAL OF FIBROUS BANDS;  Surgeon: Kerin Salen, MD;   Location: [facility];  Service: Orthopedics;  Laterality: Right;  . LUMBAR LAMINECTOMY/DECOMPRESSION MICRODISCECTOMY N/A 01/08/2014   Procedure: L4-S1 Decompression with removal and reimplantation of spinal cord stimulator battery ;  Surgeon: Melina Schools, MD;  Location: [facility];  Service: Orthopedics;  Laterality: N/A;  . microdiscectomy and decompression  05/2002   Dr. Tonita Cong  . morphine pump  2009   due to reaction to morphine/changed to fentanyl  . PAIN PUMP IMPLANTATION     with fentanyl  . spinal cord stimulator implanted     for pain per Dr. Maryruth Eve  . subcut pain pump implanted    . TONSILLECTOMY    . TOTAL KNEE ARTHROPLASTY  02/29/2012   Procedure: TOTAL KNEE ARTHROPLASTY;  Surgeon: Kerin Salen, MD;  Location: [facility];  Service: Orthopedics;  Laterality: Right;       Home Medications    Prior to Admission medications   Medication Sig Start Date End Date Taking? Authorizing Provider  albuterol (VENTOLIN HFA) 108 (90 Base) MCG/ACT inhaler Inhale 2 puffs into the lungs every 4 (four) hours as needed for wheezing or shortness of breath. 06/27/16  Yes Vicie Mutters, PA-C  aspirin EC 81 MG EC tablet Take 1 tablet (81 mg total) by mouth daily. Patient taking differently: Take 81 mg by mouth at bedtime.  04/27/16  Yes Hongalgi, Lenis Dickinson, MD  atorvastatin (LIPITOR) 80 MG tablet Take 1/2 to 1 tablet daily for cholesterol as directed Patient taking differently: Take 80 mg by mouth at bedtime. for cholesterol as directed 03/29/16 09/27/16 Yes Unk Pinto, MD  carvedilol (COREG) 6.25 MG tablet TAKE 1 TABLET BY MOUTH TWICE A DAY WITH A MEAL 08/29/16  Yes [name], MD  Cholecalciferol (VITAMIN D-3) 5000 UNITS TABS Take 5,000 Units by mouth 2 (two) times daily.   Yes [provider]  cyclobenzaprine (FLEXERIL) 5 MG tablet Take 1 tablet (5 mg total) by mouth 3 (three) times daily as needed for muscle spasms. Muscle spasm. 05/01/16  Yes Thurnell Lose, MD    DULoxetine (CYMBALTA) 60 MG capsule Take 60 mg by mouth daily.  10/09/12  Yes [provider]  folic acid (FOLVITE) 1 MG tablet Take 1 mg by mouth daily. 08/13/14  Yes [provider]  furosemide (LASIX) 20 MG tablet Take 1 tablet (20 mg total) by mouth daily. 05/01/16  Yes Thurnell Lose, MD  glimepiride (AMARYL) 4 MG tablet TAKE 1 TABLET TWICE DAILY WITH MEALS. DO NOT TAKE IF YOU DO NOT EAT. 06/26/16  Yes Unk Pinto, MD  lidocaine (LIDODERM) 5 % Place 1 patch onto the skin daily as needed (pain). 04/26/16  Yes Hongalgi, Lenis Dickinson, MD  metFORMIN (GLUCOPHAGE) 500 MG tablet Take 1,000 mg by mouth 2 (two) times daily. 08/24/16  Yes [provider]  methotrexate (RHEUMATREX) 2.5 MG tablet Take 15 mg by mouth every Friday. 6 tablets 08/13/14  Yes [provider]  metoCLOPramide (REGLAN) 10 MG tablet Take  1 tablet (10 mg total) by mouth 3 (three) times daily before meals. Patient taking differently: Take 10 mg by mouth 2 (two) times daily before a meal.  05/04/16  Yes Vicie Mutters, PA-C  pantoprazole (PROTONIX) 40 MG tablet Take 1 tablet (40 mg total) by mouth 2 (two) times daily before a meal. 05/05/16 05/05/17 Yes Irene Shipper, MD  pregabalin (LYRICA) 150 MG capsule Take 150 mg by mouth 2 (two) times daily.   Yes [provider]  PRESCRIPTION MEDICATION by Intrathecal route continuous. Fentanyl 0.05mg /ml solution . Daily dose 0.84 ml Compounded at [facility] [phone] Pump is refilled monthly   Yes [provider]  ranitidine (ZANTAC) 300 MG tablet TAKE 1 TABLET BY MOUTH AT BEDTIME. 08/24/16  Yes Vicie Mutters, PA-C  tamsulosin (FLOMAX) 0.4 MG CAPS capsule Take 1 capsule (0.4 mg total) by mouth daily after supper. 08/23/16  Yes Vicie Mutters, PA-C  Tapentadol HCl (NUCYNTA) 100 MG TABS Take 100 mg by mouth See admin instructions. Take 1 tablet (100 mg)n by mouth twice daily, may also take 1 tablet during the day as needed  for pain   Yes [provider]  docusate sodium 100 MG CAPS Take 100 mg by mouth 2 (two) times daily. Patient not taking: Reported on 08/29/2016 04/09/14   Jonetta Osgood, MD  metFORMIN (GLUCOPHAGE XR) 500 MG 24 hr tablet Take 2 tablets 2 x / day for Diabetes Patient not taking: Reported on 08/29/2016 08/26/16 02/26/17  Unk Pinto, MD  ondansetron (ZOFRAN ODT) 4 MG disintegrating tablet Take 1 tablet (4 mg total) by mouth every 8 (eight) hours as needed for nausea or vomiting. Patient not taking: Reported on 08/29/2016 07/26/16   Esaw Grandchild, MD  sucralfate (CARAFATE) 1 g tablet Take 1 tablet (1 g total) by mouth 4 (four) times daily. Patient not taking: Reported on 08/29/2016 05/04/16 05/04/17  Vicie Mutters, PA-C    Family History Family History  Problem Relation Age of Onset  . Cancer Father   . Colon cancer Neg Hx   . Esophageal cancer Neg Hx   . Stomach cancer Neg Hx   . Rectal cancer Neg Hx     Social History Social History  Substance Use Topics  . Smoking status: Former Smoker    Packs/day: 3.00    Years: 40.00    Types: Cigarettes    Quit date: 05/25/2002  . Smokeless tobacco: Never Used  . Alcohol use No     Allergies   Codeine and Morphine and related   Review of Systems Review of Systems  Constitutional: Negative for chills and fever.  HENT: Negative for ear pain, rhinorrhea and sore throat.   Eyes: Negative for pain and visual disturbance.  Respiratory: Positive for shortness of breath. Negative for cough, hemoptysis, sputum production, wheezing and stridor.   Cardiovascular: Positive for chest pain. Negative for palpitations and leg swelling.  Gastrointestinal: Negative for abdominal pain and vomiting.  Genitourinary: Negative for dysuria and hematuria.  Musculoskeletal: Negative for arthralgias, back pain and neck pain.  Skin: Negative for color change and rash.  Neurological: Negative for seizures, syncope and headaches.  All other  systems reviewed and are negative.    Physical Exam Updated Vital Signs  ED Triage Vitals  Enc Vitals Group     BP 08/29/16 2053 131/75     Pulse Rate 08/29/16 2053 (!) 113     Resp 08/29/16 2100 20     Temp --  Temp src --      SpO2 08/29/16 2035 100 %     Weight 08/29/16 2059 102 lb 7 oz (46.5 kg)     Height 08/29/16 2059 5\' 9"  (1.753 m)     Head Circumference --      Peak Flow --      Pain Score --      Pain Loc --      Pain Edu? --      Excl. in [place]? --     Physical Exam  Constitutional: He is oriented to person, place, and time. He appears distressed.  HENT:  Head: Normocephalic and atraumatic.  Eyes: Conjunctivae and EOM are normal. Pupils are equal, round, and reactive to light.  Neck: Normal range of motion. Neck supple. No tracheal deviation present.  Cardiovascular: Normal rate, regular rhythm, normal heart sounds and intact distal pulses.   No murmur heard. Pulmonary/Chest: He is in respiratory distress. He has rales.  Poor effort, diminished throughout, on BiPaP  Abdominal: Soft. There is no tenderness.  Musculoskeletal: Normal range of motion. He exhibits no edema.  Neurological: He is alert and oriented to person, place, and time.  Skin: Skin is warm and dry.  Psychiatric: He has a normal mood and affect.  Nursing note and vitals reviewed.    ED Treatments / Results  Labs (all labs ordered are listed, but only abnormal results are displayed) Labs Reviewed  CBC WITH DIFFERENTIAL/PLATELET - Abnormal; Notable for the following:       Result Value   WBC 12.0 (*)    RBC 3.96 (*)    Hemoglobin 10.9 (*)    HCT 34.1 (*)    RDW 17.2 (*)    Neutro Abs 9.8 (*)    All other components within normal limits  COMPREHENSIVE METABOLIC PANEL - Abnormal; Notable for the following:    Glucose, Bld 244 (*)    All other components within normal limits  BRAIN NATRIURETIC PEPTIDE - Abnormal; Notable for the following:    B Natriuretic Peptide 209.7 (*)    All  other components within normal limits  TROPONIN I - Abnormal; Notable for the following:    Troponin I 0.05 (*)    All other components within normal limits  I-STAT CG4 LACTIC ACID, ED - Abnormal; Notable for the following:    Lactic Acid, Venous 2.98 (*)    All other components within normal limits  I-STAT CG4 LACTIC ACID, ED - Abnormal; Notable for the following:    Lactic Acid, Venous 2.09 (*)    All other components within normal limits  PROTIME-INR  BASIC METABOLIC PANEL  TROPONIN I  TROPONIN I  CBC WITH DIFFERENTIAL/PLATELET  LACTIC ACID, PLASMA  I-STAT TROPOININ, ED  I-STAT TROPOININ, ED    EKG  EKG Interpretation  Date/Time:  Monday Aug 29 2016 20:45:06 EDT Ventricular Rate:  111 PR Interval:    QRS Duration: 162 QT Interval:  449 QTC Calculation: 611 R Axis:   25 Text Interpretation:  Junctional tachycardia Left bundle branch block When compared to prior, T wave inversions in lead V5-V6 No STEMI Confirmed by Antony Blackbird [phone]) on 08/29/2016 8:52:51 PM       Radiology Ct Angio Chest Pe W Or Wo Contrast  Result Date: 08/29/2016 CLINICAL DATA:  Midsternal chest pain, shortness of breath this evening. Hypoxic. History of diabetes, COPD, CHF. EXAM: CT ANGIOGRAPHY CHEST WITH CONTRAST TECHNIQUE: Multidetector CT imaging of the chest was performed using the standard protocol during bolus  administration of intravenous contrast. Multiplanar CT image reconstructions and MIPs were obtained to evaluate the vascular anatomy. CONTRAST:  100 cc Isovue 370 COMPARISON:  Chest radiograph Aug 29, 2016 at 2102 hours and CT chest April 22, 2016 FINDINGS: CARDIOVASCULAR: Adequate contrast opacification of the pulmonary artery's. Main pulmonary artery is not enlarged. No pulmonary arterial filling defects to the level of the subsegmental branches. The heart is mildly enlarged. Mild coronary artery calcifications. No pericardial effusion. No pericardial effusion. Thoracic aorta is normal  course and caliber, mild calcific atherosclerosis. MEDIASTINUM/NODES: Greater than expected number of non pathologically enlarged mediastinal lymph nodes are likely reactive and, unchanged. LUNGS/PLEURA: Tracheobronchial tree is patent, no pneumothorax. Bronchial wall thickening associated with edema. Increased small bilateral pleural effusions. Interlobular septal thickening and heterogeneous lung attenuation. Patchy bibasilar airspace opacities have increased. 4 mm RIGHT upper lobe sub solid pulmonary nodule, 3 mm subpleural RIGHT upper lobe pulmonary nodule. Previous LEFT upper lobe pulmonary nodule has resolved. UPPER ABDOMEN: Included view of the abdomen is unremarkable. MUSCULOSKELETAL: Visualized soft tissues and included osseous structures are nonacute. S-type scoliosis. Thoracic spinal cord stimulator lead within mid dorsal canal. Subacute to old RIGHT eighth through tenth rib fractures. Review of the MIP images confirms the above findings. IMPRESSION: No acute pulmonary embolism. Stable mild cardiomegaly. Worsening pulmonary edema. Increased small pleural effusions and consolidation in the lung bases favoring confluent edema. Resolution of LEFT upper lobe pulmonary nodule, stable sub solid RIGHT pulmonary nodules measuring to 4 mm. No follow-up needed if patient is low-risk (and has no known or suspected primary neoplasm). Non-contrast chest CT can be considered in 12 months if patient is high-risk. This recommendation follows the consensus statement: Guidelines for Management of Incidental Pulmonary Nodules Detected on CT Images: From the Fleischner Society 2017; Radiology 2017; 284:228-243. Electronically Signed   By: Elon Alas M.D.   On: 08/29/2016 22:52   Dg Chest Portable 1 View  Result Date: 08/29/2016 CLINICAL DATA:  Shortness of Breath EXAM: PORTABLE CHEST 1 VIEW COMPARISON:  04/26/2016 FINDINGS: Cardiac shadow is mildly enlarged. Vascular congestion is noted with small pleural  effusions. No focal confluent infiltrate is seen. Mild interstitial edema is noted. Spinal stimulators are again noted. Previously seen vague nodular density over the right lung base is obscured by the parenchymal changes. IMPRESSION: Mild changes of CHF. Electronically Signed   By: Inez Catalina M.D.   On: 08/29/2016 21:17    Procedures Procedures (including critical care time)  EMERGENCY DEPARTMENT [place] LUNG EXAM "Study: Limited Ultrasound of the Lung and Thorax"  INDICATIONS: Dyspnea Multiple views of both lungs using sagittal orientation were obtained.  PERFORMED BY: Myself IMAGES ARCHIVED?: Yes LIMITATIONS: Respiratory distress VIEWS USED: Anterior lung fields INTERPRETATION: No pneumothorax, Pulmonary edema, B Lines    EMERGENCY DEPARTMENT [place] CARDIAC EXAM "Study: Limited Ultrasound of the Heart and Pericardium"  INDICATIONS:Dyspnea Multiple views of the heart and pericardium were obtained in real-time with a multi-frequency probe.  PERFORMED EX:HBZJIR IMAGES ARCHIVED?: Yes LIMITATIONS:  Body habitus VIEWS USED: Subcostal 4 chamber INTERPRETATION: Cardiac activity present, Pericardial effusioin absent, Cardiac tamponade absent and Decreased contractility   Medications Ordered in ED Medications  carvedilol (COREG) tablet 6.25 mg (not administered)  pantoprazole (PROTONIX) EC tablet 40 mg (not administered)  atorvastatin (LIPITOR) tablet 80 mg (not administered)  tamsulosin (FLOMAX) capsule 0.4 mg (not administered)  aspirin EC tablet 81 mg (not administered)  sodium chloride flush (NS) 0.9 % injection 3 mL (not administered)  sodium chloride flush (NS) 0.9 % injection 3  mL (not administered)  0.9 %  sodium chloride infusion (not administered)  acetaminophen (TYLENOL) tablet 650 mg (not administered)  ondansetron (ZOFRAN) injection 4 mg (not administered)  enoxaparin (LOVENOX) injection 30 mg (not administered)  furosemide (LASIX) injection 40 mg (not administered)    insulin aspart (novoLOG) injection 0-9 Units (not administered)  insulin aspart (novoLOG) injection 0-5 Units (not administered)  pregabalin (LYRICA) capsule 150 mg (not administered)  famotidine (PEPCID) tablet 40 mg (not administered)  metoCLOPramide (REGLAN) tablet 10 mg (not administered)  cyclobenzaprine (FLEXERIL) tablet 5 mg (not administered)  lidocaine (LIDODERM) 5 % 1 patch (not administered)  folic acid (FOLVITE) tablet 1 mg (not administered)  methotrexate (RHEUMATREX) tablet 15 mg (not administered)  DULoxetine (CYMBALTA) DR capsule 60 mg (not administered)  iopamidol (ISOVUE-370) 76 % injection (100 mLs  Contrast Given 08/29/16 2218)  furosemide (LASIX) injection 40 mg (40 mg Intravenous Given 08/29/16 2317)     Initial Impression / Assessment and Plan / ED Course  I have reviewed the triage vital signs and the nursing notes.  Pertinent labs & imaging results that were available during my care of the patient were reviewed by me and considered in my medical decision making (see chart for details).     James Parsons is a 72 year old male with history of heart failure, COPD, anemia, high cholesterol who presents to the ED with chest pain and shortness of breath. Patient's vitals at time of arrival to the ED are unremarkable and patient is without fever. Patient with fairly sudden onset of shortness of breath associated with chest pain over the last several hours. Patient with EMS was given medications for COPD exacerbation with IV Solu-Medrol, IV magnesium, duonebs. Patient upon arrival of EMS was found to be hypoxic and started on BiPAP. Patient arrives with chest pain improved. Bedside ultrasound shows signs of volume overload with B lines in bilateral lung fields. Ejection fraction appears decreased as well but no obvious pericardial effusion, right heart strain. Patient with ECHO three days ago with EF of 25-30. Patient is normotensive and doubt hypertensive emergency. Concern  for possible flash pulmonary edema and possible PE given no high blood pressure and sudden onset of symptoms. Possible ACS. Patient had heart catheterization done 4 months ago with multiple areas of stenosis. No stent was placed. Patient has no history of PE or DVT. EKG with possible new T-wave changes in V5 and V6 but otherwise unremarkable and unchanged from prior. Patient to be continued on BiPAP and will get CBC, chest x-ray, CMP, BNP, lactic acid, CT PE study, INR. Patient with no peripheral edema but rales on examination, no wheezing. Doubt COPD exacerbation.  Patient with elevated BNP to 209. CT PE study with no signs of acute clot but signs of pulmonary edema consistent with CHF. No pneumonia, no pneumothorax, no dissection. Patient with lactic acidosis likely from work of breathing. Patient otherwise with unremarkable CBC and CMP with no significant anemia or electrolyte abnormalities. Initial troponin undetectable. Patient given IV Lasix and admitted to medicine service for further workup. Patient able to wean from BiPaP and placed on 4 L of O2.   Final Clinical Impressions(s) / ED Diagnoses   Final diagnoses:  Shortness of breath  Hypoxia  Acute on chronic congestive heart failure, unspecified heart failure type (HCC)  Elevated troponin  Chest pain, unspecified type  Acute pulmonary edema (HCC)  Lactic acidosis    New Prescriptions Current Discharge Medication List       Lennice Sites, DO  08/30/16 0131    Lennice Sites, DO 08/30/16 0132    Tegeler, Gwenyth Allegra, MD 09/06/16 1515

## 2016-08-29 NOTE — Progress Notes (Signed)
RT NOTE:  Pt arrived via EMS on CPAP. Pt switched to BIPAP. Pt tolerating @ this time.

## 2016-08-29 NOTE — H&P (Addendum)
History and Physical    GRADIE OHM LKG:[id number] DOB: 1945-08-09 DOA: 08/29/2016  Referring MD/NP/PA: Lennice Sites, MD(Resident) PCP: Unk Pinto, MD  Patient coming from: Via EMS   Chief Complaint:  "Couldn't get any oxygen"  HPI: James Parsons is a 71 y.o. male with medical history significant of systolic CHF last EF 66-44% on 5/18, COPD, anemia, chronic back pain s/p spinal simulator and fentanyl pump, and DM type II; who presents with complaints of shortness of breath. Patient notes intermittent feeling short of breath throughout the day, but could rest with improvement of symptoms. However, around 5-6 pm while mowing lawn symptoms acutely worsened where he was unable to catch his breath. During this time he had acute onset of chest discomfort. Other associated symptoms include note of progressive worsening abdominal distention. Patient denies having any significant leg swelling, palpitations, loss of consciousness, nausea, vomiting, or diarrhea. Last hospitalized in 04/2016 for CHF exacerbation with EF noted to be 40-45% with grade 1 diastolic dysfunction. At that time he had been sent home on 40 mg of Lasix. He was readmitted for dehydration 1 week later and at that time discharged home on 20 mg of Lasix which he's been on ever since.   ED Course: On admission into the emergency department patient was initially tachycardic heart rates 99.13, respirations 13-28, and all other vital signs relatively within normal limits. Labs revealed WBC 12, hemoglobin 10.9, glucose 244, BNP 209.7, troponin 0, and lactic acid 2.98. CT angiogram showed stable mild cardiomegaly with worsening pulmonary edema and increase small pleural effusions.  Review of Systems: As per HPI otherwise 10 point review of systems negative.   Past Medical History:  Diagnosis Date  . Allergy   . Anemia   . Arthritis   . Borderline hypertension   . CHF (congestive heart failure) ([place])   . Clotting disorder (HCC)     bleeds easily-no dx  . COPD (chronic obstructive pulmonary disease) ([place])    denies  . Depression    hx of   . Diabetes mellitus   . Diverticulosis of colon   . Full dentures   . GERD (gastroesophageal reflux disease)   . Hx of colonic polyps   . Hyperlipidemia   . Hypertension   . Lipoma   . Low back pain   . Mental disorder   . Obesity   . Pneumonia    hx  . RSD (reflex sympathetic dystrophy)   . Venous insufficiency     Past Surgical History:  Procedure Laterality Date  . BACK SURGERY  2005,2007  . C3-4 anterior cervical discectomy and fusion with plating at c3-4  05/2006   Dr. Arnoldo Morale  . CARDIAC CATHETERIZATION N/A 04/25/2016   Procedure: Right/Left Heart Cath and Coronary Angiography;  Surgeon: Peter M Martinique, MD;  Location: [facility];  Service: Cardiovascular;  Laterality: N/A;  . COLONOSCOPY    . COLONOSCOPY W/ POLYPECTOMY    . excision of lipoma from right olecranon area  11/2000   Dr. Rise [name]  . KNEE ARTHROSCOPY     right knee  . KNEE ARTHROSCOPY Right 06/06/2014   Procedure: ARTHROSCOPY RIGHT KNEE WITH REMOVAL OF FIBROUS BANDS;  Surgeon: Kerin Salen, MD;  Location: [facility];  Service: Orthopedics;  Laterality: Right;  . LUMBAR LAMINECTOMY/DECOMPRESSION MICRODISCECTOMY N/A 01/08/2014   Procedure: L4-S1 Decompression with removal and reimplantation of spinal cord stimulator battery ;  Surgeon: Melina Schools, MD;  Location: [facility];  Service: Orthopedics;  Laterality:  N/A;  . microdiscectomy and decompression  05/2002   Dr. Tonita Cong  . morphine pump  2009   due to reaction to morphine/changed to fentanyl  . PAIN PUMP IMPLANTATION     with fentanyl  . spinal cord stimulator implanted     for pain per Dr. Maryruth Eve  . subcut pain pump implanted    . TONSILLECTOMY    . TOTAL KNEE ARTHROPLASTY  02/29/2012   Procedure: TOTAL KNEE ARTHROPLASTY;  Surgeon: Kerin Salen, MD;  Location: [facility];  Service: Orthopedics;  Laterality: Right;     reports  that he quit smoking about 14 years ago. His smoking use included Cigarettes. He has a 120.00 pack-year smoking history. He has never used smokeless tobacco. He reports that he does not drink alcohol or use drugs.  Allergies  Allergen Reactions  . Codeine Itching  . Morphine And Related Other (See Comments)    hallucinations    Family History  Problem Relation Age of Onset  . Cancer Father   . Colon cancer Neg Hx   . Esophageal cancer Neg Hx   . Stomach cancer Neg Hx   . Rectal cancer Neg Hx     Prior to Admission medications   Medication Sig Start Date End Date Taking? Authorizing Provider  albuterol (VENTOLIN HFA) 108 (90 Base) MCG/ACT inhaler Inhale 2 puffs into the lungs every 4 (four) hours as needed for wheezing or shortness of breath. 06/27/16  Yes Vicie Mutters, PA-C  aspirin EC 81 MG EC tablet Take 1 tablet (81 mg total) by mouth daily. Patient taking differently: Take 81 mg by mouth at bedtime.  04/27/16  Yes Hongalgi, Lenis Dickinson, MD  atorvastatin (LIPITOR) 80 MG tablet Take 1/2 to 1 tablet daily for cholesterol as directed Patient taking differently: Take 80 mg by mouth at bedtime. for cholesterol as directed 03/29/16 09/27/16 Yes Unk Pinto, MD  carvedilol (COREG) 6.25 MG tablet TAKE 1 TABLET BY MOUTH TWICE A DAY WITH A MEAL 08/29/16  Yes [name], MD  Cholecalciferol (VITAMIN D-3) 5000 UNITS TABS Take 5,000 Units by mouth 2 (two) times daily.   Yes [provider]  cyclobenzaprine (FLEXERIL) 5 MG tablet Take 1 tablet (5 mg total) by mouth 3 (three) times daily as needed for muscle spasms. Muscle spasm. 05/01/16  Yes Thurnell Lose, MD  DULoxetine (CYMBALTA) 60 MG capsule Take 60 mg by mouth daily.  10/09/12  Yes [provider]  folic acid (FOLVITE) 1 MG tablet Take 1 mg by mouth daily. 08/13/14  Yes [provider]  furosemide (LASIX) 20 MG tablet Take 1 tablet (20 mg total) by mouth daily. 05/01/16  Yes Thurnell Lose, MD  glimepiride  (AMARYL) 4 MG tablet TAKE 1 TABLET TWICE DAILY WITH MEALS. DO NOT TAKE IF YOU DO NOT EAT. 06/26/16  Yes Unk Pinto, MD  lidocaine (LIDODERM) 5 % Place 1 patch onto the skin daily as needed (pain). 04/26/16  Yes Hongalgi, Lenis Dickinson, MD  metFORMIN (GLUCOPHAGE) 500 MG tablet Take 1,000 mg by mouth 2 (two) times daily. 08/24/16  Yes [provider]  methotrexate (RHEUMATREX) 2.5 MG tablet Take 15 mg by mouth every Friday. 6 tablets 08/13/14  Yes [provider]  metoCLOPramide (REGLAN) 10 MG tablet Take 1 tablet (10 mg total) by mouth 3 (three) times daily before meals. Patient taking differently: Take 10 mg by mouth 2 (two) times daily before a meal.  05/04/16  Yes Vicie Mutters, PA-C  pantoprazole (PROTONIX) 40  MG tablet Take 1 tablet (40 mg total) by mouth 2 (two) times daily before a meal. 05/05/16 05/05/17 Yes Irene Shipper, MD  pregabalin (LYRICA) 150 MG capsule Take 150 mg by mouth 2 (two) times daily.   Yes [provider]  PRESCRIPTION MEDICATION by Intrathecal route continuous. Fentanyl 0.05mg /ml solution . Daily dose 0.84 ml Compounded at [facility] [phone] Pump is refilled monthly   Yes [provider]  ranitidine (ZANTAC) 300 MG tablet TAKE 1 TABLET BY MOUTH AT BEDTIME. 08/24/16  Yes Vicie Mutters, PA-C  tamsulosin (FLOMAX) 0.4 MG CAPS capsule Take 1 capsule (0.4 mg total) by mouth daily after supper. 08/23/16  Yes Vicie Mutters, PA-C  Tapentadol HCl (NUCYNTA) 100 MG TABS Take 100 mg by mouth See admin instructions. Take 1 tablet (100 mg)n by mouth twice daily, may also take 1 tablet during the day as needed for pain   Yes [provider]  docusate sodium 100 MG CAPS Take 100 mg by mouth 2 (two) times daily. Patient not taking: Reported on 08/29/2016 04/09/14   Jonetta Osgood, MD  metFORMIN (GLUCOPHAGE XR) 500 MG 24 hr tablet Take 2 tablets 2 x / day for Diabetes Patient not taking: Reported on 08/29/2016 08/26/16  02/26/17  Unk Pinto, MD  ondansetron (ZOFRAN ODT) 4 MG disintegrating tablet Take 1 tablet (4 mg total) by mouth every 8 (eight) hours as needed for nausea or vomiting. Patient not taking: Reported on 08/29/2016 07/26/16   Esaw Grandchild, MD  sucralfate (CARAFATE) 1 g tablet Take 1 tablet (1 g total) by mouth 4 (four) times daily. Patient not taking: Reported on 08/29/2016 05/04/16 05/04/17  Vicie Mutters, PA-C    Physical Exam:  Constitutional: Elderly male in some moderate discomfort on nasal cannula oxygen. Vitals:   08/29/16 2130 08/29/16 2200 08/29/16 2215 08/29/16 2245  BP: 116/77 122/78 122/77 123/77  Pulse: (!) 107 (!) 101 (!) 102 99  Resp: 15 13 (!) 28 13  SpO2: 96% 100% 100% 99%  Weight:      Height:       Eyes: PERRL, lids and conjunctivae normal ENMT: Mucous membranes are moist. Posterior pharynx clear of any exudate or lesions.   Neck: normal, supple, no masses, no thyromegaly Respiratory: Decreased overall aeration with positive crackles appreciated both lung fields. Patient to him in shorten sinuses. Cardiovascular: Tachycardic without appreciated murmur. No significant lower extremity swelling noted and peripheral pulses intact. Abdomen: Mild abdominal distention noted with no tenderness. Device noted in the right lower quadrant. No hepatosplenomegaly. Bowel sounds positive.  Musculoskeletal: no clubbing / cyanosis. No joint deformity upper and lower extremities. Good ROM, no contractures. Normal muscle tone.  Skin: no rashes, lesions, ulcers. No induration Neurologic: CN 2-12 grossly intact. Sensation intact, DTR normal. Strength 5/5 in all 4.  Psychiatric: Normal judgment and insight. Alert and oriented x 3. Normal mood.     Labs on Admission: I have personally reviewed following labs and imaging studies  CBC:  Recent Labs Lab 08/29/16 2110  WBC 12.0*  NEUTROABS 9.8*  HGB 10.9*  HCT 34.1*  MCV 86.1  PLT 785   Basic Metabolic Panel:  Recent  Labs Lab 08/29/16 2110  NA 136  K 3.5  CL 101  CO2 24  GLUCOSE 244*  BUN 14  CREATININE 0.66  CALCIUM 8.9   GFR: Estimated Creatinine Clearance: 56.5 mL/min (by C-G formula based on SCr of 0.66 mg/dL). Liver Function Tests:  Recent Labs Lab 08/29/16 2110  AST 25  ALT 18  ALKPHOS 98  BILITOT 0.9  PROT 6.8  ALBUMIN 3.8   No results for input(s): LIPASE, AMYLASE in the last 168 hours. No results for input(s): AMMONIA in the last 168 hours. Coagulation Profile:  Recent Labs Lab 08/29/16 2110  INR 1.04   Cardiac Enzymes: No results for input(s): CKTOTAL, CKMB, CKMBINDEX, TROPONINI in the last 168 hours. BNP (last 3 results) No results for input(s): PROBNP in the last 8760 hours. HbA1C: No results for input(s): HGBA1C in the last 72 hours. CBG: No results for input(s): GLUCAP in the last 168 hours. Lipid Profile: No results for input(s): CHOL, HDL, LDLCALC, TRIG, CHOLHDL, LDLDIRECT in the last 72 hours. Thyroid Function Tests: No results for input(s): TSH, T4TOTAL, FREET4, T3FREE, THYROIDAB in the last 72 hours. Anemia Panel: No results for input(s): VITAMINB12, FOLATE, FERRITIN, TIBC, IRON, RETICCTPCT in the last 72 hours. Urine analysis:    Component Value Date/Time   COLORURINE YELLOW 04/29/2016 [place] 04/29/2016 1634   LABSPEC 1.014 04/29/2016 1634   PHURINE 6.0 04/29/2016 1634   GLUCOSEU NEGATIVE 04/29/2016 1634   HGBUR NEGATIVE 04/29/2016 1634   BILIRUBINUR NEGATIVE 04/29/2016 [place] 04/29/2016 1634   PROTEINUR NEGATIVE 04/29/2016 1634   UROBILINOGEN 0.2 04/12/2014 1555   NITRITE NEGATIVE 04/29/2016 1634   LEUKOCYTESUR NEGATIVE 04/29/2016 1634   Sepsis Labs: No results found for this or any previous visit (from the past 240 hour(s)).   Radiological Exams on Admission: Ct Angio Chest Pe W Or Wo Contrast  Result Date: 08/29/2016 CLINICAL DATA:  Midsternal chest pain, shortness of breath this evening. Hypoxic.  History of diabetes, COPD, CHF. EXAM: CT ANGIOGRAPHY CHEST WITH CONTRAST TECHNIQUE: Multidetector CT imaging of the chest was performed using the standard protocol during bolus administration of intravenous contrast. Multiplanar CT image reconstructions and MIPs were obtained to evaluate the vascular anatomy. CONTRAST:  100 cc Isovue 370 COMPARISON:  Chest radiograph Aug 29, 2016 at 2102 hours and CT chest April 22, 2016 FINDINGS: CARDIOVASCULAR: Adequate contrast opacification of the pulmonary artery's. Main pulmonary artery is not enlarged. No pulmonary arterial filling defects to the level of the subsegmental branches. The heart is mildly enlarged. Mild coronary artery calcifications. No pericardial effusion. No pericardial effusion. Thoracic aorta is normal course and caliber, mild calcific atherosclerosis. MEDIASTINUM/NODES: Greater than expected number of non pathologically enlarged mediastinal lymph nodes are likely reactive and, unchanged. LUNGS/PLEURA: Tracheobronchial tree is patent, no pneumothorax. Bronchial wall thickening associated with edema. Increased small bilateral pleural effusions. Interlobular septal thickening and heterogeneous lung attenuation. Patchy bibasilar airspace opacities have increased. 4 mm RIGHT upper lobe sub solid pulmonary nodule, 3 mm subpleural RIGHT upper lobe pulmonary nodule. Previous LEFT upper lobe pulmonary nodule has resolved. UPPER ABDOMEN: Included view of the abdomen is unremarkable. MUSCULOSKELETAL: Visualized soft tissues and included osseous structures are nonacute. S-type scoliosis. Thoracic spinal cord stimulator lead within mid dorsal canal. Subacute to old RIGHT eighth through tenth rib fractures. Review of the MIP images confirms the above findings. IMPRESSION: No acute pulmonary embolism. Stable mild cardiomegaly. Worsening pulmonary edema. Increased small pleural effusions and consolidation in the lung bases favoring confluent edema. Resolution of LEFT  upper lobe pulmonary nodule, stable sub solid RIGHT pulmonary nodules measuring to 4 mm. No follow-up needed if patient is low-risk (and has no known or suspected primary neoplasm). Non-contrast chest CT can be considered in 12 months if patient is high-risk. This recommendation follows the consensus statement: Guidelines for  Management of Incidental Pulmonary Nodules Detected on CT Images: From the Fleischner Society 2017; Radiology 2017; 284:228-243. Electronically Signed   By: Elon Alas M.D.   On: 08/29/2016 22:52   Dg Chest Portable 1 View  Result Date: 08/29/2016 CLINICAL DATA:  Shortness of Breath EXAM: PORTABLE CHEST 1 VIEW COMPARISON:  04/26/2016 FINDINGS: Cardiac shadow is mildly enlarged. Vascular congestion is noted with small pleural effusions. No focal confluent infiltrate is seen. Mild interstitial edema is noted. Spinal stimulators are again noted. Previously seen vague nodular density over the right lung base is obscured by the parenchymal changes. IMPRESSION: Mild changes of CHF. Electronically Signed   By: Inez Catalina M.D.   On: 08/29/2016 21:17    EKG: Independently reviewed. Tachycardia with T-wave inversions in V5 and V6  Assessment/Plan Acute respiratory failure with hypoxia 2/2 combined systolic and diastolic CHF exacerbation: Acute on chronic. Patient presents with acute shortness of breath requiring . BNP elevated 209.1 which is the highest that's been and imaging studies showing signs of pulmonary edema. Patient recently had echocardiogram which showed EF of 25-30% on 5/18. Followed by Dr. Gwenlyn Found of cardiology. - Admit to telemetry bed - Continuous pulse oximetry with nasal cannula oxygen to keep O2 sats greater than 92% - CHF order set initiated - Strict I&Os - Lasix 40 mg IV bid - Consult cardiology in a.m., for recommendations on optimization of medication regimen given decrease in patient's overall function from 40-45%.  Chest pain with elevated troponin:  Acute. Patient reports chest pain symptoms with acute shortness of breath. Suspect mildly elevated troponin secondary to demand. - trend cardiac enzymes - Echocardiogram recently performed  Essential hypertension - Continue Coreg  Lactic acidosis: Acute initial lactic acid 2.98, but trending down following diuretics. Less likely infectious in nature. - Trend lactic acid levels  Chronic back pain: Patient status post spinal stimulator and fentanyl pump. - Continue Lyrica and Cymbalta  Anemia: Hemoglobin 10.9 on admission which appears to be around patient's baseline. - Continue to monitor  Hyperglycemia with diabetes mellitus type 2: Appears to be uncontrolled last hemoglobin A1c was 9.5 in 04/2016 - Hypoglycemic protocol - hold metformin and amaryl - CBGs with Sensitive SSI   Nonobstructive CAD : Patient s/p cardiac catheterization in 04/2016 showing obstructive disease. - Continue aspirin and statin  BPH - Continue Flomax  GERD - Continue Pharmacy substitution for Pepcid   DVT prophylaxis: Lovenox Code Status: Full Family Communication: Discussed plan of care with patient and family present at bedside Disposition Plan: Discharge home in 2-3 days Consults called: None Admission status: Elevation  Norval Morton MD [facility] Pager [phone]  If 7PM-7AM, please contact night-coverage [email or web address] Password [facility]  08/29/2016, 11:22 PM

## 2016-08-29 NOTE — Progress Notes (Signed)
RT NOTE:  Pt transported to CT without event.  

## 2016-08-29 NOTE — ED Notes (Signed)
Dr.Tegeler at bedside updating family on CT results

## 2016-08-29 NOTE — ED Notes (Signed)
Pt taken to CT.

## 2016-08-30 DIAGNOSIS — E872 Acidosis, unspecified: Secondary | ICD-10-CM | POA: Diagnosis present

## 2016-08-30 DIAGNOSIS — E0865 Diabetes mellitus due to underlying condition with hyperglycemia: Secondary | ICD-10-CM

## 2016-08-30 DIAGNOSIS — J9621 Acute and chronic respiratory failure with hypoxia: Secondary | ICD-10-CM

## 2016-08-30 LAB — TROPONIN I
Troponin I: 0.05 ng/mL (ref <0.03)
Troponin I: <0.03 ng/mL (ref <0.03)

## 2016-08-30 LAB — CBC WITH DIFFERENTIAL/PLATELET
Basophils Absolute: 0 10*3/uL (ref 0.0–0.1)
Basophils Relative: 0 %
Eosinophils Absolute: 0 10*3/uL (ref 0.0–0.7)
Eosinophils Relative: 0 %
HEMATOCRIT: 34.4 % — AB (ref 39.0–52.0)
Hemoglobin: 11 g/dL — ABNORMAL LOW (ref 13.0–17.0)
LYMPHS PCT: 11 %
Lymphs Abs: 0.6 10*3/uL — ABNORMAL LOW (ref 0.7–4.0)
MCH: 27 pg (ref 26.0–34.0)
MCHC: 32 g/dL (ref 30.0–36.0)
MCV: 84.3 fL (ref 78.0–100.0)
Monocytes Absolute: 0 10*3/uL — ABNORMAL LOW (ref 0.1–1.0)
Monocytes Relative: 0 %
NEUTROS ABS: 4.4 10*3/uL (ref 1.7–7.7)
NEUTROS PCT: 89 %
Platelets: 270 10*3/uL (ref 150–400)
RBC: 4.08 MIL/uL — AB (ref 4.22–5.81)
RDW: 17.2 % — ABNORMAL HIGH (ref 11.5–15.5)
WBC: 5 10*3/uL (ref 4.0–10.5)

## 2016-08-30 LAB — BASIC METABOLIC PANEL
ANION GAP: 13 (ref 5–15)
BUN: 13 mg/dL (ref 6–20)
CO2: 25 mmol/L (ref 22–32)
Calcium: 9.1 mg/dL (ref 8.9–10.3)
Chloride: 99 mmol/L — ABNORMAL LOW (ref 101–111)
Creatinine, Ser: 0.79 mg/dL (ref 0.61–1.24)
GFR calc Af Amer: >60 mL/min (ref >60)
GFR calc non Af Amer: >60 mL/min (ref >60)
Glucose, Bld: 228 mg/dL — ABNORMAL HIGH (ref 65–99)
POTASSIUM: 3.9 mmol/L (ref 3.5–5.1)
Sodium: 137 mmol/L (ref 135–145)

## 2016-08-30 LAB — GLUCOSE, CAPILLARY
GLUCOSE-CAPILLARY: 288 mg/dL — AB (ref 65–99)
GLUCOSE-CAPILLARY: 139 mg/dL — AB (ref 65–99)
Glucose-Capillary: 240 mg/dL — ABNORMAL HIGH (ref 65–99)
Glucose-Capillary: 233 mg/dL — ABNORMAL HIGH (ref 65–99)
Glucose-Capillary: 254 mg/dL — ABNORMAL HIGH (ref 65–99)
Glucose-Capillary: 193 mg/dL — ABNORMAL HIGH (ref 65–99)

## 2016-08-30 LAB — LACTIC ACID, PLASMA: Lactic Acid, Venous: 2.9 mmol/L (ref 0.5–1.9)

## 2016-08-30 MED ORDER — LISINOPRIL 5 MG PO TABS
5.0000 mg | ORAL_TABLET | Freq: Every day | ORAL | Status: DC
  Administered 2016-08-30 – 2016-08-31 (×2): 5 mg via ORAL
  Filled 2016-08-30 (×3): qty 1

## 2016-08-30 MED ORDER — SPIRONOLACTONE 25 MG PO TABS
12.5000 mg | ORAL_TABLET | Freq: Every day | ORAL | Status: DC
  Administered 2016-08-30 – 2016-09-01 (×3): 12.5 mg via ORAL
  Filled 2016-08-30 (×3): qty 1

## 2016-08-30 MED ORDER — PREGABALIN 25 MG PO CAPS
150.0000 mg | ORAL_CAPSULE | Freq: Two times a day (BID) | ORAL | Status: DC
  Administered 2016-08-30 – 2016-09-01 (×6): 150 mg via ORAL
  Filled 2016-08-30 (×3): qty 2
  Filled 2016-08-30: qty 1
  Filled 2016-08-30: qty 2
  Filled 2016-08-30: qty 1

## 2016-08-30 MED ORDER — CYCLOBENZAPRINE HCL 10 MG PO TABS
5.0000 mg | ORAL_TABLET | Freq: Three times a day (TID) | ORAL | Status: DC | PRN
Start: 2016-08-30 — End: 2016-09-01

## 2016-08-30 MED ORDER — LIDOCAINE 5 % EX PTCH: 1.0000 | MEDICATED_PATCH | Freq: Every day | CUTANEOUS | Status: DC | PRN

## 2016-08-30 MED ORDER — INSULIN GLARGINE 100 UNIT/ML ~~LOC~~ SOLN
10.0000 [IU] | Freq: Every day | SUBCUTANEOUS | Status: DC
  Administered 2016-08-30 – 2016-08-31 (×2): 10 [IU] via SUBCUTANEOUS
  Filled 2016-08-30 (×3): qty 0.1

## 2016-08-30 MED ORDER — METOCLOPRAMIDE HCL 10 MG PO TABS
10.0000 mg | ORAL_TABLET | Freq: Two times a day (BID) | ORAL | Status: DC
  Administered 2016-08-30 – 2016-09-01 (×5): 10 mg via ORAL
  Filled 2016-08-30 (×5): qty 1

## 2016-08-30 MED ORDER — METHOTREXATE 2.5 MG PO TABS: 15.0000 mg | ORAL_TABLET | ORAL | Status: DC

## 2016-08-30 MED ORDER — LISINOPRIL 10 MG PO TABS
10.0000 mg | ORAL_TABLET | Freq: Every day | ORAL | Status: DC
Start: 2016-08-30 — End: 2016-08-30

## 2016-08-30 MED ORDER — FAMOTIDINE 20 MG PO TABS
40.0000 mg | ORAL_TABLET | Freq: Every day | ORAL | Status: DC
  Administered 2016-08-30 – 2016-08-31 (×3): 40 mg via ORAL
  Filled 2016-08-30 (×3): qty 2

## 2016-08-30 MED ORDER — DULOXETINE HCL 60 MG PO CPEP
60.0000 mg | ORAL_CAPSULE | Freq: Every day | ORAL | Status: DC
  Administered 2016-08-30 – 2016-09-01 (×3): 60 mg via ORAL
  Filled 2016-08-30 (×3): qty 1

## 2016-08-30 MED ORDER — FOLIC ACID 1 MG PO TABS
1.0000 mg | ORAL_TABLET | Freq: Every day | ORAL | Status: DC
  Administered 2016-08-30 – 2016-09-01 (×3): 1 mg via ORAL
  Filled 2016-08-30 (×3): qty 1

## 2016-08-30 NOTE — Care Management Note (Signed)
Case Management Note  Patient Details  Name: James Parsons MRN: [id number] Date of Birth: 24-Jun-1945  Subjective/Objective:                 Spoke with patient at the bedside. He states he lives at home with his wife, has a RW and a cane. Describes himself as independent, drives, denies problems obtaining or paying for mediations. PCP [facility], [facility]   Action/Plan:   Expected Discharge Date:                  Expected Discharge Plan:  Home/Self Care  In-House Referral:     Discharge planning Services  CM Consult  Post Acute Care Choice:    Choice offered to:     DME Arranged:    DME Agency:     HH Arranged:    [place] Agency:     Status of Service:  In process, will continue to follow  If discussed at Long Length of Stay Meetings, dates discussed:    Additional Comments:  Carles Collet, RN 08/30/2016, 1:59 PM

## 2016-08-30 NOTE — Progress Notes (Signed)
CRITICAL VALUE ALERT  Critical value received:  Troponin  0.05  Date of notification:  08/30/2016  Time of notification:  0145  Critical value read back:Yes.    Nurse who received alert: [place]  MD notified (1st page):  Rahkeem, Senft.  Time of first page:  0145  MD notified (2nd page):  Time of second page:  Responding MD:  Molly, Maselli.  Time MD [id number]

## 2016-08-30 NOTE — Progress Notes (Signed)
Skin assement- pt has implantable fentanyl pump on lower right side of abdomen. Pt states it was just refilled and needs to be filled every 28 days. Pt also has spinal stimulator and states it will be removed once discharged  James Parsons

## 2016-08-30 NOTE — Progress Notes (Signed)
Inpatient Diabetes Program Recommendations  AACE/ADA: New Consensus Statement on Inpatient Glycemic Control (2015)  Target Ranges:  Prepandial:   less than 140 mg/dL      Peak postprandial:   less than 180 mg/dL (1-2 hours)      Critically ill patients:  140 - 180 mg/dL   Results for James Parsons, James Parsons (MRN [id number]) as of 08/30/2016 08:22  Ref. Range 08/30/2016 01:58 08/30/2016 07:58  Glucose-Capillary Latest Ref Range: 65 - 99 mg/dL 240 (H) 233 (H)  Results for James Parsons, James Parsons (MRN [id number]) as of 08/30/2016 08:22  Ref. Range 04/22/2016 19:50  Hemoglobin A1C Latest Ref Range: 4.8 - 5.6 % 9.5 (H)   Review of Glycemic Control  Diabetes history: DM2 Outpatient Diabetes medications: Metformin XR 1000 mg BID Current orders for Inpatient glycemic control: Novolog 0-9 units TID with meals, Novolog 0-5 units QHS  Inpatient Diabetes Program Recommendations: Insulin - Basal: While inpatient, please consider ordering Lantus 9 units Q24H (starting now; based on 86.7 kg x 0.1 units). HgbA1C: Please consider ordering an A1C to evaluate glycemic control over the past 2-3 months.  Thanks, Barnie Alderman, RN, MSN, CDE Diabetes Coordinator Inpatient Diabetes Program [phone] (Team Pager from 8am to 5pm)

## 2016-08-30 NOTE — Progress Notes (Signed)
Pt admitted from the ED assisted by the NT per stretcher with O2 @ 3L per [place]. Alert, oriented x 3, denies chest pain, denies nausea and vomiting, not in respiratory distress. Oriented to room and call bell. Educated the fall prevention safety plan with understanding. Bed alarm on and call bell within reach. Will monitor pt   08/30/16 0054  Vitals  Temp 98.1 F (36.7 C)  Temp Source Oral  BP (!) 146/88  BP Location Left Arm  BP Method Automatic  Patient Position (if appropriate) Sitting  Pulse Rate (!) 118  Pulse Rate Source Dinamap  Resp 20  Oxygen Therapy  SpO2 99 %  O2 Device Nasal Cannula  O2 Flow Rate (L/min) 2 L/min  Height and Weight  Height 5\' 9"  (1.753 m)  Weight 86.7 kg (191 lb 3.2 oz)  Type of Scale Used Standing  Type of Weight Actual  BSA (Calculated - sq m) 2.05 sq meters  BMI (Calculated) 28.3  Weight in (lb) to have BMI = 25 168.9

## 2016-08-30 NOTE — Progress Notes (Addendum)
PROGRESS NOTE                                                                                                                                                                                                             Patient Demographics:    James Parsons, is a 71 y.o. male, DOB - 1945/10/14, FFM:[id number]  Admit date - 08/29/2016   Admitting Physician Norval Morton, MD  Outpatient Primary MD for the patient is Unk Pinto, MD  LOS - 1  Outpatient Specialists: Dr Gwenlyn Found  Chief Complaint  Patient presents with  . Respiratory Distress  . Chest Pain       Brief Narrative   71 year old male with systolic CHF (recent 2-D echo shows worsened EF of 25-30%), chronic back pain spinal  stimulator and fentanyl pump, type 2 diabetes mellitus presented with acute onset of shortness of breath. Denies any orthopnea, PND chest pain, palpitations, abdominal pain, nausea, vomiting, dizziness, bowel or urination. No recent illness or travel. Patient compliant with his medications. He was hospitalized in January 2018 for acute on chronic systolic CHF and discharged on Lasix. He was readmitted 1 week back for orthostasis and dehydration. He also had cardiac cath around that time which showed nonobstructive CAD.  At baseline patient is quite active and able to ambulate good distance.  In the ED he was tachycardic and tachypneic. Blood work showed WBC of 12, elevated blood glucose, mildly elevated BNP and lactic acid of 2.98. CT angiogram of the chest showed mild cardiomegaly and pulmonary edema with small bilateral pleural effusions. Admitted to telemetry.   Subjective:   Breathing improving but still dyspneic on exertion.   Assessment  & Plan :    Principal Problem:   Acute on chronic respiratory failure with hypoxia (HCC) Secondary to acute on chronic systolic CHF. Recent 2-D echo with EF of 25-30% with diffuse  akinesis. -Continue O2 via nasal cannula. Symptoms slowly improving but respiratory function still not at baseline. -Continue IV Lasix 40 mg every 12 hours. Has diuresed 2.7 L since admission overnight. -Strict I/O and daily weight. Continue aspirin, Coreg and statin. -Consult cardiology if symptoms unimproved. -Patient was discharged on low-dose ACE inhibitor back in January. -He did not receive a refill after 30 days. I have placed him back on low-dose lisinopril. Also added Aldactone given his  Low  EF.   Active Problems: Nonobstructive coronary artery disease Continue aspirin, statin and beta blocker.  Type 2 diabetes mellitus, uncontrolled with hyperglycemia Patient only on metformin. Will place him on bedtime Lantus. Continue sliding scale coverage. Check A1c.  GERD Continue PPI  Elevated lactic acid No signs of infection. Resolved.    Chronic arthritis Continue methotrexate.  Chronic low back pain Has spinal stimulator and fentanyl pump     Code Status : Full code  Family Communication  : Wife at bedside  Disposition Plan  : Home possibly in the next 48-72 hours if continues to diurese well  Barriers For Discharge : Active symptoms  Consults  :  None  Procedures  : None  DVT Prophylaxis  :  Lovenox   Lab Results  Component Value Date   PLT 270 08/30/2016    Antibiotics  :    Anti-infectives    None        Objective:   Vitals:   08/30/16 0030 08/30/16 0054 08/30/16 0518 08/30/16 0800  BP: (!) 149/93 (!) 146/88 118/65 106/60  Pulse: (!) 115 (!) 118 (!) 105 (!) 104  Resp: (!) 21 20 18 18   Temp:  98.1 F (36.7 C) 98 F (36.7 C) 97.9 F (36.6 C)  TempSrc:  Oral Oral Oral  SpO2: 97% 99% 95% 94%  Weight:  86.7 kg (191 lb 3.2 oz)    Height:  5\' 9"  (1.753 m)      Wt Readings from Last 3 Encounters:  08/30/16 86.7 kg (191 lb 3.2 oz)  08/17/16 86.2 kg (190 lb)  07/28/16 87.5 kg (193 lb)     Intake/Output Summary (Last 24 hours) at 08/30/16  1106 Last data filed at 08/30/16 1002  Gross per 24 hour  Intake              840 ml  Output             3600 ml  Net            -2760 ml     Physical Exam  [id number] male not in distress HEENT: Moist mucosa, supple neck Chest: Fine bibasilar crackles CVS: Normal S1 and S2, no murmurs GI: Soft, mild distention, nontender Musculoskeletal: Warm, no edema     Data Review:    CBC  Recent Labs Lab 08/29/16 2110 08/30/16 0514  WBC 12.0* 5.0  HGB 10.9* 11.0*  HCT 34.1* 34.4*  PLT 276 270  MCV 86.1 84.3  MCH 27.5 27.0  MCHC 32.0 32.0  RDW 17.2* 17.2*  LYMPHSABS 1.8 0.6*  MONOABS 0.2 0.0*  EOSABS 0.2 0.0  BASOSABS 0.0 0.0    Chemistries   Recent Labs Lab 08/29/16 2110 08/30/16 0514  NA 136 137  K 3.5 3.9  CL 101 99*  CO2 24 25  GLUCOSE 244* 228*  BUN 14 13  CREATININE 0.66 0.79  CALCIUM 8.9 9.1  AST 25  --   ALT 18  --   ALKPHOS 98  --   BILITOT 0.9  --    ------------------------------------------------------------------------------------------------------------------ No results for input(s): CHOL, HDL, LDLCALC, TRIG, CHOLHDL, LDLDIRECT in the last 72 hours.  Lab Results  Component Value Date   HGBA1C 9.5 (H) 04/22/2016   ------------------------------------------------------------------------------------------------------------------ No results for input(s): TSH, T4TOTAL, T3FREE, THYROIDAB in the last 72 hours.  Invalid input(s): FREET3 ------------------------------------------------------------------------------------------------------------------ No results for input(s): VITAMINB12, FOLATE, FERRITIN, TIBC, IRON, RETICCTPCT in the last 72 hours.  Coagulation profile  Recent Labs Lab 08/29/16 2110  INR  1.04    No results for input(s): DDIMER in the last 72 hours.  Cardiac Enzymes  Recent Labs Lab 08/29/16 2350 08/30/16 0514  TROPONINI 0.05* <0.03    ------------------------------------------------------------------------------------------------------------------    Component Value Date/Time   BNP 209.7 (H) 08/29/2016 2110    Inpatient Medications  Scheduled Meds: . aspirin EC  81 mg Oral QHS  . atorvastatin  80 mg Oral QHS  . carvedilol  6.25 mg Oral BID WC  . DULoxetine  60 mg Oral Daily  . enoxaparin (LOVENOX) injection  30 mg Subcutaneous Q24H  . famotidine  40 mg Oral QHS  . folic acid  1 mg Oral Daily  . furosemide  40 mg Intravenous BID  . insulin aspart  0-5 Units Subcutaneous QHS  . insulin aspart  0-9 Units Subcutaneous TID WC  . lisinopril  5 mg Oral Daily  . [START ON 09/02/2016] methotrexate  15 mg Oral Q Fri  . metoCLOPramide  10 mg Oral BID AC  . pantoprazole  40 mg Oral BID AC  . pregabalin  150 mg Oral BID  . sodium chloride flush  3 mL Intravenous Q12H  . spironolactone  12.5 mg Oral Daily  . tamsulosin  0.4 mg Oral QPC supper   Continuous Infusions: . sodium chloride     PRN Meds:.sodium chloride, acetaminophen, cyclobenzaprine, lidocaine, ondansetron (ZOFRAN) IV, sodium chloride flush  Micro Results No results found for this or any previous visit (from the past 240 hour(s)).  Radiology Reports Ct Angio Chest Pe W Or Wo Contrast  Result Date: 08/29/2016 CLINICAL DATA:  Midsternal chest pain, shortness of breath this evening. Hypoxic. History of diabetes, COPD, CHF. EXAM: CT ANGIOGRAPHY CHEST WITH CONTRAST TECHNIQUE: Multidetector CT imaging of the chest was performed using the standard protocol during bolus administration of intravenous contrast. Multiplanar CT image reconstructions and MIPs were obtained to evaluate the vascular anatomy. CONTRAST:  100 cc Isovue 370 COMPARISON:  Chest radiograph Aug 29, 2016 at 2102 hours and CT chest April 22, 2016 FINDINGS: CARDIOVASCULAR: Adequate contrast opacification of the pulmonary artery's. Main pulmonary artery is not enlarged. No pulmonary arterial  filling defects to the level of the subsegmental branches. The heart is mildly enlarged. Mild coronary artery calcifications. No pericardial effusion. No pericardial effusion. Thoracic aorta is normal course and caliber, mild calcific atherosclerosis. MEDIASTINUM/NODES: Greater than expected number of non pathologically enlarged mediastinal lymph nodes are likely reactive and, unchanged. LUNGS/PLEURA: Tracheobronchial tree is patent, no pneumothorax. Bronchial wall thickening associated with edema. Increased small bilateral pleural effusions. Interlobular septal thickening and heterogeneous lung attenuation. Patchy bibasilar airspace opacities have increased. 4 mm RIGHT upper lobe sub solid pulmonary nodule, 3 mm subpleural RIGHT upper lobe pulmonary nodule. Previous LEFT upper lobe pulmonary nodule has resolved. UPPER ABDOMEN: Included view of the abdomen is unremarkable. MUSCULOSKELETAL: Visualized soft tissues and included osseous structures are nonacute. S-type scoliosis. Thoracic spinal cord stimulator lead within mid dorsal canal. Subacute to old RIGHT eighth through tenth rib fractures. Review of the MIP images confirms the above findings. IMPRESSION: No acute pulmonary embolism. Stable mild cardiomegaly. Worsening pulmonary edema. Increased small pleural effusions and consolidation in the lung bases favoring confluent edema. Resolution of LEFT upper lobe pulmonary nodule, stable sub solid RIGHT pulmonary nodules measuring to 4 mm. No follow-up needed if patient is low-risk (and has no known or suspected primary neoplasm). Non-contrast chest CT can be considered in 12 months if patient is high-risk. This recommendation follows the consensus statement: Guidelines for Management of Incidental  Pulmonary Nodules Detected on CT Images: From the Fleischner Society 2017; Radiology 2017; [phone]. Electronically Signed   By: Elon Alas M.D.   On: 08/29/2016 22:52   Dg Chest Portable 1 View  Result Date:  08/29/2016 CLINICAL DATA:  Shortness of Breath EXAM: PORTABLE CHEST 1 VIEW COMPARISON:  04/26/2016 FINDINGS: Cardiac shadow is mildly enlarged. Vascular congestion is noted with small pleural effusions. No focal confluent infiltrate is seen. Mild interstitial edema is noted. Spinal stimulators are again noted. Previously seen vague nodular density over the right lung base is obscured by the parenchymal changes. IMPRESSION: Mild changes of CHF. Electronically Signed   By: Inez Catalina M.D.   On: 08/29/2016 21:17    Time Spent in minutes  35   Louellen Molder M.D on 08/30/2016 at 11:06 AM  Between 7am to 7pm - Pager - [phone]  After 7pm go to [email or web address] - password [facility]  [facility] -  Office  [phone]

## 2016-08-30 NOTE — Progress Notes (Signed)
CRITICAL VALUE ALERT  Critical value received:  Lactic acid 2.9  Date of notification:  08/30/2016  Time of notification:  0624  Critical value read back:Yes.    Nurse who received alert:  [place]  MD notified (1st page):  Eriberto, [place].  Time of first page:  0624  MD notified (2nd page):  Time of second page:  Responding MD:  Coby, [place].  Time MD responded:  [phone]

## 2016-08-31 DIAGNOSIS — I509 Heart failure, unspecified: Secondary | ICD-10-CM

## 2016-08-31 DIAGNOSIS — I5043 Acute on chronic combined systolic (congestive) and diastolic (congestive) heart failure: Secondary | ICD-10-CM

## 2016-08-31 DIAGNOSIS — J810 Acute pulmonary edema: Secondary | ICD-10-CM

## 2016-08-31 DIAGNOSIS — D649 Anemia, unspecified: Secondary | ICD-10-CM

## 2016-08-31 DIAGNOSIS — E1121 Type 2 diabetes mellitus with diabetic nephropathy: Secondary | ICD-10-CM

## 2016-08-31 DIAGNOSIS — J9621 Acute and chronic respiratory failure with hypoxia: Secondary | ICD-10-CM

## 2016-08-31 LAB — BASIC METABOLIC PANEL
Anion gap: 10 (ref 5–15)
BUN: 22 mg/dL — ABNORMAL HIGH (ref 6–20)
CALCIUM: 9.2 mg/dL (ref 8.9–10.3)
CHLORIDE: 100 mmol/L — AB (ref 101–111)
CO2: 28 mmol/L (ref 22–32)
CREATININE: 0.91 mg/dL (ref 0.61–1.24)
GFR calc non Af Amer: >60 mL/min (ref >60)
Glucose, Bld: 158 mg/dL — ABNORMAL HIGH (ref 65–99)
Potassium: 3.9 mmol/L (ref 3.5–5.1)
SODIUM: 138 mmol/L (ref 135–145)

## 2016-08-31 LAB — HEMOGLOBIN A1C
HEMOGLOBIN A1C: 6.9 % — AB (ref 4.8–5.6)
Mean Plasma Glucose: 151 mg/dL

## 2016-08-31 LAB — GLUCOSE, CAPILLARY
GLUCOSE-CAPILLARY: 150 mg/dL — AB (ref 65–99)
GLUCOSE-CAPILLARY: 162 mg/dL — AB (ref 65–99)
Glucose-Capillary: 142 mg/dL — ABNORMAL HIGH (ref 65–99)
Glucose-Capillary: 202 mg/dL — ABNORMAL HIGH (ref 65–99)

## 2016-08-31 MED ORDER — POTASSIUM CHLORIDE CRYS ER 20 MEQ PO TBCR
20.0000 meq | EXTENDED_RELEASE_TABLET | Freq: Once | ORAL | Status: AC
  Administered 2016-08-31: 20 meq via ORAL
  Filled 2016-08-31: qty 1

## 2016-08-31 MED ORDER — ENOXAPARIN SODIUM 40 MG/0.4ML ~~LOC~~ SOLN
40.0000 mg | SUBCUTANEOUS | Status: DC
  Administered 2016-08-31: 40 mg via SUBCUTANEOUS
  Filled 2016-08-31: qty 0.4

## 2016-08-31 NOTE — Progress Notes (Signed)
PROGRESS NOTE                                                                                                                                                                                                             Patient Demographics:    James Parsons, is a 71 y.o. male, DOB - 16-Jun-1945, HBZ:[id number]  Admit date - 08/29/2016   Admitting Physician Norval Morton, MD  Outpatient Primary MD for the patient is Unk Pinto, MD  LOS - 2  Outpatient Specialists: Dr Gwenlyn Found  Chief Complaint  Patient presents with  . Respiratory Distress  . Chest Pain       Brief Narrative   71 year old male with systolic CHF (recent 2-D echo shows worsened EF of 25-30%), chronic back pain spinal  stimulator and fentanyl pump, type 2 diabetes mellitus presented with acute onset of shortness of breath. Denies any orthopnea, PND chest pain, palpitations, abdominal pain, nausea, vomiting, dizziness, bowel or urination. No recent illness or travel. Patient compliant with his medications. He was hospitalized in January 2018 for acute on chronic systolic CHF and discharged on Lasix. He was readmitted 1 week back for orthostasis and dehydration. He also had cardiac cath around that time which showed nonobstructive CAD.  At baseline patient is quite active and able to ambulate good distance.  In the ED he was tachycardic and tachypneic. Blood work showed WBC of 12, elevated blood glucose, mildly elevated BNP and lactic acid of 2.98. CT angiogram of the chest showed mild cardiomegaly and pulmonary edema with small bilateral pleural effusions. Admitted to telemetry.   Subjective:   Reported history of much better, need to ambulate.   Assessment  & Plan :     Acute on chronic respiratory failure with hypoxia (HCC) Secondary to acute on chronic systolic CHF. Recent 2-D echo with EF of 25-30% with diffuse akinesis. -Continue O2 via nasal  cannula. Symptoms slowly improving but respiratory function still not at baseline. -Continue IV Lasix 40 mg every 12 hours. Negative balance is 3.3 L since admission. -Strict I/O and daily weight. Continue aspirin, Coreg and statin. -ACE inhibitor and low-dose Aldactone added. -Recommend outpatient follow-up with advanced heart failure clinic.  Nonobstructive coronary artery disease Continue aspirin, statin and beta blocker.  Type 2 diabetes mellitus, controlled Patient only on metformin. Will place him on bedtime Lantus.  Continue sliding scale coverage. Hemoglobin A1c is 6.9 showing decent glycemic control.  GERD Continue PPI  Elevated lactic acid No signs of infection. Resolved.  Chronic arthritis Continue methotrexate.  Chronic low back pain Has spinal stimulator and fentanyl pump     Code Status : Full code  Family Communication  : No family at bedside.  Disposition Plan  : Ambulate likely home in a.m. Evaluate for oxygen  Barriers For Discharge : Active symptoms  Consults  :  None  Procedures  : None  DVT Prophylaxis  :  Lovenox   Lab Results  Component Value Date   PLT 270 08/30/2016    Antibiotics  :    Anti-infectives    None        Objective:   Vitals:   08/30/16 1917 08/31/16 0024 08/31/16 0414 08/31/16 0835  BP: (!) 95/53 109/63 98/65 (!) 107/58  Pulse: 85 86 84 88  Resp: 18 18 18    Temp: 98.1 F (36.7 C) 97.9 F (36.6 C) 97.7 F (36.5 C)   TempSrc: Oral Oral Oral   SpO2: 100% 99% 98%   Weight:   83.8 kg (184 lb 12.8 oz)   Height:        Wt Readings from Last 3 Encounters:  08/31/16 83.8 kg (184 lb 12.8 oz)  08/17/16 86.2 kg (190 lb)  07/28/16 87.5 kg (193 lb)     Intake/Output Summary (Last 24 hours) at 08/31/16 1126 Last data filed at 08/31/16 1125  Gross per 24 hour  Intake             1080 ml  Output             1650 ml  Net             -570 ml     Physical Exam  [id number] male not in distress HEENT: Moist  mucosa, supple neck Chest: Fine bibasilar crackles CVS: Normal S1 and S2, no murmurs GI: Soft, mild distention, nontender Musculoskeletal: Warm, no edema     Data Review:    CBC  Recent Labs Lab 08/29/16 2110 08/30/16 0514  WBC 12.0* 5.0  HGB 10.9* 11.0*  HCT 34.1* 34.4*  PLT 276 270  MCV 86.1 84.3  MCH 27.5 27.0  MCHC 32.0 32.0  RDW 17.2* 17.2*  LYMPHSABS 1.8 0.6*  MONOABS 0.2 0.0*  EOSABS 0.2 0.0  BASOSABS 0.0 0.0    Chemistries   Recent Labs Lab 08/29/16 2110 08/30/16 0514 08/31/16 0402  NA 136 137 138  K 3.5 3.9 3.9  CL 101 99* 100*  CO2 24 25 28   GLUCOSE 244* 228* 158*  BUN 14 13 22*  CREATININE 0.66 0.79 0.91  CALCIUM 8.9 9.1 9.2  AST 25  --   --   ALT 18  --   --   ALKPHOS 98  --   --   BILITOT 0.9  --   --    ------------------------------------------------------------------------------------------------------------------ No results for input(s): CHOL, HDL, LDLCALC, TRIG, CHOLHDL, LDLDIRECT in the last 72 hours.  Lab Results  Component Value Date   HGBA1C 6.9 (H) 08/30/2016   ------------------------------------------------------------------------------------------------------------------ No results for input(s): TSH, T4TOTAL, T3FREE, THYROIDAB in the last 72 hours.  Invalid input(s): FREET3 ------------------------------------------------------------------------------------------------------------------ No results for input(s): VITAMINB12, FOLATE, FERRITIN, TIBC, IRON, RETICCTPCT in the last 72 hours.  Coagulation profile  Recent Labs Lab 08/29/16 2110  INR 1.04    No results for input(s): DDIMER in the last 72 hours.  Cardiac Enzymes  Recent Labs Lab 08/29/16 2350 08/30/16 0514 08/30/16 1142  TROPONINI 0.05* <0.03 <0.03   ------------------------------------------------------------------------------------------------------------------    Component Value Date/Time   BNP 209.7 (H) 08/29/2016 2110    Inpatient  Medications  Scheduled Meds: . aspirin EC  81 mg Oral QHS  . atorvastatin  80 mg Oral QHS  . carvedilol  6.25 mg Oral BID WC  . DULoxetine  60 mg Oral Daily  . enoxaparin (LOVENOX) injection  40 mg Subcutaneous Q24H  . famotidine  40 mg Oral QHS  . folic acid  1 mg Oral Daily  . furosemide  40 mg Intravenous BID  . insulin aspart  0-5 Units Subcutaneous QHS  . insulin aspart  0-9 Units Subcutaneous TID WC  . insulin glargine  10 Units Subcutaneous QHS  . lisinopril  5 mg Oral Daily  . [START ON 09/02/2016] methotrexate  15 mg Oral Q Fri  . metoCLOPramide  10 mg Oral BID AC  . pantoprazole  40 mg Oral BID AC  . pregabalin  150 mg Oral BID  . sodium chloride flush  3 mL Intravenous Q12H  . spironolactone  12.5 mg Oral Daily  . tamsulosin  0.4 mg Oral QPC supper   Continuous Infusions: . sodium chloride     PRN Meds:.sodium chloride, acetaminophen, cyclobenzaprine, lidocaine, ondansetron (ZOFRAN) IV, sodium chloride flush  Micro Results No results found for this or any previous visit (from the past 240 hour(s)).  Radiology Reports Ct Angio Chest Pe W Or Wo Contrast  Result Date: 08/29/2016 CLINICAL DATA:  Midsternal chest pain, shortness of breath this evening. Hypoxic. History of diabetes, COPD, CHF. EXAM: CT ANGIOGRAPHY CHEST WITH CONTRAST TECHNIQUE: Multidetector CT imaging of the chest was performed using the standard protocol during bolus administration of intravenous contrast. Multiplanar CT image reconstructions and MIPs were obtained to evaluate the vascular anatomy. CONTRAST:  100 cc Isovue 370 COMPARISON:  Chest radiograph Aug 29, 2016 at 2102 hours and CT chest April 22, 2016 FINDINGS: CARDIOVASCULAR: Adequate contrast opacification of the pulmonary artery's. Main pulmonary artery is not enlarged. No pulmonary arterial filling defects to the level of the subsegmental branches. The heart is mildly enlarged. Mild coronary artery calcifications. No pericardial effusion. No  pericardial effusion. Thoracic aorta is normal course and caliber, mild calcific atherosclerosis. MEDIASTINUM/NODES: Greater than expected number of non pathologically enlarged mediastinal lymph nodes are likely reactive and, unchanged. LUNGS/PLEURA: Tracheobronchial tree is patent, no pneumothorax. Bronchial wall thickening associated with edema. Increased small bilateral pleural effusions. Interlobular septal thickening and heterogeneous lung attenuation. Patchy bibasilar airspace opacities have increased. 4 mm RIGHT upper lobe sub solid pulmonary nodule, 3 mm subpleural RIGHT upper lobe pulmonary nodule. Previous LEFT upper lobe pulmonary nodule has resolved. UPPER ABDOMEN: Included view of the abdomen is unremarkable. MUSCULOSKELETAL: Visualized soft tissues and included osseous structures are nonacute. S-type scoliosis. Thoracic spinal cord stimulator lead within mid dorsal canal. Subacute to old RIGHT eighth through tenth rib fractures. Review of the MIP images confirms the above findings. IMPRESSION: No acute pulmonary embolism. Stable mild cardiomegaly. Worsening pulmonary edema. Increased small pleural effusions and consolidation in the lung bases favoring confluent edema. Resolution of LEFT upper lobe pulmonary nodule, stable sub solid RIGHT pulmonary nodules measuring to 4 mm. No follow-up needed if patient is low-risk (and has no known or suspected primary neoplasm). Non-contrast chest CT can be considered in 12 months if patient is high-risk. This recommendation follows the consensus statement: Guidelines for Management of Incidental Pulmonary Nodules Detected on CT Images:  From the Fleischner Society 2017; Radiology 2017; [phone]. Electronically Signed   By: Elon Alas M.D.   On: 08/29/2016 22:52   Dg Chest Portable 1 View  Result Date: 08/29/2016 CLINICAL DATA:  Shortness of Breath EXAM: PORTABLE CHEST 1 VIEW COMPARISON:  04/26/2016 FINDINGS: Cardiac shadow is mildly enlarged. Vascular  congestion is noted with small pleural effusions. No focal confluent infiltrate is seen. Mild interstitial edema is noted. Spinal stimulators are again noted. Previously seen vague nodular density over the right lung base is obscured by the parenchymal changes. IMPRESSION: Mild changes of CHF. Electronically Signed   By: Inez Catalina M.D.   On: 08/29/2016 21:17    Time Spent in minutes  35   Derrisha Foos A M.D on 08/31/2016 at 11:26 AM  Between 7am to 7pm - Pager - [phone]  After 7pm go to [email or web address] - password [facility]  [facility] -  Office  [phone]

## 2016-08-31 NOTE — Progress Notes (Signed)
SATURATION QUALIFICATIONS: (This note is used to comply with regulatory documentation for home oxygen)  Patient Saturations on Room Air at Rest = 95%  Patient Saturations on Room Air while Ambulating = 78%  Patient Saturations on 2Liters of oxygen while Ambulating = 97%  Please briefly explain why patient needs home oxygen:

## 2016-08-31 NOTE — Progress Notes (Addendum)
Heart Failure Navigator Consult Note  Presentation: James Parsons is a 71 y.o. male with medical history significant of systolic CHF last EF 16-10% on 5/18, COPD, anemia, chronic back pain s/p spinal simulator and fentanyl pump, and DM type II; who presents with complaints of shortness of breath. Patient notes intermittent feeling short of breath throughout the day, but could rest with improvement of symptoms. However, around 5-6 pm while mowing lawn symptoms acutely worsened where he was unable to catch his breath. During this time he had acute onset of chest discomfort. Other associated symptoms include note of progressive worsening abdominal distention. Patient denies having any significant leg swelling, palpitations, loss of consciousness, nausea, vomiting, or diarrhea. Last hospitalized in 04/2016 for CHF exacerbation with EF noted to be 40-45% with grade 1 diastolic dysfunction. At that time he had been sent home on 40 mg of Lasix. He was readmitted for dehydration 1 week later and at that time discharged home on 20 mg of Lasix which he's been on ever since.  Past Medical History:  Diagnosis Date  . Allergy   . Anemia   . Arthritis   . Borderline hypertension   . CHF (congestive heart failure) ([place])   . Clotting disorder (HCC)    bleeds easily-no dx  . COPD (chronic obstructive pulmonary disease) ([place])    denies  . Depression    hx of   . Diabetes mellitus   . Diverticulosis of colon   . Full dentures   . GERD (gastroesophageal reflux disease)   . Hx of colonic polyps   . Hyperlipidemia   . Hypertension   . Lipoma   . Low back pain   . Mental disorder   . Obesity   . Pneumonia    hx  . RSD (reflex sympathetic dystrophy)   . Venous insufficiency     Social History   Social History  . Marital status: Married    Spouse name: karen  . Number of children: 2  . Years of education: N/A   Occupational History  . retired Management consultant    Social History Main  Topics  . Smoking status: Former Smoker    Packs/day: 3.00    Years: 40.00    Types: Cigarettes    Quit date: 05/25/2002  . Smokeless tobacco: Never Used  . Alcohol use No  . Drug use: No  . Sexual activity: No   Other Topics Concern  . None   Social History Narrative  . None    ECHO:Study Conclusions--08/26/16  - Left ventricle: The cavity size was mildly dilated. There was   mild concentric hypertrophy. Systolic function was severely   reduced. The estimated ejection fraction was in the range of 25%   to 30%. There is akinesis of the basal-midanteroseptal and   inferoseptal myocardium. There is akinesis of the entireinferior   myocardium. There is akinesis of the apical myocardium. There was   fusion of early and atrial contributions to ventricular filling.   The study is not technically sufficient to allow evaluation of LV   diastolic function. - Aortic valve: Trileaflet; mildly thickened, mildly calcified   leaflets. - Mitral valve: There was mild regurgitation. - Left atrium: The atrium was mildly dilated. - Atrial septum: There was increased thickness of the septum,   consistent with lipomatous hypertrophy.  ------------------------------------------------------------------- Study data:   Study status:  Routine.  Procedure:  The patient reported no pain pre or post test. Transthoracic echocardiography for left ventricular function  evaluation, for right ventricular function evaluation, and for assessment of valvular function. Image quality was adequate.  Study completion:  There were no complications.          Transthoracic echocardiography.  M-mode, complete 2D, spectral Doppler, and color Doppler.  Birthdate: Patient birthdate: 1945/06/07.  Age:  Patient is 71 yr old.  Sex: Gender: male.    BMI: 28 kg/m^2.  Blood pressure:     118/70 Patient status:  Outpatient.  Study date:  Study date: 08/26/2016. Study time: 01:09 PM.  Location:   Site 3  BNP     Component Value Date/Time   BNP 209.7 (H) 08/29/2016 2110    ProBNP No results found for: PROBNP   Education Assessment and Provision:  Detailed education and instructions provided on heart failure disease management including the following:  Signs and symptoms of Heart Failure When to call the physician Importance of daily weights Low sodium diet Fluid restriction Medication management Anticipated future follow-up appointments  Patient education given on each of the above topics.  Patient acknowledges understanding and acceptance of all instructions.  I spoke with patient regarding his current hospitalization and HF.  He says that this hospitalization he thinks is due to "respiratory" problems.  I discussed HF and how it can relate to his breathing issues.  He tells me that he weighs each day at home and has actually been losing weight secondary to dieting.  I reviewed the importance of daily weights and when to contact the physician related to worsening signs and symptoms.  I also reviewed a low sodium diet.[name]  He is adamant that he does not use table salt yet admits that he and his wife eat out quite a bit.  I emphasized the importance of eating a low sodium diet and sources of sodium/ high sodium foods. He denies any issues getting or taking prescribed medications.  He and his wife just purchased a home at [facility] and plan to split their time between [place] and there.  He typically sees Dr Gwenlyn Found yet is open to coming to the [facility] for follow-up.  His appt is May 31st at 0930 --map and directions were given.   Education Materials:  "Living Better With Heart Failure" Booklet Daily Weight Tracker Tool    High Risk Criteria for Readmission and/or Poor Patient Outcomes:  (Recommend Follow-up with [facility])--yes    EF <30%-yes-25-50%  2 or more admissions in 6 months-3/24mo  Difficult social situation-denies  Demonstrates medication noncompliance-  denies    Barriers of Care:  Knowledge and compliance  Discharge Planning:   Plans to return to return to home to [place]--then to [facility] for a few days and back.

## 2016-09-01 ENCOUNTER — Telehealth: Payer: Self-pay | Admitting: *Deleted

## 2016-09-01 ENCOUNTER — Telehealth: Payer: Self-pay | Admitting: Cardiovascular Disease

## 2016-09-01 DIAGNOSIS — I10 Essential (primary) hypertension: Secondary | ICD-10-CM

## 2016-09-01 DIAGNOSIS — R0902 Hypoxemia: Secondary | ICD-10-CM

## 2016-09-01 LAB — BASIC METABOLIC PANEL
Anion gap: 11 (ref 5–15)
BUN: 26 mg/dL — AB (ref 6–20)
CALCIUM: 9 mg/dL (ref 8.9–10.3)
CO2: 27 mmol/L (ref 22–32)
CREATININE: 0.82 mg/dL (ref 0.61–1.24)
Chloride: 99 mmol/L — ABNORMAL LOW (ref 101–111)
GFR calc Af Amer: >60 mL/min (ref >60)
Glucose, Bld: 136 mg/dL — ABNORMAL HIGH (ref 65–99)
Potassium: 3.8 mmol/L (ref 3.5–5.1)
Sodium: 137 mmol/L (ref 135–145)

## 2016-09-01 LAB — GLUCOSE, CAPILLARY
GLUCOSE-CAPILLARY: 154 mg/dL — AB (ref 65–99)
Glucose-Capillary: 130 mg/dL — ABNORMAL HIGH (ref 65–99)

## 2016-09-01 MED ORDER — FUROSEMIDE 40 MG PO TABS: 40.0000 mg | ORAL_TABLET | Freq: Every day | ORAL | 0 refills | Status: DC

## 2016-09-01 MED ORDER — LISINOPRIL 5 MG PO TABS: 5.0000 mg | ORAL_TABLET | Freq: Every day | ORAL | 0 refills | Status: DC

## 2016-09-01 NOTE — Care Management (Signed)
Per written order by discharge physician for home oxygen ; Alternative methods failed and patient requires home oxygen  Elenor Quinones, RN, BSN [phone]

## 2016-09-01 NOTE — Telephone Encounter (Signed)
Called the patient and left a VM to call me back to make a medication appointment with Erasmo Downer.

## 2016-09-01 NOTE — Care Management Note (Addendum)
Case Management Note  Patient Details  Name: James Parsons MRN: [id number] Date of Birth: 12/13/1945  Subjective/Objective:                 Spoke with patient at the bedside. He states he lives at home with his wife, has a RW and a cane. Describes himself as independent, drives, denies problems obtaining or paying for mediations. PCP [facility], [facility]   Action/Plan:   Expected Discharge Date:  09/01/16               Expected Discharge Plan:  Home/Self Care  In-House Referral:     Discharge planning Services  CM Consult  Post Acute Care Choice:    Choice offered to:     DME Arranged:    DME Agency:     HH Arranged:    HH Agency:     Status of Service:  In process, will continue to follow  If discussed at Long Length of Stay Meetings, dates discussed:    Additional Comments: 09/01/2016  CM contacted by [facility] from bedside - bedside nurse informed both [facility] and CM that pt no longer needs supplemental oxygen based on todays ambulation test - bedside nurse contacted MD and informed of todays results- per bedside nurse MD will cancel home oxygen order.   Pt to discharge home today with wife. Pt qualifies for home oxygen - CM requested home oxygen order.  Pt given choice and chose [facility].  Agency contacted and accepted referral - informed of discharge today Maryclare Labrador, RN 09/01/2016, 9:37 AM

## 2016-09-01 NOTE — Discharge Summary (Addendum)
Physician Discharge Summary  James Parsons VWU:[id number] DOB: 06-01-1945 DOA: 08/29/2016  PCP: Unk Pinto, MD  Admit date: 08/29/2016 Discharge date: 09/01/2016  Admitted From: Home Disposition: Home  Recommendations for Outpatient Follow-up:  1. Follow up with PCP in 1-2 weeks 2. Please obtain BMP/CBC in one week 3. Lasix increased to 40 mg, lisinopril 5 mg daily added.  Home Health: NA Equipment/Devices: Oxygen  Discharge Condition: Stable CODE STATUS: Full Code Diet recommendation: Diet heart healthy/carb modified Room service appropriate? Yes; Fluid consistency: Thin Diet - low sodium heart healthy  Brief/Interim Summary: 71 year old male with systolic CHF (recent 2-D echo shows worsened EF of 25-30%), chronic back pain spinal  stimulator and fentanyl pump, type 2 diabetes mellitus presented with acute onset of shortness of breath. Denies any orthopnea, PND chest pain, palpitations, abdominal pain, nausea, vomiting, dizziness, bowel or urination. No recent illness or travel. Patient compliant with his medications. He was hospitalized in January 2018 for acute on chronic systolic CHF and discharged on Lasix. He was readmitted 1 week back for orthostasis and dehydration. He also had cardiac cath around that time which showed nonobstructive CAD.  At baseline patient is quite active and able to ambulate good distance.  Discharge Diagnoses:  Principal Problem:   Acute on chronic respiratory failure with hypoxia (HCC) Active Problems:   Essential hypertension   T2_NIDDM   Anemia   Acute on chronic combined systolic and diastolic CHF (congestive heart failure) (HCC)   CAD in native artery   Lactic acidosis    Acute on chronic respiratory failure with hypoxia (HCC) Secondary to acute on chronic systolic CHF. Recent 2-D echo with EF of 25-30% with diffuse akinesis. -Continue O2 via nasal cannula. Symptoms improved, on room air but desaturates with ambulation. -Treated  with IV Lasix, Lasix dose increased on discharge. -Lisinopril added at 5 mg, consider Aldactone as outpatient. -Had recent cardiac cath on 04/28/2016 done by Dr. [place] showed nonobstructive multiple vessel involvement. -Patient to follow-up with Dr. Gwenlyn Found as outpatient, please note last time he was discharged on 40 mg Lasix he developed dehydration and orthostatic hypotension back in 04/29/2016.  -Initially he was thought to need oxygen with ambulation because of desaturation but on the day of discharge he walked around 400 feet without desaturation. No oxygen on discharge.  Nonobstructive coronary artery disease Continue aspirin, statin and beta blocker.  Type 2 diabetes mellitus, controlled Patient is on metformin and Amaryl at home, hemoglobin A1c is 6.5 showing decent glycemic control. Restarted home medications.  GERD Continue PPI  Elevated lactic acid No signs of infection. Resolved.  Chronic arthritis Continue methotrexate.  Chronic low back pain Has spinal stimulator and fentanyl pump    Discharge Instructions  Discharge Instructions    Diet - low sodium heart healthy    Complete by:  As directed    Increase activity slowly    Complete by:  As directed      Allergies as of 09/01/2016      Reactions   Codeine Itching   Morphine And Related Other (See Comments)   hallucinations      Medication List    TAKE these medications   albuterol 108 (90 Base) MCG/ACT inhaler Commonly known as:  VENTOLIN HFA Inhale 2 puffs into the lungs every 4 (four) hours as needed for wheezing or shortness of breath.   aspirin 81 MG EC tablet Take 1 tablet (81 mg total) by mouth daily. What changed:  when to take this   atorvastatin 80  MG tablet Commonly known as:  LIPITOR Take 1/2 to 1 tablet daily for cholesterol as directed What changed:  how much to take  how to take this  when to take this  additional instructions   carvedilol 6.25 MG tablet Commonly  known as:  COREG TAKE 1 TABLET BY MOUTH TWICE A DAY WITH A MEAL   cyclobenzaprine 5 MG tablet Commonly known as:  FLEXERIL Take 1 tablet (5 mg total) by mouth 3 (three) times daily as needed for muscle spasms. Muscle spasm.   DSS 100 MG Caps Take 100 mg by mouth 2 (two) times daily.   DULoxetine 60 MG capsule Commonly known as:  CYMBALTA Take 60 mg by mouth daily.   folic acid 1 MG tablet Commonly known as:  FOLVITE Take 1 mg by mouth daily.   furosemide 40 MG tablet Commonly known as:  LASIX Take 1 tablet (40 mg total) by mouth daily. What changed:  medication strength  how much to take   glimepiride 4 MG tablet Commonly known as:  AMARYL TAKE 1 TABLET TWICE DAILY WITH MEALS. DO NOT TAKE IF YOU DO NOT EAT.   lidocaine 5 % Commonly known as:  LIDODERM Place 1 patch onto the skin daily as needed (pain).   lisinopril 5 MG tablet Commonly known as:  PRINIVIL,ZESTRIL Take 1 tablet (5 mg total) by mouth daily.   metFORMIN 500 MG tablet Commonly known as:  GLUCOPHAGE Take 1,000 mg by mouth 2 (two) times daily. What changed:  Another medication with the same name was removed. Continue taking this medication, and follow the directions you see here.   methotrexate 2.5 MG tablet Commonly known as:  RHEUMATREX Take 15 mg by mouth every Friday. 6 tablets   metoCLOPramide 10 MG tablet Commonly known as:  REGLAN Take 1 tablet (10 mg total) by mouth 3 (three) times daily before meals. What changed:  when to take this   NUCYNTA 100 MG Tabs Generic drug:  Tapentadol HCl Take 100 mg by mouth See admin instructions. Take 1 tablet (100 mg)n by mouth twice daily, may also take 1 tablet during the day as needed for pain   ondansetron 4 MG disintegrating tablet Commonly known as:  ZOFRAN ODT Take 1 tablet (4 mg total) by mouth every 8 (eight) hours as needed for nausea or vomiting.   pantoprazole 40 MG tablet Commonly known as:  PROTONIX Take 1 tablet (40 mg total) by mouth 2  (two) times daily before a meal.   pregabalin 150 MG capsule Commonly known as:  LYRICA Take 150 mg by mouth 2 (two) times daily.   PRESCRIPTION MEDICATION by Intrathecal route continuous. Fentanyl 2mg /ml solution . Daily dose 0.8820 mg Compounded at [place] [phone] Pump is refilled monthly   ranitidine 300 MG tablet Commonly known as:  ZANTAC TAKE 1 TABLET BY MOUTH AT BEDTIME.   sucralfate 1 g tablet Commonly known as:  CARAFATE Take 1 tablet (1 g total) by mouth 4 (four) times daily.   tamsulosin 0.4 MG Caps capsule Commonly known as:  FLOMAX Take 1 capsule (0.4 mg total) by mouth daily after supper.   Vitamin D-3 5000 units Tabs Take 5,000 Units by mouth 2 (two) times daily.      Follow-up Information    Shirley Friar, PA-C. Go on 09/08/2016.   Specialty:  Physician Assistant Why:  at 9:30 am in the [facility].  Please bring all medications to appt.  Gate code  [id number] Contact information: [address] [place]  [address] [phone]          Allergies  Allergen Reactions  . Codeine Itching  . Morphine And Related Other (See Comments)    hallucinations    Consultations:  None  Procedures (Echo, Carotid, EGD, Colonoscopy, ERCP)   Radiological studies: Ct Angio Chest Pe W Or Wo Contrast  Result Date: 08/29/2016 CLINICAL DATA:  Midsternal chest pain, shortness of breath this evening. Hypoxic. History of diabetes, COPD, CHF. EXAM: CT ANGIOGRAPHY CHEST WITH CONTRAST TECHNIQUE: Multidetector CT imaging of the chest was performed using the standard protocol during bolus administration of intravenous contrast. Multiplanar CT image reconstructions and MIPs were obtained to evaluate the vascular anatomy. CONTRAST:  100 cc Isovue 370 COMPARISON:  Chest radiograph Aug 29, 2016 at 2102 hours and CT chest April 22, 2016 FINDINGS: CARDIOVASCULAR: Adequate contrast opacification of the pulmonary artery's. Main  pulmonary artery is not enlarged. No pulmonary arterial filling defects to the level of the subsegmental branches. The heart is mildly enlarged. Mild coronary artery calcifications. No pericardial effusion. No pericardial effusion. Thoracic aorta is normal course and caliber, mild calcific atherosclerosis. MEDIASTINUM/NODES: Greater than expected number of non pathologically enlarged mediastinal lymph nodes are likely reactive and, unchanged. LUNGS/PLEURA: Tracheobronchial tree is patent, no pneumothorax. Bronchial wall thickening associated with edema. Increased small bilateral pleural effusions. Interlobular septal thickening and heterogeneous lung attenuation. Patchy bibasilar airspace opacities have increased. 4 mm RIGHT upper lobe sub solid pulmonary nodule, 3 mm subpleural RIGHT upper lobe pulmonary nodule. Previous LEFT upper lobe pulmonary nodule has resolved. UPPER ABDOMEN: Included view of the abdomen is unremarkable. MUSCULOSKELETAL: Visualized soft tissues and included osseous structures are nonacute. S-type scoliosis. Thoracic spinal cord stimulator lead within mid dorsal canal. Subacute to old RIGHT eighth through tenth rib fractures. Review of the MIP images confirms the above findings. IMPRESSION: No acute pulmonary embolism. Stable mild cardiomegaly. Worsening pulmonary edema. Increased small pleural effusions and consolidation in the lung bases favoring confluent edema. Resolution of LEFT upper lobe pulmonary nodule, stable sub solid RIGHT pulmonary nodules measuring to 4 mm. No follow-up needed if patient is low-risk (and has no known or suspected primary neoplasm). Non-contrast chest CT can be considered in 12 months if patient is high-risk. This recommendation follows the consensus statement: Guidelines for Management of Incidental Pulmonary Nodules Detected on CT Images: From the Fleischner Society 2017; Radiology 2017; 284:228-243. Electronically Signed   By: Elon Alas M.D.   On:  08/29/2016 22:52   Dg Chest Portable 1 View  Result Date: 08/29/2016 CLINICAL DATA:  Shortness of Breath EXAM: PORTABLE CHEST 1 VIEW COMPARISON:  04/26/2016 FINDINGS: Cardiac shadow is mildly enlarged. Vascular congestion is noted with small pleural effusions. No focal confluent infiltrate is seen. Mild interstitial edema is noted. Spinal stimulators are again noted. Previously seen vague nodular density over the right lung base is obscured by the parenchymal changes. IMPRESSION: Mild changes of CHF. Electronically Signed   By: Inez Catalina M.D.   On: 08/29/2016 21:17     Subjective:  Discharge Exam: Vitals:   08/31/16 1500 08/31/16 1937 09/01/16 0030 09/01/16 0540  BP: 114/63 123/74 (!) 126/58 (!) 100/58  Pulse: 79 79 72 74  Resp:  20 20 16   Temp: 98.6 F (37 C) 97.4 F (36.3 C) 97.8 F (36.6 C) 97.8 F (36.6 C)  TempSrc: Oral Oral Oral Oral  SpO2: 100% 99% 94% 94%  Weight:    83 kg (183 lb)  Height:  General: Pt is alert, awake, not in acute distress Cardiovascular: RRR, S1/S2 +, no rubs, no gallops Respiratory: CTA bilaterally, no wheezing, no rhonchi Abdominal: Soft, NT, ND, bowel sounds + Extremities: no edema, no cyanosis   The results of significant diagnostics from this hospitalization (including imaging, microbiology, ancillary and laboratory) are listed below for reference.    Microbiology: No results found for this or any previous visit (from the past 240 hour(s)).   Labs: BNP (last 3 results)  Recent Labs  04/26/16 1644 07/25/16 2223 08/29/16 2110  BNP 226.4* 291.4* 762.2*   Basic Metabolic Panel:  Recent Labs Lab 08/29/16 2110 08/30/16 0514 08/31/16 0402 09/01/16 0402  NA 136 137 138 137  K 3.5 3.9 3.9 3.8  CL 101 99* 100* 99*  CO2 24 25 28 27   GLUCOSE 244* 228* 158* 136*  BUN 14 13 22* 26*  CREATININE 0.66 0.79 0.91 0.82  CALCIUM 8.9 9.1 9.2 9.0   Liver Function Tests:  Recent Labs Lab 08/29/16 2110  AST 25  ALT 18  ALKPHOS 98   BILITOT 0.9  PROT 6.8  ALBUMIN 3.8   No results for input(s): LIPASE, AMYLASE in the last 168 hours. No results for input(s): AMMONIA in the last 168 hours. CBC:  Recent Labs Lab 08/29/16 2110 08/30/16 0514  WBC 12.0* 5.0  NEUTROABS 9.8* 4.4  HGB 10.9* 11.0*  HCT 34.1* 34.4*  MCV 86.1 84.3  PLT 276 270   Cardiac Enzymes:  Recent Labs Lab 08/29/16 2350 08/30/16 0514 08/30/16 1142  TROPONINI 0.05* <0.03 <0.03   BNP: Invalid input(s): POCBNP CBG:  Recent Labs Lab 08/31/16 0716 08/31/16 1130 08/31/16 1626 08/31/16 2131 09/01/16 0731  GLUCAP 150* 162* 142* 202* 130*   D-Dimer No results for input(s): DDIMER in the last 72 hours. Hgb A1c  Recent Labs  08/30/16 1142  HGBA1C 6.9*   Lipid Profile No results for input(s): CHOL, HDL, LDLCALC, TRIG, CHOLHDL, LDLDIRECT in the last 72 hours. Thyroid function studies No results for input(s): TSH, T4TOTAL, T3FREE, THYROIDAB in the last 72 hours.  Invalid input(s): FREET3 Anemia work up No results for input(s): VITAMINB12, FOLATE, FERRITIN, TIBC, IRON, RETICCTPCT in the last 72 hours. Urinalysis    Component Value Date/Time   COLORURINE YELLOW 04/29/2016 1634   APPEARANCEUR CLEAR 04/29/2016 1634   LABSPEC 1.014 04/29/2016 1634   PHURINE 6.0 04/29/2016 [place] 04/29/2016 1634   HGBUR NEGATIVE 04/29/2016 [place] 04/29/2016 [place] 04/29/2016 1634   PROTEINUR NEGATIVE 04/29/2016 1634   UROBILINOGEN 0.2 04/12/2014 1555   NITRITE NEGATIVE 04/29/2016 1634   LEUKOCYTESUR NEGATIVE 04/29/2016 1634   Sepsis Labs Invalid input(s): PROCALCITONIN,  WBC,  LACTICIDVEN Microbiology No results found for this or any previous visit (from the past 240 hour(s)).   Time coordinating discharge: Over 30 minutes  SIGNED:   Birdie Hopes, MD  [facility] 09/01/2016, 9:28 AM Pager   If 7PM-7AM, please contact night-coverage [email or web address] Password [email or web address]

## 2016-09-01 NOTE — Progress Notes (Signed)
Patient ambulated 459ft without any complaints of shortness of breath, oxygen sat stayed above 93% room air.  Patient does not qualify for home oxygen.  Attending will discontinue home oxygen order. Marcille Blanco, RN

## 2016-09-01 NOTE — Telephone Encounter (Signed)
error 

## 2016-09-06 ENCOUNTER — Telehealth: Payer: Self-pay | Admitting: *Deleted

## 2016-09-06 ENCOUNTER — Encounter: Payer: Medicare Other | Admitting: Internal Medicine

## 2016-09-06 NOTE — Telephone Encounter (Signed)
SCHEDULED PATIENT A  HOSP FU APPOINTMENT FOR 09/08/16 -

## 2016-09-07 ENCOUNTER — Ambulatory Visit (INDEPENDENT_AMBULATORY_CARE_PROVIDER_SITE_OTHER): Payer: Medicare Other | Admitting: Pharmacist

## 2016-09-07 VITALS — BP 100/58 | HR 60

## 2016-09-07 DIAGNOSIS — I428 Other cardiomyopathies: Secondary | ICD-10-CM | POA: Diagnosis not present

## 2016-09-07 NOTE — Patient Instructions (Addendum)
Return for a  follow up appointment in as needed  Your blood pressure today is 100/58 pulse 60  (HOLD lisinopril dose if BP less than 110/60 before taking medication)  Check your blood pressure at home daily (if able) and keep record of the readings.  Take your BP meds as follows: **ALL medication as prescribed**  Bring all of your meds, your BP cuff and your record of home blood pressures to your next appointment.  Exercise as you're able, try to walk approximately 30 minutes per day.  Keep salt intake to a minimum, especially watch canned and prepared boxed foods.  Eat more fresh fruits and vegetables and fewer canned items.  Avoid eating in fast food restaurants.    HOW TO TAKE YOUR BLOOD PRESSURE: . Rest 5 minutes before taking your blood pressure. .  Don't smoke or drink caffeinated beverages for at least 30 minutes before. . Take your blood pressure before (not after) you eat. . Sit comfortably with your back supported and both feet on the floor (don't cross your legs). . Elevate your arm to heart level on a table or a desk. . Use the proper sized cuff. It should fit smoothly and snugly around your bare upper arm. There should be enough room to slip a fingertip under the cuff. The bottom edge of the cuff should be 1 inch above the crease of the elbow. . Ideally, take 3 measurements at one sitting and record the average.

## 2016-09-07 NOTE — Progress Notes (Signed)
Patient ID: James Parsons                 DOB: 30-Nov-1945                      MRN: [id number]     HPI: James Parsons is a 71 y.o. male referred by Dr. Gwenlyn Found to pharmacist clinic for beta-blocker titration. 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  Will titrate carvedilol as tolerated to a desired 25mg  twice daily per Dr Kennon Holter recommendation.  Also noted appointment with HF clinic scheduled for tomorrow 09/08/16.  Patient denies fatigue, dizziness, swelling, chest pain or shortness of breath. "Feeling better" since discharge hospital discharge on 09/01/16, and reports compliance to all his medication.   Current HTN meds:  Lisinopril 5mg  daily Carvedilol 6.25mg  twice daily Furosemide 40mg  daily  BP goal: <130/80  Social History: former smoker, denies alcohol use, denies other drug use as well  Home BP readings:  No records; 100s/60s per patient recollection  Wt Readings from Last 3 Encounters:  09/01/16 183 lb (83 kg)  08/17/16 190 lb (86.2 kg)  07/28/16 193 lb (87.5 kg)   BP Readings from Last 3 Encounters:  09/07/16 (!) 100/58  09/01/16 (!) 115/57  08/17/16 118/70   Pulse Readings from Last 3 Encounters:  09/07/16 60  09/01/16 71  08/17/16 98    Renal function: Estimated Creatinine Clearance: 83.8 mL/min (by C-G formula based on SCr of 0.82 mg/dL).  Past Medical History:  Diagnosis Date  . Allergy   . Anemia   . Arthritis   . Borderline hypertension   . CHF (congestive heart failure) ([place])   . Clotting disorder (HCC)    bleeds easily-no dx  . COPD (chronic obstructive pulmonary disease) ([place])    denies  . Depression    hx of   . Diabetes mellitus   . Diverticulosis of colon   . Full dentures   . GERD (gastroesophageal reflux disease)   . Hx of colonic polyps   . Hyperlipidemia   . Hypertension   . Lipoma   . Low back pain   . Mental disorder   . Obesity   .  Pneumonia    hx  . RSD (reflex sympathetic dystrophy)   . Venous insufficiency     Current Outpatient Prescriptions on File Prior to Visit  Medication Sig Dispense Refill  . albuterol (VENTOLIN HFA) 108 (90 Base) MCG/ACT inhaler Inhale 2 puffs into the lungs every 4 (four) hours as needed for wheezing or shortness of breath. 1 Inhaler 0  . aspirin EC 81 MG EC tablet Take 1 tablet (81 mg total) by mouth daily. (Patient taking differently: Take 81 mg by mouth at bedtime. ) 30 tablet 0  . atorvastatin (LIPITOR) 80 MG tablet Take 1/2 to 1 tablet daily for cholesterol as directed (Patient taking differently: Take 80 mg by mouth at bedtime. for cholesterol as directed) 90 tablet 1  . carvedilol (COREG) 6.25 MG tablet TAKE 1 TABLET BY MOUTH TWICE A DAY WITH A MEAL 30 tablet 8  . Cholecalciferol (VITAMIN D-3) 5000 UNITS TABS Take 5,000 Units by mouth 2 (two) times daily.    . cyclobenzaprine (FLEXERIL) 5 MG tablet Take 1 tablet (5 mg total) by mouth 3 (three) times daily as needed for muscle spasms. Muscle spasm.    . docusate sodium 100 MG CAPS Take 100 mg by mouth 2 (  two) times daily. (Patient not taking: Reported on 08/29/2016) 10 capsule 0  . DULoxetine (CYMBALTA) 60 MG capsule Take 60 mg by mouth daily.     . folic acid (FOLVITE) 1 MG tablet Take 1 mg by mouth daily.  1  . furosemide (LASIX) 40 MG tablet Take 1 tablet (40 mg total) by mouth daily. 30 tablet 0  . glimepiride (AMARYL) 4 MG tablet TAKE 1 TABLET TWICE DAILY WITH MEALS. DO NOT TAKE IF YOU DO NOT EAT. 180 tablet 1  . lidocaine (LIDODERM) 5 % Place 1 patch onto the skin daily as needed (pain).    [name] lisinopril (PRINIVIL,ZESTRIL) 5 MG tablet Take 1 tablet (5 mg total) by mouth daily. 30 tablet 0  . metFORMIN (GLUCOPHAGE) 500 MG tablet Take 1,000 mg by mouth 2 (two) times daily.  0  . methotrexate (RHEUMATREX) 2.5 MG tablet Take 15 mg by mouth every Friday. 6 tablets  1  . metoCLOPramide (REGLAN) 10 MG tablet Take 1 tablet (10 mg total) by  mouth 3 (three) times daily before meals. (Patient taking differently: Take 10 mg by mouth 2 (two) times daily before a meal. ) 270 tablet 1  . ondansetron (ZOFRAN ODT) 4 MG disintegrating tablet Take 1 tablet (4 mg total) by mouth every 8 (eight) hours as needed for nausea or vomiting. (Patient not taking: Reported on 08/29/2016) 20 tablet 0  . pantoprazole (PROTONIX) 40 MG tablet Take 1 tablet (40 mg total) by mouth 2 (two) times daily before a meal. 180 tablet 1  . pregabalin (LYRICA) 150 MG capsule Take 150 mg by mouth 2 (two) times daily.    [name] PRESCRIPTION MEDICATION by Intrathecal route continuous. Fentanyl 2mg /ml solution . Daily dose 0.8820 mg Compounded at [facility] [phone] Pump is refilled monthly    . ranitidine (ZANTAC) 300 MG tablet TAKE 1 TABLET BY MOUTH AT BEDTIME. 90 tablet 0  . sucralfate (CARAFATE) 1 g tablet Take 1 tablet (1 g total) by mouth 4 (four) times daily. (Patient not taking: Reported on 08/29/2016) 360 tablet 3  . tamsulosin (FLOMAX) 0.4 MG CAPS capsule Take 1 capsule (0.4 mg total) by mouth daily after supper. 90 capsule 0  . Tapentadol HCl (NUCYNTA) 100 MG TABS Take 100 mg by mouth See admin instructions. Take 1 tablet (100 mg)n by mouth twice daily, may also take 1 tablet during the day as needed for pain     Current Facility-Administered Medications on File Prior to Visit  Medication Dose Route Frequency Provider Last Rate Last Dose  . 0.9 %  sodium chloride infusion  500 mL Intravenous Continuous Irene Shipper, MD           Allergies  Allergen Reactions  . Codeine Itching  . Morphine And Related Other (See Comments)    hallucinations    Blood pressure (!) 100/58, pulse 60.  Carvedilol titration:  Patient blood pressure today in the low side of normal, but patient remains asymptomatic. Pulse remains appropriate for carvedilol use at 60bpm.   Patient will to establish with HF clinic tomorrow for additional therapy adjustment. Will  continue current pharmacotherapy without changes and follow up in clinic as needed.    Jarrod Mcenery Rodriguez-Guzman PharmD, [place] [address] 09/07/2016 10:01 AM

## 2016-09-08 ENCOUNTER — Other Ambulatory Visit (HOSPITAL_COMMUNITY): Payer: Self-pay | Admitting: *Deleted

## 2016-09-08 ENCOUNTER — Ambulatory Visit (INDEPENDENT_AMBULATORY_CARE_PROVIDER_SITE_OTHER): Payer: Medicare Other | Admitting: Internal Medicine

## 2016-09-08 ENCOUNTER — Ambulatory Visit (HOSPITAL_COMMUNITY)
Admit: 2016-09-08 | Discharge: 2016-09-08 | Disposition: A | Discharge status: Home / Self Care | Payer: Medicare Other | Source: Ambulatory Visit | Attending: Cardiology | Admitting: Cardiology

## 2016-09-08 VITALS — BP 116/64 | HR 88 | Temp 97.7°F | Resp 16 | Ht 69.0 in | Wt 188.0 lb

## 2016-09-08 VITALS — BP 110/70 | HR 101 | Wt 188.6 lb

## 2016-09-08 DIAGNOSIS — Z7982 Long term (current) use of aspirin: Secondary | ICD-10-CM | POA: Insufficient documentation

## 2016-09-08 DIAGNOSIS — I447 Left bundle-branch block, unspecified: Secondary | ICD-10-CM

## 2016-09-08 DIAGNOSIS — I5022 Chronic systolic (congestive) heart failure: Secondary | ICD-10-CM

## 2016-09-08 DIAGNOSIS — E1121 Type 2 diabetes mellitus with diabetic nephropathy: Secondary | ICD-10-CM | POA: Diagnosis not present

## 2016-09-08 DIAGNOSIS — I10 Essential (primary) hypertension: Secondary | ICD-10-CM

## 2016-09-08 DIAGNOSIS — E119 Type 2 diabetes mellitus without complications: Secondary | ICD-10-CM | POA: Diagnosis not present

## 2016-09-08 DIAGNOSIS — I110 Hypertensive heart disease with heart failure: Secondary | ICD-10-CM | POA: Insufficient documentation

## 2016-09-08 DIAGNOSIS — G8929 Other chronic pain: Secondary | ICD-10-CM | POA: Diagnosis not present

## 2016-09-08 DIAGNOSIS — F329 Major depressive disorder, single episode, unspecified: Secondary | ICD-10-CM | POA: Insufficient documentation

## 2016-09-08 DIAGNOSIS — I428 Other cardiomyopathies: Secondary | ICD-10-CM

## 2016-09-08 DIAGNOSIS — Z885 Allergy status to narcotic agent status: Secondary | ICD-10-CM | POA: Diagnosis not present

## 2016-09-08 DIAGNOSIS — E782 Mixed hyperlipidemia: Secondary | ICD-10-CM | POA: Diagnosis not present

## 2016-09-08 DIAGNOSIS — I872 Venous insufficiency (chronic) (peripheral): Secondary | ICD-10-CM | POA: Insufficient documentation

## 2016-09-08 DIAGNOSIS — I5042 Chronic combined systolic (congestive) and diastolic (congestive) heart failure: Secondary | ICD-10-CM | POA: Diagnosis not present

## 2016-09-08 DIAGNOSIS — M1990 Unspecified osteoarthritis, unspecified site: Secondary | ICD-10-CM | POA: Diagnosis not present

## 2016-09-08 DIAGNOSIS — I2510 Atherosclerotic heart disease of native coronary artery without angina pectoris: Secondary | ICD-10-CM

## 2016-09-08 DIAGNOSIS — Z79899 Other long term (current) drug therapy: Secondary | ICD-10-CM | POA: Diagnosis not present

## 2016-09-08 DIAGNOSIS — Z7984 Long term (current) use of oral hypoglycemic drugs: Secondary | ICD-10-CM | POA: Diagnosis not present

## 2016-09-08 DIAGNOSIS — Z87891 Personal history of nicotine dependence: Secondary | ICD-10-CM | POA: Diagnosis not present

## 2016-09-08 DIAGNOSIS — M549 Dorsalgia, unspecified: Secondary | ICD-10-CM | POA: Insufficient documentation

## 2016-09-08 MED ORDER — SPIRONOLACTONE 25 MG PO TABS: 12.5000 mg | ORAL_TABLET | Freq: Every day | ORAL | 3 refills | Status: DC

## 2016-09-08 NOTE — Patient Instructions (Signed)
START taking Spironolactone 12.5 mg (0.5 Tablet) Once Daily at bedtime.   Labs in 10 Days (BMET)  Follow up in 1 Month.

## 2016-09-08 NOTE — Progress Notes (Signed)
Advanced Heart Failure Clinic Note   Primary Care: Dr. [name] Primary Cardiologist: Dr. Gwenlyn Found HF: New  HPI:  James Parsons is a 71 y.o. male with PMH of Chronic systolic CHF with EF 15-17%, non-critical CAD, HTN, mixed HLD, DM2, and chronic back pain with spinal stimulator and fentanyl pump.    Admitted 5/21 -> 09/01/16 with acute SOB. Diuresed with IV lasix and discharged on po lasix 40 mg daily (Increase from his chronic dose).  He initially desaturated with ambulation on RA, but ultimately did not require 02 with distances of up to 400 feet. Discharge weight 183 lbs  [name] presents today to establish in the HF clinic.  Feeling great since he's been out of the hospital.  Takes his dog walking 1/4 - 1/2 mile without SOB. Denies SOB changing clothes or bathing. ? Mild Orthopnea. Sleeps on 2 pillows chronically. Denies bendopnea. Denies lightheadedness or dizziness. Did have to hold his lisinopril yesterday for BP 100/60 without symptoms. Former smoker as below.  No ETOH.  Is getting his spinal stimulator taken out in 2 weeks. Denies exertional CP.  Had mild chest pressure prior to previous admission, but none since.    R/LHC 04/25/2016  There is moderate to severe left ventricular systolic dysfunction.  LV end diastolic pressure is normal.  The left ventricular ejection fraction is 25-35% by visual estimate.  Mid RCA lesion, 40 %stenosed.  Ost Ramus to Ramus lesion, 60 %stenosed.  Ost LAD to Prox LAD lesion, 30 %stenosed.  Prox LAD to Mid LAD lesion, 20 %stenosed.  Hemodynamic findings consistent with mild pulmonary hypertension.  LV end diastolic pressure is normal. RHC Procedural Findings: Hemodynamics (mmHg) RA mean 7 RV 35/4 (10) PA 33/17 (25) PCWP 16 AO 121/60 Cardiac Output (Fick) 5.35 Cardiac Index (Fick) 2.61   Echo 08/26/2016 with LVEF 25-30%, mild MR, mild LAE  Social: 2 sons, 4 grandchildren. Retired Engineer, structural. Former smoker with > 100 pack years of  tobacco abuse.  Stopped smoking in early 2000s. Has been married for 47 years this year.   Past Medical History 1. Chronic systolic CHF with EF 61-60% - Down from 40-45% 04/2016 2. non-critical CAD - Out of proportion to his HF.   3. HTN - Well controlled.  4. Mixed HLD - On atorvastatin. 5. DM2 - Per PCP 6. Chronic back pain with spinal stimulator and fentanyl pump.   - To have Spinal stimulator removed 09/2016 7. LBBB - QRS 158 EKG 09/08/16. If EF remains depressed, Will likely benefit from CRT-P/D.   Review of systems complete and found to be negative unless listed in HPI.     Past Medical History:  Diagnosis Date  . Allergy   . Anemia   . Arthritis   . Borderline hypertension   . CHF (congestive heart failure) ([place])   . Clotting disorder (HCC)    bleeds easily-no dx  . COPD (chronic obstructive pulmonary disease) ([place])    denies  . Depression    hx of   . Diabetes mellitus   . Diverticulosis of colon   . Full dentures   . GERD (gastroesophageal reflux disease)   . Hx of colonic polyps   . Hyperlipidemia   . Hypertension   . Lipoma   . Low back pain   . Mental disorder   . Obesity   . Pneumonia    hx  . RSD (reflex sympathetic dystrophy)   . Venous insufficiency     Current Outpatient Prescriptions  Medication  Sig Dispense Refill  . albuterol (VENTOLIN HFA) 108 (90 Base) MCG/ACT inhaler Inhale 2 puffs into the lungs every 4 (four) hours as needed for wheezing or shortness of breath. 1 Inhaler 0  . aspirin 81 MG chewable tablet Chew 81 mg by mouth at bedtime.    [name] atorvastatin (LIPITOR) 80 MG tablet Take 80 mg by mouth at bedtime.    . carvedilol (COREG) 6.25 MG tablet TAKE 1 TABLET BY MOUTH TWICE A DAY WITH A MEAL 30 tablet 8  . Cholecalciferol (VITAMIN D-3) 5000 UNITS TABS Take 5,000 Units by mouth 2 (two) times daily.    . cyclobenzaprine (FLEXERIL) 5 MG tablet Take 1 tablet (5 mg total) by mouth 3 (three) times daily as needed for muscle spasms. Muscle  spasm.    . DULoxetine (CYMBALTA) 60 MG capsule Take 60 mg by mouth daily.     . folic acid (FOLVITE) 1 MG tablet Take 1 mg by mouth daily.  1  . furosemide (LASIX) 40 MG tablet Take 1 tablet (40 mg total) by mouth daily. 30 tablet 0  . glimepiride (AMARYL) 4 MG tablet TAKE 1 TABLET TWICE DAILY WITH MEALS. DO NOT TAKE IF YOU DO NOT EAT. 180 tablet 1  . lidocaine (LIDODERM) 5 % Place 1 patch onto the skin daily as needed (pain).    [name] lisinopril (PRINIVIL,ZESTRIL) 5 MG tablet Take 1 tablet (5 mg total) by mouth daily. 30 tablet 0  . metFORMIN (GLUCOPHAGE) 500 MG tablet Take 1,000 mg by mouth 2 (two) times daily.  0  . methotrexate (RHEUMATREX) 2.5 MG tablet Take 15 mg by mouth every Friday. 6 tablets  1  . metoCLOPramide (REGLAN) 10 MG tablet Take 10 mg by mouth 2 (two) times daily before a meal.    . pantoprazole (PROTONIX) 40 MG tablet Take 1 tablet (40 mg total) by mouth 2 (two) times daily before a meal. 180 tablet 1  . pregabalin (LYRICA) 150 MG capsule Take 150 mg by mouth 2 (two) times daily.    [name] PRESCRIPTION MEDICATION by Intrathecal route continuous. Fentanyl 2mg /ml solution . Daily dose 0.8820 mg Compounded at [facility] [phone] Pump is refilled monthly    . ranitidine (ZANTAC) 300 MG tablet TAKE 1 TABLET BY MOUTH AT BEDTIME. 90 tablet 0  . tamsulosin (FLOMAX) 0.4 MG CAPS capsule Take 1 capsule (0.4 mg total) by mouth daily after supper. 90 capsule 0  . Tapentadol HCl (NUCYNTA) 100 MG TABS Take 100 mg by mouth See admin instructions. Take 1 tablet (100 mg)n by mouth twice daily, may also take 1 tablet during the day as needed for pain    . ondansetron (ZOFRAN ODT) 4 MG disintegrating tablet Take 1 tablet (4 mg total) by mouth every 8 (eight) hours as needed for nausea or vomiting. (Patient not taking: Reported on 08/29/2016) 20 tablet 0   Current Facility-Administered Medications  Medication Dose Route Frequency Provider Last Rate Last Dose  . 0.9 %  sodium  chloride infusion  500 mL Intravenous Continuous Irene Shipper, MD        Allergies  Allergen Reactions  . Codeine Itching  . Morphine And Related Other (See Comments)    hallucinations      Social History   Social History  . Marital status: Married    Spouse name: karen  . Number of children: 2  . Years of education: N/A   Occupational History  . retired Management consultant    Social  History Main Topics  . Smoking status: Former Smoker    Packs/day: 3.00    Years: 40.00    Types: Cigarettes    Quit date: 05/25/2002  . Smokeless tobacco: Never Used  . Alcohol use No  . Drug use: No  . Sexual activity: No   Other Topics Concern  . Not on file   Social History Narrative  . No narrative on file      Family History  Problem Relation Age of Onset  . Cancer Father   . Colon cancer Neg Hx   . Esophageal cancer Neg Hx   . Stomach cancer Neg Hx   . Rectal cancer Neg Hx     Vitals:   09/08/16 0937  BP: 110/70  Pulse: (!) 101  SpO2: 93%  Weight: 188 lb 9.6 oz (85.5 kg)   Wt Readings from Last 3 Encounters:  09/08/16 188 lb 9.6 oz (85.5 kg)  09/01/16 183 lb (83 kg)  08/17/16 190 lb (86.2 kg)    PHYSICAL EXAM: General:  Elderly appearing. Ambulated into clinic using walker.  Wife present.  HEENT: normal Neck: supple. no JVD. Carotids 2+ bilat; no bruits. No lymphadenopathy or thyromegaly appreciated. Cor: PMI nondisplaced. Regular rate & rhythm. No rubs, gallops or murmurs. Lungs: Clear Abdomen: soft, nontender, distended. No hepatosplenomegaly. No bruits or masses. Good bowel sounds. Extremities: no cyanosis, clubbing, or rash. No peripheral edema.  Neuro: alert & oriented x 3, cranial nerves grossly intact. moves all 4 extremities w/o difficulty. Affect very pleasant.  ECG: NSR, 84 bpm LBBB - QRS 158 ms  ASSESSMENT & PLAN:  1. Chronic systolic CHF with EF 84-66% - NYHA II-III, mostly limited by chronic back pain.  - Volume status stable on exam.  Continue lasix 40 mg daily. Discussed sliding scale diuretics and cutting back as needed.  - Drop in EF out of proportion to his CAD on cath as above.  ? If due to LBBB. If EF does not improve on GDMT, will need to consider CRT.  - Takes his 5 mg lisinopril in the evening. Will not increase or change to losartan at this time, but consider in the future. With current pressures, doubt he would tolerate Entresto, but will work towards. - Continue coreg 6.25 mg BID - Add spiro 12.5 mg qhs - Reinforced fluid restriction to < 2 L daily, sodium restriction to less than 2000 mg daily, and the importance of daily weights.   2. non-critical CAD - No chest pain.  - Continue ASA and statin.  3. HTN - Meds as above.  4. Mixed HLD - Per Dr. Gwenlyn Found 5. DM2 - Per PCP 6. Chronic back pain with spinal stimulator and fentanyl pump.   7. LBBB - QRS 158 - If EF remains depressed, may benefit from CRT as above.   BMET 7-10 days with addition of spiro.  He has appt with Dr. Gwenlyn Found tomorrow.  He plans to keep, I let him know we would try and schedule our appointments more effectively in the future, and try to alternate with Dr. Gwenlyn Found.   RTC 4 weeks for continued medication titration.   Shirley Friar, PA-C 09/08/16   Greater than 50% of the 25 minute visit was spent in counseling/coordination of care regarding disease state education, medication reconciliation, salt/fluid restriction, sliding scale diuretics, discussion of medical regimen with Pharm-D, and discussion of plan with on-site MD.

## 2016-09-08 NOTE — Progress Notes (Signed)
[place]     This very nice 71 y.o. MWM was admitted 5/21-5/24/2018  and presents for post hospital follow up   For: Acute on chronic respiratory failure with hypoxia, Essential hypertension, T2_NIDDM, Anemia, Acute on chronic combined systolic and diastolic CHF, CAD in native artery and Lactic acidosis.  Patient had previous admission in Jan 2018 with acute on chronic CHF and was d/c'd on Lasix and then readmitted with Postural hypotension & dehydration. 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 Patient presented hypoxic, tachypneic & tachycardic in respiratory distress and CT angio showed Pulm edema w/ small bilat effusions. Patient was treated initially with BiPAP and weaned to 4 L/M.  By discharge , he was weaned off O2. WBC was elevated at 12K and Lactic acid was also elevated at 2.98.  Recent 2D echo showed REF 25-30%.   Troponins were negative & patient was diuresed with IV Lasix and improved.  Patient was seen earlier today at the [facility].  Patient was contacted post discharge by office staff to assure stability and schedule f/u.      Hospitalization discharge instructions and medications are reconciled with the patient.      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      Patient is treated for HTN & BP has been controlled at home. Today's BP: 116/64. Patient has had no complaints of any cardiac type chest pain, palpitations, dyspnea/orthopnea/PND, dizziness, claudication, or dependent edema. Lab Results  Component Value Date   BUN 16 09/08/2016   Lab Results  Component Value Date   CREATININE 0.93 09/08/2016      Hyperlipidemia is controlled with diet & meds. Patient denies myalgias or other med SE's. Last Lipids were at goal: Lab Results  Component Value Date   CHOL 105 06/27/2016   HDL 27 (L) 06/27/2016   LDLCALC 56 06/27/2016   TRIG 110 06/27/2016   CHOLHDL 3.9 06/27/2016      Also, the patient has history of T2_NIDDM and has had no symptoms of reactive hypoglycemia, diabetic polys, paresthesias or visual blurring.  Last A1c was not at goal: Lab Results  Component Value Date   HGBA1C 6.9 (H) in [facility]  08/30/2016      Further, the patient also has history of Vitamin D Deficiency and supplements vitamin D without any suspected side-effects. Last vitamin D was still very low: Lab Results  Component Value Date   VD25OH 36 03/23/2016   Current Outpatient Prescriptions on File Prior to Visit  Medication Sig  . VENTOLIN HFA  inhaler 2 puffs every 4  hrs as needed   . aspirin 81 MG  Chew 81 mg by mouth at bedtime.  [name] atorvastatin  80 MG tablet Take 80 mg by mouth at bedtime.  . carvedilol 6.25 MG tablet TAKE 1 TAB TWICE A DAY WITH A MEAL  . VITAMIN D 5000 UNITS  Take  2 x daily.  . cyclobenzaprine  5 MG tablet Take 1 tab 3 x daily as needed for muscle spasms  . DULoxetine  60 MG capsule Take 60 mg by mouth daily.   . folic acid  1 MG tablet Take 1 mg by mouth daily.  . Furosemide 40 MG tablet Take 1 tablet (40 mg total) by mouth daily.  [name] glimepiride 4 MG tablet TAKE 1 TAB 2 x DAILY WITH MEALS  . LIDODERM  5 % Place 1 patch onto the skin daily as needed (pain).  [name] lisinopril  5 MG tablet Take 1 tablet (5 mg total) by mouth daily.  . metFORMIN 500 MG tablet Take 1,000 mg by mouth 2 (two) times daily.  . methotrexate  2.5 MG tablet Take 15 mg by mouth every Friday. 6 tablets  . metoCLOPramide 10 MG tablet Take 10 mg by mouth 2 (two) times daily before a meal.  . ondansetron ODT 4 MG  Take 1 tab every 8  hrs as needed for nausea   . pantoprazole 40 MG tablet Take 1 tab 2 x daily before a meal.  . pregabalin 150 MG capsule Take 150 mg by mouth 2 (two) times daily.  [name] PRESCRIPTION MEDICATION Intrathecal \ Fentanyl 2mg /ml solution . Daily dose 0.8820 mg / Pump is refilled monthly  . ranitidine  300 MG tablet TAKE 1 TABLET BY MOUTH AT BEDTIME.  [name] spironolactone  25 MG tablet Take 0.5 tablets (12.5 mg total) by mouth at bedtime.  . tamsulosin  0.4 MG CAPS capsule Take 1 capsule (0.4 mg total) by mouth daily after supper.  . Tapentadol (NUCYNTA) 100 MG TABS Take 1 tab  twice daily, may also take 1 tablet during the day as needed for pain   Allergies  Allergen Reactions  . Codeine Itching  . Morphine And Related Other (See Comments)    hallucinations   PMHx:   Past Medical History:  Diagnosis Date  . Allergy   . Anemia   . Arthritis   . Borderline hypertension   . CHF (congestive heart failure) ([place])   . Clotting disorder (HCC)    bleeds easily-no dx  . COPD (chronic obstructive pulmonary disease) ([place])    denies  . Depression    hx of   . Diabetes mellitus   . Diverticulosis of colon   . Full dentures   . GERD (gastroesophageal reflux disease)   . Hx of colonic polyps   . Hyperlipidemia   . Hypertension   . Lipoma   . Low back pain   . Mental disorder   . Obesity   . Pneumonia    hx  . RSD (reflex sympathetic dystrophy)   . Venous insufficiency    Immunization History  Administered Date(s) Administered  . Influenza Split 04/22/2011, 01/20/2012  . Influenza Whole 01/15/2007, 01/21/2009, 03/08/2010  . Influenza, High Dose Seasonal PF 02/07/2013, 12/10/2015  . Influenza-Unspecified 12/10/2013, 01/10/2015  . Pneumococcal Conjugate-13 06/09/2015  . Pneumococcal Polysaccharide-23 04/22/2011  . Tdap 04/11/2001, 05/07/2013   Past Surgical History:  Procedure Laterality Date  . BACK SURGERY  2005,2007  . C3-4 anterior cervical discectomy and fusion with plating at c3-4  05/2006   Dr. Arnoldo Morale  . CARDIAC CATHETERIZATION N/A 04/25/2016   Procedure: Right/Left Heart Cath and Coronary Angiography;  Surgeon: Peter M Martinique, MD;  Location: [facility];  Service: Cardiovascular;  Laterality: N/A;  . COLONOSCOPY    . COLONOSCOPY W/ POLYPECTOMY    . excision of  lipoma from right olecranon area  11/2000   Dr. Rise [name]  . KNEE ARTHROSCOPY     right knee  . KNEE ARTHROSCOPY Right 06/06/2014   Procedure: ARTHROSCOPY RIGHT KNEE WITH REMOVAL OF FIBROUS BANDS;  Surgeon: Kerin Salen, MD;  Location: [facility];  Service: Orthopedics;  Laterality: Right;  . LUMBAR LAMINECTOMY/DECOMPRESSION MICRODISCECTOMY N/A 01/08/2014   Procedure: L4-S1 Decompression with removal and reimplantation of spinal cord stimulator battery ;  Surgeon: Duane Lope  Rolena Infante, MD;  Location: [facility];  Service: Orthopedics;  Laterality: N/A;  . microdiscectomy and decompression  05/2002   Dr. Tonita Cong  . morphine pump  2009   due to reaction to morphine/changed to fentanyl  . PAIN PUMP IMPLANTATION     with fentanyl  . spinal cord stimulator implanted     for pain per Dr. Maryruth Eve  . subcut pain pump implanted    . TONSILLECTOMY    . TOTAL KNEE ARTHROPLASTY  02/29/2012   Procedure: TOTAL KNEE ARTHROPLASTY;  Surgeon: Kerin Salen, MD;  Location: [facility];  Service: Orthopedics;  Laterality: Right;   FHx:    Reviewed / unchanged  SHx:    Reviewed / unchanged  Systems Review:  Constitutional: Denies fever, chills, wt changes, headaches, insomnia, fatigue, night sweats, change in appetite. Eyes: Denies redness, blurred vision, diplopia, discharge, itchy, watery eyes.  ENT: Denies discharge, congestion, post nasal drip, epistaxis, sore throat, earache, hearing loss, dental pain, tinnitus, vertigo, sinus pain, snoring.  CV: Denies chest pain, palpitations, irregular heartbeat, syncope, dyspnea, diaphoresis, orthopnea, PND, claudication or edema. Respiratory: denies cough, dyspnea, DOE, pleurisy, hoarseness, laryngitis, wheezing.  Gastrointestinal: Denies dysphagia, odynophagia, heartburn, reflux, water brash, abdominal pain or cramps, nausea, vomiting, bloating, diarrhea, constipation, hematemesis, melena, hematochezia  or hemorrhoids. Genitourinary: Denies dysuria, frequency, urgency,  nocturia, hesitancy, discharge, hematuria or flank pain. Musculoskeletal: Denies arthralgias, myalgias, stiffness, jt. swelling, pain, limping or strain/sprain.  Skin: Denies pruritus, rash, hives, warts, acne, eczema or change in skin lesion(s). Neuro: No weakness, tremor, incoordination, spasms, paresthesia or pain. Psychiatric: Denies confusion, memory loss or sensory loss. Endo: Denies change in weight, skin or hair change.  Heme/Lymph: No excessive bleeding, bruising or enlarged lymph nodes.  Physical Exam  BP 116/64   Pulse 88   Temp 97.7 F (36.5 C)   Resp 16   Ht 5\' 9"  (1.753 m)   Wt 188 lb (85.3 kg)   BMI 27.76 kg/m   Appears well nourished, well groomed  and in no distress.  Eyes: PERRLA, EOMs, conjunctiva no swelling or erythema. Sinuses: No frontal/maxillary tenderness ENT/Mouth: EAC's clear, TM's nl w/o erythema, bulging. Nares clear w/o erythema, swelling, exudates. Oropharynx clear without erythema or exudates. Oral hygiene is good. Tongue normal, non obstructing. Hearing intact.  Neck: Supple. Thyroid nl. Car 2+/2+ without bruits, nodes or JVD. Chest: Respirations nl with BS clear & equal w/o rales, rhonchi, wheezing or stridor.  Cor: Heart sounds normal w/ regular rate and rhythm without sig. murmurs, gallops, clicks or rubs. Peripheral pulses normal and equal  without edema.  Abdomen: Soft & bowel sounds normal. Non-tender w/o guarding, rebound, hernias, masses or organomegaly.  Lymphatics: Unremarkable.  Musculoskeletal: Full ROM all peripheral extremities, joint stability, 5/5 strength and uses a cane to stabilize his gait..  Skin: Warm, dry without exposed rashes, lesions or ecchymosis apparent.  Neuro: Cranial nerves intact, reflexes equal bilaterally. Sensory-motor testing grossly intact. Tendon reflexes grossly intact.  Pysch: Alert & oriented x 3.  Insight and judgement nl & appropriate. No ideations.  Assessment and Plan:  1. Chronic combined systolic  and diastolic HF (heart failure) ([place])   2. Essential hypertension  - Continue medication, monitor blood pressure at home.  - Continue DASH diet. Reminder to go to the ER if any CP,  SOB, nausea, dizziness, severe HA, changes vision/speech,  left arm numbness and tingling and jaw pain.  3. Controlled type 2 diabetes mellitus with diabetic nephropathy, without long-term current use of insulin ([place])  -  Continue diet/meds, exercise,& lifestyle modifications.   4. Non-ischemic cardiomyopathy ([place])   5. Medication management  - CBC with Differential/Platelet - BASIC METABOLIC PANEL WITH GFR       Discussed  regular exercise, BP monitoring, weight control to achieve/maintain BMI less than 25 and discussed med and SE's. Recommended labs to assess and monitor clinical status with further disposition pending results of labs. Over 30 minutes of exam, counseling, chart review was performed.

## 2016-09-08 NOTE — Progress Notes (Signed)
Advanced Heart Failure Medication Review by a Pharmacist  Does the patient  feel that his/her medications are working for him/her?  yes  Has the patient been experiencing any side effects to the medications prescribed?  no  Does the patient measure his/her own blood pressure or blood glucose at home?  yes   Does the patient have any problems obtaining medications due to transportation or finances?   no  Understanding of regimen: good Understanding of indications: good Potential of compliance: good Patient understands to avoid NSAIDs. Patient understands to avoid decongestants.  Issues to address at subsequent visits: None   Pharmacist comments:  James Parsons is a pleasant 71 yo M presenting with his wife who manages his medications. He reports good compliance with his regimen and did not have any specific medication-related questions or concerns for me at this time.   Ruta Hinds. Velva Harman, PharmD, BCPS, CPP Clinical Pharmacist Pager: [phone] Phone: [phone] 09/08/2016 9:48 AM      Time with patient: 10 minutes Preparation and documentation time: 2 minutes Total time: 12 minutes

## 2016-09-08 NOTE — Patient Instructions (Signed)

## 2016-09-09 ENCOUNTER — Ambulatory Visit (INDEPENDENT_AMBULATORY_CARE_PROVIDER_SITE_OTHER): Payer: Medicare Other | Admitting: Cardiovascular Disease

## 2016-09-09 ENCOUNTER — Encounter: Payer: Self-pay | Admitting: Cardiovascular Disease

## 2016-09-09 DIAGNOSIS — I428 Other cardiomyopathies: Secondary | ICD-10-CM

## 2016-09-09 LAB — CBC WITH DIFFERENTIAL/PLATELET
BASOS ABS: 0 {cells}/uL (ref 0–200)
BASOS PCT: 0 %
EOS ABS: 261 {cells}/uL (ref 15–500)
Eosinophils Relative: 3 %
HEMATOCRIT: 39.2 % (ref 38.5–50.0)
Hemoglobin: 12.4 g/dL — ABNORMAL LOW (ref 13.2–17.1)
LYMPHS PCT: 29 %
Lymphs Abs: 2523 cells/uL (ref 850–3900)
MCH: 27.2 pg (ref 27.0–33.0)
MCHC: 31.6 g/dL — ABNORMAL LOW (ref 32.0–36.0)
MCV: 86 fL (ref 80.0–100.0)
MONO ABS: 435 {cells}/uL (ref 200–950)
MONOS PCT: 5 %
MPV: 9.6 fL (ref 7.5–12.5)
NEUTROS PCT: 63 %
Neutro Abs: 5481 cells/uL (ref 1500–7800)
PLATELETS: 296 10*3/uL (ref 140–400)
RBC: 4.56 MIL/uL (ref 4.20–5.80)
RDW: 17.8 % — AB (ref 11.0–15.0)
WBC: 8.7 10*3/uL (ref 3.8–10.8)

## 2016-09-09 LAB — BASIC METABOLIC PANEL WITH GFR
BUN: 16 mg/dL (ref 7–25)
CO2: 24 mmol/L (ref 20–31)
CREATININE: 0.93 mg/dL (ref 0.70–1.18)
Calcium: 9.6 mg/dL (ref 8.6–10.3)
Chloride: 98 mmol/L (ref 98–110)
GFR, Est African American: >89 mL/min (ref >=60)
GFR, Est Non African American: 83 mL/min (ref >=60)
Glucose, Bld: 177 mg/dL — ABNORMAL HIGH (ref 65–99)
POTASSIUM: 3.8 mmol/L (ref 3.5–5.3)
Sodium: 139 mmol/L (ref 135–146)

## 2016-09-09 MED ORDER — CARVEDILOL 6.25 MG PO TABS: 9.3750 mg | ORAL_TABLET | Freq: Two times a day (BID) | ORAL | 6 refills | Status: DC

## 2016-09-09 NOTE — Assessment & Plan Note (Signed)
James Parsons has nonischemic cardiomyopathy. He was recently admitted 08/29/16 for 3 days for decompensation and was diuresed. His EF was in the 25-30% range. He did see Joesph July, PA in the advanced heart failure clinic yesterday who intends to slowly titrate his medications. He appears euvolemic on exam today. He is aware of salt restriction. 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 I'm going to slowly up titrate his carvedilol today. I agree that he would be a candidate for Entresto  therapy should his blood pressure tolerated. He is scheduled to see the heart failure clinic back in one month. I will see him back in 6 months for follow-up.

## 2016-09-09 NOTE — Progress Notes (Signed)
09/09/2016 James Parsons   02-12-1946  [id number]  Primary Physician James Pinto, MD Primary Cardiologist: [name] MD [name]  HPI:    Mr. James Parsons is a 71 year old mildly overweight married Caucasian male father of 2 sons, grandfather and 4 grandchildren who is here in follow-up of nonischemic cardiomyopathy. I last saw him in the office 08/17/16. He is a retired Research officer, trade [employer]. His primary care provider is Dr. Melford Parsons. His cardiac risk factor profile is notable for over 100 pack years of tobacco abuse having quit 15 years ago.  He has a history of treated hypertension, hyperlipidemia and diabetes. He never had a heart attack or stroke. He was hospitalized in early January of this year with chest pain and CHF. Cardiac catheterization performed by Dr. Martinique revealed noncritical CAD with an EF of 35%. By echo his EF was 40-45%. He is on appropriate medications. He was subsequently admitted after I last saw him on 08/29/16 for 3 days for decompensated heart failure. He was diuresed. A 2-D echo performed during his hospitalization revealed an ejection fraction of 25-30%. He did see James July, PA-C, and the advanced heart failure clinic yesterday. The intent was to slowly titrate his medications at the consider CRT given his left bundle branch branch block plus or minus ICD therapy.   Current Outpatient Prescriptions  Medication Sig Dispense Refill  . albuterol (VENTOLIN HFA) 108 (90 Base) MCG/ACT inhaler Inhale 2 puffs into the lungs every 4 (four) hours as needed for wheezing or shortness of breath. 1 Inhaler 0  . aspirin 81 MG chewable tablet Chew 81 mg by mouth at bedtime.    [name] atorvastatin (LIPITOR) 80 MG tablet Take 80 mg by mouth at bedtime.    . carvedilol (COREG) 6.25 MG tablet TAKE 1 TABLET BY MOUTH TWICE A DAY WITH A MEAL 30 tablet 8  . Cholecalciferol (VITAMIN D-3) 5000 UNITS TABS Take 5,000 Units by mouth 2 (two) times daily.    . cyclobenzaprine  (FLEXERIL) 5 MG tablet Take 1 tablet (5 mg total) by mouth 3 (three) times daily as needed for muscle spasms. Muscle spasm.    . DULoxetine (CYMBALTA) 60 MG capsule Take 60 mg by mouth daily.     . folic acid (FOLVITE) 1 MG tablet Take 1 mg by mouth daily.  1  . furosemide (LASIX) 40 MG tablet Take 1 tablet (40 mg total) by mouth daily. 30 tablet 0  . glimepiride (AMARYL) 4 MG tablet TAKE 1 TABLET TWICE DAILY WITH MEALS. DO NOT TAKE IF YOU DO NOT EAT. 180 tablet 1  . lidocaine (LIDODERM) 5 % Place 1 patch onto the skin daily as needed (pain).    [name] lisinopril (PRINIVIL,ZESTRIL) 5 MG tablet Take 1 tablet (5 mg total) by mouth daily. 30 tablet 0  . metFORMIN (GLUCOPHAGE) 500 MG tablet Take 1,000 mg by mouth 2 (two) times daily.  0  . methotrexate (RHEUMATREX) 2.5 MG tablet Take 15 mg by mouth every Friday. 6 tablets  1  . metoCLOPramide (REGLAN) 10 MG tablet Take 10 mg by mouth 2 (two) times daily before a meal.    . ondansetron (ZOFRAN ODT) 4 MG disintegrating tablet Take 1 tablet (4 mg total) by mouth every 8 (eight) hours as needed for nausea or vomiting. 20 tablet 0  . pantoprazole (PROTONIX) 40 MG tablet Take 1 tablet (40 mg total) by mouth 2 (two) times daily before a meal. 180 tablet 1  . pregabalin (LYRICA)  150 MG capsule Take 150 mg by mouth 2 (two) times daily.    [name] PRESCRIPTION MEDICATION by Intrathecal route continuous. Fentanyl 2mg /ml solution . Daily dose 0.8820 mg Compounded at [facility] [phone] Pump is refilled monthly    . ranitidine (ZANTAC) 300 MG tablet TAKE 1 TABLET BY MOUTH AT BEDTIME. 90 tablet 0  . spironolactone (ALDACTONE) 25 MG tablet Take 0.5 tablets (12.5 mg total) by mouth at bedtime. 45 tablet 3  . tamsulosin (FLOMAX) 0.4 MG CAPS capsule Take 1 capsule (0.4 mg total) by mouth daily after supper. 90 capsule 0  . Tapentadol HCl (NUCYNTA) 100 MG TABS Take 100 mg by mouth See admin instructions. Take 1 tablet (100 mg)n by mouth twice daily,  may also take 1 tablet during the day as needed for pain     No current facility-administered medications for this visit.     Allergies  Allergen Reactions  . Codeine Itching  . Morphine And Related Other (See Comments)    hallucinations    Social History   Social History  . Marital status: Married    Spouse name: karen  . Number of children: 2  . Years of education: N/A   Occupational History  . retired Management consultant    Social History Main Topics  . Smoking status: Former Smoker    Packs/day: 3.00    Years: 40.00    Types: Cigarettes    Quit date: 05/25/2002  . Smokeless tobacco: Never Used  . Alcohol use No  . Drug use: No  . Sexual activity: No   Other Topics Concern  . Not on file   Social History Narrative  . No narrative on file     Review of Systems: General: negative for chills, fever, night sweats or weight changes.  Cardiovascular: negative for chest pain, dyspnea on exertion, edema, orthopnea, palpitations, paroxysmal nocturnal dyspnea or shortness of breath Dermatological: negative for rash Respiratory: negative for cough or wheezing Urologic: negative for hematuria Abdominal: negative for nausea, vomiting, diarrhea, bright red blood per rectum, melena, or hematemesis Neurologic: negative for visual changes, syncope, or dizziness All other systems reviewed and are otherwise negative except as noted above.    Blood pressure 106/64, pulse 91, height 5\' 9"  (1.753 m), weight 190 lb (86.2 kg), SpO2 98 %.  General appearance: alert and no distress Neck: no adenopathy, no carotid bruit, no JVD, supple, symmetrical, trachea midline and thyroid not enlarged, symmetric, no tenderness/mass/nodules Lungs: clear to auscultation bilaterally Heart: regular rate and rhythm, S1, S2 normal, no murmur, click, rub or gallop Extremities: extremities normal, atraumatic, no cyanosis or edema  EKG not performed today  ASSESSMENT AND PLAN:   Non-ischemic  cardiomyopathy [facility]) Mr. James Parsons has nonischemic cardiomyopathy. He was recently admitted 08/29/16 for 3 days for decompensation and was diuresed. His EF was in the 25-30% range. He did see James July, PA in the advanced heart failure clinic yesterday who intends to slowly titrate his medications. He appears euvolemic on exam today. He is aware of salt restriction. 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 I'm going to slowly up titrate his carvedilol today. I agree that he would be a candidate for Entresto  therapy should his blood pressure tolerated. He is scheduled to see the heart failure clinic back in one month. I will see him back in 6 months for follow-up.  [name] MD FACP,FACC,FAHA, [facility] 09/09/2016 10:49 AM

## 2016-09-09 NOTE — Patient Instructions (Signed)
Medication Instructions: Your physician recommends that you continue on your current medications as directed. Please refer to the Current Medication list given to you today.  Increase Carvedilol to 9.375 mg (1 and 1/2 tablets) twice daily.   Follow-Up: Your physician wants you to follow-up in: 6 months with Dr. Gwenlyn Found. You will receive a reminder letter in the mail two months in advance. If you don't receive a letter, please call our office to schedule the follow-up appointment.  If you need a refill on your cardiac medications before your next appointment, please call your pharmacy.

## 2016-09-11 ENCOUNTER — Encounter: Payer: Self-pay | Admitting: Internal Medicine

## 2016-09-15 ENCOUNTER — Ambulatory Visit (HOSPITAL_COMMUNITY)
Admission: RE | Admit: 2016-09-15 | Discharge: 2016-09-15 | Disposition: A | Discharge status: Home / Self Care | Payer: Medicare Other | Source: Ambulatory Visit | Attending: Cardiology | Admitting: Cardiology

## 2016-09-15 DIAGNOSIS — I5042 Chronic combined systolic (congestive) and diastolic (congestive) heart failure: Secondary | ICD-10-CM | POA: Diagnosis present

## 2016-09-15 LAB — BASIC METABOLIC PANEL
ANION GAP: 9 (ref 5–15)
BUN: 7 mg/dL (ref 6–20)
CALCIUM: 9.3 mg/dL (ref 8.9–10.3)
CO2: 29 mmol/L (ref 22–32)
Chloride: 99 mmol/L — ABNORMAL LOW (ref 101–111)
Creatinine, Ser: 0.82 mg/dL (ref 0.61–1.24)
GFR calc Af Amer: >60 mL/min (ref >60)
GFR calc non Af Amer: >60 mL/min (ref >60)
Glucose, Bld: 164 mg/dL — ABNORMAL HIGH (ref 65–99)
Potassium: 3.6 mmol/L (ref 3.5–5.1)
Sodium: 137 mmol/L (ref 135–145)

## 2016-09-16 ENCOUNTER — Ambulatory Visit: Payer: Medicare Other | Admitting: Cardiovascular Disease

## 2016-09-21 ENCOUNTER — Other Ambulatory Visit: Payer: Self-pay | Admitting: Internal Medicine

## 2016-09-21 ENCOUNTER — Encounter: Payer: Self-pay | Admitting: Internal Medicine

## 2016-09-29 ENCOUNTER — Ambulatory Visit (INDEPENDENT_AMBULATORY_CARE_PROVIDER_SITE_OTHER): Payer: Medicare Other | Admitting: Internal Medicine

## 2016-09-29 VITALS — BP 112/76 | HR 80 | Temp 97.9°F | Resp 16 | Ht 67.0 in | Wt 193.0 lb

## 2016-09-29 DIAGNOSIS — F1190 Opioid use, unspecified, uncomplicated: Secondary | ICD-10-CM | POA: Diagnosis not present

## 2016-09-29 DIAGNOSIS — E782 Mixed hyperlipidemia: Secondary | ICD-10-CM | POA: Diagnosis not present

## 2016-09-29 DIAGNOSIS — G894 Chronic pain syndrome: Secondary | ICD-10-CM

## 2016-09-29 DIAGNOSIS — E559 Vitamin D deficiency, unspecified: Secondary | ICD-10-CM | POA: Diagnosis not present

## 2016-09-29 DIAGNOSIS — E1122 Type 2 diabetes mellitus with diabetic chronic kidney disease: Secondary | ICD-10-CM | POA: Diagnosis not present

## 2016-09-29 DIAGNOSIS — I10 Essential (primary) hypertension: Secondary | ICD-10-CM

## 2016-09-29 DIAGNOSIS — N182 Chronic kidney disease, stage 2 (mild): Secondary | ICD-10-CM

## 2016-09-29 DIAGNOSIS — Z79899 Other long term (current) drug therapy: Secondary | ICD-10-CM | POA: Diagnosis not present

## 2016-09-29 NOTE — Progress Notes (Signed)
This very nice 71 y.o. MWM presents for 6 month follow up with Hypertension, Hyperlipidemia, Pre-Diabetes and Vitamin D Deficiency.     Patient was recently hospitalized in Jan 2018 with acute on chronic CHF and was d/c'd on Lasix and then readmitted with postural hypotension & dehydration. 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 At the 2sd hospitalization . He was found hypoxic/tachypneic/tachycardic in respiratory distress and CT angio showed Pulm edema w/ small bilat effusions. Patient was treated with BiPAP and by discharge, he was weaned off O2.       Patient also has a Chronic Pain Syndrome s/p HNP alleged due to a work related accident in 2010 and is on chronic Opioids with a Fentanyl pump since 2010. Last surgery was a lumbar fusion in 01/2015 by Dr Rolena Infante.      Patient is treated for HTN (2010)  & BP has been controlled at home. Today's BP is at goal -  112/76.  Since hospitalizations above , patient has had no complaints of any cardiac type chest pain, palpitations, dyspnea/orthopnea/PND, dizziness, claudication, or dependent edema.     Hyperlipidemia is controlled with diet & meds. Patient denies myalgias or other med SE's. Last Lipids were at goal: Lab Results  Component Value Date   CHOL 105 06/27/2016   HDL 27 (L) 06/27/2016   LDLCALC 56 06/27/2016   LDLDIRECT 178.6 09/08/2009   TRIG 110 06/27/2016   CHOLHDL 3.9 06/27/2016      Also, the patient has history of Morbid Obesity (BMI 31+) and consequent T2_NIDDM and has had no symptoms of reactive hypoglycemia, diabetic polys, paresthesias or visual blurring.  Last A1c was not at goal: Lab Results  Component Value Date   HGBA1C 6.9 (H) 08/30/2016      Further, the patient also has history of Vitamin D Deficiency and supplements vitamin D without any suspected side-effects. Last vitamin D was still very low :   Lab Results  Component Value Date   VD25OH 36 03/23/2016      In 2013 , patient had a Rt TKR by Dr  Mayer Camel and currently is seeing Dr Mayer Camel for Lt Hip & Knee pains.  Current Outpatient Prescriptions on File Prior to Visit  Medication Sig  . albuterol (VENTOLIN HFA) 108 (90 Base) MCG/ACT inhaler Inhale 2 puffs into the lungs every 4 (four) hours as needed for wheezing or shortness of breath.  [name] aspirin 81 MG chewable tablet Chew 81 mg by mouth at bedtime.  [name] atorvastatin (LIPITOR) 80 MG tablet TAKE 1/2 TO 1 TABLET DAILY FOR CHOLESTEROL AS DIRECTED  . carvedilol (COREG) 6.25 MG tablet Take 1.5 tablets (9.375 mg total) by mouth 2 (two) times daily with a meal.  . Cholecalciferol (VITAMIN D-3) 5000 UNITS TABS Take 5,000 Units by mouth 2 (two) times daily.  . cyclobenzaprine (FLEXERIL) 5 MG tablet Take 1 tablet (5 mg total) by mouth 3 (three) times daily as needed for muscle spasms. Muscle spasm.  . DULoxetine (CYMBALTA) 60 MG capsule Take 60 mg by mouth daily.   . folic acid (FOLVITE) 1 MG tablet Take 1 mg by mouth daily.  . furosemide (LASIX) 40 MG tablet Take 1 tablet (40 mg total) by mouth daily.  [name] glimepiride (AMARYL) 4 MG tablet TAKE 1 TABLET TWICE DAILY WITH MEALS. DO NOT TAKE IF YOU DO NOT EAT.  [name] lidocaine (LIDODERM) 5 % Place 1 patch onto the skin daily as needed (pain).  [name] lisinopril (PRINIVIL,ZESTRIL) 5 MG  tablet Take 1 tablet (5 mg total) by mouth daily.  . metFORMIN (GLUCOPHAGE) 500 MG tablet Take 1,000 mg by mouth 2 (two) times daily.  . methotrexate (RHEUMATREX) 2.5 MG tablet Take 15 mg by mouth every Friday. 6 tablets  . metoCLOPramide (REGLAN) 10 MG tablet Take 10 mg by mouth 2 (two) times daily before a meal.  . ondansetron (ZOFRAN ODT) 4 MG disintegrating tablet Take 1 tablet (4 mg total) by mouth every 8 (eight) hours as needed for nausea or vomiting.  . pantoprazole (PROTONIX) 40 MG tablet Take 1 tablet (40 mg total) by mouth 2 (two) times daily before a meal.  . pregabalin (LYRICA) 150 MG capsule Take 150 mg by mouth 2 (two) times daily.  [name] PRESCRIPTION MEDICATION by  Intrathecal route continuous. Fentanyl 2mg /ml solution . Daily dose 0.8820 mg Compounded at [facility] [phone] Pump is refilled monthly  . ranitidine (ZANTAC) 300 MG tablet TAKE 1 TABLET BY MOUTH AT BEDTIME.  [name] spironolactone (ALDACTONE) 25 MG tablet Take 0.5 tablets (12.5 mg total) by mouth at bedtime.  . tamsulosin (FLOMAX) 0.4 MG CAPS capsule Take 1 capsule (0.4 mg total) by mouth daily after supper.  . Tapentadol HCl (NUCYNTA) 100 MG TABS Take 100 mg by mouth See admin instructions. Take 1 tablet (100 mg)n by mouth twice daily, may also take 1 tablet during the day as needed for pain   No current facility-administered medications on file prior to visit.    Allergies  Allergen Reactions  . Codeine Itching  . Morphine And Related Other (See Comments)    hallucinations   PMHx:   Past Medical History:  Diagnosis Date  . Allergy   . Anemia   . Arthritis   . Borderline hypertension   . CHF (congestive heart failure) ([place])   . Clotting disorder (HCC)    bleeds easily-no dx  . COPD (chronic obstructive pulmonary disease) ([place])    denies  . Depression    hx of   . Diabetes mellitus   . Diverticulosis of colon   . Full dentures   . GERD (gastroesophageal reflux disease)   . Hx of colonic polyps   . Hyperlipidemia   . Hypertension   . Lipoma   . Low back pain   . Mental disorder   . Obesity   . Pneumonia    hx  . RSD (reflex sympathetic dystrophy)   . Venous insufficiency    Immunization History  Administered Date(s) Administered  . Influenza Split 04/22/2011, 01/20/2012  . Influenza Whole 01/15/2007, 01/21/2009, 03/08/2010  . Influenza, High Dose Seasonal PF 02/07/2013, 12/10/2015  . Influenza-Unspecified 12/10/2013, 01/10/2015  . Pneumococcal Conjugate-13 06/09/2015  . Pneumococcal Polysaccharide-23 04/22/2011  . Tdap 04/11/2001, 05/07/2013   Past Surgical History:  Procedure Laterality Date  . BACK SURGERY  2005,2007  . C3-4  anterior cervical discectomy and fusion with plating at c3-4  05/2006   Dr. Arnoldo Morale  . CARDIAC CATHETERIZATION N/A 04/25/2016   Procedure: Right/Left Heart Cath and Coronary Angiography;  Surgeon: Peter M Martinique, MD;  Location: [facility];  Service: Cardiovascular;  Laterality: N/A;  . COLONOSCOPY    . COLONOSCOPY W/ POLYPECTOMY    . excision of lipoma from right olecranon area  11/2000   Dr. Rise [name]  . KNEE ARTHROSCOPY     right knee  . KNEE ARTHROSCOPY Right 06/06/2014   Procedure: ARTHROSCOPY RIGHT KNEE WITH REMOVAL OF FIBROUS BANDS;  Surgeon: Kerin Salen, MD;  Location: [facility];  Service: Orthopedics;  Laterality: Right;  . LUMBAR LAMINECTOMY/DECOMPRESSION MICRODISCECTOMY N/A 01/08/2014   Procedure: L4-S1 Decompression with removal and reimplantation of spinal cord stimulator battery ;  Surgeon: Melina Schools, MD;  Location: [facility];  Service: Orthopedics;  Laterality: N/A;  . microdiscectomy and decompression  05/2002   Dr. Tonita Cong  . morphine pump  2009   due to reaction to morphine/changed to fentanyl  . PAIN PUMP IMPLANTATION     with fentanyl  . spinal cord stimulator implanted     for pain per Dr. Maryruth Eve  . subcut pain pump implanted    . TONSILLECTOMY    . TOTAL KNEE ARTHROPLASTY  02/29/2012   Procedure: TOTAL KNEE ARTHROPLASTY;  Surgeon: Kerin Salen, MD;  Location: [facility];  Service: Orthopedics;  Laterality: Right;   FHx:    Reviewed / unchanged  SHx:    Reviewed / unchanged  Systems Review:  Constitutional: Denies fever, chills, wt changes, headaches, insomnia, fatigue, night sweats, change in appetite. Eyes: Denies redness, blurred vision, diplopia, discharge, itchy, watery eyes.  ENT: Denies discharge, congestion, post nasal drip, epistaxis, sore throat, earache, hearing loss, dental pain, tinnitus, vertigo, sinus pain, snoring.  CV: Denies chest pain, palpitations, irregular heartbeat, syncope, dyspnea, diaphoresis, orthopnea, PND, claudication  or edema. Respiratory: denies cough, dyspnea, DOE, pleurisy, hoarseness, laryngitis, wheezing.  Gastrointestinal: Denies dysphagia, odynophagia, heartburn, reflux, water brash, abdominal pain or cramps, nausea, vomiting, bloating, diarrhea, constipation, hematemesis, melena, hematochezia  or hemorrhoids. Genitourinary: Denies dysuria, frequency, urgency, nocturia, hesitancy, discharge, hematuria or flank pain. Musculoskeletal: Denies arthralgias, myalgias, stiffness, jt. swelling, pain, limping or strain/sprain.  Skin: Denies pruritus, rash, hives, warts, acne, eczema or change in skin lesion(s). Neuro: No weakness, tremor, incoordination, spasms, paresthesia or pain. Psychiatric: Denies confusion, memory loss or sensory loss. Endo: Denies change in weight, skin or hair change.  Heme/Lymph: No excessive bleeding, bruising or enlarged lymph nodes.  Physical Exam  BP 112/76   Pulse 80   Temp 97.9 F (36.6 C)   Resp 16   Ht 5\' 7"  (1.702 m)   Wt 193 lb (87.5 kg)   BMI 30.23 kg/m   Appears over nourished, well groomed  and in no distress.  Eyes: PERRLA, EOMs, conjunctiva no swelling or erythema. Sinuses: No frontal/maxillary tenderness ENT/Mouth: EAC's clear, TM's nl w/o erythema, bulging. Nares clear w/o erythema, swelling, exudates. Oropharynx clear without erythema or exudates. Oral hygiene is good. Tongue normal, non obstructing. Hearing intact.  Neck: Supple. Thyroid nl. Car 2+/2+ without bruits, nodes or JVD. Chest: Respirations nl with BS clear & equal w/o rales, rhonchi, wheezing or stridor.  Cor: Heart sounds normal w/ regular rate and rhythm without sig. murmurs, gallops, clicks or rubs. Peripheral pulses normal and equal  without edema.  Abdomen: Soft & bowel sounds normal. Non-tender w/o guarding, rebound, hernias, masses or organomegaly.  Lymphatics: Unremarkable.  Musculoskeletal: Full ROM all peripheral extremities, joint stability, 5/5 strength and normal gait.  Skin:  Warm, dry without exposed rashes, lesions or ecchymosis apparent.  Neuro: Cranial nerves intact, reflexes equal bilaterally. Sensory-motor testing grossly intact. Tendon reflexes grossly intact.  Pysch: Alert & oriented x 3.  Insight and judgement nl & appropriate. No ideations.  Assessment and Plan:  1. Essential hypertension  - Continue medication, monitor blood pressure at home.  - Continue DASH diet. Reminder to go to the ER if any CP,  SOB, nausea, dizziness, severe HA, changes vision/speech,  left arm numbness and tingling and jaw pain.  -  CBC with Differential/Platelet - BASIC METABOLIC PANEL WITH GFR - Magnesium - TSH  2. Hyperlipidemia, mixed  - Continue diet/meds, exercise,& lifestyle modifications.  - Continue monitor periodic cholesterol/liver & renal functions   - Hepatic function panel - Lipid panel - TSH  3. Type 2 diabetes mellitus, without long-term current use of insulin (HCC)  - Hemoglobin A1c - Insulin, random  4. Vitamin D deficiency  - Continue diet, exercise, lifestyle modifications.  - Monitor appropriate labs. - Continue supplementation. - VITAMIN D 25 Hydroxy  5. Chronic pain syndrome   6. Chronic, continuous use of opioids   7. Medication management  - CBC with Differential/Platelet - BASIC METABOLIC PANEL WITH GFR - Hepatic function panel - Magnesium - Lipid panel - TSH - Hemoglobin A1c - Insulin, random - VITAMIN D 25 Hydroxy        Discussed  regular exercise, BP monitoring, weight control to achieve/maintain BMI less than 25 and discussed med and SE's. Recommended labs to assess and monitor clinical status with further disposition pending results of labs. Over 30 minutes of exam, counseling, chart review was performed.

## 2016-09-29 NOTE — Patient Instructions (Signed)

## 2016-09-30 LAB — CBC WITH DIFFERENTIAL/PLATELET
Basophils Absolute: 0 cells/uL (ref 0–200)
Basophils Relative: 0 %
EOS PCT: 4 %
Eosinophils Absolute: 356 cells/uL (ref 15–500)
HCT: 37.6 % — ABNORMAL LOW (ref 38.5–50.0)
Hemoglobin: 11.8 g/dL — ABNORMAL LOW (ref 13.2–17.1)
Lymphocytes Relative: 23 %
Lymphs Abs: 2047 cells/uL (ref 850–3900)
MCH: 27.5 pg (ref 27.0–33.0)
MCHC: 31.4 g/dL — ABNORMAL LOW (ref 32.0–36.0)
MCV: 87.6 fL (ref 80.0–100.0)
MPV: 9.5 fL (ref 7.5–12.5)
Monocytes Absolute: 534 cells/uL (ref 200–950)
Monocytes Relative: 6 %
NEUTROS PCT: 67 %
Neutro Abs: 5963 cells/uL (ref 1500–7800)
PLATELETS: 331 10*3/uL (ref 140–400)
RBC: 4.29 MIL/uL (ref 4.20–5.80)
RDW: 17 % — ABNORMAL HIGH (ref 11.0–15.0)
WBC: 8.9 10*3/uL (ref 3.8–10.8)

## 2016-09-30 LAB — HEPATIC FUNCTION PANEL
ALK PHOS: 97 U/L (ref 40–115)
ALT: 13 U/L (ref 9–46)
AST: 14 U/L (ref 10–35)
Albumin: 4.1 g/dL (ref 3.6–5.1)
BILIRUBIN DIRECT: 0.1 mg/dL (ref <=0.2)
Indirect Bilirubin: 0.3 mg/dL (ref 0.2–1.2)
Total Bilirubin: 0.4 mg/dL (ref 0.2–1.2)
Total Protein: 7 g/dL (ref 6.1–8.1)

## 2016-09-30 LAB — LIPID PANEL
CHOL/HDL RATIO: 3.3 ratio (ref <5.0)
Cholesterol: 136 mg/dL (ref <200)
HDL: 41 mg/dL (ref >40)
LDL CALC: 57 mg/dL (ref <100)
Triglycerides: 191 mg/dL — ABNORMAL HIGH (ref <150)
VLDL: 38 mg/dL — ABNORMAL HIGH (ref <30)

## 2016-09-30 LAB — BASIC METABOLIC PANEL WITH GFR
BUN: 10 mg/dL (ref 7–25)
CHLORIDE: 103 mmol/L (ref 98–110)
CO2: 25 mmol/L (ref 20–31)
Calcium: 9.7 mg/dL (ref 8.6–10.3)
Creat: 0.75 mg/dL (ref 0.70–1.18)
GFR, Est African American: >89 mL/min (ref >=60)
GFR, Est Non African American: >89 mL/min (ref >=60)
Glucose, Bld: 137 mg/dL — ABNORMAL HIGH (ref 65–99)
POTASSIUM: 4.5 mmol/L (ref 3.5–5.3)
Sodium: 141 mmol/L (ref 135–146)

## 2016-09-30 LAB — MAGNESIUM: Magnesium: 2 mg/dL (ref 1.5–2.5)

## 2016-09-30 LAB — TSH: TSH: 3.19 m[IU]/L (ref 0.40–4.50)

## 2016-09-30 LAB — INSULIN, RANDOM: Insulin: 14.8 u[IU]/mL (ref 2.0–19.6)

## 2016-09-30 LAB — HEMOGLOBIN A1C
HEMOGLOBIN A1C: 6.9 % — AB (ref <5.7)
Mean Plasma Glucose: 151 mg/dL

## 2016-09-30 LAB — VITAMIN D 25 HYDROXY (VIT D DEFICIENCY, FRACTURES): Vit D, 25-Hydroxy: 58 ng/mL (ref 30–100)

## 2016-10-01 ENCOUNTER — Encounter: Payer: Self-pay | Admitting: Internal Medicine

## 2016-10-05 ENCOUNTER — Other Ambulatory Visit: Payer: Self-pay

## 2016-10-05 DIAGNOSIS — R1310 Dysphagia, unspecified: Secondary | ICD-10-CM

## 2016-10-05 DIAGNOSIS — K219 Gastro-esophageal reflux disease without esophagitis: Secondary | ICD-10-CM

## 2016-10-05 DIAGNOSIS — R1319 Other dysphagia: Secondary | ICD-10-CM

## 2016-10-05 MED ORDER — PANTOPRAZOLE SODIUM 40 MG PO TBEC: 40.0000 mg | DELAYED_RELEASE_TABLET | Freq: Two times a day (BID) | ORAL | 3 refills | Status: DC

## 2016-10-06 ENCOUNTER — Ambulatory Visit (HOSPITAL_COMMUNITY)
Admission: RE | Admit: 2016-10-06 | Discharge: 2016-10-06 | Disposition: A | Discharge status: Home / Self Care | Payer: Medicare Other | Source: Ambulatory Visit | Attending: Cardiology | Admitting: Cardiology

## 2016-10-06 VITALS — BP 126/70 | HR 93 | Wt 190.8 lb

## 2016-10-06 DIAGNOSIS — Z79899 Other long term (current) drug therapy: Secondary | ICD-10-CM | POA: Diagnosis not present

## 2016-10-06 DIAGNOSIS — Z6830 Body mass index (BMI) 30.0-30.9, adult: Secondary | ICD-10-CM

## 2016-10-06 DIAGNOSIS — E669 Obesity, unspecified: Secondary | ICD-10-CM | POA: Insufficient documentation

## 2016-10-06 DIAGNOSIS — Z7982 Long term (current) use of aspirin: Secondary | ICD-10-CM | POA: Diagnosis not present

## 2016-10-06 DIAGNOSIS — I872 Venous insufficiency (chronic) (peripheral): Secondary | ICD-10-CM | POA: Diagnosis not present

## 2016-10-06 DIAGNOSIS — G9050 Complex regional pain syndrome I, unspecified: Secondary | ICD-10-CM | POA: Insufficient documentation

## 2016-10-06 DIAGNOSIS — Z809 Family history of malignant neoplasm, unspecified: Secondary | ICD-10-CM | POA: Diagnosis not present

## 2016-10-06 DIAGNOSIS — I10 Essential (primary) hypertension: Secondary | ICD-10-CM

## 2016-10-06 DIAGNOSIS — I447 Left bundle-branch block, unspecified: Secondary | ICD-10-CM | POA: Diagnosis not present

## 2016-10-06 DIAGNOSIS — J449 Chronic obstructive pulmonary disease, unspecified: Secondary | ICD-10-CM | POA: Diagnosis not present

## 2016-10-06 DIAGNOSIS — Z7984 Long term (current) use of oral hypoglycemic drugs: Secondary | ICD-10-CM | POA: Diagnosis not present

## 2016-10-06 DIAGNOSIS — I110 Hypertensive heart disease with heart failure: Secondary | ICD-10-CM | POA: Insufficient documentation

## 2016-10-06 DIAGNOSIS — M549 Dorsalgia, unspecified: Secondary | ICD-10-CM | POA: Insufficient documentation

## 2016-10-06 DIAGNOSIS — E119 Type 2 diabetes mellitus without complications: Secondary | ICD-10-CM | POA: Insufficient documentation

## 2016-10-06 DIAGNOSIS — I5022 Chronic systolic (congestive) heart failure: Secondary | ICD-10-CM

## 2016-10-06 DIAGNOSIS — F329 Major depressive disorder, single episode, unspecified: Secondary | ICD-10-CM | POA: Diagnosis not present

## 2016-10-06 DIAGNOSIS — I428 Other cardiomyopathies: Secondary | ICD-10-CM | POA: Diagnosis not present

## 2016-10-06 DIAGNOSIS — Z888 Allergy status to other drugs, medicaments and biological substances status: Secondary | ICD-10-CM | POA: Diagnosis not present

## 2016-10-06 DIAGNOSIS — I2510 Atherosclerotic heart disease of native coronary artery without angina pectoris: Secondary | ICD-10-CM

## 2016-10-06 DIAGNOSIS — E6609 Other obesity due to excess calories: Secondary | ICD-10-CM | POA: Diagnosis not present

## 2016-10-06 DIAGNOSIS — Z87891 Personal history of nicotine dependence: Secondary | ICD-10-CM | POA: Insufficient documentation

## 2016-10-06 DIAGNOSIS — E782 Mixed hyperlipidemia: Secondary | ICD-10-CM | POA: Diagnosis not present

## 2016-10-06 MED ORDER — LOSARTAN POTASSIUM 25 MG PO TABS: 12.5000 mg | ORAL_TABLET | Freq: Every day | ORAL | 3 refills | Status: DC

## 2016-10-06 NOTE — Progress Notes (Signed)
Advanced Heart Failure Medication Review by a Pharmacist  Does the patient  feel that his/her medications are working for him/her?  yes  Has the patient been experiencing any side effects to the medications prescribed?  no  Does the patient measure his/her own blood pressure or blood glucose at home?  yes   Does the patient have any problems obtaining medications due to transportation or finances?   no  Understanding of regimen: good Understanding of indications: good Potential of compliance: good Patient understands to avoid NSAIDs. Patient understands to avoid decongestants.  Issues to address at subsequent visits: none.   Pharmacist comments: James Parsons is a pleasant 71 y/o male who presents with his wife who manages his medications. Patient endorses adherence to his medication regimen. Patient's wife commented that they had been having trouble cutting their pills with some wire cutters, and we were able to get them a pill cutter. No medication-related questions or concerns for me at this time.   Phillis Knack PharmD Candidate  Time with patient: 10 minutes Preparation and documentation time: 10  minutes Total time: 20 minute

## 2016-10-06 NOTE — Progress Notes (Signed)
Advanced Heart Failure Clinic Note   Primary Care: Dr. [name] Primary Cardiologist: Dr. Gwenlyn Found HF: New  HPI:  James Parsons is a 71 y.o. male with PMH of Chronic systolic CHF with EF 25-95%, non-critical CAD, HTN, mixed HLD, DM2, and chronic back pain with spinal stimulator and fentanyl pump.    Admitted 5/21 -> 09/01/16 with acute SOB. Diuresed with IV lasix and discharged on po lasix 40 mg daily (Increase from his chronic dose).  He initially desaturated with ambulation on RA, but ultimately did not require 02 with distances of up to 400 feet. Discharge weight 183 lbs  James Parsons presents today for follow up. At last visit added spiro. Pt didn't feel any different. Denies lightheadedness or dizziness. Saw Dr. Gwenlyn Found the next day and coreg increased.  No symptoms. Breathing has been excellent. No SOB on flat ground.  No bendopnea. Denies orthopnea, but having trouble getting to sleep. Sleeps on 2 pillows chronically.  Had his spinal stimulator taken out and actually feels much better. Denies chest pain.   R/LHC 04/25/2016  There is moderate to severe left ventricular systolic dysfunction.  LV end diastolic pressure is normal.  The left ventricular ejection fraction is 25-35% by visual estimate.  Mid RCA lesion, 40 %stenosed.  Ost Ramus to Ramus lesion, 60 %stenosed.  Ost LAD to Prox LAD lesion, 30 %stenosed.  Prox LAD to Mid LAD lesion, 20 %stenosed.  Hemodynamic findings consistent with mild pulmonary hypertension.  LV end diastolic pressure is normal. RHC Procedural Findings: Hemodynamics (mmHg) RA mean 7 RV 35/4 (10) PA 33/17 (25) PCWP 16 AO 121/60 Cardiac Output (Fick) 5.35 Cardiac Index (Fick) 2.61   Echo 08/26/2016 with LVEF 25-30%, mild James, mild LAE  Social: 2 sons, 4 grandchildren. Retired Engineer, structural. Former smoker with > 100 pack years of tobacco abuse.  Stopped smoking in early 2000s. Has been married for 47 years this year.   Past Medical History 1.  Chronic systolic CHF with EF 63-87% - Down from 40-45% 04/2016 2. non-critical CAD - Out of proportion to his HF.   3. HTN - Well controlled.  4. Mixed HLD - On atorvastatin. 5. DM2 - Per PCP 6. Chronic back pain with spinal stimulator and fentanyl pump.   - To have Spinal stimulator removed 09/2016 7. LBBB - QRS 158 EKG 09/08/16. If EF remains depressed, Will likely benefit from CRT-P/D.   Review of systems complete and found to be negative unless listed in HPI.     Past Medical History:  Diagnosis Date  . Allergy   . Anemia   . Arthritis   . Borderline hypertension   . CHF (congestive heart failure) ([place])   . Clotting disorder (HCC)    bleeds easily-no dx  . COPD (chronic obstructive pulmonary disease) ([place])    denies  . Depression    hx of   . Diabetes mellitus   . Diverticulosis of colon   . Full dentures   . GERD (gastroesophageal reflux disease)   . Hx of colonic polyps   . Hyperlipidemia   . Hypertension   . Lipoma   . Low back pain   . Mental disorder   . Obesity   . Pneumonia    hx  . RSD (reflex sympathetic dystrophy)   . Venous insufficiency     Current Outpatient Prescriptions  Medication Sig Dispense Refill  . albuterol (VENTOLIN HFA) 108 (90 Base) MCG/ACT inhaler Inhale 2 puffs into the lungs every 4 (four) hours as  needed for wheezing or shortness of breath. 1 Inhaler 0  . aspirin 81 MG chewable tablet Chew 81 mg by mouth at bedtime.    [name] atorvastatin (LIPITOR) 80 MG tablet TAKE 1/2 TO 1 TABLET DAILY FOR CHOLESTEROL AS DIRECTED 90 tablet 1  . carvedilol (COREG) 6.25 MG tablet Take 1.5 tablets (9.375 mg total) by mouth 2 (two) times daily with a meal. 90 tablet 6  . Cholecalciferol (VITAMIN D-3) 5000 UNITS TABS Take 5,000 Units by mouth 2 (two) times daily.    . cyclobenzaprine (FLEXERIL) 5 MG tablet Take 1 tablet (5 mg total) by mouth 3 (three) times daily as needed for muscle spasms. Muscle spasm.    . DULoxetine (CYMBALTA) 60 MG capsule Take 60 mg  by mouth daily.     . folic acid (FOLVITE) 1 MG tablet Take 1 mg by mouth daily.  1  . furosemide (LASIX) 40 MG tablet Take 1 tablet (40 mg total) by mouth daily. 30 tablet 0  . glimepiride (AMARYL) 4 MG tablet TAKE 1 TABLET TWICE DAILY WITH MEALS. DO NOT TAKE IF YOU DO NOT EAT. 180 tablet 1  . lidocaine (LIDODERM) 5 % Place 1 patch onto the skin daily as needed (pain).    [name] lisinopril (PRINIVIL,ZESTRIL) 5 MG tablet Take 1 tablet (5 mg total) by mouth daily. 30 tablet 0  . metFORMIN (GLUCOPHAGE) 500 MG tablet Take 1,000 mg by mouth 2 (two) times daily.  0  . methotrexate (RHEUMATREX) 2.5 MG tablet Take 15 mg by mouth every Friday. 6 tablets  1  . metoCLOPramide (REGLAN) 10 MG tablet Take 10 mg by mouth 2 (two) times daily before a meal.    . ondansetron (ZOFRAN ODT) 4 MG disintegrating tablet Take 1 tablet (4 mg total) by mouth every 8 (eight) hours as needed for nausea or vomiting. 20 tablet 0  . pantoprazole (PROTONIX) 40 MG tablet Take 1 tablet (40 mg total) by mouth 2 (two) times daily before a meal. 180 tablet 3  . pregabalin (LYRICA) 150 MG capsule Take 150 mg by mouth 2 (two) times daily.    [name] PRESCRIPTION MEDICATION by Intrathecal route continuous. Fentanyl 2mg /ml solution . Daily dose 0.8820 mg Compounded at [facility] [phone] Pump is refilled monthly    . ranitidine (ZANTAC) 300 MG tablet TAKE 1 TABLET BY MOUTH AT BEDTIME. 90 tablet 0  . spironolactone (ALDACTONE) 25 MG tablet Take 0.5 tablets (12.5 mg total) by mouth at bedtime. 45 tablet 3  . tamsulosin (FLOMAX) 0.4 MG CAPS capsule Take 1 capsule (0.4 mg total) by mouth daily after supper. 90 capsule 0  . Tapentadol HCl (NUCYNTA) 100 MG TABS Take 100 mg by mouth See admin instructions. Take 1 tablet (100 mg)n by mouth twice daily, may also take 1 tablet during the day as needed for pain     No current facility-administered medications for this encounter.     Allergies  Allergen Reactions  . Codeine  Itching  . Morphine And Related Other (See Comments)    hallucinations      Social History   Social History  . Marital status: Married    Spouse name: karen  . Number of children: 2  . Years of education: N/A   Occupational History  . retired Management consultant    Social History Main Topics  . Smoking status: Former Smoker    Packs/day: 3.00    Years: 40.00    Types: Cigarettes    Quit  date: 05/25/2002  . Smokeless tobacco: Never Used  . Alcohol use No  . Drug use: No  . Sexual activity: No   Other Topics Concern  . Not on file   Social History Narrative  . No narrative on file      Family History  Problem Relation Age of Onset  . Cancer Father   . Colon cancer Neg Hx   . Esophageal cancer Neg Hx   . Stomach cancer Neg Hx   . Rectal cancer Neg Hx     Vitals:   10/06/16 0934  BP: 126/70  Pulse: 93  SpO2: 98%  Weight: 190 lb 12.8 oz (86.5 kg)   Wt Readings from Last 3 Encounters:  10/06/16 190 lb 12.8 oz (86.5 kg)  09/29/16 193 lb (87.5 kg)  09/09/16 190 lb (86.2 kg)    PHYSICAL EXAM: General: Well appearing. No resp difficulty. HEENT: Normal Neck: Supple. JVP 6-7. Carotids 2+ bilat; no bruits. No thyromegaly or nodule noted. Cor: PMI nondisplaced. RRR, No M/G/R noted Lungs: CTAB, normal effort. Abdomen: Soft, non-tender, non-distended, no HSM. No bruits or masses. +BS  Extremities: No cyanosis, clubbing, rash, R and LLE no edema.  Neuro: Alert & orientedx3, cranial nerves grossly intact. moves all 4 extremities w/o difficulty. Affect pleasant   ECG: Previously reviewed. NSR, 84 bpm LBBB - QRS 158 ms  ASSESSMENT & PLAN:  1. Chronic systolic CHF with EF 92-33% - NYHA II currently. Mostly limited by chronic back pain.  - Volume status stable on exam - Continue lasix 40 mg daily. Can take extra as needed.  - Drop in EF out of proportion to his CAD on cath as above.  ? If due to LBBB. If EF does not improve on GDMT, will need to consider  CRT.  - Stop lisinopril. Start Losartan 12.5 mg qhs. BMET 7-10 days.  - Continue coreg 9.375 mg BID - Continue spiro 12.5 mg qhs.  - Reinforced fluid restriction to < 2 L daily, sodium restriction to less than 2000 mg daily, and the importance of daily weights.   2. non-critical CAD - No chest pain. No change.  - Continue ASA and statin.  3. HTN - Meds as above.  4. Mixed HLD - Per Dr. Gwenlyn Found. No change.  5. DM2 - Per PCP 6. Chronic back pain with spinal stimulator and fentanyl pump.   - Spinal stimulator was aggravating his back. Much improved with removal.  7. LBBB - QRS 158 - If EF remains depressed, may benefit from CRT as above. No change.   Meds as above. Labs 7-10 days. Recommended OTC Melatonin for sleep.   RTC 4 weeks with Pharm-D for med titration. Follow up with Provider 2 months.   Shirley Friar, PA-C 10/06/16   Greater than 50% of the 25 minute visit was spent in counseling/coordination of care regarding disease state education, medication reconciliation, sliding scale diuretics, salt/fluid restriction, and sleep hygiene.

## 2016-10-06 NOTE — Patient Instructions (Signed)
STOP Lisinopril START Losartan 12.5 mg, (one half tab) daily at bedtime START OTC Melatonin 3 mg, daily at bedtime as needed for sleep (you may increase up to 6 mg)  Labs needed in 7-10 days  Your physician recommends that you schedule a follow-up appointment in: 4 weeks with Doroteo Bradford, PharmD  Your physician recommends that you schedule a follow-up appointment in: 2 months with Rebecca Eaton   Do the following things EVERYDAY: 1) Weigh yourself in the morning before breakfast. Write it down and keep it in a log. 2) Take your medicines as prescribed 3) Eat low salt foods-Limit salt (sodium) to 2000 mg per day.  4) Stay as active as you can everyday 5) Limit all fluids for the day to less than 2 liters

## 2016-10-17 ENCOUNTER — Ambulatory Visit (HOSPITAL_COMMUNITY)
Admission: RE | Admit: 2016-10-17 | Discharge: 2016-10-17 | Disposition: A | Discharge status: Home / Self Care | Payer: Medicare Other | Source: Ambulatory Visit | Attending: Internal Medicine | Admitting: Internal Medicine

## 2016-10-17 DIAGNOSIS — I5022 Chronic systolic (congestive) heart failure: Secondary | ICD-10-CM

## 2016-10-17 LAB — BASIC METABOLIC PANEL WITH GFR
Anion gap: 10 (ref 5–15)
BUN: 12 mg/dL (ref 6–20)
CO2: 28 mmol/L (ref 22–32)
Calcium: 9.2 mg/dL (ref 8.9–10.3)
Chloride: 100 mmol/L — ABNORMAL LOW (ref 101–111)
Creatinine, Ser: 0.75 mg/dL (ref 0.61–1.24)
GFR calc Af Amer: >60 mL/min
GFR calc non Af Amer: >60 mL/min
Glucose, Bld: 187 mg/dL — ABNORMAL HIGH (ref 65–99)
Potassium: 3.7 mmol/L (ref 3.5–5.1)
Sodium: 138 mmol/L (ref 135–145)

## 2016-11-07 ENCOUNTER — Ambulatory Visit (HOSPITAL_COMMUNITY)
Admission: RE | Admit: 2016-11-07 | Discharge: 2016-11-07 | Disposition: A | Discharge status: Home / Self Care | Payer: Medicare Other | Source: Ambulatory Visit | Attending: Internal Medicine | Admitting: Internal Medicine

## 2016-11-07 DIAGNOSIS — I2510 Atherosclerotic heart disease of native coronary artery without angina pectoris: Secondary | ICD-10-CM | POA: Insufficient documentation

## 2016-11-07 DIAGNOSIS — I447 Left bundle-branch block, unspecified: Secondary | ICD-10-CM | POA: Diagnosis not present

## 2016-11-07 DIAGNOSIS — M549 Dorsalgia, unspecified: Secondary | ICD-10-CM | POA: Insufficient documentation

## 2016-11-07 DIAGNOSIS — E782 Mixed hyperlipidemia: Secondary | ICD-10-CM | POA: Diagnosis not present

## 2016-11-07 DIAGNOSIS — I5022 Chronic systolic (congestive) heart failure: Secondary | ICD-10-CM | POA: Insufficient documentation

## 2016-11-07 DIAGNOSIS — I110 Hypertensive heart disease with heart failure: Secondary | ICD-10-CM | POA: Diagnosis not present

## 2016-11-07 DIAGNOSIS — G8929 Other chronic pain: Secondary | ICD-10-CM | POA: Insufficient documentation

## 2016-11-07 DIAGNOSIS — E119 Type 2 diabetes mellitus without complications: Secondary | ICD-10-CM | POA: Diagnosis not present

## 2016-11-07 MED ORDER — SACUBITRIL-VALSARTAN 24-26 MG PO TABS: 1.0000 | ORAL_TABLET | Freq: Two times a day (BID) | ORAL | 5 refills | Status: DC

## 2016-11-07 NOTE — Patient Instructions (Addendum)
It was great to see you today!  Please STOP LOSARTAN and START Entresto 24-26 mg TWICE DAILY.   Please START measuring your blood pressure and weight at the same time in the morning every day and bring a log with you to your next visit.   Please keep your appointment with Oda Kilts, PA-C on 8/22.

## 2016-11-07 NOTE — Progress Notes (Signed)
HF MD: James Parsons  HPI:  James Parsons is a 71 y.o. Caucasian male with PMH of Chronic systolic CHF with EF 47-82%, non-critical CAD, HTN, mixed HLD, DM2, and chronic back pain with spinal stimulator and fentanyl pump.    Admitted 5/21 -> 09/01/16 with acute SOB. Diuresed with IV lasix and discharged on po lasix 40 mg daily (Increase from his chronic dose).  He initially desaturated with ambulation on RA, but ultimately did not require 02 with distances of up to 400 feet. Discharge weight 183 lbs  James Parsons presents today with his wife who manages his medications for pharmacist-led HF medication titration. At last HF clinic visit on 10/06/16, he was switched from lisinopril to losartan 12.5 mg daily and started on melatonin for sleep. He states that he has been sleeping better and has felt so well that he does not check his weight or BP as regularly as he used to. He and his wife just bought a house in [place] in anticipation of his wife's retirement in September. Weight is slightly higher than normal today but he had his keys and other items in his pockets that may have contributed to the increase. His wife also stated that she is unsure if he has been taking carvedilol 9.375 mg BID or 6.25 mg BID but will check when home.     [name] Shortness of breath/dyspnea on exertion? no  . Orthopnea/PND? Yes - stable on 2 pillows chronically . Edema? no . Lightheadedness/dizziness? no . Daily weights at home? No - 192-193 lb when was weighing at home  . Blood pressure/heart rate monitoring at home? no . Following low-sodium/fluid-restricted diet? Yes - uses "no salt" salt substitute  HF Medications: Carvedilol 9.375 mg PO BID Furosemide 40 mg PO daily Losartan 12.5 mg PO QHS Spironolactone 12.5 mg PO daily   Has the patient been experiencing any side effects to the medications prescribed?  no  Does the patient have any problems obtaining medications due to transportation or finances?   No - UHC  Medicare    Understanding of regimen: good Understanding of indications: good Potential of compliance: good Patient understands to avoid NSAIDs. Patient understands to avoid decongestants.    Pertinent Lab Values: . 10/17/16: Serum creatinine 0.75, BUN 12, Potassium 3.7, Sodium 138 . 09/29/16: Magnesium 2.0 . 08/29/16: BNP 209  Vital Signs: . Weight: 198 lb (dry weight: 188-190 lb) . Blood pressure: 120/60 mmHg  . Heart rate: 73 bpm    Assessment: 1. Chronicsystolic CHF (EF 95-62%), due to ?HTN (CAD out of proportion to his HF). NYHA class II symptoms (limited 2/2 arthritis/walks with a cane).  - Volume status stable on exam despite weight increase  - Switch from losartan to Entresto 24-26 mg BID which should help with volume status if this is contributing to weight gain  - Continue furosemide 40 mg daily (can take an additional 40 mg PRN for weight gain/SOB), carvedilol 9.375 mg BID (will verify dose when home), spironolactone 12.5 mg daily   - Restart logging daily weight and BP  - Basic disease state pathophysiology, medication indication, mechanism and side effects reviewed at length with patient and he verbalized understanding 2. Non-critical CAD - No chest pain. No change.  - Continue ASA and statin.  3. HTN - Well controlled - Meds as above 4. Mixed HLD - Per Dr. Gwenlyn Found. No change.  5. DM2 - Per PCP 6. Chronic back pain with spinal stimulator and fentanyl pump.   - Spinal stimulator  was aggravating his back. Much improved with removal.  7. LBBB - QRS 158 - If EF remains depressed, may benefit from CRT as above. No change.   Plan: 1) Medication changes: Based on clinical presentation, vital signs and recent labs will switch from losartan to Entresto 24-26 mg BID  2) Labs: BMET in 2 weeks 3) Follow-up: Oda Kilts, PA-C on 11/30/16   Ruta Hinds. Velva Harman, PharmD, BCPS, CPP Clinical Pharmacist Pager: [phone] Phone: [phone] 11/07/2016 2:37 PM

## 2016-11-09 DEATH — deceased

## 2016-11-19 ENCOUNTER — Other Ambulatory Visit: Payer: Self-pay | Admitting: Physician Assistant

## 2016-11-19 ENCOUNTER — Other Ambulatory Visit: Payer: Self-pay | Admitting: Internal Medicine

## 2016-11-19 DIAGNOSIS — E119 Type 2 diabetes mellitus without complications: Secondary | ICD-10-CM

## 2016-11-19 MED ORDER — METFORMIN HCL ER 500 MG PO TB24: ORAL_TABLET | ORAL | 1 refills | Status: DC

## 2016-11-27 ENCOUNTER — Other Ambulatory Visit: Payer: Self-pay | Admitting: Physician Assistant

## 2016-11-30 ENCOUNTER — Encounter (HOSPITAL_COMMUNITY): Payer: Self-pay

## 2016-11-30 ENCOUNTER — Ambulatory Visit (HOSPITAL_COMMUNITY)
Admission: RE | Admit: 2016-11-30 | Discharge: 2016-11-30 | Disposition: A | Discharge status: Home / Self Care | Payer: Medicare Other | Source: Ambulatory Visit | Attending: Cardiology | Admitting: Cardiology

## 2016-11-30 VITALS — BP 114/58 | HR 91 | Wt 199.3 lb

## 2016-11-30 DIAGNOSIS — K219 Gastro-esophageal reflux disease without esophagitis: Secondary | ICD-10-CM | POA: Diagnosis not present

## 2016-11-30 DIAGNOSIS — E669 Obesity, unspecified: Secondary | ICD-10-CM | POA: Insufficient documentation

## 2016-11-30 DIAGNOSIS — G9050 Complex regional pain syndrome I, unspecified: Secondary | ICD-10-CM | POA: Insufficient documentation

## 2016-11-30 DIAGNOSIS — Z87891 Personal history of nicotine dependence: Secondary | ICD-10-CM | POA: Insufficient documentation

## 2016-11-30 DIAGNOSIS — E782 Mixed hyperlipidemia: Secondary | ICD-10-CM | POA: Insufficient documentation

## 2016-11-30 DIAGNOSIS — Z6830 Body mass index (BMI) 30.0-30.9, adult: Secondary | ICD-10-CM

## 2016-11-30 DIAGNOSIS — I872 Venous insufficiency (chronic) (peripheral): Secondary | ICD-10-CM | POA: Diagnosis not present

## 2016-11-30 DIAGNOSIS — Z7984 Long term (current) use of oral hypoglycemic drugs: Secondary | ICD-10-CM | POA: Diagnosis not present

## 2016-11-30 DIAGNOSIS — Z7982 Long term (current) use of aspirin: Secondary | ICD-10-CM | POA: Insufficient documentation

## 2016-11-30 DIAGNOSIS — J449 Chronic obstructive pulmonary disease, unspecified: Secondary | ICD-10-CM | POA: Diagnosis not present

## 2016-11-30 DIAGNOSIS — Z800 Family history of malignant neoplasm of digestive organs: Secondary | ICD-10-CM | POA: Diagnosis not present

## 2016-11-30 DIAGNOSIS — I447 Left bundle-branch block, unspecified: Secondary | ICD-10-CM

## 2016-11-30 DIAGNOSIS — E119 Type 2 diabetes mellitus without complications: Secondary | ICD-10-CM | POA: Diagnosis not present

## 2016-11-30 DIAGNOSIS — Z885 Allergy status to narcotic agent status: Secondary | ICD-10-CM | POA: Insufficient documentation

## 2016-11-30 DIAGNOSIS — F329 Major depressive disorder, single episode, unspecified: Secondary | ICD-10-CM | POA: Insufficient documentation

## 2016-11-30 DIAGNOSIS — Z809 Family history of malignant neoplasm, unspecified: Secondary | ICD-10-CM | POA: Diagnosis not present

## 2016-11-30 DIAGNOSIS — I2510 Atherosclerotic heart disease of native coronary artery without angina pectoris: Secondary | ICD-10-CM

## 2016-11-30 DIAGNOSIS — I5022 Chronic systolic (congestive) heart failure: Secondary | ICD-10-CM | POA: Diagnosis present

## 2016-11-30 DIAGNOSIS — I519 Heart disease, unspecified: Secondary | ICD-10-CM | POA: Diagnosis not present

## 2016-11-30 DIAGNOSIS — Z6831 Body mass index (BMI) 31.0-31.9, adult: Secondary | ICD-10-CM | POA: Diagnosis not present

## 2016-11-30 DIAGNOSIS — I10 Essential (primary) hypertension: Secondary | ICD-10-CM | POA: Diagnosis not present

## 2016-11-30 DIAGNOSIS — E6609 Other obesity due to excess calories: Secondary | ICD-10-CM | POA: Diagnosis not present

## 2016-11-30 DIAGNOSIS — I110 Hypertensive heart disease with heart failure: Secondary | ICD-10-CM | POA: Insufficient documentation

## 2016-11-30 LAB — BASIC METABOLIC PANEL
Anion gap: 7 (ref 5–15)
BUN: 10 mg/dL (ref 6–20)
CALCIUM: 9.3 mg/dL (ref 8.9–10.3)
CO2: 28 mmol/L (ref 22–32)
CREATININE: 0.8 mg/dL (ref 0.61–1.24)
Chloride: 102 mmol/L (ref 101–111)
GFR calc non Af Amer: >60 mL/min (ref >60)
GLUCOSE: 245 mg/dL — AB (ref 65–99)
Potassium: 4.5 mmol/L (ref 3.5–5.1)
Sodium: 137 mmol/L (ref 135–145)

## 2016-11-30 MED ORDER — CARVEDILOL 12.5 MG PO TABS: 12.5000 mg | ORAL_TABLET | Freq: Two times a day (BID) | ORAL | 3 refills | Status: DC

## 2016-11-30 NOTE — Patient Instructions (Signed)
Change Carvedilol to 12.5 mg (1 tab daily) twice a day  Labs drawn today  Your physician recommends that you schedule a follow-up appointment in: 8 weeks

## 2016-11-30 NOTE — Progress Notes (Signed)
Advanced Heart Failure Clinic Note   Primary Care: Dr. [name] Primary Cardiologist: Dr. Gwenlyn Found HF: New  HPI:  James Parsons is a 71 y.o. male with PMH of Chronic systolic CHF with EF 47-09%, non-critical CAD, HTN, mixed HLD, DM2, and chronic back pain with spinal stimulator and fentanyl pump.    Admitted 5/21 -> 09/01/16 with acute SOB. Diuresed with IV lasix and discharged on po lasix 40 mg daily (Increase from his chronic dose).  He initially desaturated with ambulation on RA, but ultimately did not require 02 with distances of up to 400 feet. Discharge weight 183 lbs  He presents today for regular follow up.  Recently prescribed Entresto, had prefilled pill boxes so just started 11/27/16. Denies lightheadedness or dizziness. Denies any SOB. Weedeating and weeding his garden without SOB. Main complaint is still back pain, but this is stable.  Sleeps on 2 pillows chronically. Denies orthopnea or PND. Denies CP.   R/LHC 04/25/2016  There is moderate to severe left ventricular systolic dysfunction.  LV end diastolic pressure is normal.  The left ventricular ejection fraction is 25-35% by visual estimate.  Mid RCA lesion, 40 %stenosed.  Ost Ramus to Ramus lesion, 60 %stenosed.  Ost LAD to Prox LAD lesion, 30 %stenosed.  Prox LAD to Mid LAD lesion, 20 %stenosed.  Hemodynamic findings consistent with mild pulmonary hypertension.  LV end diastolic pressure is normal. RHC Procedural Findings: Hemodynamics (mmHg) RA mean 7 RV 35/4 (10) PA 33/17 (25) PCWP 16 AO 121/60 Cardiac Output (Fick) 5.35 Cardiac Index (Fick) 2.61   Echo 08/26/2016 with LVEF 25-30%, mild MR, mild LAE  Social: 2 sons, 4 grandchildren. Retired Engineer, structural. Former smoker with > 100 pack years of tobacco abuse.  Stopped smoking in early 2000s. Has been married for 47 years this year.   Past Medical History 1. Chronic systolic CHF with EF 62-83% - Down from 40-45% 04/2016 2. non-critical CAD - Out of  proportion to his HF.   3. HTN - Well controlled.  4. Mixed HLD - On atorvastatin. 5. DM2 - Per PCP 6. Chronic back pain with spinal stimulator and fentanyl pump.   - To have Spinal stimulator removed 09/2016 7. LBBB - QRS 158 EKG 09/08/16. If EF remains depressed, Will likely benefit from CRT-P/D.   Review of systems complete and found to be negative unless listed in HPI.    Past Medical History:  Diagnosis Date  . Allergy   . Anemia   . Arthritis   . Borderline hypertension   . CHF (congestive heart failure) ([place])   . Clotting disorder (HCC)    bleeds easily-no dx  . COPD (chronic obstructive pulmonary disease) ([place])    denies  . Depression    hx of   . Diabetes mellitus   . Diverticulosis of colon   . Full dentures   . GERD (gastroesophageal reflux disease)   . Hx of colonic polyps   . Hyperlipidemia   . Hypertension   . Lipoma   . Low back pain   . Mental disorder   . Obesity   . Pneumonia    hx  . RSD (reflex sympathetic dystrophy)   . Venous insufficiency     Current Outpatient Prescriptions  Medication Sig Dispense Refill  . albuterol (VENTOLIN HFA) 108 (90 Base) MCG/ACT inhaler Inhale 2 puffs into the lungs every 4 (four) hours as needed for wheezing or shortness of breath. 1 Inhaler 0  . aspirin 81 MG chewable tablet Chew 81  mg by mouth at bedtime.    [name] atorvastatin (LIPITOR) 80 MG tablet Take 40 mg by mouth daily.    . carvedilol (COREG) 6.25 MG tablet Take 1.5 tablets (9.375 mg total) by mouth 2 (two) times daily with a meal. 90 tablet 6  . celecoxib (CELEBREX) 200 MG capsule Take 200 mg by mouth as needed. headache    . Cholecalciferol (VITAMIN D-3) 5000 UNITS TABS Take 5,000 Units by mouth 2 (two) times daily.    . cyclobenzaprine (FLEXERIL) 5 MG tablet Take 1 tablet (5 mg total) by mouth 3 (three) times daily as needed for muscle spasms. Muscle spasm.    . DULoxetine (CYMBALTA) 60 MG capsule Take 60 mg by mouth daily.     . furosemide (LASIX) 40 MG  tablet Take 1 tablet (40 mg total) by mouth daily. 30 tablet 0  . glimepiride (AMARYL) 4 MG tablet TAKE 1 TABLET TWICE DAILY WITH MEALS. DO NOT TAKE IF YOU DO NOT EAT. 180 tablet 1  . lidocaine (LIDODERM) 5 % Place 1 patch onto the skin daily as needed (pain).    . metFORMIN (GLUCOPHAGE XR) 500 MG 24 hr tablet Take 2 tablets 2 x / day for Diabetes 360 tablet 1  . methotrexate (RHEUMATREX) 2.5 MG tablet Take 15 mg by mouth every Friday. 6 tablets  1  . metoCLOPramide (REGLAN) 10 MG tablet Take 10 mg by mouth 2 (two) times daily before a meal.    . pantoprazole (PROTONIX) 40 MG tablet Take 1 tablet (40 mg total) by mouth 2 (two) times daily before a meal. 180 tablet 3  . pregabalin (LYRICA) 150 MG capsule Take 150 mg by mouth 2 (two) times daily.    [name] PRESCRIPTION MEDICATION by Intrathecal route continuous. Fentanyl 2mg /ml solution . Daily dose 0.8820 mg Compounded at [facility] [phone] Pump is refilled monthly    . ranitidine (ZANTAC) 300 MG tablet TAKE 1 TABLET BY MOUTH EVERYDAY AT BEDTIME 90 tablet 1  . sacubitril-valsartan (ENTRESTO) 24-26 MG Take 1 tablet by mouth 2 (two) times daily. 60 tablet 5  . spironolactone (ALDACTONE) 25 MG tablet Take 0.5 tablets (12.5 mg total) by mouth at bedtime. 45 tablet 3  . tamsulosin (FLOMAX) 0.4 MG CAPS capsule TAKE ONE CAPSULE BY MOUTH EVERY DAY AFTER SUPPER 90 capsule 1  . Tapentadol HCl (NUCYNTA) 100 MG TABS Take 100 mg by mouth See admin instructions. Take 1 tablet (100 mg)n by mouth twice daily, may also take 1 tablet during the day as needed for pain    . traMADol (ULTRAM) 50 MG tablet Take 50 mg by mouth every 6 (six) hours as needed. pain    . folic acid (FOLVITE) 1 MG tablet Take 1 mg by mouth daily.  1   No current facility-administered medications for this encounter.     Allergies  Allergen Reactions  . Codeine Itching  . Morphine And Related Other (See Comments)    hallucinations      Social History    Social History  . Marital status: Married    Spouse name: karen  . Number of children: 2  . Years of education: N/A   Occupational History  . retired Management consultant    Social History Main Topics  . Smoking status: Former Smoker    Packs/day: 3.00    Years: 40.00    Types: Cigarettes    Quit date: 05/25/2002  . Smokeless tobacco: Never Used  . Alcohol use No  .  Drug use: No  . Sexual activity: No   Other Topics Concern  . Not on file   Social History Narrative  . No narrative on file      Family History  Problem Relation Age of Onset  . Cancer Father   . Colon cancer Neg Hx   . Esophageal cancer Neg Hx   . Stomach cancer Neg Hx   . Rectal cancer Neg Hx     Vitals:   11/30/16 1456  BP: (!) 114/58  Pulse: 91  SpO2: 95%  Weight: 199 lb 4.8 oz (90.4 kg)   Wt Readings from Last 3 Encounters:  11/30/16 199 lb 4.8 oz (90.4 kg)  11/07/16 198 lb (89.8 kg)  10/06/16 190 lb 12.8 oz (86.5 kg)    PHYSICAL EXAM: General: Well appearing. No resp difficulty. Amublated into clinic using cane without difficulty.  HEENT: Normal Neck: Supple. JVP 5-6. Carotids 2+ bilat; no bruits. No thyromegaly or nodule noted. Cor: PMI nondisplaced. RRR, No M/G/R noted Lungs: CTAB, normal effort. Abdomen: Soft, non-tender, non-distended, no HSM. No bruits or masses. +BS  Extremities: No cyanosis, clubbing, rash, R and LLE no edema.  Neuro: Alert & orientedx3, cranial nerves grossly intact. moves all 4 extremities w/o difficulty. Affect pleasant   ASSESSMENT & PLAN:  1. Chronic systolic CHF with EF 67-67% - NYHA class II symptoms currently.  - Volume status stable on exam.  - Continue lasix 40 mg daily. Can take extra as needed.  - Drop in EF out of proportion to his CAD on cath as above.  ? If due to LBBB. If EF does not improve on GDMT, will need to consider CRT.  - Continue Entresto 24/26 mg BID. BMET today (just started less than 1 week go.) - Increase coreg 12.5 mg  BID.  - Continue spiro 12.5 mg qhs.  - Reinforced fluid restriction to < 2 L daily, sodium restriction to less than 2000 mg daily, and the importance of daily weights.   2. non-critical CAD - Denies chest pain. No change.   - Continue ASA and statin.  3. HTN - Meds as above.  4. Mixed HLD - Per Dr. Gwenlyn Found. No change.  5. DM2 - Per PCP 6. Chronic back pain with spinal stimulator and fentanyl pump.   - Spinal stimulator was aggravating his back. Much improved with removal.  7. LBBB - QRS 158 on recent EKG.  - If EF remains depressed, may benefit from CRT as above. No change.   Labs today. Increase coreg. RTC 6-8 weeks. Will plan for Echo later this year. If EF remains depressed will need ICD referral.   Shirley Friar, PA-C 11/30/16   Greater than 50% of the 25 minute visit was spent in counseling/coordination of care regarding disease state education, sliding scale diuretics, salt/fluid restriction, and medication reconciliation.

## 2016-12-01 ENCOUNTER — Other Ambulatory Visit: Payer: Self-pay | Admitting: Internal Medicine

## 2016-12-14 ENCOUNTER — Telehealth: Payer: Self-pay | Admitting: Internal Medicine

## 2016-12-14 NOTE — Telephone Encounter (Signed)
Left message for pt to call back  °

## 2016-12-14 NOTE — Telephone Encounter (Signed)
Wife called to reschedule pts EGD with dil, starting to have more swallowing issues. Pt scheduled for previsit 01/30/17@2pm , EGD with dil scheduled in the [facility] 02/14/17@2pm . Pts wife aware of appts.

## 2017-01-19 ENCOUNTER — Ambulatory Visit: Payer: Self-pay | Admitting: Physician Assistant

## 2017-01-23 ENCOUNTER — Telehealth: Payer: Self-pay

## 2017-01-23 NOTE — Telephone Encounter (Signed)
Given his interval cardiopulmonary problems, he should schedule an office appointment to see me to discuss any procedural work. Thanks

## 2017-01-23 NOTE — Telephone Encounter (Signed)
Dr Henrene Pastor (and Jenny Reichmann),  This pt's EF is too low to qualify for LEC.  How would you like to proceed? Thank you, Shonette Rhames/PV

## 2017-01-24 NOTE — Telephone Encounter (Signed)
Message was left for pt to schedule an OV with Dr Henrene Pastor. No response yet!

## 2017-01-25 ENCOUNTER — Ambulatory Visit: Payer: Self-pay | Admitting: Physician Assistant

## 2017-01-25 NOTE — Progress Notes (Deleted)
Assessment and Plan:   Continue diet and meds as discussed. Further disposition pending results of labs. Over 30 minutes of exam, counseling, chart review, and critical decision making was performed  HPI 71 y.o. male  presents for 3 month follow up on hypertension, cholesterol, prediabetes, and vitamin D deficiency.   His blood pressure has been controlled at home, today their BP is    He has a history of Systolic heart failure x Jan 2018, had normal cath, last echo 08/2016, denies {BlankSingle:19196::"dyspnea on exertion","orthopnea","paroxysmal nocturnal dyspnea","edema"}. Positive for {CARDIAC SYMPTOMS:12860}. Wt Readings from Last 3 Encounters:  11/30/16 199 lb 4.8 oz (90.4 kg)  11/07/16 198 lb (89.8 kg)  10/06/16 190 lb 12.8 oz (86.5 kg)    He does not workout, was going to [facility] 5 days a week and wants to start back up. He denies chest pain, shortness of breath, dizziness.  He is on cholesterol medication and denies myalgias. His cholesterol is at goal. The cholesterol last visit was:  Lab Results  Component Value Date   CHOL 136 09/29/2016   HDL 41 09/29/2016   LDLCALC 57 09/29/2016   LDLDIRECT 178.6 09/08/2009   TRIG 191 (H) 09/29/2016   CHOLHDL 3.3 09/29/2016    He has been working on diet and exercise for diabetes,  Lab Results  Component Value Date   GFRNONAA >60 11/30/2016   He is on bASA, he is not on ACE due to hypotension and denies paresthesia of the feet, polydipsia, polyuria and visual disturbances. Last A1C in the office was:  Lab Results  Component Value Date   HGBA1C 6.9 (H) 09/29/2016   Patient is on Vitamin D supplement.   Lab Results  Component Value Date   VD25OH 19 09/29/2016     He has lower back pain and RSD, follows with Dr. Mechele Dawley at [facility], has fentanyl pump, he walks with a cane and complains of pain. He also follows with Dr. Lenna Gilford for RA.  BMI is There is no height or weight on file to calculate BMI., he has not been working on  diet and exercise, admits to eating french fries and chocolate malt shakes.  Wt Readings from Last 3 Encounters:  11/30/16 199 lb 4.8 oz (90.4 kg)  11/07/16 198 lb (89.8 kg)  10/06/16 190 lb 12.8 oz (86.5 kg)    Current Medications:  Current Outpatient Prescriptions on File Prior to Visit  Medication Sig Dispense Refill  . albuterol (VENTOLIN HFA) 108 (90 Base) MCG/ACT inhaler Inhale 2 puffs into the lungs every 4 (four) hours as needed for wheezing or shortness of breath. 1 Inhaler 0  . aspirin 81 MG chewable tablet Chew 81 mg by mouth at bedtime.    [name] atorvastatin (LIPITOR) 80 MG tablet Take 40 mg by mouth daily.    . carvedilol (COREG) 12.5 MG tablet Take 1 tablet (12.5 mg total) by mouth 2 (two) times daily with a meal. 30 tablet 3  . celecoxib (CELEBREX) 200 MG capsule Take 200 mg by mouth as needed. headache    . Cholecalciferol (VITAMIN D-3) 5000 UNITS TABS Take 5,000 Units by mouth 2 (two) times daily.    . cyclobenzaprine (FLEXERIL) 5 MG tablet Take 1 tablet (5 mg total) by mouth 3 (three) times daily as needed for muscle spasms. Muscle spasm.    . DULoxetine (CYMBALTA) 60 MG capsule Take 60 mg by mouth daily.     . folic acid (FOLVITE) 1 MG tablet Take 1 mg by mouth daily.  1  . furosemide (LASIX) 40 MG tablet Take 1 tablet (40 mg total) by mouth daily. 30 tablet 0  . glimepiride (AMARYL) 4 MG tablet TAKE 1 TABLET TWICE DAILY WITH MEALS. DO NOT TAKE IF YOU DO NOT EAT. 180 tablet 1  . lidocaine (LIDODERM) 5 % Place 1 patch onto the skin daily as needed (pain).    . metFORMIN (GLUCOPHAGE XR) 500 MG 24 hr tablet Take 2 tablets 2 x / day for Diabetes 360 tablet 1  . methotrexate (RHEUMATREX) 2.5 MG tablet Take 15 mg by mouth every Friday. 6 tablets  1  . metoCLOPramide (REGLAN) 10 MG tablet Take 10 mg by mouth 2 (two) times daily before a meal.    . pantoprazole (PROTONIX) 40 MG tablet Take 1 tablet (40 mg total) by mouth 2 (two) times daily before a meal. 180 tablet 3  . pregabalin  (LYRICA) 150 MG capsule Take 150 mg by mouth 2 (two) times daily.    [name] PRESCRIPTION MEDICATION by Intrathecal route continuous. Fentanyl 2mg /ml solution . Daily dose 0.8820 mg Compounded at [facility] [phone] Pump is refilled monthly    . ranitidine (ZANTAC) 300 MG tablet TAKE 1 TABLET BY MOUTH EVERYDAY AT BEDTIME 90 tablet 1  . sacubitril-valsartan (ENTRESTO) 24-26 MG Take 1 tablet by mouth 2 (two) times daily. 60 tablet 5  . spironolactone (ALDACTONE) 25 MG tablet Take 0.5 tablets (12.5 mg total) by mouth at bedtime. 45 tablet 3  . tamsulosin (FLOMAX) 0.4 MG CAPS capsule TAKE ONE CAPSULE BY MOUTH EVERY DAY AFTER SUPPER 90 capsule 1  . Tapentadol HCl (NUCYNTA) 100 MG TABS Take 100 mg by mouth See admin instructions. Take 1 tablet (100 mg)n by mouth twice daily, may also take 1 tablet during the day as needed for pain    . traMADol (ULTRAM) 50 MG tablet Take 50 mg by mouth every 6 (six) hours as needed. pain     No current facility-administered medications on file prior to visit.    Medical History:  Past Medical History:  Diagnosis Date  . Allergy   . Anemia   . Arthritis   . Borderline hypertension   . CHF (congestive heart failure) ([place])   . Clotting disorder (HCC)    bleeds easily-no dx  . COPD (chronic obstructive pulmonary disease) ([place])    denies  . Depression    hx of   . Diabetes mellitus   . Diverticulosis of colon   . Full dentures   . GERD (gastroesophageal reflux disease)   . Hx of colonic polyps   . Hyperlipidemia   . Hypertension   . Lipoma   . Low back pain   . Mental disorder   . Obesity   . Pneumonia    hx  . RSD (reflex sympathetic dystrophy)   . Venous insufficiency    Allergies:  Allergies  Allergen Reactions  . Codeine Itching  . Morphine And Related Other (See Comments)    hallucinations     Review of Systems:  Review of Systems  Constitutional: Negative for chills, fever and malaise/fatigue.  HENT: Negative  for congestion, ear pain and sore throat.   Eyes: Negative.   Respiratory: Negative for cough, shortness of breath and wheezing.   Cardiovascular: Negative for chest pain, palpitations and leg swelling.  Gastrointestinal: Positive for constipation. Negative for abdominal pain, blood in stool, diarrhea, heartburn, melena, nausea and vomiting.  Genitourinary: Negative for dysuria, frequency and urgency.  Musculoskeletal: Positive for  back pain, joint pain, myalgias and neck pain. Negative for falls.  Skin: Negative.   Neurological: Negative for dizziness, sensory change and headaches.  Psychiatric/Behavioral: Negative for depression. The patient is not nervous/anxious.     Family history- Review and unchanged Social history- Review and unchanged Physical Exam: There were no vitals taken for this visit. Wt Readings from Last 3 Encounters:  11/30/16 199 lb 4.8 oz (90.4 kg)  11/07/16 198 lb (89.8 kg)  10/06/16 190 lb 12.8 oz (86.5 kg)   General Appearance: Well nourished, in no apparent distress. Eyes: PERRLA, EOMs, conjunctiva no swelling or erythema Sinuses: No Frontal/maxillary tenderness ENT/Mouth: Ext aud canals clear, TMs without erythema, bulging. No erythema, swelling, or exudate on post pharynx.  Tonsils not swollen or erythematous. Hearing normal.  Neck: Supple, thyroid normal.  Respiratory: Respiratory effort normal, BS equal bilaterally without rales, rhonchi, wheezing or stridor.  Cardio: RRR with no MRGs. Brisk peripheral pulses without edema.  Abdomen: Soft, + BS,  Non tender, no guarding, rebound, hernias, masses. Lymphatics: Non tender without lymphadenopathy.  Musculoskeletal: Full ROM, 4/5 strength, Shuffling gait and ambulates with cane Skin: Warm, dry without rashes, lesions, ecchymosis.  Neuro: Cranial nerves intact. Normal muscle tone, no cerebellar symptoms. Psych: Awake and oriented X 3, normal affect, Insight and Judgment appropriate.    Vicie Mutters,  PA-C 8:04 AM [facility]

## 2017-01-31 ENCOUNTER — Ambulatory Visit (INDEPENDENT_AMBULATORY_CARE_PROVIDER_SITE_OTHER): Payer: Medicare Other | Admitting: Physician Assistant

## 2017-01-31 ENCOUNTER — Encounter: Payer: Self-pay | Admitting: Physician Assistant

## 2017-01-31 VITALS — BP 116/74 | HR 87 | Temp 97.0°F | Resp 18 | Ht 67.0 in | Wt 196.8 lb

## 2017-01-31 DIAGNOSIS — E782 Mixed hyperlipidemia: Secondary | ICD-10-CM | POA: Diagnosis not present

## 2017-01-31 DIAGNOSIS — I5042 Chronic combined systolic (congestive) and diastolic (congestive) heart failure: Secondary | ICD-10-CM | POA: Diagnosis not present

## 2017-01-31 DIAGNOSIS — J449 Chronic obstructive pulmonary disease, unspecified: Secondary | ICD-10-CM | POA: Diagnosis not present

## 2017-01-31 DIAGNOSIS — N182 Chronic kidney disease, stage 2 (mild): Secondary | ICD-10-CM | POA: Diagnosis not present

## 2017-01-31 DIAGNOSIS — I10 Essential (primary) hypertension: Secondary | ICD-10-CM | POA: Diagnosis not present

## 2017-01-31 DIAGNOSIS — E1122 Type 2 diabetes mellitus with diabetic chronic kidney disease: Secondary | ICD-10-CM | POA: Diagnosis not present

## 2017-01-31 DIAGNOSIS — I700 Atherosclerosis of aorta: Secondary | ICD-10-CM | POA: Diagnosis not present

## 2017-01-31 DIAGNOSIS — I5022 Chronic systolic (congestive) heart failure: Secondary | ICD-10-CM | POA: Diagnosis not present

## 2017-01-31 DIAGNOSIS — E119 Type 2 diabetes mellitus without complications: Secondary | ICD-10-CM | POA: Diagnosis not present

## 2017-01-31 DIAGNOSIS — E1121 Type 2 diabetes mellitus with diabetic nephropathy: Secondary | ICD-10-CM | POA: Diagnosis not present

## 2017-01-31 DIAGNOSIS — Z79899 Other long term (current) drug therapy: Secondary | ICD-10-CM | POA: Diagnosis not present

## 2017-01-31 NOTE — Progress Notes (Signed)
Assessment and Plan:   Essential hypertension - continue medications, DASH diet, exercise and monitor at home. Call if greater than 130/80.  -     CBC with Differential/Platelet -     BASIC METABOLIC PANEL WITH GFR -     Hepatic function panel -     TSH  Hyperlipidemia, mixed -continue medications, check lipids, decrease fatty foods, increase activity.  -     Lipid panel  Type 2 diabetes mellitus with stage 2 chronic kidney disease, without long-term current use of insulin ([place]) Discussed general issues about diabetes pathophysiology and management., Educational material distributed., Suggested low cholesterol diet., Encouraged aerobic exercise., Discussed foot care., Reminded to get yearly retinal exam. -     Hemoglobin A1c  Chronic obstructive pulmonary disease, unspecified COPD type ([facility]) Monitor for symptoms, continue meds  Chronic combined systolic and diastolic HF (heart failure) (HCC) Follows cardio Continue to monitor weight daily  Chronic systolic congestive heart failure (HCC) Follows cardio Continue to monitor weight daily  Atherosclerosis of aorta (HCC) Control blood pressure, cholesterol, glucose, increase exercise.   Controlled type 2 diabetes mellitus with diabetic nephropathy, without long-term current use of insulin (HCC) -     Hemoglobin A1c Discussed general issues about diabetes pathophysiology and management., Educational material distributed., Suggested low cholesterol diet., Encouraged aerobic exercise., Discussed foot care., Reminded to get yearly retinal exam.  Non-insulin treated type 2 diabetes mellitus ([place]) Discussed general issues about diabetes pathophysiology and management., Educational material distributed., Suggested low cholesterol diet., Encouraged aerobic exercise., Discussed foot care., Reminded to get yearly retinal exam.  Mixed hyperlipidemia -continue medications, check lipids, decrease fatty foods, increase activity.   Medication  management -     Magnesium   Continue diet and meds as discussed. Further disposition pending results of labs. Over 30 minutes of exam, counseling, chart review, and critical decision making was performed  HPI 71 y.o. male  presents for 3 month follow up on hypertension, cholesterol, prediabetes, and vitamin D deficiency.   His blood pressure has been controlled at home, today their BP is BP: 116/74  He has a history of Systolic heart failure x Jan 2018, had normal cath, last echo 08/2016, denies dyspnea on exertion, orthopnea, paroxysmal nocturnal dyspnea and edema. Positive for none. Wt Readings from Last 3 Encounters:  01/31/17 196 lb 12.8 oz (89.3 kg)  11/30/16 199 lb 4.8 oz (90.4 kg)  11/07/16 198 lb (89.8 kg)    He does not workout, was going to [facility] 5 days a week and wants to start back up. He denies chest pain, shortness of breath, dizziness.  He is on cholesterol medication and denies myalgias. His cholesterol is at goal. The cholesterol last visit was:  Lab Results  Component Value Date   CHOL 136 09/29/2016   HDL 41 09/29/2016   LDLCALC 57 09/29/2016   LDLDIRECT 178.6 09/08/2009   TRIG 191 (H) 09/29/2016   CHOLHDL 3.3 09/29/2016    He has been working on diet and exercise for diabetes,  Lab Results  Component Value Date   GFRNONAA >60 11/30/2016   He is on bASA, he is not on ACE due to hypotension and denies paresthesia of the feet, polydipsia, polyuria and visual disturbances. Last A1C in the office was:  Lab Results  Component Value Date   HGBA1C 6.9 (H) 09/29/2016   Patient is on Vitamin D supplement.   Lab Results  Component Value Date   VD25OH 68 09/29/2016     He has lower back  pain and RSD, follows with Dr. Mechele Dawley at [facility], he is on less of the pain pills, and he has seen occupational therapy and now driving again.  He also follows with Dr. Lenna Gilford for RA.  BMI is Body mass index is 30.82 kg/m., he has not been working on diet and exercise.   Wt Readings from Last 3 Encounters:  01/31/17 196 lb 12.8 oz (89.3 kg)  11/30/16 199 lb 4.8 oz (90.4 kg)  11/07/16 198 lb (89.8 kg)    Current Medications:  Current Outpatient Prescriptions on File Prior to Visit  Medication Sig Dispense Refill  . albuterol (VENTOLIN HFA) 108 (90 Base) MCG/ACT inhaler Inhale 2 puffs into the lungs every 4 (four) hours as needed for wheezing or shortness of breath. 1 Inhaler 0  . aspirin 81 MG chewable tablet Chew 81 mg by mouth at bedtime.    [name] atorvastatin (LIPITOR) 80 MG tablet Take 40 mg by mouth daily.    . carvedilol (COREG) 12.5 MG tablet Take 1 tablet (12.5 mg total) by mouth 2 (two) times daily with a meal. 30 tablet 3  . celecoxib (CELEBREX) 200 MG capsule Take 200 mg by mouth as needed. headache    . Cholecalciferol (VITAMIN D-3) 5000 UNITS TABS Take 5,000 Units by mouth 2 (two) times daily.    . cyclobenzaprine (FLEXERIL) 5 MG tablet Take 1 tablet (5 mg total) by mouth 3 (three) times daily as needed for muscle spasms. Muscle spasm.    . DULoxetine (CYMBALTA) 60 MG capsule Take 60 mg by mouth daily.     . furosemide (LASIX) 40 MG tablet Take 1 tablet (40 mg total) by mouth daily. 30 tablet 0  . glimepiride (AMARYL) 4 MG tablet TAKE 1 TABLET TWICE DAILY WITH MEALS. DO NOT TAKE IF YOU DO NOT EAT. 180 tablet 1  . lidocaine (LIDODERM) 5 % Place 1 patch onto the skin daily as needed (pain).    . metFORMIN (GLUCOPHAGE XR) 500 MG 24 hr tablet Take 2 tablets 2 x / day for Diabetes 360 tablet 1  . methotrexate (RHEUMATREX) 2.5 MG tablet Take 15 mg by mouth every Friday. 6 tablets  1  . metoCLOPramide (REGLAN) 10 MG tablet Take 10 mg by mouth 2 (two) times daily before a meal.    . pantoprazole (PROTONIX) 40 MG tablet Take 1 tablet (40 mg total) by mouth 2 (two) times daily before a meal. 180 tablet 3  . pregabalin (LYRICA) 150 MG capsule Take 150 mg by mouth 2 (two) times daily.    [name] PRESCRIPTION MEDICATION by Intrathecal route continuous. Fentanyl  2mg /ml solution . Daily dose 0.8820 mg Compounded at [facility] [phone] Pump is refilled monthly    . ranitidine (ZANTAC) 300 MG tablet TAKE 1 TABLET BY MOUTH EVERYDAY AT BEDTIME 90 tablet 1  . sacubitril-valsartan (ENTRESTO) 24-26 MG Take 1 tablet by mouth 2 (two) times daily. 60 tablet 5  . spironolactone (ALDACTONE) 25 MG tablet Take 0.5 tablets (12.5 mg total) by mouth at bedtime. 45 tablet 3  . tamsulosin (FLOMAX) 0.4 MG CAPS capsule TAKE ONE CAPSULE BY MOUTH EVERY DAY AFTER SUPPER 90 capsule 1  . Tapentadol HCl (NUCYNTA) 100 MG TABS Take 100 mg by mouth See admin instructions. Take 1 tablet (100 mg)n by mouth twice daily, may also take 1 tablet during the day as needed for pain     No current facility-administered medications on file prior to visit.    Medical History:  Past Medical History:  Diagnosis Date  . Allergy   . Anemia   . Arthritis   . Borderline hypertension   . CHF (congestive heart failure) ([place])   . Clotting disorder (HCC)    bleeds easily-no dx  . COPD (chronic obstructive pulmonary disease) ([place])    denies  . Depression    hx of   . Diabetes mellitus   . Diverticulosis of colon   . Full dentures   . GERD (gastroesophageal reflux disease)   . Hx of colonic polyps   . Hyperlipidemia   . Hypertension   . Lipoma   . Low back pain   . Mental disorder   . Obesity   . Pneumonia    hx  . RSD (reflex sympathetic dystrophy)   . Venous insufficiency    Allergies:  Allergies  Allergen Reactions  . Codeine Itching  . Morphine And Related Other (See Comments)    hallucinations     Review of Systems:  Review of Systems  Constitutional: Negative for chills, fever and malaise/fatigue.  HENT: Negative for congestion, ear pain and sore throat.   Eyes: Negative.   Respiratory: Negative for cough, shortness of breath and wheezing.   Cardiovascular: Negative for chest pain, palpitations and leg swelling.  Gastrointestinal:  Positive for constipation. Negative for abdominal pain, blood in stool, diarrhea, heartburn, melena, nausea and vomiting.  Genitourinary: Negative for dysuria, frequency and urgency.  Musculoskeletal: Positive for back pain, joint pain, myalgias and neck pain. Negative for falls.  Skin: Negative.   Neurological: Negative for dizziness, sensory change and headaches.  Psychiatric/Behavioral: Negative for depression. The patient is not nervous/anxious.     Family history- Review and unchanged Social history- Review and unchanged Physical Exam: BP 116/74   Pulse 87   Temp (!) 97 F (36.1 C)   Resp 18   Ht 5\' 7"  (1.702 m)   Wt 196 lb 12.8 oz (89.3 kg)   SpO2 99%   BMI 30.82 kg/m  Wt Readings from Last 3 Encounters:  01/31/17 196 lb 12.8 oz (89.3 kg)  11/30/16 199 lb 4.8 oz (90.4 kg)  11/07/16 198 lb (89.8 kg)   General Appearance: Well nourished, in no apparent distress. Eyes: PERRLA, EOMs, conjunctiva no swelling or erythema Sinuses: No Frontal/maxillary tenderness ENT/Mouth: Ext aud canals clear, TMs without erythema, bulging. No erythema, swelling, or exudate on post pharynx.  Tonsils not swollen or erythematous. Hearing normal.  Neck: Supple, thyroid normal.  Respiratory: Respiratory effort normal, BS equal bilaterally without rales, rhonchi, wheezing or stridor.  Cardio: RRR with no MRGs. Brisk peripheral pulses without edema.  Abdomen: Soft, + BS,  Non tender, no guarding, rebound, hernias, masses. Lymphatics: Non tender without lymphadenopathy.  Musculoskeletal: Full ROM, 4/5 strength, Shuffling gait and ambulates with cane Skin: Warm, dry without rashes, lesions, ecchymosis.  Neuro: Cranial nerves intact. Normal muscle tone, no cerebellar symptoms. Psych: Awake and oriented X 3, normal affect, Insight and Judgment appropriate.    Vicie Mutters, PA-C 4:24 PM [facility]

## 2017-01-31 NOTE — Patient Instructions (Signed)
Your A1C is a measure of your sugar over the past 3 months and is not affected by what you have eaten over the past few days. Diabetes increases your chances of stroke and heart attack over 300 % and is the leading cause of blindness and kidney failure in the [place]. Please make sure you decrease bad carbs like white bread, white rice, potatoes, corn, soft drinks, pasta, cereals, refined sugars, sweet tea, dried fruits, and fruit juice. Good carbs are okay to eat in moderation like sweet potatoes, brown rice, whole grain pasta/bread, most fruit (except dried fruit) and you can eat as many veggies as you want.   Greater than 6.5 is considered diabetic. Between 6.4 and 5.7 is prediabetic If your A1C is less than 5.7 you are NOT diabetic.  Targets for Glucose Readings: Time of Check Target for patients WITHOUT Diabetes Target for DIABETICS  Before Meals Less than 100  less than 150  Two hours after meals Less than 200  Less than 250    If your morning sugar is always below 100 then the issue is with your sugar spiking after meals. Try to take your blood sugar approximately 2 hours after eating, this number should be less than 200. If it is not, think about the foods that you ate and better choices you can make.      Bad carbs also include fruit juice, alcohol, and sweet tea. These are empty calories that do not signal to your brain that you are full.   Please remember the good carbs are still carbs which convert into sugar. So please measure them out no more than 1/2-1 cup of rice, oatmeal, pasta, and beans  Veggies are however free foods! Pile them on.   Not all fruit is created equal. Please see the list below, the fruit at the bottom is higher in sugars than the fruit at the top. Please avoid all dried fruits.

## 2017-02-01 LAB — HEPATIC FUNCTION PANEL
AG RATIO: 1.5 (calc) (ref 1.0–2.5)
ALBUMIN MSPROF: 4.3 g/dL (ref 3.6–5.1)
ALT: 23 U/L (ref 9–46)
AST: 21 U/L (ref 10–35)
Alkaline phosphatase (APISO): 94 U/L (ref 40–115)
BILIRUBIN TOTAL: 0.5 mg/dL (ref 0.2–1.2)
Bilirubin, Direct: 0.1 mg/dL (ref 0.0–0.2)
GLOBULIN: 2.8 g/dL (ref 1.9–3.7)
Indirect Bilirubin: 0.4 mg/dL (calc) (ref 0.2–1.2)
Total Protein: 7.1 g/dL (ref 6.1–8.1)

## 2017-02-01 LAB — LIPID PANEL
CHOL/HDL RATIO: 3.7 (calc) (ref <5.0)
CHOLESTEROL: 149 mg/dL (ref <200)
HDL: 40 mg/dL — AB (ref >40)
LDL Cholesterol (Calc): 81 mg/dL (calc)
NON-HDL CHOLESTEROL (CALC): 109 mg/dL (ref <130)
TRIGLYCERIDES: 182 mg/dL — AB (ref <150)

## 2017-02-01 LAB — BASIC METABOLIC PANEL WITHOUT GFR
BUN: 11 mg/dL (ref 7–25)
CO2: 29 mmol/L (ref 20–32)
Calcium: 10.1 mg/dL (ref 8.6–10.3)
Chloride: 104 mmol/L (ref 98–110)
Creat: 0.84 mg/dL (ref 0.70–1.18)
GFR, Est African American: 103 mL/min/1.73m2
GFR, Est Non African American: 89 mL/min/1.73m2
Glucose, Bld: 119 mg/dL — ABNORMAL HIGH (ref 65–99)
Potassium: 5.6 mmol/L — ABNORMAL HIGH (ref 3.5–5.3)
Sodium: 140 mmol/L (ref 135–146)

## 2017-02-01 LAB — HEMOGLOBIN A1C
EAG (MMOL/L): 7.9 (calc)
Hgb A1c MFr Bld: 6.6 % of total Hgb — ABNORMAL HIGH (ref <5.7)
Mean Plasma Glucose: 143 (calc)

## 2017-02-01 LAB — CBC WITH DIFFERENTIAL/PLATELET
Basophils Absolute: 32 {cells}/uL (ref 0–200)
Basophils Relative: 0.4 %
Eosinophils Absolute: 248 {cells}/uL (ref 15–500)
Eosinophils Relative: 3.1 %
HCT: 35.2 % — ABNORMAL LOW (ref 38.5–50.0)
Hemoglobin: 11.8 g/dL — ABNORMAL LOW (ref 13.2–17.1)
Lymphs Abs: 2000 {cells}/uL (ref 850–3900)
MCH: 28.6 pg (ref 27.0–33.0)
MCHC: 33.5 g/dL (ref 32.0–36.0)
MCV: 85.4 fL (ref 80.0–100.0)
MPV: 10.2 fL (ref 7.5–12.5)
Monocytes Relative: 4.5 %
Neutro Abs: 5360 {cells}/uL (ref 1500–7800)
Neutrophils Relative %: 67 %
Platelets: 356 Thousand/uL (ref 140–400)
RBC: 4.12 Million/uL — ABNORMAL LOW (ref 4.20–5.80)
RDW: 15.2 % — ABNORMAL HIGH (ref 11.0–15.0)
Total Lymphocyte: 25 %
WBC mixed population: 360 {cells}/uL (ref 200–950)
WBC: 8 Thousand/uL (ref 3.8–10.8)

## 2017-02-01 LAB — MAGNESIUM: MAGNESIUM: 2.1 mg/dL (ref 1.5–2.5)

## 2017-02-01 LAB — TSH: TSH: 2.32 m[IU]/L (ref 0.40–4.50)

## 2017-02-02 ENCOUNTER — Other Ambulatory Visit: Payer: Self-pay | Admitting: Physician Assistant

## 2017-02-03 ENCOUNTER — Ambulatory Visit (HOSPITAL_COMMUNITY)
Admission: RE | Admit: 2017-02-03 | Discharge: 2017-02-03 | Disposition: A | Discharge status: Home / Self Care | Payer: Medicare Other | Source: Ambulatory Visit | Attending: Internal Medicine | Admitting: Internal Medicine

## 2017-02-03 VITALS — BP 102/56 | HR 89 | Wt 203.0 lb

## 2017-02-03 DIAGNOSIS — M549 Dorsalgia, unspecified: Secondary | ICD-10-CM | POA: Diagnosis not present

## 2017-02-03 DIAGNOSIS — Z87891 Personal history of nicotine dependence: Secondary | ICD-10-CM | POA: Insufficient documentation

## 2017-02-03 DIAGNOSIS — I5022 Chronic systolic (congestive) heart failure: Secondary | ICD-10-CM | POA: Diagnosis present

## 2017-02-03 DIAGNOSIS — Z7984 Long term (current) use of oral hypoglycemic drugs: Secondary | ICD-10-CM | POA: Insufficient documentation

## 2017-02-03 DIAGNOSIS — K5730 Diverticulosis of large intestine without perforation or abscess without bleeding: Secondary | ICD-10-CM | POA: Diagnosis not present

## 2017-02-03 DIAGNOSIS — I110 Hypertensive heart disease with heart failure: Secondary | ICD-10-CM | POA: Insufficient documentation

## 2017-02-03 DIAGNOSIS — E119 Type 2 diabetes mellitus without complications: Secondary | ICD-10-CM | POA: Diagnosis not present

## 2017-02-03 DIAGNOSIS — I428 Other cardiomyopathies: Secondary | ICD-10-CM | POA: Diagnosis not present

## 2017-02-03 DIAGNOSIS — F329 Major depressive disorder, single episode, unspecified: Secondary | ICD-10-CM | POA: Insufficient documentation

## 2017-02-03 DIAGNOSIS — I429 Cardiomyopathy, unspecified: Secondary | ICD-10-CM | POA: Insufficient documentation

## 2017-02-03 DIAGNOSIS — E669 Obesity, unspecified: Secondary | ICD-10-CM | POA: Insufficient documentation

## 2017-02-03 DIAGNOSIS — G8929 Other chronic pain: Secondary | ICD-10-CM | POA: Insufficient documentation

## 2017-02-03 DIAGNOSIS — I10 Essential (primary) hypertension: Secondary | ICD-10-CM

## 2017-02-03 DIAGNOSIS — I447 Left bundle-branch block, unspecified: Secondary | ICD-10-CM | POA: Diagnosis not present

## 2017-02-03 DIAGNOSIS — I2510 Atherosclerotic heart disease of native coronary artery without angina pectoris: Secondary | ICD-10-CM | POA: Insufficient documentation

## 2017-02-03 DIAGNOSIS — J449 Chronic obstructive pulmonary disease, unspecified: Secondary | ICD-10-CM | POA: Diagnosis not present

## 2017-02-03 DIAGNOSIS — I872 Venous insufficiency (chronic) (peripheral): Secondary | ICD-10-CM | POA: Diagnosis not present

## 2017-02-03 DIAGNOSIS — Z7982 Long term (current) use of aspirin: Secondary | ICD-10-CM | POA: Diagnosis not present

## 2017-02-03 DIAGNOSIS — Z9689 Presence of other specified functional implants: Secondary | ICD-10-CM | POA: Insufficient documentation

## 2017-02-03 DIAGNOSIS — E782 Mixed hyperlipidemia: Secondary | ICD-10-CM | POA: Diagnosis not present

## 2017-02-03 DIAGNOSIS — G9050 Complex regional pain syndrome I, unspecified: Secondary | ICD-10-CM | POA: Insufficient documentation

## 2017-02-03 LAB — BASIC METABOLIC PANEL
Anion gap: 8 (ref 5–15)
BUN: 11 mg/dL (ref 6–20)
CALCIUM: 9.4 mg/dL (ref 8.9–10.3)
CO2: 29 mmol/L (ref 22–32)
Chloride: 100 mmol/L — ABNORMAL LOW (ref 101–111)
Creatinine, Ser: 0.84 mg/dL (ref 0.61–1.24)
GFR calc Af Amer: >60 mL/min (ref >60)
GLUCOSE: 108 mg/dL — AB (ref 65–99)
Potassium: 4.3 mmol/L (ref 3.5–5.1)
Sodium: 137 mmol/L (ref 135–145)

## 2017-02-03 NOTE — Progress Notes (Signed)
Advanced Heart Failure Clinic Note   Primary Care: Dr. [name] Primary Cardiologist: Dr. Gwenlyn Found HF: New  HPI:  James Parsons is a 71 y.o. male with PMH of Chronic systolic CHF with EF 52-77%, non-critical CAD, HTN, mixed HLD, DM2, and chronic back pain with spinal stimulator and fentanyl pump.    Admitted 5/21 -> 09/01/16 with acute SOB. Diuresed with IV lasix and discharged on po lasix 40 mg daily (Increase from his chronic dose).  He initially desaturated with ambulation on RA, but ultimately did not require 02 with distances of up to 400 feet. Discharge weight 183 lbs  Returns today for HF follow up. Feeling well. Denies SOB, orthopnea, PND. Denies dizziness with changing positions. Weights at home 196-199 pounds. Drinking less than 2L a day, eating a low sodium foods. Wife is cooking his foods at home and cooking fresh foods. Taking all medications.   R/LHC 04/25/2016  There is moderate to severe left ventricular systolic dysfunction.  LV end diastolic pressure is normal.  The left ventricular ejection fraction is 25-35% by visual estimate.  Mid RCA lesion, 40 %stenosed.  Ost Ramus to Ramus lesion, 60 %stenosed.  Ost LAD to Prox LAD lesion, 30 %stenosed.  Prox LAD to Mid LAD lesion, 20 %stenosed.  Hemodynamic findings consistent with mild pulmonary hypertension.  LV end diastolic pressure is normal. RHC Procedural Findings: Hemodynamics (mmHg) RA mean 7 RV 35/4 (10) PA 33/17 (25) PCWP 16 AO 121/60 Cardiac Output (Fick) 5.35 Cardiac Index (Fick) 2.61   Echo 08/26/2016 with LVEF 25-30%, mild MR, mild LAE  Social: 2 sons, 4 grandchildren. Retired Engineer, structural. Former smoker with > 100 pack years of tobacco abuse.  Stopped smoking in early 2000s. Has been married for 47 years this year.   Past Medical History 1. Chronic systolic CHF with EF 82-42% - Down from 40-45% 04/2016 2. non-critical CAD - Out of proportion to his HF.   3. HTN - Well controlled.  4.  Mixed HLD - On atorvastatin. 5. DM2 - Per PCP 6. Chronic back pain with spinal stimulator and fentanyl pump.   - To have Spinal stimulator removed 09/2016 7. LBBB - QRS 158 EKG 09/08/16. If EF remains depressed, Will likely benefit from CRT-P/D.   Review of systems complete and found to be negative unless listed in HPI.    Past Medical History:  Diagnosis Date  . Allergy   . Anemia   . Arthritis   . Borderline hypertension   . CHF (congestive heart failure) ([place])   . Clotting disorder (HCC)    bleeds easily-no dx  . COPD (chronic obstructive pulmonary disease) ([place])    denies  . Depression    hx of   . Diabetes mellitus   . Diverticulosis of colon   . Full dentures   . GERD (gastroesophageal reflux disease)   . Hx of colonic polyps   . Hyperlipidemia   . Hypertension   . Lipoma   . Low back pain   . Mental disorder   . Obesity   . Pneumonia    hx  . RSD (reflex sympathetic dystrophy)   . Venous insufficiency     Current Outpatient Prescriptions  Medication Sig Dispense Refill  . albuterol (VENTOLIN HFA) 108 (90 Base) MCG/ACT inhaler Inhale 2 puffs into the lungs every 4 (four) hours as needed for wheezing or shortness of breath. 1 Inhaler 0  . aspirin 81 MG chewable tablet Chew 81 mg by mouth at bedtime.    [name]  atorvastatin (LIPITOR) 80 MG tablet Take 40 mg by mouth daily.    . carvedilol (COREG) 12.5 MG tablet Take 1 tablet (12.5 mg total) by mouth 2 (two) times daily with a meal. 30 tablet 3  . celecoxib (CELEBREX) 200 MG capsule Take 200 mg by mouth as needed. headache    . Cholecalciferol (VITAMIN D-3) 5000 UNITS TABS Take 5,000 Units by mouth 2 (two) times daily.    . cyclobenzaprine (FLEXERIL) 5 MG tablet Take 1 tablet (5 mg total) by mouth 3 (three) times daily as needed for muscle spasms. Muscle spasm.    . DULoxetine (CYMBALTA) 60 MG capsule Take 60 mg by mouth daily.     . furosemide (LASIX) 40 MG tablet Take 1 tablet (40 mg total) by mouth daily. 30 tablet 0    . glimepiride (AMARYL) 4 MG tablet TAKE 1 TABLET TWICE DAILY WITH MEALS. DO NOT TAKE IF YOU DO NOT EAT. 180 tablet 1  . lidocaine (LIDODERM) 5 % Place 1 patch onto the skin daily as needed (pain).    . metFORMIN (GLUCOPHAGE XR) 500 MG 24 hr tablet Take 2 tablets 2 x / day for Diabetes 360 tablet 1  . methotrexate (RHEUMATREX) 2.5 MG tablet Take 15 mg by mouth every Friday. 6 tablets  1  . metoCLOPramide (REGLAN) 10 MG tablet Take 10 mg by mouth 2 (two) times daily before a meal.    . pantoprazole (PROTONIX) 40 MG tablet Take 1 tablet (40 mg total) by mouth 2 (two) times daily before a meal. 180 tablet 3  . pregabalin (LYRICA) 150 MG capsule Take 150 mg by mouth 2 (two) times daily.    [name] PRESCRIPTION MEDICATION by Intrathecal route continuous. Fentanyl 2mg /ml solution . Daily dose 0.8820 mg Compounded at [facility] [phone] Pump is refilled monthly    . ranitidine (ZANTAC) 300 MG tablet TAKE 1 TABLET BY MOUTH EVERYDAY AT BEDTIME 90 tablet 1  . sacubitril-valsartan (ENTRESTO) 24-26 MG Take 1 tablet by mouth 2 (two) times daily. 60 tablet 5  . spironolactone (ALDACTONE) 25 MG tablet Take 0.5 tablets (12.5 mg total) by mouth at bedtime. 45 tablet 3  . tamsulosin (FLOMAX) 0.4 MG CAPS capsule TAKE ONE CAPSULE BY MOUTH EVERY DAY AFTER SUPPER 90 capsule 1  . Tapentadol HCl (NUCYNTA) 100 MG TABS Take 100 mg by mouth See admin instructions. Take 1 tablet (100 mg)n by mouth twice daily, may also take 1 tablet during the day as needed for pain     No current facility-administered medications for this encounter.     Allergies  Allergen Reactions  . Codeine Itching  . Morphine And Related Other (See Comments)    hallucinations      Social History   Social History  . Marital status: Married    Spouse name: karen  . Number of children: 2  . Years of education: N/A   Occupational History  . retired Management consultant    Social History Main Topics  . Smoking  status: Former Smoker    Packs/day: 3.00    Years: 40.00    Types: Cigarettes    Quit date: 05/25/2002  . Smokeless tobacco: Never Used  . Alcohol use No  . Drug use: No  . Sexual activity: No   Other Topics Concern  . Not on file   Social History Narrative  . No narrative on file      Family History  Problem Relation Age of Onset  .  Cancer Father   . Colon cancer Neg Hx   . Esophageal cancer Neg Hx   . Stomach cancer Neg Hx   . Rectal cancer Neg Hx     Vitals:   02/03/17 1119  BP: (!) 102/56  Pulse: 89  SpO2: 95%  Weight: 203 lb (92.1 kg)   Wt Readings from Last 3 Encounters:  02/03/17 203 lb (92.1 kg)  01/31/17 196 lb 12.8 oz (89.3 kg)  11/30/16 199 lb 4.8 oz (90.4 kg)    PHYSICAL EXAM: General: Well appearing. No resp difficulty. HEENT: Normal Neck: Supple. JVP 5-6. Carotids 2+ bilat; no bruits. No thyromegaly or nodule noted. Cor: PMI nondisplaced. RRR, No M/G/R noted Lungs: CTAB, normal effort. Abdomen: Obese,soft, non-tender, non-distended, no HSM. No bruits or masses. +BS  Extremities: No cyanosis, clubbing, or rash. R and LLE no edema.  Neuro: Alert & orientedx3, cranial nerves grossly intact. moves all 4 extremities w/o difficulty. Affect pleasant   ASSESSMENT & PLAN:  1. 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 Drop in EF felt to be out of proportion with his CAD. ? If related to LBBB, if EF does not improve on GDMT, will need to consider CRT.  - NYHA II - Volume stable on exam, continue lasix 40 mg daily.  - Continue Entresto 24/26mg  BID. Will not increase with soft BP.  - Continue Coreg 12.5 mg BID.  - Continue Spiro 12.5 mg hs.    2. Non-critical CAD - Denies chest pain, continue ASA and statin.   3. HTN - BP soft. Continue current regimen.    4. Mixed HLD - Continue atorvastatin 40 mg daily.  - Recent LDL < 100.   5. DM2 - Follows with PCP - Last A1c 6.6.   6. Chronic back pain with spinal stimulator and  fentanyl pump.   - Spinal stimulator was aggravating his back. Much improved with removal.  - Follows with pain management.   7. LBBB - QRS 158 on recent EKG.  - If EF remains depressed, may benefit from CRT as above. No change.  - No change.   He needs a follow up Echo to see about ICD referral. Will have him follow up in 2 months and see an MD, he has not seen an MD yet.   Arbutus Leas, NP 02/03/17

## 2017-02-03 NOTE — Patient Instructions (Signed)
Routine lab work today. Will notify you of abnormal results, otherwise no news is good news!  Follow up 2 months with echocardiogram.  Take all medication as prescribed the day of your appointment. Bring all medications with you to your appointment.  Do the following things EVERYDAY: 1) Weigh yourself in the morning before breakfast. Write it down and keep it in a log. 2) Take your medicines as prescribed 3) Eat low salt foods-Limit salt (sodium) to 2000 mg per day.  4) Stay as active as you can everyday 5) Limit all fluids for the day to less than 2 liters

## 2017-02-14 ENCOUNTER — Encounter: Payer: Medicare Other | Admitting: Internal Medicine

## 2017-03-04 ENCOUNTER — Other Ambulatory Visit: Payer: Self-pay | Admitting: Physician Assistant

## 2017-03-04 DIAGNOSIS — R1319 Other dysphagia: Secondary | ICD-10-CM

## 2017-03-04 DIAGNOSIS — K219 Gastro-esophageal reflux disease without esophagitis: Secondary | ICD-10-CM

## 2017-03-19 ENCOUNTER — Other Ambulatory Visit (HOSPITAL_COMMUNITY): Payer: Self-pay | Admitting: Cardiology

## 2017-03-22 ENCOUNTER — Ambulatory Visit: Payer: Medicare Other | Admitting: Internal Medicine

## 2017-03-25 ENCOUNTER — Other Ambulatory Visit: Payer: Self-pay | Admitting: Internal Medicine

## 2017-03-30 ENCOUNTER — Ambulatory Visit (HOSPITAL_COMMUNITY)
Admission: RE | Admit: 2017-03-30 | Discharge: 2017-03-30 | Disposition: A | Discharge status: Home / Self Care | Payer: Medicare Other | Source: Ambulatory Visit | Attending: Cardiology | Admitting: Cardiology

## 2017-03-30 ENCOUNTER — Ambulatory Visit (HOSPITAL_BASED_OUTPATIENT_CLINIC_OR_DEPARTMENT_OTHER)
Admission: RE | Admit: 2017-03-30 | Discharge: 2017-03-30 | Disposition: A | Discharge status: Home / Self Care | Payer: Medicare Other | Source: Ambulatory Visit | Attending: Cardiology | Admitting: Cardiology

## 2017-03-30 VITALS — BP 128/56 | HR 82 | Wt 183.8 lb

## 2017-03-30 DIAGNOSIS — I110 Hypertensive heart disease with heart failure: Secondary | ICD-10-CM | POA: Insufficient documentation

## 2017-03-30 DIAGNOSIS — Z7984 Long term (current) use of oral hypoglycemic drugs: Secondary | ICD-10-CM | POA: Diagnosis not present

## 2017-03-30 DIAGNOSIS — Z9689 Presence of other specified functional implants: Secondary | ICD-10-CM | POA: Insufficient documentation

## 2017-03-30 DIAGNOSIS — G8929 Other chronic pain: Secondary | ICD-10-CM | POA: Insufficient documentation

## 2017-03-30 DIAGNOSIS — I447 Left bundle-branch block, unspecified: Secondary | ICD-10-CM | POA: Diagnosis not present

## 2017-03-30 DIAGNOSIS — E119 Type 2 diabetes mellitus without complications: Secondary | ICD-10-CM | POA: Diagnosis not present

## 2017-03-30 DIAGNOSIS — I5022 Chronic systolic (congestive) heart failure: Secondary | ICD-10-CM

## 2017-03-30 DIAGNOSIS — I429 Cardiomyopathy, unspecified: Secondary | ICD-10-CM | POA: Diagnosis not present

## 2017-03-30 DIAGNOSIS — Z7982 Long term (current) use of aspirin: Secondary | ICD-10-CM | POA: Insufficient documentation

## 2017-03-30 DIAGNOSIS — I2510 Atherosclerotic heart disease of native coronary artery without angina pectoris: Secondary | ICD-10-CM

## 2017-03-30 DIAGNOSIS — Z79899 Other long term (current) drug therapy: Secondary | ICD-10-CM | POA: Insufficient documentation

## 2017-03-30 DIAGNOSIS — E782 Mixed hyperlipidemia: Secondary | ICD-10-CM | POA: Diagnosis not present

## 2017-03-30 DIAGNOSIS — Z87891 Personal history of nicotine dependence: Secondary | ICD-10-CM | POA: Insufficient documentation

## 2017-03-30 DIAGNOSIS — M549 Dorsalgia, unspecified: Secondary | ICD-10-CM | POA: Diagnosis not present

## 2017-03-30 LAB — BASIC METABOLIC PANEL
Anion gap: 7 (ref 5–15)
BUN: 6 mg/dL (ref 6–20)
CO2: 26 mmol/L (ref 22–32)
CREATININE: 0.75 mg/dL (ref 0.61–1.24)
Calcium: 9.1 mg/dL (ref 8.9–10.3)
Chloride: 104 mmol/L (ref 101–111)
Glucose, Bld: 173 mg/dL — ABNORMAL HIGH (ref 65–99)
Potassium: 4.1 mmol/L (ref 3.5–5.1)
SODIUM: 137 mmol/L (ref 135–145)

## 2017-03-30 MED ORDER — SACUBITRIL-VALSARTAN 49-51 MG PO TABS: 1.0000 | ORAL_TABLET | Freq: Two times a day (BID) | ORAL | 3 refills | Status: DC

## 2017-03-30 NOTE — Patient Instructions (Signed)
Labs today (will call for abnormal results, otherwise no news is good news)  INCREASE Entresto to 49-51 mg (1 Tablet) Twice Daily  Follow up with Doroteo Bradford our pharmacist in 3 weeks  Follow up with Dr. Aundra Dubin in 6 weeks.

## 2017-03-31 LAB — PROTEIN ELECTROPHORESIS, SERUM
A/G RATIO SPE: 1.1 (ref 0.7–1.7)
ALBUMIN ELP: 3.4 g/dL (ref 2.9–4.4)
ALPHA-1-GLOBULIN: 0.2 g/dL (ref 0.0–0.4)
Alpha-2-Globulin: 0.9 g/dL (ref 0.4–1.0)
BETA GLOBULIN: 0.9 g/dL (ref 0.7–1.3)
GAMMA GLOBULIN: 0.9 g/dL (ref 0.4–1.8)
GLOBULIN, TOTAL: 3 g/dL (ref 2.2–3.9)
TOTAL PROTEIN ELP: 6.4 g/dL (ref 6.0–8.5)

## 2017-03-31 NOTE — Progress Notes (Signed)
Advanced Heart Failure Clinic Note   Primary Care: Dr. [name] Primary Cardiologist: Dr. Gwenlyn Found HF Cardiology: Dr. Aundra Dubin  HPI:  James Parsons is a 71 y.o. 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    Admitted 5/21 -> 09/01/16 with acute SOB. Diuresed with IV lasix and discharged on po lasix 40 mg daily (Increase from his chronic dose).  He initially desaturated with ambulation on RA, but ultimately did not require 02 with distances of up to 400 feet. Discharge weight 183 lbs  Returns today for followup of CHF.  He has been doing well from cardiac standpoint, still primarily limited by low back pain.  No signficant exertional dyspnea but walks with a cane because of his back.  No chest pain.  No orthopnea/PND.  No lightheadedness. Echo was done today and reviewed, EF 35%.   Labs (10/18): K 4.3, creatinine 0.84, LDL 81, HDL 40  Social History: 2 sons, 4 grandchildren. Retired Engineer, structural. Former smoker with > 100 pack years of tobacco abuse.  Stopped smoking in early 2000s. Has been married for 47 years this year.   Past Medical History 1. Chronic systolic CHF: Nonischemic cardiomyopathy.  Possible LBBB cardiomyopathy or viral myocarditis.  - LHC/RHC (1/18): Nonobstructive CAD.  Mean RA 7, PA 33/17, mean PCWP 16, CI 2.61.  - Echo (5/18): EF 25-30% - Echo (12/18): EF 35%, septal-lateral dyssynchrony with septal hypokinesis, normal RV size and systolic function, PASP 42 mmHg.  2. 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    3. HTN 4. Hyperlipidemia 5. DM2 6. Chronic back pain with fentanyl pump.   7. LBBB: Chronic.    Review of systems complete and found to be negative unless listed in HPI.    Family History  Problem Relation Age of Onset  . Cancer Father   . Colon cancer Neg Hx   . Esophageal cancer Neg Hx   . Stomach cancer Neg Hx   . Rectal cancer Neg Hx      Current Outpatient Medications  Medication Sig Dispense Refill  . albuterol (VENTOLIN HFA) 108 (90 Base) MCG/ACT inhaler Inhale 2 puffs into the lungs every 4 (four) hours as needed for wheezing or shortness of breath. 1 Inhaler 0  . aspirin 81 MG chewable tablet Chew 81 mg by mouth at bedtime.    [name] atorvastatin (LIPITOR) 80 MG tablet TAKE 1/2 TO 1 TABLET DAILY FOR CHOLESTEROL AS DIRECTED 90 tablet 1  . carvedilol (COREG) 12.5 MG tablet TAKE 1 TABLET BY MOUTH TWICE A DAY WITH A MEAL 30 tablet 3  . celecoxib (CELEBREX) 200 MG capsule Take 200 mg by mouth as needed. headache    . Cholecalciferol (VITAMIN D-3) 5000 UNITS TABS Take 5,000 Units by mouth 2 (two) times daily.    . cyclobenzaprine (FLEXERIL) 5 MG tablet Take 1 tablet (5 mg total) by mouth 3 (three) times daily as needed for muscle spasms. Muscle spasm.    . DULoxetine (CYMBALTA) 60 MG capsule Take 60 mg by mouth daily.     . furosemide (LASIX) 40 MG tablet Take 1 tablet (40 mg total) by mouth daily. 30 tablet 0  . glimepiride (AMARYL) 4 MG tablet TAKE 1 TABLET TWICE DAILY WITH MEALS. DO NOT TAKE IF YOU DO NOT EAT. 180 tablet 1  . lidocaine (LIDODERM) 5 % Place 1 patch onto the skin daily as needed (pain).    [name]  metFORMIN (GLUCOPHAGE XR) 500 MG 24 hr tablet Take 2 tablets 2 x / day for Diabetes 360 tablet 1  . methotrexate (RHEUMATREX) 2.5 MG tablet Take 15 mg by mouth every Friday. 6 tablets  1  . metoCLOPramide (REGLAN) 10 MG tablet TAKE 1 TABLET BY MOUTH 3 TIMES A DAY BEFORE MEALS 270 tablet 1  . pantoprazole (PROTONIX) 40 MG tablet Take 1 tablet (40 mg total) by mouth 2 (two) times daily before a meal. 180 tablet 3  . pregabalin (LYRICA) 150 MG capsule Take 150 mg by mouth 2 (two) times daily.    [name] PRESCRIPTION MEDICATION by Intrathecal route continuous. Fentanyl 2mg /ml solution . Daily dose 0.8820 mg Compounded at [facility] [phone] Pump is refilled monthly    . ranitidine (ZANTAC) 300 MG tablet  TAKE 1 TABLET BY MOUTH EVERYDAY AT BEDTIME 90 tablet 1  . spironolactone (ALDACTONE) 25 MG tablet Take 0.5 tablets (12.5 mg total) by mouth at bedtime. 45 tablet 3  . tamsulosin (FLOMAX) 0.4 MG CAPS capsule TAKE ONE CAPSULE BY MOUTH EVERY DAY AFTER SUPPER 90 capsule 1  . Tapentadol HCl (NUCYNTA) 100 MG TABS Take 100 mg by mouth See admin instructions. Take 1 tablet (100 mg)n by mouth twice daily, may also take 1 tablet during the day as needed for pain    . sacubitril-valsartan (ENTRESTO) 49-51 MG Take 1 tablet by mouth 2 (two) times daily. 60 tablet 3   No current facility-administered medications for this encounter.     Allergies  Allergen Reactions  . Codeine Itching  . Morphine And Related Other (See Comments)    hallucinations    Vitals:   03/30/17 1431  BP: (!) 128/56  Pulse: 82  SpO2: 98%  Weight: 183 lb 12.8 oz (83.4 kg)   Wt Readings from Last 3 Encounters:  03/30/17 183 lb 12.8 oz (83.4 kg)  02/03/17 203 lb (92.1 kg)  01/31/17 196 lb 12.8 oz (89.3 kg)    PHYSICAL EXAM: General: NAD Neck: No JVD, no thyromegaly or thyroid nodule.  Lungs: Clear to auscultation bilaterally with normal respiratory effort. CV: Nondisplaced PMI.  Heart regular S1/S2, no S3/S4, no murmur.  No peripheral edema.  No carotid bruit.  Normal pedal pulses.  Abdomen: Soft, nontender, no hepatosplenomegaly, no distention.  Skin: Intact without lesions or rashes.  Neurologic: Alert and oriented x 3.  Psych: Normal affect. Extremities: No clubbing or cyanosis.  HEENT: Normal.   ASSESSMENT & PLAN:  1. Chronic systolic CHF: Nonischemic cardiomyopathy. Possible LBBB CMP versus prior viral myocarditis. No family history of cardiomyopathy.  On exam today, he is not volume overloaded, NYHA class II symptoms.  Echo today was reviewed, EF 35% with septal-lateral dyssynchrony. - Volume stable on exam, continue lasix 40 mg daily.  - Increase Entresto to 49/51 bid today. BMET today and repeat in 10 days.    - Continue Coreg 12.5 mg BID.  - Continue Spiro 12.5 mg hs.  - I will check SPEP.  - EF borderline for CRT-D. I will arrange for cardiac MRI. If EF < 35%, would favor CRT-D given wide LBBB.   2. CAD: Nonobstructive.   - Continue ASA 81 and statin.   3. Chronic back pain Fentanyl pump.   - Follows with pain management.   Followup in 3 wks in pharmacy clinic for medication titration and see me in 6 wks after MRI is completed.   Loralie Champagne, MD 03/31/17

## 2017-04-19 ENCOUNTER — Ambulatory Visit (HOSPITAL_COMMUNITY)
Admission: RE | Admit: 2017-04-19 | Discharge: 2017-04-19 | Disposition: A | Discharge status: Home / Self Care | Payer: Medicare Other | Source: Ambulatory Visit | Attending: Cardiology | Admitting: Cardiology

## 2017-04-19 VITALS — BP 118/62 | HR 88 | Wt 200.5 lb

## 2017-04-19 DIAGNOSIS — I110 Hypertensive heart disease with heart failure: Secondary | ICD-10-CM | POA: Insufficient documentation

## 2017-04-19 DIAGNOSIS — E782 Mixed hyperlipidemia: Secondary | ICD-10-CM | POA: Insufficient documentation

## 2017-04-19 DIAGNOSIS — I447 Left bundle-branch block, unspecified: Secondary | ICD-10-CM | POA: Diagnosis not present

## 2017-04-19 DIAGNOSIS — I2510 Atherosclerotic heart disease of native coronary artery without angina pectoris: Secondary | ICD-10-CM | POA: Insufficient documentation

## 2017-04-19 DIAGNOSIS — I5022 Chronic systolic (congestive) heart failure: Secondary | ICD-10-CM | POA: Diagnosis present

## 2017-04-19 DIAGNOSIS — M549 Dorsalgia, unspecified: Secondary | ICD-10-CM | POA: Insufficient documentation

## 2017-04-19 DIAGNOSIS — G8929 Other chronic pain: Secondary | ICD-10-CM | POA: Insufficient documentation

## 2017-04-19 DIAGNOSIS — E119 Type 2 diabetes mellitus without complications: Secondary | ICD-10-CM | POA: Insufficient documentation

## 2017-04-19 DIAGNOSIS — I428 Other cardiomyopathies: Secondary | ICD-10-CM

## 2017-04-19 LAB — BASIC METABOLIC PANEL
Anion gap: 8 (ref 5–15)
BUN: 9 mg/dL (ref 6–20)
CALCIUM: 9.2 mg/dL (ref 8.9–10.3)
CO2: 25 mmol/L (ref 22–32)
Chloride: 102 mmol/L (ref 101–111)
Creatinine, Ser: 0.78 mg/dL (ref 0.61–1.24)
GFR calc Af Amer: >60 mL/min (ref >60)
GLUCOSE: 216 mg/dL — AB (ref 65–99)
Potassium: 4.1 mmol/L (ref 3.5–5.1)
Sodium: 135 mmol/L (ref 135–145)

## 2017-04-19 MED ORDER — SACUBITRIL-VALSARTAN 49-51 MG PO TABS: 1.0000 | ORAL_TABLET | Freq: Two times a day (BID) | ORAL | 3 refills | Status: DC

## 2017-04-19 MED ORDER — SPIRONOLACTONE 25 MG PO TABS: 25.0000 mg | ORAL_TABLET | Freq: Every day | ORAL | 3 refills | Status: DC

## 2017-04-19 NOTE — Progress Notes (Signed)
HF MD: Haroldine Laws  HPI:  James Demarais Smithis a 72 y.o.Caucasian malewith PMH of Chronic systolic CHF with EF 92-42%, non-critical CAD, HTN, mixed HLD, DM2, and chronic back pain with spinal stimulator and fentanyl pump.   Admitted 5/21 ->09/01/16 with acute SOB. Diuresed with IV lasix and discharged on po lasix 40 mg daily (Increase from his chronic dose). He initially desaturated with ambulation on RA, but ultimately did not require 02 with distances of up to 400 feet. Discharge weight 183 lbs  Mr Slatten presents today with his wife who manages his medications for pharmacist-led HF medication titration. At last HF clinic visit on 03/30/17, his Delene Loll was increased to 49-51 mg BID. He did state that he has actually been taking furosemide 20 mg daily instead of previously prescribed 40 mg daily for at least 2 months since they thought the 40 mg daily was "too much" for him because he was urinating more frequently. He has been feeling well since last visit and his weight has been stable at home 198-200 lb despite weight increase in clinic today. Denies SOB, edema, CP or dizziness. He did state that he drinks Gregory on a regular basis but drinks <2 L of fluid per day.     . Shortness of breath/dyspnea on exertion? no   Orthopnea/PND? Yes - stable on 2 pillows chronically . Edema? no . Lightheadedness/dizziness? no . Daily weights at home? Yes - stable 198-200 lb  . Blood pressure/heart rate monitoring at home? No - has BP cuff but not using  Following low-sodium/fluid-restricted diet? Yes - uses "no salt" salt substitute  HF Medications: Carvedilol 12.5 mg PO BID Furosemide 20 mg PO daily Entresto 49-51 mg PO BID Spironolactone 12.5 mg PO daily   Has the patient been experiencing any side effects to the medications prescribed?  no  Does the patient have any problems obtaining medications due to transportation or finances?   No - [employer] Medicare, Entresto $30/mo -- will sign up for  PANF for assistance with copays.Relayed info to [facility] who verified $0 copay.  Member ID: [id number] Group ID: [id number] RxBin ID: [id number] PCN: [id number] Eligibility Start Date: 01/19/2017 Eligibility End Date: 04/18/2018 Assistance Amount: $1,000.00   Understanding of regimen: good Understanding of indications: good Potential of compliance: good Patient understands to avoid NSAIDs. Patient understands to avoid decongestants.    Pertinent Lab Values: . 04/19/17: Serum creatinine 0.78, BUN 9, Potassium 4.1, Sodium 135  Vital Signs: . Weight: 200 lb (last HF clinic weight: 195-200 lb) . Blood pressure: 118/62 mmHg  . Heart rate: 88 bpm   Assessment: 1. Chronicsystolic CHF (EF 81-44>>81% on 03/30/17), due to ?HTN (CAD out of proportion to his HF). NYHA class II symptoms (limited 2/2 arthritis/walks with a cane).  - Volume status stable on exam despite weight increase  - Increase spironolactone to 25 mg daily   - Continue furosemide 20 mg daily (can take an additional 20 mg PRN for weight gain/SOB), carvedilol 12.5 mg BID (will verify dose when home), and Entresto 49-51 mg BID  - EF borderline for CRT-D. I will arrange for cardiac MRI. If EF < 35%, would favor CRT-D given wide LBBB.   - Basic disease state pathophysiology, medication indication, mechanism and side effects reviewed at length with patient and he verbalized understanding 2. Non-critical CAD - No chest pain. No change.  - Continue ASA and statin.  3. HTN - Well controlled - Meds as above 4. Mixed HLD - Per Dr. Gwenlyn Found. No  change.  5. DM2 - Per PCP 6. Chronic back pain with spinal stimulator and fentanyl pump.  - Spinal stimulator was aggravating his back. Much improved with removal.     Plan: 1) Medication changes: Based on clinical presentation, vital signs and recent labs will increase spironolactone to 25 mg daily 2) Labs: BMET today and at next visit 3) Follow-up: Dr. Aundra Dubin on 05/12/17   Ruta Hinds. Velva Harman,  PharmD, BCPS, CPP Clinical Pharmacist Pager: [phone] Phone: [phone] 04/19/2017 1:50 PM  Agree with increasing spiro. Follow BMET closely.   Glori Bickers, MD  10:55 PM

## 2017-04-19 NOTE — Patient Instructions (Signed)
It was great to see you today!  Please INCREASE your spironolactone to 25 mg (1 tablet) DAILY.   Blood work today. We will call you with any changes.   Please keep your appointment with Dr. Aundra Dubin on 05/12/17. Garage code [place].

## 2017-04-26 ENCOUNTER — Encounter: Payer: Self-pay | Admitting: Internal Medicine

## 2017-05-04 ENCOUNTER — Ambulatory Visit: Payer: Medicare Other | Admitting: Internal Medicine

## 2017-05-04 ENCOUNTER — Encounter: Payer: Self-pay | Admitting: Internal Medicine

## 2017-05-04 VITALS — BP 110/60 | HR 82 | Ht 67.0 in | Wt 196.0 lb

## 2017-05-04 DIAGNOSIS — R1310 Dysphagia, unspecified: Secondary | ICD-10-CM

## 2017-05-04 DIAGNOSIS — K222 Esophageal obstruction: Secondary | ICD-10-CM

## 2017-05-04 DIAGNOSIS — K219 Gastro-esophageal reflux disease without esophagitis: Secondary | ICD-10-CM | POA: Diagnosis not present

## 2017-05-04 NOTE — Patient Instructions (Signed)

## 2017-05-05 ENCOUNTER — Encounter: Payer: Self-pay | Admitting: Internal Medicine

## 2017-05-05 NOTE — Progress Notes (Signed)
HISTORY OF PRESENT ILLNESS:  James Parsons is a 72 y.o. male with MULTIPLE SIGNIFICANT medical problems as listed below who presents today regarding the need for follow-up therapeutic endoscopy for dysphagia. The patient last underwent upper endoscopy 07/28/2016 for significant dysphagia. He was found to have complex stricturing of the esophagus as manifested by multiple circumferential scarring or stenotic lesions over the course of the distal 7 cm of the esophagus with acute esophagitis proximal to this area. He was sequentially balloon dilated to a maximal diameter of 15.5 mm. Post dilation he was placed on pantoprazole 40 mg twice daily. It was recommended that he return for repeat dilation in about 4 weeks. However, he was hospitalized in May with congestive heart failure associated with respiratory compromise. Ejection fraction around that time of 25-35%. Subsequently has had his medical therapies adjusted. He has been doing better and is to the point that he is able to consider repeat endoscopy and dilation. He is accompanied today by his wife. He has been compliant with PPI therapy. They tell me that his swallowing has been about 75% better he still needs to be careful. No heartburn. He is on a number of oral agents for diabetes. No blood thinners. GI review of systems is otherwise negative. Screening colonoscopy 2014 was negative for neoplasia  REVIEW OF SYSTEMS:  All non-GI ROS negative was otherwise stated in the history of present illness except for arthritis, back pain  Past Medical History:  Diagnosis Date  . Allergy   . Anemia   . Arthritis   . Borderline hypertension   . CHF (congestive heart failure) ([place])   . Clotting disorder (HCC)    bleeds easily-no dx  . COPD (chronic obstructive pulmonary disease) ([place])    denies  . Depression    hx of   . Diabetes mellitus   . Diverticulosis of colon   . Full dentures   . GERD (gastroesophageal reflux disease)   . Hx of colonic  polyps   . Hyperlipidemia   . Hypertension   . Lipoma   . Low back pain   . Mental disorder   . Obesity   . Pneumonia    hx  . RSD (reflex sympathetic dystrophy)   . Venous insufficiency     Past Surgical History:  Procedure Laterality Date  . BACK SURGERY  2005,2007  . C3-4 anterior cervical discectomy and fusion with plating at c3-4  05/2006   Dr. Arnoldo Morale  . CARDIAC CATHETERIZATION N/A 04/25/2016   Procedure: Right/Left Heart Cath and Coronary Angiography;  Surgeon: Peter M Martinique, MD;  Location: [facility];  Service: Cardiovascular;  Laterality: N/A;  . COLONOSCOPY    . COLONOSCOPY W/ POLYPECTOMY    . excision of lipoma from right olecranon area  11/2000   Dr. Rise [name]  . KNEE ARTHROSCOPY     right knee  . KNEE ARTHROSCOPY Right 06/06/2014   Procedure: ARTHROSCOPY RIGHT KNEE WITH REMOVAL OF FIBROUS BANDS;  Surgeon: Kerin Salen, MD;  Location: [facility];  Service: Orthopedics;  Laterality: Right;  . LUMBAR LAMINECTOMY/DECOMPRESSION MICRODISCECTOMY N/A 01/08/2014   Procedure: L4-S1 Decompression with removal and reimplantation of spinal cord stimulator battery ;  Surgeon: Melina Schools, MD;  Location: [facility];  Service: Orthopedics;  Laterality: N/A;  . microdiscectomy and decompression  05/2002   Dr. Tonita Cong  . morphine pump  2009   due to reaction to morphine/changed to fentanyl  . PAIN PUMP IMPLANTATION     with  fentanyl  . spinal cord stimulator implanted     for pain per Dr. Maryruth Eve  . subcut pain pump implanted    . TONSILLECTOMY    . TOTAL KNEE ARTHROPLASTY  02/29/2012   Procedure: TOTAL KNEE ARTHROPLASTY;  Surgeon: Kerin Salen, MD;  Location: [facility];  Service: Orthopedics;  Laterality: Right;    Social History IZIK BINGMAN  reports that he quit smoking about 14 years ago. His smoking use included cigarettes. He has a 120.00 pack-year smoking history. he has never used smokeless tobacco. He reports that he does not drink alcohol or use  drugs.  family history includes Cancer in his father.  Allergies  Allergen Reactions  . Codeine Itching  . Morphine And Related Other (See Comments)    hallucinations       PHYSICAL EXAMINATION: Vital signs: BP 110/60   Pulse 82   Ht _0  (1.702 m)   Wt 196 lb (88.9 kg)   BMI 30.70 kg/m   Constitutional: generally well-appearing, no acute distress Psychiatric: alert and oriented x3, cooperative Eyes: extraocular movements intact, anicteric, conjunctiva pink Mouth: oral pharynx moist, no lesions Neck: supple no lymphadenopathy Cardiovascular: heart regular rate and rhythm, no murmur Lungs: clear to auscultation bilaterally Abdomen: soft, nontender, nondistended, no obvious ascites, no peritoneal signs, normal bowel sounds, no organomegaly Rectal: Omitted clubbing, cyanosis, or Extremities: no lower extremity edema bilaterally Skin: no lesions on visible extremities Neuro: No focal deficits. Cranial nerves intact  ASSESSMENT:  #1. 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 Status post balloon dilation to 15 mm April 2018. On twice a day PPI. Problem still persists with dysphagia though improved #2. Multiple significant medical problems that that this patient at high risk including congestive heart failure with low ejection fraction, diabetes mellitus, and COPD #3. Colonoscopy 2014 negative for neoplasia  PLAN:  #1. Upper endoscopy with esophageal dilation. The patient is HIGH RISK given the above listed comorbidities. Examination will need to be performed at the hospital with MAC.The nature of the procedure, as well as the risks, benefits, and alternatives were carefully and thoroughly reviewed with the patient. Ample time for discussion and questions allowed. The patient understood, was satisfied, and agreed to proceed. #2. Continue twice a day PPI #3. Hold oral diabetic agents the day of the examination to avoid and wanted  hypoglycemia

## 2017-05-11 ENCOUNTER — Ambulatory Visit: Payer: Medicare Other | Admitting: Internal Medicine

## 2017-05-11 VITALS — BP 108/66 | HR 76 | Temp 97.5°F | Resp 16 | Ht 67.5 in | Wt 197.8 lb

## 2017-05-11 DIAGNOSIS — I428 Other cardiomyopathies: Secondary | ICD-10-CM

## 2017-05-11 DIAGNOSIS — Z136 Encounter for screening for cardiovascular disorders: Secondary | ICD-10-CM

## 2017-05-11 DIAGNOSIS — G8929 Other chronic pain: Secondary | ICD-10-CM

## 2017-05-11 DIAGNOSIS — I10 Essential (primary) hypertension: Secondary | ICD-10-CM | POA: Diagnosis not present

## 2017-05-11 DIAGNOSIS — G894 Chronic pain syndrome: Secondary | ICD-10-CM

## 2017-05-11 DIAGNOSIS — Z1211 Encounter for screening for malignant neoplasm of colon: Secondary | ICD-10-CM

## 2017-05-11 DIAGNOSIS — M545 Low back pain: Secondary | ICD-10-CM

## 2017-05-11 DIAGNOSIS — Z87891 Personal history of nicotine dependence: Secondary | ICD-10-CM

## 2017-05-11 DIAGNOSIS — E1122 Type 2 diabetes mellitus with diabetic chronic kidney disease: Secondary | ICD-10-CM

## 2017-05-11 DIAGNOSIS — I5042 Chronic combined systolic (congestive) and diastolic (congestive) heart failure: Secondary | ICD-10-CM

## 2017-05-11 DIAGNOSIS — Z125 Encounter for screening for malignant neoplasm of prostate: Secondary | ICD-10-CM

## 2017-05-11 DIAGNOSIS — N401 Enlarged prostate with lower urinary tract symptoms: Secondary | ICD-10-CM

## 2017-05-11 DIAGNOSIS — Z0000 Encounter for general adult medical examination without abnormal findings: Secondary | ICD-10-CM

## 2017-05-11 DIAGNOSIS — Z1212 Encounter for screening for malignant neoplasm of rectum: Secondary | ICD-10-CM

## 2017-05-11 DIAGNOSIS — E559 Vitamin D deficiency, unspecified: Secondary | ICD-10-CM

## 2017-05-11 DIAGNOSIS — Z0001 Encounter for general adult medical examination with abnormal findings: Secondary | ICD-10-CM

## 2017-05-11 DIAGNOSIS — Z9689 Presence of other specified functional implants: Secondary | ICD-10-CM

## 2017-05-11 DIAGNOSIS — E782 Mixed hyperlipidemia: Secondary | ICD-10-CM

## 2017-05-11 DIAGNOSIS — N182 Chronic kidney disease, stage 2 (mild): Secondary | ICD-10-CM

## 2017-05-11 DIAGNOSIS — Z79899 Other long term (current) drug therapy: Secondary | ICD-10-CM

## 2017-05-11 DIAGNOSIS — I700 Atherosclerosis of aorta: Secondary | ICD-10-CM

## 2017-05-11 NOTE — Patient Instructions (Signed)

## 2017-05-12 ENCOUNTER — Encounter (HOSPITAL_COMMUNITY): Payer: Self-pay | Admitting: Cardiology

## 2017-05-12 ENCOUNTER — Encounter (HOSPITAL_COMMUNITY): Payer: Self-pay

## 2017-05-12 ENCOUNTER — Ambulatory Visit (HOSPITAL_COMMUNITY)
Admission: RE | Admit: 2017-05-12 | Discharge: 2017-05-12 | Disposition: A | Discharge status: Home / Self Care | Payer: Medicare Other | Source: Ambulatory Visit | Attending: Cardiology | Admitting: Cardiology

## 2017-05-12 ENCOUNTER — Other Ambulatory Visit: Payer: Self-pay

## 2017-05-12 VITALS — BP 98/56 | HR 80 | Wt 203.8 lb

## 2017-05-12 DIAGNOSIS — D509 Iron deficiency anemia, unspecified: Secondary | ICD-10-CM

## 2017-05-12 DIAGNOSIS — Z7984 Long term (current) use of oral hypoglycemic drugs: Secondary | ICD-10-CM | POA: Diagnosis not present

## 2017-05-12 DIAGNOSIS — Z7982 Long term (current) use of aspirin: Secondary | ICD-10-CM | POA: Insufficient documentation

## 2017-05-12 DIAGNOSIS — Z885 Allergy status to narcotic agent status: Secondary | ICD-10-CM | POA: Diagnosis not present

## 2017-05-12 DIAGNOSIS — E782 Mixed hyperlipidemia: Secondary | ICD-10-CM | POA: Insufficient documentation

## 2017-05-12 DIAGNOSIS — I5022 Chronic systolic (congestive) heart failure: Secondary | ICD-10-CM | POA: Diagnosis present

## 2017-05-12 DIAGNOSIS — G8929 Other chronic pain: Secondary | ICD-10-CM | POA: Insufficient documentation

## 2017-05-12 DIAGNOSIS — M545 Low back pain: Secondary | ICD-10-CM | POA: Insufficient documentation

## 2017-05-12 DIAGNOSIS — E119 Type 2 diabetes mellitus without complications: Secondary | ICD-10-CM | POA: Diagnosis not present

## 2017-05-12 DIAGNOSIS — I2510 Atherosclerotic heart disease of native coronary artery without angina pectoris: Secondary | ICD-10-CM | POA: Diagnosis not present

## 2017-05-12 DIAGNOSIS — D649 Anemia, unspecified: Secondary | ICD-10-CM

## 2017-05-12 DIAGNOSIS — Z87891 Personal history of nicotine dependence: Secondary | ICD-10-CM | POA: Diagnosis not present

## 2017-05-12 DIAGNOSIS — Z79899 Other long term (current) drug therapy: Secondary | ICD-10-CM | POA: Insufficient documentation

## 2017-05-12 DIAGNOSIS — I447 Left bundle-branch block, unspecified: Secondary | ICD-10-CM | POA: Insufficient documentation

## 2017-05-12 DIAGNOSIS — I428 Other cardiomyopathies: Secondary | ICD-10-CM

## 2017-05-12 DIAGNOSIS — I110 Hypertensive heart disease with heart failure: Secondary | ICD-10-CM | POA: Insufficient documentation

## 2017-05-12 LAB — MICROALBUMIN / CREATININE URINE RATIO
CREATININE, URINE: 50 mg/dL (ref 20–320)
MICROALB/CREAT RATIO: 4 ug/mg{creat} (ref <30)
Microalb, Ur: 0.2 mg/dL

## 2017-05-12 LAB — CBC WITH DIFFERENTIAL/PLATELET
BASOS PCT: 0.2 %
Basophils Absolute: 17 cells/uL (ref 0–200)
Eosinophils Absolute: 332 cells/uL (ref 15–500)
Eosinophils Relative: 4 %
HCT: 36.1 % — ABNORMAL LOW (ref 38.5–50.0)
HEMOGLOBIN: 12 g/dL — AB (ref 13.2–17.1)
LYMPHS ABS: 2988 {cells}/uL (ref 850–3900)
MCH: 28.3 pg (ref 27.0–33.0)
MCHC: 33.2 g/dL (ref 32.0–36.0)
MCV: 85.1 fL (ref 80.0–100.0)
MONOS PCT: 5.2 %
MPV: 10.2 fL (ref 7.5–12.5)
NEUTROS ABS: 4532 {cells}/uL (ref 1500–7800)
Neutrophils Relative %: 54.6 %
Platelets: 265 10*3/uL (ref 140–400)
RBC: 4.24 10*6/uL (ref 4.20–5.80)
RDW: 14.8 % (ref 11.0–15.0)
Total Lymphocyte: 36 %
WBC: 8.3 10*3/uL (ref 3.8–10.8)
WBCMIX: 432 {cells}/uL (ref 200–950)

## 2017-05-12 LAB — IRON AND TIBC
IRON: 49 ug/dL (ref 45–182)
SATURATION RATIOS: 16 % — AB (ref 17.9–39.5)
TIBC: 315 ug/dL (ref 250–450)
UIBC: 266 ug/dL

## 2017-05-12 LAB — BASIC METABOLIC PANEL WITH GFR
BUN: 11 mg/dL (ref 7–25)
CALCIUM: 10 mg/dL (ref 8.6–10.3)
CHLORIDE: 103 mmol/L (ref 98–110)
CO2: 27 mmol/L (ref 20–32)
Creat: 0.82 mg/dL (ref 0.70–1.18)
GFR, EST AFRICAN AMERICAN: 103 mL/min/{1.73_m2} (ref >=60)
GFR, EST NON AFRICAN AMERICAN: 89 mL/min/{1.73_m2} (ref >=60)
Glucose, Bld: 133 mg/dL — ABNORMAL HIGH (ref 65–99)
Potassium: 6 mmol/L — ABNORMAL HIGH (ref 3.5–5.3)
Sodium: 139 mmol/L (ref 135–146)

## 2017-05-12 LAB — HEPATIC FUNCTION PANEL
AG Ratio: 1.6 (calc) (ref 1.0–2.5)
ALBUMIN MSPROF: 4.4 g/dL (ref 3.6–5.1)
ALT: 25 U/L (ref 9–46)
AST: 20 U/L (ref 10–35)
Alkaline phosphatase (APISO): 83 U/L (ref 40–115)
BILIRUBIN DIRECT: 0.1 mg/dL (ref 0.0–0.2)
BILIRUBIN INDIRECT: 0.3 mg/dL (ref 0.2–1.2)
BILIRUBIN TOTAL: 0.4 mg/dL (ref 0.2–1.2)
GLOBULIN: 2.7 g/dL (ref 1.9–3.7)
Total Protein: 7.1 g/dL (ref 6.1–8.1)

## 2017-05-12 LAB — HEMOGLOBIN A1C
HEMOGLOBIN A1C: 7 %{Hb} — AB (ref <5.7)
MEAN PLASMA GLUCOSE: 154 (calc)
eAG (mmol/L): 8.5 (calc)

## 2017-05-12 LAB — FOLATE: Folate: 5.9 ng/mL — ABNORMAL LOW (ref >5.9)

## 2017-05-12 LAB — BASIC METABOLIC PANEL
ANION GAP: 10 (ref 5–15)
BUN: 15 mg/dL (ref 6–20)
CHLORIDE: 102 mmol/L (ref 101–111)
CO2: 23 mmol/L (ref 22–32)
Calcium: 8.8 mg/dL — ABNORMAL LOW (ref 8.9–10.3)
Creatinine, Ser: 1.18 mg/dL (ref 0.61–1.24)
GFR calc Af Amer: >60 mL/min (ref >60)
GLUCOSE: 149 mg/dL — AB (ref 65–99)
POTASSIUM: 4.7 mmol/L (ref 3.5–5.1)
Sodium: 135 mmol/L (ref 135–145)

## 2017-05-12 LAB — PSA: PSA: 0.3 ng/mL (ref <=4.0)

## 2017-05-12 LAB — URINALYSIS, ROUTINE W REFLEX MICROSCOPIC
Bilirubin Urine: NEGATIVE
GLUCOSE, UA: NEGATIVE
HGB URINE DIPSTICK: NEGATIVE
Ketones, ur: NEGATIVE
LEUKOCYTES UA: NEGATIVE
Nitrite: NEGATIVE
PROTEIN: NEGATIVE
Specific Gravity, Urine: 1.01 (ref 1.001–1.03)
pH: <=5 (ref 5.0–8.0)

## 2017-05-12 LAB — LIPID PANEL
CHOL/HDL RATIO: 3.4 (calc) (ref <5.0)
Cholesterol: 141 mg/dL (ref <200)
HDL: 41 mg/dL (ref >40)
LDL CHOLESTEROL (CALC): 76 mg/dL
NON-HDL CHOLESTEROL (CALC): 100 mg/dL (ref <130)
TRIGLYCERIDES: 137 mg/dL (ref <150)

## 2017-05-12 LAB — TSH: TSH: 3.48 m[IU]/L (ref 0.40–4.50)

## 2017-05-12 LAB — INSULIN, RANDOM: Insulin: 15.3 u[IU]/mL (ref 2.0–19.6)

## 2017-05-12 LAB — MAGNESIUM: Magnesium: 1.5 mg/dL (ref 1.5–2.5)

## 2017-05-12 LAB — VITAMIN B12: Vitamin B-12: 135 pg/mL — ABNORMAL LOW (ref 180–914)

## 2017-05-12 LAB — VITAMIN D 25 HYDROXY (VIT D DEFICIENCY, FRACTURES): Vit D, 25-Hydroxy: 57 ng/mL (ref 30–100)

## 2017-05-12 LAB — FERRITIN: FERRITIN: 44 ng/mL (ref 24–336)

## 2017-05-12 MED ORDER — LORAZEPAM 2 MG PO TABS: 2.0000 mg | ORAL_TABLET | Freq: Once | ORAL | 0 refills | Status: AC

## 2017-05-12 NOTE — Patient Instructions (Signed)
Labs drawn today (if we do not call you, then your lab work was stable)   Rx for compression stockings   Your physician has requested that you have a cardiac MRI. Cardiac MRI uses a computer to create images of your heart as its beating, producing both still and moving pictures of your heart and major blood vessels. For further information please visit [email or web address] Please follow the instruction sheet given to you today for more information.  prescription for Ativan 2 mg (1 tab) take 30 minutes before MRI  Your physician recommends that you schedule a follow-up appointment in: 3 months with Dr. Aundra Dubin

## 2017-05-12 NOTE — Progress Notes (Signed)
Research Encounter  Patient consented to IV iron study IRB # [id number]. Anemia panel drawn, quality of life survey completed, and 6-minute walk test administered. Follow-up on anemia panel and replete iron according to study protocol.   Ekansh Sherk, PharmD Pharmacy Resident Pager: [phone]  

## 2017-05-13 ENCOUNTER — Encounter: Payer: Self-pay | Admitting: Internal Medicine

## 2017-05-13 NOTE — Progress Notes (Signed)
[facility]   Unk Pinto, M.D.     Uvaldo Bristle. Silverio Lay, P.A.-C Liane Comber, [place]                [address] [place], [place] [address] Telephone [phone] Telefax [phone] Annual  Screening/Preventative Visit  & Comprehensive Evaluation & Examination     This very nice 72 y.o. MWM presents for a Screening/Preventative Visit & comprehensive evaluation and management of multiple medical co-morbidities.  Patient has been followed for HTN, T2_NIDDM  , Hyperlipidemia and Vitamin D Deficiency.      Patient also has a Chronic Pain Syndrome s/p HNP alleged due to a work related accident in 2010 and is on chronic Opioids with a Fentanyl pump since 2010. Last surgery was a lumbar fusion in 01/2015 by Dr Rolena Infante.  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 Patient is followed at the [facility]. He did have a spinal cord stimulator implanted in 2005, but no longer uses it. His last back surgery was in Oct 2016 for a Lumbar fusion.      HTN predates since 2010. Patient's BP has been controlled at home.  Today's BP is at goal - 108/66.  In Jan 2018,  he was hospitalized with Acute/chronic CHF and Heart cath  showed non-obstructive CAD. Patient is followed in the [facility].  Patient denies any cardiac symptoms as chest pain, palpitations, shortness of breath, dizziness or ankle swelling.     Patient's hyperlipidemia is controlled with diet and medications. Patient denies myalgias or other medication SE's. Last lipids were at goal: Lab Results  Component Value Date   CHOL 141 05/11/2017   HDL 41 05/11/2017   LDLCALC 57 09/29/2016   TRIG 137 05/11/2017   CHOLHDL 3.4 05/11/2017      Patient has T2_NIDDM since 2007 and patient denies reactive hypoglycemic symptoms, visual blurring, diabetic polys or paresthesias.  He reports CBG's range betw 150-170 mg &  . Last A1c was not at goal: Lab Results  Component Value Date   HGBA1C 7.0 (H) 05/11/2017       Finally, patient has history of Vitamin D Deficiency ("41"/2016 on Tx) and last vitamin D was near goal (70-100): Lab Results  Component Value Date   VD25OH 57 05/11/2017   Current Outpatient Medications on File Prior to Visit  Medication Sig  . albuterol (VENTOLIN HFA) 108 (90 Base) MCG/ACT inhaler Inhale 2 puffs into the lungs every 4 (four) hours as needed for wheezing or shortness of breath.  [name] aspirin 81 MG chewable tablet Chew 81 mg by mouth at bedtime.  [name] atorvastatin (LIPITOR) 80 MG tablet Take 40 mg by mouth daily.  . carvedilol (COREG) 12.5 MG tablet TAKE 1 TABLET BY MOUTH TWICE A DAY WITH A MEAL  . celecoxib (CELEBREX) 200 MG capsule Take 200 mg by mouth as needed. headache  . Cholecalciferol (VITAMIN D-3) 5000 UNITS TABS Take 5,000 Units by mouth 2 (two) times daily.  . cyclobenzaprine (FLEXERIL) 5 MG tablet Take 1 tablet (5 mg total) by mouth 3 (three) times daily as needed for muscle spasms. Muscle spasm.  . DULoxetine (CYMBALTA) 60 MG capsule Take 60 mg by mouth daily.   . furosemide (LASIX) 40 MG tablet Take 20 mg by mouth daily.  [name] glimepiride (AMARYL) 4 MG tablet TAKE 1 TABLET TWICE  DAILY WITH MEALS. DO NOT TAKE IF YOU DO NOT EAT.  [name] lidocaine (LIDODERM) 5 % Place 1 patch onto the skin daily as needed (pain).  . metFORMIN (GLUCOPHAGE XR) 500 MG 24 hr tablet Take 2 tablets 2 x / day for Diabetes  . methotrexate (RHEUMATREX) 2.5 MG tablet Take 15 mg by mouth every Friday. 6 tablets  . metoCLOPramide (REGLAN) 10 MG tablet TAKE 1 TABLET BY MOUTH 3 TIMES A DAY BEFORE MEALS  . pantoprazole (PROTONIX) 40 MG tablet Take 1 tablet (40 mg total) by mouth 2 (two) times daily before a meal.  . pregabalin (LYRICA) 150 MG capsule Take 150 mg by mouth 2 (two) times daily.  [name] PRESCRIPTION MEDICATION by Intrathecal route continuous. Fentanyl 2mg /ml solution . Daily dose 0.8820 mg Compounded at  [place] [phone] Pump is refilled monthly  . ranitidine (ZANTAC) 300 MG tablet TAKE 1 TABLET BY MOUTH EVERYDAY AT BEDTIME  . sacubitril-valsartan (ENTRESTO) 49-51 MG Take 1 tablet by mouth 2 (two) times daily.  [name] spironolactone (ALDACTONE) 25 MG tablet Take 1 tablet (25 mg total) by mouth at bedtime.  . tamsulosin (FLOMAX) 0.4 MG CAPS capsule TAKE ONE CAPSULE BY MOUTH EVERY DAY AFTER SUPPER  . Tapentadol HCl (NUCYNTA) 100 MG TABS Take 100 mg by mouth See admin instructions. Take 1 tablet (100 mg)n by mouth twice daily, may also take 1 tablet during the day as needed for pain   No current facility-administered medications on file prior to visit.    Allergies  Allergen Reactions  . Codeine Itching  . Morphine And Related Other (See Comments)    hallucinations   Past Medical History:  Diagnosis Date  . Allergy   . Anemia   . Arthritis   . Borderline hypertension   . CHF (congestive heart failure) ([place])   . Clotting disorder (HCC)    bleeds easily-no dx  . COPD (chronic obstructive pulmonary disease) ([place])    denies  . Depression    hx of   . Diabetes mellitus   . Diverticulosis of colon   . Full dentures   . GERD (gastroesophageal reflux disease)   . Hx of colonic polyps   . Hyperlipidemia   . Hypertension   . Lipoma   . Low back pain   . Mental disorder   . Obesity   . Pneumonia    hx  . RSD (reflex sympathetic dystrophy)   . Venous insufficiency    Health Maintenance  Topic Date Due  . Hepatitis C Screening  11-24-1945  . OPHTHALMOLOGY EXAM  08/09/2017  . HEMOGLOBIN A1C  11/08/2017  . FOOT EXAM  05/11/2018  . URINE MICROALBUMIN  05/11/2018  . COLONOSCOPY  01/25/2023  . TETANUS/TDAP  05/08/2023  . INFLUENZA VACCINE  Completed  . PNA vac Low Risk Adult  Completed   Immunization History  Administered Date(s) Administered  . Influenza Split 04/22/2011, 01/20/2012  . Influenza Whole 01/15/2007, 01/21/2009, 03/08/2010  . Influenza,  High Dose Seasonal PF 02/07/2013, 12/10/2015  . Influenza-Unspecified 12/10/2013, 01/10/2015, 12/08/2016  . Pneumococcal Conjugate-13 06/09/2015  . Pneumococcal Polysaccharide-23 04/22/2011  . Tdap 04/11/2001, 05/07/2013   Last Colon -  01/24/2013 - Dr Henrene Pastor recc 10 yr f/u 01/2023 Past Surgical History:  Procedure Laterality Date  . BACK SURGERY  2005,2007  . C3-4 anterior cervical discectomy and fusion with plating at c3-4  05/2006   Dr. Arnoldo Morale  . CARDIAC CATHETERIZATION N/A 04/25/2016   Procedure: Right/Left Heart Cath and Coronary Angiography;  Surgeon: Peter M Martinique, MD;  Location: [facility];  Service: Cardiovascular;  Laterality: N/A;  . COLONOSCOPY    . COLONOSCOPY W/ POLYPECTOMY    . excision of lipoma from right olecranon area  11/2000   Dr. Rise [name]  . KNEE ARTHROSCOPY     right knee  . KNEE ARTHROSCOPY Right 06/06/2014   Procedure: ARTHROSCOPY RIGHT KNEE WITH REMOVAL OF FIBROUS BANDS;  Surgeon: Kerin Salen, MD;  Location: [facility];  Service: Orthopedics;  Laterality: Right;  . LUMBAR LAMINECTOMY/DECOMPRESSION MICRODISCECTOMY N/A 01/08/2014   Procedure: L4-S1 Decompression with removal and reimplantation of spinal cord stimulator battery ;  Surgeon: Melina Schools, MD;  Location: [facility];  Service: Orthopedics;  Laterality: N/A;  . microdiscectomy and decompression  05/2002   Dr. Tonita Cong  . morphine pump  2009   due to reaction to morphine/changed to fentanyl  . PAIN PUMP IMPLANTATION     with fentanyl  . spinal cord stimulator implanted     for pain per Dr. Maryruth Eve  . subcut pain pump implanted    . TONSILLECTOMY    . TOTAL KNEE ARTHROPLASTY  02/29/2012   Procedure: TOTAL KNEE ARTHROPLASTY;  Surgeon: Kerin Salen, MD;  Location: [facility];  Service: Orthopedics;  Laterality: Right;   Family History  Problem Relation Age of Onset  . Cancer Father   . Colon cancer Neg Hx   . Esophageal cancer Neg Hx   . Stomach cancer Neg Hx   . Rectal cancer Neg Hx     Socioeconomic History  . Marital status: Married    Spouse name: karen  . Number of children: 2  Occupational History  . Occupation: retired Management consultant  Tobacco Use  . Smoking status: Former Smoker    Packs/day: 3.00    Years: 40.00    Pack years: 120.00    Types: Cigarettes    Last attempt to quit: 05/25/2002    Years since quitting: 14.9  . Smokeless tobacco: Never Used  Substance and Sexual Activity  . Alcohol use: No  . Drug use: No  . Sexual activity: No    ROS Constitutional: Denies fever, chills, weight loss/gain, headaches, insomnia,  night sweats or change in appetite. Does c/o fatigue. Eyes: Denies redness, blurred vision, diplopia, discharge, itchy or watery eyes.  ENT: Denies discharge, congestion, post nasal drip, epistaxis, sore throat, earache, hearing loss, dental pain, Tinnitus, Vertigo, Sinus pain or snoring.  Cardio: Denies chest pain, palpitations, irregular heartbeat, syncope, dyspnea, diaphoresis, orthopnea, PND, claudication or edema Respiratory: denies cough, dyspnea, DOE, pleurisy, hoarseness, laryngitis or wheezing.  Gastrointestinal: Denies dysphagia, heartburn, reflux, water brash, pain, cramps, nausea, vomiting, bloating, diarrhea, constipation, hematemesis, melena, hematochezia, jaundice or hemorrhoids Genitourinary: Denies dysuria, frequency, urgency, nocturia, hesitancy, discharge, hematuria or flank pain Musculoskeletal: Denies arthralgia, myalgia, stiffness, Jt. Swelling, pain, limp or strain/sprain. Denies Falls. Skin: Denies puritis, rash, hives, warts, acne, eczema or change in skin lesion Neuro: No weakness, tremor, incoordination, spasms, paresthesia or pain Psychiatric: Denies confusion, memory loss or sensory loss. Denies Depression. Endocrine: Denies change in weight, skin, hair change, nocturia, and paresthesia, diabetic polys, visual blurring or hyper / hypo glycemic episodes.  Heme/Lymph: No excessive bleeding, bruising or  enlarged lymph nodes.  Physical Exam  BP 108/66   Pulse 76   Temp (!) 97.5 F (36.4 C)   Resp 16   Ht 5' 7.5" (1.715 m)   Wt 197 lb 12.8 oz (89.7 kg)   BMI 30.52 kg/m  General Appearance: Well nourished and well groomed and in no apparent distress.  Eyes: PERRLA, EOMs, conjunctiva no swelling or erythema, normal fundi and vessels. Sinuses: No frontal/maxillary tenderness ENT/Mouth: EACs patent / TMs  nl. Nares clear without erythema, swelling, mucoid exudates. Oral hygiene is good. No erythema, swelling, or exudate. Tongue normal, non-obstructing. Tonsils not swollen or erythematous. Hearing normal.  Neck: Supple, thyroid normal. No bruits, nodes or JVD. Respiratory: Respiratory effort normal.  BS equal and clear bilateral without rales, rhonci, wheezing or stridor. Cardio: Heart sounds are normal with regular rate and rhythm and no murmurs, rubs or gallops. Peripheral pulses are normal and equal bilaterally without edema. No aortic or femoral bruits. Chest: symmetric with normal excursions and percussion.  Abdomen: Soft, with Nl bowel sounds. Nontender, no guarding, rebound, hernias, masses, or organomegaly.  Lymphatics: Non tender without lymphadenopathy.  Genitourinary: No hernias.Testes nl. DRE - prostate nl for age - smooth & firm w/o nodules. Musculoskeletal: Full ROM all peripheral extremities, joint stability, 5/5 strength, and normal gait. Skin: Warm and dry without rashes, lesions, cyanosis, clubbing or  ecchymosis.  Neuro: Cranial nerves intact, reflexes equal bilaterally. Normal muscle tone, no cerebellar symptoms. Sensation intact.  Pysch: Alert and oriented X 3 with normal affect, insight and judgment appropriate.   Assessment and Plan  1. Annual Preventative/Screening Exam   2. Essential hypertension  - EKG 12-Lead - [place], RETROPERITNL ABD,  LTD - Urinalysis, Routine w reflex microscopic - Microalbumin / creatinine urine ratio - CBC with Differential/Platelet -  BASIC METABOLIC PANEL WITH GFR - Magnesium - TSH  3. Hyperlipidemia, mixed  - EKG 12-Lead - [place], RETROPERITNL ABD,  LTD - Hepatic function panel - Lipid panel - TSH  4. 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 Vitamin D deficiency  - VITAMIN D 25 Hydroxy   6. Chronic combined systolic and diastolic HF (heart failure) (HCC)  - EKG 12-Lead  7. Non-ischemic cardiomyopathy ([place])  - EKG 12-Lead  8. Former smoker  - EKG 12-Lead - [place], RETROPERITNL ABD,  LTD  9. Screening for ischemic heart disease  - EKG 12-Lead  10. Screening for AAA (aortic abdominal aneurysm)  - [place], RETROPERITNL ABD,  LTD  11. Atherosclerosis of aorta (HCC)  - [place], RETROPERITNL ABD,  LTD  12. Chronic bilateral low back pain without sciatica  13. Chronic pain syndrome  14. Spinal cord stimulator status  15. Medication management  - Urinalysis, Routine w reflex microscopic - Microalbumin / creatinine urine ratio - CBC with Differential/Platelet - BASIC METABOLIC PANEL WITH GFR - Hepatic function panel - Magnesium - Lipid panel - TSH - Hemoglobin A1c - Insulin, random - VITAMIN D 25 Hydroxy   16. Screening for colorectal cancer  - POC Hemoccult Bld/Stl  17. Prostate cancer screening  - PSA  18. Benign localized prostatic hyperplasia with lower urinary tract symptoms (LUTS)  - PSA       Patient was counseled in prudent diet, weight control to achieve/maintain BMI less than 25, BP monitoring, regular exercise and medications as discussed.  Discussed med effects and SE's. Routine screening labs and tests as requested with regular follow-up as recommended. Over 40 minutes of exam, counseling, chart review and high complex critical  decision making was  performed

## 2017-05-14 NOTE — Progress Notes (Signed)
Advanced Heart Failure Clinic Note   Primary Care: Dr. [name] Primary Cardiologist: Dr. Gwenlyn Found HF Cardiology: Dr. Aundra Dubin  HPI:  James Parsons is a 72 y.o. male with PMH of chronic systolic CHF with EF 16-10% (NICM), non-critical CAD, HTN, mixed HLD, DM2, and chronic back pain with spinal stimulator and fentanyl pump.    Admitted 5/21 -> 09/01/16 with acute SOB. Diuresed with IV lasix and discharged on po lasix 40 mg daily (Increase from his chronic dose).  He initially desaturated with ambulation on RA, but ultimately did not require 02 with distances of up to 400 feet. Discharge weight 183 lbs.  Echo in 12/18 with EF 35%.   Returns today for followup of CHF.  Patient continues to have severe low back pain which limits his activity.  No significant dyspnea with the activity that he does (short distances walking).  No orthopnea/PND.  Occasional mild lightheadedness with standing.  Weight has been stable at home.  K was high at 6 when checked yesterday though creatinine normal.   Labs (10/18): K 4.3, creatinine 0.84, LDL 81, HDL 40 Labs (1/19): LDL 76, HDL 41, K ?6, creatinine 0.82, hgb 12  Social History: 2 sons, 4 grandchildren. Retired Engineer, structural. Former smoker with > 100 pack years of tobacco abuse.  Stopped smoking in early 2000s. Has been married for 47 years this year.   Past Medical History 1. Chronic systolic CHF: Nonischemic cardiomyopathy.  Possible LBBB cardiomyopathy or viral myocarditis.  - LHC/RHC (1/18): Nonobstructive CAD.  Mean RA 7, PA 33/17, mean PCWP 16, CI 2.61.  - Echo (5/18): EF 25-30% - Echo (12/18): EF 35%, septal-lateral dyssynchrony with septal hypokinesis, normal RV size and systolic function, PASP 42 mmHg.  2. 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    3. HTN 4. Hyperlipidemia 5. DM2 6. Chronic back pain with fentanyl pump.   7. LBBB: Chronic.    Review of systems complete and found to be negative unless listed in HPI.    Family History  Problem  Relation Age of Onset  . Cancer Father   . Colon cancer Neg Hx   . Esophageal cancer Neg Hx   . Stomach cancer Neg Hx   . Rectal cancer Neg Hx     Current Outpatient Medications  Medication Sig Dispense Refill  . albuterol (VENTOLIN HFA) 108 (90 Base) MCG/ACT inhaler Inhale 2 puffs into the lungs every 4 (four) hours as needed for wheezing or shortness of breath. 1 Inhaler 0  . aspirin 81 MG chewable tablet Chew 81 mg by mouth at bedtime.    [name] atorvastatin (LIPITOR) 80 MG tablet Take 40 mg by mouth daily.    . carvedilol (COREG) 12.5 MG tablet TAKE 1 TABLET BY MOUTH TWICE A DAY WITH A MEAL 30 tablet 3  . celecoxib (CELEBREX) 200 MG capsule Take 200 mg by mouth as needed. headache    . Cholecalciferol (VITAMIN D-3) 5000 UNITS TABS Take 5,000 Units by mouth 2 (two) times daily.    . cyclobenzaprine (FLEXERIL) 5 MG tablet Take 1 tablet (5 mg total) by mouth 3 (three) times daily as needed for muscle spasms. Muscle spasm.    . DULoxetine (CYMBALTA) 60 MG capsule Take 60 mg by mouth daily.     . furosemide (LASIX) 40 MG tablet Take 20 mg by mouth daily.    [name] glimepiride (AMARYL) 4 MG tablet TAKE 1 TABLET TWICE DAILY WITH MEALS. DO NOT TAKE IF YOU DO NOT EAT. 180 tablet 1  .  lidocaine (LIDODERM) 5 % Place 1 patch onto the skin daily as needed (pain).    . metFORMIN (GLUCOPHAGE XR) 500 MG 24 hr tablet Take 2 tablets 2 x / day for Diabetes 360 tablet 1  . methotrexate (RHEUMATREX) 2.5 MG tablet Take 15 mg by mouth every Friday. 6 tablets  1  . metoCLOPramide (REGLAN) 10 MG tablet TAKE 1 TABLET BY MOUTH 3 TIMES A DAY BEFORE MEALS 270 tablet 1  . pantoprazole (PROTONIX) 40 MG tablet Take 1 tablet (40 mg total) by mouth 2 (two) times daily before a meal. 180 tablet 3  . pregabalin (LYRICA) 150 MG capsule Take 150 mg by mouth 2 (two) times daily.    [name] PRESCRIPTION MEDICATION by Intrathecal route continuous. Fentanyl 2mg /ml solution . Daily dose 0.8820 mg Compounded at [place] [phone] Pump is refilled monthly    . ranitidine (ZANTAC) 300 MG tablet TAKE 1 TABLET BY MOUTH EVERYDAY AT BEDTIME 90 tablet 1  . sacubitril-valsartan (ENTRESTO) 49-51 MG Take 1 tablet by mouth 2 (two) times daily. 180 tablet 3  . spironolactone (ALDACTONE) 25 MG tablet Take 1 tablet (25 mg total) by mouth at bedtime. 90 tablet 3  . tamsulosin (FLOMAX) 0.4 MG CAPS capsule TAKE ONE CAPSULE BY MOUTH EVERY DAY AFTER SUPPER 90 capsule 1  . Tapentadol HCl (NUCYNTA) 100 MG TABS Take 100 mg by mouth See admin instructions. Take 1 tablet (100 mg)n by mouth twice daily, may also take 1 tablet during the day as needed for pain     No current facility-administered medications for this encounter.     Allergies  Allergen Reactions  . Codeine Itching  . Morphine And Related Other (See Comments)    hallucinations    Vitals:   05/12/17 1444  BP: (!) 98/56  Pulse: 80  SpO2: 100%  Weight: 203 lb 12 oz (92.4 kg)   Wt Readings from Last 3 Encounters:  05/12/17 203 lb 12 oz (92.4 kg)  05/11/17 197 lb 12.8 oz (89.7 kg)  05/04/17 196 lb (88.9 kg)    PHYSICAL EXAM: General: NAD Neck: No JVD, no thyromegaly or thyroid nodule.  Lungs: Clear to auscultation bilaterally with normal respiratory effort. CV: Nondisplaced PMI.  Heart regular S1/S2, no S3/S4, no murmur.  No peripheral edema.  No carotid bruit.  Normal pedal pulses.  Abdomen: Soft, nontender, no hepatosplenomegaly, no distention.  Skin: Intact without lesions or rashes.  Neurologic: Alert and oriented x 3.  Psych: Normal affect. Extremities: No clubbing or cyanosis.  HEENT: Normal.   ASSESSMENT & PLAN:  1. Chronic systolic CHF: Nonischemic cardiomyopathy. Possible LBBB CMP versus prior viral myocarditis. No family history of cardiomyopathy.  Echo in 12/18 with EF 35% with septal-lateral dyssynchrony.  On exam today, he is not volume overloaded, NYHA class II symptoms.  No BP room to titrate up HF meds.  - Continue lasix 20 mg  daily. BMET today ( I think BMET yesterday with K 6 was likely hemolyzed).  - Continue Entresto 49/51 bid today.    - Continue Coreg 12.5 mg BID.  - Continue Spiro 12.5 mg hs => suspect high K yesterday inaccurate, send BMET today.  - EF borderline for CRT-D. Setting up cardiac MRI. If EF < 35%, would favor CRT-D given wide LBBB.   - Will have him wear compression stockings given occasional orthostatic symptoms.  2. CAD: Nonobstructive.   - Continue ASA 81 and statin.   3. Chronic back pain Fentanyl pump.   -  Follows with pain management.   Followup in  3 months  Loralie Champagne, MD 05/14/17

## 2017-05-15 ENCOUNTER — Encounter (HOSPITAL_COMMUNITY): Payer: Self-pay

## 2017-05-15 DIAGNOSIS — D649 Anemia, unspecified: Secondary | ICD-10-CM

## 2017-05-15 NOTE — Progress Notes (Signed)
Research Encounter  Patient's anemia panel results do not meet inclusion criteria for IV iron study.    James Parsons, PharmD Pharmacy Resident Pager: [phone]

## 2017-05-17 ENCOUNTER — Telehealth (HOSPITAL_COMMUNITY): Payer: Self-pay | Admitting: *Deleted

## 2017-05-17 MED ORDER — FOLIC ACID 1 MG PO TABS: 1.0000 mg | ORAL_TABLET | Freq: Every day | ORAL | 3 refills | Status: DC

## 2017-05-17 NOTE — Telephone Encounter (Signed)
-----   Message from Larey Dresser, MD sent at 05/13/2017 10:30 PM EST ----- Low folate. Start folic acid 1 mg daily.  Low B12.  Needs B12 shots, should get through PCP.  Low transferrin saturation, would give a dose of IV Fe.

## 2017-05-18 ENCOUNTER — Other Ambulatory Visit (HOSPITAL_COMMUNITY): Payer: Self-pay

## 2017-05-18 DIAGNOSIS — D509 Iron deficiency anemia, unspecified: Secondary | ICD-10-CM

## 2017-05-22 ENCOUNTER — Other Ambulatory Visit (HOSPITAL_COMMUNITY): Payer: Self-pay | Admitting: Internal Medicine

## 2017-05-25 ENCOUNTER — Other Ambulatory Visit (HOSPITAL_COMMUNITY): Payer: Self-pay | Admitting: *Deleted

## 2017-05-25 DIAGNOSIS — I5022 Chronic systolic (congestive) heart failure: Secondary | ICD-10-CM

## 2017-05-26 ENCOUNTER — Encounter: Payer: Self-pay | Admitting: Cardiology

## 2017-05-27 ENCOUNTER — Other Ambulatory Visit (HOSPITAL_COMMUNITY): Payer: Self-pay | Admitting: Internal Medicine

## 2017-05-29 ENCOUNTER — Other Ambulatory Visit: Payer: Self-pay

## 2017-05-29 ENCOUNTER — Encounter (HOSPITAL_COMMUNITY): Payer: Self-pay

## 2017-06-02 ENCOUNTER — Other Ambulatory Visit: Payer: Self-pay | Admitting: Internal Medicine

## 2017-06-05 ENCOUNTER — Ambulatory Visit (HOSPITAL_COMMUNITY)
Admission: RE | Admit: 2017-06-05 | Discharge: 2017-06-05 | Disposition: A | Discharge status: Home / Self Care | Payer: Medicare Other | Source: Ambulatory Visit | Attending: Cardiology | Admitting: Cardiology

## 2017-06-05 DIAGNOSIS — D509 Iron deficiency anemia, unspecified: Secondary | ICD-10-CM

## 2017-06-05 MED ORDER — SODIUM CHLORIDE 0.9 % IV SOLN
510.0000 mg | Freq: Once | INTRAVENOUS | Status: AC
  Administered 2017-06-05: 510 mg via INTRAVENOUS
  Filled 2017-06-05: qty 17

## 2017-06-05 NOTE — Discharge Instructions (Signed)

## 2017-06-06 ENCOUNTER — Other Ambulatory Visit: Payer: Self-pay

## 2017-06-06 ENCOUNTER — Encounter (HOSPITAL_COMMUNITY): Payer: Self-pay | Admitting: *Deleted

## 2017-06-06 ENCOUNTER — Ambulatory Visit (HOSPITAL_COMMUNITY): Payer: Medicare Other | Admitting: Anesthesiology

## 2017-06-06 ENCOUNTER — Encounter (HOSPITAL_COMMUNITY)
Admission: RE | Disposition: A | Discharge status: Home / Self Care | Payer: Self-pay | Source: Ambulatory Visit | Attending: Internal Medicine

## 2017-06-06 ENCOUNTER — Ambulatory Visit (HOSPITAL_COMMUNITY)
Admission: RE | Admit: 2017-06-06 | Discharge: 2017-06-06 | Disposition: A | Discharge status: Home / Self Care | Payer: Medicare Other | Source: Ambulatory Visit | Attending: Internal Medicine | Admitting: Internal Medicine

## 2017-06-06 DIAGNOSIS — E669 Obesity, unspecified: Secondary | ICD-10-CM | POA: Insufficient documentation

## 2017-06-06 DIAGNOSIS — Z87891 Personal history of nicotine dependence: Secondary | ICD-10-CM | POA: Diagnosis not present

## 2017-06-06 DIAGNOSIS — Z86010 Personal history of colonic polyps: Secondary | ICD-10-CM | POA: Diagnosis not present

## 2017-06-06 DIAGNOSIS — F329 Major depressive disorder, single episode, unspecified: Secondary | ICD-10-CM | POA: Diagnosis not present

## 2017-06-06 DIAGNOSIS — J449 Chronic obstructive pulmonary disease, unspecified: Secondary | ICD-10-CM | POA: Diagnosis not present

## 2017-06-06 DIAGNOSIS — Z885 Allergy status to narcotic agent status: Secondary | ICD-10-CM | POA: Diagnosis not present

## 2017-06-06 DIAGNOSIS — E119 Type 2 diabetes mellitus without complications: Secondary | ICD-10-CM | POA: Insufficient documentation

## 2017-06-06 DIAGNOSIS — K222 Esophageal obstruction: Secondary | ICD-10-CM | POA: Diagnosis not present

## 2017-06-06 DIAGNOSIS — R1310 Dysphagia, unspecified: Secondary | ICD-10-CM | POA: Diagnosis not present

## 2017-06-06 DIAGNOSIS — E785 Hyperlipidemia, unspecified: Secondary | ICD-10-CM | POA: Insufficient documentation

## 2017-06-06 DIAGNOSIS — Z981 Arthrodesis status: Secondary | ICD-10-CM | POA: Insufficient documentation

## 2017-06-06 DIAGNOSIS — I872 Venous insufficiency (chronic) (peripheral): Secondary | ICD-10-CM | POA: Diagnosis not present

## 2017-06-06 DIAGNOSIS — D689 Coagulation defect, unspecified: Secondary | ICD-10-CM | POA: Insufficient documentation

## 2017-06-06 DIAGNOSIS — M545 Low back pain: Secondary | ICD-10-CM | POA: Diagnosis not present

## 2017-06-06 DIAGNOSIS — M1990 Unspecified osteoarthritis, unspecified site: Secondary | ICD-10-CM | POA: Diagnosis not present

## 2017-06-06 DIAGNOSIS — I110 Hypertensive heart disease with heart failure: Secondary | ICD-10-CM | POA: Diagnosis not present

## 2017-06-06 DIAGNOSIS — Z6830 Body mass index (BMI) 30.0-30.9, adult: Secondary | ICD-10-CM | POA: Diagnosis not present

## 2017-06-06 DIAGNOSIS — I509 Heart failure, unspecified: Secondary | ICD-10-CM | POA: Insufficient documentation

## 2017-06-06 DIAGNOSIS — Z96651 Presence of right artificial knee joint: Secondary | ICD-10-CM | POA: Diagnosis not present

## 2017-06-06 DIAGNOSIS — K219 Gastro-esophageal reflux disease without esophagitis: Secondary | ICD-10-CM

## 2017-06-06 DIAGNOSIS — G9050 Complex regional pain syndrome I, unspecified: Secondary | ICD-10-CM | POA: Insufficient documentation

## 2017-06-06 HISTORY — DX: Anxiety disorder, unspecified: F41.9

## 2017-06-06 HISTORY — PX: ESOPHAGOGASTRODUODENOSCOPY (EGD) WITH PROPOFOL: SHX5813

## 2017-06-06 HISTORY — PX: BALLOON DILATION: SHX5330

## 2017-06-06 LAB — GLUCOSE, CAPILLARY: GLUCOSE-CAPILLARY: 171 mg/dL — AB (ref 65–99)

## 2017-06-06 SURGERY — ESOPHAGOGASTRODUODENOSCOPY (EGD) WITH PROPOFOL
Anesthesia: Monitor Anesthesia Care

## 2017-06-06 MED ORDER — SODIUM CHLORIDE 0.9 % IV SOLN: INTRAVENOUS | Status: DC

## 2017-06-06 MED ORDER — PROPOFOL 10 MG/ML IV BOLUS
INTRAVENOUS | Status: DC | PRN
  Administered 2017-06-06: 50 mg via INTRAVENOUS

## 2017-06-06 MED ORDER — PROPOFOL 500 MG/50ML IV EMUL
INTRAVENOUS | Status: DC | PRN
  Administered 2017-06-06: 150 ug/kg/min via INTRAVENOUS

## 2017-06-06 MED ORDER — PROPOFOL 10 MG/ML IV BOLUS
INTRAVENOUS | Status: AC
  Filled 2017-06-06: qty 40

## 2017-06-06 MED ORDER — LIDOCAINE 2% (20 MG/ML) 5 ML SYRINGE
INTRAMUSCULAR | Status: DC | PRN
  Administered 2017-06-06: 30 mg via INTRAVENOUS

## 2017-06-06 MED ORDER — LACTATED RINGERS IV SOLN
INTRAVENOUS | Status: DC
  Administered 2017-06-06: 1000 mL via INTRAVENOUS

## 2017-06-06 SURGICAL SUPPLY — 15 items

## 2017-06-06 NOTE — Anesthesia Postprocedure Evaluation (Signed)
Anesthesia Post Note  Patient: SAQUAN FURTICK  Procedure(s) Performed: ESOPHAGOGASTRODUODENOSCOPY (EGD) WITH PROPOFOL (N/A ) BALLOON DILATION (N/A )     Patient location during evaluation: PACU Anesthesia Type: MAC Level of consciousness: awake and alert Pain management: pain level controlled Vital Signs Assessment: post-procedure vital signs reviewed and stable Respiratory status: spontaneous breathing, nonlabored ventilation, respiratory function stable and patient connected to nasal cannula oxygen Cardiovascular status: stable and blood pressure returned to baseline Postop Assessment: no apparent nausea or vomiting Anesthetic complications: no    Last Vitals:  Vitals:   06/06/17 0940 06/06/17 0950  BP: 111/64 (!) 130/95  Pulse: 85 84  Resp: (!) 9 13  Temp:    SpO2: 99% 97%    Last Pain:  Vitals:   06/06/17 0730  TempSrc: Oral                 Fortunata Betty S

## 2017-06-06 NOTE — Transfer of Care (Signed)
Immediate Anesthesia Transfer of Care Note  Patient: ARISTOTLE LIEB  Procedure(s) Performed: Procedure(s): ESOPHAGOGASTRODUODENOSCOPY (EGD) WITH PROPOFOL (N/A) BALLOON DILATION (N/A)  Patient Location: PACU  Anesthesia Type:MAC  Level of Consciousness: Patient easily awoken, sedated, comfortable, cooperative, following commands, responds to stimulation.   Airway & Oxygen Therapy: Patient spontaneously breathing, ventilating well, oxygen via simple oxygen mask.  Post-op Assessment: Report given to PACU RN, vital signs reviewed and stable, moving all extremities.   Post vital signs: Reviewed and stable.  Complications: No apparent anesthesia complications  Last Vitals:  Vitals:   06/06/17 0730 06/06/17 0931  BP: 129/82 (!) 87/66  Pulse: 87 90  Resp: 14 (!) 9  Temp: 36.6 C   SpO2: 96% 95%    Last Pain:  Vitals:   06/06/17 0730  TempSrc: Oral         Complications: No apparent anesthesia complications

## 2017-06-06 NOTE — Op Note (Signed)
[facility] Patient Name: James Parsons Procedure Date: 06/06/2017 MRN: [id number] Attending MD: Docia Chuck. Henrene Pastor , MD Date of Birth: Jul 20, 1945 CSN: [id number] Age: 72 Admit Type: Outpatient Procedure:                Upper GI endoscopy, with balloon dilation of the                            esophagus-16.5 mm Max Indications:              Dysphagia, Therapeutic procedure. Previous                            esophageal dilation December 2017 (13.5 Mm) and                            April 2018 (15.5 mm) Providers:                Docia Chuck. Henrene Pastor, MD, Cleda Daub, RN, Elspeth Cho Tech., Technician, Charolette Child,                            Technician, Heide Scales, CRNA Referring MD:              Medicines:                Monitored Anesthesia Care Complications:            No immediate complications. Estimated Blood Loss:     Estimated blood loss: none. Procedure:                Pre-Anesthesia Assessment:                           - Prior to the procedure, a History and Physical                            was performed, and patient medications and                            allergies were reviewed. The patient's tolerance of                            previous anesthesia was also reviewed. The risks                            and benefits of the procedure and the sedation                            options and risks were discussed with the patient.                            All questions were answered, and informed consent  was obtained. Prior Anticoagulants: The patient has                            taken no previous anticoagulant or antiplatelet                            agents. ASA Grade Assessment: III - A patient with                            severe systemic disease. After reviewing the risks                            and benefits, the patient was deemed in                            satisfactory condition  to undergo the procedure.                           After obtaining informed consent, the endoscope was                            passed under direct vision. Throughout the                            procedure, the patient's blood pressure, pulse, and                            oxygen saturations were monitored continuously. The                            EG-[id number] ([id number]) scope was introduced through the                            mouth, and advanced to the second part of duodenum.                            The upper GI endoscopy was accomplished without                            difficulty. The patient tolerated the procedure                            well. Scope In: Scope Out: Findings:      One moderate benign-appearing, intrinsic stenosis was found 40 cm from       the incisors. This measured 1.3 cm (inner diameter) x 7 cm (in length)       proximally. A TTS dilator was passed through the scope. Dilation with a       15-16.5-18 mm balloon dilator was performed to 16.5 mm pulling the       balloon through the lengthy stenosis. Post dilation revealed linear       superficial laceration consistent with desired ring/stricture       disruption. Minimal heme. Tolerated well.      The exam of the esophagus was otherwise  normal.      The stomach was normal, save small hiatal hernia.      The examined duodenum was normal.      The cardia and gastric fundus were normal on retroflexion. Impression:               - Benign-appearing lengthy esophageal stenosis.                            Dilated to 16.5 mm.                           - Normal stomach.                           - Normal examined duodenum.                           - No specimens collected. Moderate Sedation:      none Recommendation:           1. Post-dilation diet                           2. Continue pantoprazole 40 mg TWICE daily                           3. Repeat upper endoscopy in about 6 months for                             retreatment.                           4. Resume care with PCP and other specialists in                            the interim Procedure Code(s):        --- Professional ---                           [phone], Esophagogastroduodenoscopy, flexible,                            transoral; with transendoscopic balloon dilation of                            esophagus (less than 30 mm diameter) Diagnosis Code(s):        --- Professional ---                           K22.2, Esophageal obstruction                           R13.10, Dysphagia, unspecified CPT copyright 2016 [employer]. All rights reserved. The codes documented in this report are preliminary and upon coder review may  be revised to meet current compliance requirements. Docia Chuck. Henrene Pastor, MD 06/06/2017 9:38:55 AM This report has been signed electronically. Number of Addenda: 0

## 2017-06-06 NOTE — H&P (Signed)
HISTORY OF PRESENT ILLNESS:  James Parsons is a 72 y.o. male with MULTIPLE SIGNIFICANT medical problems as listed below who presents today regarding the need for follow-up therapeutic endoscopy for dysphagia. The patient last underwent upper endoscopy 07/28/2016 for significant dysphagia. He was found to have complex stricturing of the esophagus as manifested by multiple circumferential scarring or stenotic lesions over the course of the distal 7 cm of the esophagus with acute esophagitis proximal to this area. He was sequentially balloon dilated to a maximal diameter of 15.5 mm. Post dilation he was placed on pantoprazole 40 mg twice daily. It was recommended that he return for repeat dilation in about 4 weeks. However, he was hospitalized in May with congestive heart failure associated with respiratory compromise. Ejection fraction around that time of 25-35%. Subsequently has had his medical therapies adjusted. He has been doing better and is to the point that he is able to consider repeat endoscopy and dilation. He is accompanied today by his wife. He has been compliant with PPI therapy. They tell me that his swallowing has been about 75% better he still needs to be careful. No heartburn. He is on a number of oral agents for diabetes. No blood thinners. GI review of systems is otherwise negative. Screening colonoscopy 2014 was negative for neoplasia  REVIEW OF SYSTEMS:  All non-GI ROS negative was otherwise stated in the history of present illness except for arthritis, back pain      Past Medical History:  Diagnosis Date  . Allergy   . Anemia   . Arthritis   . Borderline hypertension   . CHF (congestive heart failure) ([facility])   . Clotting disorder (H[facility])    bleeds easily-no dx  . COPD (chronic obstructive pulmonary disease) ([place])    denies  . Depression    hx of   . Diabetes mellitus   . Diverticulosis of colon   . Full dentures   . GERD (gastroesophageal reflux disease)    . Hx of colonic polyps   . Hyperlipidemia   . Hypertension   . Lipoma   . Low back pain   . Mental disorder   . Obesity   . Pneumonia    hx  . RSD (reflex sympathetic dystrophy)   . Venous insufficiency          Past Surgical History:  Procedure Laterality Date  . BACK SURGERY  2005,2007  . C3-4 anterior cervical discectomy and fusion with plating at c3-4  05/2006   Dr. Arnoldo Morale  . CARDIAC CATHETERIZATION N/A 04/25/2016   Procedure: Right/Left Heart Cath and Coronary Angiography;  Surgeon: Peter M Martinique, MD;  Location: [place] [facility];  Service: Cardiovascular;  Laterality: N/A;  . COLONOSCOPY    . COLONOSCOPY W/ POLYPECTOMY    . excision of lipoma from right olecranon area  11/2000   Dr. Rise [name]  . KNEE ARTHROSCOPY     right knee  . KNEE ARTHROSCOPY Right 06/06/2014   Procedure: ARTHROSCOPY RIGHT KNEE WITH REMOVAL OF FIBROUS BANDS;  Surgeon: Kerin Salen, MD;  Location: [facility];  Service: Orthopedics;  Laterality: Right;  . LUMBAR LAMINECTOMY/DECOMPRESSION MICRODISCECTOMY N/A 01/08/2014   Procedure: L4-S1 Decompression with removal and reimplantation of spinal cord stimulator battery ;  Surgeon: Melina Schools, MD;  Location: [facility];  Service: Orthopedics;  Laterality: N/A;  . microdiscectomy and decompression  05/2002   Dr. Tonita Cong  . morphine pump  2009   due to reaction to morphine/changed to fentanyl  .  PAIN PUMP IMPLANTATION     with fentanyl  . spinal cord stimulator implanted     for pain per Dr. Maryruth Eve  . subcut pain pump implanted    . TONSILLECTOMY    . TOTAL KNEE ARTHROPLASTY  02/29/2012   Procedure: TOTAL KNEE ARTHROPLASTY;  Surgeon: Kerin Salen, MD;  Location: [place];  Service: Orthopedics;  Laterality: Right;    Social History James Parsons  reports that he quit smoking about 14 years ago. His smoking use included cigarettes. He has a 120.00 pack-year smoking history. he has never used  smokeless tobacco. He reports that he does not drink alcohol or use drugs.  family history includes Cancer in his father.       Allergies  Allergen Reactions  . Codeine Itching  . Morphine And Related Other (See Comments)    hallucinations       PHYSICAL EXAMINATION: Vital signs: BP 110/60   Pulse 82   Ht 5' 7"  (1.702 m)   Wt 196 lb (88.9 kg)   BMI 30.70 kg/m   Constitutional: generally well-appearing, no acute distress Psychiatric: alert and oriented x3, cooperative Eyes: extraocular movements intact, anicteric, conjunctiva pink Mouth: oral pharynx moist, no lesions Neck: supple no lymphadenopathy Cardiovascular: heart regular rate and rhythm, no murmur Lungs: clear to auscultation bilaterally Abdomen: soft, nontender, nondistended, no obvious ascites, no peritoneal signs, normal bowel sounds, no organomegaly Rectal: Omitted clubbing, cyanosis, or Extremities: no lower extremity edema bilaterally Skin: no lesions on visible extremities Neuro: No focal deficits. Cranial nerves intact  ASSESSMENT:  #1. 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 Status post balloon dilation to 15 mm April 2018. On twice a day PPI. Problem still persists with dysphagia though improved #2. Multiple significant medical problems that that this patient at high risk including congestive heart failure with low ejection fraction, diabetes mellitus, and COPD #3. Colonoscopy 2014 negative for neoplasia  PLAN:  #1. Upper endoscopy with esophageal dilation. The patient is HIGH RISK given the above listed comorbidities. Examination will need to be performed at the hospital with MAC.The nature of the procedure, as well as the risks, benefits, and alternatives were carefully and thoroughly reviewed with the patient. Ample time for discussion and questions allowed. The patient understood, was satisfied, and agreed to proceed. #2. Continue twice a  day PPI #3. Hold oral diabetic agents the day of the examination to avoid and wanted hypoglycemia   GI ATTENDING No interval changes. Wife at bedside. Plans for EGD with esophageal dilation. The nature of the procedure, as well as the risks, benefits, and alternatives were carefully and thoroughly reviewed with the patient. Ample time for discussion and questions allowed. The patient understood, was satisfied, and agreed to proceed. Docia Chuck. Geri Seminole., M.D. [facility] Division of Gastroenterology

## 2017-06-06 NOTE — Anesthesia Preprocedure Evaluation (Signed)
Anesthesia Evaluation  Patient identified by MRN, date of birth, ID band Patient awake    Reviewed: Allergy & Precautions, H&P , NPO status , Patient's Chart, lab work & pertinent test results  Airway Mallampati: II   Neck ROM: full    Dental   Pulmonary COPD, former smoker,    breath sounds clear to auscultation       Cardiovascular hypertension, + CAD, + Peripheral Vascular Disease and +CHF   Rhythm:regular Rate:Normal     Neuro/Psych PSYCHIATRIC DISORDERS Anxiety Depression  Neuromuscular disease    GI/Hepatic GERD  ,  Endo/Other  diabetes, Type 2  Renal/GU      Musculoskeletal  (+) Arthritis ,   Abdominal   Peds  Hematology  (+) anemia ,   Anesthesia Other Findings   Reproductive/Obstetrics                             Anesthesia Physical Anesthesia Plan  ASA: III  Anesthesia Plan: MAC   Post-op Pain Management:    Induction: Intravenous  PONV Risk Score and Plan: 1 and Propofol infusion and Treatment may vary due to age or medical condition  Airway Management Planned: Nasal Cannula  Additional Equipment:   Intra-op Plan:   Post-operative Plan:   Informed Consent: I have reviewed the patients History and Physical, chart, labs and discussed the procedure including the risks, benefits and alternatives for the proposed anesthesia with the patient or authorized representative who has indicated his/her understanding and acceptance.     Plan Discussed with: CRNA and Anesthesiologist  Anesthesia Plan Comments:         Anesthesia Quick Evaluation

## 2017-06-06 NOTE — Discharge Instructions (Signed)
Esophagogastroduodenoscopy, Care After °Refer to this sheet in the next few weeks. These instructions provide you with information about caring for yourself after your procedure. Your health care provider may also give you more specific instructions. Your treatment has been planned according to current medical practices, but problems sometimes occur. Call your health care provider if you have any problems or questions after your procedure. °What can I expect after the procedure? °After the procedure, it is common to have: °· A sore throat. °· Nausea. °· Bloating. °· Dizziness. °· Fatigue. ° °Follow these instructions at home: °· Do not eat or drink anything until the numbing medicine (local anesthetic) has worn off and your gag reflex has returned. You will know that the local anesthetic has worn off when you can swallow comfortably. °· Do not drive for 24 hours if you received a medicine to help you relax (sedative). °· If your health care provider took a tissue sample for testing during the procedure, make sure to get your test results. This is your responsibility. Ask your health care provider or the department performing the test when your results will be ready. °· Keep all follow-up visits as told by your health care provider. This is important. °Contact a health care provider if: °· You cannot stop coughing. °· You are not urinating. °· You are urinating less than usual. °Get help right away if: °· You have trouble swallowing. °· You cannot eat or drink. °· You have throat or chest pain that gets worse. °· You are dizzy or light-headed. °· You faint. °· You have nausea or vomiting. °· You have chills. °· You have a fever. °· You have severe abdominal pain. °· You have black, tarry, or bloody stools. °This information is not intended to replace advice given to you by your health care provider. Make sure you discuss any questions you have with your health care provider. °Document Released: 03/14/2012 Document  Revised: 09/03/2015 Document Reviewed: 02/19/2015 °Elsevier Interactive Patient Education © 2018 [employer] ° °

## 2017-06-06 NOTE — Anesthesia Procedure Notes (Signed)
Procedure Name: MAC Date/Time: 06/06/2017 9:06 AM Performed by: Deliah Boston, CRNA Pre-anesthesia Checklist: Patient identified, Emergency Drugs available, Suction available and Patient being monitored Patient Re-evaluated:Patient Re-evaluated prior to induction Oxygen Delivery Method: Simple face mask Preoxygenation: Pre-oxygenation with 100% oxygen Placement Confirmation: positive ETCO2,  CO2 detector and breath sounds checked- equal and bilateral

## 2017-06-07 ENCOUNTER — Encounter (HOSPITAL_COMMUNITY): Payer: Self-pay | Admitting: Internal Medicine

## 2017-06-14 ENCOUNTER — Telehealth (HOSPITAL_COMMUNITY): Payer: Self-pay | Admitting: *Deleted

## 2017-06-14 ENCOUNTER — Other Ambulatory Visit (HOSPITAL_COMMUNITY): Payer: Medicare Other

## 2017-06-14 NOTE — Telephone Encounter (Signed)
[facility] sent a staff message to notify me that patient cancelled [facility]. Message routed to [facility] nurse.

## 2017-06-27 ENCOUNTER — Other Ambulatory Visit: Payer: Self-pay

## 2017-06-27 DIAGNOSIS — Z1212 Encounter for screening for malignant neoplasm of rectum: Principal | ICD-10-CM

## 2017-06-27 DIAGNOSIS — Z1211 Encounter for screening for malignant neoplasm of colon: Secondary | ICD-10-CM

## 2017-06-27 LAB — POC HEMOCCULT BLD/STL (HOME/3-CARD/SCREEN)
Card #2 Fecal Occult Blod, POC: NEGATIVE
FECAL OCCULT BLD: NEGATIVE
FECAL OCCULT BLD: NEGATIVE

## 2017-07-05 ENCOUNTER — Telehealth (HOSPITAL_COMMUNITY): Payer: Self-pay

## 2017-07-05 NOTE — Telephone Encounter (Signed)
Pt c/d cardiac MRI b/c pt. Has fentanyl pump. Dr. Aundra Dubin  Made aware. Pt has confirmed appt, on 5/7 @ 3 pm

## 2017-08-01 ENCOUNTER — Other Ambulatory Visit (HOSPITAL_COMMUNITY): Payer: Self-pay | Admitting: *Deleted

## 2017-08-01 MED ORDER — CARVEDILOL 12.5 MG PO TABS: ORAL_TABLET | ORAL | 3 refills | Status: DC

## 2017-08-13 NOTE — Progress Notes (Signed)
MEDICARE ANNUAL WELLNESS VISIT AND FOLLOW UP Assessment:   Diagnoses and all orders for this visit:  Encounter for Medicare annual wellness exam  Venous (peripheral) insufficiency Reminded to wear compression hose, elevate extremities, weight daily  Chronic systolic congestive heart failure (HCC) Weights daily, limit salt Followed by cardiology  Non-ischemic cardiomyopathy ([place]) Followed by cardiology  LBBB (left bundle branch block) Followed by cardiology  Essential hypertension Continue medication Monitor blood pressure at home; call if consistently over 130/80 Continue DASH diet.   Reminder to go to the ER if any CP, SOB, nausea, dizziness, severe HA, changes vision/speech, left arm numbness and tingling and jaw pain.  CAD in native artery Control blood pressure, cholesterol, glucose, increase exercise.  Followed by cardiology  Atherosclerosis of aorta ([place]) Control blood pressure, cholesterol, glucose, increase exercise.  Followed by cardiology  Chronic obstructive pulmonary disease, unspecified COPD type ([place]) Avoid triggers, continue inhaler as needed  Gastroesophageal reflux disease, esophagitis presence not specified Avoid triggers, continue PPI, followed by GI  Esophageal stricture Followed by GI, continue PPI  Dysphagia, unspecified type Reminded to take small bites, avoid eating while distracted Followed by GI   Benign neoplasm of colon, unspecified part of colon Up to date on colonoscopies, followed by GI  Controlled type 2 diabetes mellitus with diabetic nephropathy, without long-term current use of insulin [facility]) Education: Reviewed 'ABCs' of diabetes management (respective goals in parentheses):  A1C (<7), blood pressure (<130/80), and cholesterol (LDL <70) Reminded Eye Exam yearly and Dental Exam every 6 months. Dietary/physical activity recommendations reviewed Just had foot exam - defer  RSD (reflex sympathetic dystrophy) Followed by  ortho/pain management  Osteoarthritis, unspecified osteoarthritis type, unspecified site Followed by ortho/pain management  Vitamin D deficiency Near goal at recent check; continue to recommend supplementation for goal of 70-100 Defer vitamin D level  Class 1 obesity due to excess calories with serious comorbidity and body mass index (BMI) of 30.0 to 30.9 in adult Long discussion about weight loss, diet, and exercise Recommended diet heavy in fruits and veggies and low in animal meats, cheeses, and dairy products, appropriate calorie intake Discussed appropriate weight for height  Follow up at next visit  Mixed hyperlipidemia Continue medications: atorvastatin Continue low cholesterol diet and exercise.  Check lipid panel.   Medication management CBC, CMP/GFR, magnesium  Chronic bilateral low back pain without sciatica Followed by ortho/pain management  Diverticulosis of large intestine without hemorrhage Up to date on colonoscopies, increase fiber  Chronic pain associated with significant psychosocial dysfunction Followed by ortho/pain management  Anemia, unspecified type CBC    Over 30 minutes of exam, counseling, chart review, and critical decision making was performed  Future Appointments  Date Time Provider [place]  08/15/2017  3:00 PM Larey Dresser, MD [facility] None  11/20/2017  2:30 PM Unk Pinto, MD [facility] None  05/30/2018  2:00 PM Unk Pinto, MD [facility] None     Plan:   During the course of the visit the patient was educated and counseled about appropriate screening and preventive services including:    Pneumococcal vaccine   Influenza vaccine  Prevnar 13  Td vaccine  Screening electrocardiogram  Colorectal cancer screening  Diabetes screening  Glaucoma screening  Nutrition counseling    Subjective:  James Parsons is a 72 y.o. male who presents for Medicare Annual Wellness Visit and 3 month follow up for  HTN, hyperlipidemia, diabetes, and vitamin D Def. 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 Last surgery was a lumbar fusion in 01/2015 by Dr Rolena Infante.  He did have a spinal cord stimulator implanted in 2005, but no longer uses it, and follows with [facility]. In Jan 2018,  he was hospitalized with Acute/chronic CHF and Heart cath  showed non-obstructive CAD. Patient is followed in the [facility] with most recent ECHO 03/2017 showing LV EF 35% with grade 1 diastolic dysfunction.    BMI is Body mass index is 31.63 kg/m.  He has a history of Diastolic, denies dyspnea on exertion, orthopnea, paroxysmal nocturnal dyspnea and edema. Positive for none. Wt Readings from Last 3 Encounters:  08/14/17 205 lb (93 kg)  06/06/17 200 lb (90.7 kg)  06/05/17 200 lb (90.7 kg)    His blood pressure has been controlled at home, today their BP is BP: 102/64 He does not workout. He denies chest pain, shortness of breath, dizziness.   He is on cholesterol medication (atorvastatin 40 mg daily) and denies myalgias. His cholesterol is not at goal of LDL <70. The cholesterol last visit was:   Lab Results  Component Value Date   CHOL 141 05/11/2017   HDL 41 05/11/2017   LDLCALC 76 05/11/2017   LDLDIRECT 178.6 09/08/2009   TRIG 137 05/11/2017   CHOLHDL 3.4 05/11/2017   He has been working on diet for T2DM on metformin and glimepiride, and denies foot ulcerations, increased appetite, nausea, paresthesia of the feet, polydipsia, polyuria, visual disturbances, vomiting and weight loss. Not currently checking blood sugars. Last A1C in the office was:  Lab Results  Component Value Date   HGBA1C 7.0 (H) 05/11/2017   Last GFR Lab Results  Component Value Date   GFRNONAA >60 05/12/2017    Patient is on Vitamin D supplement.   Lab Results  Component Value Date   VD25OH 57 05/11/2017      Medication  Review:  Current Outpatient Medications (Endocrine & Metabolic):  .  glimepiride (AMARYL) 4 MG tablet, TAKE 1 TABLET TWICE DAILY WITH MEALS. DO NOT TAKE IF YOU DO NOT EAT. .  metFORMIN (GLUCOPHAGE XR) 500 MG 24 hr tablet, Take 2 tablets 2 x / day for Diabetes (Patient taking differently: Take 1,000 mg by mouth 2 (two) times daily. )  Current Outpatient Medications (Cardiovascular):  .  atorvastatin (LIPITOR) 80 MG tablet, Take 40 mg by mouth daily. .  carvedilol (COREG) 12.5 MG tablet, TAKE 1 TABLET BY MOUTH TWICE A DAY WITH A MEAL .  furosemide (LASIX) 20 MG tablet, Take 20 mg by mouth daily. .  sacubitril-valsartan (ENTRESTO) 49-51 MG, Take 1 tablet by mouth 2 (two) times daily. [name]  spironolactone (ALDACTONE) 25 MG tablet, Take 1 tablet (25 mg total) by mouth at bedtime.  Current Outpatient Medications (Respiratory):  .  albuterol (VENTOLIN HFA) 108 (90 Base) MCG/ACT inhaler, Inhale 2 puffs into the lungs every 4 (four) hours as needed for wheezing or shortness of breath. (Patient not taking: Reported on 05/25/2017)  Current Outpatient Medications (Analgesics):  .  aspirin 81 MG chewable tablet, Chew 81 mg by mouth at bedtime. .  celecoxib (CELEBREX) 200 MG capsule, Take 200 mg by mouth daily as needed (for headache).  .  Tapentadol HCl (NUCYNTA) 100 MG TABS, Take 100 mg by mouth See admin instructions. Take 100 mg by mouth twice daily, may also take 100 mg extra during the day as needed for pain  Current Outpatient Medications (Hematological):  [name]  folic acid (FOLVITE) 1 MG tablet, Take 1 tablet (1 mg total) by mouth daily.  Current Outpatient Medications (Other):  [name]  Cholecalciferol (VITAMIN D-3) 5000 UNITS TABS, Take 5,000 Units by mouth 2 (two) times daily. .  cyclobenzaprine (FLEXERIL) 5 MG tablet, Take 1 tablet (5 mg total) by mouth 3 (three) times daily as needed for muscle spasms. Muscle spasm. .  DULoxetine (CYMBALTA) 60 MG capsule, Take 60 mg by mouth daily.  [name]  lidocaine  (LIDODERM) 5 %, Place 1 patch onto the skin daily as needed (pain). (Patient taking differently: Place 1 patch onto the skin daily as needed (for pain). ) .  methotrexate (RHEUMATREX) 2.5 MG tablet, Take 15 mg by mouth every Friday.  .  metoCLOPramide (REGLAN) 10 MG tablet, TAKE 1 TABLET BY MOUTH 3 TIMES A DAY BEFORE MEALS (Patient taking differently: TAKE 10 MG BY MOUTH 2 TIMES A DAY BEFORE MEALS) .  pantoprazole (PROTONIX) 40 MG tablet, Take 1 tablet (40 mg total) by mouth 2 (two) times daily before a meal. .  pregabalin (LYRICA) 150 MG capsule, Take 150 mg by mouth 2 (two) times daily. [name]  PRESCRIPTION MEDICATION, by Intrathecal route continuous. Fentanyl 2mg /ml solution . Daily dose 0.8820 mg Compounded at [facility] [phone] Pump is refilled monthly .  ranitidine (ZANTAC) 300 MG tablet, TAKE 1 TABLET BY MOUTH EVERYDAY AT BEDTIME .  tamsulosin (FLOMAX) 0.4 MG CAPS capsule, TAKE ONE CAPSULE BY MOUTH EVERY DAY AFTER SUPPER (Patient taking differently: TAKE 0.4 MG BY MOUTH EVERY DAY AFTER SUPPER)  Allergies: Allergies  Allergen Reactions  . Codeine Itching  . Morphine And Related Other (See Comments)    hallucinations    Current Problems (verified) has COLONIC POLYPS; Mixed hyperlipidemia; Essential hypertension; Venous (peripheral) insufficiency; COPD (chronic obstructive pulmonary disease) ([place]); Diverticulosis of large intestine; LOW BACK PAIN SYNDROME; DJD (degenerative joint disease); T2_NIDDM; Anemia; Vitamin D deficiency; Medication management; Failed back syndrome of lumbar spine; Chronic pain associated with significant psychosocial dysfunction; Spinal stenosis, multilevel; Spinal cord stimulator status; RSD (reflex sympathetic dystrophy); Obesity; Encounter for Medicare annual wellness exam; Cervical arthritis; Non-ischemic cardiomyopathy ([place]); CAD in native artery; LBBB (left bundle branch block); Atherosclerosis of aorta ([place]); Systolic CHF ([place]);  Dysphagia; Esophageal stricture; and Gastroesophageal reflux disease on their problem list.  Screening Tests Immunization History  Administered Date(s) Administered  . Influenza Split 04/22/2011, 01/20/2012  . Influenza Whole 01/15/2007, 01/21/2009, 03/08/2010  . Influenza, High Dose Seasonal PF 02/07/2013, 12/10/2015  . Influenza-Unspecified 12/10/2013, 01/10/2015, 12/08/2016  . Pneumococcal Conjugate-13 06/09/2015  . Pneumococcal Polysaccharide-23 04/22/2011  . Tdap 04/11/2001, 05/07/2013   Preventative care: Last colonoscopy: 01/24/2013 - due 2024 EGD: 05/2017 Cath 04/2016 Echo 03/2017  Prior vaccinations: TD or Tdap: 2015  Influenza: 2018  Pneumococcal: 2013 Prevnar13: 2017 Shingles/Zostavax: declines  Names of Other Physician/Practitioners you currently use: 1. [facility] here for primary care 2. Dr. Katy Fitch, eye doctor, last visit 08/09/2016, abstracted, DUE 3. Has dentures , dentist   Patient Care Team: Unk Pinto, MD as PCP - General (Internal Medicine) Sable Feil, MD as Consulting Physician (Gastroenterology) Clent Jacks, MD as Consulting Physician (Ophthalmology) Melina Schools, MD as Consulting Physician (Orthopedic Surgery) Lucia Bitter., MD as Physician Assistant (Pain Medicine) Lennie Odor, MD as Referring Physician (Pain Medicine) [name], MD as Consulting Physician (Cardiology)  Surgical: He  has a past surgical history that includes Back surgery ([id number]); morphine pump (2009); excision of lipoma from right olecranon area (  11/2000); microdiscectomy and decompression (05/2002); C3-4 anterior cervical discectomy and fusion with plating at c3-4 (05/2006); spinal cord stimulator implanted; subcut pain pump implanted; Pain pump implantation; Tonsillectomy; Colonoscopy w/ polypectomy; Knee arthroscopy; Total knee arthroplasty (02/29/2012); Lumbar laminectomy/decompression microdiscectomy (N/A,  01/08/2014); Knee arthroscopy (Right, 06/06/2014); Colonoscopy; Cardiac catheterization (N/A, 04/25/2016); Esophagogastroduodenoscopy (egd) with propofol (N/A, 06/06/2017); and Balloon dilation (N/A, 06/06/2017). Family His family history includes Cancer in his father. Social history  He reports that he quit smoking about 15 years ago. His smoking use included cigarettes. He has a 120.00 pack-year smoking history. He has never used smokeless tobacco. He reports that he does not drink alcohol or use drugs.  MEDICARE WELLNESS OBJECTIVES: Physical activity: Current Exercise Habits: The patient does not participate in regular exercise at present, Exercise limited by: orthopedic condition(s) Cardiac risk factors: Cardiac Risk Factors include: advanced age (>67men, >18 women);male gender;hypertension;sedentary lifestyle;dyslipidemia;diabetes mellitus;obesity (BMI >30kg/m2);smoking/ tobacco exposure Depression/mood screen:   Depression screen [facility] 2/9 08/14/2017  Decreased Interest 0  Down, Depressed, Hopeless 0  PHQ - 2 Score 0    ADLs:  In your present state of health, do you have any difficulty performing the following activities: 08/14/2017 06/05/2017  Hearing? N N  Vision? N N  Difficulty concentrating or making decisions? N N  Walking or climbing stairs? Y N  Comment Back injury, walks with cane, can get around in his house -  Dressing or bathing? N N  Doing errands, shopping? N -  Comment - -  Some recent data might be hidden     Cognitive Testing  Alert? Yes  Normal Appearance?Yes  Oriented to person? Yes  Place? Yes   Time? Yes  Recall of three objects?  Yes  Can perform simple calculations? Yes  Displays appropriate judgment?Yes  Can read the correct time from a watch face?Yes  EOL planning: Does Patient Have a Medical Advance Directive?: Yes Type of Advance Directive: Healthcare Power of Attorney, Living will Does patient want to make changes to medical advance directive?: No -  Patient declined Copy of [place] in Chart?: No - copy requested   Objective:   Today's Vitals   08/14/17 1420  BP: 102/64  Pulse: 84  Temp: (!) 97.3 F (36.3 C)  SpO2: 96%  Weight: 205 lb (93 kg)  Height: 5' 7.5" (1.715 m)  PainSc: 7   PainLoc: Back   Body mass index is 31.63 kg/m.  General appearance: alert, no distress, WD/WN, male HEENT: normocephalic, sclerae anicteric, TMs pearly, nares patent, no discharge or erythema, pharynx normal Oral cavity: MMM, no lesions Neck: supple, no lymphadenopathy, no thyromegaly, no masses Heart: RRR, normal S1, S2, no murmurs Lungs: CTA bilaterally, no wheezes, rhonchi, or rales Abdomen: +bs, soft, non tender, non distended, no masses, no hepatomegaly, no splenomegaly Musculoskeletal: nontender, no swelling, no obvious deformity Extremities: no edema, no cyanosis, no clubbing Pulses: 2+ symmetric, upper and lower extremities, normal cap refill Neurological: alert, oriented x 3, CN2-12 intact, strength normal upper extremities and lower extremities, sensation normal throughout,  Gait slow with cane Psychiatric: normal affect, behavior normal, pleasant   Medicare Attestation I have personally reviewed: The patient's medical and social history Their use of alcohol, tobacco or illicit drugs Their current medications and supplements The patient's functional ability including ADLs,fall risks, home safety risks, cognitive, and hearing and visual impairment Diet and physical activities Evidence for depression or mood disorders  The patient's weight, height, BMI, and visual acuity have been recorded in the chart.  I have made referrals, counseling, and provided education to the patient based on review of the above and I have provided the patient with a written personalized care plan for preventive services.     Izora Ribas, NP   08/14/2017

## 2017-08-14 ENCOUNTER — Encounter: Payer: Self-pay | Admitting: Adult Health

## 2017-08-14 ENCOUNTER — Ambulatory Visit: Payer: Medicare Other | Admitting: Adult Health

## 2017-08-14 VITALS — BP 102/64 | HR 84 | Temp 97.3°F | Ht 67.5 in | Wt 205.0 lb

## 2017-08-14 DIAGNOSIS — R6889 Other general symptoms and signs: Secondary | ICD-10-CM | POA: Diagnosis not present

## 2017-08-14 DIAGNOSIS — I447 Left bundle-branch block, unspecified: Secondary | ICD-10-CM | POA: Diagnosis not present

## 2017-08-14 DIAGNOSIS — M1990 Unspecified osteoarthritis, unspecified site: Secondary | ICD-10-CM

## 2017-08-14 DIAGNOSIS — I428 Other cardiomyopathies: Secondary | ICD-10-CM

## 2017-08-14 DIAGNOSIS — D649 Anemia, unspecified: Secondary | ICD-10-CM

## 2017-08-14 DIAGNOSIS — G8929 Other chronic pain: Secondary | ICD-10-CM

## 2017-08-14 DIAGNOSIS — E1121 Type 2 diabetes mellitus with diabetic nephropathy: Secondary | ICD-10-CM

## 2017-08-14 DIAGNOSIS — I2510 Atherosclerotic heart disease of native coronary artery without angina pectoris: Secondary | ICD-10-CM | POA: Diagnosis not present

## 2017-08-14 DIAGNOSIS — M545 Low back pain: Secondary | ICD-10-CM

## 2017-08-14 DIAGNOSIS — I872 Venous insufficiency (chronic) (peripheral): Secondary | ICD-10-CM

## 2017-08-14 DIAGNOSIS — K222 Esophageal obstruction: Secondary | ICD-10-CM

## 2017-08-14 DIAGNOSIS — Z0001 Encounter for general adult medical examination with abnormal findings: Secondary | ICD-10-CM | POA: Diagnosis not present

## 2017-08-14 DIAGNOSIS — G894 Chronic pain syndrome: Secondary | ICD-10-CM

## 2017-08-14 DIAGNOSIS — E6609 Other obesity due to excess calories: Secondary | ICD-10-CM

## 2017-08-14 DIAGNOSIS — I700 Atherosclerosis of aorta: Secondary | ICD-10-CM

## 2017-08-14 DIAGNOSIS — Z0000 Encounter for general adult medical examination without abnormal findings: Secondary | ICD-10-CM

## 2017-08-14 DIAGNOSIS — R1310 Dysphagia, unspecified: Secondary | ICD-10-CM

## 2017-08-14 DIAGNOSIS — D126 Benign neoplasm of colon, unspecified: Secondary | ICD-10-CM | POA: Diagnosis not present

## 2017-08-14 DIAGNOSIS — K219 Gastro-esophageal reflux disease without esophagitis: Secondary | ICD-10-CM | POA: Diagnosis not present

## 2017-08-14 DIAGNOSIS — I10 Essential (primary) hypertension: Secondary | ICD-10-CM | POA: Diagnosis not present

## 2017-08-14 DIAGNOSIS — G9050 Complex regional pain syndrome I, unspecified: Secondary | ICD-10-CM

## 2017-08-14 DIAGNOSIS — J449 Chronic obstructive pulmonary disease, unspecified: Secondary | ICD-10-CM | POA: Diagnosis not present

## 2017-08-14 DIAGNOSIS — I5022 Chronic systolic (congestive) heart failure: Secondary | ICD-10-CM

## 2017-08-14 DIAGNOSIS — K5730 Diverticulosis of large intestine without perforation or abscess without bleeding: Secondary | ICD-10-CM

## 2017-08-14 DIAGNOSIS — Z6830 Body mass index (BMI) 30.0-30.9, adult: Secondary | ICD-10-CM

## 2017-08-14 DIAGNOSIS — Z79899 Other long term (current) drug therapy: Secondary | ICD-10-CM

## 2017-08-14 DIAGNOSIS — E559 Vitamin D deficiency, unspecified: Secondary | ICD-10-CM

## 2017-08-14 DIAGNOSIS — E782 Mixed hyperlipidemia: Secondary | ICD-10-CM

## 2017-08-14 NOTE — Patient Instructions (Signed)
Aim for 7+ servings of fruits and vegetables daily  80+ fluid ounces of water or unsweet tea for healthy kidneys  Limit alcohol intake - less is better  Limit animal fats in diet for cholesterol and heart health - choose grass fed whenever available  Aim for low stress - take time to unwind and care for your mental health  Aim for 150 min of moderate intensity exercise weekly for heart health, and weights twice weekly for bone health  Aim for 7-9 hours of sleep daily     When it comes to diets, agreement about the perfect plan isn't easy to find, even among the experts. Experts at the [employer] developed an idea known as the Healthy Eating Plate. Just imagine a plate divided into logical, healthy portions.  The emphasis is on diet quality:  Load up on vegetables and fruits - one-half of your plate: Aim for color and variety, and remember that potatoes don't count.  Go for whole grains - one-quarter of your plate: Whole wheat, barley, wheat berries, quinoa, oats, brown rice, and foods made with them. If you want pasta, go with whole wheat pasta.  Protein power - one-quarter of your plate: Fish, chicken, beans, and nuts are all healthy, versatile protein sources. Limit red meat.  The diet, however, does go beyond the plate, offering a few other suggestions.  Use healthy plant oils, such as olive, canola, soy, corn, sunflower and peanut. Check the labels, and avoid partially hydrogenated oil, which have unhealthy trans fats.  If you're thirsty, drink water. Coffee and tea are good in moderation, but skip sugary drinks and limit milk and dairy products to one or two daily servings.  The type of carbohydrate in the diet is more important than the amount. Some sources of carbohydrates, such as vegetables, fruits, whole grains, and beans-are healthier than others.  Finally, stay active.

## 2017-08-15 ENCOUNTER — Other Ambulatory Visit: Payer: Self-pay | Admitting: Adult Health

## 2017-08-15 ENCOUNTER — Encounter (HOSPITAL_COMMUNITY): Payer: Self-pay | Admitting: Cardiology

## 2017-08-15 ENCOUNTER — Ambulatory Visit (HOSPITAL_COMMUNITY)
Admission: RE | Admit: 2017-08-15 | Discharge: 2017-08-15 | Disposition: A | Discharge status: Home / Self Care | Payer: Medicare Other | Source: Ambulatory Visit | Attending: Cardiology | Admitting: Cardiology

## 2017-08-15 VITALS — BP 130/70 | HR 82 | Wt 206.5 lb

## 2017-08-15 DIAGNOSIS — I2510 Atherosclerotic heart disease of native coronary artery without angina pectoris: Secondary | ICD-10-CM | POA: Diagnosis not present

## 2017-08-15 DIAGNOSIS — I110 Hypertensive heart disease with heart failure: Secondary | ICD-10-CM | POA: Insufficient documentation

## 2017-08-15 DIAGNOSIS — Z87891 Personal history of nicotine dependence: Secondary | ICD-10-CM | POA: Diagnosis not present

## 2017-08-15 DIAGNOSIS — Z7982 Long term (current) use of aspirin: Secondary | ICD-10-CM | POA: Diagnosis not present

## 2017-08-15 DIAGNOSIS — E782 Mixed hyperlipidemia: Secondary | ICD-10-CM | POA: Diagnosis not present

## 2017-08-15 DIAGNOSIS — Z7984 Long term (current) use of oral hypoglycemic drugs: Secondary | ICD-10-CM | POA: Diagnosis not present

## 2017-08-15 DIAGNOSIS — Z79899 Other long term (current) drug therapy: Secondary | ICD-10-CM | POA: Diagnosis not present

## 2017-08-15 DIAGNOSIS — Z885 Allergy status to narcotic agent status: Secondary | ICD-10-CM | POA: Insufficient documentation

## 2017-08-15 DIAGNOSIS — G8929 Other chronic pain: Secondary | ICD-10-CM | POA: Diagnosis not present

## 2017-08-15 DIAGNOSIS — E119 Type 2 diabetes mellitus without complications: Secondary | ICD-10-CM | POA: Diagnosis not present

## 2017-08-15 DIAGNOSIS — I5022 Chronic systolic (congestive) heart failure: Secondary | ICD-10-CM | POA: Insufficient documentation

## 2017-08-15 DIAGNOSIS — I428 Other cardiomyopathies: Secondary | ICD-10-CM | POA: Insufficient documentation

## 2017-08-15 DIAGNOSIS — M545 Low back pain: Secondary | ICD-10-CM | POA: Diagnosis not present

## 2017-08-15 LAB — CBC WITH DIFFERENTIAL/PLATELET
BASOS PCT: 0.3 %
Basophils Absolute: 26 cells/uL (ref 0–200)
EOS PCT: 2.5 %
Eosinophils Absolute: 218 cells/uL (ref 15–500)
HCT: 37.9 % — ABNORMAL LOW (ref 38.5–50.0)
Hemoglobin: 13 g/dL — ABNORMAL LOW (ref 13.2–17.1)
Lymphs Abs: 1905 cells/uL (ref 850–3900)
MCH: 30.4 pg (ref 27.0–33.0)
MCHC: 34.3 g/dL (ref 32.0–36.0)
MCV: 88.8 fL (ref 80.0–100.0)
MONOS PCT: 4.3 %
MPV: 10.6 fL (ref 7.5–12.5)
NEUTROS ABS: 6177 {cells}/uL (ref 1500–7800)
Neutrophils Relative %: 71 %
Platelets: 270 10*3/uL (ref 140–400)
RBC: 4.27 10*6/uL (ref 4.20–5.80)
RDW: 13.8 % (ref 11.0–15.0)
Total Lymphocyte: 21.9 %
WBC mixed population: 374 cells/uL (ref 200–950)
WBC: 8.7 10*3/uL (ref 3.8–10.8)

## 2017-08-15 LAB — BASIC METABOLIC PANEL
Anion gap: 7 (ref 5–15)
BUN: 11 mg/dL (ref 6–20)
CO2: 28 mmol/L (ref 22–32)
CREATININE: 0.68 mg/dL (ref 0.61–1.24)
Calcium: 9.6 mg/dL (ref 8.9–10.3)
Chloride: 101 mmol/L (ref 101–111)
GFR calc Af Amer: >60 mL/min (ref >60)
GLUCOSE: 210 mg/dL — AB (ref 65–99)
POTASSIUM: 4.1 mmol/L (ref 3.5–5.1)
SODIUM: 136 mmol/L (ref 135–145)

## 2017-08-15 LAB — COMPLETE METABOLIC PANEL WITH GFR
AG Ratio: 1.4 (calc) (ref 1.0–2.5)
ALKALINE PHOSPHATASE (APISO): 99 U/L (ref 40–115)
ALT: 76 U/L — AB (ref 9–46)
AST: 58 U/L — AB (ref 10–35)
Albumin: 4.3 g/dL (ref 3.6–5.1)
BUN: 13 mg/dL (ref 7–25)
CO2: 31 mmol/L (ref 20–32)
CREATININE: 0.84 mg/dL (ref 0.70–1.18)
Calcium: 9.9 mg/dL (ref 8.6–10.3)
Chloride: 101 mmol/L (ref 98–110)
GFR, Est African American: 102 mL/min/{1.73_m2} (ref >=60)
GFR, Est Non African American: 88 mL/min/{1.73_m2} (ref >=60)
GLOBULIN: 3 g/dL (ref 1.9–3.7)
GLUCOSE: 178 mg/dL — AB (ref 65–99)
Potassium: 5.6 mmol/L — ABNORMAL HIGH (ref 3.5–5.3)
SODIUM: 140 mmol/L (ref 135–146)
Total Bilirubin: 0.8 mg/dL (ref 0.2–1.2)
Total Protein: 7.3 g/dL (ref 6.1–8.1)

## 2017-08-15 LAB — LIPID PANEL
CHOL/HDL RATIO: 3.3 (calc) (ref <5.0)
CHOLESTEROL: 147 mg/dL (ref <200)
HDL: 44 mg/dL (ref >40)
LDL CHOLESTEROL (CALC): 82 mg/dL
NON-HDL CHOLESTEROL (CALC): 103 mg/dL (ref <130)
TRIGLYCERIDES: 118 mg/dL (ref <150)

## 2017-08-15 LAB — TEST AUTHORIZATION

## 2017-08-15 LAB — MAGNESIUM: Magnesium: 1.9 mg/dL (ref 1.5–2.5)

## 2017-08-15 LAB — HEMOGLOBIN A1C
Hgb A1c MFr Bld: 7.4 % of total Hgb — ABNORMAL HIGH (ref <5.7)
Mean Plasma Glucose: 166 (calc)
eAG (mmol/L): 9.2 (calc)

## 2017-08-15 LAB — TSH: TSH: 3.26 mIU/L (ref 0.40–4.50)

## 2017-08-15 LAB — GAMMA GT: GGT: 46 U/L (ref 3–70)

## 2017-08-15 NOTE — Patient Instructions (Signed)
Labs drawn today (if we do not call you, then your lab work was stable)   Your physician has requested that you have an echocardiogram. Echocardiography is a painless test that uses sound waves to create images of your heart. It provides your doctor with information about the size and shape of your heart and how well your heart's chambers and valves are working. This procedure takes approximately one hour. There are no restrictions for this procedure.  Your physician recommends that you schedule a follow-up appointment in: 2 months with Dr. Aundra Dubin

## 2017-08-16 NOTE — Progress Notes (Signed)
Advanced Heart Failure Clinic Note   Primary Care: Dr. [name] Primary Cardiologist: Dr. Gwenlyn Found HF Cardiology: Dr. Aundra Dubin  HPI:  James Parsons is a 72 y.o. 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    Admitted 5/21 -> 09/01/16 with acute SOB. Diuresed with IV lasix and discharged on po lasix 40 mg daily (Increase from his chronic dose).  He initially desaturated with ambulation on RA, but ultimately did not require 02 with distances of up to 400 feet. Discharge weight 183 lbs.  Echo in 12/18 with EF 35%.  He was unable to get a cardiac MRI due to his Fentanyl pump.   Returns today for followup of CHF.  Patient continues to have severe low back pain which limits his activity.  He is limited by his back on not by dyspnea.  Not very active but no significant exertional dyspnea.  No chest pain.  No orthopnea/PND.    Labs (10/18): K 4.3, creatinine 0.84, LDL 81, HDL 40 Labs (1/19): LDL 76, HDL 41, K ?6, creatinine 0.82, hgb 12 Labs (5/19): K 5.1, creatinine 0.89, LDL 82, HDL 44  ECG: NSR, normal (QRS narrow)  Social History: 2 sons, 4 grandchildren. Retired Engineer, structural. Former smoker with > 100 pack years of tobacco abuse.  Stopped smoking in early 2000s. Has been married for 47 years this year.   Past Medical History 1. Chronic systolic CHF: Nonischemic cardiomyopathy.  Possible LBBB cardiomyopathy or viral myocarditis.  - LHC/RHC (1/18): Nonobstructive CAD.  Mean RA 7, PA 33/17, mean PCWP 16, CI 2.61.  - Echo (5/18): EF 25-30% - Echo (12/18): EF 35%, septal-lateral dyssynchrony with septal hypokinesis, normal RV size and systolic function, PASP 42 mmHg.  2. 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    3. HTN 4. Hyperlipidemia 5. DM2 6. Chronic back pain with fentanyl pump.   7. LBBB: Chronic.    Review of systems complete and found to be negative unless listed in HPI.     Family History  Problem Relation Age of Onset  . Cancer Father   . Colon cancer Neg Hx   . Esophageal cancer Neg Hx   . Stomach cancer Neg Hx   . Rectal cancer Neg Hx     Current Outpatient Medications  Medication Sig Dispense Refill  . albuterol (VENTOLIN HFA) 108 (90 Base) MCG/ACT inhaler Inhale 2 puffs into the lungs every 4 (four) hours as needed for wheezing or shortness of breath. 1 Inhaler 0  . aspirin 81 MG chewable tablet Chew 81 mg by mouth at bedtime.    [name] atorvastatin (LIPITOR) 80 MG tablet Take 40 mg by mouth daily.    . carvedilol (COREG) 12.5 MG tablet TAKE 1 TABLET BY MOUTH TWICE A DAY WITH A MEAL 30 tablet 3  . celecoxib (CELEBREX) 200 MG capsule Take 200 mg by mouth daily as needed (for headache).     . Cholecalciferol (VITAMIN D-3) 5000 UNITS TABS Take 5,000 Units by mouth 2 (two) times daily.    . cyclobenzaprine (FLEXERIL) 5 MG tablet Take 1 tablet (5 mg total) by mouth 3 (three) times daily as needed for muscle spasms. Muscle spasm.    . DULoxetine (CYMBALTA) 60 MG capsule Take 60 mg by mouth daily.     . folic acid (FOLVITE) 1 MG tablet Take 1 tablet (1 mg total) by mouth daily. 30 tablet 3  .  furosemide (LASIX) 20 MG tablet Take 20 mg by mouth daily.    [name] glimepiride (AMARYL) 4 MG tablet TAKE 1 TABLET TWICE DAILY WITH MEALS. DO NOT TAKE IF YOU DO NOT EAT. 180 tablet 1  . lidocaine (LIDODERM) 5 % Place 1 patch onto the skin daily as needed (pain). (Patient taking differently: Place 1 patch onto the skin daily as needed (for pain). )    . metFORMIN (GLUCOPHAGE XR) 500 MG 24 hr tablet Take 2 tablets 2 x / day for Diabetes 360 tablet 1  . methotrexate (RHEUMATREX) 2.5 MG tablet Take 15 mg by mouth every Friday.   1  . metoCLOPramide (REGLAN) 10 MG tablet TAKE 1 TABLET BY MOUTH 3 TIMES A DAY BEFORE MEALS 270 tablet 1  . pantoprazole (PROTONIX) 40 MG tablet Take 1 tablet (40 mg total) by mouth 2 (two) times daily before a meal. 180 tablet 3  . pregabalin (LYRICA) 150  MG capsule Take 150 mg by mouth 2 (two) times daily.    [name] PRESCRIPTION MEDICATION by Intrathecal route continuous. Fentanyl 2mg /ml solution . Daily dose 0.8820 mg Compounded at [facility] [phone] Pump is refilled monthly    . ranitidine (ZANTAC) 300 MG tablet TAKE 1 TABLET BY MOUTH EVERYDAY AT BEDTIME 90 tablet 1  . sacubitril-valsartan (ENTRESTO) 49-51 MG Take 1 tablet by mouth 2 (two) times daily. 180 tablet 3  . spironolactone (ALDACTONE) 25 MG tablet Take 1 tablet (25 mg total) by mouth at bedtime. 90 tablet 3  . tamsulosin (FLOMAX) 0.4 MG CAPS capsule TAKE ONE CAPSULE BY MOUTH EVERY DAY AFTER SUPPER (Patient taking differently: TAKE 0.4 MG BY MOUTH EVERY DAY AFTER SUPPER) 90 capsule 1  . Tapentadol HCl (NUCYNTA) 100 MG TABS Take 100 mg by mouth See admin instructions. Take 100 mg by mouth twice daily, may also take 100 mg extra during the day as needed for pain     No current facility-administered medications for this encounter.     Allergies  Allergen Reactions  . Codeine Itching  . Morphine And Related Other (See Comments)    hallucinations    Vitals:   08/15/17 1455  BP: 130/70  Pulse: 82  SpO2: 99%  Weight: 206 lb 8 oz (93.7 kg)   Wt Readings from Last 3 Encounters:  08/15/17 206 lb 8 oz (93.7 kg)  08/14/17 205 lb (93 kg)  06/06/17 200 lb (90.7 kg)    PHYSICAL EXAM: General: NAD Neck: No JVD, no thyromegaly or thyroid nodule.  Lungs: Clear to auscultation bilaterally with normal respiratory effort. CV: Nondisplaced PMI.  Heart regular S1/S2, no S3/S4, no murmur.  No peripheral edema.  No carotid bruit.  Normal pedal pulses.  Abdomen: Soft, nontender, no hepatosplenomegaly, no distention.  Skin: Intact without lesions or rashes.  Neurologic: Alert and oriented x 3.  Psych: Normal affect. Extremities: No clubbing or cyanosis.  HEENT: Normal.   ASSESSMENT & PLAN:  1. Chronic systolic CHF: Nonischemic cardiomyopathy. 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 No family history of cardiomyopathy.  Echo in 12/18 with EF 35% with septal-lateral dyssynchrony.  On exam today, he is not volume overloaded, NYHA class II symptoms.  In the past, he had a LBBB.  This is not present today, narrow QRS.  - Continue lasix 20 mg daily. BMET today => If K is high, will consider Veltassa use.  - Continue Entresto 49/51 bid today.    - Continue Coreg 12.5 mg BID.  - Continue Spiro 25 mg  daily. - He will need an echo for ICD consideration set up.  His LBBB has resolved, he is no longer a candidate for CRT.   - Unable to get cardiac MRI due to Fentanyl pump.  2. CAD: Nonobstructive.   - Continue ASA 81 and statin.   3. Chronic back pain Fentanyl pump.   - Follows with pain management.   Followup in  2 months  Loralie Champagne, MD 08/16/17

## 2017-09-02 ENCOUNTER — Other Ambulatory Visit: Payer: Self-pay | Admitting: Internal Medicine

## 2017-09-02 DIAGNOSIS — E119 Type 2 diabetes mellitus without complications: Secondary | ICD-10-CM

## 2017-09-11 ENCOUNTER — Other Ambulatory Visit: Payer: Self-pay

## 2017-09-11 ENCOUNTER — Ambulatory Visit (HOSPITAL_COMMUNITY): Payer: Medicare Other | Attending: Cardiovascular Disease

## 2017-09-11 DIAGNOSIS — I2510 Atherosclerotic heart disease of native coronary artery without angina pectoris: Secondary | ICD-10-CM | POA: Insufficient documentation

## 2017-09-11 DIAGNOSIS — E785 Hyperlipidemia, unspecified: Secondary | ICD-10-CM | POA: Diagnosis not present

## 2017-09-11 DIAGNOSIS — I447 Left bundle-branch block, unspecified: Secondary | ICD-10-CM | POA: Insufficient documentation

## 2017-09-11 DIAGNOSIS — I429 Cardiomyopathy, unspecified: Secondary | ICD-10-CM | POA: Diagnosis not present

## 2017-09-11 DIAGNOSIS — I361 Nonrheumatic tricuspid (valve) insufficiency: Secondary | ICD-10-CM | POA: Diagnosis not present

## 2017-09-11 DIAGNOSIS — I110 Hypertensive heart disease with heart failure: Secondary | ICD-10-CM | POA: Diagnosis not present

## 2017-09-11 DIAGNOSIS — I5022 Chronic systolic (congestive) heart failure: Secondary | ICD-10-CM | POA: Diagnosis not present

## 2017-09-11 DIAGNOSIS — E119 Type 2 diabetes mellitus without complications: Secondary | ICD-10-CM | POA: Insufficient documentation

## 2017-09-11 MED ORDER — PERFLUTREN LIPID MICROSPHERE
1.0000 mL | INTRAVENOUS | Status: AC | PRN
  Administered 2017-09-11: 2 mL via INTRAVENOUS

## 2017-09-12 LAB — HM DIABETES EYE EXAM

## 2017-09-25 ENCOUNTER — Other Ambulatory Visit: Payer: Self-pay | Admitting: Internal Medicine

## 2017-09-29 ENCOUNTER — Other Ambulatory Visit: Payer: Self-pay | Admitting: Internal Medicine

## 2017-09-29 DIAGNOSIS — K219 Gastro-esophageal reflux disease without esophagitis: Secondary | ICD-10-CM

## 2017-09-29 DIAGNOSIS — R1310 Dysphagia, unspecified: Secondary | ICD-10-CM

## 2017-09-29 DIAGNOSIS — R1319 Other dysphagia: Secondary | ICD-10-CM

## 2017-10-03 ENCOUNTER — Encounter: Payer: Self-pay | Admitting: Cardiovascular Disease

## 2017-10-03 ENCOUNTER — Ambulatory Visit: Payer: Medicare Other | Admitting: Cardiovascular Disease

## 2017-10-03 DIAGNOSIS — I428 Other cardiomyopathies: Secondary | ICD-10-CM | POA: Diagnosis not present

## 2017-10-03 DIAGNOSIS — E782 Mixed hyperlipidemia: Secondary | ICD-10-CM

## 2017-10-03 DIAGNOSIS — I10 Essential (primary) hypertension: Secondary | ICD-10-CM

## 2017-10-03 NOTE — Progress Notes (Signed)
10/03/2017 James Parsons   February 02, 1946  [id number]  Primary Physician Unk Pinto, MD Primary Cardiologist: [name] MD FACP, [place], [place], [place]  HPI:  James Parsons is a 72 y.o.  mildly overweight married Caucasian male father of 2 sons, grandfather and 4 grandchildren who is here in follow-up of nonischemic cardiomyopathy. I last saw him in the office 09/09/2016.  He is accompanied by his wife James Parsons today.James Parsons He is a retired Research officer, trade [employer]. His primary care provider is Dr. [name]. His cardiac risk factor profile is notable for over 100 pack years of tobacco abuse having quit 15 years ago. He has a history of treated hypertension, hyperlipidemia and diabetes. He never had a heart attack or stroke. He was hospitalized in early January of this year with chest pain and CHF. Cardiac catheterization performed by Dr. Martinique revealed noncritical CAD with an EF of 35%. By echo his EF was 40-45%. He is on appropriate medications. He was subsequently admitted after I last saw him on 08/29/16 for 3 days for decompensated heart failure. He was diuresed. A 2-D echo performed during his hospitalization revealed an ejection fraction of 25-30%.  He has seen Dr. Aundra Dubin in the office he was adjusting his medications.  He had ordered a cardiac MRI to further assess his LV function in anticipation of potential Bi V  ICD CRT therapy.  This could not be performed because of an indwelling swelling fentanyl pump however 2D echo performed 09/11/2017 revealed an increase in his EF to the 45 to 50% range obviating the need for an ICD.  He denies chest pain or shortness of breath.     Current Meds  Medication Sig  . aspirin 81 MG chewable tablet Chew 81 mg by mouth at bedtime.  James Parsons atorvastatin (LIPITOR) 80 MG tablet Take 0.5 tablets (40 mg total) by mouth daily at 6 PM.  . carvedilol (COREG) 12.5 MG tablet TAKE 1 TABLET BY MOUTH TWICE A DAY WITH A MEAL  . celecoxib (CELEBREX) 200 MG capsule Take 200 mg by  mouth daily as needed (for headache).   . Cholecalciferol (VITAMIN D-3) 5000 UNITS TABS Take 5,000 Units by mouth 2 (two) times daily.  . cyclobenzaprine (FLEXERIL) 5 MG tablet Take 1 tablet (5 mg total) by mouth 3 (three) times daily as needed for muscle spasms. Muscle spasm.  . DULoxetine (CYMBALTA) 60 MG capsule Take 60 mg by mouth daily.   . folic acid (FOLVITE) 1 MG tablet Take 1 tablet (1 mg total) by mouth daily.  . furosemide (LASIX) 20 MG tablet Take 20 mg by mouth daily.  James Parsons glimepiride (AMARYL) 4 MG tablet TAKE 1 TABLET TWICE DAILY WITH MEALS. DO NOT TAKE IF YOU DO NOT EAT.  James Parsons lidocaine (LIDODERM) 5 % Place 1 patch onto the skin daily as needed (pain). (Patient taking differently: Place 1 patch onto the skin daily as needed (for pain). )  . metFORMIN (GLUCOPHAGE-XR) 500 MG 24 hr tablet TAKE 2 TABLETS 2 X / DAY FOR DIABETES  . metoCLOPramide (REGLAN) 10 MG tablet TAKE 1 TABLET BY MOUTH 3 TIMES A DAY BEFORE MEALS  . pantoprazole (PROTONIX) 40 MG tablet TAKE 1 TABLET (40 MG TOTAL) BY MOUTH 2 (TWO) TIMES DAILY BEFORE A MEAL.  . pregabalin (LYRICA) 150 MG capsule Take 150 mg by mouth 2 (two) times daily.  James Parsons PRESCRIPTION MEDICATION by Intrathecal route continuous. Fentanyl 2mg /ml solution . Daily dose 0.8820 mg Compounded at [facility], [place] [phone]  Pump is refilled monthly  . ranitidine (ZANTAC) 300 MG tablet TAKE 1 TABLET BY MOUTH EVERYDAY AT BEDTIME  . sacubitril-valsartan (ENTRESTO) 49-51 MG Take 1 tablet by mouth 2 (two) times daily.  James Parsons spironolactone (ALDACTONE) 25 MG tablet Take 1 tablet (25 mg total) by mouth at bedtime.  . tamsulosin (FLOMAX) 0.4 MG CAPS capsule TAKE ONE CAPSULE BY MOUTH EVERY DAY AFTER SUPPER (Patient taking differently: TAKE 0.4 MG BY MOUTH EVERY DAY AFTER SUPPER)  . Tapentadol HCl (NUCYNTA) 100 MG TABS Take 100 mg by mouth See admin instructions. Take 100 mg by mouth twice daily, may also take 100 mg extra during the day as needed for pain    . [DISCONTINUED] methotrexate (RHEUMATREX) 2.5 MG tablet Take 15 mg by mouth every Friday.      Allergies  Allergen Reactions  . Codeine Itching  . Morphine And Related Other (See Comments)    hallucinations    Social History   Socioeconomic History  . Marital status: Married    Spouse name: karen  . Number of children: 2  . Years of education: Not on file  . Highest education level: Not on file  Occupational History  . Occupation: retired Management consultant  Social Needs  . Financial resource strain: Not on file  . Food insecurity:    Worry: Not on file    Inability: Not on file  . Transportation needs:    Medical: Not on file    Non-medical: Not on file  Tobacco Use  . Smoking status: Former Smoker    Packs/day: 3.00    Years: 40.00    Pack years: 120.00    Types: Cigarettes    Last attempt to quit: 05/25/2002    Years since quitting: 15.3  . Smokeless tobacco: Never Used  Substance and Sexual Activity  . Alcohol use: No  . Drug use: No  . Sexual activity: Never  Lifestyle  . Physical activity:    Days per week: Not on file    Minutes per session: Not on file  . Stress: Not on file  Relationships  . Social connections:    Talks on phone: Not on file    Gets together: Not on file    Attends religious service: Not on file    Active member of club or organization: Not on file    Attends meetings of clubs or organizations: Not on file    Relationship status: Not on file  . Intimate partner violence:    Fear of current or ex partner: Not on file    Emotionally abused: Not on file    Physically abused: Not on file    Forced sexual activity: Not on file  Other Topics Concern  . Not on file  Social History Narrative  . Not on file     Review of Systems: General: negative for chills, fever, night sweats or weight changes.  Cardiovascular: negative for chest pain, dyspnea on exertion, edema, orthopnea, palpitations, paroxysmal nocturnal dyspnea or  shortness of breath Dermatological: negative for rash Respiratory: negative for cough or wheezing Urologic: negative for hematuria Abdominal: negative for nausea, vomiting, diarrhea, bright red blood per rectum, melena, or hematemesis Neurologic: negative for visual changes, syncope, or dizziness All other systems reviewed and are otherwise negative except as noted above.    Blood pressure 112/64, pulse 95, height 5' 7.5" (1.715 m), weight 204 lb (92.5 kg).  General appearance: alert and no distress Neck: no adenopathy, no carotid bruit, no JVD,  supple, symmetrical, trachea midline and thyroid not enlarged, symmetric, no tenderness/mass/nodules Lungs: clear to auscultation bilaterally Heart: regular rate and rhythm, S1, S2 normal, no murmur, click, rub or gallop Extremities: extremities normal, atraumatic, no cyanosis or edema Pulses: 2+ and symmetric Skin: Skin color, texture, turgor normal. No rashes or lesions Neurologic: Alert and oriented X 3, normal strength and tone. Normal symmetric reflexes. Normal coordination and gait  EKG not performed today  ASSESSMENT AND PLAN:   Mixed hyperlipidemia History of hyperlipidemia on statin therapy lipid profile performed 08/14/2017 revealing total cholesterol 147, HDL of 44  Essential hypertension History of essential hypertension her blood pressure measured today at 112/64.  She is on carvedilol and Entresto.  Non-ischemic cardiomyopathy ([place]) History of nonischemic cardia myopathy with catheterization demonstrating noncritical CAD and recent EF of 45% improved from prior echoes on appropriate medications with class I-II symptoms at most.      [name] MD [facility], [facility] 10/03/2017 3:00 PM

## 2017-10-03 NOTE — Assessment & Plan Note (Signed)
History of hyperlipidemia on statin therapy lipid profile performed 08/14/2017 revealing total cholesterol 147, HDL of 44

## 2017-10-03 NOTE — Assessment & Plan Note (Signed)
History of nonischemic cardia myopathy with catheterization demonstrating noncritical CAD and recent EF of 45% improved from prior echoes on appropriate medications with class I-II symptoms at most.

## 2017-10-03 NOTE — Assessment & Plan Note (Signed)
History of essential hypertension her blood pressure measured today at 112/64.  She is on carvedilol and Entresto.

## 2017-10-03 NOTE — Patient Instructions (Signed)
Your physician wants you to follow-up in: ONE YEAR WITH DR BERRY You will receive a reminder letter in the mail two months in advance. If you don't receive a letter, please call our office to schedule the follow-up appointment.   If you need a refill on your cardiac medications before your next appointment, please call your pharmacy.  

## 2017-10-23 ENCOUNTER — Encounter (HOSPITAL_COMMUNITY): Payer: Medicare Other | Admitting: Cardiology

## 2017-11-09 ENCOUNTER — Encounter: Payer: Self-pay | Admitting: Internal Medicine

## 2017-11-20 ENCOUNTER — Ambulatory Visit: Payer: Self-pay | Admitting: Internal Medicine

## 2017-12-06 ENCOUNTER — Other Ambulatory Visit: Payer: Self-pay | Admitting: Internal Medicine

## 2017-12-10 ENCOUNTER — Other Ambulatory Visit: Payer: Self-pay | Admitting: Internal Medicine

## 2017-12-13 ENCOUNTER — Encounter: Payer: Self-pay | Admitting: Internal Medicine

## 2017-12-13 ENCOUNTER — Ambulatory Visit: Payer: Medicare Other | Admitting: Internal Medicine

## 2017-12-13 VITALS — BP 100/64 | HR 92 | Temp 97.8°F | Resp 16 | Ht 67.5 in | Wt 191.2 lb

## 2017-12-13 DIAGNOSIS — G8929 Other chronic pain: Secondary | ICD-10-CM

## 2017-12-13 DIAGNOSIS — L570 Actinic keratosis: Secondary | ICD-10-CM

## 2017-12-13 DIAGNOSIS — E559 Vitamin D deficiency, unspecified: Secondary | ICD-10-CM

## 2017-12-13 DIAGNOSIS — K219 Gastro-esophageal reflux disease without esophagitis: Secondary | ICD-10-CM

## 2017-12-13 DIAGNOSIS — I5042 Chronic combined systolic (congestive) and diastolic (congestive) heart failure: Secondary | ICD-10-CM

## 2017-12-13 DIAGNOSIS — E782 Mixed hyperlipidemia: Secondary | ICD-10-CM

## 2017-12-13 DIAGNOSIS — I428 Other cardiomyopathies: Secondary | ICD-10-CM

## 2017-12-13 DIAGNOSIS — Z79899 Other long term (current) drug therapy: Secondary | ICD-10-CM

## 2017-12-13 DIAGNOSIS — M545 Low back pain: Secondary | ICD-10-CM

## 2017-12-13 DIAGNOSIS — E1122 Type 2 diabetes mellitus with diabetic chronic kidney disease: Secondary | ICD-10-CM

## 2017-12-13 DIAGNOSIS — N182 Chronic kidney disease, stage 2 (mild): Secondary | ICD-10-CM

## 2017-12-13 DIAGNOSIS — G9050 Complex regional pain syndrome I, unspecified: Secondary | ICD-10-CM

## 2017-12-13 DIAGNOSIS — I10 Essential (primary) hypertension: Secondary | ICD-10-CM

## 2017-12-13 NOTE — Patient Instructions (Signed)

## 2017-12-13 NOTE — Progress Notes (Signed)
This very nice 72 y.o. MWM presents for 6 month follow up with HTN, HLD, Pre-Diabetes and Vitamin D Deficiency.      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 Patient had last Lumbar Fusion in Oct 2016 by Dr Rolena Infante. 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 Spinal cord stimulator  Was implanted in 2005 but apparently is not used for lack of effectiveness.      Patient is treated for HTN (2010) & BP has been controlled at home. Today's BP is at goal - 100/64.  In Jan 2018, he has hospitalized with scute on chronic CHF with cath showing non obstructive CAS and non ischemic cardiomyopathy. Patient is followed at the heart failure clinic. Patient leads a fairly sedentary lifestyle and does endorse DOE with minimal activity. Patient has had no complaints of any cardiac type chest pain, palpitations, dyspnea / orthopnea / PND, dizziness, claudication, or dependent edema.     Hyperlipidemia is controlled with diet & meds. Patient denies myalgias or other med SE's. Last Lipids were at goal; Lab Results  Component Value Date   CHOL 147 08/14/2017   HDL 44 08/14/2017   LDLCALC 82 08/14/2017   LDLDIRECT 178.6 09/08/2009   TRIG 118 08/14/2017   CHOLHDL 3.3 08/14/2017      Also, the patient has history of T2_NIDDM (2007) and has had no symptoms of reactive hypoglycemia, diabetic polys, paresthesias or visual blurring.  Last A1c was not at goal: Lab Results  Component Value Date   HGBA1C 7.4 (H) 08/14/2017      Further, the patient also has history of Vitamin D Deficiency ("41"/2016 on Tx) and supplements vitamin D without any suspected side-effects. Last vitamin D was nearer goal (70-100): Lab Results  Component Value Date   VD25OH 57 05/11/2017   Current Outpatient Medications on File Prior to Visit  Medication Sig  . aspirin 81 MG chewable tablet Chew 81 mg by mouth at bedtime.  [name]  atorvastatin (LIPITOR) 80 MG tablet Take 0.5 tablets (40 mg total) by mouth daily at 6 PM.  . celecoxib (CELEBREX) 200 MG capsule Take 200 mg by mouth daily as needed (for headache).   . Cholecalciferol (VITAMIN D-3) 5000 UNITS TABS Take 5,000 Units by mouth 2 (two) times daily.  . cyclobenzaprine (FLEXERIL) 5 MG tablet Take 1 tablet (5 mg total) by mouth 3 (three) times daily as needed for muscle spasms. Muscle spasm.  . DULoxetine (CYMBALTA) 60 MG capsule Take 60 mg by mouth daily.   . folic acid (FOLVITE) 1 MG tablet Take 1 tablet (1 mg total) by mouth daily.  [name] glimepiride (AMARYL) 4 MG tablet TAKE 1 TABLET TWICE DAILY WITH MEALS. DO NOT TAKE IF YOU DO NOT EAT.  [name] lidocaine (LIDODERM) 5 % Place 1 patch onto the skin daily as needed (pain). (Patient taking differently: Place 1 patch onto the skin daily as needed (for pain). )  . metFORMIN (GLUCOPHAGE-XR) 500 MG 24 hr tablet TAKE 2 TABLETS 2 X / DAY FOR DIABETES  . pregabalin (LYRICA) 150 MG capsule Take 150 mg by mouth 2 (two) times daily.  [name] PRESCRIPTION MEDICATION by Intrathecal route continuous. Fentanyl 2mg /ml solution . Daily dose 0.8820 mg Compounded at [facility] [phone] Pump is refilled monthly  . ranitidine (ZANTAC) 300 MG tablet TAKE 1 TABLET BY MOUTH EVERYDAY AT BEDTIME  .  sacubitril-valsartan (ENTRESTO) 49-51 MG Take 1 tablet by mouth 2 (two) times daily.  [name] spironolactone (ALDACTONE) 25 MG tablet Take 1 tablet (25 mg total) by mouth at bedtime.  . tamsulosin (FLOMAX) 0.4 MG CAPS capsule TAKE ONE CAPSULE BY MOUTH EVERY DAY AFTER SUPPER (Patient taking differently: TAKE 0.4 MG BY MOUTH EVERY DAY AFTER SUPPER)  . Tapentadol HCl (NUCYNTA) 100 MG TABS Take 100 mg by mouth See admin instructions. Take 100 mg by mouth twice daily, may also take 100 mg extra during the day as needed for pain  . carvedilol (COREG) 12.5 MG tablet TAKE 1 TABLET BY MOUTH TWICE A DAY WITH A MEAL (Patient not taking: Reported on  12/13/2017)  . furosemide (LASIX) 20 MG tablet Take 20 mg by mouth daily.  . metoCLOPramide (REGLAN) 10 MG tablet TAKE 1 TABLET BY MOUTH 3 TIMES A DAY BEFORE MEALS (Patient not taking: Reported on 12/13/2017)  . pantoprazole (PROTONIX) 40 MG tablet TAKE 1 TABLET (40 MG TOTAL) BY MOUTH 2 (TWO) TIMES DAILY BEFORE A MEAL. (Patient not taking: Reported on 12/13/2017)   No current facility-administered medications on file prior to visit.    Allergies  Allergen Reactions  . Codeine Itching  . Morphine And Related Other (See Comments)    hallucinations   PMHx:   Past Medical History:  Diagnosis Date  . Allergy   . Anemia   . Arthritis    right hand  . Borderline hypertension   . CHF (congestive heart failure) ([place])    pt denies  . Clausterphobic   . Clotting disorder (HCC)    bleeds easily-no dx  . COPD (chronic obstructive pulmonary disease) ([place])    denies  . Depression    hx of   . Diabetes mellitus    type 2  . Diverticulosis of colon   . Full dentures   . GERD (gastroesophageal reflux disease)    pt denoes  . Hx of colonic polyps   . Hyperlipidemia   . Hypertension   . Lipoma   . Low back pain   . Mental disorder   . Obesity   . Pneumonia    hx  . RSD (reflex sympathetic dystrophy)   . Venous insufficiency    Immunization History  Administered Date(s) Administered  . Influenza Split 04/22/2011, 01/20/2012  . Influenza Whole 01/15/2007, 01/21/2009, 03/08/2010  . Influenza, High Dose Seasonal PF 02/07/2013, 12/10/2015  . Influenza-Unspecified 12/10/2013, 01/10/2015, 12/08/2016  . Pneumococcal Conjugate-13 06/09/2015  . Pneumococcal Polysaccharide-23 04/22/2011  . Tdap 04/11/2001, 05/07/2013   Past Surgical History:  Procedure Laterality Date  . BACK SURGERY  2005,2007  . BALLOON DILATION N/A 06/06/2017   Procedure: BALLOON DILATION;  Surgeon: Irene Shipper, MD;  Location: [facility];  Service: Endoscopy;  Laterality: N/A;  . C3-4 anterior cervical discectomy and  fusion with plating at c3-4  05/2006   Dr. Arnoldo Morale  . CARDIAC CATHETERIZATION N/A 04/25/2016   Procedure: Right/Left Heart Cath and Coronary Angiography;  Surgeon: Peter M Martinique, MD;  Location: [facility];  Service: Cardiovascular;  Laterality: N/A;  . COLONOSCOPY    . COLONOSCOPY W/ POLYPECTOMY    . ESOPHAGOGASTRODUODENOSCOPY (EGD) WITH PROPOFOL N/A 06/06/2017   Procedure: ESOPHAGOGASTRODUODENOSCOPY (EGD) WITH PROPOFOL;  Surgeon: Irene Shipper, MD;  Location: [facility];  Service: Endoscopy;  Laterality: N/A;  . excision of lipoma from right olecranon area  11/2000   Dr. Rise [name]  . KNEE ARTHROSCOPY     right knee  . KNEE ARTHROSCOPY  Right 06/06/2014   Procedure: ARTHROSCOPY RIGHT KNEE WITH REMOVAL OF FIBROUS BANDS;  Surgeon: Kerin Salen, MD;  Location: [facility];  Service: Orthopedics;  Laterality: Right;  . LUMBAR LAMINECTOMY/DECOMPRESSION MICRODISCECTOMY N/A 01/08/2014   Procedure: L4-S1 Decompression with removal and reimplantation of spinal cord stimulator battery ;  Surgeon: Melina Schools, MD;  Location: [facility];  Service: Orthopedics;  Laterality: N/A;  . microdiscectomy and decompression  05/2002   Dr. Tonita Cong  . morphine pump  2009   due to reaction to morphine/changed to fentanyl  . PAIN PUMP IMPLANTATION     with fentanyl  . spinal cord stimulator implanted     for pain per Dr. Maryruth Eve  . subcut pain pump implanted    . TONSILLECTOMY    . TOTAL KNEE ARTHROPLASTY  02/29/2012   Procedure: TOTAL KNEE ARTHROPLASTY;  Surgeon: Kerin Salen, MD;  Location: [facility];  Service: Orthopedics;  Laterality: Right;   FHx:    Reviewed / unchanged  SHx:    Reviewed / unchanged   Systems Review:  Constitutional: Denies fever, chills, wt changes, headaches, insomnia, fatigue, night sweats, change in appetite. Eyes: Denies redness, blurred vision, diplopia, discharge, itchy, watery eyes.  ENT: Denies discharge, congestion, post nasal drip, epistaxis, sore throat, earache,  hearing loss, dental pain, tinnitus, vertigo, sinus pain, snoring.  CV: Denies chest pain, palpitations, irregular heartbeat, syncope, dyspnea, diaphoresis, orthopnea, PND, claudication or edema. Respiratory: denies cough, dyspnea, DOE, pleurisy, hoarseness, laryngitis, wheezing.  Gastrointestinal: Denies dysphagia, odynophagia, heartburn, reflux, water brash, abdominal pain or cramps, nausea, vomiting, bloating, diarrhea, constipation, hematemesis, melena, hematochezia  or hemorrhoids. Genitourinary: Denies dysuria, frequency, urgency, nocturia, hesitancy, discharge, hematuria or flank pain. Musculoskeletal: Denies arthralgias, myalgias, stiffness, jt. swelling, pain, limping or strain/sprain.  Skin: Denies pruritus, rash, hives, warts, acne, eczema or change in skin lesion(s). Neuro: No weakness, tremor, incoordination, spasms, paresthesia or pain. Psychiatric: Denies confusion, memory loss or sensory loss. Endo: Denies change in weight, skin or hair change.  Heme/Lymph: No excessive bleeding, bruising or enlarged lymph nodes.  Physical Exam  BP 100/64   Pulse 92   Temp 97.8 F (36.6 C)   Resp 16   Ht 5' 7.5" (1.715 m)   Wt 191 lb 3.2 oz (86.7 kg)   BMI 29.50 kg/m   Appears  well nourished, well groomed  and in no distress.  Eyes: PERRLA, EOMs, conjunctiva no swelling or erythema. Sinuses: No frontal/maxillary tenderness ENT/Mouth: EAC's clear, TM's nl w/o erythema, bulging. Nares clear w/o erythema, swelling, exudates. Oropharynx clear without erythema or exudates. Oral hygiene is good. Tongue normal, non obstructing. Hearing intact.  Neck: Supple. Thyroid not palpable. Car 2+/2+ without bruits, nodes or JVD. Chest: Respirations nl with BS clear & equal w/o rales, rhonchi, wheezing or stridor.  Cor: Heart sounds normal w/ regular rate and rhythm without sig. murmurs, gallops, clicks or rubs. Peripheral pulses normal and equal  without edema.  Abdomen: Soft & bowel sounds normal.  Non-tender w/o guarding, rebound, hernias, masses or organomegaly.  Lymphatics: Unremarkable.  Musculoskeletal: Full ROM all peripheral extremities, joint stability, 5/5 strength and normal gait.  Skin: Warm, dry without exposed rashes, lesions or ecchymosis apparent.  Neuro: Cranial nerves intact, reflexes equal bilaterally. Sensory-motor testing grossly intact. Tendon reflexes grossly intact.  Pysch: Alert & oriented x 3.  Insight and judgement nl & appropriate. No ideations.  Assessment and Plan:  1. Essential hypertension  - Continue medication, monitor blood pressure at home.  - Continue DASH diet.  Reminder to go to the ER if any CP,  SOB, nausea, dizziness, severe HA, changes vision/speech.  - CBC with Differential/Platelet - COMPLETE METABOLIC PANEL WITH GFR - Magnesium - TSH  2. Hyperlipidemia, mixed  - Continue diet/meds, exercise,& lifestyle modifications.  - Continue monitor periodic cholesterol/liver & renal functions   - Lipid panel - TSH  3. Type 2 diabetes mellitus with stage 2 chronic kidney disease, without long-term current use of insulin (HCC)  - Continue diet, exercise, lifestyle modifications.  - Monitor appropriate labs.  - Hemoglobin A1c - Insulin, random  4. Vitamin D deficiency  - Continue supplementation.   - VITAMIN D 25 Hydroxyl  5. Non-ischemic cardiomyopathy ([place])   6. Chronic combined systolic and diastolic HF (heart failure) (HCC)  - Brain natriuretic peptide  7. RSD (reflex sympathetic dystrophy)  8. Chronic bilateral low back pain without sciatica  9. Gastroesophageal reflux disease  - CBC with Differential/Platelet  10. Medication management  - CBC with Differential/Platelet - COMPLETE METABOLIC PANEL WITH GFR - Magnesium - Lipid panel - TSH - Hemoglobin A1c - Insulin, random - VITAMIN D 25 Hydroxyl - Brain natriuretic peptide       Discussed  regular exercise, BP monitoring, weight control to achieve/maintain  BMI less than 25 and discussed med and SE's. Recommended labs to assess and monitor clinical status with further disposition pending results of labs. Over 30 minutes of exam, counseling, chart review was performed.

## 2017-12-14 LAB — CBC WITH DIFFERENTIAL/PLATELET
BASOS PCT: 0.4 %
Basophils Absolute: 32 cells/uL (ref 0–200)
Eosinophils Absolute: 154 cells/uL (ref 15–500)
Eosinophils Relative: 1.9 %
HCT: 42.3 % (ref 38.5–50.0)
HEMOGLOBIN: 13.9 g/dL (ref 13.2–17.1)
Lymphs Abs: 2325 cells/uL (ref 850–3900)
MCH: 29.4 pg (ref 27.0–33.0)
MCHC: 32.9 g/dL (ref 32.0–36.0)
MCV: 89.4 fL (ref 80.0–100.0)
MONOS PCT: 5.4 %
MPV: 12.2 fL (ref 7.5–12.5)
NEUTROS ABS: 5152 {cells}/uL (ref 1500–7800)
Neutrophils Relative %: 63.6 %
PLATELETS: 223 10*3/uL (ref 140–400)
RBC: 4.73 10*6/uL (ref 4.20–5.80)
RDW: 12.6 % (ref 11.0–15.0)
TOTAL LYMPHOCYTE: 28.7 %
WBC: 8.1 10*3/uL (ref 3.8–10.8)
WBCMIX: 437 {cells}/uL (ref 200–950)

## 2017-12-14 LAB — VITAMIN D 25 HYDROXY (VIT D DEFICIENCY, FRACTURES): VIT D 25 HYDROXY: 59 ng/mL (ref 30–100)

## 2017-12-14 LAB — COMPLETE METABOLIC PANEL WITH GFR
AG Ratio: 1.6 (calc) (ref 1.0–2.5)
ALBUMIN MSPROF: 4.4 g/dL (ref 3.6–5.1)
ALKALINE PHOSPHATASE (APISO): 88 U/L (ref 40–115)
ALT: 8 U/L — ABNORMAL LOW (ref 9–46)
AST: 11 U/L (ref 10–35)
BILIRUBIN TOTAL: 0.7 mg/dL (ref 0.2–1.2)
BUN: 9 mg/dL (ref 7–25)
CHLORIDE: 97 mmol/L — AB (ref 98–110)
CO2: 28 mmol/L (ref 20–32)
Calcium: 10.1 mg/dL (ref 8.6–10.3)
Creat: 0.93 mg/dL (ref 0.70–1.18)
GFR, Est African American: 95 mL/min/{1.73_m2} (ref >=60)
GFR, Est Non African American: 82 mL/min/{1.73_m2} (ref >=60)
GLOBULIN: 2.8 g/dL (ref 1.9–3.7)
Glucose, Bld: 417 mg/dL — ABNORMAL HIGH (ref 65–99)
Potassium: 5 mmol/L (ref 3.5–5.3)
SODIUM: 138 mmol/L (ref 135–146)
Total Protein: 7.2 g/dL (ref 6.1–8.1)

## 2017-12-14 LAB — HEMOGLOBIN A1C
EAG (MMOL/L): 18.4 (calc)
HEMOGLOBIN A1C: 13.2 %{Hb} — AB (ref <5.7)
Mean Plasma Glucose: 332 (calc)

## 2017-12-14 LAB — INSULIN, RANDOM: INSULIN: 12.4 u[IU]/mL (ref 2.0–19.6)

## 2017-12-14 LAB — LIPID PANEL
CHOLESTEROL: 132 mg/dL (ref <200)
HDL: 37 mg/dL — ABNORMAL LOW (ref >40)
LDL Cholesterol (Calc): 69 mg/dL (calc)
Non-HDL Cholesterol (Calc): 95 mg/dL (calc) (ref <130)
Total CHOL/HDL Ratio: 3.6 (calc) (ref <5.0)
Triglycerides: 191 mg/dL — ABNORMAL HIGH (ref <150)

## 2017-12-14 LAB — BRAIN NATRIURETIC PEPTIDE: BRAIN NATRIURETIC PEPTIDE: 21 pg/mL (ref <100)

## 2017-12-14 LAB — TSH: TSH: 1.44 m[IU]/L (ref 0.40–4.50)

## 2017-12-14 LAB — MAGNESIUM: MAGNESIUM: 1.5 mg/dL (ref 1.5–2.5)

## 2017-12-18 ENCOUNTER — Other Ambulatory Visit: Payer: Self-pay

## 2017-12-18 ENCOUNTER — Ambulatory Visit (HOSPITAL_COMMUNITY)
Admission: RE | Admit: 2017-12-18 | Discharge: 2017-12-18 | Disposition: A | Discharge status: Home / Self Care | Payer: Medicare Other | Source: Ambulatory Visit | Attending: Cardiology | Admitting: Cardiology

## 2017-12-18 ENCOUNTER — Encounter (HOSPITAL_COMMUNITY): Payer: Self-pay | Admitting: Cardiology

## 2017-12-18 VITALS — BP 133/55 | HR 79 | Wt 195.2 lb

## 2017-12-18 DIAGNOSIS — Z79899 Other long term (current) drug therapy: Secondary | ICD-10-CM | POA: Insufficient documentation

## 2017-12-18 DIAGNOSIS — I428 Other cardiomyopathies: Secondary | ICD-10-CM | POA: Insufficient documentation

## 2017-12-18 DIAGNOSIS — E782 Mixed hyperlipidemia: Secondary | ICD-10-CM | POA: Insufficient documentation

## 2017-12-18 DIAGNOSIS — I5022 Chronic systolic (congestive) heart failure: Secondary | ICD-10-CM | POA: Diagnosis present

## 2017-12-18 DIAGNOSIS — Z7982 Long term (current) use of aspirin: Secondary | ICD-10-CM | POA: Diagnosis not present

## 2017-12-18 DIAGNOSIS — I2510 Atherosclerotic heart disease of native coronary artery without angina pectoris: Secondary | ICD-10-CM | POA: Diagnosis not present

## 2017-12-18 DIAGNOSIS — Z87891 Personal history of nicotine dependence: Secondary | ICD-10-CM | POA: Diagnosis not present

## 2017-12-18 DIAGNOSIS — Z7984 Long term (current) use of oral hypoglycemic drugs: Secondary | ICD-10-CM | POA: Insufficient documentation

## 2017-12-18 DIAGNOSIS — Z885 Allergy status to narcotic agent status: Secondary | ICD-10-CM | POA: Diagnosis not present

## 2017-12-18 DIAGNOSIS — I447 Left bundle-branch block, unspecified: Secondary | ICD-10-CM | POA: Diagnosis not present

## 2017-12-18 DIAGNOSIS — G8929 Other chronic pain: Secondary | ICD-10-CM | POA: Insufficient documentation

## 2017-12-18 DIAGNOSIS — E119 Type 2 diabetes mellitus without complications: Secondary | ICD-10-CM | POA: Diagnosis not present

## 2017-12-18 DIAGNOSIS — I110 Hypertensive heart disease with heart failure: Secondary | ICD-10-CM | POA: Insufficient documentation

## 2017-12-18 DIAGNOSIS — M549 Dorsalgia, unspecified: Secondary | ICD-10-CM | POA: Diagnosis not present

## 2017-12-18 LAB — BASIC METABOLIC PANEL
Anion gap: 9 (ref 5–15)
BUN: 7 mg/dL — AB (ref 8–23)
CHLORIDE: 102 mmol/L (ref 98–111)
CO2: 25 mmol/L (ref 22–32)
Calcium: 9 mg/dL (ref 8.9–10.3)
Creatinine, Ser: 0.74 mg/dL (ref 0.61–1.24)
GFR calc Af Amer: >60 mL/min (ref >60)
GFR calc non Af Amer: >60 mL/min (ref >60)
GLUCOSE: 330 mg/dL — AB (ref 70–99)
Potassium: 4.5 mmol/L (ref 3.5–5.1)
SODIUM: 136 mmol/L (ref 135–145)

## 2017-12-18 MED ORDER — EMPAGLIFLOZIN 10 MG PO TABS: 10.0000 mg | ORAL_TABLET | Freq: Every day | ORAL | 6 refills | Status: DC

## 2017-12-18 NOTE — Patient Instructions (Signed)
Start Jardiance 10 mg daily  Lab today  We will contact you in 4 months to schedule your next appointment.

## 2017-12-18 NOTE — Progress Notes (Signed)
Advanced Heart Failure Clinic Note   Primary Care: Dr. [name] Primary Cardiologist: Dr. Gwenlyn Found HF Cardiology: Dr. Aundra Dubin  HPI:  James Parsons is a 72 y.o. male with PMH of chronic systolic CHF with EF 70-96% (NICM), non-critical CAD, HTN, mixed HLD, DM2, and chronic back pain with spinal stimulator and fentanyl pump.    Admitted 5/21 -> 09/01/16 with acute SOB. Diuresed with IV lasix and discharged on po lasix 40 mg daily (Increase from his chronic dose).  He initially desaturated with ambulation on RA, but ultimately did not require 02 with distances of up to 400 feet. Discharge weight 183 lbs.  Echo in 12/18 with EF 35%.  He was unable to get a cardiac MRI due to his Fentanyl pump.  Repeat echo in 6/19 showed EF up to 45-50% with moderate LVH.    Returns today for followup of CHF.  Still limited by back pain, follows at a pain clinic.  Blood glucose has been poorly controlled with HgbA1c recently 13.2%. He does not have significant exertional dyspnea though he is not particularly active.  No chest pain.  No orthopnea/PND.  He is not taking Lasix currently.  Weight is down 11 lbs.     ECG (personally reviewed): NSR, LBBB (158 msec).   Labs (10/18): K 4.3, creatinine 0.84, LDL 81, HDL 40 Labs (1/19): LDL 76, HDL 41, K ?6, creatinine 0.82, hgb 12 Labs (5/19): K 5.1, creatinine 0.89, LDL 82, HDL 44 Labs (9/19): K 5, creatinine 0.93, BNP 21, LDL 69, HDL 37, hgbA1c 13.2%  Social History: 2 sons, 4 grandchildren. Retired Engineer, structural. Former smoker with > 100 pack years of tobacco abuse.  Stopped smoking in early 2000s. Has been married for 47 years this year.   Past Medical History 1. Chronic systolic CHF: Nonischemic cardiomyopathy.  Possible LBBB cardiomyopathy or viral myocarditis.  - LHC/RHC (1/18): Nonobstructive CAD.  Mean RA 7, PA 33/17, mean PCWP 16, CI 2.61.  - Echo (5/18): EF 25-30% - Echo (12/18): EF 35%, septal-lateral dyssynchrony with septal hypokinesis, normal RV size and  systolic function, PASP 42 mmHg.  - Echo (6/19): EF 45-50%, moderate LVH.  2. 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    3. HTN 4. Hyperlipidemia 5. DM2 6. Chronic back pain with fentanyl pump.   7. LBBB: Chronic.    Review of systems complete and found to be negative unless listed in HPI.    Family History  Problem Relation Age of Onset  . Cancer Father   . Colon cancer Neg Hx   . Esophageal cancer Neg Hx   . Stomach cancer Neg Hx   . Rectal cancer Neg Hx     Current Outpatient Medications  Medication Sig Dispense Refill  . aspirin 81 MG chewable tablet Chew 81 mg by mouth at bedtime.    [name] atorvastatin (LIPITOR) 80 MG tablet Take 0.5 tablets (40 mg total) by mouth daily at 6 PM. 90 tablet 1  . carvedilol (COREG) 12.5 MG tablet TAKE 1 TABLET BY MOUTH TWICE A DAY WITH A MEAL 30 tablet 3  . celecoxib (CELEBREX) 200 MG capsule Take 200 mg by mouth daily as needed (for headache).     . Cholecalciferol (VITAMIN D-3) 5000 UNITS TABS Take 5,000 Units by mouth 2 (two) times daily.    . cyclobenzaprine (FLEXERIL) 5 MG tablet Take 1 tablet (5 mg total) by mouth 3 (three) times daily as needed for muscle spasms. Muscle spasm.    . DULoxetine (CYMBALTA) 60 MG capsule  Take 60 mg by mouth daily.     . folic acid (FOLVITE) 1 MG tablet Take 1 tablet (1 mg total) by mouth daily. 30 tablet 3  . glimepiride (AMARYL) 4 MG tablet TAKE 1 TABLET TWICE DAILY WITH MEALS. DO NOT TAKE IF YOU DO NOT EAT. 180 tablet 1  . lidocaine (LIDODERM) 5 % Place 1 patch onto the skin daily as needed (pain).    . metFORMIN (GLUCOPHAGE-XR) 500 MG 24 hr tablet TAKE 2 TABLETS 2 X / DAY FOR DIABETES 360 tablet 3  . pregabalin (LYRICA) 150 MG capsule Take 150 mg by mouth 2 (two) times daily.    [name] PRESCRIPTION MEDICATION by Intrathecal route continuous. Fentanyl 2mg /ml solution . Daily dose 0.8820 mg Compounded at [facility] [phone] Pump is refilled monthly    . ranitidine (ZANTAC) 300 MG tablet TAKE 1  TABLET BY MOUTH EVERYDAY AT BEDTIME 90 tablet 1  . sacubitril-valsartan (ENTRESTO) 49-51 MG Take 1 tablet by mouth 2 (two) times daily. 180 tablet 3  . spironolactone (ALDACTONE) 25 MG tablet Take 1 tablet (25 mg total) by mouth at bedtime. 90 tablet 3  . tamsulosin (FLOMAX) 0.4 MG CAPS capsule TAKE ONE CAPSULE BY MOUTH EVERY DAY AFTER SUPPER (Patient taking differently: TAKE 0.4 MG BY MOUTH EVERY DAY AFTER SUPPER) 90 capsule 1  . Tapentadol HCl (NUCYNTA) 100 MG TABS Take 100 mg by mouth See admin instructions. Take 100 mg by mouth twice daily, may also take 100 mg extra during the day as needed for pain    . empagliflozin (JARDIANCE) 10 MG TABS tablet Take 10 mg by mouth daily. 30 tablet 6   No current facility-administered medications for this encounter.     Allergies  Allergen Reactions  . Codeine Itching  . Morphine And Related Other (See Comments)    hallucinations    Vitals:   12/18/17 1006  BP: (!) 133/55  Pulse: 79  SpO2: 99%  Weight: 88.6 kg (195 lb 4 oz)   Wt Readings from Last 3 Encounters:  12/18/17 88.6 kg (195 lb 4 oz)  12/13/17 86.7 kg (191 lb 3.2 oz)  10/03/17 92.5 kg (204 lb)    PHYSICAL EXAM: General: NAD Neck: No JVD, no thyromegaly or thyroid nodule.  Lungs: Clear to auscultation bilaterally with normal respiratory effort. CV: Nondisplaced PMI.  Heart regular S1/S2, no S3/S4, no murmur.  No peripheral edema.  No carotid bruit.  Normal pedal pulses.  Abdomen: Soft, nontender, no hepatosplenomegaly, no distention.  Skin: Intact without lesions or rashes.  Neurologic: Alert and oriented x 3.  Psych: Normal affect. Extremities: No clubbing or cyanosis.  HEENT: Normal.   ASSESSMENT & PLAN:  1. Chronic systolic CHF: Nonischemic cardiomyopathy. Possible prior viral myocarditis. No family history of cardiomyopathy.  Echo in 12/18 with EF 35% with septal-lateral dyssynchrony, EF up to 45-50% on last echo in 6/19.  On exam today, he is not volume overloaded, NYHA  class II symptoms (more limited by his back).  ECG shows that he has a LBBB again (had narrow QRS at last appointment).   - Continue lasix 20 mg daily. BMET today => If K is high, will consider Veltassa use.  - Continue Entresto 49/51 bid today.    - Continue Coreg 12.5 mg BID.  - Continue Spiro 25 mg daily. - Unable to get cardiac MRI due to Fentanyl pump.  - EF currently too high for CRT-D.  2. CAD: Nonobstructive.   - Continue ASA 81  and statin.   3. Chronic back pain Fentanyl pump.   - Follows with pain management.  4. Diabetes: Uncontrolled.  - I am going to start him on empagliflozin as this improves outcomes in CHF and will give him some hgbA1c lowering.   Followup in  4 months  Loralie Champagne, MD 12/18/17

## 2017-12-20 ENCOUNTER — Encounter: Payer: Self-pay | Admitting: Internal Medicine

## 2017-12-20 ENCOUNTER — Ambulatory Visit: Payer: Medicare Other | Admitting: Internal Medicine

## 2017-12-20 VITALS — BP 94/56 | HR 72 | Temp 97.2°F | Resp 16 | Ht 67.5 in | Wt 196.0 lb

## 2017-12-20 DIAGNOSIS — E1165 Type 2 diabetes mellitus with hyperglycemia: Secondary | ICD-10-CM

## 2017-12-20 DIAGNOSIS — N182 Chronic kidney disease, stage 2 (mild): Secondary | ICD-10-CM

## 2017-12-20 DIAGNOSIS — E1122 Type 2 diabetes mellitus with diabetic chronic kidney disease: Secondary | ICD-10-CM

## 2017-12-20 DIAGNOSIS — E1129 Type 2 diabetes mellitus with other diabetic kidney complication: Secondary | ICD-10-CM | POA: Diagnosis not present

## 2017-12-20 NOTE — Progress Notes (Signed)
Subjective:    Patient ID: James Parsons, male    DOB: [birth year], 72 y.o.   MRN: [id number]  HPI   This nice albeit sl apathetic 72 yo MWM returns for 1 week f/u with recent labs showing elevated Glucose 417 mg% and A1c 13.6% (ave glucose 332 mg%)  allegedly still taking his Metformin on schedule had for no reason stopped his Amaryl and since restarted it. He has been monitoring occasional CBG's in the 200 - 250 mg% range.He denies any diabetic poly's, paresthesias or visual blurring. He saw Dr Aundra Dubin 2 days ago  Who started [facility] for Cardiovascular protection.   Medication Sig  . aspirin 81 MG chewable tablet Chew 81 mg by mouth at bedtime.  [name] atorvastatin (LIPITOR) 80 MG tablet Take 0.5 tablets (40 mg total) by mouth daily at 6 PM.  . carvedilol (COREG) 12.5 MG tablet TAKE 1 TABLET BY MOUTH TWICE A DAY WITH A MEAL  . celecoxib (CELEBREX) 200 MG capsule Take 200 mg by mouth daily as needed (for headache).   . Cholecalciferol (VITAMIN D-3) 5000 UNITS TABS Take 5,000 Units by mouth 2 (two) times daily.  . cyclobenzaprine (FLEXERIL) 5 MG tablet Take 1 tablet (5 mg total) by mouth 3 (three) times daily as needed for muscle spasms. Muscle spasm.  . DULoxetine (CYMBALTA) 60 MG capsule Take 60 mg by mouth daily.   . empagliflozin (JARDIANCE) 10 MG TABS tablet Take 10 mg by mouth daily.  . folic acid (FOLVITE) 1 MG tablet Take 1 tablet (1 mg total) by mouth daily.  [name] glimepiride (AMARYL) 4 MG tablet TAKE 1 TABLET TWICE DAILY WITH MEALS. DO NOT TAKE IF YOU DO NOT EAT.  [name] lidocaine (LIDODERM) 5 % Place 1 patch onto the skin daily as needed (pain).  . metFORMIN (GLUCOPHAGE-XR) 500 MG 24 hr tablet TAKE 2 TABLETS 2 X / DAY FOR DIABETES  . pregabalin (LYRICA) 150 MG capsule Take 150 mg by mouth 2 (two) times daily.  [name] PRESCRIPTION MEDICATION by Intrathecal route continuous. Fentanyl 2mg /ml solution . Daily dose 0.8820 mg Compounded at [facility] [phone] Pump is refilled  monthly  . ranitidine (ZANTAC) 300 MG tablet TAKE 1 TABLET BY MOUTH EVERYDAY AT BEDTIME  . sacubitril-valsartan (ENTRESTO) 49-51 MG Take 1 tablet by mouth 2 (two) times daily.  [name] spironolactone (ALDACTONE) 25 MG tablet Take 1 tablet (25 mg total) by mouth at bedtime.  . tamsulosin (FLOMAX) 0.4 MG CAPS capsule TAKE ONE CAPSULE BY MOUTH EVERY DAY AFTER SUPPER (Patient taking differently: TAKE 0.4 MG BY MOUTH EVERY DAY AFTER SUPPER)  . Tapentadol HCl (NUCYNTA) 100 MG TABS Take 100 mg by mouth See admin instructions. Take 100 mg by mouth twice daily, may also take 100 mg extra during the day as needed for pain   No facility-administered medications prior to visit.     Allergies  Allergen Reactions  . Codeine Itching  . Morphine And Related Other (See Comments)    hallucinations   Past Medical History:  Diagnosis Date  . Allergy   . Anemia   . Arthritis    right hand  . Borderline hypertension   . CHF (congestive heart failure) ([place])    pt denies  . Clausterphobic   . Clotting disorder (HCC)    bleeds easily-no dx  . COPD (chronic obstructive pulmonary disease) ([place])    denies  . Depression    hx of   . Diabetes mellitus    type 2  .  Diverticulosis of colon   . Full dentures   . GERD (gastroesophageal reflux disease)    pt denoes  . Hx of colonic polyps   . Hyperlipidemia   . Hypertension   . Lipoma   . Low back pain   . Mental disorder   . Obesity   . Pneumonia    hx  . RSD (reflex sympathetic dystrophy)   . Venous insufficiency    Past Surgical History:  Procedure Laterality Date  . BACK SURGERY  2005,2007  . BALLOON DILATION N/A 06/06/2017   Procedure: BALLOON DILATION;  Surgeon: Irene Shipper, MD;  Location: [facility];  Service: Endoscopy;  Laterality: N/A;  . C3-4 anterior cervical discectomy and fusion with plating at c3-4  05/2006   Dr. Arnoldo Morale  . CARDIAC CATHETERIZATION N/A 04/25/2016   Procedure: Right/Left Heart Cath and Coronary Angiography;  Surgeon:  Peter M Martinique, MD;  Location: [place] [facility];  Service: Cardiovascular;  Laterality: N/A;  . COLONOSCOPY    . COLONOSCOPY W/ POLYPECTOMY    . ESOPHAGOGASTRODUODENOSCOPY (EGD) WITH PROPOFOL N/A 06/06/2017   Procedure: ESOPHAGOGASTRODUODENOSCOPY (EGD) WITH PROPOFOL;  Surgeon: Irene Shipper, MD;  Location: [facility];  Service: Endoscopy;  Laterality: N/A;  . excision of lipoma from right olecranon area  11/2000   Dr. Rise [name]  . KNEE ARTHROSCOPY     right knee  . KNEE ARTHROSCOPY Right 06/06/2014   Procedure: ARTHROSCOPY RIGHT KNEE WITH REMOVAL OF FIBROUS BANDS;  Surgeon: Kerin Salen, MD;  Location: [place];  Service: Orthopedics;  Laterality: Right;  . LUMBAR LAMINECTOMY/DECOMPRESSION MICRODISCECTOMY N/A 01/08/2014   Procedure: L4-S1 Decompression with removal and reimplantation of spinal cord stimulator battery ;  Surgeon: Melina Schools, MD;  Location: [place];  Service: Orthopedics;  Laterality: N/A;  . microdiscectomy and decompression  05/2002   Dr. Tonita Cong  . morphine pump  2009   due to reaction to morphine/changed to fentanyl  . PAIN PUMP IMPLANTATION     with fentanyl  . spinal cord stimulator implanted     for pain per Dr. Maryruth Eve  . subcut pain pump implanted    . TONSILLECTOMY    . TOTAL KNEE ARTHROPLASTY  02/29/2012   Procedure: TOTAL KNEE ARTHROPLASTY;  Surgeon: Kerin Salen, MD;  Location: [place];  Service: Orthopedics;  Laterality: Right;   Review of Systems   10 point systems review negative except as above.    Objective:   Physical Exam  BP (!) 94/56   Pulse 72   Temp (!) 97.2 F (36.2 C)   Resp 16   Ht 5' 7.5" (1.715 m)   Wt 196 lb (88.9 kg)   BMI 30.24 kg/m   HEENT - WNL. Neck - supple.  Chest - Clear equal BS. Cor - Nl HS. RRR w/o sig MGR. PP 1(+). No edema. MS- FROM w/o deformities.  Gait Nl. Neuro -  Nl w/o focal abnormalities.    Assessment & Plan:   1. Type 2 diabetes mellitus with stage 2 chronic kidney disease, without  long-term current use of insulin ([place])   2. 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  - patient was informed that the Jardiance will only hep lower his glucoses by about 30 mg% points and that he should not count of it to do what he must do by better diet & weight loss to achieve better control of his  sugars. Over 20 minutes of exam, counseling, dietary and Diabetic teaching, chart review and critical decision making was performed.

## 2018-01-04 ENCOUNTER — Telehealth: Payer: Self-pay

## 2018-01-04 ENCOUNTER — Other Ambulatory Visit: Payer: Self-pay

## 2018-01-04 DIAGNOSIS — R1310 Dysphagia, unspecified: Secondary | ICD-10-CM

## 2018-01-04 NOTE — Telephone Encounter (Signed)
-----   Message from Algernon Huxley, RN sent at 12/01/2017 11:48 AM EDT ----- Regarding: FW: Recal EGD    ----- Message ----- From: Algernon Huxley, RN Sent: 11/20/2017 To: Algernon Huxley, RN Subject: Melton Alar: Recal EGD                                    ----- Message ----- From: Algernon Huxley, RN Sent: 11/13/2017 To: Algernon Huxley, RN Subject: Recal EGD                                      Pt needs EGD at hospital

## 2018-01-04 NOTE — Telephone Encounter (Signed)
Called to schedule pt for repeat EGD with dil in Nov with Dr. Henrene Pastor on 02/26/18. Pt is having back surgery on 02/27/18 so this date will not work for him. Wife will call once he has recovered from surgery to schedule EGD.

## 2018-01-18 ENCOUNTER — Encounter: Payer: Self-pay | Admitting: Internal Medicine

## 2018-01-18 ENCOUNTER — Ambulatory Visit: Payer: Medicare Other | Admitting: Internal Medicine

## 2018-01-18 VITALS — BP 80/52 | HR 52 | Temp 97.0°F | Resp 16 | Ht 67.5 in | Wt 192.0 lb

## 2018-01-18 DIAGNOSIS — E1165 Type 2 diabetes mellitus with hyperglycemia: Secondary | ICD-10-CM

## 2018-01-18 DIAGNOSIS — E1129 Type 2 diabetes mellitus with other diabetic kidney complication: Secondary | ICD-10-CM | POA: Diagnosis not present

## 2018-01-18 DIAGNOSIS — I951 Orthostatic hypotension: Secondary | ICD-10-CM

## 2018-01-18 DIAGNOSIS — Z79899 Other long term (current) drug therapy: Secondary | ICD-10-CM | POA: Diagnosis not present

## 2018-01-18 NOTE — Progress Notes (Signed)
This very nice 72 y.o. MWM presents for 1 month follow up with HTN, ASHD/chronic CHF, HLD, Poorly controlled T2_DM and Vitamin D Deficiency. Patient has chronic Pain Syndrome on chronic Opioids and Fentanyl pump (2010)  consequent of DDD s/p lumbar fusion also with hx/o RSD/CRPS of the RLE since initial back surgery in 2010.      Recent labs found DM way out of control with elevated A1c 13.2% as patient had apparently stopped his Amaryl, then restarted again with his Metformin which he has continued to take since last OV. Dr Aundra Dubin also added Vania Rea for Cardiovascular benefit. Patient returns today for review of glucose logs. Patient's DM predates since 2007 & heretofore has been managed with oral agrents.     Patient is treated for HTN & BP has been controlled at home. Today's BP is low at 80/52 by the nurse. Patient has ongoing chronic CHF managed by Dr Aundra Dubin with non-obstructive/non ischemic cardiomyopathy & is followed in the heart failure clinic, patient has recently had Entresto increased.      Hyperlipidemia is controlled with diet & meds. Patient denies myalgias or other med SE's. Last Lipids were  Lab Results  Component Value Date   CHOL 132 12/13/2017   HDL 37 (L) 12/13/2017   LDLCALC 69 12/13/2017   LDLDIRECT 178.6 09/08/2009   TRIG 191 (H) 12/13/2017   CHOLHDL 3.6 12/13/2017      Also, the patient has history of T2_NIDDM (2007) and has had no symptoms of reactive hypoglycemia, diabetic polys, paresthesias or visual blurring.   He reports recent random CBG's range betw 160-170 mg%. Last A1c was not at goal: Lab Results  Component Value Date   HGBA1C 13.2 (H) 12/13/2017      Further, the patient also has history of Vitamin D Deficiency ("41"on Tx/2016 ) and supplements vitamin D without any suspected side-effects. Last vitamin D was at goal: Lab Results  Component Value Date   VD25OH 59 12/13/2017   Current Outpatient Medications on File Prior to Visit  Medication Sig    . aspirin 81 MG chewable tablet Chew 81 mg by mouth at bedtime.  [name] atorvastatin (LIPITOR) 80 MG tablet Take 0.5 tablets (40 mg total) by mouth daily at 6 PM.  . celecoxib (CELEBREX) 200 MG capsule Take 200 mg by mouth daily as needed (for headache).   . Cholecalciferol (VITAMIN D-3) 5000 UNITS TABS Take 5,000 Units by mouth 2 (two) times daily.  . cyclobenzaprine (FLEXERIL) 5 MG tablet Take 1 tablet (5 mg total) by mouth 3 (three) times daily as needed for muscle spasms. Muscle spasm.  . DULoxetine (CYMBALTA) 60 MG capsule Take 60 mg by mouth daily.   . empagliflozin (JARDIANCE) 10 MG TABS tablet Take 10 mg by mouth daily.  . folic acid (FOLVITE) 1 MG tablet Take 1 tablet (1 mg total) by mouth daily.  [name] glimepiride (AMARYL) 4 MG tablet TAKE 1 TABLET TWICE DAILY WITH MEALS. DO NOT TAKE IF YOU DO NOT EAT.  [name] lidocaine (LIDODERM) 5 % Place 1 patch onto the skin daily as needed (pain).  . metFORMIN (GLUCOPHAGE-XR) 500 MG 24 hr tablet TAKE 2 TABLETS 2 X / DAY FOR DIABETES  . pregabalin (LYRICA) 150 MG capsule Take 150 mg by mouth 2 (two) times daily.  [name] PRESCRIPTION MEDICATION by Intrathecal route continuous. Fentanyl 2mg /ml solution . Daily dose 0.8820 mg Compounded at [facility] [phone] Pump is refilled monthly  . ranitidine (ZANTAC) 300 MG  tablet TAKE 1 TABLET BY MOUTH EVERYDAY AT BEDTIME  . sacubitril-valsartan (ENTRESTO) 49-51 MG Take 1 tablet by mouth 2 (two) times daily.  [name] spironolactone (ALDACTONE) 25 MG tablet Take 1 tablet (25 mg total) by mouth at bedtime.  . tamsulosin (FLOMAX) 0.4 MG CAPS capsule TAKE ONE CAPSULE BY MOUTH EVERY DAY AFTER SUPPER (Patient taking differently: TAKE 0.4 MG BY MOUTH EVERY DAY AFTER SUPPER)  . Tapentadol HCl (NUCYNTA) 100 MG TABS Take 100 mg by mouth See admin instructions. Take 100 mg by mouth twice daily, may also take 100 mg extra during the day as needed for pain   No current facility-administered medications on file prior  to visit.    Allergies  Allergen Reactions  . Codeine Itching  . Morphine And Related Other (See Comments)    hallucinations   PMHx:   Past Medical History:  Diagnosis Date  . Allergy   . Anemia   . Arthritis    right hand  . Borderline hypertension   . CHF (congestive heart failure) ([place])    pt denies  . Clausterphobic   . Clotting disorder (HCC)    bleeds easily-no dx  . COPD (chronic obstructive pulmonary disease) ([place])    denies  . Depression    hx of   . Diabetes mellitus    type 2  . Diverticulosis of colon   . Full dentures   . GERD (gastroesophageal reflux disease)    pt denoes  . Hx of colonic polyps   . Hyperlipidemia   . Hypertension   . Lipoma   . Low back pain   . Mental disorder   . Obesity   . Pneumonia    hx  . RSD (reflex sympathetic dystrophy)   . Venous insufficiency    Immunization History  Administered Date(s) Administered  . Influenza Split 04/22/2011, 01/20/2012  . Influenza Whole 01/15/2007, 01/21/2009, 03/08/2010  . Influenza, High Dose Seasonal PF 02/07/2013, 12/10/2015  . Influenza-Unspecified 12/10/2013, 01/10/2015, 12/08/2016  . Pneumococcal Conjugate-13 06/09/2015  . Pneumococcal Polysaccharide-23 04/22/2011  . Tdap 04/11/2001, 05/07/2013   Past Surgical History:  Procedure Laterality Date  . BACK SURGERY  2005,2007  . BALLOON DILATION N/A 06/06/2017   Procedure: BALLOON DILATION;  Surgeon: Irene Shipper, MD;  Location: [facility];  Service: Endoscopy;  Laterality: N/A;  . C3-4 anterior cervical discectomy and fusion with plating at c3-4  05/2006   Dr. Arnoldo Morale  . CARDIAC CATHETERIZATION N/A 04/25/2016   Procedure: Right/Left Heart Cath and Coronary Angiography;  Surgeon: Peter M Martinique, MD;  Location: [place] [facility];  Service: Cardiovascular;  Laterality: N/A;  . COLONOSCOPY    . COLONOSCOPY W/ POLYPECTOMY    . ESOPHAGOGASTRODUODENOSCOPY (EGD) WITH PROPOFOL N/A 06/06/2017   Procedure: ESOPHAGOGASTRODUODENOSCOPY (EGD)  WITH PROPOFOL;  Surgeon: Irene Shipper, MD;  Location: [facility];  Service: Endoscopy;  Laterality: N/A;  . excision of lipoma from right olecranon area  11/2000   Dr. Rise [name]  . KNEE ARTHROSCOPY     right knee  . KNEE ARTHROSCOPY Right 06/06/2014   Procedure: ARTHROSCOPY RIGHT KNEE WITH REMOVAL OF FIBROUS BANDS;  Surgeon: Kerin Salen, MD;  Location: [place];  Service: Orthopedics;  Laterality: Right;  . LUMBAR LAMINECTOMY/DECOMPRESSION MICRODISCECTOMY N/A 01/08/2014   Procedure: L4-S1 Decompression with removal and reimplantation of spinal cord stimulator battery ;  Surgeon: Melina Schools, MD;  Location: [place];  Service: Orthopedics;  Laterality: N/A;  . microdiscectomy and decompression  05/2002   Dr.  Beane  . morphine pump  2009   due to reaction to morphine/changed to fentanyl  . PAIN PUMP IMPLANTATION     with fentanyl  . spinal cord stimulator implanted     for pain per Dr. Maryruth Eve  . subcut pain pump implanted    . TONSILLECTOMY    . TOTAL KNEE ARTHROPLASTY  02/29/2012   Procedure: TOTAL KNEE ARTHROPLASTY;  Surgeon: Kerin Salen, MD;  Location: [place];  Service: Orthopedics;  Laterality: Right;   FHx:    Reviewed / unchanged  SHx:    Reviewed / unchanged   Systems Review:  Constitutional: Denies fever, chills, wt changes, headaches, insomnia, fatigue, night sweats, change in appetite. Eyes: Denies redness, blurred vision, diplopia, discharge, itchy, watery eyes.  ENT: Denies discharge, congestion, post nasal drip, epistaxis, sore throat, earache, hearing loss, dental pain, tinnitus, vertigo, sinus pain, snoring.  CV: Denies chest pain, palpitations, irregular heartbeat, syncope, dyspnea, diaphoresis, orthopnea, PND, claudication or edema. Respiratory: denies cough, dyspnea, DOE, pleurisy, hoarseness, laryngitis, wheezing.  Gastrointestinal: Denies dysphagia, odynophagia, heartburn, reflux, water brash, abdominal pain or cramps, nausea, vomiting, bloating,  diarrhea, constipation, hematemesis, melena, hematochezia  or hemorrhoids. Genitourinary: Denies dysuria, frequency, urgency, nocturia, hesitancy, discharge, hematuria or flank pain. Musculoskeletal: Denies arthralgias, myalgias, stiffness, jt. swelling, pain, limping or strain/sprain.  Skin: Denies pruritus, rash, hives, warts, acne, eczema or change in skin lesion(s). Neuro: No weakness, tremor, incoordination, spasms, paresthesia or pain. Psychiatric: Denies confusion, memory loss or sensory loss. Endo: Denies change in weight, skin or hair change.  Heme/Lymph: No excessive bleeding, bruising or enlarged lymph nodes.  Physical Exam  BP (!) 80/52   Pulse (!) 52   Temp (!) 97 F (36.1 C)   Resp 16   Ht 5' 7.5" (1.715 m)   Wt 192 lb (87.1 kg)   BMI 29.63 kg/m   Postural Sitting      BP    82/52   P  90         &       Standing     BP 74/43       P 99  Appears  Over nourished, well groomed  and in no distress.  Eyes: PERRLA, EOMs, conjunctiva no swelling or erythema. Sinuses: No frontal/maxillary tenderness ENT/Mouth: EAC's clear, TM's nl w/o erythema, bulging. Nares clear w/o erythema, swelling, exudates. Oropharynx clear without erythema or exudates. Oral hygiene is good. Tongue normal, non obstructing. Hearing intact.  Neck: Supple. Thyroid not palpable. Car 2+/2+ without bruits, nodes or JVD. Chest: Respirations nl with BS clear & equal w/o rales, rhonchi, wheezing or stridor.  Cor: Heart sounds normal w/ regular rate and rhythm without sig. murmurs, gallops, clicks or rubs. Peripheral pulses normal and equal  without edema.  Abdomen: Soft & bowel sounds normal. Non-tender w/o guarding, rebound, hernias, masses or organomegaly.  Lymphatics: Unremarkable.  Musculoskeletal: Full ROM all peripheral extremities, joint stability, 5/5 strength and normal gait.  Skin: Warm, dry without exposed rashes, lesions or ecchymosis apparent.  Neuro: Cranial nerves intact, reflexes equal  bilaterally. Sensory-motor testing grossly intact. Tendon reflexes grossly intact.  Pysch: Alert & oriented x 3.  Insight and judgement nl & appropriate. No ideations.  Assessment and Plan:  1. Orthostatic hypotension  -  Recommended taper Entresto to 1/2 tablet &  Monitor standing  blood pressures at home. - Recommended 2 week f/u OV   - Continue DASH/ low sodium diet.  Reminder to go to the ER if any CP,  SOB, nausea, dizziness, severe HA, changes vision/speech.  - CBC with Differential/Platelet - COMPLETE METABOLIC PANEL WITH GFR  2. 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 Medication management  - CBC with Differential/Platelet - COMPLETE METABOLIC PANEL WITH GFR       Discussed  regular exercise, BP monitoring, weight control to achieve/maintain BMI less than 25 and discussed med and SE's. Recommended labs to assess and monitor clinical status with further disposition pending results of labs. Over 30 minutes of exam, counseling, chart review was performed.

## 2018-01-19 LAB — COMPLETE METABOLIC PANEL WITH GFR
AG Ratio: 1.7 (calc) (ref 1.0–2.5)
ALBUMIN MSPROF: 4.4 g/dL (ref 3.6–5.1)
ALKALINE PHOSPHATASE (APISO): 104 U/L (ref 40–115)
ALT: 13 U/L (ref 9–46)
AST: 12 U/L (ref 10–35)
BILIRUBIN TOTAL: 0.6 mg/dL (ref 0.2–1.2)
BUN: 12 mg/dL (ref 7–25)
CHLORIDE: 103 mmol/L (ref 98–110)
CO2: 28 mmol/L (ref 20–32)
Calcium: 10.1 mg/dL (ref 8.6–10.3)
Creat: 0.85 mg/dL (ref 0.70–1.18)
GFR, Est African American: 102 mL/min/{1.73_m2} (ref >=60)
GFR, Est Non African American: 88 mL/min/{1.73_m2} (ref >=60)
GLUCOSE: 149 mg/dL — AB (ref 65–99)
Globulin: 2.6 g/dL (calc) (ref 1.9–3.7)
Potassium: 5.6 mmol/L — ABNORMAL HIGH (ref 3.5–5.3)
SODIUM: 138 mmol/L (ref 135–146)
Total Protein: 7 g/dL (ref 6.1–8.1)

## 2018-01-19 LAB — CBC WITH DIFFERENTIAL/PLATELET
BASOS ABS: 32 {cells}/uL (ref 0–200)
Basophils Relative: 0.3 %
EOS PCT: 3 %
Eosinophils Absolute: 315 cells/uL (ref 15–500)
HEMATOCRIT: 39.2 % (ref 38.5–50.0)
HEMOGLOBIN: 13.3 g/dL (ref 13.2–17.1)
LYMPHS ABS: 2909 {cells}/uL (ref 850–3900)
MCH: 29.5 pg (ref 27.0–33.0)
MCHC: 33.9 g/dL (ref 32.0–36.0)
MCV: 86.9 fL (ref 80.0–100.0)
MPV: 11.1 fL (ref 7.5–12.5)
Monocytes Relative: 5.8 %
NEUTROS ABS: 6636 {cells}/uL (ref 1500–7800)
Neutrophils Relative %: 63.2 %
Platelets: 257 10*3/uL (ref 140–400)
RBC: 4.51 10*6/uL (ref 4.20–5.80)
RDW: 12.8 % (ref 11.0–15.0)
Total Lymphocyte: 27.7 %
WBC mixed population: 609 cells/uL (ref 200–950)
WBC: 10.5 10*3/uL (ref 3.8–10.8)

## 2018-01-21 ENCOUNTER — Encounter: Payer: Self-pay | Admitting: Internal Medicine

## 2018-01-26 ENCOUNTER — Other Ambulatory Visit: Payer: Self-pay | Admitting: Internal Medicine

## 2018-02-05 ENCOUNTER — Ambulatory Visit: Payer: Medicare Other | Admitting: Internal Medicine

## 2018-02-05 ENCOUNTER — Encounter: Payer: Self-pay | Admitting: Internal Medicine

## 2018-02-05 VITALS — BP 104/70 | HR 76 | Temp 97.5°F | Resp 16 | Ht 67.5 in | Wt 190.8 lb

## 2018-02-05 DIAGNOSIS — I951 Orthostatic hypotension: Secondary | ICD-10-CM

## 2018-02-05 DIAGNOSIS — N182 Chronic kidney disease, stage 2 (mild): Secondary | ICD-10-CM

## 2018-02-05 DIAGNOSIS — E1122 Type 2 diabetes mellitus with diabetic chronic kidney disease: Secondary | ICD-10-CM | POA: Diagnosis not present

## 2018-02-05 DIAGNOSIS — I428 Other cardiomyopathies: Secondary | ICD-10-CM | POA: Diagnosis not present

## 2018-02-05 DIAGNOSIS — Z79899 Other long term (current) drug therapy: Secondary | ICD-10-CM

## 2018-02-05 DIAGNOSIS — E1129 Type 2 diabetes mellitus with other diabetic kidney complication: Secondary | ICD-10-CM

## 2018-02-05 DIAGNOSIS — I5042 Chronic combined systolic (congestive) and diastolic (congestive) heart failure: Secondary | ICD-10-CM

## 2018-02-05 DIAGNOSIS — E1165 Type 2 diabetes mellitus with hyperglycemia: Secondary | ICD-10-CM

## 2018-02-05 NOTE — Progress Notes (Signed)
Subjective:    Patient ID: James Parsons, male    DOB: 06-Sep-1945, 72 y.o.   MRN: [id number]  HPI     This very nice 72 yo MWM with multiple co-morbidities  (HTN, ASHD/chronic CHF, HLD, T2_DM, hronic Pain Syndrome on chronic Opioids /Fentanyl pump)     Returns for f/u management of his poorly controlled DM (recent A1c 13.2%) off of some of his diabetic meds and which he states he has resumed.      Also, he has chronic combined Heart Failure & was severely hypotensive with a significant postural drop after his Entresto was increased. He was advised to taper his dose from 1 tab to 1/2 tab 2 x /day  & monitor standing BP's.     Repeat postural BP's still have a significant drop and he is advised to stop his Flomax and consult with Dr Aundra Dubin again re: cardiac meds prior to scheduled back surg for Nov 19.  Medication Sig  . aspirin 81 MG  Take daily  . atorvastatin  80 MG tablet Take 0.5 tablets  daily at 6 PM.  . VITAMIN D 5000 UNITS TABS Take 5,000 Units  2 (two) times daily.  . DULoxetine (CYMBALTA) 60 MG capsule Take 60 mg  daily.   . empagliflozin (JARDIANCE) 10 MG TABS tablet Take 10 mg  daily.  . folic acid (FOLVITE) 1 MG tablet Take 1 tabletdaily.  . Glimepiride 4 MG tablet TAKE 1 TABLET TWICE DAILY WITH MEALS.   [name] lidocaine  5 % patch Place 1 patch onto the skin daily as needed  . metFORMIN-XR 500 MG  TAKE 2 TABLETS 2 X / DAY FOR DIABETES  . pregabalin  150 MG  Take  2 (two) times daily.  [name] PRESCRIPTION MEDICATION by Intrathecal route continuous. Fentanyl 2mg /ml solution . Daily dose 0.8820 mg Compounded at [facility] [phone] Pump is refilled monthly  . ranitidine (ZANTAC) 300 MG tablet TAKE 1 TABLET BY MOUTH EVERYDAY AT BEDTIME  . sacubitril-valsartan (ENTRESTO) 49-51 MG  Taking 1/2 tablet twice a day.)  . spironolactone  25 MG tablet Take 1 tablet (25 mg total) by mouth at bedtime.  . tamsulosin  0.4 MG CAPS capsule TAKE ONE CAPSULE BY MOUTH EVERY  DAY AFTER SUPPER  . Tapentadol HCl (NUCYNTA) 100 MG TABS Take 100 mg by mouth twice daily, may also take 100 mg extra during the day as needed for pain  . celecoxib  200 MG capsule Take 200 mg by mouth daily as needed (for headache).   . cyclobenzaprine5 MG tablet Take 1 tablet (5 mg total) by mouth 3 (three) times daily as needed for muscle spasms. Muscle spasm.   Allergies  Allergen Reactions  . Codeine Itching  . Morphine And Related Other (See Comments)    hallucinations   Review of Systems    10 point systems review negative except as above.    Objective:   Physical Exam  BP 104/70 Comment: 104/70-sit and 94/62- stand  Pulse 76   Temp (!) 97.5 F (36.4 C)   Resp 16   Ht 5' 7.5" (1.715 m)   Wt 190 lb 12.8 oz (86.5 kg)   BMI 29.44 kg/m    Postural       Sitting BP 121/70    P 79          &         Standing BP     78/46    P 42  HEENT - WNL. Neck - supple.  Chest - Clear equal BS. Cor - Nl HS. RRR w/o sig MGR. PP 1(+). No edema. MS- FROM w/o deformities.  Gait Nl. Neuro -  Nl w/o focal abnormalities.    Assessment & Plan:   1. 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 Orthostatic hypotension  - BASIC METABOLIC PANEL WITH GFR  3. Type 2 diabetes mellitus with stage 2 chronic kidney disease, without long-term current use of insulin (HCC)  - BASIC METABOLIC PANEL WITH GFR  4. Non-ischemic cardiomyopathy (HCC)  - BASIC METABOLIC PANEL WITH GFR  5. Chronic combined systolic and diastolic HF (heart failure) (HCC)  - BASIC METABOLIC PANEL WITH GFR  6. Medication management  - BASIC METABOLIC PANEL WITH GFR

## 2018-02-06 LAB — BASIC METABOLIC PANEL WITH GFR
BUN: 14 mg/dL (ref 7–25)
CALCIUM: 10.1 mg/dL (ref 8.6–10.3)
CO2: 30 mmol/L (ref 20–32)
Chloride: 103 mmol/L (ref 98–110)
Creat: 0.78 mg/dL (ref 0.70–1.18)
GFR, EST AFRICAN AMERICAN: 105 mL/min/{1.73_m2} (ref >=60)
GFR, EST NON AFRICAN AMERICAN: 91 mL/min/{1.73_m2} (ref >=60)
Glucose, Bld: 229 mg/dL — ABNORMAL HIGH (ref 65–99)
Potassium: 5.4 mmol/L — ABNORMAL HIGH (ref 3.5–5.3)
Sodium: 141 mmol/L (ref 135–146)

## 2018-02-22 ENCOUNTER — Ambulatory Visit: Payer: Self-pay | Admitting: Internal Medicine

## 2018-02-26 ENCOUNTER — Ambulatory Visit: Payer: Medicare Other | Admitting: Internal Medicine

## 2018-02-26 ENCOUNTER — Encounter: Payer: Self-pay | Admitting: Internal Medicine

## 2018-02-26 ENCOUNTER — Encounter (HOSPITAL_COMMUNITY): Payer: Self-pay

## 2018-02-26 ENCOUNTER — Ambulatory Visit (HOSPITAL_COMMUNITY): Admit: 2018-02-26 | Payer: Medicare Other | Admitting: Internal Medicine

## 2018-02-26 VITALS — BP 114/64 | HR 93 | Temp 97.5°F | Ht 67.5 in | Wt 193.0 lb

## 2018-02-26 DIAGNOSIS — E1165 Type 2 diabetes mellitus with hyperglycemia: Secondary | ICD-10-CM

## 2018-02-26 DIAGNOSIS — E1122 Type 2 diabetes mellitus with diabetic chronic kidney disease: Secondary | ICD-10-CM

## 2018-02-26 DIAGNOSIS — I428 Other cardiomyopathies: Secondary | ICD-10-CM

## 2018-02-26 DIAGNOSIS — E1129 Type 2 diabetes mellitus with other diabetic kidney complication: Secondary | ICD-10-CM | POA: Diagnosis not present

## 2018-02-26 DIAGNOSIS — N182 Chronic kidney disease, stage 2 (mild): Secondary | ICD-10-CM

## 2018-02-26 DIAGNOSIS — I5042 Chronic combined systolic (congestive) and diastolic (congestive) heart failure: Secondary | ICD-10-CM

## 2018-02-26 SURGERY — ESOPHAGOGASTRODUODENOSCOPY (EGD) WITH PROPOFOL
Anesthesia: Monitor Anesthesia Care

## 2018-02-26 NOTE — Progress Notes (Signed)
Subjective:     Patient ID: James Parsons, male   DOB: [birth year], 71 y.o.   MRN: [id number]  HPI   Patient is a 72 yo MWM returning for 3 week f/u of his glucoses  & BP.  He was off some of his Diabetic meds which he has since resumed. In Apr his A1c was 13.6%.  Wife reports random glucoses are ranging betw 110-180 mg%. Patient had previously been titrated upwards on Entresto and and at last OV had a dangerous postural drop  from sitting BP 121/70 to standing BP 78/46 and he was advised to back titrate the Entresto to 1/2 tab bid. Since then home standing BP's per his wife have been > sys 100. He denies and worsening of DOE nor Orthopnea/PND or edema.  Medication Sig  . aspirin 81 MG chewable tablet Chew 81 mg by mouth at bedtime.  [name] atorvastatin  80 MG tablet Take 0.5 tablets (40 mg total) by mouth daily at 6 PM.  . VITAMIN D 5000 UNITS TABS Take 5,000 Units by mouth 2 (two) times daily.  . DULoxetine  60 MG capsule Take 60 mg by mouth daily.   [name] JARDIANCE 10 MG TABS tablet Take 10 mg by mouth daily.  . folic acid 1 MG tablet Take 1 tablet (1 mg total) by mouth daily.  [name] glimepiride 4 MG tablet TAKE 1 TABLET TWICE DAILY WITH MEALS. DO NOT TAKE IF YOU DO NOT EAT.  [name] lidocaine (LIDODERM) 5 % Place 1 patch onto the skin daily as needed (pain).  . metFORMIN-XR 500 MG  TAKE 2 TABLETS 2 X / DAY FOR DIABETES  . LYRICA 150 MG capsule Take 150 mg by mouth 2 (two) times daily.  .  by Intrathecal route continuous. Fentanyl 2mg /ml solution . Daily dose 0.8820 mg  . ranitidine  300 MG tablet TAKE 1 TABLET BY MOUTH EVERYDAY AT BEDTIME  . ENTRESTO 49-51 MG Taking 1/2 tablet twice a day.)  . spironolactone  25 MG tablet Take 1 tablet (25 mg total) by mouth at bedtime.  . NUCYNTA 100 MG TABS Take 100 mg by mouth twice daily, may also take 100 mg extra during the day as needed for pain   Allergies  Allergen Reactions  . Codeine Itching  . Morphine And Related Other (See Comments)    hallucinations    Past Medical History:  Diagnosis Date  . Allergy   . Anemia   . Arthritis    right hand  . Borderline hypertension   . CHF (congestive heart failure) ([place])    pt denies  . Clausterphobic   . Clotting disorder (HCC)    bleeds easily-no dx  . COPD (chronic obstructive pulmonary disease) ([place])    denies  . Depression    hx of   . Diabetes mellitus    type 2  . Diverticulosis of colon   . Full dentures   . GERD (gastroesophageal reflux disease)    pt denoes  . Hx of colonic polyps   . Hyperlipidemia   . Hypertension   . Lipoma   . Low back pain   . Mental disorder   . Obesity   . Pneumonia    hx  . RSD (reflex sympathetic dystrophy)   . Venous insufficiency      Review of Systems     Objective:   Physical Exam  BP 114/64   Pulse 93   Temp (!) 97.5 F (36.4 C)   Ht 5'  7.5" (1.715 m)   Wt 193 lb (87.5 kg)   SpO2 92%   BMI 29.78 kg/m   Postural Sitting         BP 123/68      P 88         &        Standing BP 96/60     P  94   HEENT - WNL. Neck - supple.  Chest - Clear equal BS. Cor - Nl HS. RRR w/o sig MGR. PP 1(+). No edema. MS- FROM w/o deformities.  Gait Nl. Neuro -  Nl w/o focal abnormalities.     Assessment:     1. 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 Type 2 diabetes mellitus with stage 2 chronic kidney disease, without long-term current use of insulin ([place])  3. Non-ischemic cardiomyopathy ([place])  4. Chronic combined systolic and diastolic HF (heart failure) ([place])  - discussed meds/SE's. Encourager better Diabetic diet

## 2018-03-21 ENCOUNTER — Ambulatory Visit: Payer: Self-pay | Admitting: Physician Assistant

## 2018-04-16 ENCOUNTER — Encounter: Payer: Self-pay | Admitting: Physician Assistant

## 2018-04-16 DIAGNOSIS — I110 Hypertensive heart disease with heart failure: Secondary | ICD-10-CM | POA: Insufficient documentation

## 2018-04-18 ENCOUNTER — Other Ambulatory Visit (HOSPITAL_COMMUNITY): Payer: Self-pay | Admitting: Cardiology

## 2018-05-02 ENCOUNTER — Other Ambulatory Visit (HOSPITAL_COMMUNITY): Payer: Self-pay | Admitting: Cardiology

## 2018-05-30 ENCOUNTER — Encounter: Payer: Self-pay | Admitting: Internal Medicine

## 2018-06-04 ENCOUNTER — Encounter: Payer: Self-pay | Admitting: Physician Assistant

## 2018-06-04 ENCOUNTER — Ambulatory Visit: Payer: Medicare Other | Admitting: Physician Assistant

## 2018-06-04 VITALS — BP 110/72 | HR 98 | Temp 97.7°F | Ht 67.5 in | Wt 192.0 lb

## 2018-06-04 DIAGNOSIS — I5022 Chronic systolic (congestive) heart failure: Secondary | ICD-10-CM

## 2018-06-04 DIAGNOSIS — Z0001 Encounter for general adult medical examination with abnormal findings: Secondary | ICD-10-CM

## 2018-06-04 DIAGNOSIS — G894 Chronic pain syndrome: Secondary | ICD-10-CM

## 2018-06-04 DIAGNOSIS — G9050 Complex regional pain syndrome I, unspecified: Secondary | ICD-10-CM

## 2018-06-04 DIAGNOSIS — R6889 Other general symptoms and signs: Secondary | ICD-10-CM | POA: Diagnosis not present

## 2018-06-04 DIAGNOSIS — D649 Anemia, unspecified: Secondary | ICD-10-CM

## 2018-06-04 DIAGNOSIS — E782 Mixed hyperlipidemia: Secondary | ICD-10-CM

## 2018-06-04 DIAGNOSIS — E66811 Obesity, class 1: Secondary | ICD-10-CM

## 2018-06-04 DIAGNOSIS — R1310 Dysphagia, unspecified: Secondary | ICD-10-CM

## 2018-06-04 DIAGNOSIS — I428 Other cardiomyopathies: Secondary | ICD-10-CM

## 2018-06-04 DIAGNOSIS — E1121 Type 2 diabetes mellitus with diabetic nephropathy: Secondary | ICD-10-CM

## 2018-06-04 DIAGNOSIS — Z79899 Other long term (current) drug therapy: Secondary | ICD-10-CM

## 2018-06-04 DIAGNOSIS — I700 Atherosclerosis of aorta: Secondary | ICD-10-CM | POA: Diagnosis not present

## 2018-06-04 DIAGNOSIS — I110 Hypertensive heart disease with heart failure: Secondary | ICD-10-CM | POA: Diagnosis not present

## 2018-06-04 DIAGNOSIS — K219 Gastro-esophageal reflux disease without esophagitis: Secondary | ICD-10-CM

## 2018-06-04 DIAGNOSIS — I872 Venous insufficiency (chronic) (peripheral): Secondary | ICD-10-CM

## 2018-06-04 DIAGNOSIS — I447 Left bundle-branch block, unspecified: Secondary | ICD-10-CM

## 2018-06-04 DIAGNOSIS — Z0000 Encounter for general adult medical examination without abnormal findings: Secondary | ICD-10-CM

## 2018-06-04 DIAGNOSIS — Z6830 Body mass index (BMI) 30.0-30.9, adult: Secondary | ICD-10-CM

## 2018-06-04 DIAGNOSIS — J449 Chronic obstructive pulmonary disease, unspecified: Secondary | ICD-10-CM

## 2018-06-04 DIAGNOSIS — I10 Essential (primary) hypertension: Secondary | ICD-10-CM

## 2018-06-04 DIAGNOSIS — D126 Benign neoplasm of colon, unspecified: Secondary | ICD-10-CM

## 2018-06-04 DIAGNOSIS — E6609 Other obesity due to excess calories: Secondary | ICD-10-CM

## 2018-06-04 DIAGNOSIS — M1990 Unspecified osteoarthritis, unspecified site: Secondary | ICD-10-CM

## 2018-06-04 DIAGNOSIS — I2510 Atherosclerotic heart disease of native coronary artery without angina pectoris: Secondary | ICD-10-CM

## 2018-06-04 DIAGNOSIS — E559 Vitamin D deficiency, unspecified: Secondary | ICD-10-CM

## 2018-06-04 DIAGNOSIS — K222 Esophageal obstruction: Secondary | ICD-10-CM

## 2018-06-04 DIAGNOSIS — M47812 Spondylosis without myelopathy or radiculopathy, cervical region: Secondary | ICD-10-CM

## 2018-06-04 NOTE — Progress Notes (Signed)
MEDICARE ANNUAL WELLNESS VISIT AND FOLLOW UP Assessment:    Encounter for Medicare annual wellness exam 1 year  Venous (peripheral) insufficiency Reminded to wear compression hose, elevate extremities, weight daily  Chronic systolic congestive heart failure (HCC) Weights daily, limit salt Followed by cardiology  Non-ischemic cardiomyopathy ([place]) Followed by cardiology  LBBB (left bundle branch block) Followed by cardiology  Essential hypertension Continue medication Monitor blood pressure at home; call if consistently over 130/80 Continue DASH diet.   Reminder to go to the ER if any CP, SOB, nausea, dizziness, severe HA, changes vision/speech, left arm numbness and tingling and jaw pain.  CAD in native artery Control blood pressure, cholesterol, glucose, increase exercise.  Followed by cardiology  Atherosclerosis of aorta ([place]) Control blood pressure, cholesterol, glucose, increase exercise.  Followed by cardiology  Chronic obstructive pulmonary disease, unspecified COPD type ([place]) Avoid triggers, continue inhaler as needed  Gastroesophageal reflux disease, esophagitis presence not specified Avoid triggers, continue PPI, followed by GI  Esophageal stricture Followed by GI, continue PPI  Dysphagia, unspecified type Reminded to take small bites, avoid eating while distracted Followed by GI   Benign neoplasm of colon, unspecified part of colon Up to date on colonoscopies, followed by GI  Controlled type 2 diabetes mellitus with diabetic nephropathy, without long-term current use of insulin [facility]) Education: Reviewed 'ABCs' of diabetes management (respective goals in parentheses):  A1C (<7), blood pressure (<130/80), and cholesterol (LDL <70) Reminded Eye Exam yearly and Dental Exam every 6 months. Dietary/physical activity recommendations reviewed LONG DISCUSSION ABOUT THIS AND RISK OF STROKE NEED TO CHECK SUGARS MORE OFTEN  RSD (reflex sympathetic  dystrophy) Followed by ortho/pain management  Osteoarthritis, unspecified osteoarthritis type, unspecified site Followed by ortho/pain management  Vitamin D deficiency Near goal at recent check; continue to recommend supplementation for goal of 70-100 Defer vitamin D level  Class 1 obesity due to excess calories with serious comorbidity and body mass index (BMI) of 30.0 to 30.9 in adult Long discussion about weight loss, diet, and exercise Recommended diet heavy in fruits and veggies and low in animal meats, cheeses, and dairy products, appropriate calorie intake Discussed appropriate weight for height  Follow up at next visit  Mixed hyperlipidemia Continue medications: atorvastatin Continue low cholesterol diet and exercise.  Check lipid panel.   Medication management CBC, CMP/GFR, magnesium  Chronic bilateral low back pain without sciatica Followed by ortho/pain management  Diverticulosis of large intestine without hemorrhage Up to date on colonoscopies, increase fiber  Chronic pain associated with significant psychosocial dysfunction Followed by ortho/pain management  Anemia, unspecified type CBC    Over 30 minutes of exam, counseling, chart review, and critical decision making was performed  Future Appointments  Date Time Provider [place]  09/04/2018  3:00 PM Unk Pinto, MD [facility] None  10/03/2018  2:00 PM Liane Comber, NP [facility] None     Plan:   During the course of the visit the patient was educated and counseled about appropriate screening and preventive services including:    Pneumococcal vaccine   Influenza vaccine  Prevnar 13  Td vaccine  Screening electrocardiogram  Colorectal cancer screening  Diabetes screening  Glaucoma screening  Nutrition counseling    Subjective:  James Parsons is a 73 y.o. male who presents for Medicare Annual Wellness Visit and 3 month follow up for HTN, hyperlipidemia, diabetes, and  vitamin D Def.   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 Last surgery was a lumbar fusion in 01/2015 by Dr Rolena Infante.  He did have a spinal cord stimulator implanted in 2005, but no longer uses it, and follows with [facility].   In Jan 2018,  he was hospitalized with Acute/chronic CHF and Heart cath  showed non-obstructive CAD. Patient is followed in the [facility] with most recent ECHO 03/2017 showing LV EF 35% with grade 1 diastolic dysfunction.    BMI is Body mass index is 29.63 kg/m.  He has a history of Combined Systolic and Diastolic, denies dyspnea on exertion, orthopnea, paroxysmal nocturnal dyspnea and edema. Positive for none. Wt Readings from Last 3 Encounters:  06/04/18 192 lb (87.1 kg)  02/26/18 193 lb (87.5 kg)  02/05/18 190 lb 12.8 oz (86.5 kg)    His blood pressure has been controlled at home, today their BP is BP: 110/72 He does not workout. He denies chest pain, shortness of breath, dizziness.   He is on cholesterol medication (atorvastatin 40 mg daily) and denies myalgias. His cholesterol is not at goal of LDL <70. The cholesterol last visit was:   Lab Results  Component Value Date   CHOL 132 12/13/2017   HDL 37 (L) 12/13/2017   LDLCALC 69 12/13/2017   LDLDIRECT 178.6 09/08/2009   TRIG 191 (H) 12/13/2017   CHOLHDL 3.6 12/13/2017   He has been working on diet for T2DM on metformin 2000 total a day, jardiance 10mg  and glimepiride 4 mg BID, , and denies foot ulcerations, increased appetite, nausea, paresthesia of the feet, polydipsia, polyuria, visual disturbances, vomiting and weight loss. Checking blood sugars daily but wife does and he does not know how they are. Last A1C in the office was:  Lab Results  Component Value Date   HGBA1C 13.2 (H) 12/13/2017   Last GFR Lab Results  Component Value Date   GFRNONAA 91 02/05/2018    Patient is on Vitamin D  supplement.   Lab Results  Component Value Date   VD25OH 59 12/13/2017      Medication Review:  Current Outpatient Medications (Endocrine & Metabolic):  .  empagliflozin (JARDIANCE) 10 MG TABS tablet, Take 10 mg by mouth daily. [name]  glimepiride (AMARYL) 4 MG tablet, TAKE 1 TABLET TWICE DAILY WITH MEALS. DO NOT TAKE IF YOU DO NOT EAT. .  metFORMIN (GLUCOPHAGE-XR) 500 MG 24 hr tablet, TAKE 2 TABLETS 2 X / DAY FOR DIABETES  Current Outpatient Medications (Cardiovascular):  .  atorvastatin (LIPITOR) 80 MG tablet, Take 0.5 tablets (40 mg total) by mouth daily at 6 PM. .  ENTRESTO 49-51 MG, TAKE 1 TABLET BY MOUTH TWICE A DAY .  spironolactone (ALDACTONE) 25 MG tablet, TAKE 1 TABLET BY MOUTH EVERYDAY AT BEDTIME   Current Outpatient Medications (Analgesics):  .  aspirin 81 MG chewable tablet, Chew 81 mg by mouth at bedtime. .  Tapentadol HCl (NUCYNTA) 100 MG TABS, Take 100 mg by mouth See admin instructions. Take 100 mg by mouth twice daily, may also take 100 mg extra during the day as needed for pain  Current Outpatient Medications (Hematological):  .  folic acid (FOLVITE) 1 MG tablet, Take 1 tablet (1 mg total) by mouth daily.  Current Outpatient Medications (Other):  [name]  Cholecalciferol (VITAMIN D-3) 5000 UNITS TABS, Take 5,000 Units by mouth 2 (two) times daily. .  DULoxetine (CYMBALTA) 60 MG capsule, Take 60 mg by mouth daily.  [name]  lidocaine (LIDODERM) 5 %, Place 1 patch  onto the skin daily as needed (pain). .  pregabalin (LYRICA) 150 MG capsule, Take 150 mg by mouth 2 (two) times daily. [name]  PRESCRIPTION MEDICATION, by Intrathecal route continuous. Fentanyl 2mg /ml solution . Daily dose 0.8820 mg Compounded at [facility] [phone] Pump is refilled monthly .  ranitidine (ZANTAC) 300 MG tablet, TAKE 1 TABLET BY MOUTH EVERYDAY AT BEDTIME  Allergies: Allergies  Allergen Reactions  . Codeine Itching  . Morphine And Related Other (See Comments)    hallucinations     Current Problems (verified) has COLONIC POLYPS; Mixed hyperlipidemia; Essential hypertension; Venous (peripheral) insufficiency; COPD (chronic obstructive pulmonary disease) ([place]); Diverticulosis of large intestine; LOW BACK PAIN SYNDROME; DJD (degenerative joint disease); T2_NIDDM; Anemia; Vitamin D deficiency; Medication management; Failed back syndrome of lumbar spine; Chronic pain associated with significant psychosocial dysfunction; Spinal stenosis, multilevel; Spinal cord stimulator status; RSD (reflex sympathetic dystrophy); Obesity; Encounter for Medicare annual wellness exam; Cervical arthritis; Non-ischemic cardiomyopathy ([place]); CAD in native artery; LBBB (left bundle branch block); Atherosclerosis of aorta ([place]); Systolic CHF ([place]); Dysphagia; Esophageal stricture; Gastroesophageal reflux disease; and Hypertensive heart disease with heart failure (HCC) on their problem list.  Screening Tests Immunization History  Administered Date(s) Administered  . Influenza Split 04/22/2011, 01/20/2012  . Influenza Whole 01/15/2007, 01/21/2009, 03/08/2010  . Influenza, High Dose Seasonal PF 02/07/2013, 12/10/2015  . Influenza-Unspecified 12/10/2013, 01/10/2015, 12/08/2016  . Pneumococcal Conjugate-13 06/09/2015  . Pneumococcal Polysaccharide-23 04/22/2011  . Tdap 04/11/2001, 05/07/2013   Preventative care: Last colonoscopy: 01/24/2013 - due 2024 EGD: 05/2017 Cath 04/2016 Echo 03/2017  Prior vaccinations: TD or Tdap: 2015  Influenza: 2019  Pneumococcal: 2013 Prevnar13: 2017 Shingles/Zostavax: declines  Names of Other Physician/Practitioners you currently use: 1. [facility] here for primary care 2. Dr. Katy Fitch, eye doctor, last visit 09/2017 no retinopathy 3. Has dentures , dentist   Patient Care Team: Unk Pinto, MD as PCP - General (Internal Medicine) Sable Feil, MD as Consulting Physician (Gastroenterology) Clent Jacks, MD as  Consulting Physician (Ophthalmology) Melina Schools, MD as Consulting Physician (Orthopedic Surgery) Lucia Bitter., MD as Physician Assistant (Pain Medicine) Lennie Odor, MD as Referring Physician (Pain Medicine) [name], MD as Consulting Physician (Cardiology)  Surgical: He  has a past surgical history that includes Back surgery ([id number],7902); morphine pump (2009); excision of lipoma from right olecranon area (11/2000); microdiscectomy and decompression (05/2002); C3-4 anterior cervical discectomy and fusion with plating at c3-4 (05/2006); spinal cord stimulator implanted; subcut pain pump implanted; Pain pump implantation; Tonsillectomy; Colonoscopy w/ polypectomy; Knee arthroscopy; Total knee arthroplasty (02/29/2012); Lumbar laminectomy/decompression microdiscectomy (N/A, 01/08/2014); Knee arthroscopy (Right, 06/06/2014); Colonoscopy; Cardiac catheterization (N/A, 04/25/2016); Esophagogastroduodenoscopy (egd) with propofol (N/A, 06/06/2017); and Balloon dilation (N/A, 06/06/2017). Family His family history includes Cancer in his father. Social history  He reports that he quit smoking about 16 years ago. His smoking use included cigarettes. He has a 120.00 pack-year smoking history. He has never used smokeless tobacco. He reports that he does not drink alcohol or use drugs.  MEDICARE WELLNESS OBJECTIVES: Physical activity: Current Exercise Habits: The patient does not participate in regular exercise at present(will try to walk daily for short perior), Exercise limited by: orthopedic condition(s) Cardiac risk factors: Cardiac Risk Factors include: advanced age (>52men, >79 women);diabetes mellitus;dyslipidemia;hypertension;male gender;sedentary lifestyle Depression/mood screen:   Depression screen [facility] 2/9 06/04/2018  Decreased Interest 0  Down, Depressed, Hopeless 0  PHQ - 2 Score 0    ADLs:  In your present state of  health, do you have any difficulty performing the following  activities: 06/04/2018 02/26/2018  Hearing? N N  Vision? N N  Difficulty concentrating or making decisions? N N  Walking or climbing stairs? Y N  Comment walks with a cane -  Dressing or bathing? N N  Doing errands, shopping? Y N  Comment wife drives but states he can still drive Facilities manager and eating ? N -  Using the Toilet? N -  In the past six months, have you accidently leaked urine? N -  Do you have problems with loss of bowel control? N -  Managing your Medications? Y -  Comment wife does medications and checks his sugar -  Managing your Finances? N -  Housekeeping or managing your Housekeeping? N -  Some recent data might be hidden    Cognitive Testing  Alert? Yes  Normal Appearance?Yes  Oriented to person? Yes  Place? Yes   Time? Yes  Recall of three objects?  Yes  Can perform simple calculations? Yes  Displays appropriate judgment?Yes  Can read the correct time from a watch face?Yes  EOL planning: Does Patient Have a Medical Advance Directive?: Yes Does patient want to make changes to medical advance directive?: No - Patient declined Yes  Objective:   Today's Vitals   06/04/18 1423  BP: 110/72  Pulse: 98  Temp: 97.7 F (36.5 C)  SpO2: 92%  Weight: 192 lb (87.1 kg)  Height: 5' 7.5" (1.715 m)   Body mass index is 29.63 kg/m.  General appearance: alert, no distress, WD/WN, male HEENT: normocephalic, sclerae anicteric, TMs pearly, nares patent, no discharge or erythema, pharynx normal Oral cavity: MMM, no lesions Neck: supple, no lymphadenopathy, no thyromegaly, no masses Heart: RRR, normal S1, S2, distant heart sounds Lungs: CTA bilaterally, no wheezes, rhonchi, or rales Abdomen: +bs, soft, non tender, non distended, no masses, no hepatomegaly, no splenomegaly Musculoskeletal: nontender, no swelling, no obvious deformity Extremities: no edema, no cyanosis, no clubbing Pulses: 2+ symmetric, upper and lower extremities, normal cap  refill Neurological: alert, oriented x 3, CN2-12 intact, strength normal upper extremities and lower extremities, sensation normal throughout,  Gait slow with cane Psychiatric: normal affect, behavior normal, pleasant   Medicare Attestation I have personally reviewed: The patient's medical and social history Their use of alcohol, tobacco or illicit drugs Their current medications and supplements The patient's functional ability including ADLs,fall risks, home safety risks, cognitive, and hearing and visual impairment Diet and physical activities Evidence for depression or mood disorders  The patient's weight, height, BMI, and visual acuity have been recorded in the chart.  I have made referrals, counseling, and provided education to the patient based on review of the above and I have provided the patient with a written personalized care plan for preventive services.     Vicie Mutters, PA-C   06/04/2018

## 2018-06-04 NOTE — Patient Instructions (Addendum)
Your A1C last time was 13.4- see below Goal is below 8 If still this high will adjust meds May need to switch you to insulin  What does your A1C results mean?  Your A1C is a measure of your sugar over the past 3 months   Use this chart as a guide to compare the results of your A1C blood test to your estimated average daily blood sugar:  A1C Range Average Sugar  4.0-6.0% 60-120 mg/dl  6.1-7.0% 121 - 150 mg/dl  7.1-8.0% 151-180 mg/dl  8.1-9.0% 181-210 mg/dl  10.1-11% 211-240 mg/dl  11.1-12.0% 271-300 mg/dl  12.1-13.0% 301-330 mg/dl  13.1-14.0% 331-360 mg/dl  Greater than 14.0% Greater than 360 mg/dl      When it comes to diets, agreement about the perfect plan isn't easy to find, even among the experts. Experts at the [employer] developed an idea known as the Healthy Eating Plate. Just imagine a plate divided into logical, healthy portions.  The emphasis is on diet quality:  Load up on vegetables and fruits - one-half of your plate: Aim for color and variety, and remember that potatoes don't count.  Go for whole grains - one-quarter of your plate: Whole wheat, barley, wheat berries, quinoa, oats, brown rice, and foods made with them. If you want pasta, go with whole wheat pasta.  Protein power - one-quarter of your plate: Fish, chicken, beans, and nuts are all healthy, versatile protein sources. Limit red meat.  The diet, however, does go beyond the plate, offering a few other suggestions.  Use healthy plant oils, such as olive, canola, soy, corn, sunflower and peanut. Check the labels, and avoid partially hydrogenated oil, which have unhealthy trans fats.  If you're thirsty, drink water. Coffee and tea are good in moderation, but skip sugary drinks and limit milk and dairy products to one or two daily servings.  The type of carbohydrate in the diet is more important than the amount. Some sources of carbohydrates, such as vegetables, fruits, whole grains,  and beans-are healthier than others.  Finally, stay active.

## 2018-06-05 LAB — CBC WITH DIFFERENTIAL/PLATELET
Absolute Monocytes: 372 cells/uL (ref 200–950)
Basophils Absolute: 30 cells/uL (ref 0–200)
Basophils Relative: 0.4 %
Eosinophils Absolute: 213 cells/uL (ref 15–500)
Eosinophils Relative: 2.8 %
HCT: 42.8 % (ref 38.5–50.0)
Hemoglobin: 14.4 g/dL (ref 13.2–17.1)
LYMPHS ABS: 1733 {cells}/uL (ref 850–3900)
MCH: 28.4 pg (ref 27.0–33.0)
MCHC: 33.6 g/dL (ref 32.0–36.0)
MCV: 84.4 fL (ref 80.0–100.0)
MPV: 11 fL (ref 7.5–12.5)
Monocytes Relative: 4.9 %
Neutro Abs: 5252 cells/uL (ref 1500–7800)
Neutrophils Relative %: 69.1 %
Platelets: 278 10*3/uL (ref 140–400)
RBC: 5.07 10*6/uL (ref 4.20–5.80)
RDW: 13.3 % (ref 11.0–15.0)
Total Lymphocyte: 22.8 %
WBC: 7.6 10*3/uL (ref 3.8–10.8)

## 2018-06-05 LAB — LIPID PANEL
Cholesterol: 210 mg/dL — ABNORMAL HIGH (ref <200)
HDL: 40 mg/dL (ref >=40)
LDL Cholesterol (Calc): 138 mg/dL (calc) — ABNORMAL HIGH
Non-HDL Cholesterol (Calc): 170 mg/dL (calc) — ABNORMAL HIGH (ref <130)
Total CHOL/HDL Ratio: 5.3 (calc) — ABNORMAL HIGH (ref <5.0)
Triglycerides: 185 mg/dL — ABNORMAL HIGH (ref <150)

## 2018-06-05 LAB — COMPLETE METABOLIC PANEL WITH GFR
AG RATIO: 1.5 (calc) (ref 1.0–2.5)
ALT: 14 U/L (ref 9–46)
AST: 15 U/L (ref 10–35)
Albumin: 4.3 g/dL (ref 3.6–5.1)
Alkaline phosphatase (APISO): 95 U/L (ref 35–144)
BUN: 15 mg/dL (ref 7–25)
CALCIUM: 9.9 mg/dL (ref 8.6–10.3)
CHLORIDE: 102 mmol/L (ref 98–110)
CO2: 26 mmol/L (ref 20–32)
Creat: 0.83 mg/dL (ref 0.70–1.18)
GFR, Est African American: 102 mL/min/{1.73_m2} (ref >=60)
GFR, Est Non African American: 88 mL/min/{1.73_m2} (ref >=60)
Globulin: 2.8 g/dL (calc) (ref 1.9–3.7)
Glucose, Bld: 243 mg/dL — ABNORMAL HIGH (ref 65–99)
POTASSIUM: 5.1 mmol/L (ref 3.5–5.3)
Sodium: 140 mmol/L (ref 135–146)
Total Bilirubin: 0.7 mg/dL (ref 0.2–1.2)
Total Protein: 7.1 g/dL (ref 6.1–8.1)

## 2018-06-05 LAB — TSH: TSH: 1.37 mIU/L (ref 0.40–4.50)

## 2018-06-05 LAB — HEMOGLOBIN A1C
Hgb A1c MFr Bld: 7.8 % of total Hgb — ABNORMAL HIGH (ref <5.7)
Mean Plasma Glucose: 177 (calc)
eAG (mmol/L): 9.8 (calc)

## 2018-06-15 ENCOUNTER — Other Ambulatory Visit: Payer: Self-pay | Admitting: Internal Medicine

## 2018-07-09 ENCOUNTER — Other Ambulatory Visit: Payer: Self-pay

## 2018-07-09 NOTE — Patient Outreach (Signed)
[facility]) Care Management  07/09/2018  James Parsons 1945/05/20 [id number]   Medication Adherence call to James Parsons HIPPA Compliant Voice message left with a call back number. James Parsons is showing past due on Jardiance 10 mg under [place].   [facility] Management Direct Dial [phone]  Fax [phone] [email or web address]

## 2018-07-17 ENCOUNTER — Encounter: Payer: Self-pay | Admitting: Internal Medicine

## 2018-08-07 ENCOUNTER — Other Ambulatory Visit (HOSPITAL_COMMUNITY): Payer: Self-pay | Admitting: Cardiology

## 2018-08-29 ENCOUNTER — Ambulatory Visit: Payer: Self-pay | Admitting: Adult Health

## 2018-09-04 ENCOUNTER — Encounter: Payer: Self-pay | Admitting: Internal Medicine

## 2018-09-05 ENCOUNTER — Telehealth: Payer: Self-pay | Admitting: *Deleted

## 2018-09-05 NOTE — Telephone Encounter (Signed)
A message was left, re: follow up visit. 

## 2018-09-17 ENCOUNTER — Encounter: Payer: Self-pay | Admitting: Internal Medicine

## 2018-09-17 NOTE — Progress Notes (Signed)
[facility]  Unk Pinto, M.D.        Uvaldo Bristle. Silverio Lay, P.A.-C         Liane Comber, Sikes                [address] [place] [address] Telephone [phone] Telefax [phone] Annual  Screening/Preventative Visit  & Comprehensive Evaluation & Examination     This very nice 73 y.o. MWM presents for a Screening /Preventative Visit & comprehensive evaluation and management of multiple medical co-morbidities.  Patient has been followed for HTN, HLD, T2_NIDDM  and Vitamin D Deficiency.     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 He last had a lumbar fusion in October 2016 by Dr Rolena Infante.       He also  has hx/oRSD/CRPS of the RLE following his back surgery in 2010. Patient is followed at the [facility]. In 2005, he had a spinal cord stimulator implanted, but it is no longer functional.. His last back surgery was in Oct 2016 for a Lumbar fusion.     HTN predates circa 2010. Patient's BP has been controlled and today's BP is at goal -  128/80.  He was hospitalized in Jan 2018 with Acute on Chronic Heart failure and Heart cath showed non-obstructive CAD. He is followed in the Heart Failure Clinic. Patient denies any cardiac symptoms as chest pain, palpitations, shortness of breath, dizziness or ankle swelling.     Patient's hyperlipidemia has been controlled with diet and medications til last labs when he was off of his Atorvastatin. Last lipids were  Lab Results  Component Value Date   CHOL 210 (H) 06/04/2018   HDL 40 06/04/2018   LDLCALC 138 (H) 06/04/2018   LDLDIRECT 178.6 09/08/2009   TRIG 185 (H) 06/04/2018   CHOLHDL 5.3 (H) 06/04/2018      Patient is overweight (BMI 29+) and has hx/o T2_NIDDM circa 2007 and on Metformin,  Jardiance & Glimeride and patient denies reactive hypoglycemic symptoms, visual blurring, diabetic polys or paresthesias. Last A1c was not at goal: Lab Results  Component Value Date   HGBA1C 7.8 (H) 06/04/2018       Finally, patient has history of Vitamin D Deficiency ("41"on Tx) / 2016)  and last Vitamin D was at goal: Lab Results  Component Value Date   VD25OH 59 12/13/2017   Current Outpatient Medications on File Prior to Visit  Medication Sig  . aspirin 81 MG chewable tablet Chew 81 mg by mouth at bedtime.  [name] atorvastatin (LIPITOR) 80 MG tablet Take 0.5 tablets (40 mg total) by mouth daily at 6 PM.  . Cholecalciferol (VITAMIN D-3) 5000 UNITS TABS Take 5,000 Units by mouth 2 (two) times daily.  . DULoxetine (CYMBALTA) 60 MG capsule Take 60 mg by mouth daily.   [name] ENTRESTO 49-51 MG TAKE 1 TABLET BY MOUTH TWICE A DAY  . JARDIANCE 10 MG TABS tablet TAKE 1 TABLET BY MOUTH EVERY DAY  . lidocaine (LIDODERM) 5 % Place 1 patch onto the skin daily as needed (pain).  . metFORMIN (GLUCOPHAGE-XR) 500 MG 24 hr tablet TAKE 2 TABLETS 2 X / DAY FOR DIABETES  . pregabalin (LYRICA) 150 MG capsule Take 150  mg by mouth 2 (two) times daily.  [name] PRESCRIPTION MEDICATION by Intrathecal route continuous. Fentanyl 2mg /ml solution . Daily dose 0.8820 mg Compounded at [facility] [phone] Pump is refilled monthly  . ranitidine (ZANTAC) 300 MG tablet TAKE 1 TABLET BY MOUTH EVERYDAY AT BEDTIME  . spironolactone (ALDACTONE) 25 MG tablet TAKE 1 TABLET BY MOUTH EVERYDAY AT BEDTIME  . Tapentadol HCl (NUCYNTA) 100 MG TABS Take 100 mg by mouth See admin instructions. Take 100 mg by mouth twice daily, may also take 100 mg extra during the day as needed for pain  . folic acid (FOLVITE) 1 MG tablet Take 1 tablet (1 mg total) by mouth daily.  [name] glimepiride (AMARYL) 4 MG tablet TAKE 1 TABLET TWICE DAILY WITH MEALS. DO NOT TAKE IF YOU DO NOT EAT. (Patient not taking: Reported on 09/18/2018)   No  current facility-administered medications on file prior to visit.    Allergies  Allergen Reactions  . Codeine Itching  . Morphine And Related Other (See Comments)    hallucinations   Past Medical History:  Diagnosis Date  . Allergy   . Anemia   . Arthritis    right hand  . Borderline hypertension   . CHF (congestive heart failure) ([place])    pt denies  . Clausterphobic   . Clotting disorder (HCC)    bleeds easily-no dx  . COPD (chronic obstructive pulmonary disease) ([place])    denies  . Depression    hx of   . Diabetes mellitus    type 2  . Diverticulosis of colon   . Full dentures   . GERD (gastroesophageal reflux disease)    pt denoes  . Hx of colonic polyps   . Hyperlipidemia   . Hypertension   . Lipoma   . Low back pain   . Mental disorder   . Obesity   . Pneumonia    hx  . RSD (reflex sympathetic dystrophy)   . Venous insufficiency    Health Maintenance  Topic Date Due  . Hepatitis C Screening  May 01, 1945  . URINE MICROALBUMIN  05/11/2018  . OPHTHALMOLOGY EXAM  09/13/2018  . INFLUENZA VACCINE  11/10/2018  . HEMOGLOBIN A1C  12/03/2018  . FOOT EXAM  09/17/2019  . COLONOSCOPY  01/25/2023  . TETANUS/TDAP  05/08/2023  . PNA vac Low Risk Adult  Completed   Immunization History  Administered Date(s) Administered  . Influenza Split 04/22/2011, 01/20/2012  . Influenza Whole 01/15/2007, 01/21/2009, 03/08/2010  . Influenza, High Dose Seasonal PF 02/07/2013, 12/10/2015  . Influenza-Unspecified 12/10/2013, 01/10/2015, 12/08/2016  . Pneumococcal Conjugate-13 06/09/2015  . Pneumococcal Polysaccharide-23 04/22/2011  . Tdap 04/11/2001, 05/07/2013   Last Colon - 01/24/2013 - Dr Henrene Pastor recc 10 yr f/u 01/2023  Past Surgical History:  Procedure Laterality Date  . BACK SURGERY  2005,2007  . BALLOON DILATION N/A 06/06/2017   Procedure: BALLOON DILATION;  Surgeon: Irene Shipper, MD;  Location: [facility];  Service: Endoscopy;  Laterality: N/A;  . C3-4 anterior cervical  discectomy and fusion with plating at c3-4  05/2006   Dr. Arnoldo Morale  . CARDIAC CATHETERIZATION N/A 04/25/2016   Procedure: Right/Left Heart Cath and Coronary Angiography;  Surgeon: Peter M Martinique, MD;  Location: [facility];  Service: Cardiovascular;  Laterality: N/A;  . COLONOSCOPY    . COLONOSCOPY W/ POLYPECTOMY    . ESOPHAGOGASTRODUODENOSCOPY (EGD) WITH PROPOFOL N/A 06/06/2017   Procedure: ESOPHAGOGASTRODUODENOSCOPY (EGD) WITH PROPOFOL;  Surgeon: Irene Shipper, MD;  Location: [facility];  Service: Endoscopy;  Laterality: N/A;  . excision of lipoma from right olecranon area  11/2000   Dr. Rise [name]  . KNEE ARTHROSCOPY     right knee  . KNEE ARTHROSCOPY Right 06/06/2014   Procedure: ARTHROSCOPY RIGHT KNEE WITH REMOVAL OF FIBROUS BANDS;  Surgeon: Kerin Salen, MD;  Location: [facility];  Service: Orthopedics;  Laterality: Right;  . LUMBAR LAMINECTOMY/DECOMPRESSION MICRODISCECTOMY N/A 01/08/2014   Procedure: L4-S1 Decompression with removal and reimplantation of spinal cord stimulator battery ;  Surgeon: Melina Schools, MD;  Location: [facility];  Service: Orthopedics;  Laterality: N/A;  . microdiscectomy and decompression  05/2002   Dr. Tonita Cong  . morphine pump  2009   due to reaction to morphine/changed to fentanyl  . PAIN PUMP IMPLANTATION     with fentanyl  . spinal cord stimulator implanted     for pain per Dr. Maryruth Eve  . subcut pain pump implanted    . TONSILLECTOMY    . TOTAL KNEE ARTHROPLASTY  02/29/2012   Procedure: TOTAL KNEE ARTHROPLASTY;  Surgeon: Kerin Salen, MD;  Location: [facility];  Service: Orthopedics;  Laterality: Right;   Family History  Problem Relation Age of Onset  . Cancer Father   . Colon cancer Neg Hx   . Esophageal cancer Neg Hx   . Stomach cancer Neg Hx   . Rectal cancer Neg Hx    Social History   Socioeconomic History  . Marital status: Married    Spouse name: [name]  . Number of children: 2  Occupational History  . Occupation: retired  Management consultant  Tobacco Use  . Smoking status: Former Smoker    Packs/day: 3.00    Years: 40.00    Pack years: 120.00    Types: Cigarettes    Last attempt to quit: 05/25/2002    Years since quitting: 16.3  . Smokeless tobacco: Never Used  Substance and Sexual Activity  . Alcohol use: No  . Drug use: No  . Sexual activity: Never    ROS Constitutional: Denies fever, chills, weight loss/gain, headaches, insomnia,  night sweats or change in appetite. Does c/o fatigue. Eyes: Denies redness, blurred vision, diplopia, discharge, itchy or watery eyes.  ENT: Denies discharge, congestion, post nasal drip, epistaxis, sore throat, earache, hearing loss, dental pain, Tinnitus, Vertigo, Sinus pain or snoring.  Cardio: Denies chest pain, palpitations, irregular heartbeat, syncope, dyspnea, diaphoresis, orthopnea, PND, claudication or edema Respiratory: denies cough, dyspnea, DOE, pleurisy, hoarseness, laryngitis or wheezing.  Gastrointestinal: Denies dysphagia, heartburn, reflux, water brash, pain, cramps, nausea, vomiting, bloating, diarrhea, constipation, hematemesis, melena, hematochezia, jaundice or hemorrhoids Genitourinary: Denies dysuria, frequency, urgency, nocturia, hesitancy, discharge, hematuria or flank pain Musculoskeletal: Denies arthralgia, myalgia, stiffness, Jt. Swelling, pain, limp or strain/sprain. Denies Falls. Skin: Denies puritis, rash, hives, warts, acne, eczema or change in skin lesion Neuro: No weakness, tremor, incoordination, spasms, paresthesia or pain Psychiatric: Denies confusion, memory loss or sensory loss. Denies Depression. Endocrine: Denies change in weight, skin, hair change, nocturia, and paresthesia, diabetic polys, visual blurring or hyper / hypo glycemic episodes.  Heme/Lymph: No excessive bleeding, bruising or enlarged lymph nodes.  Physical Exam  BP 128/80   Pulse 88   Temp (!) 97.1 F (36.2 C)   Resp 16   Ht 5' 7.25" (1.708 m)   Wt 197 lb  12.8 oz (89.7 kg)   BMI 30.75 kg/m   General Appearance: Over nourished and well groomed and in no apparent distress.  Eyes: PERRLA, EOMs,  conjunctiva no swelling or erythema, normal fundi and vessels. Sinuses: No frontal/maxillary tenderness ENT/Mouth: EACs patent / TMs  nl. Nares clear without erythema, swelling, mucoid exudates. Oral hygiene is good. No erythema, swelling, or exudate. Tongue normal, non-obstructing. Tonsils not swollen or erythematous. Hearing normal.  Neck: Supple, thyroid not palpable. No bruits, nodes or JVD. Respiratory: Respiratory effort normal.  BS equal and clear bilateral without rales, rhonci, wheezing or stridor. Cardio: Heart sounds are normal with regular rate and rhythm and no murmurs, rubs or gallops. Peripheral pulses are normal and equal bilaterally without edema. No aortic or femoral bruits. Chest: symmetric with normal excursions and percussion.  Abdomen: Soft, with Nl bowel sounds. Nontender, no guarding, rebound, hernias, masses, or organomegaly.  Lymphatics: Non tender without lymphadenopathy.  Musculoskeletal: Full ROM all peripheral extremities, joint stability, 5/5 strength, and normal gait. Skin: Warm and dry without rashes, lesions, cyanosis, clubbing or  ecchymosis.  Neuro: Cranial nerves intact, reflexes equal bilaterally. Normal muscle tone, no cerebellar symptoms. Sensation intact.  Pysch: Alert and oriented X 3 with normal affect, insight and judgment appropriate.   Assessment and Plan  1. Annual Preventative/Screening Exam   2. Essential hypertension  - EKG 12-Lead - [place], RETROPERITNL ABD,  LTD - Urinalysis, Routine w reflex microscopic - Microalbumin / creatinine urine ratio - CBC with Differential/Platelet - COMPLETE METABOLIC PANEL WITH GFR - Magnesium - TSH  3. Hyperlipidemia, mixed  - EKG 12-Lead - [place], RETROPERITNL ABD,  LTD - Lipid panel - TSH  4. Type 2 diabetes mellitus with stage 2 chronic kidney disease, without  long-term current use of insulin (HCC)  - EKG 12-Lead - [place], RETROPERITNL ABD,  LTD - Urinalysis, Routine w reflex microscopic - Microalbumin / creatinine urine ratio - HM DIABETES FOOT EXAM - LOW EXTREMITY NEUR EXAM DOCUM - Hemoglobin A1c - Insulin, random  5. Vitamin D deficiency  - VITAMIN D 25 Hydroxyl  6. Gastroesophageal reflux disease  - CBC with Differential/Platelet  7. Non-ischemic cardiomyopathy ([place])  - EKG 12-Lead  8. Chronic systolic congestive heart failure (HCC)  - EKG 12-Lead  9. Chronic pain associated with significant psychosocial dysfunction  10. RSD (reflex sympathetic dystrophy)  11. Chronic bronchitis, unspecified chronic bronchitis type ([place])  12. 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 Atherosclerosis of aorta (HCC)  - EKG 12-Lead - [place], RETROPERITNL ABD,  LTD  15. Screening for ischemic heart disease  - EKG 12-Lead  16. Screening for AAA (aortic abdominal aneurysm)  - [place], RETROPERITNL ABD,  LTD  17. Screening for colorectal cancer  - POC Hemoccult Bld/Stl   18. Screening for malignant neoplasm of prostate  - PSA  19. Medication management  - Urinalysis, Routine w reflex microscopic - Microalbumin / creatinine urine ratio - CBC with Differential/Platelet - COMPLETE METABOLIC PANEL WITH GFR - Magnesium - Lipid panel - TSH - Hemoglobin A1c - Insulin, random - VITAMIN D 25 Hydroxyol         Patient was counseled in prudent diet, weight control to achieve/maintain BMI less than 25, BP monitoring, regular exercise and medications as discussed.  Discussed med effects and SE's. Routine screening labs and tests as requested with regular follow-up as recommended. Over 40 minutes of exam, counseling, chart review and high complex critical decision making was performed

## 2018-09-17 NOTE — Patient Instructions (Signed)

## 2018-09-18 ENCOUNTER — Other Ambulatory Visit: Payer: Self-pay

## 2018-09-18 ENCOUNTER — Ambulatory Visit: Payer: Medicare Other | Admitting: Internal Medicine

## 2018-09-18 VITALS — BP 128/80 | HR 88 | Temp 97.1°F | Resp 16 | Ht 67.25 in | Wt 197.8 lb

## 2018-09-18 DIAGNOSIS — Z125 Encounter for screening for malignant neoplasm of prostate: Secondary | ICD-10-CM

## 2018-09-18 DIAGNOSIS — N401 Enlarged prostate with lower urinary tract symptoms: Secondary | ICD-10-CM

## 2018-09-18 DIAGNOSIS — I5022 Chronic systolic (congestive) heart failure: Secondary | ICD-10-CM

## 2018-09-18 DIAGNOSIS — Z0000 Encounter for general adult medical examination without abnormal findings: Secondary | ICD-10-CM | POA: Diagnosis not present

## 2018-09-18 DIAGNOSIS — Z136 Encounter for screening for cardiovascular disorders: Secondary | ICD-10-CM | POA: Diagnosis not present

## 2018-09-18 DIAGNOSIS — Z79899 Other long term (current) drug therapy: Secondary | ICD-10-CM

## 2018-09-18 DIAGNOSIS — Z1211 Encounter for screening for malignant neoplasm of colon: Secondary | ICD-10-CM

## 2018-09-18 DIAGNOSIS — N182 Chronic kidney disease, stage 2 (mild): Secondary | ICD-10-CM

## 2018-09-18 DIAGNOSIS — Z87891 Personal history of nicotine dependence: Secondary | ICD-10-CM

## 2018-09-18 DIAGNOSIS — K219 Gastro-esophageal reflux disease without esophagitis: Secondary | ICD-10-CM

## 2018-09-18 DIAGNOSIS — I428 Other cardiomyopathies: Secondary | ICD-10-CM

## 2018-09-18 DIAGNOSIS — G9050 Complex regional pain syndrome I, unspecified: Secondary | ICD-10-CM

## 2018-09-18 DIAGNOSIS — E559 Vitamin D deficiency, unspecified: Secondary | ICD-10-CM

## 2018-09-18 DIAGNOSIS — E1122 Type 2 diabetes mellitus with diabetic chronic kidney disease: Secondary | ICD-10-CM

## 2018-09-18 DIAGNOSIS — E782 Mixed hyperlipidemia: Secondary | ICD-10-CM

## 2018-09-18 DIAGNOSIS — G894 Chronic pain syndrome: Secondary | ICD-10-CM

## 2018-09-18 DIAGNOSIS — I10 Essential (primary) hypertension: Secondary | ICD-10-CM | POA: Diagnosis not present

## 2018-09-18 DIAGNOSIS — I700 Atherosclerosis of aorta: Secondary | ICD-10-CM

## 2018-09-18 DIAGNOSIS — Z0001 Encounter for general adult medical examination with abnormal findings: Secondary | ICD-10-CM

## 2018-09-18 DIAGNOSIS — J42 Unspecified chronic bronchitis: Secondary | ICD-10-CM

## 2018-09-18 DIAGNOSIS — N138 Other obstructive and reflux uropathy: Secondary | ICD-10-CM

## 2018-09-18 LAB — HM DIABETES EYE EXAM

## 2018-09-19 LAB — MAGNESIUM: Magnesium: 2 mg/dL (ref 1.5–2.5)

## 2018-09-19 LAB — COMPLETE METABOLIC PANEL WITH GFR
AG Ratio: 1.6 (calc) (ref 1.0–2.5)
ALT: 14 U/L (ref 9–46)
AST: 14 U/L (ref 10–35)
Albumin: 4.5 g/dL (ref 3.6–5.1)
Alkaline phosphatase (APISO): 95 U/L (ref 35–144)
BUN: 17 mg/dL (ref 7–25)
CO2: 29 mmol/L (ref 20–32)
Calcium: 10 mg/dL (ref 8.6–10.3)
Chloride: 99 mmol/L (ref 98–110)
Creat: 0.84 mg/dL (ref 0.70–1.18)
GFR, Est African American: 101 mL/min/{1.73_m2} (ref >=60)
GFR, Est Non African American: 87 mL/min/{1.73_m2} (ref >=60)
Globulin: 2.8 g/dL (calc) (ref 1.9–3.7)
Glucose, Bld: 225 mg/dL — ABNORMAL HIGH (ref 65–99)
Potassium: 5.4 mmol/L — ABNORMAL HIGH (ref 3.5–5.3)
Sodium: 138 mmol/L (ref 135–146)
Total Bilirubin: 0.5 mg/dL (ref 0.2–1.2)
Total Protein: 7.3 g/dL (ref 6.1–8.1)

## 2018-09-19 LAB — LIPID PANEL
Cholesterol: 153 mg/dL (ref <200)
HDL: 37 mg/dL — ABNORMAL LOW (ref >=40)
LDL Cholesterol (Calc): 88 mg/dL (calc)
Non-HDL Cholesterol (Calc): 116 mg/dL (calc) (ref <130)
Total CHOL/HDL Ratio: 4.1 (calc) (ref <5.0)
Triglycerides: 186 mg/dL — ABNORMAL HIGH (ref <150)

## 2018-09-19 LAB — CBC WITH DIFFERENTIAL/PLATELET
Absolute Monocytes: 500 cells/uL (ref 200–950)
Basophils Absolute: 39 cells/uL (ref 0–200)
Basophils Relative: 0.4 %
Eosinophils Absolute: 323 cells/uL (ref 15–500)
Eosinophils Relative: 3.3 %
HCT: 43.2 % (ref 38.5–50.0)
Hemoglobin: 14.2 g/dL (ref 13.2–17.1)
Lymphs Abs: 2891 cells/uL (ref 850–3900)
MCH: 28.2 pg (ref 27.0–33.0)
MCHC: 32.9 g/dL (ref 32.0–36.0)
MCV: 85.9 fL (ref 80.0–100.0)
MPV: 11.1 fL (ref 7.5–12.5)
Monocytes Relative: 5.1 %
Neutro Abs: 6047 cells/uL (ref 1500–7800)
Neutrophils Relative %: 61.7 %
Platelets: 251 10*3/uL (ref 140–400)
RBC: 5.03 10*6/uL (ref 4.20–5.80)
RDW: 13.1 % (ref 11.0–15.0)
Total Lymphocyte: 29.5 %
WBC: 9.8 10*3/uL (ref 3.8–10.8)

## 2018-09-19 LAB — TSH: TSH: 2.93 mIU/L (ref 0.40–4.50)

## 2018-09-19 LAB — URINALYSIS, ROUTINE W REFLEX MICROSCOPIC
Bilirubin Urine: NEGATIVE
Hgb urine dipstick: NEGATIVE
Ketones, ur: NEGATIVE
Leukocytes,Ua: NEGATIVE
Nitrite: NEGATIVE
Protein, ur: NEGATIVE
Specific Gravity, Urine: 1.035 (ref 1.001–1.03)
pH: <=5 (ref 5.0–8.0)

## 2018-09-19 LAB — HEMOGLOBIN A1C
Hgb A1c MFr Bld: 9.6 % of total Hgb — ABNORMAL HIGH (ref <5.7)
Mean Plasma Glucose: 229 (calc)
eAG (mmol/L): 12.7 (calc)

## 2018-09-19 LAB — MICROALBUMIN / CREATININE URINE RATIO
Creatinine, Urine: 36 mg/dL (ref 20–320)
Microalb Creat Ratio: 11 mcg/mg creat (ref <30)
Microalb, Ur: 0.4 mg/dL

## 2018-09-19 LAB — INSULIN, RANDOM: Insulin: 15.2 u[IU]/mL

## 2018-09-19 LAB — VITAMIN D 25 HYDROXY (VIT D DEFICIENCY, FRACTURES): Vit D, 25-Hydroxy: 61 ng/mL (ref 30–100)

## 2018-09-19 LAB — PSA: PSA: 0.4 ng/mL (ref <=4.0)

## 2018-10-03 ENCOUNTER — Ambulatory Visit: Payer: Self-pay | Admitting: Adult Health

## 2018-10-11 ENCOUNTER — Other Ambulatory Visit: Payer: Self-pay | Admitting: Adult Health

## 2018-11-05 ENCOUNTER — Other Ambulatory Visit: Payer: Self-pay | Admitting: Internal Medicine

## 2018-11-05 DIAGNOSIS — E119 Type 2 diabetes mellitus without complications: Secondary | ICD-10-CM

## 2018-12-05 ENCOUNTER — Ambulatory Visit: Payer: Self-pay | Admitting: Adult Health

## 2018-12-20 ENCOUNTER — Ambulatory Visit: Payer: Self-pay | Admitting: Adult Health

## 2018-12-28 ENCOUNTER — Ambulatory Visit (INDEPENDENT_AMBULATORY_CARE_PROVIDER_SITE_OTHER): Payer: Medicare Other | Admitting: Cardiovascular Disease

## 2018-12-28 ENCOUNTER — Encounter: Payer: Self-pay | Admitting: Cardiovascular Disease

## 2018-12-28 ENCOUNTER — Other Ambulatory Visit: Payer: Self-pay

## 2018-12-28 DIAGNOSIS — I428 Other cardiomyopathies: Secondary | ICD-10-CM | POA: Diagnosis not present

## 2018-12-28 DIAGNOSIS — E782 Mixed hyperlipidemia: Secondary | ICD-10-CM | POA: Diagnosis not present

## 2018-12-28 DIAGNOSIS — I10 Essential (primary) hypertension: Secondary | ICD-10-CM | POA: Diagnosis not present

## 2018-12-28 NOTE — Assessment & Plan Note (Signed)
History of nonischemic cardiomyopathy with diagnostic cath performed by Dr. Martinique revealing noncritical CAD with an EF of 35%.  He was evaluated by Dr. Aundra Dubin and his medications were titrated.  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  He denies chest pain or shortness of breath.

## 2018-12-28 NOTE — Assessment & Plan Note (Signed)
History of essential hypertension with blood pressure measured today at 104/67.  He is on Entresto and spironolactone.  He apparently is no longer on carvedilol.

## 2018-12-28 NOTE — Assessment & Plan Note (Signed)
History of hyperlipidemia on statin therapy with lipid profile performed 09/18/2018 revealing total cholesterol 153, LDL of 88 and HDL 37.

## 2018-12-28 NOTE — Patient Instructions (Signed)

## 2018-12-28 NOTE — Progress Notes (Signed)
12/28/2018 James Parsons   July 21, 1945  [id number]  Primary Physician Unk Pinto, MD Primary Cardiologist: [name] MD FACP, [place], [place], [place]  HPI:  James Parsons is a 73 y.o.  mildly overweight married Caucasian male father of 2 sons, grandfather and 4 grandchildren who is here in follow-up of nonischemic cardiomyopathy.I last saw him in the office  10/03/2017.  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 His primary care provider is Dr. [name]. His cardiac risk factor profile is notable for over 100 pack years of tobacco abuse having quit 15 years ago. He has a history of treated hypertension, hyperlipidemia and diabetes. He never had a heart attack or stroke. He was hospitalized in early January of this year with chest pain and CHF. Cardiac catheterization performed by Dr. Martinique revealed noncritical CAD with an EF of 35%. By echo his EFwas40-45%. He is on appropriate medications. Hewas subsequently admitted after I last saw him on 08/29/16 for 3 days for decompensated heart failure. He was diuresed.A 2-D echo performed during his hospitalization revealed an ejection fraction of 25-30%.  He has seen Dr. Aundra Dubin in the office he was adjusting his medications.  He had ordered a cardiac MRI to further assess his LV function in anticipation of potential Bi V  ICD CRT therapy.  This could not be performed because of an indwelling swelling fentanyl pump however 2D echo performed 09/11/2017 revealed an increase in his EF to the 45 to 50% range obviating the need for an ICD.  He denies chest pain or shortness of breath.  Since I saw him a year ago he is remained stable.  He denies chest pain or shortness of breath.  He is no longer on his carvedilol.    Current Meds  Medication Sig  . aspirin 81 MG chewable tablet Chew 81 mg by mouth at bedtime.  James Parsons atorvastatin (LIPITOR) 80 MG tablet TAKE 0.5 TABLETS (40 MG TOTAL) BY MOUTH DAILY AT 6 PM.  .  Cholecalciferol (VITAMIN D-3) 5000 UNITS TABS Take 5,000 Units by mouth 2 (two) times daily.  . DULoxetine (CYMBALTA) 60 MG capsule Take 60 mg by mouth daily.   James Parsons ENTRESTO 49-51 MG TAKE 1 TABLET BY MOUTH TWICE A DAY  . folic acid (FOLVITE) 1 MG tablet Take 1 tablet (1 mg total) by mouth daily.  James Parsons glimepiride (AMARYL) 4 MG tablet TAKE 1 TABLET TWICE DAILY WITH MEALS. DO NOT TAKE IF YOU DO NOT EAT.  James Parsons JARDIANCE 10 MG TABS tablet TAKE 1 TABLET BY MOUTH EVERY DAY  . lidocaine (LIDODERM) 5 % Place 1 patch onto the skin daily as needed (pain).  . metFORMIN (GLUCOPHAGE-XR) 500 MG 24 hr tablet Take 2 tablets 2 x /day with Meals for Diabetes  . pregabalin (LYRICA) 150 MG capsule Take 150 mg by mouth 2 (two) times daily.  James Parsons PRESCRIPTION MEDICATION by Intrathecal route continuous. Fentanyl 2mg /ml solution . Daily dose 0.8820 mg Compounded at [facility] [phone] Pump is refilled monthly  . spironolactone (ALDACTONE) 25 MG tablet TAKE 1 TABLET BY MOUTH EVERYDAY AT BEDTIME  . Tapentadol HCl (NUCYNTA) 100 MG TABS Take 100 mg by mouth See admin instructions. Take 100 mg by mouth twice daily, may also take 100 mg extra during the day as needed for pain     Allergies  Allergen Reactions  . Codeine Itching  . Morphine And Related Other (See Comments)    hallucinations  Social History   Socioeconomic History  . Marital status: Married    Spouse name: karen  . Number of children: 2  . Years of education: Not on file  . Highest education level: Not on file  Occupational History  . Occupation: retired Management consultant  Social Needs  . Financial resource strain: Not on file  . Food insecurity    Worry: Not on file    Inability: Not on file  . Transportation needs    Medical: Not on file    Non-medical: Not on file  Tobacco Use  . Smoking status: Former Smoker    Packs/day: 3.00    Years: 40.00    Pack years: 120.00    Types: Cigarettes    Quit date:  05/25/2002    Years since quitting: 16.6  . Smokeless tobacco: Never Used  Substance and Sexual Activity  . Alcohol use: No  . Drug use: No  . Sexual activity: Never  Lifestyle  . Physical activity    Days per week: Not on file    Minutes per session: Not on file  . Stress: Not on file  Relationships  . Social Herbalist on phone: Not on file    Gets together: Not on file    Attends religious service: Not on file    Active member of club or organization: Not on file    Attends meetings of clubs or organizations: Not on file    Relationship status: Not on file  . Intimate partner violence    Fear of current or ex partner: Not on file    Emotionally abused: Not on file    Physically abused: Not on file    Forced sexual activity: Not on file  Other Topics Concern  . Not on file  Social History Narrative  . Not on file     Review of Systems: General: negative for chills, fever, night sweats or weight changes.  Cardiovascular: negative for chest pain, dyspnea on exertion, edema, orthopnea, palpitations, paroxysmal nocturnal dyspnea or shortness of breath Dermatological: negative for rash Respiratory: negative for cough or wheezing Urologic: negative for hematuria Abdominal: negative for nausea, vomiting, diarrhea, bright red blood per rectum, melena, or hematemesis Neurologic: negative for visual changes, syncope, or dizziness All other systems reviewed and are otherwise negative except as noted above.    Blood pressure 104/67, pulse 98, height 5' 8.6" (1.742 m), weight 200 lb (90.7 kg), SpO2 96 %.  General appearance: alert and no distress Neck: no adenopathy, no carotid bruit, no JVD, supple, symmetrical, trachea midline and thyroid not enlarged, symmetric, no tenderness/mass/nodules Lungs: clear to auscultation bilaterally Heart: regular rate and rhythm, S1, S2 normal, no murmur, click, rub or gallop Extremities: extremities normal, atraumatic, no cyanosis or  edema Pulses: 2+ and symmetric Skin: Skin color, texture, turgor normal. No rashes or lesions Neurologic: Alert and oriented X 3, normal strength and tone. Normal symmetric reflexes. Normal coordination and gait  EKG not performed today blood pressures a little low to restart  ASSESSMENT AND PLAN:   Mixed hyperlipidemia History of hyperlipidemia on statin therapy with lipid profile performed 09/18/2018 revealing total cholesterol 153, LDL of 88 and HDL 37.  Essential hypertension History of essential hypertension with blood pressure measured today at 104/67.  He is on Entresto and spironolactone.  He apparently is no longer on carvedilol.  Non-ischemic cardiomyopathy ([place]) History of nonischemic cardiomyopathy with diagnostic cath performed by Dr. Martinique revealing noncritical CAD with an  EF of 35%.  He was evaluated by Dr. Aundra Dubin and his medications were titrated.  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  He denies chest pain or shortness of breath.      [name] MD FACP,FACC,FAHA, [facility] 12/28/2018 11:59 AM

## 2019-02-20 DIAGNOSIS — E1122 Type 2 diabetes mellitus with diabetic chronic kidney disease: Secondary | ICD-10-CM | POA: Insufficient documentation

## 2019-02-20 DIAGNOSIS — N182 Chronic kidney disease, stage 2 (mild): Secondary | ICD-10-CM | POA: Insufficient documentation

## 2019-02-20 NOTE — Progress Notes (Deleted)
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   Essential hypertension Continue medication Monitor blood pressure at home; call if consistently over 130/80 Continue DASH diet.   Reminder to go to the ER if any CP, SOB, nausea, dizziness, severe HA, changes vision/speech, left arm numbness and tingling and jaw pain.  CAD in native artery Control blood pressure, cholesterol, glucose, increase exercise. Denies dyspnea or angina  Followed by cardiology  Atherosclerosis of aorta [facility]) Per CT 2018 Control blood pressure, cholesterol, glucose, increase exercise.   Chronic obstructive pulmonary disease, unspecified COPD type ([place]) Avoid triggers, continue inhaler as needed ***  Gastroesophageal reflux disease, esophagitis presence not specified; with stricture Avoid triggers, continue PPI, followed by GI  Esophageal stricture Hyperlipidemia assoicated with T2DM Continue medications: atorvastatin 40 mg daily ** Near goal of LDL <70 Continue low cholesterol diet and exercise.  Check lipid panel.   Poorly controlled [id number] with complications [facility]) Education: Reviewed 'ABCs' of diabetes management (respective goals in parentheses):  A1C (<7), blood pressure (<130/80), and cholesterol (LDL <70) Reminded Eye Exam yearly and Dental Exam every 6 months. Dietary/physical activity recommendations reviewed LONG DISCUSSION ABOUT THIS AND RISK OF STROKE NEED TO CHECK SUGARS MORE OFTEN  CKD 2 associated with T2DM (HCC) Increase fluids, avoid NSAIDS, monitor sugars, will monitor CMP/GFR  Vitamin D deficiency Near goal at recent check; continue to recommend supplementation for goal of 60-100 Defer vitamin D  level  Overweight; BMI 29 *** Long discussion about weight loss, diet, and exercise Recommended diet heavy in fruits and veggies and low in animal meats, cheeses, and dairy products, appropriate calorie intake Discussed appropriate weight for height  Follow up at next visit  Medication management CBC, CMP/GFR, magnesium  Failed back syndrome of lumbar spine  Followed by ortho/pain management  Over 30 minutes of exam, counseling, chart review, and critical decision making was performed  Future Appointments  Date Time Provider [place]  02/21/2019  3:00 PM Liane Comber, NP [facility] None  03/21/2019  2:30 PM Unk Pinto, MD [facility] None  10/07/2019  3:00 PM Unk Pinto, MD [facility] None     Subjective:  James Parsons is a 73 y.o. male who presents for 3 month follow up for HTN, CHF, hyperlipidemia, diabetes, and vitamin D Def.   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 Last surgery was a lumbar fusion in 01/2015 by Dr Rolena Infante.  He did have a spinal cord stimulator implanted in 2005, but no longer uses it, and follows with [facility]. Currently treated by ***  He is a former smoker (100+ pack year history, quit in 2004) with consequent COPD on imaging though ***  BMI is There is no height or weight on file to calculate BMI.  He has a history of Combined Systolic and Diastolic, denies dyspnea on exertion, orthopnea, paroxysmal nocturnal dyspnea and edema. Positive for none. Wt Readings from Last 3 Encounters:  12/28/18 200 lb (90.7 kg)  09/18/18 197 lb 12.8 oz (89.7 kg)  06/04/18 192 lb (87.1 kg)   In Jan 2018,  he was hospitalized with Acute/chronic CHF and Heart cath showed non-obstructive CAD. He additionally has aortic atherosclerosis noted on  imaging (CT 07/25/2016, etc).  Patient is followed by Dr. Gwenlyn Found with most recent ECHO 09/2017 showing LV  EF improved from 35% to 45-50%   His blood pressure has been controlled at home, today their BP is   He does not workout. He denies chest pain, shortness of breath, dizziness.   He is on cholesterol medication (atorvastatin 40 mg daily) and denies myalgias. His cholesterol is not at goal of LDL <70. The cholesterol last visit was:   Lab Results  Component Value Date   CHOL 153 09/18/2018   HDL 37 (L) 09/18/2018   LDLCALC 88 09/18/2018   LDLDIRECT 178.6 09/08/2009   TRIG 186 (H) 09/18/2018   CHOLHDL 4.1 09/18/2018   He has been working on diet for T2DM on metformin 2000 total a day, jardiance 10mg  and glimepiride 4 mg BID, *** and denies foot ulcerations, increased appetite, nausea, paresthesia of the feet, polydipsia, polyuria, visual disturbances, vomiting and weight loss. Checking blood sugars daily but wife does and he does not know how they are. Last A1C in the office was:  Lab Results  Component Value Date   HGBA1C 9.6 (H) 09/18/2018   He has stable CKD II monitored q52m through this office  Lab Results  Component Value Date   GFRNONAA 87 09/18/2018    Patient is on Vitamin D supplement and at goal at last check:    Lab Results  Component Value Date   VD25OH 61 09/18/2018      Medication Review:  Current Outpatient Medications (Endocrine & Metabolic):  .  glimepiride (AMARYL) 4 MG tablet, TAKE 1 TABLET TWICE DAILY WITH MEALS. DO NOT TAKE IF YOU DO NOT EAT. [name]  JARDIANCE 10 MG TABS tablet, TAKE 1 TABLET BY MOUTH EVERY DAY .  metFORMIN (GLUCOPHAGE-XR) 500 MG 24 hr tablet, Take 2 tablets 2 x /day with Meals for Diabetes  Current Outpatient Medications (Cardiovascular):  .  atorvastatin (LIPITOR) 80 MG tablet, TAKE 0.5 TABLETS (40 MG TOTAL) BY MOUTH DAILY AT 6 PM. (Patient taking differently: Take 80 mg by mouth daily at 6 PM. ) .  ENTRESTO 49-51 MG, TAKE 1 TABLET BY MOUTH TWICE A DAY (Patient taking differently: 0.5 tablets. ) .  spironolactone (ALDACTONE) 25 MG tablet, TAKE 1  TABLET BY MOUTH EVERYDAY AT BEDTIME   Current Outpatient Medications (Analgesics):  .  aspirin 81 MG chewable tablet, Chew 81 mg by mouth at bedtime. .  Tapentadol HCl (NUCYNTA) 100 MG TABS, Take 100 mg by mouth See admin instructions. Take 100 mg by mouth twice daily, may also take 100 mg extra during the day as needed for pain  Current Outpatient Medications (Hematological):  .  folic acid (FOLVITE) 1 MG tablet, Take 1 tablet (1 mg total) by mouth daily.  Current Outpatient Medications (Other):  [name]  Cholecalciferol (VITAMIN D-3) 5000 UNITS TABS, Take 5,000 Units by mouth 2 (two) times daily. .  DULoxetine (CYMBALTA) 60 MG capsule, Take 60 mg by mouth daily.  [name]  lidocaine (LIDODERM) 5 %, Place 1 patch onto the skin daily as needed (pain). .  pregabalin (LYRICA) 150 MG capsule, Take 150 mg by mouth 2 (two) times daily. [name]  PRESCRIPTION MEDICATION, by Intrathecal route continuous. Fentanyl 2mg /ml solution . Daily dose 0.8820 mg Compounded at [facility] [phone] Pump is refilled monthly  Allergies: Allergies  Allergen Reactions  . Codeine Itching  . Morphine And Related Other (See Comments)    hallucinations    Current Problems (verified) has  COLONIC POLYPS; Hyperlipidemia associated with type 2 diabetes mellitus ([place]); Essential hypertension; Venous (peripheral) insufficiency; COPD (chronic obstructive pulmonary disease) ([place]); Diverticulosis of large intestine; LOW BACK PAIN SYNDROME; DJD (degenerative joint disease); T2_NIDDM; Anemia; Vitamin D deficiency; Medication management; Failed back syndrome of lumbar spine; Chronic pain associated with significant psychosocial dysfunction; Spinal stenosis, multilevel; Spinal cord stimulator status; RSD (reflex sympathetic dystrophy); Overweight (BMI 25.0-29.9); Encounter for Medicare annual wellness exam; Cervical arthritis; Non-ischemic cardiomyopathy ([place]); CAD in native artery; LBBB (left bundle branch block);  Atherosclerosis of aorta ([place]); Systolic CHF ([place]); Dysphagia; Esophageal stricture; Gastroesophageal reflux disease; Hypertensive heart disease with heart failure ([place]); and CKD stage 2 due to type 2 diabetes mellitus ([place]) on their problem list.  Social history  He reports that he quit smoking about 16 years ago. His smoking use included cigarettes. He has a 120.00 pack-year smoking history. He has never used smokeless tobacco. He reports that he does not drink alcohol or use drugs.    Objective:   There were no vitals filed for this visit. There is no height or weight on file to calculate BMI.  General appearance: alert, no distress, WD/WN, male HEENT: normocephalic, sclerae anicteric, TMs pearly, nares patent, no discharge or erythema, pharynx normal Oral cavity: MMM, no lesions Neck: supple, no lymphadenopathy, no thyromegaly, no masses Heart: RRR, normal S1, S2, distant heart sounds Lungs: CTA bilaterally, no wheezes, rhonchi, or rales Abdomen: +bs, soft, non tender, non distended, no masses, no hepatomegaly, no splenomegaly Musculoskeletal: nontender, no swelling, no obvious deformity Extremities: no edema, no cyanosis, no clubbing Pulses: 2+ symmetric, upper and lower extremities, normal cap refill *** Neurological: alert, oriented x 3, CN2-12 intact, strength normal upper extremities and lower extremities, sensation normal throughout,  Gait slow with cane Psychiatric: normal affect, behavior normal, pleasant     Izora Ribas, NP   02/20/2019

## 2019-02-21 ENCOUNTER — Ambulatory Visit: Payer: Self-pay | Admitting: Adult Health

## 2019-03-18 NOTE — Progress Notes (Addendum)
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   Essential hypertension Continue medication Monitor blood pressure at home; call if consistently over 130/80 Continue DASH diet.   Reminder to go to the ER if any CP, SOB, nausea, dizziness, severe HA, changes vision/speech, left arm numbness and tingling and jaw pain.  CAD in native artery Control blood pressure, cholesterol, glucose, increase exercise. Denies dyspnea or angina  Followed by cardiology  Atherosclerosis of aorta [facility]) Per CT 2018 Control blood pressure, cholesterol, glucose, increase exercise.   Chronic obstructive pulmonary disease, unspecified COPD type ([place]) Per imaging; denies symptoms; monitor  Gastroesophageal reflux disease, esophagitis presence not specified; with stricture Avoid triggers, continue PPI, followed by GI  Esophageal stricture Continue PPI, followed by GI   Hyperlipidemia assoicated with T2DM Medications: atorvastatin 80 mg daily - consider switch to rosuvastatin 40 mg Near goal of LDL <70 Continue low cholesterol diet and exercise.  Check lipid panel.   Poorly controlled [id number] with complications [facility]) Education: Reviewed 'ABCs' of diabetes management (respective goals in parentheses):  A1C (<7), blood pressure (<130/80), and cholesterol (LDL <70) Reminded Eye Exam yearly and Dental Exam every 6 months - report requested from Dr. Katy Fitch Dietary/physical activity recommendations reviewed LONG DISCUSSION ABOUT THIS AND RISK OF STROKE NEED TO BRING SUGAR LOG Follow up in 6 weeks if A1C 9+ with glucose log Patient is confident he can cut down on soda/sweets and improve this significantly  CKD 2 associated with  T2DM (HCC) Increase fluids, avoid NSAIDS, monitor sugars, will monitor CMP/GFR  Vitamin D deficiency Near goal at recent check; continue to recommend supplementation for goal of 60-100 Defer vitamin D level  Obesity; BMI 30 Long discussion about weight loss, diet, and exercise Recommended diet heavy in fruits and veggies and low in animal meats, cheeses, and dairy products, appropriate calorie intake Discussed appropriate weight for height  Follow up at next visit  Medication management CBC, CMP/GFR, magnesium  Failed back syndrome of lumbar spine/OA/ reflex sympathetic dystrophy  Followed by ortho/pain management  Over 30 minutes of exam, counseling, chart review, and critical decision making was performed  Future Appointments  Date Time Provider [place]  03/20/2019  3:00 PM Liane Comber, NP [facility] None  10/07/2019  3:00 PM Unk Pinto, MD [facility] None     Subjective:  James Parsons is a 73 y.o. male who presents for 3 month follow up for HTN, CHF, hyperlipidemia, diabetes, and vitamin D Def.   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 Last surgery was a lumbar fusion in 01/2015 by Dr Rolena Infante.  He did have a spinal cord stimulator implanted in 2005, but no longer uses it, and follows with [facility]. Currently treated by fentanyl patches.   He is a former smoker (100+ pack year history, quit in 2004) with consequent COPD on imaging though patient denies any symptoms, not on inhaler.   BMI is Body mass index is 30.4 kg/m. He has a history of Combined Systolic and Diastolic, denies dyspnea on exertion, orthopnea, paroxysmal nocturnal dyspnea and edema. Positive for none. Wt Readings from Last 3 Encounters:  03/20/19 197 lb (89.4 kg)  12/28/18 200 lb (90.7  kg)  09/18/18 197 lb 12.8 oz (89.7 kg)   In Jan 2018,  he was hospitalized with Acute/chronic  CHF and Heart cath showed non-obstructive CAD with mod/severe systolic CHF. ECHO 09/2017 showing LV EF improved from 35% to 45-50%. He additionally has aortic atherosclerosis noted on imaging (CT 07/25/2016, etc).  Patient is followed by Dr. Gwenlyn Found, has had LBBB and discussed biventricular CRT implantation but was deferred with most recent ECHO 09/2017 showing improved EF  His blood pressure has been controlled at home, today their BP is BP: 110/72 He does not workout. He denies chest pain, shortness of breath, dizziness.   He is on cholesterol medication (atorvastatin 80 mg daily) and denies myalgias. His cholesterol is not at goal of LDL <70. The cholesterol last visit was:   Lab Results  Component Value Date   CHOL 153 09/18/2018   HDL 37 (L) 09/18/2018   LDLCALC 88 09/18/2018   LDLDIRECT 178.6 09/08/2009   TRIG 186 (H) 09/18/2018   CHOLHDL 4.1 09/18/2018   He admits has not been working on diet for T2DM, some soda, lots of sweets, is on metformin 2000 total a day, jardiance 10mg  and glimepiride 4 mg BID and denies foot ulcerations, increased appetite, nausea, paresthesia of the feet, polydipsia, polyuria, visual disturbances, vomiting and weight loss. Checking blood sugars daily but wife does and he does not know how they are, keeps a log but didn't bring. Last A1C in the office was:  Lab Results  Component Value Date   HGBA1C 9.6 (H) 09/18/2018   He has stable CKD II monitored q61m through this office  Lab Results  Component Value Date   GFRNONAA 87 09/18/2018    Patient is on Vitamin D supplement and at goal at last check:    Lab Results  Component Value Date   VD25OH 61 09/18/2018      Medication Review:  Current Outpatient Medications (Endocrine & Metabolic):  .  glimepiride (AMARYL) 4 MG tablet, TAKE 1 TABLET TWICE DAILY WITH MEALS. DO NOT TAKE IF YOU DO NOT EAT. [name]  JARDIANCE 10 MG TABS tablet, TAKE 1 TABLET BY MOUTH EVERY DAY .  metFORMIN (GLUCOPHAGE-XR) 500 MG 24 hr tablet,  Take 2 tablets 2 x /day with Meals for Diabetes  Current Outpatient Medications (Cardiovascular):  .  atorvastatin (LIPITOR) 80 MG tablet, TAKE 0.5 TABLETS (40 MG TOTAL) BY MOUTH DAILY AT 6 PM. (Patient taking differently: Take 80 mg by mouth daily at 6 PM. ) .  ENTRESTO 49-51 MG, TAKE 1 TABLET BY MOUTH TWICE A DAY (Patient taking differently: 0.5 tablets. ) .  spironolactone (ALDACTONE) 25 MG tablet, TAKE 1 TABLET BY MOUTH EVERYDAY AT BEDTIME   Current Outpatient Medications (Analgesics):  .  aspirin 81 MG chewable tablet, Chew 81 mg by mouth at bedtime. .  Tapentadol HCl (NUCYNTA) 100 MG TABS, Take 100 mg by mouth See admin instructions. Take 100 mg by mouth twice daily, may also take 100 mg extra during the day as needed for pain  Current Outpatient Medications (Hematological):  .  folic acid (FOLVITE) 1 MG tablet, Take 1 tablet (1 mg total) by mouth daily.  Current Outpatient Medications (Other):  [name]  Cholecalciferol (VITAMIN D-3) 5000 UNITS TABS, Take 5,000 Units by mouth 2 (two) times daily. .  DULoxetine (CYMBALTA) 60 MG capsule, Take 60 mg by mouth daily.  [name]  lidocaine (LIDODERM) 5 %, Place 1 patch onto the skin daily as needed (pain). .  pregabalin (LYRICA) 150 MG capsule,  Take 150 mg by mouth 2 (two) times daily. [name]  PRESCRIPTION MEDICATION, by Intrathecal route continuous. Fentanyl 2mg /ml solution . Daily dose 0.8820 mg Compounded at [place] [phone] Pump is refilled monthly  Allergies: Allergies  Allergen Reactions  . Codeine Itching  . Morphine And Related Other (See Comments)    hallucinations     Current Problems (verified) has COLONIC POLYPS; Hyperlipidemia associated with type 2 diabetes mellitus ([place]); Essential hypertension; Venous (peripheral) insufficiency; COPD (chronic obstructive pulmonary disease) ([place]); Diverticulosis of large intestine; LOW BACK PAIN SYNDROME; DJD (degenerative joint disease); Poorly controlled type 2 diabetes  mellitus ([place]); Anemia; Vitamin D deficiency; Medication management; Failed back syndrome of lumbar spine; Chronic pain associated with significant psychosocial dysfunction; Spinal stenosis, multilevel; Spinal cord stimulator status; RSD (reflex sympathetic dystrophy); Overweight (BMI 25.0-29.9); Encounter for Medicare annual wellness exam; Cervical arthritis; Non-ischemic cardiomyopathy ([place]); CAD in native artery; LBBB (left bundle branch block); Atherosclerosis of aorta ([place]); Systolic CHF ([place]); Dysphagia; Esophageal stricture; Gastroesophageal reflux disease; Hypertensive heart disease with heart failure ([place]); and CKD stage 2 due to type 2 diabetes mellitus ([place]) on their problem list.  Social history  He reports that he quit smoking about 16 years ago. His smoking use included cigarettes. He has a 120.00 pack-year smoking history. He has never used smokeless tobacco. He reports that he does not drink alcohol or use drugs.   Review of Systems  Constitutional: Negative for malaise/fatigue and weight loss.  HENT: Negative for hearing loss and tinnitus.   Eyes: Negative for blurred vision and double vision.  Respiratory: Negative for cough, sputum production, shortness of breath and wheezing.   Cardiovascular: Negative for chest pain, palpitations, orthopnea, claudication, leg swelling and PND.  Gastrointestinal: Negative for abdominal pain, blood in stool, constipation, diarrhea, heartburn, melena, nausea and vomiting.  Genitourinary: Negative.   Musculoskeletal: Positive for back pain and joint pain. Negative for falls and myalgias.  Skin: Negative for rash.  Neurological: Negative for dizziness, tingling, sensory change, weakness and headaches.  Endo/Heme/Allergies: Negative for polydipsia.  Psychiatric/Behavioral: Negative.  Negative for depression, memory loss, substance abuse and suicidal ideas. The patient is not nervous/anxious and does not have insomnia.   All other systems reviewed  and are negative.    Objective:   Today's Vitals   03/20/19 1407 03/20/19 1430  BP: 110/72   Pulse: (!) 115 (!) 110  Temp: (!) 97.3 F (36.3 C)   SpO2: 97%   Weight: 197 lb (89.4 kg)   Height: 5' 7.5" (1.715 m)    Body mass index is 30.4 kg/m.  General appearance: alert, no distress, WD/WN, male HEENT: normocephalic, sclerae anicteric, TMs pearly, nares patent, no discharge or erythema, pharynx normal Oral cavity: MMM, no lesions Neck: supple, no lymphadenopathy, no thyromegaly, no masses Heart: RRR, normal S1, S2, distant heart sounds Lungs: CTA bilaterally, no wheezes, rhonchi, or rales Abdomen: +bs, soft, non tender, non distended, no masses, no hepatomegaly, no splenomegaly Musculoskeletal: nontender, no swelling, no obvious deformity Extremities: no edema, no cyanosis, no clubbing Pulses: 2+ symmetric, upper and lower extremities, normal cap refill  Neurological: alert, oriented x 3, CN2-12 intact, strength normal upper extremities and lower extremities, sensation normal throughout,  Gait slow with cane Psychiatric: normal affect, behavior normal, pleasant     Izora Ribas, NP   03/20/2019

## 2019-03-20 ENCOUNTER — Ambulatory Visit: Payer: Medicare Other | Admitting: Adult Health

## 2019-03-20 ENCOUNTER — Other Ambulatory Visit: Payer: Self-pay

## 2019-03-20 ENCOUNTER — Encounter: Payer: Self-pay | Admitting: Adult Health

## 2019-03-20 VITALS — BP 110/72 | HR 110 | Temp 97.3°F | Ht 67.5 in | Wt 197.0 lb

## 2019-03-20 DIAGNOSIS — I5022 Chronic systolic (congestive) heart failure: Secondary | ICD-10-CM

## 2019-03-20 DIAGNOSIS — E1122 Type 2 diabetes mellitus with diabetic chronic kidney disease: Secondary | ICD-10-CM

## 2019-03-20 DIAGNOSIS — G9050 Complex regional pain syndrome I, unspecified: Secondary | ICD-10-CM

## 2019-03-20 DIAGNOSIS — I10 Essential (primary) hypertension: Secondary | ICD-10-CM

## 2019-03-20 DIAGNOSIS — M961 Postlaminectomy syndrome, not elsewhere classified: Secondary | ICD-10-CM

## 2019-03-20 DIAGNOSIS — D649 Anemia, unspecified: Secondary | ICD-10-CM

## 2019-03-20 DIAGNOSIS — I447 Left bundle-branch block, unspecified: Secondary | ICD-10-CM

## 2019-03-20 DIAGNOSIS — I110 Hypertensive heart disease with heart failure: Secondary | ICD-10-CM

## 2019-03-20 DIAGNOSIS — I428 Other cardiomyopathies: Secondary | ICD-10-CM

## 2019-03-20 DIAGNOSIS — E1169 Type 2 diabetes mellitus with other specified complication: Secondary | ICD-10-CM

## 2019-03-20 DIAGNOSIS — K5730 Diverticulosis of large intestine without perforation or abscess without bleeding: Secondary | ICD-10-CM

## 2019-03-20 DIAGNOSIS — I700 Atherosclerosis of aorta: Secondary | ICD-10-CM

## 2019-03-20 DIAGNOSIS — D126 Benign neoplasm of colon, unspecified: Secondary | ICD-10-CM

## 2019-03-20 DIAGNOSIS — R1310 Dysphagia, unspecified: Secondary | ICD-10-CM

## 2019-03-20 DIAGNOSIS — I872 Venous insufficiency (chronic) (peripheral): Secondary | ICD-10-CM

## 2019-03-20 DIAGNOSIS — J42 Unspecified chronic bronchitis: Secondary | ICD-10-CM

## 2019-03-20 DIAGNOSIS — G894 Chronic pain syndrome: Secondary | ICD-10-CM

## 2019-03-20 DIAGNOSIS — K219 Gastro-esophageal reflux disease without esophagitis: Secondary | ICD-10-CM

## 2019-03-20 DIAGNOSIS — M1990 Unspecified osteoarthritis, unspecified site: Secondary | ICD-10-CM

## 2019-03-20 DIAGNOSIS — N182 Chronic kidney disease, stage 2 (mild): Secondary | ICD-10-CM

## 2019-03-20 DIAGNOSIS — K222 Esophageal obstruction: Secondary | ICD-10-CM

## 2019-03-20 DIAGNOSIS — E669 Obesity, unspecified: Secondary | ICD-10-CM

## 2019-03-20 DIAGNOSIS — Z79899 Other long term (current) drug therapy: Secondary | ICD-10-CM

## 2019-03-20 DIAGNOSIS — E785 Hyperlipidemia, unspecified: Secondary | ICD-10-CM

## 2019-03-20 DIAGNOSIS — I2510 Atherosclerotic heart disease of native coronary artery without angina pectoris: Secondary | ICD-10-CM

## 2019-03-20 DIAGNOSIS — E559 Vitamin D deficiency, unspecified: Secondary | ICD-10-CM

## 2019-03-20 DIAGNOSIS — E66811 Obesity, class 1: Secondary | ICD-10-CM

## 2019-03-20 DIAGNOSIS — E1165 Type 2 diabetes mellitus with hyperglycemia: Secondary | ICD-10-CM

## 2019-03-20 NOTE — Patient Instructions (Addendum)
Goals    . Fasting Blood Glucose <130    . HEMOGLOBIN A1C < 7    . LDL CALC < 70    . Weight (lb) < 185 lb (83.9 kg)        Goal is fasting glucose <130 consistently   Focus on reducing sugar, flour, rice, potatoes       Bad carbs also include fruit juice, alcohol, and sweet tea. These are empty calories that do not signal to your brain that you are full.   Please remember the good carbs are still carbs which convert into sugar. So please measure them out no more than 1/2-1 cup of rice, oatmeal, pasta, and beans  Veggies are however free foods! Pile them on.   Not all fruit is created equal. Please see the list below, the fruit at the bottom is higher in sugars than the fruit at the top. Please avoid all dried fruits.

## 2019-03-21 ENCOUNTER — Ambulatory Visit: Payer: Medicare Other | Admitting: Internal Medicine

## 2019-03-21 LAB — CBC WITH DIFFERENTIAL/PLATELET
Absolute Monocytes: 625 cells/uL (ref 200–950)
Basophils Absolute: 53 cells/uL (ref 0–200)
Basophils Relative: 0.4 %
Eosinophils Absolute: 386 cells/uL (ref 15–500)
Eosinophils Relative: 2.9 %
HCT: 45.3 % (ref 38.5–50.0)
Hemoglobin: 15.2 g/dL (ref 13.2–17.1)
Lymphs Abs: 2580 cells/uL (ref 850–3900)
MCH: 29.4 pg (ref 27.0–33.0)
MCHC: 33.6 g/dL (ref 32.0–36.0)
MCV: 87.6 fL (ref 80.0–100.0)
MPV: 10.8 fL (ref 7.5–12.5)
Monocytes Relative: 4.7 %
Neutro Abs: 9656 cells/uL — ABNORMAL HIGH (ref 1500–7800)
Neutrophils Relative %: 72.6 %
Platelets: 317 10*3/uL (ref 140–400)
RBC: 5.17 10*6/uL (ref 4.20–5.80)
RDW: 13 % (ref 11.0–15.0)
Total Lymphocyte: 19.4 %
WBC: 13.3 10*3/uL — ABNORMAL HIGH (ref 3.8–10.8)

## 2019-03-21 LAB — MAGNESIUM: Magnesium: 1.8 mg/dL (ref 1.5–2.5)

## 2019-03-21 LAB — COMPLETE METABOLIC PANEL WITH GFR
AG Ratio: 1.6 (calc) (ref 1.0–2.5)
ALT: 10 U/L (ref 9–46)
AST: 11 U/L (ref 10–35)
Albumin: 4.1 g/dL (ref 3.6–5.1)
Alkaline phosphatase (APISO): 83 U/L (ref 35–144)
BUN: 23 mg/dL (ref 7–25)
CO2: 25 mmol/L (ref 20–32)
Calcium: 9.8 mg/dL (ref 8.6–10.3)
Chloride: 100 mmol/L (ref 98–110)
Creat: 0.91 mg/dL (ref 0.70–1.18)
GFR, Est African American: 97 mL/min/{1.73_m2} (ref >=60)
GFR, Est Non African American: 83 mL/min/{1.73_m2} (ref >=60)
Globulin: 2.6 g/dL (calc) (ref 1.9–3.7)
Glucose, Bld: 261 mg/dL — ABNORMAL HIGH (ref 65–99)
Potassium: 4.8 mmol/L (ref 3.5–5.3)
Sodium: 138 mmol/L (ref 135–146)
Total Bilirubin: 0.4 mg/dL (ref 0.2–1.2)
Total Protein: 6.7 g/dL (ref 6.1–8.1)

## 2019-03-21 LAB — TSH: TSH: 1.31 mIU/L (ref 0.40–4.50)

## 2019-03-21 LAB — LIPID PANEL
Cholesterol: 149 mg/dL (ref <200)
HDL: 41 mg/dL (ref >=40)
LDL Cholesterol (Calc): 69 mg/dL (calc)
Non-HDL Cholesterol (Calc): 108 mg/dL (calc) (ref <130)
Total CHOL/HDL Ratio: 3.6 (calc) (ref <5.0)
Triglycerides: 326 mg/dL — ABNORMAL HIGH (ref <150)

## 2019-03-21 LAB — HEMOGLOBIN A1C
Hgb A1c MFr Bld: 10 % of total Hgb — ABNORMAL HIGH (ref <5.7)
Mean Plasma Glucose: 240 (calc)
eAG (mmol/L): 13.3 (calc)

## 2019-03-25 ENCOUNTER — Encounter: Payer: Self-pay | Admitting: Internal Medicine

## 2019-04-03 ENCOUNTER — Encounter: Payer: Self-pay | Admitting: Internal Medicine

## 2019-04-11 ENCOUNTER — Other Ambulatory Visit: Payer: Self-pay | Admitting: Adult Health

## 2019-04-11 ENCOUNTER — Telehealth: Payer: Self-pay

## 2019-04-11 MED ORDER — PROMETHAZINE-DM 6.25-15 MG/5ML PO SYRP: 5.0000 mL | ORAL_SOLUTION | Freq: Four times a day (QID) | ORAL | 1 refills | Status: DC | PRN

## 2019-04-11 MED ORDER — AZITHROMYCIN 250 MG PO TABS: ORAL_TABLET | ORAL | 1 refills | Status: AC

## 2019-04-11 NOTE — Telephone Encounter (Signed)
Patient tested positive for Covid. Has a fever of 102, cough, fatigue and muscle aches. Are there any meds or supplements he can take?

## 2019-04-11 NOTE — Telephone Encounter (Signed)
Patient informed. 

## 2019-04-15 DIAGNOSIS — J329 Chronic sinusitis, unspecified: Secondary | ICD-10-CM | POA: Diagnosis not present

## 2019-04-15 DIAGNOSIS — I5022 Chronic systolic (congestive) heart failure: Secondary | ICD-10-CM | POA: Diagnosis not present

## 2019-04-15 DIAGNOSIS — Z20822 Contact with and (suspected) exposure to covid-19: Secondary | ICD-10-CM | POA: Diagnosis not present

## 2019-04-15 DIAGNOSIS — U071 COVID-19: Secondary | ICD-10-CM | POA: Diagnosis not present

## 2019-04-15 DIAGNOSIS — R0602 Shortness of breath: Secondary | ICD-10-CM | POA: Diagnosis not present

## 2019-04-15 DIAGNOSIS — Z743 Need for continuous supervision: Secondary | ICD-10-CM | POA: Diagnosis not present

## 2019-04-15 DIAGNOSIS — M1990 Unspecified osteoarthritis, unspecified site: Secondary | ICD-10-CM | POA: Diagnosis not present

## 2019-04-15 DIAGNOSIS — Z7984 Long term (current) use of oral hypoglycemic drugs: Secondary | ICD-10-CM | POA: Diagnosis not present

## 2019-04-15 DIAGNOSIS — R531 Weakness: Secondary | ICD-10-CM | POA: Diagnosis not present

## 2019-04-15 DIAGNOSIS — R0902 Hypoxemia: Secondary | ICD-10-CM | POA: Diagnosis not present

## 2019-04-15 DIAGNOSIS — Z20828 Contact with and (suspected) exposure to other viral communicable diseases: Secondary | ICD-10-CM | POA: Diagnosis not present

## 2019-04-15 DIAGNOSIS — N400 Enlarged prostate without lower urinary tract symptoms: Secondary | ICD-10-CM | POA: Diagnosis not present

## 2019-04-15 DIAGNOSIS — Z885 Allergy status to narcotic agent status: Secondary | ICD-10-CM | POA: Diagnosis not present

## 2019-04-15 DIAGNOSIS — G9341 Metabolic encephalopathy: Secondary | ICD-10-CM | POA: Diagnosis not present

## 2019-04-15 DIAGNOSIS — R4182 Altered mental status, unspecified: Secondary | ICD-10-CM | POA: Diagnosis not present

## 2019-04-15 DIAGNOSIS — Z87891 Personal history of nicotine dependence: Secondary | ICD-10-CM | POA: Diagnosis not present

## 2019-04-15 NOTE — Telephone Encounter (Signed)
Called in response to concerning myChart message from wife, recent covid 50 + patient with numerous co morbidities. She reports O2 has improved to 95%, blood pressures have been normal for him, glucose rechecked regularly and stable, 136 this AM. However patient is lethargic, cannot keep him awake long enough to drink more than a few sips of water, had episode of fecal incontinence trying to go to the bathroom last evening. Concerning for very poor fluid intake. After discussion she is in agreement with ED evaluation; at minimum he may benefit from IV fluids, r/o covid 19 complications; she states will call EMS.

## 2019-04-17 ENCOUNTER — Encounter: Payer: Self-pay | Admitting: Adult Health

## 2019-04-17 DIAGNOSIS — Z8616 Personal history of COVID-19: Secondary | ICD-10-CM | POA: Insufficient documentation

## 2019-05-08 NOTE — Progress Notes (Signed)
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   Reminder to go to the ER if any CP, SOB, nausea, dizziness, severe HA, changes vision/speech, left arm numbness and tingling and jaw pain.  CAD in native artery Control blood pressure, cholesterol, glucose, increase exercise.  Followed by cardiology  Atherosclerosis of aorta ([place]) Per numerous CTs, 07/2016, etc Control blood pressure, cholesterol, glucose, increase exercise.  Followed by cardiology  Chronic obstructive pulmonary disease, unspecified COPD type ([place]) Avoid triggers, continue inhaler as needed, r/t smoking hx; monitor   Gastroesophageal reflux disease, esophagitis presence not specified Avoid triggers, continue PPI, followed by GI  Esophageal stricture Followed by GI, continue PPI  Dysphagia, unspecified type Reminded to take small bites, avoid eating while distracted Followed by GI   Benign neoplasm of colon, unspecified part of colon Up to date on colonoscopies, followed by GI  Controlled type 2 diabetes mellitus with diabetic nephropathy, without long-term current use of insulin [facility]) Education: Reviewed 'ABCs' of diabetes management (respective goals in parentheses):  A1C (<7), blood pressure (<130/80), and cholesterol (LDL <70) Reminded Eye Exam yearly and Dental Exam every 6 months. Dietary/physical activity  recommendations reviewed Strong patient preference to avoid insulin; discussed trial of trulicity for weight loss and A1C reduction; goal of <8% within 6 months discussed; initiate 0.75 mg weekly sub Q injections x 4 weeks, then contact office and will send in 1.5 mg if tolerating; information provided; administration reviewed  Fasting glucose goal <150; wife will assist Follow up as scheduled in 4-6 weeks   RSD (reflex sympathetic dystrophy) Followed by ortho/pain management  Osteoarthritis, unspecified osteoarthritis type, unspecified site Followed by ortho/pain management  Vitamin D deficiency Near goal at recent check; continue to recommend supplementation for goal of 70-100 Defer vitamin D level  Class 1 obesity due to excess calories with serious comorbidity and body mass index (BMI) of 30.0 to 30.9 in adult Long discussion about weight loss, diet, and exercise Recommended diet heavy in fruits and veggies and low in animal meats, cheeses, and dairy products, appropriate calorie intake Discussed appropriate weight for height  Follow up at next visit  Mixed hyperlipidemia associated with T2DM ([place]) Continue medications: atorvastatin LDL goal <70 Continue low cholesterol diet and exercise.  Check lipid panel q40m  CKD II associated with T2DM (HCC) Increase fluids, avoid NSAIDS, monitor sugars, will monitor  Medication management CBC, CMP/GFR, magnesium  Chronic bilateral low back pain without sciatica Followed by ortho/pain management  Diverticulosis of large intestine without hemorrhage Up to date on colonoscopies, increase fiber  Chronic pain associated with significant psychosocial dysfunction Followed by ortho/pain management  Memory changes Per wife ongoing 2-3 years, notable progression; MMSE 26/30 today  Check labs - Sed, ANA, B12, RPR; has recent normal CBC, TSH Initiate B12 sublingual supplement  CT head 2018 showed Mild generalized cerebral atrophy Per wife  preference will proceed with neuro referral and defer MRI imaging Very high risk for vascular etiology; 100+ pack/year smoker, poorly controlled diabetes, cardiac hx  Over 30 minutes  of exam, counseling, chart review, and critical decision making was performed  Future Appointments  Date Time Provider [place]  07/23/2019  2:30 PM Garnet Sierras, NP [facility] None  10/07/2019  3:00 PM Unk Pinto, MD [facility] None  04/07/2020  3:00 PM Liane Comber, NP [facility] None     Plan:   During the course of the visit the patient was educated and counseled about appropriate screening and preventive services including:    Pneumococcal vaccine   Influenza vaccine  Prevnar 13  Td vaccine  Screening electrocardiogram  Colorectal cancer screening  Diabetes screening  Glaucoma screening  Nutrition counseling    Subjective:  KRISTINA SCHRIVER is a 74 y.o. male who presents for Medicare Annual Wellness Visit and follow up for HTN, hyperlipidemia, diabetes, and vitamin D Def.   He was diagnosed with covid 19 on 04/11/2019, was admitted to [facility] for 3 days in January due to disorientation but has done well since discharge other than some episodes of fecal incontinence, mildly loose stools but will start leaking prior to making it to the commode.   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 Last surgery was a lumbar fusion in 01/2015 by Dr Rolena Infante.  He did have a spinal cord stimulator implanted in 2005, but no longer uses it, and follows with [place]. Currently treated by fentanyl patches and Intrathecal pump.   Wife is with him today, concerned about memory; states ongoing for at least 2 years but declining recently; he will forget that they went and saw children just 2 days prior. CT brain from 04/2016 showed: There is no evidence of acute cortical  infarct, intracranial hemorrhage, mass, midline shift, or extra-axial fluid collection. Mild generalized cerebral atrophy is unchanged. Vascular: Minimal calcified atherosclerosis at the skullbase. No hyperdense vessel.  He does have hx of B12 def but not on supplement;  Lab Results  Component Value Date   VITAMINB12 135 (L) 05/12/2017     He is a former smoker (100+ pack year history, quit in 2004) with consequent COPD on imaging though patient denies any symptoms, not on inhaler.   BMI is Body mass index is 29.01 kg/m.  He has a history of Combined Systolic and Diastolic, denies dyspnea on exertion, orthopnea, paroxysmal nocturnal dyspnea and edema. Positive for none. Wt Readings from Last 3 Encounters:  05/09/19 188 lb (85.3 kg)  03/20/19 197 lb (89.4 kg)  12/28/18 200 lb (90.7 kg)   In Jan 2018,  he was hospitalized with Acute/chronic CHF and Heart cath showed non-obstructive CAD with mod/severe systolic CHF. ECHO 09/2017 showing LV EF improved from 35% to 45-50%. He additionally has aortic atherosclerosis noted on imaging (CT 07/25/2016, etc).  Patient is followed by Dr. Gwenlyn Found, has had LBBB and discussed biventricular CRT implantation but was deferred with most recent ECHO 09/2017 showing improved EF   His blood pressure has been controlled at home, today their BP is BP: 110/68 He does not workout. He denies chest pain, shortness of breath, dizziness.   He is on cholesterol medication (atorvastatin 40 mg daily) and denies myalgias. His cholesterol is at goal of LDL <70. The cholesterol last visit was:   Lab Results  Component Value Date   CHOL 149 03/20/2019   HDL 41 03/20/2019   LDLCALC 69 03/20/2019   LDLDIRECT 178.6 09/08/2009   TRIG 326 (H) 03/20/2019  CHOLHDL 3.6 03/20/2019    At last visit admitted to some soda, lots of sweets, is on metformin 2000 total a day, jardiance 10 mg and glimepiride 4 mg BID.  At last visit reported checking blood sugars daily but reported  wife does and he does not know how they are, keeps a log but didn't bring, returns today for log review. Patient was confident he can cut down on soda/sweets and improve this significantly.  He is here with his wife today, she reports he was not checking at all, he will not take medications if she doesn't remind him.  She reports has been checking fasting, recently running 166-230, average 200-215.   denies foot ulcerations, increased appetite, nausea, paresthesia of the feet, polydipsia, polyuria, visual disturbances, vomiting and weight loss. Checking blood sugars daily but wife does and he does not know how they are. Last A1C in the office was:  Lab Results  Component Value Date   HGBA1C 10.0 (H) 03/20/2019   He has CKD II associated with T2DM and htn monitored at this office:  Lab Results  Component Value Date   [facility] 83 03/20/2019    Patient is on Vitamin D supplement.   Lab Results  Component Value Date   VD25OH 61 09/18/2018          Medication Review:  Current Outpatient Medications (Endocrine & Metabolic):  .  glimepiride (AMARYL) 4 MG tablet, TAKE 1 TABLET TWICE DAILY WITH MEALS. DO NOT TAKE IF YOU DO NOT EAT. [name]  JARDIANCE 10 MG TABS tablet, TAKE 1 TABLET BY MOUTH EVERY DAY .  metFORMIN (GLUCOPHAGE-XR) 500 MG 24 hr tablet, Take 2 tablets 2 x /day with Meals for Diabetes .  Dulaglutide (TRULICITY) [id number] [id number] SOPN, Inject 0.75 mg sub Q once weekly for diabetes.  Current Outpatient Medications (Cardiovascular):  .  atorvastatin (LIPITOR) 80 MG tablet, TAKE 0.5 TABLETS (40 MG TOTAL) BY MOUTH DAILY AT 6 PM. (Patient taking differently: Take 80 mg by mouth daily at 6 PM. ) .  ENTRESTO 49-51 MG, TAKE 1 TABLET BY MOUTH TWICE A DAY (Patient taking differently: 0.5 tablets. ) .  spironolactone (ALDACTONE) 25 MG tablet, TAKE 1 TABLET BY MOUTH EVERYDAY AT BEDTIME  Current Outpatient Medications (Respiratory):  .  promethazine-dextromethorphan (PROMETHAZINE-DM) 6.25-15 MG/5ML  syrup, Take 5 mLs by mouth 4 (four) times daily as needed for cough.  Current Outpatient Medications (Analgesics):  .  aspirin 81 MG chewable tablet, Chew 81 mg by mouth at bedtime. .  Tapentadol HCl (NUCYNTA) 100 MG TABS, Take 100 mg by mouth See admin instructions. Take 100 mg by mouth twice daily, may also take 100 mg extra during the day as needed for pain  Current Outpatient Medications (Hematological):  .  folic acid (FOLVITE) 1 MG tablet, Take 1 tablet (1 mg total) by mouth daily. (Patient not taking: Reported on 05/09/2019)  Current Outpatient Medications (Other):  [name]  Cholecalciferol (VITAMIN D-3) 5000 UNITS TABS, Take 5,000 Units by mouth 2 (two) times daily. .  DULoxetine (CYMBALTA) 60 MG capsule, Take 60 mg by mouth daily.  [name]  lidocaine (LIDODERM) 5 %, Place 1 patch onto the skin daily as needed (pain). .  pregabalin (LYRICA) 150 MG capsule, Take 150 mg by mouth 2 (two) times daily. [name]  PRESCRIPTION MEDICATION, by Intrathecal route continuous. Fentanyl 2mg /ml solution . Daily dose 0.8820 mg Compounded at [place] [phone] Pump is refilled monthly .  hyoscyamine (LEVSIN) 0.125 MG tablet, Take 1 tablet (0.125  mg total) by mouth every 4 (four) hours as needed for cramping (diarrhea,nausea).  Allergies: Allergies  Allergen Reactions  . Codeine Itching  . Morphine And Related Other (See Comments)    hallucinations    Current Problems (verified) has COLONIC POLYPS; Hyperlipidemia associated with type 2 diabetes mellitus ([place]); Essential hypertension; Venous (peripheral) insufficiency; COPD (chronic obstructive pulmonary disease) ([place]); Diverticulosis of large intestine; LOW BACK PAIN SYNDROME; DJD (degenerative joint disease); Poorly controlled type 2 diabetes mellitus ([place]); Vitamin D deficiency; Medication management; Failed back syndrome of lumbar spine; Chronic pain associated with significant psychosocial dysfunction; Spinal stenosis, multilevel;  Spinal cord stimulator status; RSD (reflex sympathetic dystrophy); Obesity (BMI 30.0-34.9); Encounter for Medicare annual wellness exam; Cervical arthritis; Non-ischemic cardiomyopathy ([place]); CAD in native artery; LBBB (left bundle branch block); Atherosclerosis of aorta ([place]); Systolic CHF ([place]); Dysphagia; Esophageal stricture; Gastroesophageal reflux disease; Hypertensive heart disease with heart failure ([place]); CKD stage 2 due to type 2 diabetes mellitus ([place]); History of COVID-19; Memory changes; and B12 deficiency on their problem list.  Screening Tests Immunization History  Administered Date(s) Administered  . Influenza Split 04/22/2011, 01/20/2012  . Influenza Whole 01/15/2007, 01/21/2009, 03/08/2010  . Influenza, High Dose Seasonal PF 02/07/2013, 12/10/2015  . Influenza, Quadrivalent, Recombinant, Inj, Pf 01/17/2019  . Influenza-Unspecified 12/10/2013, 01/10/2015, 12/08/2016  . Pneumococcal Conjugate-13 06/09/2015  . Pneumococcal Polysaccharide-23 04/22/2011  . Tdap 04/11/2001, 05/07/2013   Preventative care: Last colonoscopy: 01/24/2013 - due 2024 EGD: 05/2017 Cath 04/2016 Echo 09/2017 CT chest 08/2016   Prior vaccinations: TD or Tdap: 2015  Influenza: 2020  Pneumococcal: 2013 Prevnar13: 2017 Shingles/Zostavax: declines  Names of Other Physician/Practitioners you currently use: 1. [facility] here for primary care 2. Dr. Katy Fitch, eye doctor, last visit 09/18/2018 no retinopathy, abstracted  3. Has dentures , dentist   Patient Care Team: Unk Pinto, MD as PCP - General (Internal Medicine) Sable Feil, MD as Consulting Physician (Gastroenterology) Clent Jacks, MD as Consulting Physician (Ophthalmology) Melina Schools, MD as Consulting Physician (Orthopedic Surgery) Lucia Bitter., MD as Physician Assistant (Pain Medicine) Lennie Odor, MD as Referring Physician (Pain Medicine) [name], MD as Consulting  Physician (Cardiology)  Surgical: He  has a past surgical history that includes Back surgery [id number]); morphine pump (2009); excision of lipoma from right olecranon area (11/2000); microdiscectomy and decompression (05/2002); C3-4 anterior cervical discectomy and fusion with plating at c3-4 (05/2006); spinal cord stimulator implanted; subcut pain pump implanted; Pain pump implantation; Tonsillectomy; Colonoscopy w/ polypectomy; Knee arthroscopy; Total knee arthroplasty (02/29/2012); Lumbar laminectomy/decompression microdiscectomy (N/A, 01/08/2014); Knee arthroscopy (Right, 06/06/2014); Colonoscopy; Cardiac catheterization (N/A, 04/25/2016); Esophagogastroduodenoscopy (egd) with propofol (N/A, 06/06/2017); and Balloon dilation (N/A, 06/06/2017). Family His family history includes Cancer in his father. Social history  He reports that he quit smoking about 16 years ago. His smoking use included cigarettes. He has a 120.00 pack-year smoking history. He has never used smokeless tobacco. He reports that he does not drink alcohol or use drugs.  MEDICARE WELLNESS OBJECTIVES: Physical activity: Current Exercise Habits: The patient does not participate in regular exercise at present, Exercise limited by: orthopedic condition(s) Cardiac risk factors: Cardiac Risk Factors include: advanced age (>38men, >84 women);diabetes mellitus;dyslipidemia;hypertension;obesity (BMI >30kg/m2);male gender;smoking/ tobacco exposure;sedentary lifestyle Depression/mood screen:   Depression screen [facility] 2/9 05/09/2019  Decreased Interest 0  Down, Depressed, Hopeless 0  PHQ - 2 Score 0  Some recent data might be hidden    ADLs:  In your present state of health, do you have any  difficulty performing the following activities: 05/09/2019 03/20/2019  Hearing? N N  Vision? N N  Difficulty concentrating or making decisions? Y N  Comment short term memory getting worse -  Walking or climbing stairs? Y Y  Comment chronic pain, uses  cane/walker can manage with a cane  Dressing or bathing? N N  Doing errands, shopping? Y N  Comment driven by wife -  Conservation officer, nature and eating ? N -  Using the Toilet? N -  In the past six months, have you accidently leaked urine? N -  Do you have problems with loss of bowel control? Y -  Comment wfie assists -  Managing your Medications? Y -  Comment - -  Managing your Finances? Y -  Housekeeping or managing your Housekeeping? Y -  Some recent data might be hidden    Cognitive Testing  Alert? Yes  Normal Appearance?Yes  Oriented to person? Yes  Place? Yes   Time? Yes  Recall of three objects?  1/3  Can perform simple calculations? Yes  Displays appropriate judgment?Yes  Can read the correct time from a watch face?Yes  MMSE - Mini Mental State Exam 05/09/2019  Orientation to time 3  Orientation to Place 5  Registration 3  Attention/ Calculation 5  Recall 1  Language- name 2 objects 2  Language- repeat 1  Language- follow 3 step command 3  Language- read & follow direction 1  Write a sentence 1  Copy design 1  Total score 26    EOL planning: Does Patient Have a Medical Advance Directive?: No Type of Advance Directive: [place] will Does patient want to make changes to medical advance directive?: No - Patient declined Copy of [place] in Chart?: No - copy requested Would patient like information on creating a medical advance directive?: No - Patient declined Yes  Objective:   Today's Vitals   05/09/19 1457  BP: 110/68  Pulse: 82  Temp: (!) 97 F (36.1 C)  SpO2: 97%  Weight: 188 lb (85.3 kg)  PainSc: 7   PainLoc: Back   Body mass index is 29.01 kg/m.  General appearance: alert, no distress, WD/WN, male HEENT: normocephalic, sclerae anicteric, TMs pearly, nares patent, no discharge or erythema, pharynx normal Oral cavity: MMM, no lesions Neck: supple, no lymphadenopathy, no thyromegaly, no masses Heart: RRR,  normal S1, S2, distant heart sounds Lungs: CTA bilaterally, no wheezes, rhonchi, or rales Abdomen: +bs, soft, non tender, non distended, no masses, no hepatomegaly, no splenomegaly Musculoskeletal: nontender, no swelling, no obvious deformity Extremities: no edema, no cyanosis, no clubbing Pulses: 2+ symmetric, upper and lower extremities, normal cap refill Neurological: alert, oriented x 3, CN2-12 intact, strength normal upper extremities and lower extremities, sensation normal throughout,  Poor short term recall, Gait slow with cane Psychiatric: normal affect, behavior normal, pleasant   Medicare Attestation I have personally reviewed: The patient's medical and social history Their use of alcohol, tobacco or illicit drugs Their current medications and supplements The patient's functional ability including ADLs,fall risks, home safety risks, cognitive, and hearing and visual impairment Diet and physical activities Evidence for depression or mood disorders  The patient's weight, height, BMI, and visual acuity have been recorded in the chart.  I have made referrals, counseling, and provided education to the patient based on review of the above and I have provided the patient with a written personalized care plan for preventive services.     Izora Ribas, NP  05/09/2019  

## 2019-05-09 ENCOUNTER — Encounter: Payer: Self-pay | Admitting: Adult Health

## 2019-05-09 ENCOUNTER — Other Ambulatory Visit: Payer: Self-pay

## 2019-05-09 ENCOUNTER — Ambulatory Visit (INDEPENDENT_AMBULATORY_CARE_PROVIDER_SITE_OTHER): Payer: Medicare PPO | Admitting: Adult Health

## 2019-05-09 VITALS — BP 110/68 | HR 82 | Temp 97.0°F | Wt 188.0 lb

## 2019-05-09 DIAGNOSIS — K222 Esophageal obstruction: Secondary | ICD-10-CM

## 2019-05-09 DIAGNOSIS — E1169 Type 2 diabetes mellitus with other specified complication: Secondary | ICD-10-CM

## 2019-05-09 DIAGNOSIS — E1165 Type 2 diabetes mellitus with hyperglycemia: Secondary | ICD-10-CM | POA: Diagnosis not present

## 2019-05-09 DIAGNOSIS — Z79899 Other long term (current) drug therapy: Secondary | ICD-10-CM

## 2019-05-09 DIAGNOSIS — I5022 Chronic systolic (congestive) heart failure: Secondary | ICD-10-CM

## 2019-05-09 DIAGNOSIS — E538 Deficiency of other specified B group vitamins: Secondary | ICD-10-CM | POA: Diagnosis not present

## 2019-05-09 DIAGNOSIS — J42 Unspecified chronic bronchitis: Secondary | ICD-10-CM

## 2019-05-09 DIAGNOSIS — E66811 Obesity, class 1: Secondary | ICD-10-CM

## 2019-05-09 DIAGNOSIS — I447 Left bundle-branch block, unspecified: Secondary | ICD-10-CM

## 2019-05-09 DIAGNOSIS — M4800 Spinal stenosis, site unspecified: Secondary | ICD-10-CM

## 2019-05-09 DIAGNOSIS — N182 Chronic kidney disease, stage 2 (mild): Secondary | ICD-10-CM

## 2019-05-09 DIAGNOSIS — E669 Obesity, unspecified: Secondary | ICD-10-CM

## 2019-05-09 DIAGNOSIS — E559 Vitamin D deficiency, unspecified: Secondary | ICD-10-CM

## 2019-05-09 DIAGNOSIS — I110 Hypertensive heart disease with heart failure: Secondary | ICD-10-CM | POA: Diagnosis not present

## 2019-05-09 DIAGNOSIS — R413 Other amnesia: Secondary | ICD-10-CM

## 2019-05-09 DIAGNOSIS — I428 Other cardiomyopathies: Secondary | ICD-10-CM | POA: Diagnosis not present

## 2019-05-09 DIAGNOSIS — Z8616 Personal history of COVID-19: Secondary | ICD-10-CM

## 2019-05-09 DIAGNOSIS — M1990 Unspecified osteoarthritis, unspecified site: Secondary | ICD-10-CM

## 2019-05-09 DIAGNOSIS — E1122 Type 2 diabetes mellitus with diabetic chronic kidney disease: Secondary | ICD-10-CM

## 2019-05-09 DIAGNOSIS — R159 Full incontinence of feces: Secondary | ICD-10-CM | POA: Insufficient documentation

## 2019-05-09 DIAGNOSIS — K219 Gastro-esophageal reflux disease without esophagitis: Secondary | ICD-10-CM

## 2019-05-09 DIAGNOSIS — G8929 Other chronic pain: Secondary | ICD-10-CM

## 2019-05-09 DIAGNOSIS — R6889 Other general symptoms and signs: Secondary | ICD-10-CM | POA: Diagnosis not present

## 2019-05-09 DIAGNOSIS — Z0001 Encounter for general adult medical examination with abnormal findings: Secondary | ICD-10-CM | POA: Diagnosis not present

## 2019-05-09 DIAGNOSIS — D126 Benign neoplasm of colon, unspecified: Secondary | ICD-10-CM

## 2019-05-09 DIAGNOSIS — I10 Essential (primary) hypertension: Secondary | ICD-10-CM

## 2019-05-09 DIAGNOSIS — Z0000 Encounter for general adult medical examination without abnormal findings: Secondary | ICD-10-CM

## 2019-05-09 DIAGNOSIS — R1310 Dysphagia, unspecified: Secondary | ICD-10-CM

## 2019-05-09 DIAGNOSIS — E785 Hyperlipidemia, unspecified: Secondary | ICD-10-CM

## 2019-05-09 DIAGNOSIS — I700 Atherosclerosis of aorta: Secondary | ICD-10-CM | POA: Diagnosis not present

## 2019-05-09 DIAGNOSIS — K5730 Diverticulosis of large intestine without perforation or abscess without bleeding: Secondary | ICD-10-CM

## 2019-05-09 DIAGNOSIS — I2510 Atherosclerotic heart disease of native coronary artery without angina pectoris: Secondary | ICD-10-CM

## 2019-05-09 DIAGNOSIS — I872 Venous insufficiency (chronic) (peripheral): Secondary | ICD-10-CM

## 2019-05-09 DIAGNOSIS — Z1159 Encounter for screening for other viral diseases: Secondary | ICD-10-CM

## 2019-05-09 DIAGNOSIS — Z9689 Presence of other specified functional implants: Secondary | ICD-10-CM

## 2019-05-09 MED ORDER — HYOSCYAMINE SULFATE 0.125 MG PO TABS: 0.1250 mg | ORAL_TABLET | ORAL | 1 refills | Status: DC | PRN

## 2019-05-09 MED ORDER — TRULICITY 0.75 MG/0.5ML ~~LOC~~ SOAJ: SUBCUTANEOUS | 2 refills | Status: DC

## 2019-05-09 NOTE — Patient Instructions (Addendum)
Mr. Darragh , Thank you for taking time to come for your Medicare Wellness Visit. I appreciate your ongoing commitment to your health goals. Please review the following plan we discussed and let me know if I can assist you in the future.   These are the goals we discussed: Goals    . Fasting Blood Glucose <130    . HEMOGLOBIN A1C < 7    . LDL CALC < 70    . Weight (lb) < 185 lb (83.9 kg)       This is a list of the screening recommended for you and due dates:  Health Maintenance  Topic Date Due  .  Hepatitis C: One time screening is recommended by Center for Disease Control  (CDC) for  adults born from [birth year] through 1965.   12-01-1945  . Complete foot exam   09/17/2019  . Hemoglobin A1C  09/18/2019  . Eye exam for diabetics  09/18/2019  . Urine Protein Check  09/18/2019  . Colon Cancer Screening  01/25/2023  . Tetanus Vaccine  05/08/2023  . Flu Shot  Completed  . Pneumonia vaccines  Completed   Please start trulicity injection one weekly - 0.75 mg once weekly x 4 weeks, then if doing ok call back for higher dose  Cut back on starches and sugars  Goal is fasting sugar consistently <150   A1C <8% to avoid starting insulin      Dulaglutide injection What is this medicine? DULAGLUTIDE (DOO la GLOO tide) is used to improve blood sugar control in adults with type 2 diabetes. This medicine may be used with other oral diabetes medicines. This drug may also reduce the risk of heart attack or stroke if you have type 2 diabetes and risk factors for heart disease. This medicine may be used for other purposes; ask your health care provider or pharmacist if you have questions. COMMON BRAND NAME(S): Trulicity What should I tell my health care provider before I take this medicine? They need to know if you have any of these conditions:  endocrine tumors (MEN 2) or if someone in your family had these tumors  eye disease, vision problems  history of pancreatitis  kidney disease  liver  disease  stomach problems  thyroid cancer or if someone in your family had thyroid cancer  an unusual or allergic reaction to dulaglutide, other medicines, foods, dyes, or preservatives  pregnant or trying to get pregnant  breast-feeding How should I use this medicine? This medicine is for injection under the skin of your upper leg (thigh), stomach area, or upper arm. It is usually given once every week (every 7 days). You will be taught how to prepare and give this medicine. Use exactly as directed. Take your medicine at regular intervals. Do not take it more often than directed. If you use this medicine with insulin, you should inject this medicine and the insulin separately. Do not mix them together. Do not give the injections right next to each other. Change (rotate) injection sites with each injection. It is important that you put your used needles and syringes in a special sharps container. Do not put them in a trash can. If you do not have a sharps container, call your pharmacist or healthcare provider to get one. A special MedGuide will be given to you by the pharmacist with each prescription and refill. Be sure to read this information carefully each time. This drug comes with INSTRUCTIONS FOR USE. Ask your pharmacist for directions on  how to use this drug. Read the information carefully. Talk to your pharmacist or health care provider if you have questions. Talk to your pediatrician regarding the use of this medicine in children. Special care may be needed. Overdosage: If you think you have taken too much of this medicine contact a poison control center or emergency room at once. NOTE: This medicine is only for you. Do not share this medicine with others. What if I miss a dose? If you miss a dose, take it as soon as you can within 3 days after the missed dose. Then take your next dose at your regular weekly time. If it has been longer than 3 days after the missed dose, do not take the  missed dose. Take the next dose at your regular time. Do not take double or extra doses. If you have questions about a missed dose, contact your health care provider for advice. What may interact with this medicine?  other medicines for diabetes Many medications may cause changes in blood sugar, these include:  alcohol containing beverages  antiviral medicines for HIV or AIDS  aspirin and aspirin-like drugs  certain medicines for blood pressure, heart disease, irregular heart beat  chromium  diuretics  male hormones, such as estrogens or progestins, birth control pills  fenofibrate  gemfibrozil  isoniazid  lanreotide  male hormones or anabolic steroids  MAOIs like Carbex, Eldepryl, Marplan, Nardil, and Parnate  medicines for weight loss  medicines for allergies, asthma, cold, or cough  medicines for depression, anxiety, or psychotic disturbances  niacin  nicotine  NSAIDs, medicines for pain and inflammation, like ibuprofen or naproxen  octreotide  pasireotide  pentamidine  phenytoin  probenecid  quinolone antibiotics such as ciprofloxacin, levofloxacin, ofloxacin  some herbal dietary supplements  steroid medicines such as prednisone or cortisone  sulfamethoxazole; trimethoprim  thyroid hormones Some medications can hide the warning symptoms of low blood sugar (hypoglycemia). You may need to monitor your blood sugar more closely if you are taking one of these medications. These include:  beta-blockers, often used for high blood pressure or heart problems (examples include atenolol, metoprolol, propranolol)  clonidine  guanethidine  reserpine This list may not describe all possible interactions. Give your health care provider a list of all the medicines, herbs, non-prescription drugs, or dietary supplements you use. Also tell them if you smoke, drink alcohol, or use illegal drugs. Some items may interact with your medicine. What should I watch  for while using this medicine? Visit your doctor or health care professional for regular checks on your progress. Drink plenty of fluids while taking this medicine. Check with your doctor or health care professional if you get an attack of severe diarrhea, nausea, and vomiting. The loss of too much body fluid can make it dangerous for you to take this medicine. A test called the HbA1C (A1C) will be monitored. This is a simple blood test. It measures your blood sugar control over the last 2 to 3 months. You will receive this test every 3 to 6 months. Learn how to check your blood sugar. Learn the symptoms of low and high blood sugar and how to manage them. Always carry a quick-source of sugar with you in case you have symptoms of low blood sugar. Examples include hard sugar candy or glucose tablets. Make sure others know that you can choke if you eat or drink when you develop serious symptoms of low blood sugar, such as seizures or unconsciousness. They must get medical help  at once. Tell your doctor or health care professional if you have high blood sugar. You might need to change the dose of your medicine. If you are sick or exercising more than usual, you might need to change the dose of your medicine. Do not skip meals. Ask your doctor or health care professional if you should avoid alcohol. Many nonprescription cough and cold products contain sugar or alcohol. These can affect blood sugar. Pens should never be shared. Even if the needle is changed, sharing may result in passing of viruses like hepatitis or HIV. Wear a medical ID bracelet or chain, and carry a card that describes your disease and details of your medicine and dosage times. What side effects may I notice from receiving this medicine? Side effects that you should report to your doctor or health care professional as soon as possible:  allergic reactions like skin rash, itching or hives, swelling of the face, lips, or tongue  breathing  problems  changes in vision  diarrhea that continues or is severe  lump or swelling on the neck  severe nausea  signs and symptoms of infection like fever or chills; cough; sore throat; pain or trouble passing urine  signs and symptoms of low blood sugar such as feeling anxious, confusion, dizziness, increased hunger, unusually weak or tired, sweating, shakiness, cold, irritable, headache, blurred vision, fast heartbeat, loss of consciousness  signs and symptoms of kidney injury like trouble passing urine or change in the amount of urine  trouble swallowing  unusual stomach upset or pain  vomiting Side effects that usually do not require medical attention (report to your doctor or health care professional if they continue or are bothersome):  diarrhea  loss of appetite  nausea  pain, redness, or irritation at site where injected  stomach upset This list may not describe all possible side effects. Call your doctor for medical advice about side effects. You may report side effects to FDA at 1-800-FDA-1088. Where should I keep my medicine? Keep out of the reach of children. Store unopened pens in a refrigerator between 2 and 8 degrees C (36 and 46 degrees F). Do not freeze or use if the medicine has been frozen. Protect from light and excessive heat. Store in the carton until use. Each single-dose pen can be kept at room temperature, not to exceed 30 degrees C (86 degrees F) for a total of 14 days, if needed. Throw away any unused medicine after the expiration date on the label. NOTE: This sheet is a summary. It may not cover all possible information. If you have questions about this medicine, talk to your doctor, pharmacist, or health care provider.  2020 Elsevier/Gold Standard (2018-12-11 09:34:53)     Memory Compensation Strategies  2. Use "WARM" strategy.  W= write it down  A= associate it  R= repeat it  M= make a mental note  2.   You can keep a Armed forces logistics/support/administrative officer.  Use a 3-ring notebook with sections for the following: calendar, important names and phone numbers,  medications, doctors' names/phone numbers, lists/reminders, and a section to journal what you did  each day.   3.    Use a calendar to write appointments down.  4.    Write yourself a schedule for the day.  This can be placed on the calendar or in a separate section of the Memory Notebook.  Keeping a  regular schedule can help memory.  5.    Use medication organizer with sections for  each day or morning/evening pills.  You may need help loading it  6.    Keep a basket, or pegboard by the door.  Place items that you need to take out with you in the basket or on the pegboard.  You may also want to  include a message board for reminders.  7.    Use sticky notes.  Place sticky notes with reminders in a place where the task is performed.  For example: " turn off the  stove" placed by the stove, "lock the door" placed on the door at eye level, " take your medications" on  the bathroom mirror or by the place where you normally take your medications.  8.    Use alarms/timers.  Use while cooking to remind yourself to check on food or as a reminder to take your medicine, or as a  reminder to make a call, or as a reminder to perform another task, etc.

## 2019-05-10 ENCOUNTER — Other Ambulatory Visit: Payer: Self-pay | Admitting: Adult Health

## 2019-05-10 DIAGNOSIS — R413 Other amnesia: Secondary | ICD-10-CM

## 2019-05-12 LAB — FRUCTOSAMINE: Fructosamine: 347 umol/L — ABNORMAL HIGH (ref 205–285)

## 2019-05-12 LAB — ANA: Anti Nuclear Antibody (ANA): NEGATIVE

## 2019-05-12 LAB — VITAMIN B12: Vitamin B-12: 307 pg/mL (ref 200–1100)

## 2019-05-12 LAB — HIV ANTIBODY (ROUTINE TESTING W REFLEX): HIV 1&2 Ab, 4th Generation: NONREACTIVE

## 2019-05-12 LAB — HEPATITIS C ANTIBODY
Hepatitis C Ab: NONREACTIVE
SIGNAL TO CUT-OFF: 0.02 (ref <1.00)

## 2019-05-12 LAB — SEDIMENTATION RATE: Sed Rate: 46 mm/h — ABNORMAL HIGH (ref 0–20)

## 2019-05-12 LAB — RPR: RPR Ser Ql: NONREACTIVE

## 2019-05-25 ENCOUNTER — Other Ambulatory Visit (HOSPITAL_COMMUNITY): Payer: Self-pay | Admitting: Cardiology

## 2019-06-04 ENCOUNTER — Other Ambulatory Visit: Payer: Self-pay | Admitting: Adult Health

## 2019-06-04 DIAGNOSIS — E119 Type 2 diabetes mellitus without complications: Secondary | ICD-10-CM | POA: Diagnosis not present

## 2019-06-04 DIAGNOSIS — H2513 Age-related nuclear cataract, bilateral: Secondary | ICD-10-CM | POA: Diagnosis not present

## 2019-06-04 LAB — HM DIABETES EYE EXAM

## 2019-06-17 ENCOUNTER — Ambulatory Visit: Payer: Medicare PPO | Admitting: Neurology

## 2019-06-17 ENCOUNTER — Encounter: Payer: Self-pay | Admitting: *Deleted

## 2019-06-17 ENCOUNTER — Other Ambulatory Visit: Payer: Self-pay | Admitting: Adult Health

## 2019-06-17 MED ORDER — TRULICITY 1.5 MG/0.5ML ~~LOC~~ SOAJ: 1.5000 mg | SUBCUTANEOUS | 1 refills | Status: DC

## 2019-06-19 ENCOUNTER — Other Ambulatory Visit: Payer: Self-pay | Admitting: Internal Medicine

## 2019-06-20 ENCOUNTER — Ambulatory Visit: Payer: Medicare Other | Admitting: Adult Health

## 2019-06-27 DIAGNOSIS — H35341 Macular cyst, hole, or pseudohole, right eye: Secondary | ICD-10-CM | POA: Diagnosis not present

## 2019-06-27 DIAGNOSIS — H25813 Combined forms of age-related cataract, bilateral: Secondary | ICD-10-CM | POA: Diagnosis not present

## 2019-06-27 DIAGNOSIS — H35371 Puckering of macula, right eye: Secondary | ICD-10-CM | POA: Diagnosis not present

## 2019-06-27 DIAGNOSIS — H43812 Vitreous degeneration, left eye: Secondary | ICD-10-CM | POA: Diagnosis not present

## 2019-07-09 ENCOUNTER — Other Ambulatory Visit: Payer: Self-pay

## 2019-07-09 ENCOUNTER — Encounter: Payer: Self-pay | Admitting: Neurology

## 2019-07-09 ENCOUNTER — Ambulatory Visit: Payer: Medicare PPO | Admitting: Neurology

## 2019-07-09 VITALS — BP 112/72 | HR 93 | Temp 97.5°F | Ht 67.5 in | Wt 189.5 lb

## 2019-07-09 DIAGNOSIS — M5441 Lumbago with sciatica, right side: Secondary | ICD-10-CM

## 2019-07-09 DIAGNOSIS — G8929 Other chronic pain: Secondary | ICD-10-CM | POA: Insufficient documentation

## 2019-07-09 DIAGNOSIS — R413 Other amnesia: Secondary | ICD-10-CM | POA: Diagnosis not present

## 2019-07-09 NOTE — Progress Notes (Signed)
PATIENT: James Parsons DOB: 1946/04/09  Chief Complaint  Patient presents with  . Memory Changes    MMSE 25/30 - 11 animals. He is here with his wife, James Parsons, to have his memory loss evaluated.  [name] PCP    Unk Pinto, MD     HISTORICAL  James Parsons is a 74 year old male, seen in request by his primary care physician Dr. [name], Gwyndolyn Saxon for evaluation of memory loss, he is accompanied by his wife James Parsons at today's visit on July 09, 2019.  I have reviewed and summarized the referring note from the referring physician.  He has past medical history of chronic low back pain, he used to work as a Engineer, manufacturing systems, suffered injury in 2003, required low back surgery, but continues suffered significant low back pain, radiating pain to bilateral lower extremity, especially to right leg, he is under [place] pain management, had a fentanyl pump placement around 2016, is receiving fentanyl through also polypharmacy treatment, Nucynta 100 mg, Cymbalta 60 mg, lidocaine patch, Lyrica 150 mg twice a day, despite polypharmacy, he complains constant 8 out of 10 low back pain.  He spends most of the time sitting in front of the TV, has very irregular sleep pattern, he tends to stay up all night watching TV, sleep throughout the day, very sedentary style.  He also has past medical history of diabetes, is not under good control, A1c in December 2020 was 10, 9 months ago was 9.6.  He also has past medical history of hypertension, hyperlipidemia.  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  I personally reviewed CT head without contrast in January 2018, mild generalized atrophy, especially left perisylvian fissure  region,  Laboratory evaluation in 2020 showed normal or negative hepatitis C antibody, B12, HIV, RPR, ANA, TSH, CMP showed elevated glucose 261, normal creatinine 0.91, CBC was elevated 13.3, with elevated absolutely neutrophil, elevated ESR 46, he was put on sublingual B12 supplement.   REVIEW OF SYSTEMS: Full 14 system review of systems performed and notable only for as above All other review of systems were negative.  ALLERGIES: Allergies  Allergen Reactions  . Codeine Itching  . Morphine And Related Other (See Comments)    hallucinations    HOME MEDICATIONS: Current Outpatient Medications  Medication Sig Dispense Refill  . aspirin 81 MG chewable tablet Chew 81 mg by mouth at bedtime.    [name] atorvastatin (LIPITOR) 80 MG tablet TAKE 0.5 TABLETS (40 MG TOTAL) BY MOUTH DAILY AT 6 PM. (Patient taking differently: Take 80 mg by mouth daily at 6 PM. ) 45 tablet 3  . Cholecalciferol (VITAMIN D-3) 5000 UNITS TABS Take 5,000 Units by mouth 2 (two) times daily.    . Dulaglutide (TRULICITY) 1.5 YP/9.5KD SOPN Inject 1.5 mg into the skin once a week. For diabetes and weight loss. 12 pen 1  . DULoxetine (CYMBALTA) 60 MG capsule Take 60 mg by mouth daily.     [name] ENTRESTO 49-51 MG TAKE 1 TABLET BY MOUTH TWICE A DAY (Patient taking differently: 0.5 tablets. ) 180 tablet 3  . glimepiride (AMARYL) 4 MG tablet TAKE 1 TABLET TWICE DAILY WITH MEALS. DO NOT TAKE IF YOU DO NOT EAT. 180 tablet  1  . hyoscyamine (LEVSIN) 0.125 MG tablet Take 1 to 2 tablets 2 to 3 x   /day ONLY if needed fot Nausea, Bloating, Cramping or Diarrhea 360 tablet 0  . lidocaine (LIDODERM) 5 % Place 1 patch onto the skin daily as needed (pain).    . metFORMIN (GLUCOPHAGE-XR) 500 MG 24 hr tablet Take 2 tablets 2 x /day with Meals for Diabetes 360 tablet 3  . pregabalin (LYRICA) 150 MG capsule Take 150 mg by mouth 2 (two) times daily.    [name] PRESCRIPTION MEDICATION by Intrathecal route continuous. Fentanyl 31m/ml solution . Daily dose 121.1  mg Compounded at [id number] is refilled every six months.    .Marland Kitchenspironolactone (ALDACTONE) 25 MG tablet Take 1 tablet (25 mg total) by mouth daily. Last refill without office visit, please call [phone] tablet 0  . Tapentadol HCl (NUCYNTA) 100 MG TABS Take 100 mg by mouth See admin instructions. Take 100 mg by mouth twice daily, may also take 100 mg extra during the day as needed for pain     No current facility-administered medications for this visit.    PAST MEDICAL HISTORY: Past Medical History:  Diagnosis Date  . Allergy   . Anemia   . Arthritis    right hand  . Borderline hypertension   . CHF ([place]estive heart failure) ([facility]    pt denies  . Clausterphobic   . Clotting disorder (HCC)    bleeds easily-no dx  . COPD ([facility] obstructive pulmonary disease) [place]    denies  . Depression    hx of   . Diabetes mellitus    type 2  . Diverticulosis of colon   . Full dentures   . GERD (gastroesophageal reflux disease)    pt denoes  . Hx of colonic polyps   . Hyperlipidemia   . Hypertension   . Lipoma   . Low back pain   . Memory loss   . Mental disorder   . Obesity   . Pneumonia    hx  . RSD (reflex sympathetic dystrophy)   . Venous insufficiency     PAST SURGICAL HISTORY: Past Surgical History:  Procedure Laterality Date  . BACK SURGERY  2005,2007  . BALLOON DILATION N/A 06/06/2017   Procedure: BALLOON DILATION;  Surgeon: PIrene Shipper MD;  [facility];  Service: Endoscopy;  Laterality: N/A;  . C3-4 anterior cervical discectomy and fusion with plating at c3-4  05/2006   Dr. JArnoldo Morale . CARDIAC CATHETERIZATION N/A 04/25/2016   Procedure: Right/Left Heart Cath and Coronary Angiography;  Surgeon: Peter M JMartinique MD;  [facility];  Service: Cardiovascular;  Laterality: N/A;  . COLONOSCOPY    . COLONOSCOPY W/ POLYPECTOMY    . ESOPHAGOGASTRODUODENOSCOPY (EGD) WITH PROPOFOL N/A 06/06/2017    Procedure: ESOPHAGOGASTRODUODENOSCOPY (EGD) WITH PROPOFOL;  Surgeon: PIrene Shipper MD;  [facility];  Service: Endoscopy;  Laterality: N/A;  . excision of lipoma from right olecranon area  11/2000   Dr. WRise [name] . KNEE ARTHROSCOPY     right knee  . KNEE ARTHROSCOPY Right 06/06/2014   Procedure: ARTHROSCOPY RIGHT KNEE WITH REMOVAL OF FIBROUS BANDS;  Surgeon: FKerin Salen MD;  [facility]  Service: Orthopedics;  Laterality: Right;  . LUMBAR LAMINECTOMY/DECOMPRESSION MICRODISCECTOMY N/A 01/08/2014   Procedure: L4-S1 Decompression with removal and reimplantation of spinal cord stimulator battery ;  Surgeon: DMelina Schools MD;  [facility]  Service: Orthopedics;  Laterality: N/A;  . microdiscectomy and decompression  05/2002   Dr. Tonita Cong  . morphine pump  2009   due to reaction to morphine/changed to fentanyl  . PAIN PUMP IMPLANTATION     with fentanyl  . spinal cord stimulator implanted     for pain per Dr. Maryruth Eve  . subcut pain pump implanted    . TONSILLECTOMY    . TOTAL KNEE ARTHROPLASTY  02/29/2012   Procedure: TOTAL KNEE ARTHROPLASTY;  Surgeon: Kerin Salen, MD;  Location: [facility];  Service: Orthopedics;  Laterality: Right;    FAMILY HISTORY: Family History  Problem Relation Age of Onset  . Lung cancer Father   . Other Mother        "died from old age" at 67  . Colon cancer Neg Hx   . Esophageal cancer Neg Hx   . Stomach cancer Neg Hx   . Rectal cancer Neg Hx     SOCIAL HISTORY: Social History   Socioeconomic History  . Marital status: Married    Spouse name: James Parsons  . Number of children: 2  . Years of education: 2 years of college  . Highest education level: Not on file  Occupational History  . Occupation: retired Management consultant  Tobacco Use  . Smoking status: Former Smoker    Packs/day: 3.00    Years: 40.00    Pack years: 120.00    Types: Cigarettes    Quit date: 05/25/2002    Years since quitting: 17.1  .  Smokeless tobacco: Never Used  Substance and Sexual Activity  . Alcohol use: No  . Drug use: No  . Sexual activity: Never  Other Topics Concern  . Not on file  Social History Narrative   Lives at home with his wife.   Right-handed.   Two cups caffeine per day.   Social Determinants of Health   Financial Resource Strain:   . Difficulty of Paying Living Expenses:   Food Insecurity:   . Worried About Charity fundraiser in the Last Year:   . Arboriculturist in the Last Year:   Transportation Needs:   . Film/video editor (Medical):   [name] Lack of Transportation (Non-Medical):   Physical Activity:   . Days of Exercise per Week:   . Minutes of Exercise per Session:   Stress:   . Feeling of Stress :   Social Connections:   . Frequency of Communication with Friends and Family:   . Frequency of Social Gatherings with Friends and Family:   . Attends Religious Services:   . Active Member of Clubs or Organizations:   . Attends Archivist Meetings:   [name] Marital Status:   Intimate Partner Violence:   . Fear of Current or Ex-Partner:   . Emotionally Abused:   [name] Physically Abused:   . Sexually Abused:      PHYSICAL EXAM   Vitals:   07/09/19 0809  BP: 112/72  Pulse: 93  Temp: (!) 97.5 F (36.4 C)  Weight: 189 lb 8 oz (86 kg)  Height: 5' 7.5" (1.715 m)    Not recorded      Body mass index is 29.24 kg/m.  PHYSICAL EXAMNIATION:  Gen: NAD, conversant, well nourised, well groomed                     Cardiovascular: Regular rate rhythm, no peripheral edema, warm, nontender. Eyes: Conjunctivae clear without exudates or hemorrhage Neck:  Supple, no carotid bruits. Pulmonary: Clear to auscultation bilaterally   NEUROLOGICAL EXAM:  MMSE - Mini Mental State Exam 07/09/2019 05/09/2019  Orientation to time 3 3  Orientation to Place 5 5  Registration 3 3  Attention/ Calculation 5 5  Recall 0 1  Language- name 2 objects 2 2  Language- repeat 1 1  Language- follow  3 step command 3 3  Language- read & follow direction 1 1  Write a sentence 1 1  Copy design 1 1  Total score 25 26  Animal naming 11   CRANIAL NERVES: CN II: Visual fields are full to confrontation. Pupils are round equal and briskly reactive to light. CN III, IV, VI: extraocular movement are normal. No ptosis. CN V: Facial sensation is intact to light touch CN VII: Face is symmetric with normal eye closure  CN VIII: Hearing is normal to causal conversation. CN IX, X: Phonation is normal. CN XI: Head turning and shoulder shrug are intact  MOTOR: There is no pronator drift of out-stretched arms. Muscle bulk and tone are normal. Muscle strength is normal.  REFLEXES: Reflexes are hypoactive and symmetric at the biceps, triceps, knees, and ankles. Plantar responses are flexor.  SENSORY: Length dependent decreased to light touch, pinprick and vibratory sensation at toes  COORDINATION: There is no trunk or limb dysmetria noted.  GAIT/STANCE: He rely on his cane to get up from seated position, antalgic, unsteady  DIAGNOSTIC DATA (LABS, IMAGING, TESTING) - I reviewed patient records, labs, notes, testing and imaging myself where available.   ASSESSMENT AND PLAN  BUSH MURDOCH is a 74 y.o. male   Mild cognitive impairment  Is not a candidate for MRI because of the fentanyl pump replacement  CT head without contrast  Differentiation diagnosis including central nervous system degenerative disorder  His polypharmacy, chronic pain, dysregulation of circadian rhythm likely contributed to,  Have encouraged moderate exercise,  Add on melatonin for circadian rhythm regulation  Return to clinic in 6 months with nurse practitioner Thea Alken, M.D. Ph.D.  [facility] [address] [place], [place] [address] Ph: [phone] Fax: [phone]  CC: Unk Pinto, MD

## 2019-07-10 ENCOUNTER — Telehealth: Payer: Self-pay | Admitting: Neurology

## 2019-07-10 NOTE — Telephone Encounter (Signed)
James Parsons James Parsons: [id number] (exp. 07/10/19 to 08/09/19) order sent to GI. They will reach out to the patient to schedule.

## 2019-07-22 NOTE — Progress Notes (Signed)
FOLLOW UP 3 MONTH  Assessment / Plan   [name] was seen today for follow-up.  Diagnoses and all orders for this visit:  Hypertensive heart disease with heart failure ([place]) Non-ischemic cardiomyopathy ([place]) Chronic systolic congestive heart failure ([place]) Continue current medications: Monitor blood pressure at home; call if consistently over 130/80 Continue DASH diet.   Reminder to go to the ER if any CP, SOB, nausea, dizziness, severe HA, changes vision/speech, left arm numbness and tingling and jaw pain. -     CBC with Differential/Platelet -     COMPLETE METABOLIC PANEL WITH GFR  Hyperlipidemia associated with type 2 diabetes mellitus (HCC) Continue medications: Discussed dietary and exercise modifications Low fat diet -     Lipid panel  Poorly controlled type 2 diabetes mellitus (HCC) Continue medications: Metformin 500mg  two tablets twice a day, glimepride 4mg  BID with meal, trulicity 1.5mg  injection weekly. Discussed general issues about diabetes pathophysiology and management. Education: Reviewed 'ABCs' of diabetes management (respective goals in parentheses):  A1C (<7), blood pressure (<130/80), and cholesterol (LDL <70) Dietary recommendations Encouraged aerobic exercise.  Discussed foot care, check daily Yearly retinal exam Dental exam every 6 months Monitor blood glucose, discussed goal for patient -     Hemoglobin A1c  CKD stage 2 due to type 2 diabetes mellitus (HCC) Increase fluids  Avoid NSAIDS Blood pressure control Monitor sugars  Will continue to monitor  Venous (peripheral) insufficiency Reminded to wear compression hose Elevate extremities, weight daily  Atherosclerosis of aorta (HCC) CAD in native artery Control blood pressure, lipids and glucose Disscused lifestyle modifications, diet & exercise Continue to monitor  Memory changes Following with Neurology CT scheduled  Gastroesophageal reflux disease, unspecified whether esophagitis  present Doing well at this time Continue:  Diet discussed Monitor for triggers Avoid food with high acid content Avoid excessive cafeine Increase water intake  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 Discussed monitoring bowel movements, increase fiber in diet Increase water intake Encourage activity  Esophageal stricture Dysphagia, unspecified type Denies symptoms Wife reports he is doing well asymptomatic at this time Continue to monitor  Vitamin D deficiency Continue supplementation Taking Vitamin D 5,000 IU daily -     VITAMIN D 25 Hydroxy (Vit-D Deficiency, Fractures)  Obesity (BMI 30.0-34.9) Discussed dietary and exercise modifications  Medication management Continued    Over 30 minutes of face to face interview, exam, counseling, chart review, and critical decision making was performed  Future Appointments  Date Time Provider [place]  08/05/2019  1:50 PM GI-315 CT 1 GI-315CT [facility] [facility]  10/28/2019  3:00 PM Unk Pinto, MD [facility] None  01/09/2020  2:45 PM Suzzanne Cloud, NP [facility] None  04/07/2020  3:00 PM Liane Comber, NP [facility] None       Subjective:  James Parsons is a 74 y.o. male who presents for 3 month follow up for HTN, HLD, DMII, GERD, weight and vitamin D Def.   He was diagnosed with covid 19 on 04/11/2019, was admitted to [facility] for 3 days in January due to disorientation but has done well since discharge other than some episodes of fecal incontinence, mildly loose stools but will start leaking prior to making it to the commode.   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 Last surgery was a lumbar fusion  in 01/2015 by Dr Rolena Infante.  He did have a spinal cord stimulator implanted in 2005, but no longer uses it, and follows  with [facility]. Currently treated by fentanyl patches and Intrathecal pump.   He was recently see by Dr Krista Blue for memory loss on 07/09/19.  CT of head was ordered for assessment of central nervous system vs degenerative changes. This is scheduled for 08/05/19, Melatonin was discussed to regular sleep wake cycle.  Patient to follow up in 6 months.  CT brain from 04/2016 showed: There is no evidence of acute cortical infarct, intracranial hemorrhage, mass, midline shift, or extra-axial fluid collection. Mild generalized cerebral atrophy is unchanged. Vascular: Minimal calcified atherosclerosis at the skullbase. No hyperdense vessel.  He is in room alone during appointment.  He traveled with his wife here today and she has an appointment as well.  He denies any health or medication concerns today.  He is altert and oriented, pleasant durring conversation.  Although he is unable to answer any direct questions regarding his health.  His wife manages all of this medications.  When discussing his diet he is unable to recall specific foods that he eats or time that he goes to bed or wakes up. Discussed patients health with wife and she was concerned as he complains of his stomach hurting.  She is unable to link any associating with eating.  He is going to pain management and has a fentanyl pain pump.  He also uses nuycnta 100mg  BID and lidocaine patches.  She reports she thinks he had a bowel movement yesterday.  Discussed concern for constipation, though he denied during appointment, related to current medication list.  Discussed monitoring this and increasing fiber in his diet as well. His wife reports he was to have cataract surgery but his A1c was 10. This postponed surgery until further under control.  She has form she has brought with her today pending lab results. She reports she helps him with the foods he eats as she does the cooking.  She is going through cancer treatment so there are times where  she is not feeling well and has difficulty assisting him with his care.  She assist him with his weekly trulicity injections and denies any difficulties or complications regarding this.  He does have hx of B12 def but not on supplement;  Lab Results  Component Value Date   VITAMINB12 307 05/09/2019     He is a former smoker (100+ pack year history, quit in 2004) with consequent COPD on imaging though patient denies any symptoms, not on inhaler.   BMI is Body mass index is 28.64 kg/m.  He has a history of Combined Systolic and Diastolic, denies dyspnea on exertion, orthopnea, paroxysmal nocturnal dyspnea and edema. Positive for none. Wt Readings from Last 3 Encounters:  07/23/19 185 lb 9.6 oz (84.2 kg)  07/09/19 189 lb 8 oz (86 kg)  05/09/19 188 lb (85.3 kg)   In Jan 2018,  he was hospitalized with Acute/chronic CHF and Heart cath showed non-obstructive CAD with mod/severe systolic CHF. ECHO 09/2017 showing LV EF improved from 35% to 45-50%. He additionally has aortic atherosclerosis noted on imaging (CT 07/25/2016, etc).  Patient is followed by Dr. Gwenlyn Found, has had LBBB and discussed biventricular CRT implantation but was deferred with most recent ECHO 09/2017 showing improved EF   His blood pressure has been controlled at home, today their BP is BP: 110/70 He does not workout. He denies chest pain, shortness of  breath, dizziness.   He is on cholesterol medication (atorvastatin 40 mg daily) and denies myalgias. His cholesterol is at goal of LDL <70. The cholesterol last visit was:   Lab Results  Component Value Date   CHOL 149 03/20/2019   HDL 41 03/20/2019   LDLCALC 69 03/20/2019   LDLDIRECT 178.6 09/08/2009   TRIG 326 (H) 03/20/2019   CHOLHDL 3.6 03/20/2019    At last visit admitted to some soda, lots of sweets, is on metformin 2000 total a day, jardiance 10 mg and glimepiride 4 mg BID.  At last visit reported checking blood sugars daily but reported wife does and he does not know  how they are, keeps a log but didn't bring, returns today for log review. Patient was confident he can cut down on soda/sweets and improve this significantly.  He is here with his wife today, she reports he was not checking at all, he will not take medications if she doesn't remind him.  She reports has been checking fasting, recently running 166-230, average 200-215.   denies foot ulcerations, increased appetite, nausea, paresthesia of the feet, polydipsia, polyuria, visual disturbances, vomiting and weight loss. Checking blood sugars daily but wife does and he does not know how they are. Last A1C in the office was:  Lab Results  Component Value Date   HGBA1C 10.0 (H) 03/20/2019   He has CKD II associated with T2DM and htn monitored at this office:  Lab Results  Component Value Date   [facility] 83 03/20/2019    Patient is on Vitamin D supplement.   Lab Results  Component Value Date   VD25OH 61 09/18/2018          Medication Review:  Current Outpatient Medications (Endocrine & Metabolic):  [name]  Dulaglutide (TRULICITY) 1.5 0000000 SOPN, Inject 1.5 mg into the skin once a week. For diabetes and weight loss. [name]  glimepiride (AMARYL) 4 MG tablet, TAKE 1 TABLET TWICE DAILY WITH MEALS. DO NOT TAKE IF YOU DO NOT EAT. .  metFORMIN (GLUCOPHAGE-XR) 500 MG 24 hr tablet, Take 2 tablets 2 x /day with Meals for Diabetes  Current Outpatient Medications (Cardiovascular):  .  atorvastatin (LIPITOR) 80 MG tablet, TAKE 0.5 TABLETS (40 MG TOTAL) BY MOUTH DAILY AT 6 PM. (Patient taking differently: Take 80 mg by mouth daily at 6 PM. ) .  ENTRESTO 49-51 MG, TAKE 1 TABLET BY MOUTH TWICE A DAY (Patient taking differently: 0.5 tablets. ) .  spironolactone (ALDACTONE) 25 MG tablet, Take 1 tablet (25 mg total) by mouth daily. Last refill without office visit, please call [phone]   Current Outpatient Medications (Analgesics):  .  aspirin 81 MG chewable tablet, Chew 81 mg by mouth at bedtime. .   Tapentadol HCl (NUCYNTA) 100 MG TABS, Take 100 mg by mouth See admin instructions. Take 100 mg by mouth twice daily, may also take 100 mg extra during the day as needed for pain   Current Outpatient Medications (Other):  [name]  Cholecalciferol (VITAMIN D-3) 5000 UNITS TABS, Take 5,000 Units by mouth 2 (two) times daily. .  DULoxetine (CYMBALTA) 60 MG capsule, Take 60 mg by mouth daily.  .  hyoscyamine (LEVSIN) 0.125 MG tablet, Take 1 to 2 tablets 2 to 3 x   /day ONLY if needed fot Nausea, Bloating, Cramping or Diarrhea .  lidocaine (LIDODERM) 5 %, Place 1 patch onto the skin daily as needed (pain). .  pregabalin (LYRICA) 150 MG capsule, Take 150 mg by mouth 2 (two) times  daily. [name]  PRESCRIPTION MEDICATION, by Intrathecal route continuous. Fentanyl 2mg /ml solution . Daily dose 121.1 mg Compounded at [facility] [phone] Pump is refilled every six months.  Allergies: Allergies  Allergen Reactions  . Codeine Itching  . Morphine And Related Other (See Comments)    hallucinations    Current Problems (verified) has COLONIC POLYPS; Hyperlipidemia associated with type 2 diabetes mellitus ([place]); Essential hypertension; Venous (peripheral) insufficiency; COPD (chronic obstructive pulmonary disease) ([place]); Diverticulosis of large intestine; LOW BACK PAIN SYNDROME; DJD (degenerative joint disease); Poorly controlled type 2 diabetes mellitus ([place]); Vitamin D deficiency; Medication management; Failed back syndrome of lumbar spine; Chronic pain associated with significant psychosocial dysfunction; Spinal stenosis, multilevel; Spinal cord stimulator status; RSD (reflex sympathetic dystrophy); Obesity (BMI 30.0-34.9); Encounter for Medicare annual wellness exam; Cervical arthritis; Non-ischemic cardiomyopathy ([place]); CAD in native artery; LBBB (left bundle branch block); Atherosclerosis of aorta ([place]); Systolic CHF ([place]); Dysphagia; Esophageal stricture; Gastroesophageal reflux disease;  Hypertensive heart disease with heart failure ([place]); CKD stage 2 due to type 2 diabetes mellitus ([place]); History of COVID-19; Memory changes; B12 deficiency; Incontinence of feces; Memory loss; and Chronic midline low back pain with right-sided sciatica on their problem list.  Screening Tests Immunization History  Administered Date(s) Administered  . Influenza Split 04/22/2011, 01/20/2012  . Influenza Whole 01/15/2007, 01/21/2009, 03/08/2010  . Influenza, High Dose Seasonal PF 02/07/2013, 12/10/2015  . Influenza, Quadrivalent, Recombinant, Inj, Pf 01/17/2019  . Influenza-Unspecified 12/10/2013, 01/10/2015, 12/08/2016  . Pneumococcal Conjugate-13 06/09/2015  . Pneumococcal Polysaccharide-23 04/22/2011  . Tdap 04/11/2001, 05/07/2013   Preventative care: Last colonoscopy: 01/24/2013 - due 2024? EGD: 05/2017 Cath 04/2016 Echo 09/2017 CT chest 08/2016   Prior vaccinations: TD or Tdap: 2015  Influenza: 2020  Pneumococcal: 2013 Prevnar13: 2017 Shingles/Zostavax: declines COVID-19, SARS2 - 07/18/19   Names of Other Physician/Practitioners you currently use: 1. [facility] here for primary care 2. Dr. Katy Fitch, eye doctor, last visit 09/18/2018 no retinopathy, abstracted  3. Has dentures , dentist    Patient Care Team: Unk Pinto, MD as PCP - General (Internal Medicine) Sable Feil, MD as Consulting Physician (Gastroenterology) Clent Jacks, MD as Consulting Physician (Ophthalmology) Melina Schools, MD as Consulting Physician (Orthopedic Surgery) Lucia Bitter., MD as Physician Assistant (Pain Medicine) Lennie Odor, MD as Referring Physician (Pain Medicine) [name], MD as Consulting Physician (Cardiology)  Surgical: He  has a past surgical history that includes Back surgery [id number]); morphine pump (2009); excision of lipoma from right olecranon area (11/2000); microdiscectomy and decompression (05/2002); C3-4  anterior cervical discectomy and fusion with plating at c3-4 (05/2006); spinal cord stimulator implanted; subcut pain pump implanted; Pain pump implantation; Tonsillectomy; Colonoscopy w/ polypectomy; Knee arthroscopy; Total knee arthroplasty (02/29/2012); Lumbar laminectomy/decompression microdiscectomy (N/A, 01/08/2014); Knee arthroscopy (Right, 06/06/2014); Colonoscopy; Cardiac catheterization (N/A, 04/25/2016); Esophagogastroduodenoscopy (egd) with propofol (N/A, 06/06/2017); and Balloon dilation (N/A, 06/06/2017). Family His family history includes Lung cancer in his father; Other in his mother. Social history  He reports that he quit smoking about 17 years ago. His smoking use included cigarettes. He has a 120.00 pack-year smoking history. He has never used smokeless tobacco. He reports that he does not drink alcohol or use drugs.     Objective:   Today's Vitals   07/23/19 1350  BP: 110/70  Pulse: 93  Temp: (!) 97.5 F (36.4 C)  SpO2: 98%  Weight: 185 lb 9.6 oz (84.2 kg)  Height: 5' 7.5" (1.715 m)   Body mass  index is 28.64 kg/m.  General appearance: alert, no distress, WD/WN, male HEENT: normocephalic, sclerae anicteric, TMs pearly, nares patent, no discharge or erythema, pharynx normal Oral cavity: MMM, no lesions Neck: supple, no lymphadenopathy, no thyromegaly, no masses Heart: RRR, normal S1, S2, distant heart sounds Lungs: CTA bilaterally, no wheezes, rhonchi, or rales Abdomen: +bs, soft, non tender, non distended, no masses, no hepatomegaly, no splenomegaly Musculoskeletal: nontender, no swelling, no obvious deformity Extremities: no edema, no cyanosis, no clubbing Pulses: 2+ symmetric, upper and lower extremities, normal cap refill Neurological: alert, oriented x 3, CN2-12 intact, strength normal upper extremities and lower extremities, sensation normal throughout,  Poor short term recall, Gait slow with cane Psychiatric: normal affect, behavior normal, pleasant      Garnet Sierras, NP   07/23/2019

## 2019-07-23 ENCOUNTER — Other Ambulatory Visit: Payer: Self-pay

## 2019-07-23 ENCOUNTER — Ambulatory Visit: Payer: Medicare PPO | Admitting: Adult Health Nurse Practitioner

## 2019-07-23 ENCOUNTER — Encounter: Payer: Self-pay | Admitting: Adult Health Nurse Practitioner

## 2019-07-23 VITALS — BP 110/70 | HR 93 | Temp 97.5°F | Ht 67.5 in | Wt 185.6 lb

## 2019-07-23 DIAGNOSIS — K222 Esophageal obstruction: Secondary | ICD-10-CM

## 2019-07-23 DIAGNOSIS — E1122 Type 2 diabetes mellitus with diabetic chronic kidney disease: Secondary | ICD-10-CM

## 2019-07-23 DIAGNOSIS — I872 Venous insufficiency (chronic) (peripheral): Secondary | ICD-10-CM

## 2019-07-23 DIAGNOSIS — I110 Hypertensive heart disease with heart failure: Secondary | ICD-10-CM

## 2019-07-23 DIAGNOSIS — E66811 Obesity, class 1: Secondary | ICD-10-CM

## 2019-07-23 DIAGNOSIS — R1310 Dysphagia, unspecified: Secondary | ICD-10-CM

## 2019-07-23 DIAGNOSIS — E1169 Type 2 diabetes mellitus with other specified complication: Secondary | ICD-10-CM

## 2019-07-23 DIAGNOSIS — I428 Other cardiomyopathies: Secondary | ICD-10-CM | POA: Diagnosis not present

## 2019-07-23 DIAGNOSIS — G8929 Other chronic pain: Secondary | ICD-10-CM

## 2019-07-23 DIAGNOSIS — E559 Vitamin D deficiency, unspecified: Secondary | ICD-10-CM

## 2019-07-23 DIAGNOSIS — E785 Hyperlipidemia, unspecified: Secondary | ICD-10-CM

## 2019-07-23 DIAGNOSIS — E1165 Type 2 diabetes mellitus with hyperglycemia: Secondary | ICD-10-CM

## 2019-07-23 DIAGNOSIS — Z79899 Other long term (current) drug therapy: Secondary | ICD-10-CM

## 2019-07-23 DIAGNOSIS — I2510 Atherosclerotic heart disease of native coronary artery without angina pectoris: Secondary | ICD-10-CM

## 2019-07-23 DIAGNOSIS — R413 Other amnesia: Secondary | ICD-10-CM | POA: Diagnosis not present

## 2019-07-23 DIAGNOSIS — I700 Atherosclerosis of aorta: Secondary | ICD-10-CM | POA: Diagnosis not present

## 2019-07-23 DIAGNOSIS — I5022 Chronic systolic (congestive) heart failure: Secondary | ICD-10-CM | POA: Diagnosis not present

## 2019-07-23 DIAGNOSIS — M545 Low back pain: Secondary | ICD-10-CM

## 2019-07-23 DIAGNOSIS — K219 Gastro-esophageal reflux disease without esophagitis: Secondary | ICD-10-CM

## 2019-07-23 DIAGNOSIS — E669 Obesity, unspecified: Secondary | ICD-10-CM

## 2019-07-23 DIAGNOSIS — N182 Chronic kidney disease, stage 2 (mild): Secondary | ICD-10-CM

## 2019-07-24 LAB — CBC WITH DIFFERENTIAL/PLATELET
Absolute Monocytes: 376 cells/uL (ref 200–950)
Basophils Absolute: 38 cells/uL (ref 0–200)
Basophils Relative: 0.4 %
Eosinophils Absolute: 357 cells/uL (ref 15–500)
Eosinophils Relative: 3.8 %
HCT: 40 % (ref 38.5–50.0)
Hemoglobin: 13.2 g/dL (ref 13.2–17.1)
Lymphs Abs: 2369 cells/uL (ref 850–3900)
MCH: 28.2 pg (ref 27.0–33.0)
MCHC: 33 g/dL (ref 32.0–36.0)
MCV: 85.5 fL (ref 80.0–100.0)
MPV: 10.9 fL (ref 7.5–12.5)
Monocytes Relative: 4 %
Neutro Abs: 6260 cells/uL (ref 1500–7800)
Neutrophils Relative %: 66.6 %
Platelets: 358 10*3/uL (ref 140–400)
RBC: 4.68 10*6/uL (ref 4.20–5.80)
RDW: 12.6 % (ref 11.0–15.0)
Total Lymphocyte: 25.2 %
WBC: 9.4 10*3/uL (ref 3.8–10.8)

## 2019-07-24 LAB — COMPLETE METABOLIC PANEL WITH GFR
AG Ratio: 1.5 (calc) (ref 1.0–2.5)
ALT: 9 U/L (ref 9–46)
AST: 11 U/L (ref 10–35)
Albumin: 4.1 g/dL (ref 3.6–5.1)
Alkaline phosphatase (APISO): 92 U/L (ref 35–144)
BUN: 11 mg/dL (ref 7–25)
CO2: 28 mmol/L (ref 20–32)
Calcium: 9.9 mg/dL (ref 8.6–10.3)
Chloride: 101 mmol/L (ref 98–110)
Creat: 0.78 mg/dL (ref 0.70–1.18)
GFR, Est African American: 104 mL/min/{1.73_m2} (ref >=60)
GFR, Est Non African American: 90 mL/min/{1.73_m2} (ref >=60)
Globulin: 2.8 g/dL (calc) (ref 1.9–3.7)
Glucose, Bld: 129 mg/dL — ABNORMAL HIGH (ref 65–99)
Potassium: 5 mmol/L (ref 3.5–5.3)
Sodium: 139 mmol/L (ref 135–146)
Total Bilirubin: 0.5 mg/dL (ref 0.2–1.2)
Total Protein: 6.9 g/dL (ref 6.1–8.1)

## 2019-07-24 LAB — VITAMIN D 25 HYDROXY (VIT D DEFICIENCY, FRACTURES): Vit D, 25-Hydroxy: 69 ng/mL (ref 30–100)

## 2019-07-24 LAB — LIPID PANEL
Cholesterol: 124 mg/dL (ref <200)
HDL: 32 mg/dL — ABNORMAL LOW (ref >=40)
LDL Cholesterol (Calc): 74 mg/dL (calc)
Non-HDL Cholesterol (Calc): 92 mg/dL (calc) (ref <130)
Total CHOL/HDL Ratio: 3.9 (calc) (ref <5.0)
Triglycerides: 98 mg/dL (ref <150)

## 2019-07-24 LAB — HEMOGLOBIN A1C
Hgb A1c MFr Bld: 7.3 % of total Hgb — ABNORMAL HIGH (ref <5.7)
Mean Plasma Glucose: 163 (calc)
eAG (mmol/L): 9 (calc)

## 2019-08-05 ENCOUNTER — Ambulatory Visit
Admission: RE | Admit: 2019-08-05 | Discharge: 2019-08-05 | Disposition: A | Discharge status: Home / Self Care | Payer: Medicare PPO | Source: Ambulatory Visit | Attending: Neurology | Admitting: Neurology

## 2019-08-05 ENCOUNTER — Other Ambulatory Visit: Payer: Self-pay

## 2019-08-05 ENCOUNTER — Telehealth: Payer: Self-pay | Admitting: Neurology

## 2019-08-05 DIAGNOSIS — R413 Other amnesia: Secondary | ICD-10-CM | POA: Diagnosis not present

## 2019-08-05 NOTE — Telephone Encounter (Signed)
  IMPRESSION: This CT scan of the head without contrast shows the following: 1.   Mild generalized cortical atrophy that is most pronounced in the perisylvian region on the left.  This is stable compared to the CT scan from 04/29/2016. 2.   Minimal chronic microvascular ischemic changes. 3.   There are no acute findings.  Please call patient, CT of the brain showed generalized change, more on the left side, stable compared to previous scan in 2018, mild small vessel disease, no acute abnormalities.

## 2019-08-06 NOTE — Telephone Encounter (Signed)
Left message on both home and mobile numbers asking for a return call.

## 2019-08-06 NOTE — Telephone Encounter (Signed)
I spoke to the patient's wife and provided her with the CT head results. She verbalized understanding of the findings.

## 2019-09-03 DIAGNOSIS — H04123 Dry eye syndrome of bilateral lacrimal glands: Secondary | ICD-10-CM | POA: Diagnosis not present

## 2019-09-03 DIAGNOSIS — H25813 Combined forms of age-related cataract, bilateral: Secondary | ICD-10-CM | POA: Diagnosis not present

## 2019-09-11 DIAGNOSIS — I130 Hypertensive heart and chronic kidney disease with heart failure and stage 1 through stage 4 chronic kidney disease, or unspecified chronic kidney disease: Secondary | ICD-10-CM | POA: Diagnosis not present

## 2019-09-11 DIAGNOSIS — Z87891 Personal history of nicotine dependence: Secondary | ICD-10-CM | POA: Diagnosis not present

## 2019-09-11 DIAGNOSIS — E1122 Type 2 diabetes mellitus with diabetic chronic kidney disease: Secondary | ICD-10-CM | POA: Diagnosis not present

## 2019-09-11 DIAGNOSIS — E1136 Type 2 diabetes mellitus with diabetic cataract: Secondary | ICD-10-CM | POA: Diagnosis not present

## 2019-09-11 DIAGNOSIS — Z7984 Long term (current) use of oral hypoglycemic drugs: Secondary | ICD-10-CM | POA: Diagnosis not present

## 2019-09-11 DIAGNOSIS — E119 Type 2 diabetes mellitus without complications: Secondary | ICD-10-CM | POA: Diagnosis not present

## 2019-09-11 DIAGNOSIS — Z79899 Other long term (current) drug therapy: Secondary | ICD-10-CM | POA: Diagnosis not present

## 2019-09-11 DIAGNOSIS — N182 Chronic kidney disease, stage 2 (mild): Secondary | ICD-10-CM | POA: Diagnosis not present

## 2019-09-11 DIAGNOSIS — H25812 Combined forms of age-related cataract, left eye: Secondary | ICD-10-CM | POA: Diagnosis not present

## 2019-09-11 DIAGNOSIS — J449 Chronic obstructive pulmonary disease, unspecified: Secondary | ICD-10-CM | POA: Diagnosis not present

## 2019-09-11 DIAGNOSIS — H269 Unspecified cataract: Secondary | ICD-10-CM | POA: Diagnosis not present

## 2019-09-11 DIAGNOSIS — I5022 Chronic systolic (congestive) heart failure: Secondary | ICD-10-CM | POA: Diagnosis not present

## 2019-09-18 DIAGNOSIS — E119 Type 2 diabetes mellitus without complications: Secondary | ICD-10-CM | POA: Diagnosis not present

## 2019-09-18 DIAGNOSIS — Z79899 Other long term (current) drug therapy: Secondary | ICD-10-CM | POA: Diagnosis not present

## 2019-09-18 DIAGNOSIS — N182 Chronic kidney disease, stage 2 (mild): Secondary | ICD-10-CM | POA: Diagnosis not present

## 2019-09-18 DIAGNOSIS — E1136 Type 2 diabetes mellitus with diabetic cataract: Secondary | ICD-10-CM | POA: Diagnosis not present

## 2019-09-18 DIAGNOSIS — I509 Heart failure, unspecified: Secondary | ICD-10-CM | POA: Diagnosis not present

## 2019-09-18 DIAGNOSIS — H269 Unspecified cataract: Secondary | ICD-10-CM | POA: Diagnosis not present

## 2019-09-18 DIAGNOSIS — Z7984 Long term (current) use of oral hypoglycemic drugs: Secondary | ICD-10-CM | POA: Diagnosis not present

## 2019-09-18 DIAGNOSIS — I130 Hypertensive heart and chronic kidney disease with heart failure and stage 1 through stage 4 chronic kidney disease, or unspecified chronic kidney disease: Secondary | ICD-10-CM | POA: Diagnosis not present

## 2019-09-18 DIAGNOSIS — E1122 Type 2 diabetes mellitus with diabetic chronic kidney disease: Secondary | ICD-10-CM | POA: Diagnosis not present

## 2019-09-18 DIAGNOSIS — Z7982 Long term (current) use of aspirin: Secondary | ICD-10-CM | POA: Diagnosis not present

## 2019-09-18 DIAGNOSIS — I5022 Chronic systolic (congestive) heart failure: Secondary | ICD-10-CM | POA: Diagnosis not present

## 2019-09-18 DIAGNOSIS — H25811 Combined forms of age-related cataract, right eye: Secondary | ICD-10-CM | POA: Diagnosis not present

## 2019-10-07 ENCOUNTER — Encounter: Payer: Medicare Other | Admitting: Internal Medicine

## 2019-10-25 NOTE — Progress Notes (Deleted)
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   Reminder to go to the ER if any CP, SOB, nausea, dizziness, severe HA, changes vision/speech, left arm numbness and tingling and jaw pain.  CAD in native artery Control blood pressure, cholesterol, glucose, increase exercise.  Followed by cardiology  Atherosclerosis of aorta ([place]) Per numerous CTs, 07/2016, etc Control blood pressure, cholesterol, glucose, increase exercise.  Followed by cardiology  Chronic obstructive pulmonary disease, unspecified COPD type ([place]) Avoid triggers, continue inhaler as needed, r/t smoking hx; monitor   Gastroesophageal reflux disease, esophagitis presence not specified Avoid triggers, continue PPI, followed by GI  Esophageal stricture Followed by GI, continue PPI  Dysphagia, unspecified type Reminded to take small bites, avoid eating while distracted Followed by GI   Benign neoplasm of colon, unspecified part of colon Up to date on colonoscopies, followed by GI  Controlled type 2 diabetes mellitus with diabetic nephropathy, without long-term current use of insulin [facility]) Education: Reviewed 'ABCs' of diabetes management (respective goals in parentheses):  A1C (<7), blood pressure (<130/80), and cholesterol (LDL <70) Reminded Eye Exam yearly and Dental Exam every 6 months. Dietary/physical activity recommendations  reviewed Continue metformin, glimepiride, recently on trulicity with improved control *** Fasting glucose goal <150; wife will assist  RSD (reflex sympathetic dystrophy) Followed by ortho/pain management  Osteoarthritis, unspecified osteoarthritis type, unspecified site Followed by ortho/pain management  Vitamin D deficiency Continue to recommend supplementation for goal of 60-100  Overweight *** Long discussion about weight loss, diet, and exercise Recommended diet heavy in fruits and veggies and low in animal meats, cheeses, and dairy products, appropriate calorie intake Discussed appropriate weight for height  Follow up at next visit  Mixed hyperlipidemia associated with T2DM ([place]) Continue medications: atorvastatin LDL goal <70 Continue low cholesterol diet and exercise.  Check lipid panel q63m  CKD II associated with T2DM (HCC) Increase fluids, avoid NSAIDS, monitor sugars, will monitor  Medication management CBC, CMP/GFR, magnesium  Chronic bilateral low back pain without sciatica Followed by ortho/pain management  Diverticulosis of large intestine without hemorrhage Up to date on colonoscopies, increase fiber  Chronic pain associated with significant psychosocial dysfunction Followed by ortho/pain management  Memory changes *** Per wife ongoing 2-3 years, notable progression; MMSE 26/30 today  Check labs - Sed, ANA, B12, RPR; has recent normal CBC, TSH Initiate B12 sublingual supplement  CT head 2018 showed Mild generalized cerebral atrophy Per wife preference will proceed with neuro referral and defer MRI imaging Very high risk for vascular etiology; 100+ pack/year smoker, poorly controlled diabetes, cardiac hx  No orders of the defined types were placed in this encounter.    Over 30 minutes of exam, counseling, chart review, and critical decision making was performed  Future Appointments  Date Time Provider Anita  10/28/2019  3:00 PM Unk Pinto, MD [facility] None  01/09/2020  2:45 PM Suzzanne Cloud, NP [facility] None  04/07/2020  3:00 PM Liane Comber, NP [facility] None  10/28/2020 11:00 AM Unk Pinto, MD [facility] None  Plan:   During the course of the visit the patient was educated and counseled about appropriate screening and preventive services including:    Pneumococcal vaccine   Influenza vaccine  Prevnar 13  Td vaccine  Screening electrocardiogram  Colorectal cancer screening  Diabetes screening  Glaucoma screening  Nutrition counseling    Subjective:  James Parsons is a 74 y.o. male who presents for Annual Wellness Visit and follow up for HTN, hyperlipidemia, diabetes, and vitamin D Def.   He was diagnosed with covid 19 on 04/11/2019, was admitted to [facility] for 3 days in January due to disorientation but has done well since discharge other than some episodes of fecal incontinence, mildly loose stools but will start leaking prior to making it to the commode. This year reported memory changes *** ? covid related ***, had unremarkable lab workup, referred to Dr. Krista Blue, had CT 07/2019 showing microvascular changes, stable from 2018 ***. He is a former smoker (100+ pack year history, quit in 2004) with consequent COPD on imaging though patient denies any symptoms, not on inhaler.   *** last CT chest 2018  Resolution of LEFT upper lobe pulmonary nodule, stable sub solid RIGHT pulmonary nodules measuring to 4 mm. No follow-up needed if patient is low-risk (and has no known or suspected primary neoplasm). Non-contrast chest CT can be considered in 12 months if patient is high-risk.   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 Last surgery was a lumbar fusion in 01/2015 by Dr Rolena Infante.  He did have a spinal cord stimulator implanted in 2005, but no longer uses it, and follows with [facility]. Currently treated by fentanyl patches and Intrathecal pump.     BMI is There is no height or weight on file to calculate BMI.  He has a history of Combined Systolic and Diastolic, denies dyspnea on exertion, orthopnea, paroxysmal nocturnal dyspnea and edema. Positive for none. Wt Readings from Last 3 Encounters:  07/23/19 185 lb 9.6 oz (84.2 kg)  07/09/19 189 lb 8 oz (86 kg)  05/09/19 188 lb (85.3 kg)   In Jan 2018,  he was hospitalized with Acute/chronic CHF and Heart cath showed non-obstructive CAD with mod/severe systolic CHF. ECHO 09/2017 showing LV EF improved from 35% to 45-50%. He additionally has aortic atherosclerosis noted on imaging (CT 07/25/2016, etc).  Patient is followed by Dr. Gwenlyn Found, has had LBBB and discussed biventricular CRT implantation but was deferred with most recent ECHO 09/2017 showing improved EF   His blood pressure has been controlled at home, today their BP is   He does not workout. He denies chest pain, shortness of breath, dizziness.   He is on cholesterol medication (atorvastatin 40 mg daily) and denies myalgias. His cholesterol is not at goal of LDL <70. The cholesterol last visit was:   Lab Results  Component Value Date   CHOL 124 07/23/2019   HDL 32 (L) 07/23/2019   LDLCALC 74 07/23/2019   LDLDIRECT 178.6 09/08/2009   TRIG 98 07/23/2019   CHOLHDL 3.9 07/23/2019    He has T2DM with CKD and vascular complications, has had poor control in the past (A1C 13.2 in 12/2017, etc) but improved at last check *** is on metformin 2000 total a day, trulicity *** and glimepiride 4 mg BID.   *** At last visit admitted to some soda, lots of sweets,  At last  visit reported checking blood sugars daily but reported wife does and he does not know how they are, keeps a log but didn't bring, returns today for log review. Patient was confident he can cut down on soda/sweets and improve this significantly.  He is here with his wife today, she reports he was not  checking at all, he will not take medications if she doesn't remind him.  She reports has been checking fasting, recently running 166-230, average 200-215.   denies foot ulcerations, increased appetite, nausea, paresthesia of the feet, polydipsia, polyuria, visual disturbances, vomiting and weight loss.  Last A1C in the office was:  Lab Results  Component Value Date   HGBA1C 7.3 (H) 07/23/2019   He has CKD II associated with T2DM and htn monitored at this office:  Lab Results  Component Value Date   GFRNONAA 90 07/23/2019    Patient is on Vitamin D supplement.   Lab Results  Component Value Date   VD25OH 69 07/23/2019      Last 3 PSA:  Lab Results  Component Value Date   PSA 0.4 09/18/2018   PSA 0.3 05/11/2017   PSA 0.3 03/23/2016   He has memory decline with B12 def, was recommended sublingual *** Lab Results  Component Value Date   VITAMINB12 307 05/09/2019      Medication Review:  Current Outpatient Medications (Endocrine & Metabolic):  [name]  Dulaglutide (TRULICITY) 1.5 RK/2.7CW SOPN, Inject 1.5 mg into the skin once a week. For diabetes and weight loss. [name]  glimepiride (AMARYL) 4 MG tablet, TAKE 1 TABLET TWICE DAILY WITH MEALS. DO NOT TAKE IF YOU DO NOT EAT. .  metFORMIN (GLUCOPHAGE-XR) 500 MG 24 hr tablet, Take 2 tablets 2 x /day with Meals for Diabetes  Current Outpatient Medications (Cardiovascular):  .  atorvastatin (LIPITOR) 80 MG tablet, TAKE 0.5 TABLETS (40 MG TOTAL) BY MOUTH DAILY AT 6 PM. (Patient taking differently: Take 80 mg by mouth daily at 6 PM. ) .  ENTRESTO 49-51 MG, TAKE 1 TABLET BY MOUTH TWICE A DAY (Patient taking differently: 0.5 tablets. ) .  spironolactone (ALDACTONE) 25 MG tablet, Take 1 tablet (25 mg total) by mouth daily. Last refill without office visit, please call [phone]   Current Outpatient Medications (Analgesics):  .  aspirin 81 MG chewable tablet, Chew 81 mg by mouth at bedtime. .  Tapentadol HCl (NUCYNTA) 100 MG TABS, Take 100  mg by mouth See admin instructions. Take 100 mg by mouth twice daily, may also take 100 mg extra during the day as needed for pain   Current Outpatient Medications (Other):  [name]  Cholecalciferol (VITAMIN D-3) 5000 UNITS TABS, Take 5,000 Units by mouth 2 (two) times daily. .  DULoxetine (CYMBALTA) 60 MG capsule, Take 60 mg by mouth daily.  .  hyoscyamine (LEVSIN) 0.125 MG tablet, Take 1 to 2 tablets 2 to 3 x   /day ONLY if needed fot Nausea, Bloating, Cramping or Diarrhea .  lidocaine (LIDODERM) 5 %, Place 1 patch onto the skin daily as needed (pain). .  pregabalin (LYRICA) 150 MG capsule, Take 150 mg by mouth 2 (two) times daily. [name]  PRESCRIPTION MEDICATION, by Intrathecal route continuous. Fentanyl 2mg /ml solution . Daily dose 121.1 mg Compounded at [place] [phone] Pump is refilled every six months.  Allergies: Allergies  Allergen Reactions  . Codeine Itching  . Morphine And Related Other (See Comments)    hallucinations    Current Problems (verified) has COLONIC POLYPS; Hyperlipidemia  associated with type 2 diabetes mellitus ([place]); Essential hypertension; Venous (peripheral) insufficiency; COPD (chronic obstructive pulmonary disease) ([place]); Diverticulosis of large intestine; LOW BACK PAIN SYNDROME; DJD (degenerative joint disease); Poorly controlled type 2 diabetes mellitus ([place]); Vitamin D deficiency; Medication management; Failed back syndrome of lumbar spine; Chronic pain associated with significant psychosocial dysfunction; Spinal stenosis, multilevel; Spinal cord stimulator status; RSD (reflex sympathetic dystrophy); Overweight (BMI 25.0-29.9); Encounter for Medicare annual wellness exam; Cervical arthritis; Non-ischemic cardiomyopathy ([place]); CAD in native artery; LBBB (left bundle branch block); Atherosclerosis of aorta ([place]); Systolic CHF ([place]); Dysphagia; Esophageal stricture; Gastroesophageal reflux disease; Hypertensive heart disease with heart failure  ([place]); CKD stage 2 due to type 2 diabetes mellitus ([place]); History of COVID-19; Memory changes; B12 deficiency; Incontinence of feces; Memory loss; and Chronic midline low back pain with right-sided sciatica on their problem list.  Screening Tests Immunization History  Administered Date(s) Administered  . Influenza Split 04/22/2011, 01/20/2012  . Influenza Whole 01/15/2007, 01/21/2009, 03/08/2010  . Influenza, High Dose Seasonal PF 02/07/2013, 12/10/2015  . Influenza, Quadrivalent, Recombinant, Inj, Pf 01/17/2019  . Influenza-Unspecified 12/10/2013, 01/10/2015, 12/08/2016  . Pneumococcal Conjugate-13 06/09/2015  . Pneumococcal Polysaccharide-23 04/22/2011  . Tdap 04/11/2001, 05/07/2013   Preventative care: Last colonoscopy: 01/24/2013 - due 2024 EGD: 05/2017 Cath 04/2016 Echo 09/2017 CT chest 08/2016 Resolution of LEFT upper lobe pulmonary nodule, stable sub solid RIGHT pulmonary nodules measuring to 4 mm. No follow-up needed if patient is low-risk (and has no known or suspected primary neoplasm). Non-contrast chest CT can be considered in 12 months if patient is high-risk. *** 120 pack year history *** Ct head 07/2019  Prior vaccinations: TD or Tdap: 2015  Influenza: 2020  Pneumococcal: 2013 Prevnar13: 2017 Shingles/Zostavax: declines Covid 19: ***  Names of Other Physician/Practitioners you currently use: 1. [facility] here for primary care 2. Dr. Katy Fitch, eye doctor, last visit 09/18/2018 no retinopathy, abstracted  3. Has dentures , dentist   Patient Care Team: Unk Pinto, MD as PCP - General (Internal Medicine) Sable Feil, MD as Consulting Physician (Gastroenterology) Clent Jacks, MD as Consulting Physician (Ophthalmology) Melina Schools, MD as Consulting Physician (Orthopedic Surgery) Lucia Bitter., MD as Physician Assistant (Pain Medicine) Lennie Odor, MD as Referring Physician (Pain Medicine) [name], MD as Consulting Physician (Cardiology)  Surgical: He  has a past surgical history that includes Back surgery ([id number]); morphine pump (2009); excision of lipoma from right olecranon area (11/2000); microdiscectomy and decompression (05/2002); C3-4 anterior cervical discectomy and fusion with plating at c3-4 (05/2006); spinal cord stimulator implanted; subcut pain pump implanted; Pain pump implantation; Tonsillectomy; Colonoscopy w/ polypectomy; Knee arthroscopy; Total knee arthroplasty (02/29/2012); Lumbar laminectomy/decompression microdiscectomy (N/A, 01/08/2014); Knee arthroscopy (Right, 06/06/2014); Colonoscopy; Cardiac catheterization (N/A, 04/25/2016); Esophagogastroduodenoscopy (egd) with propofol (N/A, 06/06/2017); and Balloon dilation (N/A, 06/06/2017). Family His family history includes Lung cancer in his father; Other in his mother. Social history  He reports that he quit smoking about 17 years ago. His smoking use included cigarettes. He has a 120.00 pack-year smoking history. He has never used smokeless tobacco. He reports that he does not drink alcohol and does not use drugs.  Depression/mood screen:   Depression screen [facility] 2/9 05/09/2019  Decreased Interest 0  Down, Depressed, Hopeless 0  PHQ - 2 Score 0  Some recent data might be hidden     MMSE - Mini Mental State Exam 07/09/2019 05/09/2019  Orientation to time 3 3  Orientation to Place 5 5  Registration  3 3  Attention/ Calculation 5 5  Recall 0 1  Language- name 2 objects 2 2  Language- repeat 1 1  Language- follow 3 step command 3 3  Language- read & follow direction 1 1  Write a sentence 1 1  Copy design 1 1  Total score 25 26   ***  Review of Systems  Constitutional: Negative for malaise/fatigue and weight loss.  HENT: Negative for hearing loss and tinnitus.   Eyes: Negative for blurred vision and double vision.  Respiratory: Negative for cough, sputum production, shortness of breath and wheezing.    Cardiovascular: Negative for chest pain, palpitations, orthopnea, claudication, leg swelling and PND.  Gastrointestinal: Negative for abdominal pain, blood in stool, constipation, diarrhea, heartburn, melena, nausea and vomiting.  Genitourinary: Negative.   Musculoskeletal: Positive for back pain. Negative for falls, joint pain and myalgias.  Skin: Negative for rash.  Neurological: Negative for dizziness, tingling, sensory change, weakness and headaches.  Endo/Heme/Allergies: Negative for polydipsia.  Psychiatric/Behavioral: Positive for memory loss. Negative for depression, substance abuse and suicidal ideas. The patient is not nervous/anxious and does not have insomnia.   All other systems reviewed and are negative.    Objective:   There were no vitals filed for this visit. There is no height or weight on file to calculate BMI.  General appearance: alert, no distress, WD/WN, male HEENT: normocephalic, sclerae anicteric, TMs pearly, nares patent, no discharge or erythema, pharynx normal Oral cavity: MMM, no lesions Neck: supple, no lymphadenopathy, no thyromegaly, no masses Heart: RRR, normal S1, S2, distant heart sounds Lungs: CTA bilaterally, no wheezes, rhonchi, or rales Abdomen: +bs, soft, non tender, non distended, no masses, no hepatomegaly, no splenomegaly Musculoskeletal: nontender, no swelling, no obvious deformity Extremities: no edema, no cyanosis, no clubbing Pulses: 2+ symmetric, upper and lower extremities, normal cap refill Neurological: alert, oriented x 3, CN2-12 intact, strength normal upper extremities and lower extremities, sensation normal throughout,  Poor short term recall, Gait slow with cane Psychiatric: normal affect, behavior normal, pleasant   The patient's weight, height, BMI have been recorded in the chart.  I have made referrals, counseling, and provided education to the patient based on review of the above and I have provided the patient with a written  personalized care plan for preventive services.     Izora Ribas, NP   10/25/2019

## 2019-10-25 NOTE — Progress Notes (Signed)
.   Annual  Screening/Preventative Visit  & Comprehensive Evaluation & Examination     This very nice 74 y.o.  MWM presents for a Screening /Preventative Visit & comprehensive evaluation and management of multiple medical co-morbidities.  Patient has been followed for HTN, HLD, T2_NIDDM  and Vitamin D Deficiency.Patient also has COPD consequent of 100+ pack year smoking hx having quit about 15 years ago circa 2005.      Patient is followed at the [facility] for [place] his back surgery in 2010.  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 He last had a lumbar fusion in October 2016 by Dr Rolena Infante.     HTN predates since 2010. Patient's BP has been controlled at home.  Today's BP is at goal - 122/66. Patient is followed by Cardiology for chronic combined CHF.  Patient denies any cardiac symptoms as chest pain, palpitations, shortness of breath, dizziness or ankle swelling.     Patient's hyperlipidemia is controlled with diet and Atorvastatin. Patient denies myalgias or other medication SE's. Last lipids were at goal:  Lab Results  Component Value Date   CHOL 124 07/23/2019   HDL 32 (L) 07/23/2019   LDLCALC 74 07/23/2019   TRIG 98 07/23/2019   CHOLHDL 3.9 07/23/2019       Patient has hx/o T2_NIDDM (2007) mand he's currently on triple therapy with Metformin, Glimepiride and Trulicity.   Patient denies reactive hypoglycemic symptoms, visual blurring, diabetic polys or paresthesias. Last A1c was not at goal:  Lab Results  Component Value Date   HGBA1C 7.3 (H) 07/23/2019        Finally, patient has history of Vitamin D Deficiency  ("41" / 2016)  and last vitamin D was at goal:  Lab Results  Component Value Date   VD25OH 69 07/23/2019    Current Outpatient Medications on File Prior to Visit  Medication Sig  . aspirin 81 MG chewable tablet  Chew 81 mg by mouth at bedtime.  [name] atorvastatin (LIPITOR) 80 MG tablet TAKE 0.5 TABLETS (40 MG TOTAL) BY MOUTH DAILY AT 6 PM. (Patient taking differently: Take 80 mg by mouth daily at 6 PM. )  . Cholecalciferol (VITAMIN D-3) 5000 UNITS TABS Take 5,000 Units by mouth 2 (two) times daily.  . Dulaglutide (TRULICITY) 1.5 WJ/1.9JY SOPN Inject 1.5 mg into the skin once a week. For diabetes and weight loss.  . DULoxetine (CYMBALTA) 60 MG capsule Take 60 mg by mouth daily.   [name] ENTRESTO 49-51 MG TAKE 1 TABLET BY MOUTH TWICE A DAY (Patient taking differently: 0.5 tablets. )  . glimepiride (AMARYL) 4 MG tablet TAKE 1 TABLET TWICE DAILY WITH MEALS. DO NOT TAKE IF YOU DO NOT EAT.  . hyoscyamine (LEVSIN) 0.125 MG tablet Take 1 to 2 tablets 2 to 3 x   /day ONLY if needed fot Nausea, Bloating, Cramping or Diarrhea  . lidocaine (LIDODERM) 5 % Place 1 patch onto the skin daily as needed (pain).  . metFORMIN (GLUCOPHAGE-XR) 500 MG 24 hr tablet Take 2 tablets 2 x /day with Meals for Diabetes  . pregabalin (LYRICA) 150 MG capsule Take 150 mg by mouth 2 (two) times daily.  [name] PRESCRIPTION MEDICATION by Intrathecal route continuous. Fentanyl 2mg /ml solution . Daily dose 121.1 mg Compounded at [place] [phone] Pump is refilled every six months.  [name]  spironolactone (ALDACTONE) 25 MG tablet Take 1 tablet (25 mg total) by mouth daily. Last refill without office visit, please call [phone]  . Tapentadol HCl (NUCYNTA) 100 MG TABS Take 100 mg by mouth See admin instructions. Take 100 mg by mouth twice daily, may also take 100 mg extra during the day as needed for pain   No current facility-administered medications on file prior to visit.   Allergies  Allergen Reactions  . Codeine Itching  . Morphine And Related Other (See Comments)    hallucinations   Past Medical History:  Diagnosis Date  . Allergy   . Anemia   . Arthritis    right hand  . Borderline hypertension   . CHF  (congestive heart failure) ([place])    pt denies  . Clausterphobic   . Clotting disorder (HCC)    bleeds easily-no dx  . COPD (chronic obstructive pulmonary disease) ([place])    denies  . Depression    hx of   . Diabetes mellitus    type 2  . Diverticulosis of colon   . Full dentures   . GERD (gastroesophageal reflux disease)    pt denoes  . Hx of colonic polyps   . Hyperlipidemia   . Hypertension   . Lipoma   . Low back pain   . Memory loss   . Mental disorder   . Obesity   . Pneumonia    hx  . RSD (reflex sympathetic dystrophy)   . Venous insufficiency    Health Maintenance  Topic Date Due  . COVID-19 Vaccine (1) Never done  . URINE MICROALBUMIN  09/18/2019  . INFLUENZA VACCINE  11/10/2019  . HEMOGLOBIN A1C  01/22/2020  . OPHTHALMOLOGY EXAM  06/03/2020  . FOOT EXAM  10/26/2020  . COLONOSCOPY  01/25/2023  . TETANUS/TDAP  05/08/2023  . Hepatitis C Screening  Completed  . PNA vac Low Risk Adult  Completed   Immunization History  Administered Date(s) Administered  . Influenza Split 04/22/2011, 01/20/2012  . Influenza Whole 01/15/2007, 01/21/2009, 03/08/2010  . Influenza, High Dose Seasonal PF 02/07/2013, 12/10/2015, 12/08/2016  . Influenza, Quadrivalent, Recombinant, Inj, Pf 01/17/2019  . Influenza,inj,Quad PF,6+ Mos 01/18/2018  . Influenza-Unspecified 12/10/2013, 01/10/2015, 12/08/2016  . Pneumococcal Conjugate-13 06/09/2015  . Pneumococcal Polysaccharide-23 04/22/2011  . Tdap 04/11/2001, 05/07/2013   Last Colon - 01/24/2013 - Dr Henrene Pastor Recc 10 yr f/u  due Oct 2024  Past Surgical History:  Procedure Laterality Date  . BACK SURGERY  2005,2007  . BALLOON DILATION N/A 06/06/2017   Procedure: BALLOON DILATION;  Surgeon: Irene Shipper, MD;  Location: [facility];  Service: Endoscopy;  Laterality: N/A;  . C3-4 anterior cervical discectomy and fusion with plating at c3-4  05/2006   Dr. Arnoldo Morale  . CARDIAC CATHETERIZATION N/A 04/25/2016   Procedure: Right/Left Heart Cath  and Coronary Angiography;  Surgeon: Peter M Martinique, MD;  Location: [facility];  Service: Cardiovascular;  Laterality: N/A;  . COLONOSCOPY    . COLONOSCOPY W/ POLYPECTOMY    . ESOPHAGOGASTRODUODENOSCOPY (EGD) WITH PROPOFOL N/A 06/06/2017   Procedure: ESOPHAGOGASTRODUODENOSCOPY (EGD) WITH PROPOFOL;  Surgeon: Irene Shipper, MD;  Location: [facility];  Service: Endoscopy;  Laterality: N/A;  . excision of lipoma from right olecranon area  11/2000   Dr. Rise [name]  . KNEE ARTHROSCOPY     right knee  . KNEE ARTHROSCOPY Right 06/06/2014   Procedure: ARTHROSCOPY RIGHT KNEE WITH REMOVAL OF FIBROUS BANDS;  Surgeon: Kerin Salen, MD;  Location: [facility];  Service: Orthopedics;  Laterality: Right;  . LUMBAR LAMINECTOMY/DECOMPRESSION MICRODISCECTOMY N/A 01/08/2014   Procedure: L4-S1 Decompression with removal and reimplantation of spinal cord stimulator battery ;  Surgeon: Melina Schools, MD;  Location: [place];  Service: Orthopedics;  Laterality: N/A;  . microdiscectomy and decompression  05/2002   Dr. Tonita Cong  . morphine pump  2009   due to reaction to morphine/changed to fentanyl  . PAIN PUMP IMPLANTATION     with fentanyl  . spinal cord stimulator implanted     for pain per Dr. Maryruth Eve  . subcut pain pump implanted    . TONSILLECTOMY    . TOTAL KNEE ARTHROPLASTY  02/29/2012   Procedure: TOTAL KNEE ARTHROPLASTY;  Surgeon: Kerin Salen, MD;  Location: [facility];  Service: Orthopedics;  Laterality: Right;   Family History  Problem Relation Age of Onset  . Lung cancer Father   . Other Mother        "died from old age" at 64  . Colon cancer Neg Hx   . Esophageal cancer Neg Hx   . Stomach cancer Neg Hx   . Rectal cancer Neg Hx    Social History   Socioeconomic History  . Marital status: Married    Spouse name: [name]  . Number of children: 2  . Years of education: 2 years of college  . Highest education level: Not on file  Occupational History  . Occupation: retired Engineer, maintenance  Tobacco Use  . Smoking status: Former Smoker    Packs/day: 3.00    Years: 40.00    Pack years: 120.00    Types: Cigarettes    Quit date: 05/25/2002    Years since quitting: 17.4  . Smokeless tobacco: Never Used  Vaping Use  . Vaping Use: Never used  Substance and Sexual Activity  . Alcohol use: No  . Drug use: No  . Sexual activity: Never  Social History Narrative   Lives at home with his wife.   Right-handed.   Two cups caffeine per day.     ROS Constitutional: Denies fever, chills, weight loss/gain, headaches, insomnia,  night sweats or change in appetite. Does c/o fatigue. Eyes: Denies redness, blurred vision, diplopia, discharge, itchy or watery eyes.  ENT: Denies discharge, congestion, post nasal drip, epistaxis, sore throat, earache, hearing loss, dental pain, Tinnitus, Vertigo, Sinus pain or snoring.  Cardio: Denies chest pain, palpitations, irregular heartbeat, syncope, dyspnea, diaphoresis, orthopnea, PND, claudication or edema Respiratory: denies cough, dyspnea, DOE, pleurisy, hoarseness, laryngitis or wheezing.  Gastrointestinal: Denies dysphagia, heartburn, reflux, water brash, pain, cramps, nausea, vomiting, bloating, diarrhea, constipation, hematemesis, melena, hematochezia, jaundice or hemorrhoids Genitourinary: Denies dysuria, frequency, urgency, nocturia, hesitancy, discharge, hematuria or flank pain Musculoskeletal: Denies arthralgia, myalgia, stiffness, Jt. Swelling, pain, limp or strain/sprain. Denies Falls. Skin: Denies puritis, rash, hives, warts, acne, eczema or change in skin lesion Neuro: No weakness, tremor, incoordination, spasms, paresthesia or pain Psychiatric: Denies confusion, memory loss or sensory loss. Denies Depression. Endocrine: Denies change in weight, skin, hair change, nocturia, and paresthesia, diabetic polys, visual blurring or hyper / hypo glycemic episodes.  Heme/Lymph: No excessive bleeding, bruising or enlarged lymph  nodes.  Physical Exam  BP 122/66   Pulse 92   Temp (!) 97.3 F (36.3 C)   Resp 16   Ht 5\' 7"  (1.702 m)   Wt 181 lb 6.4 oz (82.3 kg)   BMI 28.41 kg/m   General Appearance: Well nourished and well groomed and in no  apparent distress.  Eyes: PERRLA, EOMs, conjunctiva no swelling or erythema, normal fundi and vessels. Sinuses: No frontal/maxillary tenderness ENT/Mouth: EACs patent / TMs  nl. Nares clear without erythema, swelling, mucoid exudates. Oral hygiene is good. No erythema, swelling, or exudate. Tongue normal, non-obstructing. Tonsils not swollen or erythematous. Hearing normal.  Neck: Supple, thyroid not palpable. No bruits, nodes or JVD. Respiratory: Respiratory effort normal.  BS equal and clear bilateral without rales, rhonci, wheezing or stridor. Cardio: Heart sounds are normal with regular rate and rhythm and no murmurs, rubs or gallops. Peripheral pulses are normal and equal bilaterally without edema. No aortic or femoral bruits. Chest: symmetric with normal excursions and percussion.  Abdomen: Soft, with Nl bowel sounds. Nontender, no guarding, rebound, hernias, masses, or organomegaly.  Lymphatics: Non tender without lymphadenopathy.  Musculoskeletal: Full ROM all peripheral extremities, joint stability, 5/5 strength, and normal gait. Skin: Warm and dry without rashes, lesions, cyanosis, clubbing or  ecchymosis.  Neuro: Cranial nerves intact, reflexes equal bilaterally. Normal muscle tone, no cerebellar symptoms. Sensation intact.  Pysch: Alert and oriented X 3 with normal affect, insight and judgment appropriate.   Assessment and Plan  1. Annual Preventative/Screening Exam   2. Hypertensive heart disease with heart failure (HCC)  - EKG 12-Lead - [place], RETROPERITNL ABD,  LTD - Urinalysis, Routine w reflex microscopic - Microalbumin / creatinine urine ratio - CBC with Differential/Platelet - COMPLETE METABOLIC PANEL WITH GFR - Magnesium - TSH  3. Hyperlipidemia  associated with type 2 diabetes mellitus ([place])  - EKG 12-Lead - [place], RETROPERITNL ABD,  LTD - Lipid panel - TSH  4. CKD stage 2 due to type 2 diabetes mellitus (HCC)  - EKG 12-Lead - [place], RETROPERITNL ABD,  LTD - Urinalysis, Routine w reflex microscopic - Microalbumin / creatinine urine ratio - HM DIABETES FOOT EXAM - LOW EXTREMITY NEUR EXAM DOCUM - Hemoglobin A1c - Insulin, random  5. Vitamin D deficiency  - VITAMIN D 25 Hydroxy  6. Gastroesophageal reflux disease  - CBC with Differential/Platelet  7. Poorly controlled type 2 diabetes mellitus (HCC)  - Hemoglobin A1c - Insulin, random  8. LBBB (left bundle branch block)  - EKG 12-Lead  9. Chronic bronchitis ([place])   10. B12 deficiency  - Vitamin B12  11. 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 Chronic bilateral low back pain without sciatica   14. Screening for colorectal cancer  - POC Hemoccult Bld/Stl  15. Screening for prostate cancer  - PSA  16. Screening for ischemic heart disease  - EKG 12-Lead  17. FHx: heart disease  - EKG 12-Lead - [place], RETROPERITNL ABD,  LTD  18. Former smoker  - EKG 12-Lead - [place], RETROPERITNL ABD,  LTD  19. Atherosclerosis of aorta ([place])  - [place], RETROPERITNL ABD,  LTD  20. Medication management  - Urinalysis, Routine w reflex microscopic - Microalbumin / creatinine urine ratio - Vitamin B12 - CBC with Differential/Platelet - COMPLETE METABOLIC PANEL WITH GFR - Magnesium - Lipid panel - TSH - Hemoglobin A1c - Insulin, random - VITAMIN D 25 Hydroxyl         Patient was counseled in prudent diet, weight control to achieve/maintain BMI less than 25, BP monitoring, regular exercise and medications as discussed.  Discussed med effects and SE's. Routine screening labs and tests as requested with regular follow-up as recommended. Over 40 minutes of exam, counseling,  chart review and  high complex critical decision making was performed   Kirtland Bouchard, MD \

## 2019-10-27 NOTE — Patient Instructions (Signed)

## 2019-10-28 ENCOUNTER — Encounter: Payer: Self-pay | Admitting: Internal Medicine

## 2019-10-28 ENCOUNTER — Other Ambulatory Visit: Payer: Self-pay

## 2019-10-28 ENCOUNTER — Ambulatory Visit: Payer: Medicare PPO | Admitting: Internal Medicine

## 2019-10-28 VITALS — BP 122/66 | HR 92 | Temp 97.3°F | Resp 16 | Ht 67.0 in | Wt 181.4 lb

## 2019-10-28 DIAGNOSIS — I5022 Chronic systolic (congestive) heart failure: Secondary | ICD-10-CM | POA: Diagnosis not present

## 2019-10-28 DIAGNOSIS — Z13220 Encounter for screening for lipoid disorders: Secondary | ICD-10-CM | POA: Diagnosis not present

## 2019-10-28 DIAGNOSIS — Z125 Encounter for screening for malignant neoplasm of prostate: Secondary | ICD-10-CM | POA: Diagnosis not present

## 2019-10-28 DIAGNOSIS — E559 Vitamin D deficiency, unspecified: Secondary | ICD-10-CM | POA: Diagnosis not present

## 2019-10-28 DIAGNOSIS — Z8249 Family history of ischemic heart disease and other diseases of the circulatory system: Secondary | ICD-10-CM | POA: Diagnosis not present

## 2019-10-28 DIAGNOSIS — E785 Hyperlipidemia, unspecified: Secondary | ICD-10-CM | POA: Diagnosis not present

## 2019-10-28 DIAGNOSIS — Z87891 Personal history of nicotine dependence: Secondary | ICD-10-CM | POA: Diagnosis not present

## 2019-10-28 DIAGNOSIS — N182 Chronic kidney disease, stage 2 (mild): Secondary | ICD-10-CM

## 2019-10-28 DIAGNOSIS — I447 Left bundle-branch block, unspecified: Secondary | ICD-10-CM

## 2019-10-28 DIAGNOSIS — Z1389 Encounter for screening for other disorder: Secondary | ICD-10-CM | POA: Diagnosis not present

## 2019-10-28 DIAGNOSIS — E1169 Type 2 diabetes mellitus with other specified complication: Secondary | ICD-10-CM

## 2019-10-28 DIAGNOSIS — I700 Atherosclerosis of aorta: Secondary | ICD-10-CM | POA: Diagnosis not present

## 2019-10-28 DIAGNOSIS — Z79899 Other long term (current) drug therapy: Secondary | ICD-10-CM

## 2019-10-28 DIAGNOSIS — J42 Unspecified chronic bronchitis: Secondary | ICD-10-CM

## 2019-10-28 DIAGNOSIS — I2510 Atherosclerotic heart disease of native coronary artery without angina pectoris: Secondary | ICD-10-CM

## 2019-10-28 DIAGNOSIS — I10 Essential (primary) hypertension: Secondary | ICD-10-CM

## 2019-10-28 DIAGNOSIS — Z1211 Encounter for screening for malignant neoplasm of colon: Secondary | ICD-10-CM

## 2019-10-28 DIAGNOSIS — N401 Enlarged prostate with lower urinary tract symptoms: Secondary | ICD-10-CM

## 2019-10-28 DIAGNOSIS — E1122 Type 2 diabetes mellitus with diabetic chronic kidney disease: Secondary | ICD-10-CM

## 2019-10-28 DIAGNOSIS — G8929 Other chronic pain: Secondary | ICD-10-CM

## 2019-10-28 DIAGNOSIS — E1165 Type 2 diabetes mellitus with hyperglycemia: Secondary | ICD-10-CM | POA: Diagnosis not present

## 2019-10-28 DIAGNOSIS — I110 Hypertensive heart disease with heart failure: Secondary | ICD-10-CM

## 2019-10-28 DIAGNOSIS — Z131 Encounter for screening for diabetes mellitus: Secondary | ICD-10-CM | POA: Diagnosis not present

## 2019-10-28 DIAGNOSIS — N138 Other obstructive and reflux uropathy: Secondary | ICD-10-CM

## 2019-10-28 DIAGNOSIS — E538 Deficiency of other specified B group vitamins: Secondary | ICD-10-CM

## 2019-10-28 DIAGNOSIS — Z0000 Encounter for general adult medical examination without abnormal findings: Secondary | ICD-10-CM | POA: Diagnosis not present

## 2019-10-28 DIAGNOSIS — Z136 Encounter for screening for cardiovascular disorders: Secondary | ICD-10-CM | POA: Diagnosis not present

## 2019-10-28 DIAGNOSIS — K219 Gastro-esophageal reflux disease without esophagitis: Secondary | ICD-10-CM

## 2019-10-28 DIAGNOSIS — R350 Frequency of micturition: Secondary | ICD-10-CM | POA: Diagnosis not present

## 2019-10-28 DIAGNOSIS — Z0001 Encounter for general adult medical examination with abnormal findings: Secondary | ICD-10-CM

## 2019-10-28 DIAGNOSIS — Z1212 Encounter for screening for malignant neoplasm of rectum: Secondary | ICD-10-CM

## 2019-10-29 ENCOUNTER — Other Ambulatory Visit: Payer: Self-pay | Admitting: Internal Medicine

## 2019-10-29 DIAGNOSIS — D649 Anemia, unspecified: Secondary | ICD-10-CM

## 2019-10-29 DIAGNOSIS — E538 Deficiency of other specified B group vitamins: Secondary | ICD-10-CM

## 2019-10-29 LAB — CBC WITH DIFFERENTIAL/PLATELET
Absolute Monocytes: 569 cells/uL (ref 200–950)
Basophils Absolute: 36 cells/uL (ref 0–200)
Basophils Relative: 0.3 %
Eosinophils Absolute: 254 cells/uL (ref 15–500)
Eosinophils Relative: 2.1 %
HCT: 37.5 % — ABNORMAL LOW (ref 38.5–50.0)
Hemoglobin: 11.7 g/dL — ABNORMAL LOW (ref 13.2–17.1)
Lymphs Abs: 2904 cells/uL (ref 850–3900)
MCH: 26 pg — ABNORMAL LOW (ref 27.0–33.0)
MCHC: 31.2 g/dL — ABNORMAL LOW (ref 32.0–36.0)
MCV: 83.3 fL (ref 80.0–100.0)
MPV: 10.2 fL (ref 7.5–12.5)
Monocytes Relative: 4.7 %
Neutro Abs: 8337 cells/uL — ABNORMAL HIGH (ref 1500–7800)
Neutrophils Relative %: 68.9 %
Platelets: 556 10*3/uL — ABNORMAL HIGH (ref 140–400)
RBC: 4.5 10*6/uL (ref 4.20–5.80)
RDW: 13.4 % (ref 11.0–15.0)
Total Lymphocyte: 24 %
WBC: 12.1 10*3/uL — ABNORMAL HIGH (ref 3.8–10.8)

## 2019-10-29 LAB — MAGNESIUM: Magnesium: 1.8 mg/dL (ref 1.5–2.5)

## 2019-10-29 LAB — URINALYSIS, ROUTINE W REFLEX MICROSCOPIC
Bacteria, UA: NONE SEEN /HPF
Bilirubin Urine: NEGATIVE
Glucose, UA: NEGATIVE
Hgb urine dipstick: NEGATIVE
Hyaline Cast: NONE SEEN /LPF
Ketones, ur: NEGATIVE
Nitrite: NEGATIVE
Protein, ur: NEGATIVE
RBC / HPF: NONE SEEN /HPF (ref 0–2)
Specific Gravity, Urine: 1.011 (ref 1.001–1.03)
WBC, UA: NONE SEEN /HPF (ref 0–5)
pH: 6.5 (ref 5.0–8.0)

## 2019-10-29 LAB — COMPLETE METABOLIC PANEL WITH GFR
AG Ratio: 1.3 (calc) (ref 1.0–2.5)
ALT: 10 U/L (ref 9–46)
AST: 9 U/L — ABNORMAL LOW (ref 10–35)
Albumin: 3.9 g/dL (ref 3.6–5.1)
Alkaline phosphatase (APISO): 108 U/L (ref 35–144)
BUN: 11 mg/dL (ref 7–25)
CO2: 29 mmol/L (ref 20–32)
Calcium: 10.1 mg/dL (ref 8.6–10.3)
Chloride: 98 mmol/L (ref 98–110)
Creat: 0.77 mg/dL (ref 0.70–1.18)
GFR, Est African American: 104 mL/min/{1.73_m2} (ref >=60)
GFR, Est Non African American: 90 mL/min/{1.73_m2} (ref >=60)
Globulin: 3.1 g/dL (calc) (ref 1.9–3.7)
Glucose, Bld: 198 mg/dL — ABNORMAL HIGH (ref 65–99)
Potassium: 5.4 mmol/L — ABNORMAL HIGH (ref 3.5–5.3)
Sodium: 139 mmol/L (ref 135–146)
Total Bilirubin: 0.4 mg/dL (ref 0.2–1.2)
Total Protein: 7 g/dL (ref 6.1–8.1)

## 2019-10-29 LAB — TSH: TSH: 3.35 mIU/L (ref 0.40–4.50)

## 2019-10-29 LAB — LIPID PANEL
Cholesterol: 129 mg/dL (ref <200)
HDL: 35 mg/dL — ABNORMAL LOW (ref >=40)
LDL Cholesterol (Calc): 72 mg/dL (calc)
Non-HDL Cholesterol (Calc): 94 mg/dL (calc) (ref <130)
Total CHOL/HDL Ratio: 3.7 (calc) (ref <5.0)
Triglycerides: 137 mg/dL (ref <150)

## 2019-10-29 LAB — MICROALBUMIN / CREATININE URINE RATIO
Creatinine, Urine: 81 mg/dL (ref 20–320)
Microalb Creat Ratio: 6 mcg/mg creat (ref <30)
Microalb, Ur: 0.5 mg/dL

## 2019-10-29 LAB — VITAMIN D 25 HYDROXY (VIT D DEFICIENCY, FRACTURES): Vit D, 25-Hydroxy: 55 ng/mL (ref 30–100)

## 2019-10-29 LAB — VITAMIN B12: Vitamin B-12: 267 pg/mL (ref 200–1100)

## 2019-10-29 LAB — HEMOGLOBIN A1C
Hgb A1c MFr Bld: 7.2 % of total Hgb — ABNORMAL HIGH (ref <5.7)
Mean Plasma Glucose: 160 (calc)
eAG (mmol/L): 8.9 (calc)

## 2019-10-29 LAB — INSULIN, RANDOM: Insulin: 18.6 u[IU]/mL

## 2019-10-29 LAB — PSA: PSA: 0.5 ng/mL (ref <=4.0)

## 2019-10-29 NOTE — Progress Notes (Signed)
==========================================================  -  PSA - very Low - Great ==========================================================  -  CBC shows Hgb or red cell count is down slightly from 13.2 to now 11.7 gm%  - So.............. need to recheck in 2 weeks - please call office to schedule a lab visit   - Also , please monitor BM's for blood or "black tarry " stools  ==========================================================  -  Glucose 198 mg%  too high (Goal is less than 130 mg%) and   - A1c = 7.2%  also too high  (Goal is less than 6.0%) ,   -  So..................     Being diabetic has a  300% increased risk for heart attack,  stroke, cancer, and alzheimer- type vascular dementia.   It is very important that you work harder with diet by  avoiding all foods that are white except chicken,   fish & calliflower.  - Avoid white rice  (brown & wild rice is OK),   - Avoid white potatoes  (sweet potatoes in moderation is OK),   White bread or wheat bread or anything made out of   white flour like bagels, donuts, rolls, buns, biscuits, cakes,  - pastries, cookies, pizza crust, and pasta (made from  white flour & egg whites)   - vegetarian pasta or spinach or wheat pasta is OK.  - Multigrain breads like Arnold's, Pepperidge Farm or   multigrain sandwich thins or high fiber breads like   [place] bread or "Dave's Killer" breads that are  4 to 5 grams fiber per slice !  are best.   ==========================================================  -   Magnesium  -   1.8   -  very  low- goal is betw 2.0 - 2.5,   - So..............[name]  Recommend that you take  Magnesium 500 mg tablet x 2 tablets / daily   - also important to eat lots of  leafy green vegetables   - spinach - Kale - collards - greens - okra - asparagus  - broccoli - quinoa - squash - almonds   - black, red, white beans  -  peas - green  beans ==========================================================  -  Total Chol = 129 and LDL Chol = 72 - Both  Excellent   - Very low risk for Heart Attack  / Stroke ==========================================================  - Voitamin D = 55  is "OK"  - Vitamin D goal is between 70-100.   - Please make sure that you are taking your Vitamin D as directed.   - It is very important as a natural anti-inflammatory and helping the  immune system protect against viral infections, like the Covid-19    helping hair, skin, and nails, as well as reducing stroke and heart attack risk.   - It helps your bones and helps with mood.  - It also decreases numerous cancer risks so please take it as directed.   - Low Vit D is associated with a 200-300% higher risk for CANCER   and 200-300% higher risk for HEART   ATTACK  &  STROKE.    - It is also associated with higher death rate at younger ages,   autoimmune diseases like Rheumatoid arthritis, Lupus, Multiple Sclerosis.     - Also many other serious conditions, like depression, Alzheimer's  Dementia, infertility, muscle aches, fatigue, fibromyalgia - just to name a few.  ==========================================================  -  All Else - CBC - Kidneys - Electrolytes - Liver & Thyroid    - all  Normal /  OK ====================================================

## 2019-11-03 ENCOUNTER — Inpatient Hospital Stay (HOSPITAL_COMMUNITY)
Admission: EM | Admit: 2019-11-03 | Discharge: 2019-11-05 | DRG: 315 | Disposition: A | Discharge status: Home Health | Payer: Medicare PPO | Attending: Internal Medicine | Admitting: Internal Medicine

## 2019-11-03 ENCOUNTER — Emergency Department (HOSPITAL_COMMUNITY): Payer: Medicare PPO

## 2019-11-03 ENCOUNTER — Encounter (HOSPITAL_COMMUNITY): Payer: Self-pay | Admitting: Emergency Medicine

## 2019-11-03 ENCOUNTER — Other Ambulatory Visit: Payer: Self-pay

## 2019-11-03 DIAGNOSIS — I517 Cardiomegaly: Secondary | ICD-10-CM | POA: Diagnosis not present

## 2019-11-03 DIAGNOSIS — I2510 Atherosclerotic heart disease of native coronary artery without angina pectoris: Secondary | ICD-10-CM | POA: Diagnosis not present

## 2019-11-03 DIAGNOSIS — Z9689 Presence of other specified functional implants: Secondary | ICD-10-CM

## 2019-11-03 DIAGNOSIS — I129 Hypertensive chronic kidney disease with stage 1 through stage 4 chronic kidney disease, or unspecified chronic kidney disease: Secondary | ICD-10-CM | POA: Diagnosis present

## 2019-11-03 DIAGNOSIS — G894 Chronic pain syndrome: Secondary | ICD-10-CM | POA: Diagnosis present

## 2019-11-03 DIAGNOSIS — E1165 Type 2 diabetes mellitus with hyperglycemia: Secondary | ICD-10-CM | POA: Diagnosis present

## 2019-11-03 DIAGNOSIS — E876 Hypokalemia: Secondary | ICD-10-CM | POA: Diagnosis present

## 2019-11-03 DIAGNOSIS — N182 Chronic kidney disease, stage 2 (mild): Secondary | ICD-10-CM | POA: Diagnosis present

## 2019-11-03 DIAGNOSIS — K5730 Diverticulosis of large intestine without perforation or abscess without bleeding: Secondary | ICD-10-CM | POA: Diagnosis present

## 2019-11-03 DIAGNOSIS — J449 Chronic obstructive pulmonary disease, unspecified: Secondary | ICD-10-CM | POA: Diagnosis present

## 2019-11-03 DIAGNOSIS — D649 Anemia, unspecified: Secondary | ICD-10-CM

## 2019-11-03 DIAGNOSIS — N179 Acute kidney failure, unspecified: Secondary | ICD-10-CM | POA: Diagnosis present

## 2019-11-03 DIAGNOSIS — G9389 Other specified disorders of brain: Secondary | ICD-10-CM | POA: Diagnosis not present

## 2019-11-03 DIAGNOSIS — G9050 Complex regional pain syndrome I, unspecified: Secondary | ICD-10-CM | POA: Diagnosis present

## 2019-11-03 DIAGNOSIS — E1169 Type 2 diabetes mellitus with other specified complication: Secondary | ICD-10-CM | POA: Diagnosis present

## 2019-11-03 DIAGNOSIS — Z20822 Contact with and (suspected) exposure to covid-19: Secondary | ICD-10-CM | POA: Diagnosis present

## 2019-11-03 DIAGNOSIS — I700 Atherosclerosis of aorta: Secondary | ICD-10-CM | POA: Diagnosis not present

## 2019-11-03 DIAGNOSIS — R531 Weakness: Secondary | ICD-10-CM | POA: Diagnosis not present

## 2019-11-03 DIAGNOSIS — Z7982 Long term (current) use of aspirin: Secondary | ICD-10-CM

## 2019-11-03 DIAGNOSIS — R55 Syncope and collapse: Secondary | ICD-10-CM | POA: Diagnosis present

## 2019-11-03 DIAGNOSIS — K219 Gastro-esophageal reflux disease without esophagitis: Secondary | ICD-10-CM | POA: Diagnosis present

## 2019-11-03 DIAGNOSIS — R918 Other nonspecific abnormal finding of lung field: Secondary | ICD-10-CM

## 2019-11-03 DIAGNOSIS — Z794 Long term (current) use of insulin: Secondary | ICD-10-CM

## 2019-11-03 DIAGNOSIS — I428 Other cardiomyopathies: Secondary | ICD-10-CM | POA: Diagnosis present

## 2019-11-03 DIAGNOSIS — E875 Hyperkalemia: Secondary | ICD-10-CM | POA: Diagnosis present

## 2019-11-03 DIAGNOSIS — E1122 Type 2 diabetes mellitus with diabetic chronic kidney disease: Secondary | ICD-10-CM | POA: Diagnosis present

## 2019-11-03 DIAGNOSIS — Z801 Family history of malignant neoplasm of trachea, bronchus and lung: Secondary | ICD-10-CM

## 2019-11-03 DIAGNOSIS — E871 Hypo-osmolality and hyponatremia: Secondary | ICD-10-CM | POA: Diagnosis present

## 2019-11-03 DIAGNOSIS — Z885 Allergy status to narcotic agent status: Secondary | ICD-10-CM | POA: Diagnosis not present

## 2019-11-03 DIAGNOSIS — W19XXXA Unspecified fall, initial encounter: Secondary | ICD-10-CM

## 2019-11-03 DIAGNOSIS — R42 Dizziness and giddiness: Secondary | ICD-10-CM

## 2019-11-03 DIAGNOSIS — S0990XA Unspecified injury of head, initial encounter: Secondary | ICD-10-CM | POA: Diagnosis not present

## 2019-11-03 DIAGNOSIS — J9809 Other diseases of bronchus, not elsewhere classified: Secondary | ICD-10-CM | POA: Diagnosis not present

## 2019-11-03 DIAGNOSIS — E785 Hyperlipidemia, unspecified: Secondary | ICD-10-CM | POA: Diagnosis present

## 2019-11-03 DIAGNOSIS — Z96651 Presence of right artificial knee joint: Secondary | ICD-10-CM | POA: Diagnosis present

## 2019-11-03 DIAGNOSIS — R590 Localized enlarged lymph nodes: Secondary | ICD-10-CM | POA: Diagnosis not present

## 2019-11-03 DIAGNOSIS — Z79899 Other long term (current) drug therapy: Secondary | ICD-10-CM

## 2019-11-03 DIAGNOSIS — I959 Hypotension, unspecified: Principal | ICD-10-CM | POA: Diagnosis present

## 2019-11-03 DIAGNOSIS — Z87891 Personal history of nicotine dependence: Secondary | ICD-10-CM

## 2019-11-03 DIAGNOSIS — D631 Anemia in chronic kidney disease: Secondary | ICD-10-CM | POA: Diagnosis present

## 2019-11-03 DIAGNOSIS — I10 Essential (primary) hypertension: Secondary | ICD-10-CM | POA: Diagnosis present

## 2019-11-03 LAB — URINALYSIS, ROUTINE W REFLEX MICROSCOPIC
Bilirubin Urine: NEGATIVE
Glucose, UA: 150 mg/dL — AB
Hgb urine dipstick: NEGATIVE
Ketones, ur: NEGATIVE mg/dL
Leukocytes,Ua: NEGATIVE
Nitrite: NEGATIVE
Protein, ur: NEGATIVE mg/dL
Specific Gravity, Urine: 1.01 (ref 1.005–1.030)
pH: 5 (ref 5.0–8.0)

## 2019-11-03 LAB — BASIC METABOLIC PANEL WITH GFR
Anion gap: 11 (ref 5–15)
BUN: 18 mg/dL (ref 8–23)
CO2: 24 mmol/L (ref 22–32)
Calcium: 8.7 mg/dL — ABNORMAL LOW (ref 8.9–10.3)
Chloride: 93 mmol/L — ABNORMAL LOW (ref 98–111)
Creatinine, Ser: 1.18 mg/dL (ref 0.61–1.24)
GFR calc Af Amer: >60 mL/min
GFR calc non Af Amer: >60 mL/min
Glucose, Bld: 150 mg/dL — ABNORMAL HIGH (ref 70–99)
Potassium: 4.5 mmol/L (ref 3.5–5.1)
Sodium: 128 mmol/L — ABNORMAL LOW (ref 135–145)

## 2019-11-03 LAB — CBC
HCT: 32.7 % — ABNORMAL LOW (ref 39.0–52.0)
HCT: 33.7 % — ABNORMAL LOW (ref 39.0–52.0)
Hemoglobin: 10.1 g/dL — ABNORMAL LOW (ref 13.0–17.0)
Hemoglobin: 10.2 g/dL — ABNORMAL LOW (ref 13.0–17.0)
MCH: 25.8 pg — ABNORMAL LOW (ref 26.0–34.0)
MCH: 25.7 pg — ABNORMAL LOW (ref 26.0–34.0)
MCHC: 30.9 g/dL (ref 30.0–36.0)
MCHC: 30.3 g/dL (ref 30.0–36.0)
MCV: 83.6 fL (ref 80.0–100.0)
MCV: 84.9 fL (ref 80.0–100.0)
Platelets: 403 K/uL — ABNORMAL HIGH (ref 150–400)
Platelets: 326 10*3/uL (ref 150–400)
RBC: 3.91 MIL/uL — ABNORMAL LOW (ref 4.22–5.81)
RBC: 3.97 MIL/uL — ABNORMAL LOW (ref 4.22–5.81)
RDW: 14.3 % (ref 11.5–15.5)
RDW: 14.1 % (ref 11.5–15.5)
WBC: 14.3 K/uL — ABNORMAL HIGH (ref 4.0–10.5)
WBC: 11.8 10*3/uL — ABNORMAL HIGH (ref 4.0–10.5)
nRBC: 0 % (ref 0.0–0.2)
nRBC: 0 % (ref 0.0–0.2)

## 2019-11-03 LAB — CREATININE, SERUM
Creatinine, Ser: 0.93 mg/dL (ref 0.61–1.24)
GFR calc Af Amer: >60 mL/min (ref >60)
GFR calc non Af Amer: >60 mL/min (ref >60)

## 2019-11-03 LAB — TYPE AND SCREEN
ABO/RH(D): O POS
Antibody Screen: NEGATIVE

## 2019-11-03 LAB — COMPREHENSIVE METABOLIC PANEL WITH GFR
ALT: 11 U/L (ref 0–44)
AST: 10 U/L — ABNORMAL LOW (ref 15–41)
Albumin: 1.8 g/dL — ABNORMAL LOW (ref 3.5–5.0)
Alkaline Phosphatase: 55 U/L (ref 38–126)
Anion gap: 6 (ref 5–15)
BUN: 11 mg/dL (ref 8–23)
CO2: 18 mmol/L — ABNORMAL LOW (ref 22–32)
Calcium: 5.8 mg/dL — CL (ref 8.9–10.3)
Chloride: 110 mmol/L (ref 98–111)
Creatinine, Ser: 0.71 mg/dL (ref 0.61–1.24)
GFR calc Af Amer: >60 mL/min
GFR calc non Af Amer: >60 mL/min
Glucose, Bld: 109 mg/dL — ABNORMAL HIGH (ref 70–99)
Potassium: 2.8 mmol/L — ABNORMAL LOW (ref 3.5–5.1)
Sodium: 134 mmol/L — ABNORMAL LOW (ref 135–145)
Total Bilirubin: 0.2 mg/dL — ABNORMAL LOW (ref 0.3–1.2)
Total Protein: 4.3 g/dL — ABNORMAL LOW (ref 6.5–8.1)

## 2019-11-03 LAB — POC OCCULT BLOOD, ED: Fecal Occult Bld: NEGATIVE

## 2019-11-03 LAB — CBG MONITORING, ED: Glucose-Capillary: 118 mg/dL — ABNORMAL HIGH (ref 70–99)

## 2019-11-03 LAB — TROPONIN I (HIGH SENSITIVITY)
Troponin I (High Sensitivity): 4 ng/L (ref <18)
Troponin I (High Sensitivity): 9 ng/L (ref <18)

## 2019-11-03 LAB — SARS CORONAVIRUS 2 BY RT PCR (HOSPITAL ORDER, PERFORMED IN ~~LOC~~ HOSPITAL LAB): SARS Coronavirus 2: NEGATIVE

## 2019-11-03 LAB — LACTIC ACID, PLASMA: Lactic Acid, Venous: 1.5 mmol/L (ref 0.5–1.9)

## 2019-11-03 MED ORDER — PREGABALIN 75 MG PO CAPS
150.0000 mg | ORAL_CAPSULE | Freq: Two times a day (BID) | ORAL | Status: DC
  Administered 2019-11-04 – 2019-11-05 (×4): 150 mg via ORAL
  Filled 2019-11-03 (×2): qty 2
  Filled 2019-11-03: qty 1
  Filled 2019-11-03: qty 2

## 2019-11-03 MED ORDER — SODIUM CHLORIDE 0.9 % IV BOLUS
1000.0000 mL | Freq: Once | INTRAVENOUS | Status: AC
  Administered 2019-11-03: 1000 mL via INTRAVENOUS

## 2019-11-03 MED ORDER — HEPARIN SODIUM (PORCINE) 5000 UNIT/ML IJ SOLN
5000.0000 [IU] | Freq: Three times a day (TID) | INTRAMUSCULAR | Status: DC
  Administered 2019-11-04 – 2019-11-05 (×4): 5000 [IU] via SUBCUTANEOUS
  Filled 2019-11-03 (×4): qty 1

## 2019-11-03 MED ORDER — PANTOPRAZOLE SODIUM 40 MG IV SOLR
40.0000 mg | Freq: Once | INTRAVENOUS | Status: AC
  Administered 2019-11-03: 40 mg via INTRAVENOUS
  Filled 2019-11-03: qty 40

## 2019-11-03 MED ORDER — ATORVASTATIN CALCIUM 80 MG PO TABS
80.0000 mg | ORAL_TABLET | Freq: Every day | ORAL | Status: DC
  Administered 2019-11-04 (×2): 80 mg via ORAL
  Filled 2019-11-03 (×2): qty 1

## 2019-11-03 MED ORDER — SODIUM CHLORIDE 0.9 % IV BOLUS
500.0000 mL | Freq: Once | INTRAVENOUS | Status: AC
  Administered 2019-11-03: 500 mL via INTRAVENOUS

## 2019-11-03 MED ORDER — SODIUM CHLORIDE 0.9% FLUSH: 3.0000 mL | Freq: Once | INTRAVENOUS | Status: DC

## 2019-11-03 MED ORDER — ONDANSETRON HCL 4 MG/2ML IJ SOLN: 4.0000 mg | Freq: Four times a day (QID) | INTRAMUSCULAR | Status: DC | PRN

## 2019-11-03 MED ORDER — ASPIRIN 81 MG PO CHEW
81.0000 mg | CHEWABLE_TABLET | Freq: Every day | ORAL | Status: DC
  Administered 2019-11-04 (×2): 81 mg via ORAL
  Filled 2019-11-03 (×2): qty 1

## 2019-11-03 MED ORDER — TAPENTADOL HCL 50 MG PO TABS
100.0000 mg | ORAL_TABLET | Freq: Two times a day (BID) | ORAL | Status: DC
  Administered 2019-11-04 – 2019-11-05 (×3): 100 mg via ORAL
  Filled 2019-11-03 (×3): qty 2

## 2019-11-03 MED ORDER — CALCIUM GLUCONATE-NACL 1-0.675 GM/50ML-% IV SOLN
1.0000 g | Freq: Once | INTRAVENOUS | Status: AC
  Administered 2019-11-03: 1000 mg via INTRAVENOUS
  Filled 2019-11-03: qty 50

## 2019-11-03 MED ORDER — LIDOCAINE 5 % EX PTCH
1.0000 | MEDICATED_PATCH | Freq: Every day | CUTANEOUS | Status: DC | PRN
  Filled 2019-11-03: qty 1

## 2019-11-03 MED ORDER — ONDANSETRON HCL 4 MG PO TABS: 4.0000 mg | ORAL_TABLET | Freq: Four times a day (QID) | ORAL | Status: DC | PRN

## 2019-11-03 MED ORDER — IOHEXOL 350 MG/ML SOLN
75.0000 mL | Freq: Once | INTRAVENOUS | Status: AC | PRN
  Administered 2019-11-03: 75 mL via INTRAVENOUS

## 2019-11-03 MED ORDER — INSULIN ASPART 100 UNIT/ML ~~LOC~~ SOLN
0.0000 [IU] | Freq: Three times a day (TID) | SUBCUTANEOUS | Status: DC
  Administered 2019-11-04: 2 [IU] via SUBCUTANEOUS
  Administered 2019-11-04: 1 [IU] via SUBCUTANEOUS
  Administered 2019-11-04: 3 [IU] via SUBCUTANEOUS
  Administered 2019-11-05: 1 [IU] via SUBCUTANEOUS
  Administered 2019-11-05: 2 [IU] via SUBCUTANEOUS

## 2019-11-03 MED ORDER — DULOXETINE HCL 60 MG PO CPEP
60.0000 mg | ORAL_CAPSULE | Freq: Every day | ORAL | Status: DC
  Administered 2019-11-04 – 2019-11-05 (×2): 60 mg via ORAL
  Filled 2019-11-03 (×2): qty 1

## 2019-11-03 MED ORDER — POTASSIUM CHLORIDE CRYS ER 20 MEQ PO TBCR
40.0000 meq | EXTENDED_RELEASE_TABLET | ORAL | Status: AC
  Administered 2019-11-04 (×2): 40 meq via ORAL
  Filled 2019-11-03 (×2): qty 2

## 2019-11-03 NOTE — H&P (Signed)
History and Physical    KRISTOFFER BALA HKV:[id number] DOB: [birth year] DOA: 11/03/2019  PCP: Unk Pinto, MD  Patient coming from: Home.  Chief Complaint: Weakness.  HPI: James Parsons is a 74 y.o. male with history of nonischemic cardiomyopathy last EF measured was 46 to 50% with history of diabetes mellitus type 2, hyperlipidemia COPD was brought to the ER the patient was feeling weak and dizzy since waking up this morning.  He was doing fine yesterday.  Denies any nausea vomiting diarrhea chest pain shortness of breath loss of consciousness.  Has had no change in his medications.  This morning when he woke up he felt dizzy and fell on the floor hit his head.  Did not lose consciousness.  ED Course: In the ER patient was hypotensive with blood pressure systolic in the 56E.  Had to be given 2 L fluid bolus following which blood pressure improved to the 90s I gave another 500 cc bolus now the blood pressure is around 332 systolic.  Labs are significant for sodium of 128 creatinine 1.1 which increased 1.7 about a week ago and WBC count of 14.3 hemoglobin 10.1 dropped from 11.1 recently high sensitive troponins were negative EKG shows normal sinus rhythm with LBBB Covid test was negative.  CT head was unremarkable CT angiogram of the chest shows right hilar mass concerning for bronchogenic carcinoma with possible lymphadenopathy.  Review of Systems: As per HPI, rest all negative.   Past Medical History:  Diagnosis Date  . Allergy   . Anemia   . Arthritis    right hand  . Borderline hypertension   . CHF (congestive heart failure) ([place])    pt denies  . Clausterphobic   . Clotting disorder (HCC)    bleeds easily-no dx  . COPD (chronic obstructive pulmonary disease) ([place])    denies  . Depression    hx of   . Diabetes mellitus    type 2  . Diverticulosis of colon   . Full dentures   . GERD (gastroesophageal reflux disease)    pt denoes  . Hx of colonic polyps   .  Hyperlipidemia   . Hypertension   . Lipoma   . Low back pain   . Memory loss   . Mental disorder   . Obesity   . Pneumonia    hx  . RSD (reflex sympathetic dystrophy)   . Venous insufficiency     Past Surgical History:  Procedure Laterality Date  . BACK SURGERY  2005,2007  . BALLOON DILATION N/A 06/06/2017   Procedure: BALLOON DILATION;  Surgeon: Irene Shipper, MD;  Location: [facility];  Service: Endoscopy;  Laterality: N/A;  . C3-4 anterior cervical discectomy and fusion with plating at c3-4  05/2006   Dr. Arnoldo Morale  . CARDIAC CATHETERIZATION N/A 04/25/2016   Procedure: Right/Left Heart Cath and Coronary Angiography;  Surgeon: Peter M Martinique, MD;  Location: [facility];  Service: Cardiovascular;  Laterality: N/A;  . COLONOSCOPY    . COLONOSCOPY W/ POLYPECTOMY    . ESOPHAGOGASTRODUODENOSCOPY (EGD) WITH PROPOFOL N/A 06/06/2017   Procedure: ESOPHAGOGASTRODUODENOSCOPY (EGD) WITH PROPOFOL;  Surgeon: Irene Shipper, MD;  Location: [facility];  Service: Endoscopy;  Laterality: N/A;  . excision of lipoma from right olecranon area  11/2000   Dr. Rise [name]  . KNEE ARTHROSCOPY     right knee  . KNEE ARTHROSCOPY Right 06/06/2014   Procedure: ARTHROSCOPY RIGHT KNEE WITH REMOVAL OF FIBROUS BANDS;  Surgeon: Kathalene Frames  Mayer Camel, MD;  Location: [facility];  Service: Orthopedics;  Laterality: Right;  . LUMBAR LAMINECTOMY/DECOMPRESSION MICRODISCECTOMY N/A 01/08/2014   Procedure: L4-S1 Decompression with removal and reimplantation of spinal cord stimulator battery ;  Surgeon: Melina Schools, MD;  Location: [facility];  Service: Orthopedics;  Laterality: N/A;  . microdiscectomy and decompression  05/2002   Dr. Tonita Cong  . morphine pump  2009   due to reaction to morphine/changed to fentanyl  . PAIN PUMP IMPLANTATION     with fentanyl  . spinal cord stimulator implanted     for pain per Dr. Maryruth Eve  . subcut pain pump implanted    . TONSILLECTOMY    . TOTAL KNEE ARTHROPLASTY  02/29/2012    Procedure: TOTAL KNEE ARTHROPLASTY;  Surgeon: Kerin Salen, MD;  Location: [facility];  Service: Orthopedics;  Laterality: Right;     reports that he quit smoking about 17 years ago. His smoking use included cigarettes. He has a 120.00 pack-year smoking history. He has never used smokeless tobacco. He reports that he does not drink alcohol and does not use drugs.  Allergies  Allergen Reactions  . Codeine Itching  . Morphine And Related Other (See Comments)    hallucinations    Family History  Problem Relation Age of Onset  . Lung cancer Father   . Other Mother        "died from old age" at 65  . Colon cancer Neg Hx   . Esophageal cancer Neg Hx   . Stomach cancer Neg Hx   . Rectal cancer Neg Hx     Prior to Admission medications   Medication Sig Start Date End Date Taking? Authorizing Provider  aspirin 81 MG chewable tablet Chew 81 mg by mouth at bedtime.    [provider]  atorvastatin (LIPITOR) 80 MG tablet TAKE 0.5 TABLETS (40 MG TOTAL) BY MOUTH DAILY AT 6 PM. Patient taking differently: Take 80 mg by mouth daily at 6 PM.  10/11/18   Liane Comber, NP  Cholecalciferol (VITAMIN D-3) 5000 UNITS TABS Take 5,000 Units by mouth 2 (two) times daily.    [provider]  Dulaglutide (TRULICITY) 1.5 XK/4.8JE SOPN Inject 1.5 mg into the skin once a week. For diabetes and weight loss. 06/17/19   Liane Comber, NP  DULoxetine (CYMBALTA) 60 MG capsule Take 60 mg by mouth daily.  10/09/12   [provider]  ENTRESTO 49-51 MG TAKE 1 TABLET BY MOUTH TWICE A DAY Patient taking differently: 0.5 tablets.  05/02/18   Larey Dresser, MD  glimepiride (AMARYL) 4 MG tablet TAKE 1 TABLET TWICE DAILY WITH MEALS. DO NOT TAKE IF YOU DO NOT EAT. 06/15/18   Unk Pinto, MD  hyoscyamine (LEVSIN) 0.125 MG tablet Take 1 to 2 tablets 2 to 3 x   /day ONLY if needed fot Nausea, Bloating, Cramping or Diarrhea 06/19/19   Unk Pinto, MD  lidocaine (LIDODERM) 5 % Place 1 patch onto the  skin daily as needed (pain). 04/26/16   Hongalgi, Lenis Dickinson, MD  metFORMIN (GLUCOPHAGE-XR) 500 MG 24 hr tablet Take 2 tablets 2 x /day with Meals for Diabetes 11/05/18   Unk Pinto, MD  pregabalin (LYRICA) 150 MG capsule Take 150 mg by mouth 2 (two) times daily.    [provider]  PRESCRIPTION MEDICATION by Intrathecal route continuous. Fentanyl 2mg /ml solution . Daily dose 121.1 mg Compounded at [facility] [phone] Pump is refilled every six months.    [provider]  spironolactone (ALDACTONE) 25 MG tablet Take 1 tablet (25 mg total) by mouth daily. Last refill without office visit, please call [phone] 05/27/19   Larey Dresser, MD  Tapentadol HCl (NUCYNTA) 100 MG TABS Take 100 mg by mouth See admin instructions. Take 100 mg by mouth twice daily, may also take 100 mg extra during the day as needed for pain    [provider]    Physical Exam: Constitutional: Moderately built and nourished. Vitals:   11/03/19 1930 11/03/19 1940 11/03/19 1945 11/03/19 2000  BP: (!) 69/56 92/69 (!) 96/64 (!) 89/48  Pulse: 80 93 78 75  Resp: 13 (!) 27 16 (!) 28  Temp:      TempSrc:      SpO2: 94% 90% 97% 99%   Eyes: Anicteric no pallor. ENMT: No discharge from the ears eyes nose or mouth. Neck: No mass or.  No neck rigidity. Respiratory: No rhonchi or crepitations. Cardiovascular: S1-S2 heard. Abdomen: Soft nontender bowel sound present. Musculoskeletal: No edema. Skin: No rash. Neurologic: Alert awake oriented to time place and person.  Moves all extremities. Psychiatric: Appears normal per normal affect.   Labs on Admission: I have personally reviewed following labs and imaging studies  CBC: Recent Labs  Lab 10/28/19 1507 11/03/19 1644  WBC 12.1* 14.3*  NEUTROABS 8,337*  --   HGB 11.7* 10.1*  HCT 37.5* 32.7*  MCV 83.3 83.6  PLT 556* 962*   Basic Metabolic Panel: Recent Labs  Lab 10/28/19 1507 11/03/19 1644  11/03/19 1950  NA 139 128* 134*  K 5.4* 4.5 2.8*  CL 98 93* 110  CO2 29 24 18*  GLUCOSE 198* 150* 109*  BUN 11 18 11   CREATININE 0.77 1.18 0.71  CALCIUM 10.1 8.7* 5.8*  MG 1.8  --   --    GFR: Estimated Creatinine Clearance: 84.4 mL/min (by C-G formula based on SCr of 0.71 mg/dL). Liver Function Tests: Recent Labs  Lab 10/28/19 1507 11/03/19 1950  AST 9* 10*  ALT 10 11  ALKPHOS  --  55  BILITOT 0.4 0.2*  PROT 7.0 4.3*  ALBUMIN  --  1.8*   No results for input(s): LIPASE, AMYLASE in the last 168 hours. No results for input(s): AMMONIA in the last 168 hours. Coagulation Profile: No results for input(s): INR, PROTIME in the last 168 hours. Cardiac Enzymes: No results for input(s): CKTOTAL, CKMB, CKMBINDEX, TROPONINI in the last 168 hours. BNP (last 3 results) No results for input(s): PROBNP in the last 8760 hours. HbA1C: No results for input(s): HGBA1C in the last 72 hours. CBG: No results for input(s): GLUCAP in the last 168 hours. Lipid Profile: No results for input(s): CHOL, HDL, LDLCALC, TRIG, CHOLHDL, LDLDIRECT in the last 72 hours. Thyroid Function Tests: No results for input(s): TSH, T4TOTAL, FREET4, T3FREE, THYROIDAB in the last 72 hours. Anemia Panel: No results for input(s): VITAMINB12, FOLATE, FERRITIN, TIBC, IRON, RETICCTPCT in the last 72 hours. Urine analysis:    Component Value Date/Time   COLORURINE YELLOW 10/28/2019 1507   APPEARANCEUR CLOUDY (A) 10/28/2019 1507   LABSPEC 1.011 10/28/2019 1507   PHURINE 6.5 10/28/2019 1507   GLUCOSEU NEGATIVE 10/28/2019 1507   HGBUR NEGATIVE 10/28/2019 1507   BILIRUBINUR NEGATIVE 04/29/2016 1634   KETONESUR NEGATIVE 10/28/2019 1507   PROTEINUR NEGATIVE 10/28/2019 1507   UROBILINOGEN 0.2 04/12/2014 1555   NITRITE NEGATIVE 10/28/2019 1507   LEUKOCYTESUR TRACE (A) 10/28/2019 1507   Sepsis Labs: @LABRCNTIP (procalcitonin:4,lacticidven:4) )No results found for this or  any previous visit (from the past 240  hour(s)).   Radiological Exams on Admission: CT Head Wo Contrast  Result Date: 11/03/2019 CLINICAL DATA:  Facial trauma, fall, head injury EXAM: CT HEAD WITHOUT CONTRAST TECHNIQUE: Contiguous axial images were obtained from the base of the skull through the vertex without intravenous contrast. COMPARISON:  08/05/2019 FINDINGS: Brain: Normal anatomic configuration. Mild parenchymal volume loss is present, commensurate with the patient's age and stable since prior examination. No abnormal intra or extra-axial mass lesion or fluid collection. No abnormal mass effect or midline shift. No evidence of acute intracranial hemorrhage or infarct. Ventricular size is normal. Cerebellum unremarkable. Vascular: Unremarkable Skull: Intact Sinuses/Orbits: Paranasal sinuses are clear. Orbits are unremarkable. Other: Mastoid air cells and middle ear cavities are clear. IMPRESSION: No acute intracranial injury.  No calvarial fracture. Electronically Signed   By: Fidela Salisbury MD   On: 11/03/2019 19:30   CT Angio Chest PE W and/or Wo Contrast  Result Date: 11/03/2019 CLINICAL DATA:  Chest pain, EXAM: CT ANGIOGRAPHY CHEST WITH CONTRAST TECHNIQUE: Multidetector CT imaging of the chest was performed using the standard protocol during bolus administration of intravenous contrast. Multiplanar CT image reconstructions and MIPs were obtained to evaluate the vascular anatomy. CONTRAST:  62mL OMNIPAQUE IOHEXOL 350 MG/ML SOLN COMPARISON:  None. FINDINGS: Cardiovascular: There is excellent opacification of the pulmonary arterial tree. There is no intraluminal filling defect to suggest acute pulmonary embolism. There is circumferential narrowing of several pulmonary arterial vessels, most notably the right middle lobe pulmonary artery and the segmental pulmonary artery to the superior segment of the right lower lobe, secondary to the right hilar mass. The right superior pulmonary vein is nearly obliterated peripherally. Moderate  multi-vessel coronary artery calcification. Global cardiac size within normal limits. No pericardial effusion. Thoracic aorta is age-appropriate with mild atherosclerotic calcification noted. Mediastinum/Nodes: A right hilar mass is present abutting the right mainstem bronchus and coming within 21 mm of the carina. This is best seen on coronal image # 75/10 the mass surrounds the right lower lobar middle lobar and upper lobar bronchi with narrowing of the structures. The mass measures 4.8 x 5.7 x 6.3 cm on axial image # 63/7 and sagittal image # 84/10. Pathologic right paratracheal adenopathy is present with a lymph node measuring 13 mm in short axis diameter at axial image # 40/7. Lungs/Pleura: There is right-sided volume loss. Ground-glass pulmonary infiltrate within the right upper lobe likely represents a combination of atelectasis and postobstructive pneumonitis. The left lung is clear. No pneumothorax. No pleural effusion. Upper Abdomen: Unremarkable Musculoskeletal: No lytic or blastic bone lesions are seen. Review of the MIP images confirms the above findings. 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 The mass extends to within 21 mm of the carina. Associated pathologic right paratracheal adenopathy. No pulmonary embolism. Moderate coronary artery calcification. Aortic Atherosclerosis (ICD10-I70.0). Electronically Signed   By: Fidela Salisbury MD   On: 11/03/2019 19:42   DG Chest Port 1 View  Result Date: 11/03/2019 CLINICAL DATA:  74 year old male with history of weakness and dizziness. EXAM: PORTABLE CHEST 1 VIEW COMPARISON:  Chest x-ray 08/29/2016. FINDINGS: Mass-like fullness in the right hilar region with some surrounding interstitial prominence. Left lung is clear. No pleural effusions. No evidence of pulmonary edema. No pneumothorax. Heart size is mildly  enlarged. IMPRESSION: 1. Mass-like appearance of the right hilar region concerning for potential malignancy. Further  evaluation with contrast enhanced chest CT is suggested to better evaluate this finding. 2. Mild cardiomegaly. Electronically Signed   By: Vinnie Langton M.D.   On: 11/03/2019 18:04    EKG: Independently reviewed.  Normal sinus rhythm LBBB.  Assessment/Plan Principal Problem:   Near syncope Active Problems:   Hyperlipidemia associated with type 2 diabetes mellitus (HCC)   Essential hypertension   COPD (chronic obstructive pulmonary disease) (HCC)   Poorly controlled type 2 diabetes mellitus (HCC)   Anemia   Spinal cord stimulator status   RSD (reflex sympathetic dystrophy)   Non-ischemic cardiomyopathy ([place])    1. Near syncope likely from hypotension.  The cause of hypertension is not clear patient denies taking any new medication did not have any nausea vomiting diarrhea or chest pain.  No blood in the stools.  Patient blood pressure is responding to fluids and has so far received 2500 cc.  Will hold off patient's Entresto spironolactone closely monitor in telemetry.  Patient does have mild leukocytosis but no signs of any infection. 2. Hyponatremia with acute renal failure and hyperkalemia -patient denies any vomiting or diarrhea.  Lab works show features concerning for dehydration.  Will hold off patient's spironolactone Entresto and already received fluids.  Follow metabolic panel.  3. Anemia worsening from previous recently.  But hemoglobin is remaining stable with repeat check.  Patient denies any blood in the stools.  Follow CBC. 4. Diabetes mellitus type 2 we will keep patient on sliding scale coverage. 5. Nonischemic cardiomyopathy presently receiving fluids holding Entresto spironolactone due to low blood pressure and renal failure. 6. 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  7. Chronic pain on Nucynta Lyrica Cymbalta also has history of spinal stimulator placement. 8. COPD not actively wheezing. 9. Hyperlipidemia on statins.   DVT prophylaxis: Heparin. Code Status: Full code. Family Communication: Patient's wife. Disposition Plan: Home. Consults called: Physical therapy. Admission status: Observation.   Rise [name] MD [facility] Pager [phone].  If 7PM-7AM, please contact night-coverage [email or web address] Password [facility]  11/03/2019, 8:43 PM

## 2019-11-03 NOTE — ED Notes (Signed)
Informed provider of critical ca

## 2019-11-03 NOTE — ED Triage Notes (Signed)
Reports dizziness and generalized weakness since this morning.  No arm drift.  VAN negative.  Denies numbness or unilateral weakness.  [address] and hit head today.  No blood thinners.  Pt hypotensive on arrival.

## 2019-11-03 NOTE — ED Notes (Signed)
Ordered hospital bed for pt 

## 2019-11-03 NOTE — ED Notes (Signed)
Culture sent with urine.

## 2019-11-03 NOTE — ED Provider Notes (Signed)
[facility] DE[facility]MENT Provider Note   CSN: [id number] Arrival date & time: 11/03/19  1622     History Chief Complaint  Patient presents with  . Dizziness  . Fall  . Hypotension    James Parsons is a 74 y.o. male.  74 year old male with prior medical history detailed below presents for evaluation of weakness, dizziness, and near syncope.  Patient was at home.  Per the patient and the patient's wife was at bedside patient experienced worsening weakness over the course of today.  Patient had a fall at home as he was ambulating in his hallway.  Patient apparently became too weak to stand.  He did fall and strike his head.  There was no LOC.  He denies neck pain.  He complains of generalized weakness after the fall.  Patient specifically denies chest pain or shortness of breath.  Patient denies associated fever, nausea, vomiting, abdominal pain, focal weakness, or other specific complaint.  He denies bloody stools or vomiting.  The history is provided by the patient and the spouse.  Dizziness Quality:  Lightheadedness Severity:  Mild Onset quality:  Gradual Duration:  1 day Timing:  Constant Progression:  Worsening Chronicity:  New Relieved by:  Nothing Worsened by:  Nothing Ineffective treatments:  None tried Fall       Past Medical History:  Diagnosis Date  . Allergy   . Anemia   . Arthritis    right hand  . Borderline hypertension   . CHF (congestive heart failure) ([place])    pt denies  . Clausterphobic   . Clotting disorder (HCC)    bleeds easily-no dx  . COPD (chronic obstructive pulmonary disease) ([place])    denies  . Depression    hx of   . Diabetes mellitus    type 2  . Diverticulosis of colon   . Full dentures   . GERD (gastroesophageal reflux disease)    pt denoes  . Hx of colonic polyps   . Hyperlipidemia   . Hypertension   . Lipoma   . Low back pain   . Memory loss   . Mental disorder   . Obesity   . Pneumonia     hx  . RSD (reflex sympathetic dystrophy)   . Venous insufficiency     Patient Active Problem List   Diagnosis Date Noted  . Near syncope 11/03/2019  . Hyponatremia   . Lung mass   . Memory loss 07/09/2019  . Chronic midline low back pain with right-sided sciatica 07/09/2019  . Memory changes 05/09/2019  . B12 deficiency 05/09/2019  . Incontinence of feces 05/09/2019  . History of COVID-19 04/17/2019  . CKD stage 2 due to type 2 diabetes mellitus ([place]) 02/20/2019  . Hypertensive heart disease with heart failure ([place]) 04/16/2018  . Dysphagia   . Esophageal stricture   . Gastroesophageal reflux disease   . Systolic CHF ([place]) 56/43/3295  . Atherosclerosis of aorta ([place]) 06/28/2016  . CAD in native artery 05/17/2016  . LBBB (left bundle branch block) 05/17/2016  . Non-ischemic cardiomyopathy ([place]) 04/29/2016  . Encounter for Medicare annual wellness exam 03/10/2015  . RSD (reflex sympathetic dystrophy) 04/07/2014  . Overweight (BMI 25.0-29.9) 04/07/2014  . Spinal cord stimulator status 03/31/2014  . Chronic pain associated with significant psychosocial dysfunction 10/24/2013  . Vitamin D deficiency 09/18/2013  . Medication management 09/18/2013  . Poorly controlled type 2 diabetes mellitus ([place]) 11/02/2012  . Anemia 11/02/2012  . Failed back syndrome  of lumbar spine 10/09/2012  . Spinal stenosis, multilevel 05/17/2012  . Cervical arthritis 05/17/2012  . DJD (degenerative joint disease) 04/24/2012  . Essential hypertension 03/20/2010  . COPD (chronic obstructive pulmonary disease) ([place]) 03/09/2009  . Venous (peripheral) insufficiency 09/12/2008  . Diverticulosis of large intestine 09/12/2008  . COLONIC POLYPS 02/22/2007  . Hyperlipidemia associated with type 2 diabetes mellitus ([place]) 02/22/2007  . LOW BACK PAIN SYNDROME 02/22/2007    Past Surgical History:  Procedure Laterality Date  . BACK SURGERY  2005,2007  . BALLOON DILATION N/A 06/06/2017   Procedure: BALLOON  DILATION;  Surgeon: Irene Shipper, MD;  Location: [facility];  Service: Endoscopy;  Laterality: N/A;  . C3-4 anterior cervical discectomy and fusion with plating at c3-4  05/2006   Dr. Arnoldo Morale  . CARDIAC CATHETERIZATION N/A 04/25/2016   Procedure: Right/Left Heart Cath and Coronary Angiography;  Surgeon: Letrell Attwood M Martinique, MD;  Location: [facility];  Service: Cardiovascular;  Laterality: N/A;  . COLONOSCOPY    . COLONOSCOPY W/ POLYPECTOMY    . ESOPHAGOGASTRODUODENOSCOPY (EGD) WITH PROPOFOL N/A 06/06/2017   Procedure: ESOPHAGOGASTRODUODENOSCOPY (EGD) WITH PROPOFOL;  Surgeon: Irene Shipper, MD;  Location: [facility];  Service: Endoscopy;  Laterality: N/A;  . excision of lipoma from right olecranon area  11/2000   Dr. Rise [name]  . KNEE ARTHROSCOPY     right knee  . KNEE ARTHROSCOPY Right 06/06/2014   Procedure: ARTHROSCOPY RIGHT KNEE WITH REMOVAL OF FIBROUS BANDS;  Surgeon: Kerin Salen, MD;  Location: [facility];  Service: Orthopedics;  Laterality: Right;  . LUMBAR LAMINECTOMY/DECOMPRESSION MICRODISCECTOMY N/A 01/08/2014   Procedure: L4-S1 Decompression with removal and reimplantation of spinal cord stimulator battery ;  Surgeon: Melina Schools, MD;  Location: [facility];  Service: Orthopedics;  Laterality: N/A;  . microdiscectomy and decompression  05/2002   Dr. Tonita Cong  . morphine pump  2009   due to reaction to morphine/changed to fentanyl  . PAIN PUMP IMPLANTATION     with fentanyl  . spinal cord stimulator implanted     for pain per Dr. Maryruth Eve  . subcut pain pump implanted    . TONSILLECTOMY    . TOTAL KNEE ARTHROPLASTY  02/29/2012   Procedure: TOTAL KNEE ARTHROPLASTY;  Surgeon: Kerin Salen, MD;  Location: [facility];  Service: Orthopedics;  Laterality: Right;       Family History  Problem Relation Age of Onset  . Lung cancer Father   . Other Mother        "died from old age" at 31  . Colon cancer Neg Hx   . Esophageal cancer Neg Hx   . Stomach cancer Neg Hx   .  Rectal cancer Neg Hx     Social History   Tobacco Use  . Smoking status: Former Smoker    Packs/day: 3.00    Years: 40.00    Pack years: 120.00    Types: Cigarettes    Quit date: 05/25/2002    Years since quitting: 17.4  . Smokeless tobacco: Never Used  Vaping Use  . Vaping Use: Never used  Substance Use Topics  . Alcohol use: No  . Drug use: No    Home Medications Prior to Admission medications   Medication Sig Start Date End Date Taking? Authorizing Provider  aspirin 81 MG chewable tablet Chew 81 mg by mouth at bedtime.    [provider]  atorvastatin (LIPITOR) 80 MG tablet TAKE 0.5 TABLETS (40 MG TOTAL) BY MOUTH DAILY AT  6 PM. Patient taking differently: Take 80 mg by mouth daily at 6 PM.  10/11/18   Liane Comber, NP  Cholecalciferol (VITAMIN D-3) 5000 UNITS TABS Take 5,000 Units by mouth 2 (two) times daily.    [provider]  Dulaglutide (TRULICITY) 1.5 EN/2.7PO SOPN Inject 1.5 mg into the skin once a week. For diabetes and weight loss. 06/17/19   Liane Comber, NP  DULoxetine (CYMBALTA) 60 MG capsule Take 60 mg by mouth daily.  10/09/12   [provider]  ENTRESTO 49-51 MG TAKE 1 TABLET BY MOUTH TWICE A DAY Patient taking differently: 0.5 tablets.  05/02/18   Larey Dresser, MD  glimepiride (AMARYL) 4 MG tablet TAKE 1 TABLET TWICE DAILY WITH MEALS. DO NOT TAKE IF YOU DO NOT EAT. 06/15/18   Unk Pinto, MD  hyoscyamine (LEVSIN) 0.125 MG tablet Take 1 to 2 tablets 2 to 3 x   /day ONLY if needed fot Nausea, Bloating, Cramping or Diarrhea 06/19/19   Unk Pinto, MD  lidocaine (LIDODERM) 5 % Place 1 patch onto the skin daily as needed (pain). 04/26/16   Hongalgi, Lenis Dickinson, MD  metFORMIN (GLUCOPHAGE-XR) 500 MG 24 hr tablet Take 2 tablets 2 x /day with Meals for Diabetes 11/05/18   Unk Pinto, MD  pregabalin (LYRICA) 150 MG capsule Take 150 mg by mouth 2 (two) times daily.    [provider]  PRESCRIPTION MEDICATION by Intrathecal  route continuous. Fentanyl 2mg /ml solution . Daily dose 121.1 mg Compounded at [facility] [phone] Pump is refilled every six months.    [provider]  spironolactone (ALDACTONE) 25 MG tablet Take 1 tablet (25 mg total) by mouth daily. Last refill without office visit, please call [phone] 05/27/19   Larey Dresser, MD  Tapentadol HCl (NUCYNTA) 100 MG TABS Take 100 mg by mouth See admin instructions. Take 100 mg by mouth twice daily, may also take 100 mg extra during the day as needed for pain    [provider]    Allergies    Codeine and Morphine and related  Review of Systems   Review of Systems  Neurological: Positive for dizziness.  All other systems reviewed and are negative.   Physical Exam Updated Vital Signs BP (!) 89/48   Pulse 75   Temp 98 F (36.7 C) (Oral)   Resp (!) 28   SpO2 99%   Physical Exam Vitals and nursing note reviewed.  Constitutional:      General: He is not in acute distress.    Appearance: Normal appearance. He is well-developed.  HENT:     Head: Normocephalic and atraumatic.  Eyes:     Conjunctiva/sclera: Conjunctivae normal.     Pupils: Pupils are equal, round, and reactive to light.  Cardiovascular:     Rate and Rhythm: Normal rate and regular rhythm.     Heart sounds: Normal heart sounds.  Pulmonary:     Effort: Pulmonary effort is normal. No respiratory distress.     Breath sounds: Normal breath sounds.  Abdominal:     General: There is no distension.     Palpations: Abdomen is soft.     Tenderness: There is no abdominal tenderness.  Genitourinary:    Comments: Rectal - no melena  Musculoskeletal:        General: No deformity. Normal range of motion.     Cervical back: Normal range of motion and neck supple.  Skin:    General: Skin is warm and  dry.  Neurological:     Mental Status: He is alert and oriented to person, place, and time.     ED Results / Procedures / Treatments    Labs (all labs ordered are listed, but only abnormal results are displayed) Labs Reviewed  BASIC METABOLIC PANEL - Abnormal; Notable for the following components:      Result Value   Sodium 128 (*)    Chloride 93 (*)    Glucose, Bld 150 (*)    Calcium 8.7 (*)    All other components within normal limits  CBC - Abnormal; Notable for the following components:   WBC 14.3 (*)    RBC 3.91 (*)    Hemoglobin 10.1 (*)    HCT 32.7 (*)    MCH 25.8 (*)    Platelets 403 (*)    All other components within normal limits  COMPREHENSIVE METABOLIC PANEL - Abnormal; Notable for the following components:   Sodium 134 (*)    Potassium 2.8 (*)    CO2 18 (*)    Glucose, Bld 109 (*)    Calcium 5.8 (*)    Total Protein 4.3 (*)    Albumin 1.8 (*)    AST 10 (*)    Total Bilirubin 0.2 (*)    All other components within normal limits  LACTIC ACID, PLASMA  URINALYSIS, ROUTINE W REFLEX MICROSCOPIC  LACTIC ACID, PLASMA  CBC  CREATININE, SERUM  CBG MONITORING, ED  POC OCCULT BLOOD, ED  TYPE AND SCREEN  TROPONIN I (HIGH SENSITIVITY)  TROPONIN I (HIGH SENSITIVITY)    EKG EKG Interpretation  Date/Time:  Sunday November 03 2019 16:38:27 EDT Ventricular Rate:  74 PR Interval:  190 QRS Duration: 148 QT Interval:  470 QTC Calculation: 521 R Axis:   48 Text Interpretation: Normal sinus rhythm Left bundle branch block Abnormal ECG Confirmed by Dene Gentry [phone]) on 11/03/2019 4:40:33 PM   Radiology CT Head Wo Contrast  Result Date: 11/03/2019 CLINICAL DATA:  Facial trauma, fall, head injury EXAM: CT HEAD WITHOUT CONTRAST TECHNIQUE: Contiguous axial images were obtained from the base of the skull through the vertex without intravenous contrast. COMPARISON:  08/05/2019 FINDINGS: Brain: Normal anatomic configuration. Mild parenchymal volume loss is present, commensurate with the patient's age and stable since prior examination. No abnormal intra or extra-axial mass lesion or fluid collection. No  abnormal mass effect or midline shift. No evidence of acute intracranial hemorrhage or infarct. Ventricular size is normal. Cerebellum unremarkable. Vascular: Unremarkable Skull: Intact Sinuses/Orbits: Paranasal sinuses are clear. Orbits are unremarkable. Other: Mastoid air cells and middle ear cavities are clear. IMPRESSION: No acute intracranial injury.  No calvarial fracture. Electronically Signed   By: Fidela Salisbury MD   On: 11/03/2019 19:30   CT Angio Chest PE W and/or Wo Contrast  Result Date: 11/03/2019 CLINICAL DATA:  Chest pain, EXAM: CT ANGIOGRAPHY CHEST WITH CONTRAST TECHNIQUE: Multidetector CT imaging of the chest was performed using the standard protocol during bolus administration of intravenous contrast. Multiplanar CT image reconstructions and MIPs were obtained to evaluate the vascular anatomy. CONTRAST:  43mL OMNIPAQUE IOHEXOL 350 MG/ML SOLN COMPARISON:  None. FINDINGS: Cardiovascular: There is excellent opacification of the pulmonary arterial tree. There is no intraluminal filling defect to suggest acute pulmonary embolism. There is circumferential narrowing of several pulmonary arterial vessels, most notably the right middle lobe pulmonary artery and the segmental pulmonary artery to the superior segment of the right lower lobe, secondary to the right hilar mass. The right superior pulmonary  vein is nearly obliterated peripherally. Moderate multi-vessel coronary artery calcification. Global cardiac size within normal limits. No pericardial effusion. Thoracic aorta is age-appropriate with mild atherosclerotic calcification noted. Mediastinum/Nodes: A right hilar mass is present abutting the right mainstem bronchus and coming within 21 mm of the carina. This is best seen on coronal image # 75/10 the mass surrounds the right lower lobar middle lobar and upper lobar bronchi with narrowing of the structures. The mass measures 4.8 x 5.7 x 6.3 cm on axial image # 63/7 and sagittal image # 84/10.  Pathologic right paratracheal adenopathy is present with a lymph node measuring 13 mm in short axis diameter at axial image # 40/7. Lungs/Pleura: There is right-sided volume loss. Ground-glass pulmonary infiltrate within the right upper lobe likely represents a combination of atelectasis and postobstructive pneumonitis. The left lung is clear. No pneumothorax. No pleural effusion. Upper Abdomen: Unremarkable Musculoskeletal: No lytic or blastic bone lesions are seen. Review of the MIP images confirms the above findings. 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 The mass extends to within 21 mm of the carina. Associated pathologic right paratracheal adenopathy. No pulmonary embolism. Moderate coronary artery calcification. Aortic Atherosclerosis (ICD10-I70.0). Electronically Signed   By: Fidela Salisbury MD   On: 11/03/2019 19:42   DG Chest Port 1 View  Result Date: 11/03/2019 CLINICAL DATA:  74 year old male with history of weakness and dizziness. EXAM: PORTABLE CHEST 1 VIEW COMPARISON:  Chest x-ray 08/29/2016. FINDINGS: Mass-like fullness in the right hilar region with some surrounding interstitial prominence. Left lung is clear. No pleural effusions. No evidence of pulmonary edema. No pneumothorax. Heart size is mildly enlarged. IMPRESSION: 1. Mass-like appearance of the right hilar region concerning for potential malignancy. Further evaluation with contrast enhanced chest CT is suggested to better evaluate this finding. 2. Mild cardiomegaly. Electronically Signed   By: Vinnie Langton M.D.   On: 11/03/2019 18:04    Procedures Procedures (including critical care time)  Medications Ordered in ED Medications  sodium chloride flush (NS) 0.9 % injection 3 mL (3 mLs Intravenous Not Given 11/03/19 1705)  aspirin chewable tablet 81 mg (has no administration in time  range)  Tapentadol HCl TABS 100 mg (has no administration in time range)  atorvastatin (LIPITOR) tablet 80 mg (has no administration in time range)  DULoxetine (CYMBALTA) DR capsule 60 mg (has no administration in time range)  pregabalin (LYRICA) capsule 150 mg (has no administration in time range)  lidocaine (LIDODERM) 5 % 1 patch (has no administration in time range)  ondansetron (ZOFRAN) tablet 4 mg (has no administration in time range)    Or  ondansetron (ZOFRAN) injection 4 mg (has no administration in time range)  insulin aspart (novoLOG) injection 0-9 Units (has no administration in time range)  heparin injection 5,000 Units (has no administration in time range)  calcium gluconate 1 g/ 50 mL sodium chloride IVPB (has no administration in time range)  sodium chloride 0.9 % bolus 1,000 mL (0 mLs Intravenous Stopped 11/03/19 1832)  pantoprazole (PROTONIX) injection 40 mg (40 mg Intravenous Given 11/03/19 1844)  sodium chloride 0.9 % bolus 1,000 mL (0 mLs Intravenous Stopped 11/03/19 1951)  iohexol (OMNIPAQUE) 350 MG/ML injection 75 mL (75 mLs Intravenous Contrast Given 11/03/19 1903)    ED Course  I have reviewed the triage vital signs and the nursing notes.  Pertinent labs & imaging results that were available  during my care of the patient were reviewed by me and considered in my medical decision making (see chart for details).    MDM Rules/Calculators/A&P                          MDM  Screen complete  BAWI LAKINS was evaluated in Emergency Department on 11/03/2019 for the symptoms described in the history of present illness. He was evaluated in the context of the global COVID-19 pandemic, which necessitated consideration that the patient might be at risk for infection with the SARS-CoV-2 virus that causes COVID-19. Institutional protocols and algorithms that pertain to the evaluation of patients at risk for COVID-19 are in a state of rapid change based on information released by  regulatory bodies including the CDC and federal and state organizations. These policies and algorithms were followed during the patient's care in the ED.   Patient presenting for evaluation of weakness, fatigue, near syncope.  Patient's initial presentation is concerning for hypotension.  This improved with IV fluid administration.  Patient was also found to be hyponatremic.  Work-up also revealed presence of new right sided lung mass.  Given the patient's presentation and findings patient would benefit from admission.  Hospitalist Service is aware case will evaluate for same.   Final Clinical Impression(s) / ED Diagnoses   Final diagnoses:  Dizziness  Fall, initial encounter  Hyponatremia  Lung mass    Rx / DC Orders ED Discharge Orders    None       Valarie Merino, MD 11/03/19 2110

## 2019-11-04 DIAGNOSIS — E875 Hyperkalemia: Secondary | ICD-10-CM | POA: Diagnosis present

## 2019-11-04 DIAGNOSIS — D631 Anemia in chronic kidney disease: Secondary | ICD-10-CM | POA: Diagnosis present

## 2019-11-04 DIAGNOSIS — Z7982 Long term (current) use of aspirin: Secondary | ICD-10-CM | POA: Diagnosis not present

## 2019-11-04 DIAGNOSIS — Z794 Long term (current) use of insulin: Secondary | ICD-10-CM | POA: Diagnosis not present

## 2019-11-04 DIAGNOSIS — Z20822 Contact with and (suspected) exposure to covid-19: Secondary | ICD-10-CM | POA: Diagnosis present

## 2019-11-04 DIAGNOSIS — R42 Dizziness and giddiness: Secondary | ICD-10-CM

## 2019-11-04 DIAGNOSIS — Z79899 Other long term (current) drug therapy: Secondary | ICD-10-CM | POA: Diagnosis not present

## 2019-11-04 DIAGNOSIS — I428 Other cardiomyopathies: Secondary | ICD-10-CM | POA: Diagnosis present

## 2019-11-04 DIAGNOSIS — R55 Syncope and collapse: Secondary | ICD-10-CM

## 2019-11-04 DIAGNOSIS — I959 Hypotension, unspecified: Secondary | ICD-10-CM | POA: Diagnosis present

## 2019-11-04 DIAGNOSIS — Z885 Allergy status to narcotic agent status: Secondary | ICD-10-CM | POA: Diagnosis not present

## 2019-11-04 DIAGNOSIS — N182 Chronic kidney disease, stage 2 (mild): Secondary | ICD-10-CM | POA: Diagnosis present

## 2019-11-04 DIAGNOSIS — Z87891 Personal history of nicotine dependence: Secondary | ICD-10-CM | POA: Diagnosis not present

## 2019-11-04 DIAGNOSIS — E876 Hypokalemia: Secondary | ICD-10-CM | POA: Diagnosis present

## 2019-11-04 DIAGNOSIS — N179 Acute kidney failure, unspecified: Secondary | ICD-10-CM | POA: Diagnosis present

## 2019-11-04 DIAGNOSIS — G894 Chronic pain syndrome: Secondary | ICD-10-CM | POA: Diagnosis present

## 2019-11-04 DIAGNOSIS — G9050 Complex regional pain syndrome I, unspecified: Secondary | ICD-10-CM | POA: Diagnosis present

## 2019-11-04 DIAGNOSIS — E785 Hyperlipidemia, unspecified: Secondary | ICD-10-CM | POA: Diagnosis present

## 2019-11-04 DIAGNOSIS — E871 Hypo-osmolality and hyponatremia: Secondary | ICD-10-CM | POA: Diagnosis present

## 2019-11-04 DIAGNOSIS — I129 Hypertensive chronic kidney disease with stage 1 through stage 4 chronic kidney disease, or unspecified chronic kidney disease: Secondary | ICD-10-CM | POA: Diagnosis present

## 2019-11-04 DIAGNOSIS — Z96651 Presence of right artificial knee joint: Secondary | ICD-10-CM | POA: Diagnosis present

## 2019-11-04 DIAGNOSIS — K219 Gastro-esophageal reflux disease without esophagitis: Secondary | ICD-10-CM | POA: Diagnosis present

## 2019-11-04 DIAGNOSIS — K5730 Diverticulosis of large intestine without perforation or abscess without bleeding: Secondary | ICD-10-CM | POA: Diagnosis present

## 2019-11-04 DIAGNOSIS — E1122 Type 2 diabetes mellitus with diabetic chronic kidney disease: Secondary | ICD-10-CM | POA: Diagnosis present

## 2019-11-04 DIAGNOSIS — J449 Chronic obstructive pulmonary disease, unspecified: Secondary | ICD-10-CM | POA: Diagnosis present

## 2019-11-04 DIAGNOSIS — Z801 Family history of malignant neoplasm of trachea, bronchus and lung: Secondary | ICD-10-CM | POA: Diagnosis not present

## 2019-11-04 LAB — BASIC METABOLIC PANEL
Anion gap: 10 (ref 5–15)
BUN: 9 mg/dL (ref 8–23)
CO2: 23 mmol/L (ref 22–32)
Calcium: 8.7 mg/dL — ABNORMAL LOW (ref 8.9–10.3)
Chloride: 101 mmol/L (ref 98–111)
Creatinine, Ser: 0.77 mg/dL (ref 0.61–1.24)
GFR calc Af Amer: >60 mL/min (ref >60)
GFR calc non Af Amer: >60 mL/min (ref >60)
Glucose, Bld: 146 mg/dL — ABNORMAL HIGH (ref 70–99)
Potassium: 4.5 mmol/L (ref 3.5–5.1)
Sodium: 134 mmol/L — ABNORMAL LOW (ref 135–145)

## 2019-11-04 LAB — TROPONIN I (HIGH SENSITIVITY): Troponin I (High Sensitivity): 9 ng/L (ref <18)

## 2019-11-04 LAB — CBG MONITORING, ED
Glucose-Capillary: 139 mg/dL — ABNORMAL HIGH (ref 70–99)
Glucose-Capillary: 205 mg/dL — ABNORMAL HIGH (ref 70–99)
Glucose-Capillary: 173 mg/dL — ABNORMAL HIGH (ref 70–99)

## 2019-11-04 LAB — GLUCOSE, CAPILLARY: Glucose-Capillary: 162 mg/dL — ABNORMAL HIGH (ref 70–99)

## 2019-11-04 MED ORDER — SODIUM CHLORIDE 0.9 % IV SOLN: INTRAVENOUS | Status: DC

## 2019-11-04 NOTE — ED Notes (Signed)
Lunch Tray Ordered @ 1027. °

## 2019-11-04 NOTE — Progress Notes (Signed)
PROGRESS NOTE    James Parsons  AYT:[id number] DOB: [birth year] DOA: 11/03/2019 PCP: Unk Pinto, MD   Brief Narrative:  Patient is a 74 year old male with history of nonischemic cardiomyopathy, diabetes mellitus, hyperlipidemia, COPD who presented to the emergency room with complaints of weakness, dizzy and near syncopal.  On presentation ,he was hypotensive and was given IV fluids.  He was also found to be hyponatremic, hypokalemic, had AKI.  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  Physical therapy evaluation requested.  Blood pressure still soft today.  Assessment & Plan:   Principal Problem:   Near syncope Active Problems:   Hyperlipidemia associated with type 2 diabetes mellitus (HCC)   Essential hypertension   COPD (chronic obstructive pulmonary disease) (HCC)   Poorly controlled type 2 diabetes mellitus (HCC)   Anemia   Spinal cord stimulator status   RSD (reflex sympathetic dystrophy)   Non-ischemic cardiomyopathy ([place])   Near syncope: Presented with dizziness, weakness.  Blood pressure was soft on presentation.  CT head did not show any acute intracranial abnormalities.  PT has been consulted.  Will check orthostatics  Hypotension: As per the wife, his blood pressure remains chronically low.  Continue gentle IV fluids.  Monitor on telemetry.  He is on Entresto, spironolactone at home which are on hold.  Hyponatremia/hypokalemia: Improved.  Normocytic anemia: Currently stable  Diabetes type 2: Continue sliding scale insulin.  Monitor blood sugars  Nonischemic cardiomyopathy: [place], spironolactone at home.  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  Patient and his wife are aware about this finding and they want to initiate the work-up in [place].  Chronic pain syndrome: He has a spinal stimulator.  Takes  Nucynta, Lyrica, Cymbalta.  Also takes fentanyl chronically by intrathecal route  COPD: Currently stable.  Not on exacerbation  Hyperlipidemia: On statin           DVT prophylaxis: Heparin [place] Code Status: Full Family Communication: Wife at bedside Status is: Observation  The patient remains OBS appropriate and will d/c before 2 midnights.  Dispo: The patient is from: Home              Anticipated d/c is to: Home              Anticipated d/c date is: 1 day              Patient currently is not medically stable to d/c. Hypotensive. Needs IV fluids   Consultants: None  Procedures:None  Antimicrobials:  Anti-infectives (From admission, onward)   None      Subjective: Patient seen and examined at the bedside this morning.  Lying on the bed, weak.  Blood pressure was soft.  Denies any specific complaints except for back pain  Objective: Vitals:   11/04/19 0531 11/04/19 0600 11/04/19 0900 11/04/19 1100  BP:  126/68 108/75 90/72  Pulse: 95 90 97 95  Resp: 15 16 21 20   Temp:      TempSrc:      SpO2: 96% 93% 90% 90%    Intake/Output Summary (Last 24 hours) at 11/04/2019 1251 Last data filed at 11/04/2019 0545 Gross per 24 hour  Intake 2500 ml  Output 1650 ml  Net 850 ml   There were no vitals filed for this visit.  Examination:  General exam: Chronically ill looking, weak HEENT:PERRL,Oral mucosa moist, Ear/Nose normal on gross exam Respiratory system: Bilateral equal  air entry, normal vesicular breath sounds, no wheezes or crackles  Cardiovascular system: S1 & S2 heard, RRR. No JVD, murmurs, rubs, gallops or clicks. No pedal edema. Gastrointestinal system: Abdomen is nondistended, soft and nontender. No organomegaly or masses felt. Normal bowel sounds heard. Central nervous system: Alert and oriented. No focal neurological deficits. Extremities: No edema, no clubbing ,no cyanosis Skin: No rashes, lesions or ulcers,no icterus ,no pallor  Data Reviewed: I have  personally reviewed following labs and imaging studies  CBC: Recent Labs  Lab 10/28/19 1507 11/03/19 1644 11/03/19 2117  WBC 12.1* 14.3* 11.8*  NEUTROABS 8,337*  --   --   HGB 11.7* 10.1* 10.2*  HCT 37.5* 32.7* 33.7*  MCV 83.3 83.6 84.9  PLT 556* 403* 601   Basic Metabolic Panel: Recent Labs  Lab 10/28/19 1507 11/03/19 1644 11/03/19 1950 11/03/19 2117 11/04/19 0856  NA 139 128* 134*  --  134*  K 5.4* 4.5 2.8*  --  4.5  CL 98 93* 110  --  101  CO2 29 24 18*  --  23  GLUCOSE 198* 150* 109*  --  146*  BUN 11 18 11   --  9  CREATININE 0.77 1.18 0.71 0.93 0.77  CALCIUM 10.1 8.7* 5.8*  --  8.7*  MG 1.8  --   --   --   --    GFR: Estimated Creatinine Clearance: 84.4 mL/min (by C-G formula based on SCr of 0.77 mg/dL). Liver Function Tests: Recent Labs  Lab 10/28/19 1507 11/03/19 1950  AST 9* 10*  ALT 10 11  ALKPHOS  --  55  BILITOT 0.4 0.2*  PROT 7.0 4.3*  ALBUMIN  --  1.8*   No results for input(s): LIPASE, AMYLASE in the last 168 hours. No results for input(s): AMMONIA in the last 168 hours. Coagulation Profile: No results for input(s): INR, PROTIME in the last 168 hours. Cardiac Enzymes: No results for input(s): CKTOTAL, CKMB, CKMBINDEX, TROPONINI in the last 168 hours. BNP (last 3 results) No results for input(s): PROBNP in the last 8760 hours. HbA1C: No results for input(s): HGBA1C in the last 72 hours. CBG: Recent Labs  Lab 11/03/19 2156 11/04/19 0841 11/04/19 1209  GLUCAP 118* 139* 205*   Lipid Profile: No results for input(s): CHOL, HDL, LDLCALC, TRIG, CHOLHDL, LDLDIRECT in the last 72 hours. Thyroid Function Tests: No results for input(s): TSH, T4TOTAL, FREET4, T3FREE, THYROIDAB in the last 72 hours. Anemia Panel: No results for input(s): VITAMINB12, FOLATE, FERRITIN, TIBC, IRON, RETICCTPCT in the last 72 hours. Sepsis Labs: Recent Labs  Lab 11/03/19 1950  LATICACIDVEN 1.5    Recent Results (from the past 240 hour(s))  SARS Coronavirus 2  by RT PCR (hospital order, performed in [facility] hospital lab) Nasopharyngeal Nasopharyngeal Swab     Status: None   Collection Time: 11/03/19  9:34 PM   Specimen: Nasopharyngeal Swab  Result Value Ref Range Status   SARS Coronavirus 2 NEGATIVE NEGATIVE Final    Comment: (NOTE) SARS-CoV-2 target nucleic acids are NOT DETECTED.  The SARS-CoV-2 RNA is generally detectable in upper and lower respiratory specimens during the acute phase of infection. The lowest concentration of SARS-CoV-2 viral copies this assay can detect is 250 copies / mL. A negative result does not preclude SARS-CoV-2 infection and should not be used as the sole basis for treatment or other patient management decisions.  A negative result may occur with improper specimen collection / handling, submission of specimen other than nasopharyngeal swab, presence of  viral mutation(s) within the areas targeted by this assay, and inadequate number of viral copies (<250 copies / mL). A negative result must be combined with clinical observations, patient history, and epidemiological information.  Fact Sheet for Patients:   StrictlyIdeas.no  Fact Sheet for Healthcare Providers: BankingDealers.co.za  This test is not yet approved or  cleared by the [place] FDA and has been authorized for detection and/or diagnosis of SARS-CoV-2 by FDA under an Emergency Use Authorization (EUA).  This EUA will remain in effect (meaning this test can be used) for the duration of the COVID-19 declaration under Section 564(b)(1) of the Act, 21 U.S.C. section 360bbb-3(b)(1), unless the authorization is terminated or revoked sooner.  Performed at [facility], [place] [address] [place], [place] [address]          Radiology Studies: CT Head Wo Contrast  Result Date: 11/03/2019 CLINICAL DATA:  Facial trauma, fall, head injury EXAM: CT HEAD WITHOUT CONTRAST TECHNIQUE: Contiguous axial  images were obtained from the base of the skull through the vertex without intravenous contrast. COMPARISON:  08/05/2019 FINDINGS: Brain: Normal anatomic configuration. Mild parenchymal volume loss is present, commensurate with the patient's age and stable since prior examination. No abnormal intra or extra-axial mass lesion or fluid collection. No abnormal mass effect or midline shift. No evidence of acute intracranial hemorrhage or infarct. Ventricular size is normal. Cerebellum unremarkable. Vascular: Unremarkable Skull: Intact Sinuses/Orbits: Paranasal sinuses are clear. Orbits are unremarkable. Other: Mastoid air cells and middle ear cavities are clear. IMPRESSION: No acute intracranial injury.  No calvarial fracture. Electronically Signed   By: Fidela Salisbury MD   On: 11/03/2019 19:30   CT Angio Chest PE W and/or Wo Contrast  Result Date: 11/03/2019 CLINICAL DATA:  Chest pain, EXAM: CT ANGIOGRAPHY CHEST WITH CONTRAST TECHNIQUE: Multidetector CT imaging of the chest was performed using the standard protocol during bolus administration of intravenous contrast. Multiplanar CT image reconstructions and MIPs were obtained to evaluate the vascular anatomy. CONTRAST:  50mL OMNIPAQUE IOHEXOL 350 MG/ML SOLN COMPARISON:  None. FINDINGS: Cardiovascular: There is excellent opacification of the pulmonary arterial tree. There is no intraluminal filling defect to suggest acute pulmonary embolism. There is circumferential narrowing of several pulmonary arterial vessels, most notably the right middle lobe pulmonary artery and the segmental pulmonary artery to the superior segment of the right lower lobe, secondary to the right hilar mass. The right superior pulmonary vein is nearly obliterated peripherally. Moderate multi-vessel coronary artery calcification. Global cardiac size within normal limits. No pericardial effusion. Thoracic aorta is age-appropriate with mild atherosclerotic calcification noted. Mediastinum/Nodes:  A right hilar mass is present abutting the right mainstem bronchus and coming within 21 mm of the carina. This is best seen on coronal image # 75/10 the mass surrounds the right lower lobar middle lobar and upper lobar bronchi with narrowing of the structures. The mass measures 4.8 x 5.7 x 6.3 cm on axial image # 63/7 and sagittal image # 84/10. Pathologic right paratracheal adenopathy is present with a lymph node measuring 13 mm in short axis diameter at axial image # 40/7. Lungs/Pleura: There is right-sided volume loss. Ground-glass pulmonary infiltrate within the right upper lobe likely represents a combination of atelectasis and postobstructive pneumonitis. The left lung is clear. No pneumothorax. No pleural effusion. Upper Abdomen: Unremarkable Musculoskeletal: No lytic or blastic bone lesions are seen. Review of the MIP images confirms the above findings. 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 The mass extends to within 21 mm of the carina. Associated pathologic right paratracheal adenopathy. No pulmonary embolism. Moderate coronary artery calcification. Aortic Atherosclerosis (ICD10-I70.0). Electronically Signed   By: Fidela Salisbury MD   On: 11/03/2019 19:42   DG Chest Port 1 View  Result Date: 11/03/2019 CLINICAL DATA:  74 year old male with history of weakness and dizziness. EXAM: PORTABLE CHEST 1 VIEW COMPARISON:  Chest x-ray 08/29/2016. FINDINGS: Mass-like fullness in the right hilar region with some surrounding interstitial prominence. Left lung is clear. No pleural effusions. No evidence of pulmonary edema. No pneumothorax. Heart size is mildly enlarged. IMPRESSION: 1. Mass-like appearance of the right hilar region concerning for potential malignancy. Further evaluation with contrast enhanced chest CT is suggested to better evaluate this finding. 2. Mild  cardiomegaly. Electronically Signed   By: Vinnie Langton M.D.   On: 11/03/2019 18:04        Scheduled Meds: . aspirin  81 mg Oral QHS  . atorvastatin  80 mg Oral q1800  . DULoxetine  60 mg Oral Daily  . heparin  5,000 Units Subcutaneous Q8H  . insulin aspart  0-9 Units Subcutaneous TID WC  . pregabalin  150 mg Oral BID  . sodium chloride flush  3 mL Intravenous Once  . tapentadol  100 mg Oral BID   Continuous Infusions:   LOS: 0 days    Time spent: 35 mins,More than 50% of that time was spent in counseling and/or coordination of care.      Shelly Coss, MD [facility] P7/26/2021, 12:51 PM

## 2019-11-04 NOTE — ED Notes (Addendum)
Pt informed he was moving upstairs.  Pt feels hot to touch and is sweaty (under 4 blankets).  Temp taken - no fever.  Two blankets removed, pt agreeable

## 2019-11-04 NOTE — ED Notes (Signed)
PT at bedside.

## 2019-11-04 NOTE — ED Notes (Signed)
Pt. Noted exiting bed multiple times. Currently High fall risk. Bed exit alarm on. Pt visible for nurses station.

## 2019-11-04 NOTE — ED Notes (Addendum)
Pt bed alarm sounded and then NT went in the room to find pt walking around the bed. When this NT began to direct him back to his hospital bed, he stated, "You don't understand, I have to find a bathroom!" After sitting the pt back in his bed, this NT reminded pt that his urinal has been on the rail of his bed. To this, pt responded "Right..right.. You told me that, you're right" and restated his forgetfulness. This NT reattached pt to monitor, reminded him to use his call bell, and reminded him of the urinal on the bed rail. Pt acknowledged these facts. This NT ensured that the hospital bed's alarm was turned on before exiting the room.

## 2019-11-04 NOTE — ED Notes (Addendum)
This NT entered pt room to find pt walking around, saying "I need to find the men's room." This NT directed pt back to his hospital bed and reminded him that he was wearing a device to automatically catch his urine. Pt said, "Oh yeah.." and then stated his forgetfulness. Before this NT entered the exam room, pt had ripped off the male purewick and all of his monitor cords (BP, cardiac leads, pulse ox). While this NT was re-plugging pt to the monitor, pt again asked about standing and using the bathroom. This NT reminded the pt of the importance in not standing and walking, and handed the pt a urinal to use in bed. Again, pt stated his forgetfulness. Pt continued to display confusion about not leaving the hospital bed (repeating the need to find a bathroom and urinate) within 3 minutes of this NTs repeated explanations about not leaving the hospital bed and the urinal placed beside him. Before exiting the room, this NT turned on the bed alarm of pt's hospital bed and used feed-back teaching to ensure that pt understood to not exit the bed, to use the urinal placed beside him on the bed rail, and to use his call bell if he needs assistance. Hospital socks with rubber grips placed on pt's feet.

## 2019-11-04 NOTE — ED Notes (Signed)
Pt transferred to hospital bed for comfort.

## 2019-11-04 NOTE — Evaluation (Signed)
Physical Therapy Evaluation Patient Details Name: James Parsons MRN: [id number] DOB: 02/25/1946 Today's Date: 11/04/2019   History of Present Illness  Pt is a 6 male admitted secondary to near syncopal episode. Pt found to have hypotension. PMH includes DM, HTN, R TKA, COPD, nonischemic cardiomyopathy and memory loss.   Clinical Impression  Pt admitted secondary to problem above. Pt's BP soft throughout when testing orthostatics (see vitals flowsheet) and reporting dizziness which limited mobility tolerance. Pt also demonstrating R lateral lean with prolonged standing. Pt requiring min to mod A for mobility tasks. Anticipate pt will progress well once symptoms improve, however, if does not improve, may need to consider SNF level therapies. Will continue to follow acutely to maximize functional mobility independence and safety.     Follow Up Recommendations Home health PT;Supervision/Assistance - 24 hour (pending mobility progression )    Equipment Recommendations  None recommended by PT    Recommendations for Other Services       Precautions / Restrictions Precautions Precautions: Fall;Other (comment) Precaution Comments: Watch BP  Restrictions Weight Bearing Restrictions: No      Mobility  Bed Mobility Overal bed mobility: Needs Assistance Bed Mobility: Supine to Sit;Sit to Supine     Supine to sit: Supervision Sit to supine: Supervision   General bed mobility comments: Supervision for safety. Increased time required to perform bed mobility.   Transfers Overall transfer level: Needs assistance Equipment used: 1 person hand held assist Transfers: Sit to/from Stand Sit to Stand: Min assist;Mod assist         General transfer comment: Min A initially for lift assist and steadying. Upon standing, pt reporting increased dizziness and with R lateral lean. Required mod A to maintain balance and return to sitting. Further mobility deferred.   Ambulation/Gait                 Stairs            Wheelchair Mobility    Modified Rankin (Stroke Patients Only)       Balance Overall balance assessment: Needs assistance Sitting-balance support: No upper extremity supported;Feet supported Sitting balance-Leahy Scale: Fair     Standing balance support: No upper extremity supported;During functional activity Standing balance-Leahy Scale: Poor Standing balance comment: Reliant on external support. R lateral lean with prolonged standing.                              Pertinent Vitals/Pain Pain Assessment: Faces Faces Pain Scale: Hurts little more Pain Location: back Pain Descriptors / Indicators: Aching;Operative site guarding Pain Intervention(s): Limited activity within patient's tolerance;Monitored during session;Repositioned    Home Living Family/patient expects to be discharged to:: Private residence Living Arrangements: Spouse/significant other Available Help at Discharge: Family;Available 24 hours/day Type of Home: House Home Access: Stairs to enter;Level entry (no steps in [place] residence) Entrance Stairs-Rails: None Technical brewer of Steps: 2-3 (in [employer] home) Home Layout: One level Home Equipment: Cane - single point;Shower seat;Walker - 2 wheels;Walker - 4 wheels      Prior Function Level of Independence: Independent with assistive device(s)         Comments: Uses cane for ambulation      Hand Dominance        Extremity/Trunk Assessment   Upper Extremity Assessment Upper Extremity Assessment: Generalized weakness    Lower Extremity Assessment Lower Extremity Assessment: Generalized weakness    Cervical / Trunk Assessment Cervical / Trunk Assessment: Kyphotic  Communication   Communication: No difficulties  Cognition Arousal/Alertness: Awake/alert Behavior During Therapy: WFL for tasks assessed/performed Overall Cognitive Status: History of cognitive impairments - at baseline                                         General Comments General comments (skin integrity, edema, etc.): BP soft when checking orthostatics. See vitals flowsheet.     Exercises     Assessment/Plan    PT Assessment Patient needs continued PT services  PT Problem List Decreased strength;Cardiopulmonary status limiting activity;Decreased mobility;Decreased balance;Decreased activity tolerance;Decreased cognition;Decreased knowledge of use of DME;Decreased knowledge of precautions;Decreased safety awareness       PT Treatment Interventions Gait training;DME instruction;Therapeutic activities;Functional mobility training;Therapeutic exercise;Balance training;Patient/family education;Cognitive remediation    PT Goals (Current goals can be found in the Care Plan section)  Acute Rehab PT Goals Patient Stated Goal: to feel better PT Goal Formulation: With patient Time For Goal Achievement: 11/18/19 Potential to Achieve Goals: Good    Frequency Min 3X/week   Barriers to discharge        Co-evaluation               AM-PAC PT "6 Clicks" Mobility  Outcome Measure Help needed turning from your back to your side while in a flat bed without using bedrails?: None Help needed moving from lying on your back to sitting on the side of a flat bed without using bedrails?: A Little Help needed moving to and from a bed to a chair (including a wheelchair)?: A Little Help needed standing up from a chair using your arms (e.g., wheelchair or bedside chair)?: A Little Help needed to walk in hospital room?: A Lot Help needed climbing 3-5 steps with a railing? : Total 6 Click Score: 16    End of Session Equipment Utilized During Treatment: Gait belt Activity Tolerance: Treatment limited secondary to medical complications (Comment) (soft BP and dizziness) Patient left: in bed;with call bell/phone within reach;with bed alarm set;with family/visitor present Nurse Communication: Mobility status PT  Visit Diagnosis: Unsteadiness on feet (R26.81);Muscle weakness (generalized) (M62.81);Difficulty in walking, not elsewhere classified (R26.2)    Time: 6815-9470 PT Time Calculation (min) (ACUTE ONLY): 20 min   Charges:   PT Evaluation $PT Eval Moderate Complexity: 1 Mod          Reuel Derby, PT, DPT  Acute Rehabilitation Services  Pager: [phone] Office: [phone]   Rudean Hitt 11/04/2019, 1:36 PM

## 2019-11-05 ENCOUNTER — Telehealth: Payer: Self-pay | Admitting: *Deleted

## 2019-11-05 LAB — MRSA PCR SCREENING: MRSA by PCR: NEGATIVE

## 2019-11-05 LAB — GLUCOSE, CAPILLARY
Glucose-Capillary: 133 mg/dL — ABNORMAL HIGH (ref 70–99)
Glucose-Capillary: 167 mg/dL — ABNORMAL HIGH (ref 70–99)

## 2019-11-05 NOTE — Discharge Summary (Signed)
Physician Discharge Summary  James Parsons ZOX:[id number] DOB: [birth year] DOA: 11/03/2019  PCP: Unk Pinto, MD  Admit date: 11/03/2019 Discharge date: 11/05/2019  Admitted From: Home Disposition:  Home  Discharge Condition:Stable CODE STATUS:FULL Diet recommendation: Heart Healthy   Brief/Interim Summary:  Patient is a 74 year old male with history of nonischemic cardiomyopathy, diabetes mellitus type 2, hyperlipidemia, COPD who presented to the emergency room with complaints of weakness, dizzy and near syncopal.  On presentation ,he was hypotensive and was given IV fluids.  He was also found to be hyponatremic, hypokalemic, had AKI.  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  Physical therapy evaluation done and he has been recommended home health.  His blood pressure has improved.  He and his family wants to initiate the work-up as an outpatient for the right lung mass in [place] where he is from originally.  He is medically stable for discharge home today.  Following problems were addressed during his hospitalization:   Near syncope: Presented with dizziness, weakness.  Blood pressure was soft on presentation.  CT head did not show any acute intracranial abnormalities.  PT recommended HH.  Orthostatic vitals negative  Hypotension: As per the wife, his blood pressure remains chronically low. BP has improved  Hyponatremia/hypokalemia: Improved.  Normocytic anemia: Currently stable  Diabetes type 2: Continue home regimen  Nonischemic cardiomyopathy: Takes Entresto, spironolactone at home.  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  Patient and his wife are aware about this finding and they want to initiate the work-up in [place].  Chronic pain syndrome: He has a spinal stimulator.  Takes Nucynta, Lyrica, Cymbalta.  Also  takes fentanyl chronically by intrathecal route  COPD: Currently stable.  Not on exacerbation  Hyperlipidemia: On statin  Discharge Diagnoses:  Principal Problem:   Near syncope Active Problems:   Hyperlipidemia associated with type 2 diabetes mellitus (HCC)   Essential hypertension   COPD (chronic obstructive pulmonary disease) (HCC)   Poorly controlled type 2 diabetes mellitus (HCC)   Anemia   Spinal cord stimulator status   RSD (reflex sympathetic dystrophy)   Non-ischemic cardiomyopathy (HCC)   Postural dizziness with presyncope    Discharge Instructions  Discharge Instructions    Diet - low sodium heart healthy   Complete by: As directed    Discharge instructions   Complete by: As directed    1)Please follow up with oncology and pulmonology through the referral of your PCP for the evaluation of right lung mass as soon as possible.   Increase activity slowly   Complete by: As directed      Allergies as of 11/05/2019      Reactions   Codeine Itching   Morphine And Related Other (See Comments)   hallucinations      Medication List    STOP taking these medications   glimepiride 4 MG tablet Commonly known as: AMARYL   hyoscyamine 0.125 MG tablet Commonly known as: LEVSIN     TAKE these medications   aspirin 81 MG chewable tablet Chew 81 mg by mouth at bedtime.   atorvastatin 80 MG tablet Commonly known as: LIPITOR TAKE 0.5 TABLETS (40 MG TOTAL) BY MOUTH DAILY AT 6 PM. What changed: how much to take   celecoxib 200 MG capsule Commonly known as: CELEBREX Take 200 mg by mouth in the morning and at bedtime.   DULoxetine 60 MG capsule Commonly known as: CYMBALTA  Take 60 mg by mouth daily.   Entresto 49-51 MG Generic drug: sacubitril-valsartan TAKE 1 TABLET BY MOUTH TWICE A DAY What changed: how much to take   lidocaine 5 % Commonly known as: LIDODERM Place 1 patch onto the skin daily as needed (pain).   metFORMIN 500 MG 24 hr tablet Commonly  known as: GLUCOPHAGE-XR Take 2 tablets 2 x /day with Meals for Diabetes What changed:   how much to take  how to take this  when to take this  additional instructions   Nucynta 100 MG Tabs Generic drug: Tapentadol HCl Take 100 mg by mouth See admin instructions. Take 100 mg by mouth twice daily, may also take 100 mg extra during the day as needed for pain   pregabalin 150 MG capsule Commonly known as: LYRICA Take 150 mg by mouth 2 (two) times daily.   PRESCRIPTION MEDICATION by Intrathecal route continuous. Fentanyl 2mg /ml solution . Daily dose 121.1 mg continuous through pump  Compounded at [facility] [phone] Pump is refilled every six months.   spironolactone 25 MG tablet Commonly known as: ALDACTONE Take 1 tablet (25 mg total) by mouth daily. Last refill without office visit, please call [phone] What changed: when to take this   Trulicity 1.5 CL/2.7NT Sopn Generic drug: Dulaglutide Inject 1.5 mg into the skin once a week. For diabetes and weight loss. What changed: additional instructions   Vitamin D-3 125 MCG (5000 UT) Tabs Take 5,000 Units by mouth 2 (two) times daily.       Follow-up Information    Unk Pinto, MD. Schedule an appointment as soon as possible for a visit in 1 week(s).   Specialty: Internal Medicine Contact information: [address] [place] [address] [phone]              Allergies  Allergen Reactions  . Codeine Itching  . Morphine And Related Other (See Comments)    hallucinations    Consultations:  None   Procedures/Studies: CT Head Wo Contrast  Result Date: 11/03/2019 CLINICAL DATA:  Facial trauma, fall, head injury EXAM: CT HEAD WITHOUT CONTRAST TECHNIQUE: Contiguous axial images were obtained from the base of the skull through the vertex without intravenous contrast. COMPARISON:  08/05/2019 FINDINGS: Brain: Normal anatomic configuration. Mild parenchymal  volume loss is present, commensurate with the patient's age and stable since prior examination. No abnormal intra or extra-axial mass lesion or fluid collection. No abnormal mass effect or midline shift. No evidence of acute intracranial hemorrhage or infarct. Ventricular size is normal. Cerebellum unremarkable. Vascular: Unremarkable Skull: Intact Sinuses/Orbits: Paranasal sinuses are clear. Orbits are unremarkable. Other: Mastoid air cells and middle ear cavities are clear. IMPRESSION: No acute intracranial injury.  No calvarial fracture. Electronically Signed   By: Fidela Salisbury MD   On: 11/03/2019 19:30   CT Angio Chest PE W and/or Wo Contrast  Result Date: 11/03/2019 CLINICAL DATA:  Chest pain, EXAM: CT ANGIOGRAPHY CHEST WITH CONTRAST TECHNIQUE: Multidetector CT imaging of the chest was performed using the standard protocol during bolus administration of intravenous contrast. Multiplanar CT image reconstructions and MIPs were obtained to evaluate the vascular anatomy. CONTRAST:  2mL OMNIPAQUE IOHEXOL 350 MG/ML SOLN COMPARISON:  None. FINDINGS: Cardiovascular: There is excellent opacification of the pulmonary arterial tree. There is no intraluminal filling defect to suggest acute pulmonary embolism. There is circumferential narrowing of several pulmonary arterial vessels, most notably the right middle lobe pulmonary artery and the segmental pulmonary artery to the superior  segment of the right lower lobe, secondary to the right hilar mass. The right superior pulmonary vein is nearly obliterated peripherally. Moderate multi-vessel coronary artery calcification. Global cardiac size within normal limits. No pericardial effusion. Thoracic aorta is age-appropriate with mild atherosclerotic calcification noted. Mediastinum/Nodes: A right hilar mass is present abutting the right mainstem bronchus and coming within 21 mm of the carina. This is best seen on coronal image # 75/10 the mass surrounds the right lower  lobar middle lobar and upper lobar bronchi with narrowing of the structures. The mass measures 4.8 x 5.7 x 6.3 cm on axial image # 63/7 and sagittal image # 84/10. Pathologic right paratracheal adenopathy is present with a lymph node measuring 13 mm in short axis diameter at axial image # 40/7. Lungs/Pleura: There is right-sided volume loss. Ground-glass pulmonary infiltrate within the right upper lobe likely represents a combination of atelectasis and postobstructive pneumonitis. The left lung is clear. No pneumothorax. No pleural effusion. Upper Abdomen: Unremarkable Musculoskeletal: No lytic or blastic bone lesions are seen. Review of the MIP images confirms the above findings. 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 The mass extends to within 21 mm of the carina. Associated pathologic right paratracheal adenopathy. No pulmonary embolism. Moderate coronary artery calcification. Aortic Atherosclerosis (ICD10-I70.0). Electronically Signed   By: Fidela Salisbury MD   On: 11/03/2019 19:42   DG Chest Port 1 View  Result Date: 11/03/2019 CLINICAL DATA:  74 year old male with history of weakness and dizziness. EXAM: PORTABLE CHEST 1 VIEW COMPARISON:  Chest x-ray 08/29/2016. FINDINGS: Mass-like fullness in the right hilar region with some surrounding interstitial prominence. Left lung is clear. No pleural effusions. No evidence of pulmonary edema. No pneumothorax. Heart size is mildly enlarged. IMPRESSION: 1. Mass-like appearance of the right hilar region concerning for potential malignancy. Further evaluation with contrast enhanced chest CT is suggested to better evaluate this finding. 2. Mild cardiomegaly. Electronically Signed   By: Vinnie Langton M.D.   On: 11/03/2019 18:04       Subjective: Patient seen and examined at the bedside this morning.  Hemodynamically  stable for discharge today.  Discharge Exam: Vitals:   11/05/19 0324 11/05/19 0724  BP: 108/67 122/77  Pulse: 79 87  Resp: 16 20  Temp: 97.8 F (36.6 C) 98 F (36.7 C)  SpO2: 93% 94%   Vitals:   11/05/19 0035 11/05/19 0300 11/05/19 0324 11/05/19 0724  BP: (!) 97/53  108/67 122/77  Pulse: 86 83 79 87  Resp: 18  16 20   Temp: 97.9 F (36.6 C)  97.8 F (36.6 C) 98 F (36.7 C)  TempSrc: Oral  Oral Oral  SpO2: 94% 94% 93% 94%  Weight:      Height:        General: Pt is alert, awake, not in acute distress Cardiovascular: RRR, S1/S2 +, no rubs, no gallops Respiratory: CTA bilaterally, no wheezing, no rhonchi Abdominal: Soft, NT, ND, bowel sounds + Extremities: no edema, no cyanosis    The results of significant diagnostics from this hospitalization (including imaging, microbiology, ancillary and laboratory) are listed below for reference.     Microbiology: Recent Results (from the past 240 hour(s))  SARS Coronavirus 2 by RT PCR (hospital order, performed in [facility] hospital lab) Nasopharyngeal Nasopharyngeal Swab     Status: None   Collection Time: 11/03/19  9:34 PM   Specimen: Nasopharyngeal Swab  Result Value Ref Range Status   SARS Coronavirus 2 NEGATIVE NEGATIVE Final    Comment: (NOTE) SARS-CoV-2 target nucleic acids are NOT DETECTED.  The SARS-CoV-2 RNA is generally detectable in upper and lower respiratory specimens during the acute phase of infection. The lowest concentration of SARS-CoV-2 viral copies this assay can detect is 250 copies / mL. A negative result does not preclude SARS-CoV-2 infection and should not be used as the sole basis for treatment or other patient management decisions.  A negative result may occur with improper specimen collection / handling, submission of specimen other than nasopharyngeal swab, presence of viral mutation(s) within the areas targeted by this assay, and inadequate number of viral copies (<250 copies / mL). A  negative result must be combined with clinical observations, patient history, and epidemiological information.  Fact Sheet for Patients:   StrictlyIdeas.no  Fact Sheet for Healthcare Providers: BankingDealers.co.za  This test is not yet approved or  cleared by the [place] FDA and has been authorized for detection and/or diagnosis of SARS-CoV-2 by FDA under an Emergency Use Authorization (EUA).  This EUA will remain in effect (meaning this test can be used) for the duration of the COVID-19 declaration under Section 564(b)(1) of the Act, 21 U.S.C. section 360bbb-3(b)(1), unless the authorization is terminated or revoked sooner.  Performed at [facility], [place] [address], [place], [place] [address]   MRSA PCR Screening     Status: None   Collection Time: 11/04/19 10:47 PM   Specimen: Nasal Mucosa; Nasopharyngeal  Result Value Ref Range Status   MRSA by PCR NEGATIVE NEGATIVE Final    Comment:        The GeneXpert MRSA Assay (FDA approved for NASAL specimens only), is one component of a comprehensive MRSA colonization surveillance program. It is not intended to diagnose MRSA infection nor to guide or monitor treatment for MRSA infections. Performed at [facility], [place] [address], [place], [place] [address]      Labs: BNP (last 3 results) No results for input(s): BNP in the last 8760 hours. Basic Metabolic Panel: Recent Labs  Lab 11/03/19 1644 11/03/19 1950 11/03/19 2117 11/04/19 0856  NA 128* 134*  --  134*  K 4.5 2.8*  --  4.5  CL 93* 110  --  101  CO2 24 18*  --  23  GLUCOSE 150* 109*  --  146*  BUN 18 11  --  9  CREATININE 1.18 0.71 0.93 0.77  CALCIUM 8.7* 5.8*  --  8.7*   Liver Function Tests: Recent Labs  Lab 11/03/19 1950  AST 10*  ALT 11  ALKPHOS 55  BILITOT 0.2*  PROT 4.3*  ALBUMIN 1.8*   No results for input(s): LIPASE, AMYLASE in the last 168 hours. No results for input(s):  AMMONIA in the last 168 hours. CBC: Recent Labs  Lab 11/03/19 1644 11/03/19 2117  WBC 14.3* 11.8*  HGB 10.1* 10.2*  HCT 32.7* 33.7*  MCV 83.6 84.9  PLT 403* 326   Cardiac Enzymes: No results for input(s): CKTOTAL, CKMB, CKMBINDEX, TROPONINI in the last 168 hours. BNP: Invalid input(s): POCBNP CBG: Recent Labs  Lab 11/04/19 0841 11/04/19 1209 11/04/19 1712 11/04/19 2251 11/05/19 0625  GLUCAP 139* 205* 173* 162* 133*   D-Dimer No results for input(s): DDIMER in the last 72 hours. Hgb A1c No results for input(s): HGBA1C in the last 72 hours. Lipid Profile No results for input(s): CHOL, HDL, LDLCALC, TRIG, CHOLHDL, LDLDIRECT in the last  72 hours. Thyroid function studies No results for input(s): TSH, T4TOTAL, T3FREE, THYROIDAB in the last 72 hours.  Invalid input(s): FREET3 Anemia work up No results for input(s): VITAMINB12, FOLATE, FERRITIN, TIBC, IRON, RETICCTPCT in the last 72 hours. Urinalysis    Component Value Date/Time   COLORURINE YELLOW 11/03/2019 2201   APPEARANCEUR CLEAR 11/03/2019 2201   LABSPEC 1.010 11/03/2019 2201   PHURINE 5.0 11/03/2019 2201   GLUCOSEU 150 (A) 11/03/2019 2201   HGBUR NEGATIVE 11/03/2019 2201   BILIRUBINUR NEGATIVE 11/03/2019 2201   KETONESUR NEGATIVE 11/03/2019 2201   PROTEINUR NEGATIVE 11/03/2019 2201   UROBILINOGEN 0.2 04/12/2014 1555   NITRITE NEGATIVE 11/03/2019 2201   LEUKOCYTESUR NEGATIVE 11/03/2019 2201   Sepsis Labs Invalid input(s): PROCALCITONIN,  WBC,  LACTICIDVEN Microbiology Recent Results (from the past 240 hour(s))  SARS Coronavirus 2 by RT PCR (hospital order, performed in [facility] hospital lab) Nasopharyngeal Nasopharyngeal Swab     Status: None   Collection Time: 11/03/19  9:34 PM   Specimen: Nasopharyngeal Swab  Result Value Ref Range Status   SARS Coronavirus 2 NEGATIVE NEGATIVE Final    Comment: (NOTE) SARS-CoV-2 target nucleic acids are NOT DETECTED.  The SARS-CoV-2 RNA is generally detectable in  upper and lower respiratory specimens during the acute phase of infection. The lowest concentration of SARS-CoV-2 viral copies this assay can detect is 250 copies / mL. A negative result does not preclude SARS-CoV-2 infection and should not be used as the sole basis for treatment or other patient management decisions.  A negative result may occur with improper specimen collection / handling, submission of specimen other than nasopharyngeal swab, presence of viral mutation(s) within the areas targeted by this assay, and inadequate number of viral copies (<250 copies / mL). A negative result must be combined with clinical observations, patient history, and epidemiological information.  Fact Sheet for Patients:   StrictlyIdeas.no  Fact Sheet for Healthcare Providers: BankingDealers.co.za  This test is not yet approved or  cleared by the [place] FDA and has been authorized for detection and/or diagnosis of SARS-CoV-2 by FDA under an Emergency Use Authorization (EUA).  This EUA will remain in effect (meaning this test can be used) for the duration of the COVID-19 declaration under Section 564(b)(1) of the Act, 21 U.S.C. section 360bbb-3(b)(1), unless the authorization is terminated or revoked sooner.  Performed at [facility], [place] [address], [place], [place] [address]   MRSA PCR Screening     Status: None   Collection Time: 11/04/19 10:47 PM   Specimen: Nasal Mucosa; Nasopharyngeal  Result Value Ref Range Status   MRSA by PCR NEGATIVE NEGATIVE Final    Comment:        The GeneXpert MRSA Assay (FDA approved for NASAL specimens only), is one component of a comprehensive MRSA colonization surveillance program. It is not intended to diagnose MRSA infection nor to guide or monitor treatment for MRSA infections. Performed at [facility], [place] [address] [place],  [address]     Please note: You were cared  for by a hospitalist during your hospital stay. Once you are discharged, your primary care physician will handle any further medical issues. Please note that NO REFILLS for any discharge medications will be authorized once you are discharged, as it is imperative that you return to your primary care physician (or establish a relationship with a primary care physician if you do not have one) for your post hospital discharge needs so that they can  reassess your need for medications and monitor your lab values.    Time coordinating discharge: 40 minutes  SIGNED:   Shelly Coss, MD  [facility] 11/05/2019, 11:17 AM Pager [phone]  If 7PM-7AM, please contact night-coverage [email or web address] Password [email or web address]

## 2019-11-05 NOTE — Telephone Encounter (Signed)
Called patient on 11/05/2019 , 3:25 PM in an attempt to reach the patient for a hospital follow up. Spoke with the patient's spouse.  Admit date: 11/03/19 Discharge: 11/05/19   He does not have any questions or concerns about medications from the hospital admission. The patient's medications were reviewed over the phone, they were counseled to bring in all current medications to the hospital follow up visit.   I advised the patient to call if any questions or concerns arise about the hospital admission or medications.    Home health was not started in the hospital.  All questions were answered and a follow up appointment was made. After speaking with Dr [name], no hospital follow up visit was scheduled. The patient lives in [place] the majority of the time and will seek follow up care there. The patient will be returning to [facility] on 11/06/2019 and will have the doctors send records to our office, concerning his care.  Prior to Admission medications   Medication Sig Start Date End Date Taking? Authorizing Provider  aspirin 81 MG chewable tablet Chew 81 mg by mouth at bedtime.    [provider]  atorvastatin (LIPITOR) 80 MG tablet TAKE 0.5 TABLETS (40 MG TOTAL) BY MOUTH DAILY AT 6 PM. Patient taking differently: Take 80 mg by mouth daily at 6 PM.  10/11/18   Liane Comber, NP  celecoxib (CELEBREX) 200 MG capsule Take 200 mg by mouth in the morning and at bedtime. 10/01/19 12/30/19  [provider]  Cholecalciferol (VITAMIN D-3) 5000 UNITS TABS Take 5,000 Units by mouth 2 (two) times daily.    [provider]  Dulaglutide (TRULICITY) 1.5 HX/5.0VW SOPN Inject 1.5 mg into the skin once a week. For diabetes and weight loss. Patient taking differently: Inject 1.5 mg into the skin once a week. Sundays for diabetes and weight loss. 06/17/19   Liane Comber, NP  DULoxetine (CYMBALTA) 60 MG capsule Take 60 mg by mouth daily.  10/09/12   [provider]  ENTRESTO 49-51 MG TAKE 1  TABLET BY MOUTH TWICE A DAY Patient taking differently: Take 0.5 tablets by mouth 2 (two) times daily.  05/02/18   Larey Dresser, MD  lidocaine (LIDODERM) 5 % Place 1 patch onto the skin daily as needed (pain). 04/26/16   Hongalgi, Lenis Dickinson, MD  metFORMIN (GLUCOPHAGE-XR) 500 MG 24 hr tablet Take 2 tablets 2 x /day with Meals for Diabetes Patient taking differently: Take 1,000 mg by mouth 2 (two) times daily with a meal.  11/05/18   Unk Pinto, MD  pregabalin (LYRICA) 150 MG capsule Take 150 mg by mouth 2 (two) times daily.    [provider]  PRESCRIPTION MEDICATION by Intrathecal route continuous. Fentanyl 2mg /ml solution . Daily dose 121.1 mg continuous through pump  Compounded at [facility] [phone] Pump is refilled every six months.    [provider]  spironolactone (ALDACTONE) 25 MG tablet Take 1 tablet (25 mg total) by mouth daily. Last refill without office visit, please call [phone] Patient taking differently: Take 25 mg by mouth every evening. Last refill without office visit, please call [phone] 05/27/19   Larey Dresser, MD  Tapentadol HCl (NUCYNTA) 100 MG TABS Take 100 mg by mouth See admin instructions. Take 100 mg by mouth twice daily, may also take 100 mg extra during the day as needed for pain    [provider]

## 2019-11-05 NOTE — Plan of Care (Signed)

## 2019-11-05 NOTE — Progress Notes (Signed)
Physical Therapy Treatment Patient Details Name: James Parsons MRN: [id number] DOB: Oct 11, 1945 Today's Date: 11/05/2019    History of Present Illness Pt is a 54 male admitted secondary to near syncopal episode. Pt found to have hypotension. PMH includes DM, HTN, R TKA, COPD, nonischemic cardiomyopathy and memory loss.     PT Comments    Pt with improved mobility and tolerance to activity. Pt/wife eager for pt to return to [facility] home. Wife present for treatment.    Follow Up Recommendations  Home health PT;Supervision/Assistance - 24 hour     Equipment Recommendations  None recommended by PT    Recommendations for Other Services       Precautions / Restrictions Precautions Precautions: Fall;Other (comment) Precaution Comments: Watch BP     Mobility  Bed Mobility Overal bed mobility: Needs Assistance Bed Mobility: Supine to Sit;Sit to Supine     Supine to sit: Supervision Sit to supine: Supervision   General bed mobility comments: supervision for lines  Transfers Overall transfer level: Needs assistance Equipment used: 4-wheeled walker Transfers: Sit to/from Stand Sit to Stand: Min assist         General transfer comment: Assist to bring hips up and for balance  Ambulation/Gait Ambulation/Gait assistance: Min assist Gait Distance (Feet): 40 Feet Assistive device: 4-wheeled walker Gait Pattern/deviations: Step-through pattern;Decreased stride length;Trunk flexed Gait velocity: decr Gait velocity interpretation: 1.31 - 2.62 ft/sec, indicative of limited community ambulator General Gait Details: Assist for balance and support. Pt felt weak after short distance and sat on rollator for transport back to room.    Stairs             Wheelchair Mobility    Modified Rankin (Stroke Patients Only)       Balance Overall balance assessment: Needs assistance Sitting-balance support: No upper extremity supported;Feet supported Sitting balance-Leahy Scale:  Fair     Standing balance support: During functional activity;Single extremity supported Standing balance-Leahy Scale: Poor Standing balance comment: UE support. Stood x 5+minutes with attempts at [place]: Awake/alert Behavior During Therapy: WFL for tasks assessed/performed Overall Cognitive Status: History of cognitive impairments - at baseline                                        Exercises      General Comments General comments (skin integrity, edema, etc.): Attempted orthostatics but difficulty obtaining BP in standing and then doubt accuracy. With the reading obtained didn't appear orthostatic      Pertinent Vitals/Pain Pain Assessment: No/denies pain    Home Living                      Prior Function            PT Goals (current goals can now be found in the care plan section) Acute Rehab PT Goals Patient Stated Goal: to feel better Progress towards PT goals: Progressing toward goals    Frequency    Min 3X/week      PT Plan Current plan remains appropriate    Co-evaluation              AM-PAC PT "6 Clicks" Mobility   Outcome Measure  Help needed turning from your back to  your side while in a flat bed without using bedrails?: None Help needed moving from lying on your back to sitting on the side of a flat bed without using bedrails?: None Help needed moving to and from a bed to a chair (including a wheelchair)?: A Little Help needed standing up from a chair using your arms (e.g., wheelchair or bedside chair)?: A Little Help needed to walk in hospital room?: A Little Help needed climbing 3-5 steps with a railing? : A Lot 6 Click Score: 19    End of Session Equipment Utilized During Treatment: Gait belt Activity Tolerance: Patient limited by fatigue Patient left: in bed;with call bell/phone within reach;with bed alarm set;with family/visitor  present Nurse Communication: Mobility status PT Visit Diagnosis: Unsteadiness on feet (R26.81);Muscle weakness (generalized) (M62.81);Difficulty in walking, not elsewhere classified (R26.2)     Time: 7619-5093 PT Time Calculation (min) (ACUTE ONLY): 32 min  Charges:  $Gait Training: 23-37 mins                     [place] Pager [phone] Office [place] 11/05/2019, 2:13 PM

## 2019-11-05 NOTE — TOC Transition Note (Addendum)
Transition of Care [facility]) - CM/SW Discharge Note   Patient Details  Name: James Parsons MRN: [id number] Date of Birth: 1946/02/20  [facility] CM/SW Contact:  Zenon Mayo, RN Phone Number: 11/05/2019, 11:55 AM   Clinical Narrative:    NCM offere choice, patient states he does not have a preference , NCM made referral to Home with [facility] awaiting call back to see if they can take referral.  Per Tiffany, she states the [facility] can take referral  [phone]. Soc will begin 24 to 48 hrs post dc.    Final next level of care: [place] Barriers to Discharge: No Barriers Identified   Patient Goals and CMS Choice Patient states their goals for this hospitalization and ongoing recovery are:: get better CMS Medicare.gov Compare Post Acute Care list provided to:: Patient Choice offered to / list presented to : Patient  Discharge Placement                       Discharge Plan and Services                  DME Agency: NA       HH Arranged: PT [employer] Agency: [facility] (formerly [employer]) Date [place]: 11/05/19 Time [place]: [id number] Representative spoke with at [place]: [place] ([place]) Interventions     Readmission Risk Interventions No flowsheet data found.

## 2019-11-06 ENCOUNTER — Other Ambulatory Visit: Payer: Self-pay | Admitting: Adult Health

## 2019-11-11 ENCOUNTER — Other Ambulatory Visit: Payer: Self-pay | Admitting: Internal Medicine

## 2019-11-11 DIAGNOSIS — R918 Other nonspecific abnormal finding of lung field: Secondary | ICD-10-CM

## 2019-11-14 ENCOUNTER — Other Ambulatory Visit: Payer: Self-pay | Admitting: Internal Medicine

## 2019-11-14 ENCOUNTER — Other Ambulatory Visit: Payer: Self-pay | Admitting: Adult Health

## 2019-11-14 DIAGNOSIS — E119 Type 2 diabetes mellitus without complications: Secondary | ICD-10-CM

## 2019-11-15 DIAGNOSIS — M545 Low back pain: Secondary | ICD-10-CM | POA: Diagnosis not present

## 2019-11-15 DIAGNOSIS — I428 Other cardiomyopathies: Secondary | ICD-10-CM | POA: Diagnosis not present

## 2019-11-15 DIAGNOSIS — D649 Anemia, unspecified: Secondary | ICD-10-CM | POA: Diagnosis not present

## 2019-11-15 DIAGNOSIS — E1165 Type 2 diabetes mellitus with hyperglycemia: Secondary | ICD-10-CM | POA: Diagnosis not present

## 2019-11-15 DIAGNOSIS — I110 Hypertensive heart disease with heart failure: Secondary | ICD-10-CM | POA: Diagnosis not present

## 2019-11-15 DIAGNOSIS — I509 Heart failure, unspecified: Secondary | ICD-10-CM | POA: Diagnosis not present

## 2019-11-15 DIAGNOSIS — J449 Chronic obstructive pulmonary disease, unspecified: Secondary | ICD-10-CM | POA: Diagnosis not present

## 2019-11-15 DIAGNOSIS — E1169 Type 2 diabetes mellitus with other specified complication: Secondary | ICD-10-CM | POA: Diagnosis not present

## 2019-11-15 DIAGNOSIS — G894 Chronic pain syndrome: Secondary | ICD-10-CM | POA: Diagnosis not present

## 2019-11-19 DIAGNOSIS — J449 Chronic obstructive pulmonary disease, unspecified: Secondary | ICD-10-CM | POA: Diagnosis not present

## 2019-11-19 DIAGNOSIS — E1169 Type 2 diabetes mellitus with other specified complication: Secondary | ICD-10-CM | POA: Diagnosis not present

## 2019-11-19 DIAGNOSIS — E1165 Type 2 diabetes mellitus with hyperglycemia: Secondary | ICD-10-CM | POA: Diagnosis not present

## 2019-11-19 DIAGNOSIS — I428 Other cardiomyopathies: Secondary | ICD-10-CM | POA: Diagnosis not present

## 2019-11-19 DIAGNOSIS — I110 Hypertensive heart disease with heart failure: Secondary | ICD-10-CM | POA: Diagnosis not present

## 2019-11-19 DIAGNOSIS — I509 Heart failure, unspecified: Secondary | ICD-10-CM | POA: Diagnosis not present

## 2019-11-19 DIAGNOSIS — D649 Anemia, unspecified: Secondary | ICD-10-CM | POA: Diagnosis not present

## 2019-11-19 DIAGNOSIS — G894 Chronic pain syndrome: Secondary | ICD-10-CM | POA: Diagnosis not present

## 2019-11-19 DIAGNOSIS — M545 Low back pain: Secondary | ICD-10-CM | POA: Diagnosis not present

## 2019-11-21 DIAGNOSIS — G894 Chronic pain syndrome: Secondary | ICD-10-CM | POA: Diagnosis not present

## 2019-11-21 DIAGNOSIS — D649 Anemia, unspecified: Secondary | ICD-10-CM | POA: Diagnosis not present

## 2019-11-21 DIAGNOSIS — E1169 Type 2 diabetes mellitus with other specified complication: Secondary | ICD-10-CM | POA: Diagnosis not present

## 2019-11-21 DIAGNOSIS — I428 Other cardiomyopathies: Secondary | ICD-10-CM | POA: Diagnosis not present

## 2019-11-21 DIAGNOSIS — J449 Chronic obstructive pulmonary disease, unspecified: Secondary | ICD-10-CM | POA: Diagnosis not present

## 2019-11-21 DIAGNOSIS — E1165 Type 2 diabetes mellitus with hyperglycemia: Secondary | ICD-10-CM | POA: Diagnosis not present

## 2019-11-21 DIAGNOSIS — I110 Hypertensive heart disease with heart failure: Secondary | ICD-10-CM | POA: Diagnosis not present

## 2019-11-21 DIAGNOSIS — M545 Low back pain: Secondary | ICD-10-CM | POA: Diagnosis not present

## 2019-11-21 DIAGNOSIS — I509 Heart failure, unspecified: Secondary | ICD-10-CM | POA: Diagnosis not present

## 2019-11-27 DIAGNOSIS — R918 Other nonspecific abnormal finding of lung field: Secondary | ICD-10-CM | POA: Diagnosis not present

## 2019-11-27 DIAGNOSIS — Z6826 Body mass index (BMI) 26.0-26.9, adult: Secondary | ICD-10-CM | POA: Diagnosis not present

## 2019-11-27 DIAGNOSIS — E663 Overweight: Secondary | ICD-10-CM | POA: Diagnosis not present

## 2019-11-27 DIAGNOSIS — Z0130 Encounter for examination of blood pressure without abnormal findings: Secondary | ICD-10-CM | POA: Diagnosis not present

## 2019-11-27 DIAGNOSIS — J449 Chronic obstructive pulmonary disease, unspecified: Secondary | ICD-10-CM | POA: Diagnosis not present

## 2019-11-28 DIAGNOSIS — E1169 Type 2 diabetes mellitus with other specified complication: Secondary | ICD-10-CM | POA: Diagnosis not present

## 2019-11-28 DIAGNOSIS — R042 Hemoptysis: Secondary | ICD-10-CM | POA: Diagnosis not present

## 2019-11-28 DIAGNOSIS — M545 Low back pain: Secondary | ICD-10-CM | POA: Diagnosis not present

## 2019-11-28 DIAGNOSIS — I428 Other cardiomyopathies: Secondary | ICD-10-CM | POA: Diagnosis not present

## 2019-11-28 DIAGNOSIS — D649 Anemia, unspecified: Secondary | ICD-10-CM | POA: Diagnosis not present

## 2019-11-28 DIAGNOSIS — R0602 Shortness of breath: Secondary | ICD-10-CM | POA: Diagnosis not present

## 2019-11-28 DIAGNOSIS — I110 Hypertensive heart disease with heart failure: Secondary | ICD-10-CM | POA: Diagnosis not present

## 2019-11-28 DIAGNOSIS — J449 Chronic obstructive pulmonary disease, unspecified: Secondary | ICD-10-CM | POA: Diagnosis not present

## 2019-11-28 DIAGNOSIS — G894 Chronic pain syndrome: Secondary | ICD-10-CM | POA: Diagnosis not present

## 2019-11-28 DIAGNOSIS — E1165 Type 2 diabetes mellitus with hyperglycemia: Secondary | ICD-10-CM | POA: Diagnosis not present

## 2019-11-28 DIAGNOSIS — J189 Pneumonia, unspecified organism: Secondary | ICD-10-CM | POA: Diagnosis not present

## 2019-11-28 DIAGNOSIS — I509 Heart failure, unspecified: Secondary | ICD-10-CM | POA: Diagnosis not present

## 2019-12-03 DIAGNOSIS — D649 Anemia, unspecified: Secondary | ICD-10-CM | POA: Diagnosis not present

## 2019-12-03 DIAGNOSIS — J449 Chronic obstructive pulmonary disease, unspecified: Secondary | ICD-10-CM | POA: Diagnosis not present

## 2019-12-03 DIAGNOSIS — I110 Hypertensive heart disease with heart failure: Secondary | ICD-10-CM | POA: Diagnosis not present

## 2019-12-03 DIAGNOSIS — E1169 Type 2 diabetes mellitus with other specified complication: Secondary | ICD-10-CM | POA: Diagnosis not present

## 2019-12-03 DIAGNOSIS — I509 Heart failure, unspecified: Secondary | ICD-10-CM | POA: Diagnosis not present

## 2019-12-03 DIAGNOSIS — G894 Chronic pain syndrome: Secondary | ICD-10-CM | POA: Diagnosis not present

## 2019-12-03 DIAGNOSIS — I428 Other cardiomyopathies: Secondary | ICD-10-CM | POA: Diagnosis not present

## 2019-12-03 DIAGNOSIS — E1165 Type 2 diabetes mellitus with hyperglycemia: Secondary | ICD-10-CM | POA: Diagnosis not present

## 2019-12-03 DIAGNOSIS — M545 Low back pain: Secondary | ICD-10-CM | POA: Diagnosis not present

## 2019-12-05 DIAGNOSIS — R042 Hemoptysis: Secondary | ICD-10-CM | POA: Diagnosis not present

## 2019-12-05 DIAGNOSIS — R05 Cough: Secondary | ICD-10-CM | POA: Diagnosis not present

## 2019-12-05 DIAGNOSIS — Z87891 Personal history of nicotine dependence: Secondary | ICD-10-CM | POA: Diagnosis not present

## 2019-12-05 DIAGNOSIS — J189 Pneumonia, unspecified organism: Secondary | ICD-10-CM | POA: Diagnosis not present

## 2019-12-09 DIAGNOSIS — R918 Other nonspecific abnormal finding of lung field: Secondary | ICD-10-CM | POA: Diagnosis not present

## 2019-12-15 DIAGNOSIS — E1165 Type 2 diabetes mellitus with hyperglycemia: Secondary | ICD-10-CM | POA: Diagnosis not present

## 2019-12-15 DIAGNOSIS — I509 Heart failure, unspecified: Secondary | ICD-10-CM | POA: Diagnosis not present

## 2019-12-15 DIAGNOSIS — I110 Hypertensive heart disease with heart failure: Secondary | ICD-10-CM | POA: Diagnosis not present

## 2019-12-15 DIAGNOSIS — D649 Anemia, unspecified: Secondary | ICD-10-CM | POA: Diagnosis not present

## 2019-12-15 DIAGNOSIS — J449 Chronic obstructive pulmonary disease, unspecified: Secondary | ICD-10-CM | POA: Diagnosis not present

## 2019-12-15 DIAGNOSIS — G894 Chronic pain syndrome: Secondary | ICD-10-CM | POA: Diagnosis not present

## 2019-12-16 DIAGNOSIS — E119 Type 2 diabetes mellitus without complications: Secondary | ICD-10-CM | POA: Diagnosis not present

## 2019-12-16 DIAGNOSIS — Z7982 Long term (current) use of aspirin: Secondary | ICD-10-CM | POA: Diagnosis not present

## 2019-12-16 DIAGNOSIS — M1990 Unspecified osteoarthritis, unspecified site: Secondary | ICD-10-CM | POA: Diagnosis not present

## 2019-12-16 DIAGNOSIS — R918 Other nonspecific abnormal finding of lung field: Secondary | ICD-10-CM | POA: Diagnosis not present

## 2019-12-16 DIAGNOSIS — E7800 Pure hypercholesterolemia, unspecified: Secondary | ICD-10-CM | POA: Diagnosis not present

## 2019-12-16 DIAGNOSIS — I110 Hypertensive heart disease with heart failure: Secondary | ICD-10-CM | POA: Diagnosis not present

## 2019-12-16 DIAGNOSIS — I509 Heart failure, unspecified: Secondary | ICD-10-CM | POA: Diagnosis not present

## 2019-12-16 DIAGNOSIS — J449 Chronic obstructive pulmonary disease, unspecified: Secondary | ICD-10-CM | POA: Diagnosis not present

## 2019-12-16 DIAGNOSIS — D649 Anemia, unspecified: Secondary | ICD-10-CM | POA: Diagnosis not present

## 2019-12-17 DIAGNOSIS — R590 Localized enlarged lymph nodes: Secondary | ICD-10-CM | POA: Diagnosis not present

## 2019-12-17 DIAGNOSIS — R918 Other nonspecific abnormal finding of lung field: Secondary | ICD-10-CM | POA: Diagnosis not present

## 2019-12-17 DIAGNOSIS — E7800 Pure hypercholesterolemia, unspecified: Secondary | ICD-10-CM | POA: Diagnosis not present

## 2019-12-17 DIAGNOSIS — I10 Essential (primary) hypertension: Secondary | ICD-10-CM | POA: Diagnosis not present

## 2019-12-17 DIAGNOSIS — M1990 Unspecified osteoarthritis, unspecified site: Secondary | ICD-10-CM | POA: Diagnosis not present

## 2019-12-17 DIAGNOSIS — D649 Anemia, unspecified: Secondary | ICD-10-CM | POA: Diagnosis not present

## 2019-12-17 DIAGNOSIS — I110 Hypertensive heart disease with heart failure: Secondary | ICD-10-CM | POA: Diagnosis not present

## 2019-12-17 DIAGNOSIS — J449 Chronic obstructive pulmonary disease, unspecified: Secondary | ICD-10-CM | POA: Diagnosis not present

## 2019-12-17 DIAGNOSIS — E119 Type 2 diabetes mellitus without complications: Secondary | ICD-10-CM | POA: Diagnosis not present

## 2019-12-17 DIAGNOSIS — I509 Heart failure, unspecified: Secondary | ICD-10-CM | POA: Diagnosis not present

## 2019-12-17 DIAGNOSIS — Z7982 Long term (current) use of aspirin: Secondary | ICD-10-CM | POA: Diagnosis not present

## 2019-12-18 DIAGNOSIS — G894 Chronic pain syndrome: Secondary | ICD-10-CM | POA: Diagnosis not present

## 2019-12-18 DIAGNOSIS — J449 Chronic obstructive pulmonary disease, unspecified: Secondary | ICD-10-CM | POA: Diagnosis not present

## 2019-12-18 DIAGNOSIS — D649 Anemia, unspecified: Secondary | ICD-10-CM | POA: Diagnosis not present

## 2019-12-18 DIAGNOSIS — I110 Hypertensive heart disease with heart failure: Secondary | ICD-10-CM | POA: Diagnosis not present

## 2019-12-18 DIAGNOSIS — E1165 Type 2 diabetes mellitus with hyperglycemia: Secondary | ICD-10-CM | POA: Diagnosis not present

## 2019-12-18 DIAGNOSIS — E119 Type 2 diabetes mellitus without complications: Secondary | ICD-10-CM | POA: Diagnosis not present

## 2019-12-18 DIAGNOSIS — E7800 Pure hypercholesterolemia, unspecified: Secondary | ICD-10-CM | POA: Diagnosis not present

## 2019-12-18 DIAGNOSIS — I509 Heart failure, unspecified: Secondary | ICD-10-CM | POA: Diagnosis not present

## 2019-12-23 DIAGNOSIS — J449 Chronic obstructive pulmonary disease, unspecified: Secondary | ICD-10-CM | POA: Diagnosis not present

## 2019-12-23 DIAGNOSIS — I509 Heart failure, unspecified: Secondary | ICD-10-CM | POA: Diagnosis not present

## 2019-12-23 DIAGNOSIS — D649 Anemia, unspecified: Secondary | ICD-10-CM | POA: Diagnosis not present

## 2019-12-23 DIAGNOSIS — I110 Hypertensive heart disease with heart failure: Secondary | ICD-10-CM | POA: Diagnosis not present

## 2019-12-23 DIAGNOSIS — G894 Chronic pain syndrome: Secondary | ICD-10-CM | POA: Diagnosis not present

## 2019-12-23 DIAGNOSIS — E1165 Type 2 diabetes mellitus with hyperglycemia: Secondary | ICD-10-CM | POA: Diagnosis not present

## 2019-12-24 DIAGNOSIS — C771 Secondary and unspecified malignant neoplasm of intrathoracic lymph nodes: Secondary | ICD-10-CM | POA: Diagnosis not present

## 2019-12-24 DIAGNOSIS — C3431 Malignant neoplasm of lower lobe, right bronchus or lung: Secondary | ICD-10-CM | POA: Diagnosis not present

## 2019-12-24 DIAGNOSIS — Z87891 Personal history of nicotine dependence: Secondary | ICD-10-CM | POA: Diagnosis not present

## 2019-12-27 DIAGNOSIS — C771 Secondary and unspecified malignant neoplasm of intrathoracic lymph nodes: Secondary | ICD-10-CM | POA: Diagnosis not present

## 2019-12-27 DIAGNOSIS — C3431 Malignant neoplasm of lower lobe, right bronchus or lung: Secondary | ICD-10-CM | POA: Diagnosis not present

## 2019-12-27 DIAGNOSIS — Z87891 Personal history of nicotine dependence: Secondary | ICD-10-CM | POA: Diagnosis not present

## 2019-12-30 DIAGNOSIS — C3431 Malignant neoplasm of lower lobe, right bronchus or lung: Secondary | ICD-10-CM | POA: Diagnosis not present

## 2019-12-30 DIAGNOSIS — Z87891 Personal history of nicotine dependence: Secondary | ICD-10-CM | POA: Diagnosis not present

## 2019-12-30 DIAGNOSIS — C771 Secondary and unspecified malignant neoplasm of intrathoracic lymph nodes: Secondary | ICD-10-CM | POA: Diagnosis not present

## 2019-12-31 DIAGNOSIS — I110 Hypertensive heart disease with heart failure: Secondary | ICD-10-CM | POA: Diagnosis not present

## 2019-12-31 DIAGNOSIS — I509 Heart failure, unspecified: Secondary | ICD-10-CM | POA: Diagnosis not present

## 2019-12-31 DIAGNOSIS — J449 Chronic obstructive pulmonary disease, unspecified: Secondary | ICD-10-CM | POA: Diagnosis not present

## 2019-12-31 DIAGNOSIS — G894 Chronic pain syndrome: Secondary | ICD-10-CM | POA: Diagnosis not present

## 2019-12-31 DIAGNOSIS — E1165 Type 2 diabetes mellitus with hyperglycemia: Secondary | ICD-10-CM | POA: Diagnosis not present

## 2019-12-31 DIAGNOSIS — D649 Anemia, unspecified: Secondary | ICD-10-CM | POA: Diagnosis not present

## 2020-01-02 DIAGNOSIS — R918 Other nonspecific abnormal finding of lung field: Secondary | ICD-10-CM | POA: Diagnosis not present

## 2020-01-06 DIAGNOSIS — D649 Anemia, unspecified: Secondary | ICD-10-CM | POA: Diagnosis not present

## 2020-01-07 DIAGNOSIS — Z87891 Personal history of nicotine dependence: Secondary | ICD-10-CM | POA: Diagnosis not present

## 2020-01-07 DIAGNOSIS — C3431 Malignant neoplasm of lower lobe, right bronchus or lung: Secondary | ICD-10-CM | POA: Diagnosis not present

## 2020-01-07 DIAGNOSIS — C771 Secondary and unspecified malignant neoplasm of intrathoracic lymph nodes: Secondary | ICD-10-CM | POA: Diagnosis not present

## 2020-01-09 ENCOUNTER — Ambulatory Visit: Payer: Medicare PPO | Admitting: Neurology

## 2020-01-09 DIAGNOSIS — C771 Secondary and unspecified malignant neoplasm of intrathoracic lymph nodes: Secondary | ICD-10-CM | POA: Diagnosis not present

## 2020-01-09 DIAGNOSIS — I110 Hypertensive heart disease with heart failure: Secondary | ICD-10-CM | POA: Diagnosis not present

## 2020-01-09 DIAGNOSIS — E1165 Type 2 diabetes mellitus with hyperglycemia: Secondary | ICD-10-CM | POA: Diagnosis not present

## 2020-01-09 DIAGNOSIS — Z87891 Personal history of nicotine dependence: Secondary | ICD-10-CM | POA: Diagnosis not present

## 2020-01-09 DIAGNOSIS — C3431 Malignant neoplasm of lower lobe, right bronchus or lung: Secondary | ICD-10-CM | POA: Diagnosis not present

## 2020-01-09 DIAGNOSIS — J449 Chronic obstructive pulmonary disease, unspecified: Secondary | ICD-10-CM | POA: Diagnosis not present

## 2020-01-09 DIAGNOSIS — D649 Anemia, unspecified: Secondary | ICD-10-CM | POA: Diagnosis not present

## 2020-01-09 DIAGNOSIS — G894 Chronic pain syndrome: Secondary | ICD-10-CM | POA: Diagnosis not present

## 2020-01-09 DIAGNOSIS — I509 Heart failure, unspecified: Secondary | ICD-10-CM | POA: Diagnosis not present

## 2020-01-12 DIAGNOSIS — Z20822 Contact with and (suspected) exposure to covid-19: Secondary | ICD-10-CM | POA: Diagnosis not present

## 2020-01-12 DIAGNOSIS — I5020 Unspecified systolic (congestive) heart failure: Secondary | ICD-10-CM | POA: Diagnosis not present

## 2020-01-13 DIAGNOSIS — I509 Heart failure, unspecified: Secondary | ICD-10-CM | POA: Diagnosis not present

## 2020-01-13 DIAGNOSIS — J9601 Acute respiratory failure with hypoxia: Secondary | ICD-10-CM | POA: Diagnosis not present

## 2020-01-13 DIAGNOSIS — I110 Hypertensive heart disease with heart failure: Secondary | ICD-10-CM | POA: Diagnosis not present

## 2020-01-13 DIAGNOSIS — E785 Hyperlipidemia, unspecified: Secondary | ICD-10-CM | POA: Diagnosis not present

## 2020-01-13 DIAGNOSIS — J441 Chronic obstructive pulmonary disease with (acute) exacerbation: Secondary | ICD-10-CM | POA: Diagnosis not present

## 2020-01-13 DIAGNOSIS — G8929 Other chronic pain: Secondary | ICD-10-CM | POA: Diagnosis not present

## 2020-01-13 DIAGNOSIS — Z885 Allergy status to narcotic agent status: Secondary | ICD-10-CM | POA: Diagnosis not present

## 2020-01-13 DIAGNOSIS — R918 Other nonspecific abnormal finding of lung field: Secondary | ICD-10-CM | POA: Diagnosis not present

## 2020-01-13 DIAGNOSIS — E119 Type 2 diabetes mellitus without complications: Secondary | ICD-10-CM | POA: Diagnosis not present

## 2020-01-13 DIAGNOSIS — J432 Centrilobular emphysema: Secondary | ICD-10-CM | POA: Diagnosis not present

## 2020-01-14 DIAGNOSIS — Z87891 Personal history of nicotine dependence: Secondary | ICD-10-CM | POA: Diagnosis not present

## 2020-01-14 DIAGNOSIS — J440 Chronic obstructive pulmonary disease with acute lower respiratory infection: Secondary | ICD-10-CM | POA: Diagnosis not present

## 2020-01-14 DIAGNOSIS — C3491 Malignant neoplasm of unspecified part of right bronchus or lung: Secondary | ICD-10-CM | POA: Diagnosis not present

## 2020-01-14 DIAGNOSIS — R0602 Shortness of breath: Secondary | ICD-10-CM | POA: Diagnosis not present

## 2020-01-14 DIAGNOSIS — G8929 Other chronic pain: Secondary | ICD-10-CM | POA: Diagnosis not present

## 2020-01-14 DIAGNOSIS — E1165 Type 2 diabetes mellitus with hyperglycemia: Secondary | ICD-10-CM | POA: Diagnosis not present

## 2020-01-14 DIAGNOSIS — C3401 Malignant neoplasm of right main bronchus: Secondary | ICD-10-CM | POA: Diagnosis not present

## 2020-01-14 DIAGNOSIS — J9601 Acute respiratory failure with hypoxia: Secondary | ICD-10-CM | POA: Diagnosis not present

## 2020-01-14 DIAGNOSIS — J441 Chronic obstructive pulmonary disease with (acute) exacerbation: Secondary | ICD-10-CM | POA: Diagnosis not present

## 2020-01-14 DIAGNOSIS — C3431 Malignant neoplasm of lower lobe, right bronchus or lung: Secondary | ICD-10-CM | POA: Diagnosis not present

## 2020-01-14 DIAGNOSIS — E785 Hyperlipidemia, unspecified: Secondary | ICD-10-CM | POA: Diagnosis not present

## 2020-01-14 DIAGNOSIS — J188 Other pneumonia, unspecified organism: Secondary | ICD-10-CM | POA: Diagnosis not present

## 2020-01-14 DIAGNOSIS — R918 Other nonspecific abnormal finding of lung field: Secondary | ICD-10-CM | POA: Diagnosis not present

## 2020-01-14 DIAGNOSIS — C771 Secondary and unspecified malignant neoplasm of intrathoracic lymph nodes: Secondary | ICD-10-CM | POA: Diagnosis not present

## 2020-01-15 DIAGNOSIS — E119 Type 2 diabetes mellitus without complications: Secondary | ICD-10-CM | POA: Diagnosis not present

## 2020-01-15 DIAGNOSIS — J449 Chronic obstructive pulmonary disease, unspecified: Secondary | ICD-10-CM | POA: Diagnosis not present

## 2020-01-15 DIAGNOSIS — C771 Secondary and unspecified malignant neoplasm of intrathoracic lymph nodes: Secondary | ICD-10-CM | POA: Diagnosis not present

## 2020-01-15 DIAGNOSIS — F0390 Unspecified dementia without behavioral disturbance: Secondary | ICD-10-CM | POA: Diagnosis not present

## 2020-01-15 DIAGNOSIS — R918 Other nonspecific abnormal finding of lung field: Secondary | ICD-10-CM | POA: Diagnosis not present

## 2020-01-15 DIAGNOSIS — F17210 Nicotine dependence, cigarettes, uncomplicated: Secondary | ICD-10-CM | POA: Diagnosis not present

## 2020-01-15 DIAGNOSIS — C3431 Malignant neoplasm of lower lobe, right bronchus or lung: Secondary | ICD-10-CM | POA: Diagnosis not present

## 2020-01-15 DIAGNOSIS — F17200 Nicotine dependence, unspecified, uncomplicated: Secondary | ICD-10-CM | POA: Diagnosis not present

## 2020-01-15 DIAGNOSIS — Z87891 Personal history of nicotine dependence: Secondary | ICD-10-CM | POA: Diagnosis not present

## 2020-01-16 DIAGNOSIS — C771 Secondary and unspecified malignant neoplasm of intrathoracic lymph nodes: Secondary | ICD-10-CM | POA: Diagnosis not present

## 2020-01-16 DIAGNOSIS — Z87891 Personal history of nicotine dependence: Secondary | ICD-10-CM | POA: Diagnosis not present

## 2020-01-16 DIAGNOSIS — C3431 Malignant neoplasm of lower lobe, right bronchus or lung: Secondary | ICD-10-CM | POA: Diagnosis not present

## 2020-01-17 DIAGNOSIS — C3490 Malignant neoplasm of unspecified part of unspecified bronchus or lung: Secondary | ICD-10-CM | POA: Diagnosis not present

## 2020-01-17 DIAGNOSIS — J441 Chronic obstructive pulmonary disease with (acute) exacerbation: Secondary | ICD-10-CM | POA: Diagnosis not present

## 2020-01-17 DIAGNOSIS — J90 Pleural effusion, not elsewhere classified: Secondary | ICD-10-CM | POA: Diagnosis not present

## 2020-01-17 DIAGNOSIS — J9601 Acute respiratory failure with hypoxia: Secondary | ICD-10-CM | POA: Diagnosis not present

## 2020-01-17 DIAGNOSIS — E119 Type 2 diabetes mellitus without complications: Secondary | ICD-10-CM | POA: Diagnosis not present

## 2020-01-17 DIAGNOSIS — J189 Pneumonia, unspecified organism: Secondary | ICD-10-CM | POA: Diagnosis not present

## 2020-01-17 DIAGNOSIS — R918 Other nonspecific abnormal finding of lung field: Secondary | ICD-10-CM | POA: Diagnosis not present

## 2020-01-17 DIAGNOSIS — R0602 Shortness of breath: Secondary | ICD-10-CM | POA: Diagnosis not present

## 2020-01-17 DIAGNOSIS — R079 Chest pain, unspecified: Secondary | ICD-10-CM | POA: Diagnosis not present

## 2020-01-17 DIAGNOSIS — J9600 Acute respiratory failure, unspecified whether with hypoxia or hypercapnia: Secondary | ICD-10-CM | POA: Diagnosis not present

## 2020-01-18 DIAGNOSIS — C3490 Malignant neoplasm of unspecified part of unspecified bronchus or lung: Secondary | ICD-10-CM | POA: Diagnosis not present

## 2020-01-18 DIAGNOSIS — G939 Disorder of brain, unspecified: Secondary | ICD-10-CM | POA: Diagnosis not present

## 2020-01-18 DIAGNOSIS — J441 Chronic obstructive pulmonary disease with (acute) exacerbation: Secondary | ICD-10-CM | POA: Diagnosis not present

## 2020-01-18 DIAGNOSIS — J9601 Acute respiratory failure with hypoxia: Secondary | ICD-10-CM | POA: Diagnosis not present

## 2020-01-18 DIAGNOSIS — J189 Pneumonia, unspecified organism: Secondary | ICD-10-CM | POA: Diagnosis not present

## 2020-01-18 DIAGNOSIS — R918 Other nonspecific abnormal finding of lung field: Secondary | ICD-10-CM | POA: Diagnosis not present

## 2020-01-18 DIAGNOSIS — J9600 Acute respiratory failure, unspecified whether with hypoxia or hypercapnia: Secondary | ICD-10-CM | POA: Diagnosis not present

## 2020-01-18 DIAGNOSIS — E119 Type 2 diabetes mellitus without complications: Secondary | ICD-10-CM | POA: Diagnosis not present

## 2020-01-19 DIAGNOSIS — E119 Type 2 diabetes mellitus without complications: Secondary | ICD-10-CM | POA: Diagnosis not present

## 2020-01-19 DIAGNOSIS — R918 Other nonspecific abnormal finding of lung field: Secondary | ICD-10-CM | POA: Diagnosis not present

## 2020-01-19 DIAGNOSIS — J441 Chronic obstructive pulmonary disease with (acute) exacerbation: Secondary | ICD-10-CM | POA: Diagnosis not present

## 2020-01-19 DIAGNOSIS — J9600 Acute respiratory failure, unspecified whether with hypoxia or hypercapnia: Secondary | ICD-10-CM | POA: Diagnosis not present

## 2020-01-19 DIAGNOSIS — C3490 Malignant neoplasm of unspecified part of unspecified bronchus or lung: Secondary | ICD-10-CM | POA: Diagnosis not present

## 2020-01-19 DIAGNOSIS — J9601 Acute respiratory failure with hypoxia: Secondary | ICD-10-CM | POA: Diagnosis not present

## 2020-01-19 DIAGNOSIS — J189 Pneumonia, unspecified organism: Secondary | ICD-10-CM | POA: Diagnosis not present

## 2020-01-20 DIAGNOSIS — C771 Secondary and unspecified malignant neoplasm of intrathoracic lymph nodes: Secondary | ICD-10-CM | POA: Diagnosis not present

## 2020-01-20 DIAGNOSIS — C3431 Malignant neoplasm of lower lobe, right bronchus or lung: Secondary | ICD-10-CM | POA: Diagnosis not present

## 2020-01-20 DIAGNOSIS — Z87891 Personal history of nicotine dependence: Secondary | ICD-10-CM | POA: Diagnosis not present

## 2020-01-21 DIAGNOSIS — Z87891 Personal history of nicotine dependence: Secondary | ICD-10-CM | POA: Diagnosis not present

## 2020-01-21 DIAGNOSIS — C771 Secondary and unspecified malignant neoplasm of intrathoracic lymph nodes: Secondary | ICD-10-CM | POA: Diagnosis not present

## 2020-01-21 DIAGNOSIS — C3431 Malignant neoplasm of lower lobe, right bronchus or lung: Secondary | ICD-10-CM | POA: Diagnosis not present

## 2020-01-22 DIAGNOSIS — I509 Heart failure, unspecified: Secondary | ICD-10-CM | POA: Diagnosis not present

## 2020-01-22 DIAGNOSIS — C3431 Malignant neoplasm of lower lobe, right bronchus or lung: Secondary | ICD-10-CM | POA: Diagnosis not present

## 2020-01-22 DIAGNOSIS — C3491 Malignant neoplasm of unspecified part of right bronchus or lung: Secondary | ICD-10-CM | POA: Diagnosis not present

## 2020-01-22 DIAGNOSIS — Z87891 Personal history of nicotine dependence: Secondary | ICD-10-CM | POA: Diagnosis not present

## 2020-01-22 DIAGNOSIS — E119 Type 2 diabetes mellitus without complications: Secondary | ICD-10-CM | POA: Diagnosis not present

## 2020-01-22 DIAGNOSIS — J441 Chronic obstructive pulmonary disease with (acute) exacerbation: Secondary | ICD-10-CM | POA: Diagnosis not present

## 2020-01-22 DIAGNOSIS — R2689 Other abnormalities of gait and mobility: Secondary | ICD-10-CM | POA: Diagnosis not present

## 2020-01-22 DIAGNOSIS — E785 Hyperlipidemia, unspecified: Secondary | ICD-10-CM | POA: Diagnosis not present

## 2020-01-22 DIAGNOSIS — C771 Secondary and unspecified malignant neoplasm of intrathoracic lymph nodes: Secondary | ICD-10-CM | POA: Diagnosis not present

## 2020-01-22 DIAGNOSIS — I110 Hypertensive heart disease with heart failure: Secondary | ICD-10-CM | POA: Diagnosis not present

## 2020-01-22 DIAGNOSIS — R531 Weakness: Secondary | ICD-10-CM | POA: Diagnosis not present

## 2020-01-22 DIAGNOSIS — J9601 Acute respiratory failure with hypoxia: Secondary | ICD-10-CM | POA: Diagnosis not present

## 2020-01-22 DIAGNOSIS — Z96651 Presence of right artificial knee joint: Secondary | ICD-10-CM | POA: Diagnosis not present

## 2020-01-22 DIAGNOSIS — G8929 Other chronic pain: Secondary | ICD-10-CM | POA: Diagnosis not present

## 2020-01-23 DIAGNOSIS — J441 Chronic obstructive pulmonary disease with (acute) exacerbation: Secondary | ICD-10-CM | POA: Diagnosis not present

## 2020-01-23 DIAGNOSIS — C3491 Malignant neoplasm of unspecified part of right bronchus or lung: Secondary | ICD-10-CM | POA: Diagnosis not present

## 2020-01-23 DIAGNOSIS — J9601 Acute respiratory failure with hypoxia: Secondary | ICD-10-CM | POA: Diagnosis not present

## 2020-01-23 DIAGNOSIS — C771 Secondary and unspecified malignant neoplasm of intrathoracic lymph nodes: Secondary | ICD-10-CM | POA: Diagnosis not present

## 2020-01-23 DIAGNOSIS — I509 Heart failure, unspecified: Secondary | ICD-10-CM | POA: Diagnosis not present

## 2020-01-23 DIAGNOSIS — E785 Hyperlipidemia, unspecified: Secondary | ICD-10-CM | POA: Diagnosis not present

## 2020-01-23 DIAGNOSIS — E119 Type 2 diabetes mellitus without complications: Secondary | ICD-10-CM | POA: Diagnosis not present

## 2020-01-23 DIAGNOSIS — I110 Hypertensive heart disease with heart failure: Secondary | ICD-10-CM | POA: Diagnosis not present

## 2020-01-23 DIAGNOSIS — G8929 Other chronic pain: Secondary | ICD-10-CM | POA: Diagnosis not present

## 2020-01-23 DIAGNOSIS — Z87891 Personal history of nicotine dependence: Secondary | ICD-10-CM | POA: Diagnosis not present

## 2020-01-23 DIAGNOSIS — Z96651 Presence of right artificial knee joint: Secondary | ICD-10-CM | POA: Diagnosis not present

## 2020-01-23 DIAGNOSIS — C3431 Malignant neoplasm of lower lobe, right bronchus or lung: Secondary | ICD-10-CM | POA: Diagnosis not present

## 2020-01-24 DIAGNOSIS — D649 Anemia, unspecified: Secondary | ICD-10-CM | POA: Diagnosis not present

## 2020-01-24 DIAGNOSIS — C3490 Malignant neoplasm of unspecified part of unspecified bronchus or lung: Secondary | ICD-10-CM | POA: Diagnosis not present

## 2020-01-27 DIAGNOSIS — C3431 Malignant neoplasm of lower lobe, right bronchus or lung: Secondary | ICD-10-CM | POA: Diagnosis not present

## 2020-01-27 DIAGNOSIS — Z87891 Personal history of nicotine dependence: Secondary | ICD-10-CM | POA: Diagnosis not present

## 2020-01-27 DIAGNOSIS — C771 Secondary and unspecified malignant neoplasm of intrathoracic lymph nodes: Secondary | ICD-10-CM | POA: Diagnosis not present

## 2020-01-28 DIAGNOSIS — Z87891 Personal history of nicotine dependence: Secondary | ICD-10-CM | POA: Diagnosis not present

## 2020-01-28 DIAGNOSIS — C3431 Malignant neoplasm of lower lobe, right bronchus or lung: Secondary | ICD-10-CM | POA: Diagnosis not present

## 2020-01-28 DIAGNOSIS — C771 Secondary and unspecified malignant neoplasm of intrathoracic lymph nodes: Secondary | ICD-10-CM | POA: Diagnosis not present

## 2020-01-29 DIAGNOSIS — E785 Hyperlipidemia, unspecified: Secondary | ICD-10-CM | POA: Diagnosis not present

## 2020-01-29 DIAGNOSIS — I509 Heart failure, unspecified: Secondary | ICD-10-CM | POA: Diagnosis not present

## 2020-01-29 DIAGNOSIS — E119 Type 2 diabetes mellitus without complications: Secondary | ICD-10-CM | POA: Diagnosis not present

## 2020-01-29 DIAGNOSIS — G8929 Other chronic pain: Secondary | ICD-10-CM | POA: Diagnosis not present

## 2020-01-29 DIAGNOSIS — I110 Hypertensive heart disease with heart failure: Secondary | ICD-10-CM | POA: Diagnosis not present

## 2020-01-29 DIAGNOSIS — C771 Secondary and unspecified malignant neoplasm of intrathoracic lymph nodes: Secondary | ICD-10-CM | POA: Diagnosis not present

## 2020-01-29 DIAGNOSIS — C3491 Malignant neoplasm of unspecified part of right bronchus or lung: Secondary | ICD-10-CM | POA: Diagnosis not present

## 2020-01-29 DIAGNOSIS — Z96651 Presence of right artificial knee joint: Secondary | ICD-10-CM | POA: Diagnosis not present

## 2020-01-29 DIAGNOSIS — J9601 Acute respiratory failure with hypoxia: Secondary | ICD-10-CM | POA: Diagnosis not present

## 2020-01-29 DIAGNOSIS — C3431 Malignant neoplasm of lower lobe, right bronchus or lung: Secondary | ICD-10-CM | POA: Diagnosis not present

## 2020-01-29 DIAGNOSIS — J441 Chronic obstructive pulmonary disease with (acute) exacerbation: Secondary | ICD-10-CM | POA: Diagnosis not present

## 2020-01-29 DIAGNOSIS — D649 Anemia, unspecified: Secondary | ICD-10-CM | POA: Diagnosis not present

## 2020-01-29 DIAGNOSIS — C3490 Malignant neoplasm of unspecified part of unspecified bronchus or lung: Secondary | ICD-10-CM | POA: Diagnosis not present

## 2020-01-29 DIAGNOSIS — Z87891 Personal history of nicotine dependence: Secondary | ICD-10-CM | POA: Diagnosis not present

## 2020-01-30 DIAGNOSIS — Z87891 Personal history of nicotine dependence: Secondary | ICD-10-CM | POA: Diagnosis not present

## 2020-01-30 DIAGNOSIS — C771 Secondary and unspecified malignant neoplasm of intrathoracic lymph nodes: Secondary | ICD-10-CM | POA: Diagnosis not present

## 2020-01-30 DIAGNOSIS — C3431 Malignant neoplasm of lower lobe, right bronchus or lung: Secondary | ICD-10-CM | POA: Diagnosis not present

## 2020-01-30 DIAGNOSIS — D649 Anemia, unspecified: Secondary | ICD-10-CM | POA: Diagnosis not present

## 2020-01-30 DIAGNOSIS — C3490 Malignant neoplasm of unspecified part of unspecified bronchus or lung: Secondary | ICD-10-CM | POA: Diagnosis not present

## 2020-01-30 DIAGNOSIS — Z5111 Encounter for antineoplastic chemotherapy: Secondary | ICD-10-CM | POA: Diagnosis not present

## 2020-01-31 DIAGNOSIS — Z87891 Personal history of nicotine dependence: Secondary | ICD-10-CM | POA: Diagnosis not present

## 2020-01-31 DIAGNOSIS — J9601 Acute respiratory failure with hypoxia: Secondary | ICD-10-CM | POA: Diagnosis not present

## 2020-01-31 DIAGNOSIS — I509 Heart failure, unspecified: Secondary | ICD-10-CM | POA: Diagnosis not present

## 2020-01-31 DIAGNOSIS — E119 Type 2 diabetes mellitus without complications: Secondary | ICD-10-CM | POA: Diagnosis not present

## 2020-01-31 DIAGNOSIS — C3491 Malignant neoplasm of unspecified part of right bronchus or lung: Secondary | ICD-10-CM | POA: Diagnosis not present

## 2020-01-31 DIAGNOSIS — I110 Hypertensive heart disease with heart failure: Secondary | ICD-10-CM | POA: Diagnosis not present

## 2020-01-31 DIAGNOSIS — C771 Secondary and unspecified malignant neoplasm of intrathoracic lymph nodes: Secondary | ICD-10-CM | POA: Diagnosis not present

## 2020-01-31 DIAGNOSIS — G8929 Other chronic pain: Secondary | ICD-10-CM | POA: Diagnosis not present

## 2020-01-31 DIAGNOSIS — C3431 Malignant neoplasm of lower lobe, right bronchus or lung: Secondary | ICD-10-CM | POA: Diagnosis not present

## 2020-01-31 DIAGNOSIS — J441 Chronic obstructive pulmonary disease with (acute) exacerbation: Secondary | ICD-10-CM | POA: Diagnosis not present

## 2020-01-31 DIAGNOSIS — Z96651 Presence of right artificial knee joint: Secondary | ICD-10-CM | POA: Diagnosis not present

## 2020-01-31 DIAGNOSIS — E785 Hyperlipidemia, unspecified: Secondary | ICD-10-CM | POA: Diagnosis not present

## 2020-02-03 DIAGNOSIS — C771 Secondary and unspecified malignant neoplasm of intrathoracic lymph nodes: Secondary | ICD-10-CM | POA: Diagnosis not present

## 2020-02-03 DIAGNOSIS — Z87891 Personal history of nicotine dependence: Secondary | ICD-10-CM | POA: Diagnosis not present

## 2020-02-03 DIAGNOSIS — C3431 Malignant neoplasm of lower lobe, right bronchus or lung: Secondary | ICD-10-CM | POA: Diagnosis not present

## 2020-02-04 DIAGNOSIS — C3431 Malignant neoplasm of lower lobe, right bronchus or lung: Secondary | ICD-10-CM | POA: Diagnosis not present

## 2020-02-04 DIAGNOSIS — C771 Secondary and unspecified malignant neoplasm of intrathoracic lymph nodes: Secondary | ICD-10-CM | POA: Diagnosis not present

## 2020-02-04 DIAGNOSIS — I081 Rheumatic disorders of both mitral and tricuspid valves: Secondary | ICD-10-CM | POA: Diagnosis not present

## 2020-02-04 DIAGNOSIS — I5020 Unspecified systolic (congestive) heart failure: Secondary | ICD-10-CM | POA: Diagnosis not present

## 2020-02-04 DIAGNOSIS — R0602 Shortness of breath: Secondary | ICD-10-CM | POA: Diagnosis not present

## 2020-02-04 DIAGNOSIS — Z87891 Personal history of nicotine dependence: Secondary | ICD-10-CM | POA: Diagnosis not present

## 2020-02-05 DIAGNOSIS — J441 Chronic obstructive pulmonary disease with (acute) exacerbation: Secondary | ICD-10-CM | POA: Diagnosis not present

## 2020-02-05 DIAGNOSIS — C3491 Malignant neoplasm of unspecified part of right bronchus or lung: Secondary | ICD-10-CM | POA: Diagnosis not present

## 2020-02-05 DIAGNOSIS — Z96651 Presence of right artificial knee joint: Secondary | ICD-10-CM | POA: Diagnosis not present

## 2020-02-05 DIAGNOSIS — G8929 Other chronic pain: Secondary | ICD-10-CM | POA: Diagnosis not present

## 2020-02-05 DIAGNOSIS — E785 Hyperlipidemia, unspecified: Secondary | ICD-10-CM | POA: Diagnosis not present

## 2020-02-05 DIAGNOSIS — I509 Heart failure, unspecified: Secondary | ICD-10-CM | POA: Diagnosis not present

## 2020-02-05 DIAGNOSIS — C771 Secondary and unspecified malignant neoplasm of intrathoracic lymph nodes: Secondary | ICD-10-CM | POA: Diagnosis not present

## 2020-02-05 DIAGNOSIS — J9601 Acute respiratory failure with hypoxia: Secondary | ICD-10-CM | POA: Diagnosis not present

## 2020-02-05 DIAGNOSIS — E119 Type 2 diabetes mellitus without complications: Secondary | ICD-10-CM | POA: Diagnosis not present

## 2020-02-05 DIAGNOSIS — C3431 Malignant neoplasm of lower lobe, right bronchus or lung: Secondary | ICD-10-CM | POA: Diagnosis not present

## 2020-02-05 DIAGNOSIS — Z87891 Personal history of nicotine dependence: Secondary | ICD-10-CM | POA: Diagnosis not present

## 2020-02-05 DIAGNOSIS — I110 Hypertensive heart disease with heart failure: Secondary | ICD-10-CM | POA: Diagnosis not present

## 2020-02-06 ENCOUNTER — Ambulatory Visit: Payer: Medicare PPO | Admitting: Adult Health

## 2020-02-06 DIAGNOSIS — Z5111 Encounter for antineoplastic chemotherapy: Secondary | ICD-10-CM | POA: Diagnosis not present

## 2020-02-06 DIAGNOSIS — Z87891 Personal history of nicotine dependence: Secondary | ICD-10-CM | POA: Diagnosis not present

## 2020-02-06 DIAGNOSIS — C771 Secondary and unspecified malignant neoplasm of intrathoracic lymph nodes: Secondary | ICD-10-CM | POA: Diagnosis not present

## 2020-02-06 DIAGNOSIS — C3431 Malignant neoplasm of lower lobe, right bronchus or lung: Secondary | ICD-10-CM | POA: Diagnosis not present

## 2020-02-06 DIAGNOSIS — D649 Anemia, unspecified: Secondary | ICD-10-CM | POA: Diagnosis not present

## 2020-02-06 DIAGNOSIS — C3490 Malignant neoplasm of unspecified part of unspecified bronchus or lung: Secondary | ICD-10-CM | POA: Diagnosis not present

## 2020-02-07 DIAGNOSIS — C771 Secondary and unspecified malignant neoplasm of intrathoracic lymph nodes: Secondary | ICD-10-CM | POA: Diagnosis not present

## 2020-02-07 DIAGNOSIS — Z87891 Personal history of nicotine dependence: Secondary | ICD-10-CM | POA: Diagnosis not present

## 2020-02-07 DIAGNOSIS — C3431 Malignant neoplasm of lower lobe, right bronchus or lung: Secondary | ICD-10-CM | POA: Diagnosis not present

## 2020-02-10 DIAGNOSIS — C771 Secondary and unspecified malignant neoplasm of intrathoracic lymph nodes: Secondary | ICD-10-CM | POA: Diagnosis not present

## 2020-02-10 DIAGNOSIS — C3490 Malignant neoplasm of unspecified part of unspecified bronchus or lung: Secondary | ICD-10-CM | POA: Diagnosis not present

## 2020-02-10 DIAGNOSIS — D649 Anemia, unspecified: Secondary | ICD-10-CM | POA: Diagnosis not present

## 2020-02-10 DIAGNOSIS — Z87891 Personal history of nicotine dependence: Secondary | ICD-10-CM | POA: Diagnosis not present

## 2020-02-10 DIAGNOSIS — C3431 Malignant neoplasm of lower lobe, right bronchus or lung: Secondary | ICD-10-CM | POA: Diagnosis not present

## 2020-02-11 DIAGNOSIS — D649 Anemia, unspecified: Secondary | ICD-10-CM | POA: Diagnosis not present

## 2020-02-11 DIAGNOSIS — Z87891 Personal history of nicotine dependence: Secondary | ICD-10-CM | POA: Diagnosis not present

## 2020-02-11 DIAGNOSIS — C3490 Malignant neoplasm of unspecified part of unspecified bronchus or lung: Secondary | ICD-10-CM | POA: Diagnosis not present

## 2020-02-11 DIAGNOSIS — C771 Secondary and unspecified malignant neoplasm of intrathoracic lymph nodes: Secondary | ICD-10-CM | POA: Diagnosis not present

## 2020-02-11 DIAGNOSIS — C3431 Malignant neoplasm of lower lobe, right bronchus or lung: Secondary | ICD-10-CM | POA: Diagnosis not present

## 2020-02-12 DIAGNOSIS — C771 Secondary and unspecified malignant neoplasm of intrathoracic lymph nodes: Secondary | ICD-10-CM | POA: Diagnosis not present

## 2020-02-12 DIAGNOSIS — Z87891 Personal history of nicotine dependence: Secondary | ICD-10-CM | POA: Diagnosis not present

## 2020-02-12 DIAGNOSIS — C3431 Malignant neoplasm of lower lobe, right bronchus or lung: Secondary | ICD-10-CM | POA: Diagnosis not present

## 2020-02-13 DIAGNOSIS — C3431 Malignant neoplasm of lower lobe, right bronchus or lung: Secondary | ICD-10-CM | POA: Diagnosis not present

## 2020-02-13 DIAGNOSIS — Z87891 Personal history of nicotine dependence: Secondary | ICD-10-CM | POA: Diagnosis not present

## 2020-02-13 DIAGNOSIS — C771 Secondary and unspecified malignant neoplasm of intrathoracic lymph nodes: Secondary | ICD-10-CM | POA: Diagnosis not present

## 2020-02-14 DIAGNOSIS — Z87891 Personal history of nicotine dependence: Secondary | ICD-10-CM | POA: Diagnosis not present

## 2020-02-14 DIAGNOSIS — C3431 Malignant neoplasm of lower lobe, right bronchus or lung: Secondary | ICD-10-CM | POA: Diagnosis not present

## 2020-02-14 DIAGNOSIS — C771 Secondary and unspecified malignant neoplasm of intrathoracic lymph nodes: Secondary | ICD-10-CM | POA: Diagnosis not present

## 2020-02-17 DIAGNOSIS — D649 Anemia, unspecified: Secondary | ICD-10-CM | POA: Diagnosis not present

## 2020-02-17 DIAGNOSIS — R102 Pelvic and perineal pain: Secondary | ICD-10-CM | POA: Diagnosis not present

## 2020-02-17 DIAGNOSIS — M545 Low back pain, unspecified: Secondary | ICD-10-CM | POA: Diagnosis not present

## 2020-02-17 DIAGNOSIS — C3431 Malignant neoplasm of lower lobe, right bronchus or lung: Secondary | ICD-10-CM | POA: Diagnosis not present

## 2020-02-17 DIAGNOSIS — Z87891 Personal history of nicotine dependence: Secondary | ICD-10-CM | POA: Diagnosis not present

## 2020-02-17 DIAGNOSIS — C3490 Malignant neoplasm of unspecified part of unspecified bronchus or lung: Secondary | ICD-10-CM | POA: Diagnosis not present

## 2020-02-17 DIAGNOSIS — C771 Secondary and unspecified malignant neoplasm of intrathoracic lymph nodes: Secondary | ICD-10-CM | POA: Diagnosis not present

## 2020-02-17 DIAGNOSIS — M47816 Spondylosis without myelopathy or radiculopathy, lumbar region: Secondary | ICD-10-CM | POA: Diagnosis not present

## 2020-02-18 DIAGNOSIS — J441 Chronic obstructive pulmonary disease with (acute) exacerbation: Secondary | ICD-10-CM | POA: Diagnosis not present

## 2020-02-18 DIAGNOSIS — C771 Secondary and unspecified malignant neoplasm of intrathoracic lymph nodes: Secondary | ICD-10-CM | POA: Diagnosis not present

## 2020-02-18 DIAGNOSIS — E119 Type 2 diabetes mellitus without complications: Secondary | ICD-10-CM | POA: Diagnosis not present

## 2020-02-18 DIAGNOSIS — C3491 Malignant neoplasm of unspecified part of right bronchus or lung: Secondary | ICD-10-CM | POA: Diagnosis not present

## 2020-02-18 DIAGNOSIS — J9601 Acute respiratory failure with hypoxia: Secondary | ICD-10-CM | POA: Diagnosis not present

## 2020-02-18 DIAGNOSIS — E785 Hyperlipidemia, unspecified: Secondary | ICD-10-CM | POA: Diagnosis not present

## 2020-02-18 DIAGNOSIS — G8929 Other chronic pain: Secondary | ICD-10-CM | POA: Diagnosis not present

## 2020-02-18 DIAGNOSIS — Z96651 Presence of right artificial knee joint: Secondary | ICD-10-CM | POA: Diagnosis not present

## 2020-02-18 DIAGNOSIS — I509 Heart failure, unspecified: Secondary | ICD-10-CM | POA: Diagnosis not present

## 2020-02-18 DIAGNOSIS — Z87891 Personal history of nicotine dependence: Secondary | ICD-10-CM | POA: Diagnosis not present

## 2020-02-18 DIAGNOSIS — C3431 Malignant neoplasm of lower lobe, right bronchus or lung: Secondary | ICD-10-CM | POA: Diagnosis not present

## 2020-02-18 DIAGNOSIS — I110 Hypertensive heart disease with heart failure: Secondary | ICD-10-CM | POA: Diagnosis not present

## 2020-02-19 DIAGNOSIS — C3431 Malignant neoplasm of lower lobe, right bronchus or lung: Secondary | ICD-10-CM | POA: Diagnosis not present

## 2020-02-19 DIAGNOSIS — C771 Secondary and unspecified malignant neoplasm of intrathoracic lymph nodes: Secondary | ICD-10-CM | POA: Diagnosis not present

## 2020-02-19 DIAGNOSIS — Z87891 Personal history of nicotine dependence: Secondary | ICD-10-CM | POA: Diagnosis not present

## 2020-02-20 DIAGNOSIS — C3431 Malignant neoplasm of lower lobe, right bronchus or lung: Secondary | ICD-10-CM | POA: Diagnosis not present

## 2020-02-20 DIAGNOSIS — C771 Secondary and unspecified malignant neoplasm of intrathoracic lymph nodes: Secondary | ICD-10-CM | POA: Diagnosis not present

## 2020-02-20 DIAGNOSIS — Z87891 Personal history of nicotine dependence: Secondary | ICD-10-CM | POA: Diagnosis not present

## 2020-02-21 DIAGNOSIS — C3490 Malignant neoplasm of unspecified part of unspecified bronchus or lung: Secondary | ICD-10-CM | POA: Diagnosis not present

## 2020-02-21 DIAGNOSIS — J441 Chronic obstructive pulmonary disease with (acute) exacerbation: Secondary | ICD-10-CM | POA: Diagnosis not present

## 2020-02-21 DIAGNOSIS — J9601 Acute respiratory failure with hypoxia: Secondary | ICD-10-CM | POA: Diagnosis not present

## 2020-02-21 DIAGNOSIS — Z87891 Personal history of nicotine dependence: Secondary | ICD-10-CM | POA: Diagnosis not present

## 2020-02-21 DIAGNOSIS — G8929 Other chronic pain: Secondary | ICD-10-CM | POA: Diagnosis not present

## 2020-02-21 DIAGNOSIS — C3431 Malignant neoplasm of lower lobe, right bronchus or lung: Secondary | ICD-10-CM | POA: Diagnosis not present

## 2020-02-21 DIAGNOSIS — E119 Type 2 diabetes mellitus without complications: Secondary | ICD-10-CM | POA: Diagnosis not present

## 2020-02-21 DIAGNOSIS — I110 Hypertensive heart disease with heart failure: Secondary | ICD-10-CM | POA: Diagnosis not present

## 2020-02-21 DIAGNOSIS — C3491 Malignant neoplasm of unspecified part of right bronchus or lung: Secondary | ICD-10-CM | POA: Diagnosis not present

## 2020-02-21 DIAGNOSIS — E785 Hyperlipidemia, unspecified: Secondary | ICD-10-CM | POA: Diagnosis not present

## 2020-02-21 DIAGNOSIS — D649 Anemia, unspecified: Secondary | ICD-10-CM | POA: Diagnosis not present

## 2020-02-21 DIAGNOSIS — I509 Heart failure, unspecified: Secondary | ICD-10-CM | POA: Diagnosis not present

## 2020-02-21 DIAGNOSIS — Z96651 Presence of right artificial knee joint: Secondary | ICD-10-CM | POA: Diagnosis not present

## 2020-02-21 DIAGNOSIS — C771 Secondary and unspecified malignant neoplasm of intrathoracic lymph nodes: Secondary | ICD-10-CM | POA: Diagnosis not present

## 2020-02-24 DIAGNOSIS — Z87891 Personal history of nicotine dependence: Secondary | ICD-10-CM | POA: Diagnosis not present

## 2020-02-24 DIAGNOSIS — C771 Secondary and unspecified malignant neoplasm of intrathoracic lymph nodes: Secondary | ICD-10-CM | POA: Diagnosis not present

## 2020-02-24 DIAGNOSIS — C3431 Malignant neoplasm of lower lobe, right bronchus or lung: Secondary | ICD-10-CM | POA: Diagnosis not present

## 2020-02-25 DIAGNOSIS — E119 Type 2 diabetes mellitus without complications: Secondary | ICD-10-CM | POA: Diagnosis not present

## 2020-02-25 DIAGNOSIS — I509 Heart failure, unspecified: Secondary | ICD-10-CM | POA: Diagnosis not present

## 2020-02-25 DIAGNOSIS — G8929 Other chronic pain: Secondary | ICD-10-CM | POA: Diagnosis not present

## 2020-02-25 DIAGNOSIS — E785 Hyperlipidemia, unspecified: Secondary | ICD-10-CM | POA: Diagnosis not present

## 2020-02-25 DIAGNOSIS — J9601 Acute respiratory failure with hypoxia: Secondary | ICD-10-CM | POA: Diagnosis not present

## 2020-02-25 DIAGNOSIS — Z96651 Presence of right artificial knee joint: Secondary | ICD-10-CM | POA: Diagnosis not present

## 2020-02-25 DIAGNOSIS — C3431 Malignant neoplasm of lower lobe, right bronchus or lung: Secondary | ICD-10-CM | POA: Diagnosis not present

## 2020-02-25 DIAGNOSIS — J441 Chronic obstructive pulmonary disease with (acute) exacerbation: Secondary | ICD-10-CM | POA: Diagnosis not present

## 2020-02-25 DIAGNOSIS — C3491 Malignant neoplasm of unspecified part of right bronchus or lung: Secondary | ICD-10-CM | POA: Diagnosis not present

## 2020-02-25 DIAGNOSIS — Z87891 Personal history of nicotine dependence: Secondary | ICD-10-CM | POA: Diagnosis not present

## 2020-02-25 DIAGNOSIS — I110 Hypertensive heart disease with heart failure: Secondary | ICD-10-CM | POA: Diagnosis not present

## 2020-02-25 DIAGNOSIS — C771 Secondary and unspecified malignant neoplasm of intrathoracic lymph nodes: Secondary | ICD-10-CM | POA: Diagnosis not present

## 2020-02-26 DIAGNOSIS — Z87891 Personal history of nicotine dependence: Secondary | ICD-10-CM | POA: Diagnosis not present

## 2020-02-26 DIAGNOSIS — C771 Secondary and unspecified malignant neoplasm of intrathoracic lymph nodes: Secondary | ICD-10-CM | POA: Diagnosis not present

## 2020-02-26 DIAGNOSIS — C3431 Malignant neoplasm of lower lobe, right bronchus or lung: Secondary | ICD-10-CM | POA: Diagnosis not present

## 2020-02-27 DIAGNOSIS — C771 Secondary and unspecified malignant neoplasm of intrathoracic lymph nodes: Secondary | ICD-10-CM | POA: Diagnosis not present

## 2020-02-27 DIAGNOSIS — Z87891 Personal history of nicotine dependence: Secondary | ICD-10-CM | POA: Diagnosis not present

## 2020-02-27 DIAGNOSIS — C3431 Malignant neoplasm of lower lobe, right bronchus or lung: Secondary | ICD-10-CM | POA: Diagnosis not present

## 2020-02-28 DIAGNOSIS — C3431 Malignant neoplasm of lower lobe, right bronchus or lung: Secondary | ICD-10-CM | POA: Diagnosis not present

## 2020-02-28 DIAGNOSIS — Z87891 Personal history of nicotine dependence: Secondary | ICD-10-CM | POA: Diagnosis not present

## 2020-02-28 DIAGNOSIS — C771 Secondary and unspecified malignant neoplasm of intrathoracic lymph nodes: Secondary | ICD-10-CM | POA: Diagnosis not present

## 2020-02-28 DIAGNOSIS — D649 Anemia, unspecified: Secondary | ICD-10-CM | POA: Diagnosis not present

## 2020-02-28 DIAGNOSIS — C3490 Malignant neoplasm of unspecified part of unspecified bronchus or lung: Secondary | ICD-10-CM | POA: Diagnosis not present

## 2020-03-02 DIAGNOSIS — E119 Type 2 diabetes mellitus without complications: Secondary | ICD-10-CM | POA: Diagnosis not present

## 2020-03-02 DIAGNOSIS — I110 Hypertensive heart disease with heart failure: Secondary | ICD-10-CM | POA: Diagnosis not present

## 2020-03-02 DIAGNOSIS — Z96651 Presence of right artificial knee joint: Secondary | ICD-10-CM | POA: Diagnosis not present

## 2020-03-02 DIAGNOSIS — C771 Secondary and unspecified malignant neoplasm of intrathoracic lymph nodes: Secondary | ICD-10-CM | POA: Diagnosis not present

## 2020-03-02 DIAGNOSIS — J9601 Acute respiratory failure with hypoxia: Secondary | ICD-10-CM | POA: Diagnosis not present

## 2020-03-02 DIAGNOSIS — C3431 Malignant neoplasm of lower lobe, right bronchus or lung: Secondary | ICD-10-CM | POA: Diagnosis not present

## 2020-03-02 DIAGNOSIS — E785 Hyperlipidemia, unspecified: Secondary | ICD-10-CM | POA: Diagnosis not present

## 2020-03-02 DIAGNOSIS — Z87891 Personal history of nicotine dependence: Secondary | ICD-10-CM | POA: Diagnosis not present

## 2020-03-02 DIAGNOSIS — G8929 Other chronic pain: Secondary | ICD-10-CM | POA: Diagnosis not present

## 2020-03-02 DIAGNOSIS — J441 Chronic obstructive pulmonary disease with (acute) exacerbation: Secondary | ICD-10-CM | POA: Diagnosis not present

## 2020-03-02 DIAGNOSIS — I509 Heart failure, unspecified: Secondary | ICD-10-CM | POA: Diagnosis not present

## 2020-03-02 DIAGNOSIS — C3491 Malignant neoplasm of unspecified part of right bronchus or lung: Secondary | ICD-10-CM | POA: Diagnosis not present

## 2020-03-04 DIAGNOSIS — C3491 Malignant neoplasm of unspecified part of right bronchus or lung: Secondary | ICD-10-CM | POA: Diagnosis not present

## 2020-03-04 DIAGNOSIS — Z96651 Presence of right artificial knee joint: Secondary | ICD-10-CM | POA: Diagnosis not present

## 2020-03-04 DIAGNOSIS — E785 Hyperlipidemia, unspecified: Secondary | ICD-10-CM | POA: Diagnosis not present

## 2020-03-04 DIAGNOSIS — G8929 Other chronic pain: Secondary | ICD-10-CM | POA: Diagnosis not present

## 2020-03-04 DIAGNOSIS — J9601 Acute respiratory failure with hypoxia: Secondary | ICD-10-CM | POA: Diagnosis not present

## 2020-03-04 DIAGNOSIS — J441 Chronic obstructive pulmonary disease with (acute) exacerbation: Secondary | ICD-10-CM | POA: Diagnosis not present

## 2020-03-04 DIAGNOSIS — I509 Heart failure, unspecified: Secondary | ICD-10-CM | POA: Diagnosis not present

## 2020-03-04 DIAGNOSIS — I110 Hypertensive heart disease with heart failure: Secondary | ICD-10-CM | POA: Diagnosis not present

## 2020-03-04 DIAGNOSIS — E119 Type 2 diabetes mellitus without complications: Secondary | ICD-10-CM | POA: Diagnosis not present

## 2020-03-07 DIAGNOSIS — J90 Pleural effusion, not elsewhere classified: Secondary | ICD-10-CM | POA: Diagnosis not present

## 2020-03-07 DIAGNOSIS — J189 Pneumonia, unspecified organism: Secondary | ICD-10-CM | POA: Diagnosis not present

## 2020-03-07 DIAGNOSIS — I5021 Acute systolic (congestive) heart failure: Secondary | ICD-10-CM | POA: Diagnosis not present

## 2020-03-07 DIAGNOSIS — R918 Other nonspecific abnormal finding of lung field: Secondary | ICD-10-CM | POA: Diagnosis not present

## 2020-03-07 DIAGNOSIS — C44222 Squamous cell carcinoma of skin of right ear and external auricular canal: Secondary | ICD-10-CM | POA: Diagnosis not present

## 2020-03-07 DIAGNOSIS — I5043 Acute on chronic combined systolic (congestive) and diastolic (congestive) heart failure: Secondary | ICD-10-CM | POA: Diagnosis not present

## 2020-03-07 DIAGNOSIS — I483 Typical atrial flutter: Secondary | ICD-10-CM | POA: Diagnosis not present

## 2020-03-07 DIAGNOSIS — J918 Pleural effusion in other conditions classified elsewhere: Secondary | ICD-10-CM | POA: Diagnosis not present

## 2020-03-07 DIAGNOSIS — J440 Chronic obstructive pulmonary disease with acute lower respiratory infection: Secondary | ICD-10-CM | POA: Diagnosis not present

## 2020-03-07 DIAGNOSIS — I5022 Chronic systolic (congestive) heart failure: Secondary | ICD-10-CM | POA: Diagnosis not present

## 2020-03-07 DIAGNOSIS — I4892 Unspecified atrial flutter: Secondary | ICD-10-CM | POA: Diagnosis not present

## 2020-03-07 DIAGNOSIS — J810 Acute pulmonary edema: Secondary | ICD-10-CM | POA: Diagnosis not present

## 2020-03-07 DIAGNOSIS — E785 Hyperlipidemia, unspecified: Secondary | ICD-10-CM | POA: Diagnosis not present

## 2020-03-07 DIAGNOSIS — E119 Type 2 diabetes mellitus without complications: Secondary | ICD-10-CM | POA: Diagnosis not present

## 2020-03-07 DIAGNOSIS — C3491 Malignant neoplasm of unspecified part of right bronchus or lung: Secondary | ICD-10-CM | POA: Diagnosis not present

## 2020-03-07 DIAGNOSIS — J811 Chronic pulmonary edema: Secondary | ICD-10-CM | POA: Diagnosis not present

## 2020-03-07 DIAGNOSIS — G9341 Metabolic encephalopathy: Secondary | ICD-10-CM | POA: Diagnosis not present

## 2020-03-07 DIAGNOSIS — J441 Chronic obstructive pulmonary disease with (acute) exacerbation: Secondary | ICD-10-CM | POA: Diagnosis not present

## 2020-03-07 DIAGNOSIS — J9600 Acute respiratory failure, unspecified whether with hypoxia or hypercapnia: Secondary | ICD-10-CM | POA: Diagnosis not present

## 2020-03-07 DIAGNOSIS — R9431 Abnormal electrocardiogram [ECG] [EKG]: Secondary | ICD-10-CM | POA: Diagnosis not present

## 2020-03-07 DIAGNOSIS — R0602 Shortness of breath: Secondary | ICD-10-CM | POA: Diagnosis not present

## 2020-03-07 DIAGNOSIS — I447 Left bundle-branch block, unspecified: Secondary | ICD-10-CM | POA: Diagnosis not present

## 2020-03-07 DIAGNOSIS — Z20822 Contact with and (suspected) exposure to covid-19: Secondary | ICD-10-CM | POA: Diagnosis not present

## 2020-03-21 DIAGNOSIS — I130 Hypertensive heart and chronic kidney disease with heart failure and stage 1 through stage 4 chronic kidney disease, or unspecified chronic kidney disease: Secondary | ICD-10-CM | POA: Diagnosis not present

## 2020-03-21 DIAGNOSIS — I5020 Unspecified systolic (congestive) heart failure: Secondary | ICD-10-CM | POA: Diagnosis not present

## 2020-03-21 DIAGNOSIS — J90 Pleural effusion, not elsewhere classified: Secondary | ICD-10-CM | POA: Diagnosis not present

## 2020-03-21 DIAGNOSIS — N189 Chronic kidney disease, unspecified: Secondary | ICD-10-CM | POA: Diagnosis not present

## 2020-03-21 DIAGNOSIS — E1122 Type 2 diabetes mellitus with diabetic chronic kidney disease: Secondary | ICD-10-CM | POA: Diagnosis not present

## 2020-03-21 DIAGNOSIS — C3491 Malignant neoplasm of unspecified part of right bronchus or lung: Secondary | ICD-10-CM | POA: Diagnosis not present

## 2020-03-21 DIAGNOSIS — I2510 Atherosclerotic heart disease of native coronary artery without angina pectoris: Secondary | ICD-10-CM | POA: Diagnosis not present

## 2020-03-21 DIAGNOSIS — K5790 Diverticulosis of intestine, part unspecified, without perforation or abscess without bleeding: Secondary | ICD-10-CM | POA: Diagnosis not present

## 2020-03-21 DIAGNOSIS — G8929 Other chronic pain: Secondary | ICD-10-CM | POA: Diagnosis not present

## 2020-03-24 DIAGNOSIS — D649 Anemia, unspecified: Secondary | ICD-10-CM | POA: Diagnosis not present

## 2020-03-24 DIAGNOSIS — C3490 Malignant neoplasm of unspecified part of unspecified bronchus or lung: Secondary | ICD-10-CM | POA: Diagnosis not present

## 2020-03-24 DIAGNOSIS — M545 Low back pain, unspecified: Secondary | ICD-10-CM | POA: Diagnosis not present

## 2020-03-25 DIAGNOSIS — N189 Chronic kidney disease, unspecified: Secondary | ICD-10-CM | POA: Diagnosis not present

## 2020-03-25 DIAGNOSIS — G8929 Other chronic pain: Secondary | ICD-10-CM | POA: Diagnosis not present

## 2020-03-25 DIAGNOSIS — E1122 Type 2 diabetes mellitus with diabetic chronic kidney disease: Secondary | ICD-10-CM | POA: Diagnosis not present

## 2020-03-25 DIAGNOSIS — J90 Pleural effusion, not elsewhere classified: Secondary | ICD-10-CM | POA: Diagnosis not present

## 2020-03-25 DIAGNOSIS — C3491 Malignant neoplasm of unspecified part of right bronchus or lung: Secondary | ICD-10-CM | POA: Diagnosis not present

## 2020-03-25 DIAGNOSIS — I5020 Unspecified systolic (congestive) heart failure: Secondary | ICD-10-CM | POA: Diagnosis not present

## 2020-03-25 DIAGNOSIS — I130 Hypertensive heart and chronic kidney disease with heart failure and stage 1 through stage 4 chronic kidney disease, or unspecified chronic kidney disease: Secondary | ICD-10-CM | POA: Diagnosis not present

## 2020-03-25 DIAGNOSIS — K5790 Diverticulosis of intestine, part unspecified, without perforation or abscess without bleeding: Secondary | ICD-10-CM | POA: Diagnosis not present

## 2020-03-25 DIAGNOSIS — I2510 Atherosclerotic heart disease of native coronary artery without angina pectoris: Secondary | ICD-10-CM | POA: Diagnosis not present

## 2020-03-30 DIAGNOSIS — Z87891 Personal history of nicotine dependence: Secondary | ICD-10-CM | POA: Diagnosis not present

## 2020-03-30 DIAGNOSIS — C771 Secondary and unspecified malignant neoplasm of intrathoracic lymph nodes: Secondary | ICD-10-CM | POA: Diagnosis not present

## 2020-03-30 DIAGNOSIS — C3431 Malignant neoplasm of lower lobe, right bronchus or lung: Secondary | ICD-10-CM | POA: Diagnosis not present

## 2020-03-31 DIAGNOSIS — M532X5 Spinal instabilities, thoracolumbar region: Secondary | ICD-10-CM | POA: Diagnosis not present

## 2020-03-31 DIAGNOSIS — M6281 Muscle weakness (generalized): Secondary | ICD-10-CM | POA: Diagnosis not present

## 2020-04-01 DIAGNOSIS — I428 Other cardiomyopathies: Secondary | ICD-10-CM | POA: Diagnosis not present

## 2020-04-01 DIAGNOSIS — I5023 Acute on chronic systolic (congestive) heart failure: Secondary | ICD-10-CM | POA: Diagnosis not present

## 2020-04-01 DIAGNOSIS — C3491 Malignant neoplasm of unspecified part of right bronchus or lung: Secondary | ICD-10-CM | POA: Diagnosis not present

## 2020-04-01 DIAGNOSIS — I959 Hypotension, unspecified: Secondary | ICD-10-CM | POA: Diagnosis not present

## 2020-04-05 DIAGNOSIS — I447 Left bundle-branch block, unspecified: Secondary | ICD-10-CM | POA: Diagnosis not present

## 2020-04-05 DIAGNOSIS — I110 Hypertensive heart disease with heart failure: Secondary | ICD-10-CM | POA: Diagnosis not present

## 2020-04-05 DIAGNOSIS — R531 Weakness: Secondary | ICD-10-CM | POA: Diagnosis not present

## 2020-04-05 DIAGNOSIS — I5033 Acute on chronic diastolic (congestive) heart failure: Secondary | ICD-10-CM | POA: Diagnosis not present

## 2020-04-05 DIAGNOSIS — Z7189 Other specified counseling: Secondary | ICD-10-CM | POA: Diagnosis not present

## 2020-04-05 DIAGNOSIS — M545 Low back pain, unspecified: Secondary | ICD-10-CM | POA: Diagnosis not present

## 2020-04-05 DIAGNOSIS — D638 Anemia in other chronic diseases classified elsewhere: Secondary | ICD-10-CM | POA: Diagnosis not present

## 2020-04-05 DIAGNOSIS — Z66 Do not resuscitate: Secondary | ICD-10-CM | POA: Diagnosis not present

## 2020-04-05 DIAGNOSIS — Z515 Encounter for palliative care: Secondary | ICD-10-CM | POA: Diagnosis not present

## 2020-04-05 DIAGNOSIS — D649 Anemia, unspecified: Secondary | ICD-10-CM | POA: Diagnosis not present

## 2020-04-05 DIAGNOSIS — E119 Type 2 diabetes mellitus without complications: Secondary | ICD-10-CM | POA: Diagnosis not present

## 2020-04-05 DIAGNOSIS — R918 Other nonspecific abnormal finding of lung field: Secondary | ICD-10-CM | POA: Diagnosis not present

## 2020-04-05 DIAGNOSIS — R4182 Altered mental status, unspecified: Secondary | ICD-10-CM | POA: Diagnosis not present

## 2020-04-05 DIAGNOSIS — R9431 Abnormal electrocardiogram [ECG] [EKG]: Secondary | ICD-10-CM | POA: Diagnosis not present

## 2020-04-05 DIAGNOSIS — I10 Essential (primary) hypertension: Secondary | ICD-10-CM | POA: Diagnosis not present

## 2020-04-05 DIAGNOSIS — J90 Pleural effusion, not elsewhere classified: Secondary | ICD-10-CM | POA: Diagnosis not present

## 2020-04-05 DIAGNOSIS — I5023 Acute on chronic systolic (congestive) heart failure: Secondary | ICD-10-CM | POA: Diagnosis not present

## 2020-04-05 DIAGNOSIS — I5022 Chronic systolic (congestive) heart failure: Secondary | ICD-10-CM | POA: Diagnosis not present

## 2020-04-05 DIAGNOSIS — Z20822 Contact with and (suspected) exposure to covid-19: Secondary | ICD-10-CM | POA: Diagnosis not present

## 2020-04-05 DIAGNOSIS — I509 Heart failure, unspecified: Secondary | ICD-10-CM | POA: Diagnosis not present

## 2020-04-05 DIAGNOSIS — C3491 Malignant neoplasm of unspecified part of right bronchus or lung: Secondary | ICD-10-CM | POA: Diagnosis not present

## 2020-04-05 DIAGNOSIS — R0602 Shortness of breath: Secondary | ICD-10-CM | POA: Diagnosis not present

## 2020-04-05 DIAGNOSIS — Z87891 Personal history of nicotine dependence: Secondary | ICD-10-CM | POA: Diagnosis not present

## 2020-04-05 DIAGNOSIS — M549 Dorsalgia, unspecified: Secondary | ICD-10-CM | POA: Diagnosis not present

## 2020-04-05 DIAGNOSIS — I493 Ventricular premature depolarization: Secondary | ICD-10-CM | POA: Diagnosis not present

## 2020-04-05 DIAGNOSIS — I472 Ventricular tachycardia: Secondary | ICD-10-CM | POA: Diagnosis not present

## 2020-04-05 DIAGNOSIS — I5021 Acute systolic (congestive) heart failure: Secondary | ICD-10-CM | POA: Diagnosis not present

## 2020-04-05 DIAGNOSIS — E785 Hyperlipidemia, unspecified: Secondary | ICD-10-CM | POA: Diagnosis not present

## 2020-04-05 DIAGNOSIS — Z9981 Dependence on supplemental oxygen: Secondary | ICD-10-CM | POA: Diagnosis not present

## 2020-04-05 DIAGNOSIS — J449 Chronic obstructive pulmonary disease, unspecified: Secondary | ICD-10-CM | POA: Diagnosis not present

## 2020-04-07 ENCOUNTER — Ambulatory Visit: Payer: Medicare Other | Admitting: Adult Health

## 2020-04-11 DIAGNOSIS — D649 Anemia, unspecified: Secondary | ICD-10-CM | POA: Diagnosis not present

## 2020-04-11 DIAGNOSIS — I447 Left bundle-branch block, unspecified: Secondary | ICD-10-CM | POA: Diagnosis not present

## 2020-04-11 DIAGNOSIS — Z87891 Personal history of nicotine dependence: Secondary | ICD-10-CM | POA: Diagnosis not present

## 2020-04-11 DIAGNOSIS — M545 Low back pain, unspecified: Secondary | ICD-10-CM | POA: Diagnosis not present

## 2020-04-11 DIAGNOSIS — I493 Ventricular premature depolarization: Secondary | ICD-10-CM | POA: Diagnosis not present

## 2020-04-11 DIAGNOSIS — C3491 Malignant neoplasm of unspecified part of right bronchus or lung: Secondary | ICD-10-CM | POA: Diagnosis not present

## 2020-05-11 ENCOUNTER — Ambulatory Visit: Payer: Medicare PPO | Admitting: Internal Medicine

## 2020-05-12 DEATH — deceased

## 2020-05-26 ENCOUNTER — Encounter (INDEPENDENT_AMBULATORY_CARE_PROVIDER_SITE_OTHER): Payer: Self-pay

## 2020-10-28 ENCOUNTER — Encounter: Payer: Medicare PPO | Admitting: Internal Medicine

## 2022-12-31 ENCOUNTER — Encounter: Payer: Self-pay | Admitting: Internal Medicine

## 2023-07-05 ENCOUNTER — Ambulatory Visit: Payer: Medicare PPO | Admitting: Urology
